# Patient Record
Sex: Female | Born: 1950
Health system: Southern US, Community
[De-identification: ages and names within clinical notes are randomized; demographics above are authoritative.]

## PROBLEM LIST (undated history)

## (undated) DIAGNOSIS — J9600 Acute respiratory failure, unspecified whether with hypoxia or hypercapnia: Secondary | ICD-10-CM

## (undated) DIAGNOSIS — E8881 Metabolic syndrome: Secondary | ICD-10-CM

## (undated) DIAGNOSIS — I10 Essential (primary) hypertension: Secondary | ICD-10-CM

## (undated) DIAGNOSIS — J189 Pneumonia, unspecified organism: Secondary | ICD-10-CM

## (undated) DIAGNOSIS — E785 Hyperlipidemia, unspecified: Secondary | ICD-10-CM

## (undated) HISTORY — DX: Essential (primary) hypertension: I10

## (undated) HISTORY — DX: Metabolic syndrome: E88.81

## (undated) HISTORY — DX: Metabolic syndrome: E88.810

## (undated) HISTORY — DX: Hyperlipidemia, unspecified: E78.5

---

## 1999-05-16 ENCOUNTER — Other Ambulatory Visit: Admission: RE | Admit: 1999-05-16 | Discharge: 1999-05-16 | Payer: Self-pay | Admitting: Family Medicine

## 2000-07-16 ENCOUNTER — Other Ambulatory Visit: Admission: RE | Admit: 2000-07-16 | Discharge: 2000-07-16 | Payer: Self-pay | Admitting: Family Medicine

## 2002-12-29 ENCOUNTER — Other Ambulatory Visit: Admission: RE | Admit: 2002-12-29 | Discharge: 2002-12-29 | Payer: Self-pay | Admitting: Family Medicine

## 2012-07-28 ENCOUNTER — Telehealth: Payer: Self-pay | Admitting: Nurse Practitioner

## 2012-08-25 ENCOUNTER — Other Ambulatory Visit: Payer: Self-pay

## 2012-08-25 MED ORDER — LISINOPRIL-HYDROCHLOROTHIAZIDE 20-12.5 MG PO TABS: 1.0000 | ORAL_TABLET | Freq: Every day | ORAL | Status: DC

## 2012-08-25 NOTE — Telephone Encounter (Signed)
Last seen 01/08/12

## 2012-09-03 ENCOUNTER — Ambulatory Visit: Payer: Self-pay | Admitting: Nurse Practitioner

## 2012-09-03 DIAGNOSIS — H6690 Otitis media, unspecified, unspecified ear: Secondary | ICD-10-CM

## 2012-09-03 DIAGNOSIS — J029 Acute pharyngitis, unspecified: Secondary | ICD-10-CM

## 2012-09-03 DIAGNOSIS — R05 Cough: Secondary | ICD-10-CM

## 2012-09-03 DIAGNOSIS — I10 Essential (primary) hypertension: Secondary | ICD-10-CM | POA: Insufficient documentation

## 2012-09-03 LAB — POCT RAPID STREP A (OFFICE): Rapid Strep A Screen: NEGATIVE

## 2012-09-03 MED ORDER — AZITHROMYCIN 250 MG PO TABS: ORAL_TABLET | ORAL | Status: DC

## 2012-09-03 MED ORDER — HYDROCODONE-HOMATROPINE 5-1.5 MG/5ML PO SYRP: 5.0000 mL | ORAL_SOLUTION | Freq: Three times a day (TID) | ORAL | Status: DC | PRN

## 2012-09-03 NOTE — Patient Instructions (Signed)

## 2012-09-03 NOTE — Telephone Encounter (Signed)
Med refilled 08/25/12

## 2012-09-03 NOTE — Progress Notes (Signed)
  Subjective:    [name], female    DOB: 03-Feb-1951, 62 y.o.   MRN: [id number]  HPI Patient speaks no english so Alexa from our office was in room to interpret for patient. She is C/O cough, congestion and headache. No OTC meds. Started 2 days ago and has gotten worse.    Review of Systems  Constitutional: Positive for fever and chills.  HENT: Positive for ear pain, congestion, sore throat, rhinorrhea, postnasal drip and sinus pressure.   Respiratory: Positive for cough (nonproductive).   Cardiovascular: Negative for chest pain, palpitations and leg swelling.  Skin: Negative.        Objective:   Physical Exam  Constitutional: She is oriented to person, place, and time. She appears well-developed and well-nourished.  HENT:  Right Ear: Hearing, external ear and ear canal normal. Tympanic membrane is erythematous.  Left Ear: Hearing, tympanic membrane, external ear and ear canal normal.  Nose: Mucosal edema and rhinorrhea present.  Mouth/Throat: Posterior oropharyngeal edema present.  Cardiovascular: Normal rate, regular rhythm and normal heart sounds.   Pulmonary/Chest: Effort normal and breath sounds normal.  Neurological: She is alert and oriented to person, place, and time.  Skin: Skin is warm.   BP 140/90  Pulse 88  Temp(Src) 100.8 F (38.2 C) (Oral)  Ht 5\' 2"  (1.575 m)  Wt 155 lb (70.308 kg)  BMI 28.34 kg/m2 Results for orders placed in visit on 09/03/12  POCT RAPID STREP A (OFFICE)      Result Value Range   Rapid Strep A Screen Negative  Negative          Assessment & Plan:   1. Sore throat   2. Hypertension   3. OM (otitis media), right   4. Cough    Meds ordered this encounter  Medications  . lisinopril-hydrochlorothiazide (PRINZIDE,ZESTORETIC) 20-12.5 MG per tablet    Sig: Take 1 tablet by mouth daily.  Marland Kitchen azithromycin (ZITHROMAX Z-PAK) 250 MG tablet    Sig: As directed    Dispense:  6 each    Refill:  0    Order Specific Question:   Supervising Provider    Answer:  Ernestina Penna [[id number]]  . HYDROcodone-homatropine (HYCODAN) 5-1.5 MG/5ML syrup    Sig: Take 5 mLs by mouth every 8 (eight) hours as needed for cough.    Dispense:  120 mL    Refill:  0    Order Specific Question:  Supervising Provider    Answer:  Ernestina Penna [[id number]]   1. Take meds as prescribed 2. Use a cool mist humidifier especially during the winter months and when heat has  been humid. 3. Use saline nose sprays frequently 4. Saline irrigations of the nose can be very helpful if done frequently.  * 4X daily for 1 week*  * Use of a nettie pot can be helpful with this. Follow directions with this* 5. Drink plenty of fluids 6. Keep thermostat turn down low 7.For any cough or congestion  Use plain Mucinex- regular strength or max strength is fine   * Children- consult with Pharmacist for dosing 8. For fever or aces or pains- take tylenol or ibuprofen appropriate for age and weight.  * for fevers greater than 101 orally you may alternate ibuprofen and tylenol every  3 hours.   Mary-Margaret Daphine Deutscher, FNP

## 2012-09-04 NOTE — Addendum Note (Signed)
Addended by: Bennie Pierini on: 09/04/2012 12:22 PM   Modules accepted: Level of Service

## 2012-09-23 ENCOUNTER — Ambulatory Visit: Payer: Self-pay | Admitting: Nurse Practitioner

## 2012-09-23 ENCOUNTER — Encounter: Payer: Self-pay | Admitting: Nurse Practitioner

## 2012-09-23 VITALS — BP 125/75 | HR 89 | Temp 98.8°F | Ht 61.0 in | Wt 153.0 lb

## 2012-09-23 DIAGNOSIS — M5432 Sciatica, left side: Secondary | ICD-10-CM

## 2012-09-23 DIAGNOSIS — R079 Chest pain, unspecified: Secondary | ICD-10-CM

## 2012-09-23 DIAGNOSIS — I10 Essential (primary) hypertension: Secondary | ICD-10-CM

## 2012-09-23 DIAGNOSIS — M5430 Sciatica, unspecified side: Secondary | ICD-10-CM

## 2012-09-23 LAB — COMPLETE METABOLIC PANEL WITH GFR
ALT: 21 U/L (ref 0–35)
AST: 23 U/L (ref 0–37)
Alkaline Phosphatase: 53 U/L (ref 39–117)
Creat: 0.7 mg/dL (ref 0.50–1.10)
Total Bilirubin: 0.3 mg/dL (ref 0.3–1.2)

## 2012-09-23 MED ORDER — LISINOPRIL-HYDROCHLOROTHIAZIDE 20-12.5 MG PO TABS: 1.0000 | ORAL_TABLET | Freq: Every day | ORAL | Status: DC

## 2012-09-23 MED ORDER — METHYLPREDNISOLONE ACETATE 80 MG/ML IJ SUSP
80.0000 mg | Freq: Once | INTRAMUSCULAR | Status: AC
  Administered 2012-09-23: 80 mg via INTRAMUSCULAR

## 2012-09-23 NOTE — Patient Instructions (Signed)
Ciática   (Sciatica)   La ciática es el dolor, debilidad, entumecimiento u hormigueo a lo largo del nervio ciático. El nervio comienza en la zona inferior de la espalda y desciende por la parte posterior de cada pierna. El nervio controla los músculos de la parte inferior de la pierna y de la zona posterior de la rodilla, y transmite la sensibilidad a la parte posterior del muslo, la pierna y la planta del pie. La ciática es un síntoma de otras afecciones médicas. Por ejemplo, un daño a los nervios o algunas enfermedades como un disco herniado o un espolón óseo en la columna vertebral, podrían dañarle o presionar en el nervio ciático. Esto causa dolor, debilidad y otras sensaciones normalmente asociadas con la ciática. Generalmente la ciática afecta sólo un lado del cuerpo.  CAUSAS   · Disco herniado o desplazado.  · Enfermedad degenerativa del disco.  · Un síndrome doloroso que compromete un músculo angosto de los glúteos (síndrome piriforme).  · Lesión o fractura pélvica.  · Embarazo.  · Tumor (casos raros).  SÍNTOMAS   Los síntomas pueden variar de leves a muy graves. Por lo general, los síntomas descienden desde la zona lumbar a las nalgas y la parte posterior de la pierna. Ellos son:   · Hormigueo leve o dolor sordo en la parte inferior de la espalda, la pierna o la cadera.  · Adormecimiento en la parte posterior de la pantorrilla o la planta del pie.  · Sensación de quemazón en la zona lumbar, la pierna o la cadera.  · Dolor agudo en la zona inferior de la espalda, la pierna o la cadera.  · Debilidad en las piernas.  · Dolor de espalda intenso que inhibe los movimientos.  Los síntomas pueden empeorar al toser, estornudar, reír o estar sentado o parado durante mucho tiempo. Además, el sobrepeso puede empeorar los síntomas.   DIAGNÓSTICO   Su médico le hará un examen físico para buscar los síntomas comunes de la ciática. Le pedirá que haga algunos movimientos o actividades que activarían el dolor del nervio  ciático. Para encontrar las causas de la ciática podrá indicarle otros estudios. Estos pueden ser:   · Análisis de sangre.  · Radiografías.  · Pruebas de diagnóstico por imágenes, como resonancia magnética o tomografía computada.  TRATAMIENTO   El tratamiento se dirige a las causas de la ciática. A veces, el tratamiento no es necesario, y el dolor y el malestar desaparecen por sí mismos. Si necesita tratamiento, su médico puede sugerir:   · Medicamentos de venta libre para aliviar el dolor.  · Medicamentos recetados, como antiinflamatorios, relajantes musculares o narcóticos.  · Aplicación de calor o hielo en la zona del dolor.  · Inyecciones de corticoides para disminuir el dolor, la irritación y la inflamación alrededor del nervio.  · Reducción de la actividad en los períodos de dolor.  · Ejercicios y estiramiento del abdomen para fortalecer y mejorar la flexibilidad de la columna vertebral. Su médico puede sugerirle perder peso si el peso extra empeora el dolor de espalda.  · Fisioterapia.  · La cirugía para eliminar lo que presiona o pincha el nervio, como un espolón óseo o parte de una hernia de disco.  INSTRUCCIONES PARA EL CUIDADO EN EL HOGAR   · Sólo tome medicamentos de venta libre o recetados para calmar el dolor o el malestar, según las indicaciones de su médico.  · Aplique hielo sobre el área dolorida durante 20 minutos 3-4 veces por día durante los   primeras 48-72 horas. Luego intente aplicar calor de la misma manera.  · Haga ejercicios, elongue o realice sus actividades habituales, si no le causan más dolor.  · Cumpla con todas las sesiones de fisioterapia, según le indique su médico.  · Cumpla con todas las visitas de control, según le indique su médico.  · No use tacones altos o zapatos que no tengan buen apoyo.  · Verifique que el colchón no sea muy blando. Un colchón firme aliviará el dolor y las molestias.  SOLICITE ATENCIÓN MÉDICA DE INMEDIATO SI:   · Pierde el control de la vejiga o del intestino  (incontinencia).  · Aumenta la debilidad en la zona inferior de la espalda, la pelvis, las nalgas o las piernas.  · Siente irritación o inflamación en la espalda.  · Tiene sensación de ardor al orinar.  · El dolor empeora cuando se acuesta o lo despierta por la noche.  · El dolor es peor del que experimentó en el pasado.  · Dura más de 4 semanas.  · Pierde peso sin motivo de manera súbita.  ASEGÚRESE DE QUE:   · Comprende estas instrucciones.  · Controlará su enfermedad.  · Solicitará ayuda de inmediato si no mejora o si empeora.  Document Released: 04/08/2005 Document Revised: 10/08/2011  Exit[employer]® Patient Information ©2014 [employer].

## 2012-09-23 NOTE — Progress Notes (Signed)
  Subjective:    [name], female    DOB: 1950-09-09, 62 y.o.   MRN: [id number]  Leg Pain  The incident occurred more than 1 week ago. There was no injury mechanism. The pain is present in the left leg. The quality of the pain is described as burning. The pain is at a severity of 8/10. The pain is moderate. Exacerbated by: sitting. She has tried nothing for the symptoms.  Hypertension This is a chronic problem. The current episode started more than 1 year ago. The problem has been resolved since onset. The problem is controlled. Associated symptoms include chest pain (every other day) and peripheral edema. Pertinent negatives include no blurred vision, headaches or shortness of breath. There are no associated agents to hypertension. Risk factors for coronary artery disease include obesity and post-menopausal state. Past treatments include ACE inhibitors and diuretics. The current treatment provides significant improvement. There are no compliance problems.       Review of Systems  Eyes: Negative for blurred vision.  Respiratory: Negative for shortness of breath.   Cardiovascular: Positive for chest pain (every other day).  Neurological: Negative for headaches.  All other systems reviewed and are negative.       Objective:   Physical Exam  Constitutional: She is oriented to person, place, and time. She appears well-developed and well-nourished.  HENT:  Nose: Nose normal.  Mouth/Throat: Oropharynx is clear and moist.  Eyes: EOM are normal.  Neck: Trachea normal, normal range of motion and full passive range of motion without pain. Neck supple. No JVD present. Carotid bruit is not present. No thyromegaly present.  Cardiovascular: Normal rate, regular rhythm, normal heart sounds and intact distal pulses.  Exam reveals no gallop and no friction rub.   No murmur heard. Pulmonary/Chest: Effort normal and breath sounds normal.  Abdominal: Soft. Bowel sounds are normal. She  exhibits no distension and no mass. There is no tenderness.  Musculoskeletal: Normal range of motion.  Lymphadenopathy:    She has no cervical adenopathy.  Neurological: She is alert and oriented to person, place, and time. She has normal reflexes.  Skin: Skin is warm and dry.  Psychiatric: She has a normal mood and affect. Her behavior is normal. Judgment and thought content normal.   BP 125/75  Pulse 89  Temp(Src) 98.8 F (37.1 C) (Oral)  Ht 5\' 1"  (1.549 m)  Wt 153 lb (69.4 kg)  BMI 28.92 kg/m2 EKG -NSR       Assessment & Plan:  1. Hypertension *Low NA+ diet** - COMPLETE METABOLIC PANEL WITH GFR - NMR Lipoprofile with Lipids - lisinopril-hydrochlorothiazide (PRINZIDE,ZESTORETIC) 20-12.5 MG per tablet; Take 1 tablet by mouth daily.  Dispense: 30 tablet; Refill: 5  2. Sciatic nerve pain, left REst  Moist heat if helps Motrin OTC - methylPREDNISolone acetate (DEPO-MEDROL) injection 80 mg; Inject 1 mL (80 mg total) into the muscle once.  3. Chest pain Keep diary of episodes- if continues will do cardiology referral - EKG 12-Lead  Mary-Margaret Daphine Deutscher, FNP

## 2012-09-24 ENCOUNTER — Other Ambulatory Visit: Payer: Self-pay | Admitting: Nurse Practitioner

## 2012-09-24 LAB — NMR LIPOPROFILE WITH LIPIDS
Cholesterol, Total: 180 mg/dL (ref <200)
HDL Particle Number: 29.3 umol/L — ABNORMAL LOW (ref >=30.5)
HDL-C: 37 mg/dL — ABNORMAL LOW (ref >=40)
LDL (calc): 116 mg/dL — ABNORMAL HIGH (ref <100)
LDL Size: 20 nm — ABNORMAL LOW (ref >20.5)
LP-IR Score: 55 — ABNORMAL HIGH (ref <=45)
VLDL Size: 45.1 nm (ref <=46.6)

## 2012-09-24 MED ORDER — SIMVASTATIN 40 MG PO TABS: 40.0000 mg | ORAL_TABLET | Freq: Every day | ORAL | Status: DC

## 2012-11-24 ENCOUNTER — Other Ambulatory Visit: Payer: Self-pay | Admitting: Nurse Practitioner

## 2012-12-02 ENCOUNTER — Encounter: Payer: Self-pay | Admitting: *Deleted

## 2013-03-26 ENCOUNTER — Ambulatory Visit (INDEPENDENT_AMBULATORY_CARE_PROVIDER_SITE_OTHER): Payer: Self-pay | Admitting: Nurse Practitioner

## 2013-03-26 VITALS — BP 145/79 | HR 88 | Temp 98.2°F | Ht 61.0 in | Wt 149.0 lb

## 2013-03-26 DIAGNOSIS — J209 Acute bronchitis, unspecified: Secondary | ICD-10-CM

## 2013-03-26 DIAGNOSIS — I10 Essential (primary) hypertension: Secondary | ICD-10-CM

## 2013-03-26 MED ORDER — AZITHROMYCIN 250 MG PO TABS: ORAL_TABLET | ORAL | Status: DC

## 2013-03-26 MED ORDER — LISINOPRIL-HYDROCHLOROTHIAZIDE 20-12.5 MG PO TABS: 1.0000 | ORAL_TABLET | Freq: Every day | ORAL | Status: DC

## 2013-03-26 MED ORDER — HYDROCODONE-HOMATROPINE 5-1.5 MG/5ML PO SYRP: 5.0000 mL | ORAL_SOLUTION | Freq: Three times a day (TID) | ORAL | Status: DC | PRN

## 2013-03-26 NOTE — Progress Notes (Signed)
   Subjective:    [name], female    DOB: [birth year], 62 y.o.   MRN: [id number]  HPI  Patient in to be seen for cold like symptoms- she does not speak english so very difficult to assess.    Review of Systems  Unable to perform ROS: Other  Constitutional: Negative for fever, chills and appetite change.  HENT: Positive for sore throat. Negative for congestion and trouble swallowing.   Respiratory: Positive for cough.   Cardiovascular: Negative.   Gastrointestinal: Negative.   All other systems reviewed and are negative.       Objective:   Physical Exam  Constitutional: She is oriented to person, place, and time. She appears well-developed and well-nourished. No distress.  HENT:  Right Ear: Hearing, tympanic membrane, external ear and ear canal normal.  Left Ear: Hearing, tympanic membrane, external ear and ear canal normal.  Nose: No mucosal edema or rhinorrhea. Right sinus exhibits no maxillary sinus tenderness and no frontal sinus tenderness. Left sinus exhibits no maxillary sinus tenderness and no frontal sinus tenderness.  Mouth/Throat: Uvula is midline, oropharynx is clear and moist and mucous membranes are normal.  Cardiovascular: Normal rate and normal heart sounds.   Pulmonary/Chest: Effort normal and breath sounds normal. She has no wheezes. She has no rales.  Deep wet cough  Abdominal: Soft.  Neurological: She is alert and oriented to person, place, and time.  Skin: Skin is warm. She is not diaphoretic.  Psychiatric: She has a normal mood and affect. Her behavior is normal. Judgment and thought content normal.    BP 145/79  Pulse 88  Temp(Src) 98.2 F (36.8 C) (Oral)  Ht 5\' 1"  (1.549 m)  Wt 149 lb (67.586 kg)  BMI 28.17 kg/m2       Assessment & Plan:   1. Acute bronchitis    Meds ordered this encounter  Medications  . azithromycin (ZITHROMAX Z-PAK) 250 MG tablet    Sig: As directed    Dispense:  6 each    Refill:  0    Order  Specific Question:  Supervising Provider    Answer:  Ernestina Penna [[id number]]  . HYDROcodone-homatropine (HYCODAN) 5-1.5 MG/5ML syrup    Sig: Take 5 mLs by mouth every 8 (eight) hours as needed for cough.    Dispense:  120 mL    Refill:  0    Order Specific Question:  Supervising Provider    Answer:  Ernestina Penna [[id number]]   Force fluids Rest RTO prn  Mary-Margaret Daphine Deutscher, FNP

## 2013-03-26 NOTE — Patient Instructions (Signed)
Bronquitis aguda  ( Acute Bronchitis)  La bronquitis es una inflamación de las vías respiratorias que se extienden desde la tráquea hasta los pulmones (bronquios). La inflamación produce la formación de mucosidad. Esto produce tos, que es el síntoma más frecuente de la bronquitis.   Cuando la bronquitis es aguda, generalmente comienza de manera súbita y desaparece luego de un par de semanas. El hábito de fumar, las alergias y el asma pueden empeorar la bronquitis. Los episodios repetidos de bronquitis pueden causar más problemas pulmonares.   CAUSAS  La causa más frecuente de bronquitis aguda es el mismo virus que produce el resfrío. El virus se propaga de persona a persona (contagioso).   SIGNOS Y SÍNTOMAS   · Tos.    · Fiebre.    · Tos con mucosidad.    · Dolores en el cuerpo.    · Congestión en el pecho.    · Escalofríos.    · Falta de aire.    · Dolor de garganta.    DIAGNÓSTICO   La bronquitis aguda en general se diagnostica con un examen físico. En algunos casos se indican otros estudios, como radiografías, para descartar otras enfermedades.   TRATAMIENTO   La bronquitis aguda generalmente desaparece en un par de semanas. Con frecuencia no es necesario realizar un tratamiento. Los medicamentos se indican para aliviar la fiebre o la tos. Generalmente no es necesario el uso de antibióticos, pero pueden indicarse en ciertas ocasiones. En algunos casos, se recomienda el uso de un inhalador para mejorar la falta de aire y controlar la tos. Un vaporizador de aire frío podrá ayudarlo a disolver las secreciones bronquiales y facilitar su eliminación.   INSTRUCCIONES PARA EL CUIDADO EN EL HOGAR  · Descanse lo suficiente.    · Beba líquidos en abundancia para mantener la orina de color claro o amarillo pálido (excepto que padezca una enfermedad que requiera la restricción de líquidos). Tome mucho líquido para disolver las secreciones y evitar la deshidratación.    · Tome sólo medicamentos de venta libre o recetados,  según las indicaciones del médico.    · Evite fumar o aspirar el humo de otros fumadores. La exposición al humo del cigarrillo o a irritantes químicos hará que la bronquitis empeore. Si fuma, considere el uso de goma de mascar o la aplicación de parches en la piel que contengan nicotina para aliviar los síntomas de abstinencia. Si deja de fumar, sus pulmones se curarán más rápido.    · Reduzca la probabilidad de otro brote de bronquitis aguda lavando sus manos con frecuencia, evitando a las personas que tengan síntomas y tratando de no tocarse las manos con la boca, la nariz o los ojos.    · Concurra a las consultas de control con su médico según las indicaciones.    SOLICITE ATENCIÓN MÉDICA SI:  Los síntomas no mejoran después de 1 semana de tratamiento.   SOLICITE ATENCIÓN MÉDICA DE INMEDIATO SI:  · Comienza a tener fiebre o escalofríos cada vez más intensos.    · Siente dolor en el pecho.    · Le falta el aire de manera preocupante.  · La flema tiene sangre.    · Se deshidrata.  · Se desmaya.  · Tiene vómitos que se repiten.  · Tiene un dolor de cabeza intenso.  ASEGÚRESE DE QUE:   · Comprende estas instrucciones.  · Controlará su afección.  · Recibirá ayuda de inmediato si no mejora o si empeora.  Document Released: 04/08/2005 Document Revised: 12/09/2012  [employer]® Patient Information ©2014 [employer].

## 2013-04-13 ENCOUNTER — Other Ambulatory Visit: Payer: Self-pay | Admitting: Nurse Practitioner

## 2013-04-20 ENCOUNTER — Other Ambulatory Visit: Payer: Self-pay | Admitting: Nurse Practitioner

## 2013-05-04 ENCOUNTER — Ambulatory Visit (INDEPENDENT_AMBULATORY_CARE_PROVIDER_SITE_OTHER): Payer: 59 | Admitting: Nurse Practitioner

## 2013-05-04 ENCOUNTER — Encounter: Payer: Self-pay | Admitting: Nurse Practitioner

## 2013-05-04 VITALS — BP 163/77 | HR 80 | Temp 98.4°F | Ht 61.0 in | Wt 148.0 lb

## 2013-05-04 DIAGNOSIS — Z13820 Encounter for screening for osteoporosis: Secondary | ICD-10-CM

## 2013-05-04 DIAGNOSIS — Z01419 Encounter for gynecological examination (general) (routine) without abnormal findings: Secondary | ICD-10-CM

## 2013-05-04 DIAGNOSIS — Z124 Encounter for screening for malignant neoplasm of cervix: Secondary | ICD-10-CM

## 2013-05-04 DIAGNOSIS — Z0000 Encounter for general adult medical examination without abnormal findings: Secondary | ICD-10-CM

## 2013-05-04 DIAGNOSIS — I10 Essential (primary) hypertension: Secondary | ICD-10-CM

## 2013-05-04 DIAGNOSIS — E785 Hyperlipidemia, unspecified: Secondary | ICD-10-CM | POA: Insufficient documentation

## 2013-05-04 LAB — POCT CBC
GRANULOCYTE PERCENT: 69.4 % (ref 37–80)
HEMATOCRIT: 40.4 % (ref 37.7–47.9)
Hemoglobin: 12.8 g/dL (ref 12.2–16.2)
Lymph, poc: 1.9 (ref 0.6–3.4)
MCH, POC: 28.2 pg (ref 27–31.2)
MCHC: 31.7 g/dL — AB (ref 31.8–35.4)
MCV: 89 fL (ref 80–97)
MPV: 9.7 fL (ref 0–99.8)
POC GRANULOCYTE: 5.8 (ref 2–6.9)
POC LYMPH %: 22.5 % (ref 10–50)
Platelet Count, POC: 134 10*3/uL — AB (ref 142–424)
RBC: 4.5 M/uL (ref 4.04–5.48)
RDW, POC: 13.3 %
WBC: 8.4 10*3/uL (ref 4.6–10.2)

## 2013-05-04 LAB — POCT URINALYSIS DIPSTICK
Bilirubin, UA: NEGATIVE
Glucose, UA: NEGATIVE
Ketones, UA: NEGATIVE
NITRITE UA: NEGATIVE
PH UA: 5
Protein, UA: NEGATIVE
SPEC GRAV UA: 1.01
UROBILINOGEN UA: NEGATIVE

## 2013-05-04 LAB — POCT UA - MICROSCOPIC ONLY
CASTS, UR, LPF, POC: NEGATIVE
Crystals, Ur, HPF, POC: NEGATIVE
MUCUS UA: NEGATIVE
Yeast, UA: NEGATIVE

## 2013-05-04 MED ORDER — LISINOPRIL-HYDROCHLOROTHIAZIDE 20-12.5 MG PO TABS: 1.0000 | ORAL_TABLET | Freq: Every day | ORAL | Status: DC

## 2013-05-04 NOTE — Progress Notes (Signed)
   Subjective:    [name], female    DOB: 04-10-1951, 63 y.o.   MRN: [id number]  HPI  Patoent here today for CPE and PAP- she has hypertension and takes lisinopril/hctz 20/12.5 daily- she also has hyperlipidemia but says she is not taking- she is very difficult to assess because she speaks no english and there is no interpreter to translate.    Review of Systems  Unable to perform ROS: Other       Objective:   Physical Exam  Constitutional: She is oriented to person, place, and time. She appears well-developed and well-nourished.  HENT:  Head: Normocephalic.  Right Ear: Hearing, tympanic membrane, external ear and ear canal normal.  Left Ear: Hearing, tympanic membrane, external ear and ear canal normal.  Nose: Nose normal.  Mouth/Throat: Uvula is midline and oropharynx is clear and moist.  Eyes: Conjunctivae and EOM are normal. Pupils are equal, round, and reactive to light.  Neck: Normal range of motion and full passive range of motion without pain. Neck supple. No JVD present. Carotid bruit is not present. No mass and no thyromegaly present.  Cardiovascular: Normal rate, normal heart sounds and intact distal pulses.   No murmur heard. Pulmonary/Chest: Effort normal and breath sounds normal. Right breast exhibits no inverted nipple, no mass, no nipple discharge, no skin change and no tenderness. Left breast exhibits no inverted nipple, no mass, no nipple discharge, no skin change and no tenderness.  Abdominal: Soft. Bowel sounds are normal. She exhibits no mass. There is no tenderness.  Genitourinary: Vagina normal and uterus normal. Guaiac negative stool. No breast swelling, tenderness, discharge or bleeding.  bimanual exam-No adnexal masses or tenderness. Cervix nonparous and pink  Musculoskeletal: Normal range of motion.  Lymphadenopathy:    She has no cervical adenopathy.  Neurological: She is alert and oriented to person, place, and time.  Skin: Skin is  warm and dry.  Psychiatric: She has a normal mood and affect. Her behavior is normal. Judgment and thought content normal.     BP 163/77  Pulse 80  Temp(Src) 98.4 F (36.9 C) (Oral)  Ht _0  (1.549 m)  Wt 148 lb (67.132 kg)  BMI 27.98 kg/m2      Assessment & Plan:   1. Annual physical exam   2. Hyperlipidemia LDL goal < 100   3. Hypertension   4. Encounter for routine gynecological examination    Orders Placed This Encounter  Procedures  . Urine culture  . CMP14+EGFR  . NMR, lipoprofile  . Thyroid Panel With TSH  . POCT UA - Microscopic Only  . POCT urinalysis dipstick  . POCT CBC   Meds ordered this encounter  Medications  . lisinopril-hydrochlorothiazide (PRINZIDE,ZESTORETIC) 20-12.5 MG per tablet    Sig: Take 1 tablet by mouth daily.    Dispense:  30 tablet    Refill:  5    Order Specific Question:  Supervising Provider    Answer:  Chipper Herb [[id number]]   Patient will make appointment for mammo Schedule bone density test Health maintenance reviewed Diet and exercise encouraged Continue all meds Follow up  In 6 months   [place], FNP

## 2013-05-04 NOTE — Patient Instructions (Signed)
Mantenimiento de Fish farm manager las mujeres (Health Maintenance, Females) Un estilo de vida saludable y los cuidados preventivos pueden favorecer la salud y [place].   Haga exmenes regulares de la salud en general, dentales y de los ojos.  Consuma una dieta saludable. Los [employer], frutas, granos enteros, productos lcteos descremados y protenas magras contienen los nutrientes que usted necesita sin necesidad de consumir muchas caloras. Disminuya el consumo de alimentos con alto contenido de grasas slidas, azcar y sal agregadas. Si es necesario, pdale informacin acerca de [place] a su mdico.  La actividad fsica regular es una de las cosas ms importantes que puede hacer por su salud. Los adultos deben hacer al menos 150 minutos de ejercicios de intensidad moderada (cualquier actividad que aumente la frecuencia cardaca y lo haga transpirar) cada semana. Adems, la [employer] de los adultos necesita ejercicios de fortalecimiento muscular 2  ms [employer].   Mantenga un peso saludable. El ndice de masa corporal [facility]) es una herramienta que identifica posibles problemas con [place]. Proporciona una estimacin de la grasa corporal basndose en el peso y la altura. El mdico podr determinar su [facility] y podr ayudarlo a Scientist, forensic o Theatre manager un peso saludable. Para los adultos de 20 aos o ms:  Un [facility] menor a 18,5 se considera bajo peso.  Un [facility] entre 18,5 y 24,9 es normal.  Un [facility] entre 25 y 29,9 es sobrepeso.  Un IMC entre 30 o ms es obesidad.  Mantenga un nivel normal de lpidos y colesterol en sangre practicando actividad fsica y minimizando la ingesta de grasas saturadas. Consuma una dieta balanceada e incluya variedad de frutas y vegetales. Los [employer] de lpidos y Research officer, trade union en sangre deben Medical laboratory scientific officer a los 60 aos y repetirse cada 5 aos. Si los niveles de colesterol son altos, tiene ms de 50 aos o tiene riesgo elevado de sufrir enfermedades cardacas,  Designer, industrial/product controlarse con ms frecuencia.Si tiene [employer] de lpidos y colesterol, debe recibir tratamiento con medicamentos, si la dieta y el ejercicio no son efectivos.  Si fuma, consulte con [employer] de las opciones para dejar de [place]. Si no lo hace, no comience.  Se recomienda realizar controles para el cncer de pulmn a los [employer] de 60 a 53 aos que tengan alto riesgo de Actor la enfermedad debido a sus antecedentes de tabaquismo. Se recomienda realizar una tomografa computarizada (TC) anual de dosis bajas en aquellas personas tienen un promedio de 30 paquetes al ao de historia de tabaquismo y en la actualidad fuman o han dejado de fumar dentro de los ltimos 15 aos. Un paquete al ao de fumador es fumar un promedio de 1 paquete de cigarrillos al da durante 1 ao (por ejemplo: 1 paquete al da durante [place] 15 aos). El control anual debe continuar hasta que el fumador haya dejado de fumar durante al menos 15 aos. Screening anual debe interrumpirse en aquellas personas que desarrollan un problema de salud que les impida seguir un tratamiento para el cncer de pulmn.  Si est embarazada no beba alcohol. Si est amamantando, beba alcohol con prudencia. Si elige beber alcohol, no se exceda de 1 medida por da. Se considera una medida a 12 onzas (355 ml) de cerveza, 5 onzas (148 ml) de vino, o 1,5 onzas (44 ml) de licor.  No comparta agujas con nadie. Pida ayuda si necesita asistencia o instrucciones con respecto a abandonar el consumo de  alcohol, cigarrillos o drogas.  La hipertensin arterial causa enfermedades cardacas y [place] el riesgo de ictus. Debe controlar su presin arterial al menos cada 1 o 2 aos. La presin arterial elevada que persiste debe tratarse con medicamentos si la prdida de peso y el ejercicio no son efectivos.  Si tiene entre 97 y 57 aos, consulte a su mdico si debe tomar aspirina para prevenir  enfermedades cardacas.  Los anlisis para la diabetes incluyen la toma de [place] de sangre para controlar el nivel de azcar en la sangre durante el [place]. Debe hacerlo cada 3 aos despus de los 65 aos si est dentro de su peso normal y sin factores de riesgo para la diabetes. Las pruebas deben comenzar a edades tempranas o llevarse a cabo con ms frecuencia si tiene sobrepeso y al menos 1 factor de riesgo para la diabetes.  Las evaluaciones para Public affairs consultant de mama son un mtodo preventivo fundamental para las mujeres. Debe practicar la "autoconciencia de las mamas". Esto significa que Chief Technology Officer apariencia normal de sus mamas y [place] y pudiendo incluir un autoexamen de Glass blower/designer. Si detecta algn cambio, no importa cun pequeo sea, debe informarlo a su mdico. Las mujeres entre 20 y 108 aos deben hacer un examen clnico de las mamas como parte del examen regular de [place], cada 1 a 3 aos. Despus de los 40 aos deben [employer]. Deben hacerse una mamografa radografa de mamas ) cada ao, comenzando a los 40 aos. Las mujeres con Editor, commissioning de cncer de mama deben hablar con el mdico para hacer un estudio gentico. Las que tienen ms riesgo deben Electrical engineer resonancia magntica y [employer] todos [place].  La evaluacin del riesgo de sufrir cncer relacionado con el cncer de mama gentico (BRCA) se recomienda en aquellas mujeres que tienen familiares con cncer relacionados con el BRCA El cncer relacionado con BRCA incluye el cncer de mama, de ovario, de trompas y peritoneal. El hecho de tener familiares con estos tipos de cncer puede asociarse con un mayor riesgo de a sufrir cambios perjudiciales (mutaciones) en los genes para el cncer de mama BRCA1 y BRCA2. Los resultados de la evaluacin determinar la necesidad de asesoramiento gentico BRCA1 y BRCA2 y [place]. Los resultados de la evaluacin determinarn la necesidad de asesoramiento gentico y  estudios para el BRCA1 y BRCA2.  Un Papanicolau se realiza para diagnosticar cncer de cuello de tero. Las mujeres deben hacerse un Papanicolau a Proofreader de los 21 aos. [employer] 21 y los 29 aos debe repetirse [place]. Luego de los 30 aos, debe realizarse un Papanicolau cada tres aos siempre que los 3 estudios anteriores sean normales. Si le han realizado una histerectoma por un problema que no era cncer u otra enfermedad que podra causar cncer, ya no necesitar un Papanicolau. Si tiene entre 45 y 36 aos y ha tenido Engineer, production normal en los ltimos 10 aos, ya no ser IT sales professional. Si ha recibido un tratamiento para Science writer cervical o para una enfermedad que podra causar cncer, Dietitian un Papanicolau y controles durante al menos 63 aos de concluir el [place]. Si no se ha Futures trader con regularidad, Chief Executive Officer a evaluarse los factores de riesgo (como el tener un nuevo compaero sexual) para Programmer, applications a Actuary. Algunas mujeres sufren problemas mdicos que aumentan la probabilidad de Optician, dispensing cervical. En estos casos, el mdico podr indicar que se  realice el Papanicolau con ms frecuencia.  La prueba del virus del papiloma humano (VPH) es un anlisis adicional que puede usarse para detectar cncer de cuello de tero. Esta prueba busca la presencia del virus que causa los cambios en el cuello. Las clulas que se recolectan durante el Papanicolau pueden usarse para el VPH. La prueba para el VPH puede usarse para evaluar a mujeres de ms de 30 aos y debe usarse en mujeres de cualquier edad cuyos resultados del Papanicolau no sean claros. Despus de los 30 aos, las mujeres deben hacerse el anlisis para el VPH con la misma frecuencia que el [place].  El cncer colorectal puede detectarse y con frecuencia puede prevenirse. La mayor parte de los estudios de rutina comienzan a los 50 aos y continan hasta los 75  aos. Sin embargo, el mdico podr aconsejarle que lo haga antes, si tiene factores de riesgo para el cncer de colon. Una vez por ao, el profesional le dar un kit de prueba para hallar sangre oculta en la materia fecal. La utilizacin de un tubo con una pequea cmara en su extremo para examinar directamente el colon (sigmoidoscopa o colonoscopa), puede detectar formas temprana de cncer colorectal. Hable con su mdico si tiene 50 aos, cuando comience con los estudios de rutina. El examen directo del colon debe repetirse cada 5 a 10 aos, hasta los 75 aos, excepto que se encuentren formas tempranas de plipos precancerosos o pequeos bultos.  Se recomienda realizar un anlisis de sangre para detectar hepatitis C a todas las personas nacidas entre 1945 y 1965, y a todo aquel que tenga un riesgo conocido de haber contrado esta enfermedad.  Practique el sexo seguro. Use condones y evite las prcticas sexuales riesgosas para disminuir el contagio de enfermedades de transmisin sexual. Las mujeres sexualmente activas de 25 aos o menos deben controlarse para descartar clamidia, que es una infeccin de transmisin sexual frecuente. Las mujeres mayores que tengan mltiples compaeros tambin deben hacerse el anlisis para detectar clamidia. Se recomienda realizar anlisis para detectar otras enfermedades de transmisin sexual si es sexualmente activa y tiene riesgos.  La osteoporosis es una enfermedad en la que los huesos pierden los minerales y la fuerza por el avance de la edad. El resultado pueden ser fracturas graves en los huesos. El riesgo de osteoporosis puede identificarse con una prueba de densidad sea. Las mujeres de ms de 65 aos y las que tengan riesgos de sufrir fracturas u osteoporosis deben pedir consejo a su mdico. Consulte a su mdico si debe tomar un suplemento de calcio o de vitamina D para reducir el riesgo de osteoporosis.  La menopausia se asocia a sntomas y riesgos fsicos. Se  dispone de una terapia de reemplazo hormonal para disminuir los sntomas y los riesgos. Consulte a su mdico para saber si la terapia de reemplazo hormonal es conveniente para usted.  Use una pantalla solar. Aplique pantalla de manera libre y repetida a lo largo del da. Pngase al resguardo del sol cuando la sombra sea ms pequea que usted. Protjase usando mangas y pantalones largos, un sombrero de ala ancha y gafas para el sol todo el ao, siempre que se encuentre en el exterior.  Informe a su mdico si aparecen nuevos lunares o los que tiene se modifican, especialmente en forma y color. Tambin notifique al mdico si un lunar es ms grande que el tamao de una goma de lpiz.  Mantngase al da con las vacunas. Document Released: 03/28/2011 Document Revised: 08/03/2012 ExitCare Patient   Information 2014 ExitCare[employer] LLC.  

## 2013-05-05 LAB — CMP14+EGFR
ALBUMIN: 4.2 g/dL (ref 3.6–4.8)
ALT: 19 IU/L (ref 0–32)
AST: 21 IU/L (ref 0–40)
Albumin/Globulin Ratio: 1.7 (ref 1.1–2.5)
Alkaline Phosphatase: 64 IU/L (ref 39–117)
BUN/Creatinine Ratio: 20 (ref 11–26)
BUN: 15 mg/dL (ref 8–27)
CALCIUM: 9.7 mg/dL (ref 8.6–10.2)
CO2: 24 mmol/L (ref 18–29)
Chloride: 100 mmol/L (ref 97–108)
Creatinine, Ser: 0.74 mg/dL (ref 0.57–1.00)
GFR, EST AFRICAN AMERICAN: 100 mL/min/{1.73_m2} (ref >59)
GFR, EST NON AFRICAN AMERICAN: 87 mL/min/{1.73_m2} (ref >59)
GLUCOSE: 94 mg/dL (ref 65–99)
Globulin, Total: 2.5 g/dL (ref 1.5–4.5)
POTASSIUM: 4 mmol/L (ref 3.5–5.2)
Sodium: 142 mmol/L (ref 134–144)
Total Bilirubin: 0.3 mg/dL (ref 0.0–1.2)
Total Protein: 6.7 g/dL (ref 6.0–8.5)

## 2013-05-05 LAB — PAP IG W/ RFLX HPV ASCU: PAP Smear Comment: 0

## 2013-05-05 LAB — THYROID PANEL WITH TSH
FREE THYROXINE INDEX: 1.7 (ref 1.2–4.9)
T3 Uptake Ratio: 23 % — ABNORMAL LOW (ref 24–39)
T4 TOTAL: 7.5 ug/dL (ref 4.5–12.0)
TSH: 3.83 u[IU]/mL (ref 0.450–4.500)

## 2013-05-05 LAB — NMR, LIPOPROFILE
CHOLESTEROL: 192 mg/dL (ref <200)
HDL CHOLESTEROL BY NMR: 41 mg/dL (ref >=40)
HDL Particle Number: 29.3 umol/L — ABNORMAL LOW (ref >=30.5)
LDL PARTICLE NUMBER: 1620 nmol/L — AB (ref <1000)
LDL Size: 20.1 nm — ABNORMAL LOW (ref >20.5)
LDLC SERPL CALC-MCNC: 118 mg/dL — ABNORMAL HIGH (ref <100)
LP-IR Score: 59 — ABNORMAL HIGH (ref <=45)
Small LDL Particle Number: 1031 nmol/L — ABNORMAL HIGH (ref <=527)
TRIGLYCERIDES BY NMR: 166 mg/dL — AB (ref <150)

## 2013-05-05 LAB — URINE CULTURE

## 2013-05-06 ENCOUNTER — Other Ambulatory Visit: Payer: Self-pay | Admitting: Nurse Practitioner

## 2013-05-06 MED ORDER — ATORVASTATIN CALCIUM 40 MG PO TABS: 40.0000 mg | ORAL_TABLET | Freq: Every day | ORAL | Status: DC

## 2013-05-13 ENCOUNTER — Ambulatory Visit (INDEPENDENT_AMBULATORY_CARE_PROVIDER_SITE_OTHER): Payer: 59 | Admitting: Nurse Practitioner

## 2013-05-13 ENCOUNTER — Encounter (INDEPENDENT_AMBULATORY_CARE_PROVIDER_SITE_OTHER): Payer: Self-pay

## 2013-05-13 ENCOUNTER — Encounter: Payer: Self-pay | Admitting: Nurse Practitioner

## 2013-05-13 VITALS — BP 172/85 | HR 82 | Temp 98.5°F | Ht 61.0 in | Wt 146.0 lb

## 2013-05-13 DIAGNOSIS — J208 Acute bronchitis due to other specified organisms: Secondary | ICD-10-CM

## 2013-05-13 DIAGNOSIS — J209 Acute bronchitis, unspecified: Secondary | ICD-10-CM

## 2013-05-13 MED ORDER — HYDROCODONE-HOMATROPINE 5-1.5 MG/5ML PO SYRP: 5.0000 mL | ORAL_SOLUTION | Freq: Three times a day (TID) | ORAL | Status: DC | PRN

## 2013-05-13 NOTE — Progress Notes (Signed)
   Subjective:    [name], female    DOB: 03-Dec-1950, 63 y.o.   MRN: [id number]  HPI Patient in c/o cough that started Tuesday- no better. Patient speaks no english and no one to interpret for us0- very difficult to assess.    Review of Systems  Constitutional: Negative for fever, chills and appetite change.  HENT: Positive for congestion.   Respiratory: Positive for cough (nonproductive).   Cardiovascular: Negative.   Gastrointestinal: Negative.   Neurological: Negative.   All other systems reviewed and are negative.       Objective:   Physical Exam  Constitutional: She is oriented to person, place, and time. She appears well-developed and well-nourished.  HENT:  Right Ear: Hearing, tympanic membrane, external ear and ear canal normal.  Left Ear: Hearing, tympanic membrane, external ear and ear canal normal.  Nose: Mucosal edema present.  Mouth/Throat: Mucous membranes are normal.  Cardiovascular: Normal rate and normal heart sounds.   Pulmonary/Chest: Effort normal and breath sounds normal.  Neurological: She is alert and oriented to person, place, and time.  Skin: Skin is warm.  Psychiatric: She has a normal mood and affect. Her behavior is normal. Judgment and thought content normal.   BP 172/85  Pulse 82  Temp(Src) 98.5 F (36.9 C) (Oral)  Ht 5\' 1"  (1.549 m)  Wt 146 lb (66.225 kg)  BMI 27.60 kg/m2        Assessment & Plan:   1. Viral bronchitis    Meds ordered this encounter  Medications  . HYDROcodone-homatropine (HYCODAN) 5-1.5 MG/5ML syrup    Sig: Take 5 mLs by mouth every 8 (eight) hours as needed for cough.    Dispense:  120 mL    Refill:  0    Order Specific Question:  Supervising Provider    Answer:  Chipper Herb [[id number]]   1. Take meds as prescribed 2. Use a cool mist humidifier especially during the winter months and when heat has been humid. 3. Use saline nose sprays frequently 4. Saline irrigations of the nose can be  very helpful if done frequently.  * 4X daily for 1 week*  * Use of a nettie pot can be helpful with this. Follow directions with this* 5. Drink plenty of fluids 6. Keep thermostat turn down low 7.For any cough or congestion  Use plain Mucinex- regular strength or max strength is fine   * Children- consult with Pharmacist for dosing 8. For fever or aces or pains- take tylenol or ibuprofen appropriate for age and weight.  * for fevers greater than 101 orally you may alternate ibuprofen and tylenol every  3 hours.   Mary-Margaret Hassell Done, FNP

## 2013-05-13 NOTE — Patient Instructions (Signed)
Bronquitis  (Bronchitis)  La bronquitis es una inflamación de las vías respiratorias que se extienden desde la tráquea hasta los pulmones (bronquios). A menudo, la inflamación produce la formación de mucosidad, lo que genera tos. Si la inflamación es grave, puede provocar falta de aire.  CAUSAS   Las causas de la bronquitis pueden ser:   · Infecciones virales.  · Bacterias.  · Humo del cigarrillo.  · Alérgenos, contaminantes y otros irritantes.  SIGNOS Y SÍNTOMAS   El síntoma más habitual de la bronquitis es la tos frecuente con mucosidad. Otros síntomas son:  · Fiebre.  · Dolores en el cuerpo.  · Congestión en el pecho.  · Escalofríos.  · Falta de aire.  · Dolor de garganta.  DIAGNÓSTICO   La bronquitis en general se diagnostica con la historia clínica y un examen físico. En algunos casos se indican otros estudios, como radiografías, para descartar otras enfermedades.   TRATAMIENTO   Tal vez deba evitar el contacto con la causa del problema (por ejemplo, el cigarrillo). En algunos casos es necesario administrar medicamentos. Estos pueden ser:  · Antibióticos. Tal vez se los receten si la causa de la bronquitis es una bacteria.  · Antitusivos. Tal vez se los receten para aliviar los síntomas de la tos.  · Medicamentos inhalados. Tal vez se los receten para liberar las vías respiratorias y facilitar la respiración.  · Medicamentos con corticoides. Tal vez se los receten si tiene bronquitis recurrente (crónica).  INSTRUCCIONES PARA EL CUIDADO EN EL HOGAR  · Descanse lo suficiente.  · Beba líquidos en abundancia para mantener la orina de color claro o amarillo pálido (excepto que padezca una enfermedad que requiera la restricción de líquidos). Tome mucho líquido para disolver las secreciones y evitar la deshidratación.  · Tome solo medicamentos de venta libre o recetados, según las indicaciones del médico.  · Tome los antibióticos exclusivamente según las indicaciones. Finalice la prescripción completa, aunque se  sienta mejor.  · Evite el humo de segunda mano, los químicos irritantes y los vapores fuertes. Estos agentes empeoran la bronquitis. Si es fumador, abandone el hábito. Considere el uso de goma de mascar o la aplicación de parches en la piel que contengan nicotina para aliviar los síntomas de abstinencia. Si deja de fumar, sus pulmones se curarán más rápido.  · Ponga un humidificador de vapor frío en la habitación por la noche para humedecer el aire. Puede ayudarlo a aflojar la mucosidad. Cambie el agua del humidificador a diario. También puede abrir el agua caliente de la ducha y sentarse en el baño con la puerta cerrada durante 5 a 10 minutos.  · Concurra a las consultas de control con su médico según las indicaciones.  · Lávese las manos con frecuencia para evitar contagiarse bronquitis nuevamente y para no extender la infección a otras personas.  SOLICITE ATENCIÓN MÉDICA SI:  Los síntomas no mejoran después de 1 semana de tratamiento.   SOLICITE ATENCIÓN MÉDICA DE INMEDIATO SI:  · La fiebre aumenta.  · Tiene escalofríos.  · Siente dolor en el pecho.  · Le empeora la falta el aire.  · La flema tiene sangre.  · Se desmaya.  · Tiene vahídos.  · Sufre un dolor intenso de cabeza.  · Vomita repetidas veces.  ASEGÚRESE DE QUE:   · Comprende estas instrucciones.  · Controlará su afección.  · Recibirá ayuda de inmediato si no mejora o si empeora.  Document Released: 04/08/2005 Document Revised: 01/27/2013  ExitCare® Patient Information ©2014 ExitCare, LLC.

## 2013-05-26 ENCOUNTER — Encounter: Payer: Self-pay | Admitting: Pharmacist

## 2013-05-26 ENCOUNTER — Ambulatory Visit (INDEPENDENT_AMBULATORY_CARE_PROVIDER_SITE_OTHER): Payer: 59 | Admitting: Pharmacist

## 2013-05-26 ENCOUNTER — Ambulatory Visit (INDEPENDENT_AMBULATORY_CARE_PROVIDER_SITE_OTHER): Payer: 59

## 2013-05-26 VITALS — Ht 60.5 in | Wt 145.5 lb

## 2013-05-26 DIAGNOSIS — Z13820 Encounter for screening for osteoporosis: Secondary | ICD-10-CM

## 2013-05-26 NOTE — Progress Notes (Signed)
[name], female   DOB: 23-Oct-1950, 63 y.o.   MRN: [id number] [facility] Current Height: Height: 5' 0.5" (153.7 cm)      Max Lifetime Height:  5\' 1"  Current Weight: Weight: 145 lb 8 oz (65.998 kg)       Ethnicity: hispanic    HPI: Does pt already have a diagnosis of:  Osteopenia?  No Osteoporosis?  No  Back Pain?  No       Kyphosis?  No Prior fracture?  Yes - ankle Med(s) for Osteoporosis/Osteopenia:  none Med(s) previously tried for Osteoporosis/Osteopenia:  none                                                             PMH: Age at menopause:  UTD Hysterectomy?  No Oophorectomy?  No HRT? No Steroid Use?  No Thyroid med?  No History of cancer?  No History of digestive disorders (ie Crohn's)?  No Current or previous eating disorders?  No Last Vitamin D Result:  50.3 (02/2010) Last GFR Result:  87 (05/04/2013)   FH/SH: Family history of osteoporosis?  No Parent with history of hip fracture?  No Family history of breast cancer?  No Exercise?  Yes   Smoking?  No Alcohol?  No    Calcium Assessment Calcium Intake  # of servings/day  Calcium mg  Milk (8 oz) 1  x  300  = 300mg   Yogurt (4 oz) 1 x  200 = 200mg   Cheese (1 oz) 0 x  200 =   Other Calcium sources   250mg   Ca supplement 0 = 0   Estimated calcium intake per day 750mg     DEXA Results Date of Test T-Score for AP Spine L1-L4 T-Score for Total Left Hip T-Score for Total Right Hip  05/26/2013 0.4 0.8 0.9  08/23/2009 0.3 0.8 0.9             Assessment: Normal BMD  Recommendations: 1.  Discussed DEXA results and fracture risk with patient and daughter 2.  recommend calcium 1200mg  daily through supplementation or diet.  3.  continue weight bearing exercise - 30 minutes at least 4 days  per week.   4.  Counseled and educated about fall risk and prevention.  Recheck DEXA:  3 years  Time spent counseling patient:  15 minutes  Cherre Robins, PharmD, CPP

## 2013-06-12 ENCOUNTER — Other Ambulatory Visit: Payer: Self-pay | Admitting: Nurse Practitioner

## 2013-08-16 ENCOUNTER — Ambulatory Visit (INDEPENDENT_AMBULATORY_CARE_PROVIDER_SITE_OTHER): Payer: 59 | Admitting: Family Medicine

## 2013-08-16 VITALS — BP 138/85 | HR 85 | Temp 98.8°F | Ht 60.5 in | Wt 148.4 lb

## 2013-08-16 DIAGNOSIS — H109 Unspecified conjunctivitis: Secondary | ICD-10-CM

## 2013-08-16 DIAGNOSIS — J209 Acute bronchitis, unspecified: Secondary | ICD-10-CM

## 2013-08-16 MED ORDER — ALBUTEROL SULFATE HFA 108 (90 BASE) MCG/ACT IN AERS: 2.0000 | INHALATION_SPRAY | Freq: Four times a day (QID) | RESPIRATORY_TRACT | Status: DC | PRN

## 2013-08-16 MED ORDER — HYDROCODONE-HOMATROPINE 5-1.5 MG/5ML PO SYRP: 5.0000 mL | ORAL_SOLUTION | Freq: Three times a day (TID) | ORAL | Status: DC | PRN

## 2013-08-16 MED ORDER — METHYLPREDNISOLONE ACETATE 80 MG/ML IJ SUSP
80.0000 mg | Freq: Once | INTRAMUSCULAR | Status: AC
  Administered 2013-08-16: 80 mg via INTRAMUSCULAR

## 2013-08-16 MED ORDER — AZITHROMYCIN 250 MG PO TABS: ORAL_TABLET | ORAL | Status: DC

## 2013-08-16 MED ORDER — POLYMYXIN B-TRIMETHOPRIM 10000-0.1 UNIT/ML-% OP SOLN: 2.0000 [drp] | OPHTHALMIC | Status: DC

## 2013-08-16 NOTE — Progress Notes (Signed)
   Subjective:    [name], female    DOB: 1950/11/07, 63 y.o.   MRN: [id number]  HPI This 63 y.o. female presents for evaluation of URI and cough.  She has been having bilateral Eye drainage and her eyes are matted shut in the am.   Review of Systems C/o eye drainage and uri sx's No chest pain, SOB, HA, dizziness, vision change, N/V, diarrhea, constipation, dysuria, urinary urgency or frequency, myalgias, arthralgias or rash.     Objective:   Physical Exam  Vital signs noted  Well developed well nourished female.  HEENT - Head atraumatic Normocephalic                Eyes - PERRLA, Conjuctiva - injected OU Sclera- Clear EOMI                Ears - EAC's Wnl TM's Wnl Gross Hearing WNL                Nose - Nares patent                 Throat - oropharanx wnl Respiratory - Lungs CTA bilateral Cardiac - RRR S1 and S2 without murmur GI - Abdomen soft Nontender and bowel sounds active x 4 Extremities - No edema. Neuro - Grossly intact.      Assessment & Plan:  Acute bronchitis - Plan: albuterol (PROVENTIL HFA;VENTOLIN HFA) 108 (90 BASE) MCG/ACT inhaler, azithromycin (ZITHROMAX Z-PAK) 250 MG tablet, HYDROcodone-homatropine (HYCODAN) 5-1.5 MG/5ML syrup, methylPREDNISolone acetate (DEPO-MEDROL) injection 80 mg  Conjunctivitis - Plan: trimethoprim-polymyxin b (POLYTRIM) ophthalmic solution  Push po fluids, rest, tylenol and motrin otc prn as directed for fever, arthralgias, and myalgias.  Follow up prn if sx's continue or persist.  Lysbeth Penner FNP

## 2013-11-01 ENCOUNTER — Encounter: Payer: Self-pay | Admitting: Nurse Practitioner

## 2013-11-01 ENCOUNTER — Ambulatory Visit (INDEPENDENT_AMBULATORY_CARE_PROVIDER_SITE_OTHER): Payer: 59 | Admitting: Nurse Practitioner

## 2013-11-01 VITALS — BP 140/80 | HR 86 | Temp 98.6°F | Ht 65.5 in | Wt 152.0 lb

## 2013-11-01 DIAGNOSIS — I10 Essential (primary) hypertension: Secondary | ICD-10-CM

## 2013-11-01 DIAGNOSIS — E785 Hyperlipidemia, unspecified: Secondary | ICD-10-CM

## 2013-11-01 MED ORDER — ATORVASTATIN CALCIUM 40 MG PO TABS: 40.0000 mg | ORAL_TABLET | Freq: Every day | ORAL | Status: DC

## 2013-11-01 MED ORDER — LISINOPRIL-HYDROCHLOROTHIAZIDE 20-12.5 MG PO TABS: 1.0000 | ORAL_TABLET | Freq: Every day | ORAL | Status: DC

## 2013-11-01 NOTE — Progress Notes (Signed)
Subjective:    [name], female    DOB: [birth year], 63 y.o.   MRN: [id number]  Hypertension This is a chronic problem. The current episode started more than 1 year ago. The problem has been resolved since onset. The problem is controlled. Associated symptoms include peripheral edema. Pertinent negatives include no blurred vision, chest pain (every other day), headaches or shortness of breath. There are no associated agents to hypertension. Risk factors for coronary artery disease include obesity and post-menopausal state. Past treatments include ACE inhibitors and diuretics. The current treatment provides significant improvement. There are no compliance problems.   Hyperlipidemia This is a chronic problem. The current episode started more than 1 year ago. The problem is uncontrolled. Recent lipid tests were reviewed and are low. She has no history of diabetes, hypothyroidism or obesity. Factors aggravating her hyperlipidemia include thiazides. Pertinent negatives include no chest pain (every other day) or shortness of breath. Current antihyperlipidemic treatment includes statins. The current treatment provides moderate improvement of lipids. Compliance problems include adherence to diet and adherence to exercise.  Risk factors for coronary artery disease include dyslipidemia, family history, hypertension and post-menopausal.      Review of Systems  Eyes: Negative for blurred vision.  Respiratory: Negative for shortness of breath.   Cardiovascular: Negative for chest pain (every other day).  Neurological: Negative for headaches.  All other systems reviewed and are negative.      Objective:   Physical Exam  Constitutional: She is oriented to person, place, and time. She appears well-developed and well-nourished.  HENT:  Nose: Nose normal.  Mouth/Throat: Oropharynx is clear and moist.  Eyes: EOM are normal.  Neck: Trachea normal, normal range of motion and full passive  range of motion without pain. Neck supple. No JVD present. Carotid bruit is not present. No thyromegaly present.  Cardiovascular: Normal rate, regular rhythm, normal heart sounds and intact distal pulses.  Exam reveals no gallop and no friction rub.   No murmur heard. Pulmonary/Chest: Effort normal and breath sounds normal.  Abdominal: Soft. Bowel sounds are normal. She exhibits no distension and no mass. There is no tenderness.  Musculoskeletal: Normal range of motion.  Lymphadenopathy:    She has no cervical adenopathy.  Neurological: She is alert and oriented to person, place, and time. She has normal reflexes.  Skin: Skin is warm and dry.  Psychiatric: She has a normal mood and affect. Her behavior is normal. Judgment and thought content normal.   BP 140/80  Pulse 86  Temp(Src) 98.6 F (37 C) (Oral)  Ht 5' 5.5" (1.664 m)  Wt 152 lb (68.947 kg)  BMI 24.90 kg/m2        Assessment & Plan:   1. Essential hypertension   2. Hyperlipidemia with target LDL less than 100    Orders Placed This Encounter  Procedures  . CMP14+EGFR  . NMR, lipoprofile   Meds ordered this encounter  Medications  . lisinopril-hydrochlorothiazide (PRINZIDE,ZESTORETIC) 20-12.5 MG per tablet    Sig: Take 1 tablet by mouth daily.    Dispense:  30 tablet    Refill:  5    Order Specific Question:  Supervising Provider    Answer:  Chipper Herb [[id number]]  . atorvastatin (LIPITOR) 40 MG tablet    Sig: Take 1 tablet (40 mg total) by mouth daily.    Dispense:  30 tablet    Refill:  3    Order Specific Question:  Supervising Provider    Answer:  Laurance Flatten,  Estella Husk [[id number]]   Patient refused hemocult cards Labs pending Health maintenance reviewed Diet and exercise encouraged Continue all meds Follow up  In 3 months   [place], FNP

## 2013-11-01 NOTE — Patient Instructions (Signed)
Mantenimiento de Fish farm manager las mujeres (Health Maintenance, Females) Un estilo de vida saludable y los cuidados preventivos pueden favorecer la salud y [place].   Haga exmenes regulares de la salud en general, dentales y de los ojos.  Consuma una dieta saludable. Los [employer] Corporation, frutas, granos enteros, productos lcteos descremados y protenas magras contienen los nutrientes que usted necesita sin necesidad de consumir muchas caloras. Disminuya el consumo de alimentos con alto contenido de grasas slidas, azcar y sal agregadas. Si es necesario, pdale informacin acerca de [place] a su mdico.  La actividad fsica regular es una de las cosas ms importantes que puede hacer por su salud. Los adultos deben hacer al menos 150 minutos de ejercicios de intensidad moderada (cualquier actividad que aumente la frecuencia cardaca y lo haga transpirar) cada semana. Adems, la State [employer] de los adultos necesita ejercicios de fortalecimiento muscular 2  ms Standard Pacific.   Mantenga un peso saludable. El ndice de masa corporal [facility]) es una herramienta que identifica posibles problemas con [place]. Proporciona una estimacin de la grasa corporal basndose en el peso y la altura. El mdico podr determinar su [facility] y podr ayudarlo a Scientist, forensic o Theatre manager un peso saludable. Para los adultos de 20 aos o ms:  Un [facility] menor a 18,5 se considera bajo peso.  Un [facility] entre 18,5 y 24,9 es normal.  Un [facility] entre 25 y 29,9 es sobrepeso.  Un IMC entre 30 o ms es obesidad.  Mantenga un nivel normal de lpidos y colesterol en sangre practicando actividad fsica y minimizando la ingesta de grasas saturadas. Consuma una dieta balanceada e incluya variedad de frutas y vegetales. Los [employer] de lpidos y Research officer, trade union en sangre deben Medical laboratory scientific officer a los 60 aos y repetirse cada 5 aos. Si los niveles de colesterol son altos, tiene ms de 50 aos o tiene riesgo elevado de sufrir enfermedades cardacas,  Designer, industrial/product controlarse con ms frecuencia.Si tiene Coca Cola de lpidos y colesterol, debe recibir tratamiento con medicamentos, si la dieta y el ejercicio no son efectivos.  Si fuma, consulte con Johnson & Johnson de las opciones para dejar de [place]. Si no lo hace, no comience.  Se recomienda realizar controles para el cncer de pulmn a los [employer] de 60 a 53 aos que tengan alto riesgo de Actor la enfermedad debido a sus antecedentes de tabaquismo. Se recomienda realizar una tomografa computarizada (TC) anual de dosis bajas en aquellas personas tienen un promedio de 30 paquetes al ao de historia de tabaquismo y en la actualidad fuman o han dejado de fumar dentro de los ltimos 15 aos. Un paquete al ao de fumador es fumar un promedio de 1 paquete de cigarrillos al da durante 1 ao (por ejemplo: 1 paquete al da durante [place] 15 aos). El control anual debe continuar hasta que el fumador haya dejado de fumar durante al menos 15 aos. Screening anual debe interrumpirse en aquellas personas que desarrollan un problema de salud que les impida seguir un tratamiento para el cncer de pulmn.  Si est embarazada no beba alcohol. Si est amamantando, beba alcohol con prudencia. Si elige beber alcohol, no se exceda de 1 medida por da. Se considera una medida a 12 onzas (355 ml) de cerveza, 5 onzas (148 ml) de vino, o 1,5 onzas (44 ml) de licor.  No comparta agujas con nadie. Pida ayuda si necesita asistencia o instrucciones con respecto a abandonar el consumo de  alcohol, cigarrillos o drogas.  La hipertensin arterial causa enfermedades cardacas y [place] el riesgo de ictus. Debe controlar su presin arterial al menos cada 1 o 2 aos. La presin arterial elevada que persiste debe tratarse con medicamentos si la prdida de peso y el ejercicio no son efectivos.  Si tiene entre 97 y 57 aos, consulte a su mdico si debe tomar aspirina para prevenir  enfermedades cardacas.  Los anlisis para la diabetes incluyen la toma de [place] de sangre para controlar el nivel de azcar en la sangre durante el [place]. Debe hacerlo cada 3 aos despus de los 65 aos si est dentro de su peso normal y sin factores de riesgo para la diabetes. Las pruebas deben comenzar a edades tempranas o llevarse a cabo con ms frecuencia si tiene sobrepeso y al menos 1 factor de riesgo para la diabetes.  Las evaluaciones para Public affairs consultant de mama son un mtodo preventivo fundamental para las mujeres. Debe practicar la "autoconciencia de las mamas". Esto significa que Chief Technology Officer apariencia normal de sus mamas y [place] y pudiendo incluir un autoexamen de Glass blower/designer. Si detecta algn cambio, no importa cun pequeo sea, debe informarlo a su mdico. Las mujeres entre 20 y 108 aos deben hacer un examen clnico de las mamas como parte del examen regular de [place], cada 1 a 3 aos. Despus de los 40 aos deben Coca-[employer]a. Deben hacerse una mamografa radografa de mamas ) cada ao, comenzando a los 40 aos. Las mujeres con Editor, commissioning de cncer de mama deben hablar con el mdico para hacer un estudio gentico. Las que tienen ms riesgo deben Electrical engineer resonancia magntica y [employer]ands todos [place].  La evaluacin del riesgo de sufrir cncer relacionado con el cncer de mama gentico (BRCA) se recomienda en aquellas mujeres que tienen familiares con cncer relacionados con el BRCA El cncer relacionado con BRCA incluye el cncer de mama, de ovario, de trompas y peritoneal. El hecho de tener familiares con estos tipos de cncer puede asociarse con un mayor riesgo de a sufrir cambios perjudiciales (mutaciones) en los genes para el cncer de mama BRCA1 y BRCA2. Los resultados de la evaluacin determinar la necesidad de asesoramiento gentico BRCA1 y BRCA2 y [place]. Los resultados de la evaluacin determinarn la necesidad de asesoramiento gentico y  estudios para el BRCA1 y BRCA2.  Un Papanicolau se realiza para diagnosticar cncer de cuello de tero. Las mujeres deben hacerse un Papanicolau a Proofreader de los 21 aos. [employer]Source 21 y los 29 aos debe repetirse [place]. Luego de los 30 aos, debe realizarse un Papanicolau cada tres aos siempre que los 3 estudios anteriores sean normales. Si le han realizado una histerectoma por un problema que no era cncer u otra enfermedad que podra causar cncer, ya no necesitar un Papanicolau. Si tiene entre 45 y 36 aos y ha tenido Engineer, production normal en los ltimos 10 aos, ya no ser IT sales professional. Si ha recibido un tratamiento para Science writer cervical o para una enfermedad que podra causar cncer, Dietitian un Papanicolau y controles durante al menos 63 aos de concluir el [place]. Si no se ha Futures trader con regularidad, Chief Executive Officer a evaluarse los factores de riesgo (como el tener un nuevo compaero sexual) para Programmer, applications a Actuary. Algunas mujeres sufren problemas mdicos que aumentan la probabilidad de Optician, dispensing cervical. En estos casos, el mdico podr indicar que se  realice el Papanicolau con ms frecuencia.  La prueba del virus del papiloma humano (VPH) es un anlisis adicional que puede usarse para detectar cncer de cuello de tero. Esta prueba busca la presencia del virus que causa los cambios en el cuello. Las clulas que se recolectan durante el Papanicolau pueden usarse para el VPH. La prueba para el VPH puede usarse para evaluar a mujeres de ms de 30 aos y debe usarse en mujeres de cualquier edad cuyos resultados del Papanicolau no sean claros. Despus de los 30 aos, las mujeres deben hacerse el anlisis para el VPH con la misma frecuencia que el [place]apanicolau.  El cncer colorectal puede detectarse y con frecuencia puede prevenirse. La mayor parte de los estudios de rutina comienzan a los 50 aos y continan hasta los 75  aos. Sin embargo, el mdico podr aconsejarle que lo haga antes, si tiene factores de riesgo para el cncer de colon. Una vez por ao, el profesional le dar un kit de prueba para hallar sangre oculta en la materia fecal. La utilizacin de un tubo con una pequea cmara en su extremo para examinar directamente el colon (sigmoidoscopa o colonoscopa), puede detectar formas temprana de cncer colorectal. Hable con su mdico si tiene 50 aos, cuando comience con los estudios de rutina. El examen directo del colon debe repetirse cada 5 a 10 aos, hasta los 75 aos, excepto que se encuentren formas tempranas de plipos precancerosos o pequeos bultos.  Se recomienda realizar un anlisis de sangre para detectar hepatitis C a todas las personas nacidas entre 1945 y 1965, y a todo aquel que tenga un riesgo conocido de haber contrado esta enfermedad.  Practique el sexo seguro. Use condones y evite las prcticas sexuales riesgosas para disminuir el contagio de enfermedades de transmisin sexual. Las mujeres sexualmente activas de 25 aos o menos deben controlarse para descartar clamidia, que es una infeccin de transmisin sexual frecuente. Las mujeres mayores que tengan mltiples compaeros tambin deben hacerse el anlisis para detectar clamidia. Se recomienda realizar anlisis para detectar otras enfermedades de transmisin sexual si es sexualmente activa y tiene riesgos.  La osteoporosis es una enfermedad en la que los huesos pierden los minerales y la fuerza por el avance de la edad. El resultado pueden ser fracturas graves en los huesos. El riesgo de osteoporosis puede identificarse con una prueba de densidad sea. Las mujeres de ms de 65 aos y las que tengan riesgos de sufrir fracturas u osteoporosis deben pedir consejo a su mdico. Consulte a su mdico si debe tomar un suplemento de calcio o de vitamina D para reducir el riesgo de osteoporosis.  La menopausia se asocia a sntomas y riesgos fsicos. Se  dispone de una terapia de reemplazo hormonal para disminuir los sntomas y los riesgos. Consulte a su mdico para saber si la terapia de reemplazo hormonal es conveniente para usted.  Use una pantalla solar. Aplique pantalla de manera libre y repetida a lo largo del da. Pngase al resguardo del sol cuando la sombra sea ms pequea que usted. Protjase usando mangas y pantalones largos, un sombrero de ala ancha y gafas para el sol todo el ao, siempre que se encuentre en el exterior.  Informe a su mdico si aparecen nuevos lunares o los que tiene se modifican, especialmente en forma y color. Tambin notifique al mdico si un lunar es ms grande que el tamao de una goma de lpiz.  Mantngase al da con las vacunas. Document Released: 03/28/2011 Document Revised: 08/03/2012 ExitCare Patient   Information 2015 [place], [place]. This information is not intended to replace advice given to you by your health care provider. Make sure you discuss any questions you have with your health care provider.

## 2013-12-16 ENCOUNTER — Telehealth: Payer: Self-pay | Admitting: Nurse Practitioner

## 2013-12-16 NOTE — Telephone Encounter (Signed)
Patient will need to be seen in October what medications does she need refilled

## 2013-12-23 ENCOUNTER — Encounter: Payer: Self-pay | Admitting: *Deleted

## 2014-02-02 ENCOUNTER — Ambulatory Visit: Payer: 59 | Admitting: Nurse Practitioner

## 2014-02-04 ENCOUNTER — Ambulatory Visit (INDEPENDENT_AMBULATORY_CARE_PROVIDER_SITE_OTHER): Payer: 59

## 2014-02-04 ENCOUNTER — Ambulatory Visit (INDEPENDENT_AMBULATORY_CARE_PROVIDER_SITE_OTHER): Payer: 59 | Admitting: Nurse Practitioner

## 2014-02-04 ENCOUNTER — Encounter: Payer: Self-pay | Admitting: Nurse Practitioner

## 2014-02-04 VITALS — BP 154/83 | HR 82 | Temp 97.3°F | Ht 65.5 in | Wt 150.6 lb

## 2014-02-04 DIAGNOSIS — R040 Epistaxis: Secondary | ICD-10-CM

## 2014-02-04 DIAGNOSIS — I10 Essential (primary) hypertension: Secondary | ICD-10-CM

## 2014-02-04 DIAGNOSIS — Z23 Encounter for immunization: Secondary | ICD-10-CM

## 2014-02-04 DIAGNOSIS — K219 Gastro-esophageal reflux disease without esophagitis: Secondary | ICD-10-CM

## 2014-02-04 DIAGNOSIS — E785 Hyperlipidemia, unspecified: Secondary | ICD-10-CM

## 2014-02-04 MED ORDER — OMEPRAZOLE 20 MG PO CPDR: 20.0000 mg | DELAYED_RELEASE_CAPSULE | Freq: Every day | ORAL | Status: DC

## 2014-02-04 NOTE — Addendum Note (Signed)
Addended by: Chevis Pretty on: 02/04/2014 04:00 PM   Modules accepted: Orders

## 2014-02-04 NOTE — Patient Instructions (Signed)

## 2014-02-04 NOTE — Progress Notes (Addendum)
Subjective:    [name], female    DOB: Mar 29, 1951, 63 y.o.   MRN: [id number]  Patient here today for follow up of chronic medical problems.   Hypertension This is a chronic problem. The current episode started more than 1 year ago. The problem has been resolved since onset. The problem is controlled. Associated symptoms include peripheral edema. Pertinent negatives include no blurred vision, chest pain (every other day), headaches or shortness of breath. There are no associated agents to hypertension. Risk factors for coronary artery disease include obesity and post-menopausal state. Past treatments include ACE inhibitors and diuretics. The current treatment provides significant improvement. There are no compliance problems.   Hyperlipidemia This is a chronic problem. The current episode started more than 1 year ago. The problem is uncontrolled. Recent lipid tests were reviewed and are low. She has no history of diabetes, hypothyroidism or obesity. Factors aggravating her hyperlipidemia include thiazides. Pertinent negatives include no chest pain (every other day) or shortness of breath. Current antihyperlipidemic treatment includes statins. The current treatment provides moderate improvement of lipids. Compliance problems include adherence to diet and adherence to exercise.  Risk factors for coronary artery disease include dyslipidemia, family history, hypertension and post-menopausal.   * c/o chest pain only when she eats * nose bleed 2-3 x a week- has been using humidifier and vaseline in nose for 6 months and is no better  Review of Systems  Eyes: Negative for blurred vision.  Respiratory: Negative for shortness of breath.   Cardiovascular: Negative for chest pain (every other day).  Neurological: Negative for headaches.  All other systems reviewed and are negative.      Objective:   Physical Exam  Constitutional: She is oriented to person, place, and time. She  appears well-developed and well-nourished.  HENT:  Nose: Nose normal.  Mouth/Throat: Oropharynx is clear and moist.  Eyes: EOM are normal.  Neck: Trachea normal, normal range of motion and full passive range of motion without pain. Neck supple. No JVD present. Carotid bruit is not present. No thyromegaly present.  Cardiovascular: Normal rate, regular rhythm, normal heart sounds and intact distal pulses.  Exam reveals no gallop and no friction rub.   No murmur heard. Pulmonary/Chest: Effort normal and breath sounds normal.  Abdominal: Soft. Bowel sounds are normal. She exhibits no distension and no mass. There is no tenderness.  Musculoskeletal: Normal range of motion.  Lymphadenopathy:    She has no cervical adenopathy.  Neurological: She is alert and oriented to person, place, and time. She has normal reflexes.  Skin: Skin is warm and dry.  Psychiatric: She has a normal mood and affect. Her behavior is normal. Judgment and thought content normal.   BP 154/83  Pulse 82  Temp(Src) 97.3 F (36.3 C) (Oral)  Ht 5' 5.5" (1.664 m)  Wt 150 lb 9.6 oz (68.312 kg)  BMI 24.67 kg/m2  EKG- NSR-Mary-Margaret Hassell Done, FNP Chest x ray- normal-Preliminary reading by Ronnald Collum, FNP  [facility]        Assessment & Plan:   1. Essential hypertension Low NA diet - DG Chest 2 View; Future - EKG 12-Lead - CMP14+EGFR  2. Hyperlipidemia with target LDL less than 100 Low fat diet - NMR, lipoprofile  3. GERD -omeprazole 32m daily Avoid spicy and fatty foods  4. Epistaxis - referral to ENT  Flu shot today Labs pending Health maintenance reviewed Diet and exercise encouraged Continue all meds Follow up  In 3 month   Mary-Margaret MHassell Done  FNP

## 2014-02-07 LAB — CMP14+EGFR
ALT: 17 IU/L (ref 0–32)
AST: 20 IU/L (ref 0–40)
Albumin/Globulin Ratio: 1.7 (ref 1.1–2.5)
Albumin: 4.5 g/dL (ref 3.6–4.8)
Alkaline Phosphatase: 78 IU/L (ref 39–117)
BUN/Creatinine Ratio: 22 (ref 11–26)
BUN: 17 mg/dL (ref 8–27)
CALCIUM: 9.3 mg/dL (ref 8.7–10.3)
CO2: 27 mmol/L (ref 18–29)
Chloride: 101 mmol/L (ref 97–108)
Creatinine, Ser: 0.77 mg/dL (ref 0.57–1.00)
GFR calc Af Amer: 95 mL/min/{1.73_m2} (ref >59)
GFR calc non Af Amer: 82 mL/min/{1.73_m2} (ref >59)
Globulin, Total: 2.6 g/dL (ref 1.5–4.5)
Glucose: 100 mg/dL — ABNORMAL HIGH (ref 65–99)
Potassium: 4.1 mmol/L (ref 3.5–5.2)
SODIUM: 142 mmol/L (ref 134–144)
Total Bilirubin: 0.3 mg/dL (ref 0.0–1.2)
Total Protein: 7.1 g/dL (ref 6.0–8.5)

## 2014-02-07 LAB — SPECIMEN STATUS REPORT

## 2014-02-07 LAB — NMR, LIPOPROFILE

## 2014-02-07 LAB — LIPID PANEL WITH LDL/HDL RATIO
CHOLESTEROL TOTAL: 137 mg/dL (ref 100–199)
HDL: 39 mg/dL — AB (ref >39)
LDL Calculated: 64 mg/dL (ref 0–99)
LDl/HDL Ratio: 1.6 ratio units (ref 0.0–3.2)
Triglycerides: 170 mg/dL — ABNORMAL HIGH (ref 0–149)
VLDL Cholesterol Cal: 34 mg/dL (ref 5–40)

## 2014-02-08 ENCOUNTER — Telehealth: Payer: Self-pay | Admitting: Family Medicine

## 2014-02-08 NOTE — Telephone Encounter (Signed)
Message copied by Waverly Ferrari on Tue Feb 08, 2014 11:34 AM ------      Message from: Chevis Pretty      Created: Tue Feb 08, 2014 10:45 AM       Kidney and liver function stable      Cholesterol looks good      Continue current meds- low fat diet and exercise and recheck in 3 months       ------

## 2014-02-09 ENCOUNTER — Telehealth: Payer: Self-pay | Admitting: *Deleted

## 2014-02-09 NOTE — Telephone Encounter (Signed)
Message copied by Shelbie Ammons on Wed Feb 09, 2014  2:37 PM ------      Message from: Chevis Pretty      Created: Tue Feb 08, 2014 10:45 AM       Kidney and liver function stable      Cholesterol looks good      Continue current meds- low fat diet and exercise and recheck in 3 months       ------

## 2014-02-09 NOTE — Telephone Encounter (Signed)
Lm,all labs good for patient.

## 2014-06-23 ENCOUNTER — Telehealth: Payer: Self-pay | Admitting: *Deleted

## 2014-06-23 DIAGNOSIS — I10 Essential (primary) hypertension: Secondary | ICD-10-CM

## 2014-06-23 MED ORDER — LISINOPRIL-HYDROCHLOROTHIAZIDE 20-12.5 MG PO TABS: 1.0000 | ORAL_TABLET | Freq: Every day | ORAL | Status: DC

## 2014-06-23 NOTE — Telephone Encounter (Signed)
done

## 2014-08-10 ENCOUNTER — Ambulatory Visit (INDEPENDENT_AMBULATORY_CARE_PROVIDER_SITE_OTHER): Payer: PRIVATE HEALTH INSURANCE | Admitting: Nurse Practitioner

## 2014-08-10 ENCOUNTER — Encounter: Payer: Self-pay | Admitting: Nurse Practitioner

## 2014-08-10 VITALS — BP 143/82 | HR 86 | Temp 97.8°F | Ht 65.5 in | Wt 152.0 lb

## 2014-08-10 DIAGNOSIS — J45909 Unspecified asthma, uncomplicated: Secondary | ICD-10-CM | POA: Insufficient documentation

## 2014-08-10 DIAGNOSIS — J4520 Mild intermittent asthma, uncomplicated: Secondary | ICD-10-CM

## 2014-08-10 DIAGNOSIS — K219 Gastro-esophageal reflux disease without esophagitis: Secondary | ICD-10-CM | POA: Diagnosis not present

## 2014-08-10 DIAGNOSIS — Z1211 Encounter for screening for malignant neoplasm of colon: Secondary | ICD-10-CM

## 2014-08-10 DIAGNOSIS — I10 Essential (primary) hypertension: Secondary | ICD-10-CM | POA: Diagnosis not present

## 2014-08-10 DIAGNOSIS — E785 Hyperlipidemia, unspecified: Secondary | ICD-10-CM | POA: Diagnosis not present

## 2014-08-10 MED ORDER — OMEPRAZOLE 20 MG PO CPDR: 20.0000 mg | DELAYED_RELEASE_CAPSULE | Freq: Every day | ORAL | Status: DC

## 2014-08-10 MED ORDER — LISINOPRIL-HYDROCHLOROTHIAZIDE 20-12.5 MG PO TABS: 1.0000 | ORAL_TABLET | Freq: Every day | ORAL | Status: DC

## 2014-08-10 MED ORDER — ATORVASTATIN CALCIUM 40 MG PO TABS: 40.0000 mg | ORAL_TABLET | Freq: Every day | ORAL | Status: DC

## 2014-08-10 NOTE — Patient Instructions (Signed)
Dieta para el control del colesterol y las grasas  (Fat and Cholesterol Control Diet) Su dieta tiene un [employer] [place] de grasa y colesterol en su sangre y rganos. El exceso de [place] y colesterol en la sangre puede afectar su:   Corazn.  Vasos sanguneos (arterias, venas).  Vescula biliar.  Hgado.  Pncreas. CONTROL DE LA GRASA Y EL COLESTEROL CON LA DIETA  Ciertos alimentos pueden subir el colesterol y 88 pueden [place]. Es importante que reemplace las grasas malas por otros tipos de [place].  No consuma:   Carnes grasas, como las salchichas y [place].  Margarina en barra y Building services engineer en tubo que tienen "aceites parcialmente hidrogenados" en ellos.  Productos horneados, como galletas dulces y saladas que tienen "aceites parcialmente hidrogenados" en ellos.  Los Walt Disney, como los aceites de coco y de [place].  Consuma los siguientes alimentos:  Cortes de peceto o lomo de carne roja.  Pollo (sin piel).  Pescado.  Ternera.  Carne de [employer] Corporation.  Mariscos.  Frutas, como manzanas.  Verduras, como el brcoli, papas y zanahorias.  Frijoles, arvejas y lentejas (legumbres).  Cereales, como [place], arroz, cuscs y trigo bulgur.  Pastas (sin salsas de crema). Busque alimentos sin grasas, descremados y bajos en colesterol. Encontrar alimentos con fibra soluble y Dentist (fitosteroles). Usted debe comer 2 gramos al da de estos alimentos.  IDENTIFIQUE LOS ALIMENTOS BAJOS EN GRASAS Y COLESTEROL   Encuentre los alimentos con fibra soluble y esteroles vegetales (fitosteroles). Debe consumir 2 gramos al da de estos alimentos. Ellos son:  Lambert Mody.  Vegetales.  Granos enteros.  Frijoles y [employer].  Frutos secos y semillas.  [place] etiquetas del paquete. Busque los alimentos bajos en grasas saturadas, libres en grasas trans, y de bajo [place].  Elija quesos que contengan slo 2 a 3 gramos de grasa saturada  por onza.  Use margarina que sea saludable para el corazn que sea [place] de grasas trans o aceites parcialmente hidrogenados.  Evite comprar productos horneados que contengan aceites parcialmente hidrogenados. En su lugar, compre productos horneados elaborados con cereales integrales (trigo integral o harina de avena integral). Evite los productos horneados en cuya etiqueta diga con "harina" o "harina enriquecida".  Compre sopas en lata que no sean cremosas, con bajo contenido de sal y sin grasas adicionadas. PREPARE SUS ALIMENTOS USTED MISMO   Cocine los alimentos hervidos, horneados, al vapor o asados. No fra los alimentos.  Utilice un spray antiadherente para cocinar.  Use limn o hierbas para condimentar comidas en lugar de usar mantequilla o margarina.  Use yogur descremados, salsas o aderezos para ensaladas con bajo contenido de [place]. BAJO EN GRASAS SATURADAS / SUSTITUTOS BAJOS EN GRASA  Carnes / grasas saturadas (g)  Evite: Bife, veteado (3 oz/85 g) / 11 g  Elija: Bife, magro(3 oz/85 g) / 4 g  Evite: [place]a (3 oz/85 g) / 7 g  Elija: [place], magra (3 oz/85 g) / 5 g  Evite: Jamn (3 oz/85 g) / 6 g  Elija: Jamn, corte magro (3 oz/85 g) / 2,4 g  Evite: Pollo con piel, carne oscura (3 oz/85 g) / 4 g  Elija: Pollo, sin piel, carne oscura (3 oz/85 g) / 2 g  Evite: Pollo con piel, carne blanca (3 oz/85 g) / 2,5 g  Elija: Pollo, sin piel, carne blanca (3 oz/85 g) / 1 g Lcteos / Grasa saturada (g)  Evite: Leche entera (1 taza) / 5 g  Elija: Steva Colder  descremada, 2% (1 taza) / 3 g  Elija: Leche descremada, 1% (1 taza) / 1,5 g  Elija: Leche descremada, 1 taza / 0,3 g  Evite: Queso duro (1 oz/28 g) / 6 g  Elija: Queso de [employer] (1 oz/28 g) / 2 a 3 g  Evite: Queso cottage, 4% de grasa (1 taza) / 6,5 g  Elija: Queso cottage bajo en grasa, 1% de grasa (1 taza) / 1,5 g  Evite: [place]ado (1 taza) / 9 g  Elija: Sorbete (1 taza) / 2,5 g  Elija: Yogur  congelado descremado (1 taza) / 0,3 g  Elija: Barra de frutas congeladas / trace  Evite: Crema batida (1 cucharada) / 3,5 g  Elija: Crema batida no lctea (1 cucharada) / 1 g Condimentos / Grasas Saturadas (g)  Evite: Mayonesa (1 cucharada) / 2 g  Elija: [place]ones[place] baja en grasa (1 cucharada) / 1 g  Evite: [place]equilla (1 cucharada) / 7 g  Elija: Margarina light extra (1 cucharada) / 1 g  Evite: Aceite de coco (1 cucharada) / 11,8 g  Elija: Aceite de oliva (1 cucharada) / 1,8 g  Elija: Aceite de maz (1 cucharada) / 1,7 g  Elija: Aceite de crtamo (1 cucharada) / 1,2 g  Elija: Aceite de girasol (1 cucharada) / 1,4 g  Elija: Aceite de soja (1 cucharada) / 2,4 g  Elija: Aceite de canola (1 cucharada) / 1 g Document Released: 10/08/2011 Document Revised: 08/03/2012 ExitCare Patient Information 2015 [place], [place]. This information is not intended to replace advice given to you by your health care provider. Make sure you discuss any questions you have with your health care provider.

## 2014-08-10 NOTE — Progress Notes (Signed)
Subjective:    [name], female    DOB: 01-Oct-1950, 64 y.o.   MRN: [id number]  Patient here today for follow up of chronic medical problems. English is not good and it is very difficult to talk with patient.   Hypertension This is a chronic problem. The current episode started more than 1 year ago. The problem is unchanged. The problem is controlled. Pertinent negatives include no chest pain, headaches or shortness of breath. Risk factors for coronary artery disease include dyslipidemia, post-menopausal state and sedentary lifestyle. Past treatments include ACE inhibitors and diuretics. The current treatment provides moderate improvement. Compliance problems include diet and exercise.   Hyperlipidemia This is a chronic problem. The problem is resistant. Recent lipid tests were reviewed and are variable. Factors aggravating her hyperlipidemia include thiazides. Pertinent negatives include no chest pain or shortness of breath. Current antihyperlipidemic treatment includes statins (Has been out of statin and has not taking in several minths- did not know that she was suppose to stay on it.). The current treatment provides moderate improvement of lipids. Compliance problems include adherence to diet and adherence to exercise.  Risk factors for coronary artery disease include dyslipidemia, hypertension and post-menopausal.  GERD Omeprazole OTC keeps symptoms under control Allergy induced asthma Patient has not need albuterol lately- doing well- no wheezing or SOB  Review of Systems  Constitutional: Negative.   HENT: Negative.   Respiratory: Negative for shortness of breath.   Cardiovascular: Negative for chest pain.  Genitourinary: Negative.   Neurological: Negative for headaches.  Psychiatric/Behavioral: Negative.   All other systems reviewed and are negative.      Objective:   Physical Exam  Constitutional: She is oriented to person, place, and time. She appears  well-developed and well-nourished.  HENT:  Nose: Nose normal.  Mouth/Throat: Oropharynx is clear and moist.  Eyes: EOM are normal.  Neck: Trachea normal, normal range of motion and full passive range of motion without pain. Neck supple. No JVD present. Carotid bruit is not present. No thyromegaly present.  Cardiovascular: Normal rate, regular rhythm, normal heart sounds and intact distal pulses.  Exam reveals no gallop and no friction rub.   No murmur heard. Pulmonary/Chest: Effort normal and breath sounds normal.  Abdominal: Soft. Bowel sounds are normal. She exhibits no distension and no mass. There is no tenderness.  Musculoskeletal: Normal range of motion.  Lymphadenopathy:    She has no cervical adenopathy.  Neurological: She is alert and oriented to person, place, and time. She has normal reflexes.  Skin: Skin is warm and dry.  Psychiatric: She has a normal mood and affect. Her behavior is normal. Judgment and thought content normal.   BP 143/82 mmHg  Pulse 86  Temp(Src) 97.8 F (36.6 C) (Oral)  Ht 5' 5.5" (1.664 m)  Wt 152 lb (68.947 kg)  BMI 24.90 kg/m2          Assessment & Plan:    1. Essential hypertension Do not add salt to diet - lisinopril-hydrochlorothiazide (PRINZIDE,ZESTORETIC) 20-12.5 MG per tablet; Take 1 tablet by mouth daily.  Dispense: 30 tablet; Refill: 1 - CMP14+EGFR  2. Hyperlipidemia with target LDL less than 100 Low fat diet - atorvastatin (LIPITOR) 40 MG tablet; Take 1 tablet (40 mg total) by mouth daily.  Dispense: 30 tablet; Refill: 3 - NMR, lipoprofile  3. Gastroesophageal reflux disease without esophagitis Avoid spicy foods Do not eat 2 hours prior to bedtime   4. Allergy-induced asthma, mild intermittent, uncomplicated  5. Encounter for  screening colonoscopy - Ambulatory referral to Gastroenterology   Will find out about zostavax Labs pending Health maintenance reviewed Diet and exercise encouraged Continue all meds Follow  up  In 3 months   [place], FNP

## 2014-08-11 LAB — CMP14+EGFR
ALT: 12 IU/L (ref 0–32)
AST: 17 IU/L (ref 0–40)
Albumin/Globulin Ratio: 1.8 (ref 1.1–2.5)
Albumin: 4.1 g/dL (ref 3.6–4.8)
Alkaline Phosphatase: 78 IU/L (ref 39–117)
BUN / CREAT RATIO: 20 (ref 11–26)
BUN: 14 mg/dL (ref 8–27)
Bilirubin Total: 0.3 mg/dL (ref 0.0–1.2)
CHLORIDE: 104 mmol/L (ref 97–108)
CO2: 25 mmol/L (ref 18–29)
Calcium: 9 mg/dL (ref 8.7–10.3)
Creatinine, Ser: 0.71 mg/dL (ref 0.57–1.00)
GFR calc Af Amer: 104 mL/min/{1.73_m2} (ref >59)
GFR calc non Af Amer: 90 mL/min/{1.73_m2} (ref >59)
GLUCOSE: 95 mg/dL (ref 65–99)
Globulin, Total: 2.3 g/dL (ref 1.5–4.5)
Potassium: 4.2 mmol/L (ref 3.5–5.2)
Sodium: 144 mmol/L (ref 134–144)
Total Protein: 6.4 g/dL (ref 6.0–8.5)

## 2014-08-11 LAB — NMR, LIPOPROFILE
CHOLESTEROL: 115 mg/dL (ref 100–199)
HDL Cholesterol by NMR: 40 mg/dL (ref >39)
HDL PARTICLE NUMBER: 30.8 umol/L (ref >=30.5)
LDL Particle Number: 703 nmol/L (ref <1000)
LDL Size: 20.2 nm (ref >20.5)
LDL-C: 53 mg/dL (ref 0–99)
LP-IR Score: 60 — ABNORMAL HIGH (ref <=45)
SMALL LDL PARTICLE NUMBER: 381 nmol/L (ref <=527)
Triglycerides by NMR: 108 mg/dL (ref 0–149)

## 2014-08-26 ENCOUNTER — Encounter: Payer: Self-pay | Admitting: Nurse Practitioner

## 2014-10-18 ENCOUNTER — Other Ambulatory Visit: Payer: Self-pay | Admitting: Nurse Practitioner

## 2014-11-09 ENCOUNTER — Encounter: Payer: Self-pay | Admitting: Nurse Practitioner

## 2014-11-09 ENCOUNTER — Ambulatory Visit (INDEPENDENT_AMBULATORY_CARE_PROVIDER_SITE_OTHER): Payer: PRIVATE HEALTH INSURANCE | Admitting: Nurse Practitioner

## 2014-11-09 VITALS — BP 138/84 | HR 79 | Temp 96.5°F | Ht 65.0 in | Wt 148.0 lb

## 2014-11-09 DIAGNOSIS — K219 Gastro-esophageal reflux disease without esophagitis: Secondary | ICD-10-CM

## 2014-11-09 DIAGNOSIS — J4520 Mild intermittent asthma, uncomplicated: Secondary | ICD-10-CM | POA: Diagnosis not present

## 2014-11-09 DIAGNOSIS — Z1212 Encounter for screening for malignant neoplasm of rectum: Secondary | ICD-10-CM | POA: Diagnosis not present

## 2014-11-09 DIAGNOSIS — E785 Hyperlipidemia, unspecified: Secondary | ICD-10-CM

## 2014-11-09 DIAGNOSIS — I10 Essential (primary) hypertension: Secondary | ICD-10-CM | POA: Diagnosis not present

## 2014-11-09 MED ORDER — OMEPRAZOLE 20 MG PO CPDR: 20.0000 mg | DELAYED_RELEASE_CAPSULE | Freq: Every day | ORAL | Status: DC

## 2014-11-09 MED ORDER — ATORVASTATIN CALCIUM 40 MG PO TABS: 40.0000 mg | ORAL_TABLET | Freq: Every day | ORAL | Status: DC

## 2014-11-09 MED ORDER — LISINOPRIL-HYDROCHLOROTHIAZIDE 20-12.5 MG PO TABS: 1.0000 | ORAL_TABLET | Freq: Every day | ORAL | Status: DC

## 2014-11-09 NOTE — Patient Instructions (Signed)
Hipertensión °(Hypertension) °La hipertensión, conocida comúnmente como presión arterial alta, se produce cuando la sangre bombea en las arterias con mucha fuerza. Las arterias son los vasos sanguíneos que transportan la sangre desde el corazón hacia todas las partes del cuerpo. Una lectura de la presión arterial consiste en un número más alto sobre un número más bajo, por ejemplo, 110/72. El número más alto (presión sistólica) corresponde a la presión interna de las arterias cuando el corazón bombea sangre. El número más bajo (presión diastólica) corresponde a la presión interna de las arterias cuando el corazón se relaja. En condiciones ideales, la presión arterial debe ser inferior a 120/80. °La hipertensión fuerza al corazón a trabajar más para bombear la sangre. Las arterias pueden estrecharse o ponerse rígidas. La hipertensión conlleva el riesgo de enfermedad cardíaca, ictus y otros problemas.  °FACTORES DE RIESGO °Algunos factores de riesgo de hipertensión son controlables, pero otros no lo son.  °Entre los factores de riesgo que usted no puede controlar, se incluyen:  °· La raza. El riesgo es mayor para las personas afroamericanas. °· La edad. Los riesgos aumentan con la edad. °· El sexo. Antes de los 45 años, los hombres corren más riesgo que las mujeres. Después de los 65 años, las mujeres corren más riesgo que los hombres. °Entre los factores de riesgo que usted puede controlar, se incluyen: °· No hacer la cantidad suficiente de actividad física o ejercicio. °· Tener sobrepeso. °· Consumir mucha grasa, azúcar, calorías o sal en la dieta. °· Beber alcohol en exceso. °SIGNOS Y SÍNTOMAS °Por lo general, la hipertensión no causa signos o síntomas. La hipertensión demasiado alta (crisis hipertensiva) puede causar dolor de cabeza, ansiedad, falta de aire y hemorragia nasal. °DIAGNÓSTICO  °Para detectar si usted tiene hipertensión, el médico le medirá la presión arterial mientras esté sentado, con el brazo  levantado a la altura del corazón. Debe medirla al menos dos veces en el mismo brazo. Determinadas condiciones pueden causar una diferencia de presión arterial entre el brazo izquierdo y el derecho. El hecho de tener una sola lectura de la presión arterial más alta que lo normal no significa que necesita un tratamiento. En el caso de tener una lectura de la presión arterial con un valor alto, pídale al médico que la verifique nuevamente. °TRATAMIENTO  °El tratamiento de la hipertensión arterial incluye hacer cambios en el estilo de vida y, posiblemente, tomar medicamentos. Un estilo de vida saludable puede ayudar a bajar la presión arterial alta. Quizá deba cambiar algunos hábitos. °Los cambios en el estilo de vida pueden incluir: °· Seguir la dieta DASH. Esta dieta tiene un alto contenido de frutas, verduras y cereales integrales. Incluye poca cantidad de sal, carnes rojas y azúcares agregados. °· Hacer al menos 2 ½ horas de actividad física enérgica todas las semanas. °· Perder peso, si es necesario. °· No fumar. °· Limitar el consumo de bebidas alcohólicas. °· Aprender formas de reducir el estrés. °Si los cambios en el estilo de vida no son suficientes para lograr controlar la presión arterial, el médico puede recetarle medicamentos. Quizá necesite tomar más de uno. Trabaje en conjunto con su médico para comprender los riesgos y los beneficios. °INSTRUCCIONES PARA EL CUIDADO EN EL HOGAR °· Haga que le midan de nuevo la presión arterial según las indicaciones del médico. °· Tome los medicamentos solamente como se lo haya indicado el médico. Siga cuidadosamente las indicaciones. Los medicamentos para la presión arterial deben tomarse según las indicaciones. Los medicamentos pierden eficacia al omitir las dosis. El hecho de omitir   las dosis también aumenta el riesgo de otros problemas. °· No fume. °· Contrólese la presión arterial en su casa según las indicaciones del médico. °SOLICITE ATENCIÓN MÉDICA SI:  °· Piensa  que tiene una reacción alérgica a los medicamentos. °· Tiene mareos o dolores de cabeza con recurrencia. °· Tiene hinchazón en los tobillos. °· Tiene problemas de visión. °SOLICITE ATENCIÓN MÉDICA DE INMEDIATO SI: °· Siente un dolor de cabeza intenso o confusión. °· Siente debilidad inusual, adormecimiento o que se desmayará. °· Siente dolor intenso en el pecho o en el abdomen. °· Vomita repetidas veces. °· Tiene dificultad para respirar. °ASEGÚRESE DE QUE:  °· Comprende estas instrucciones. °· Controlará su afección. °· Recibirá ayuda de inmediato si no mejora o si empeora. °Document Released: 04/08/2005 Document Revised: 08/23/2013 °[employer]® Patient Information ©2015 [employer]. This information is not intended to replace advice given to you by your health care provider. Make sure you discuss any questions you have with your health care provider. ° °

## 2014-11-09 NOTE — Progress Notes (Signed)
Subjective:    [name], female    DOB: [birth year], 64 y.o.   MRN: [id number]  Patient here today for follow up of chronic medical problems. English is not good and it is very difficult to talk with patient.   Hypertension This is a chronic problem. The current episode started more than 1 year ago. The problem is unchanged. The problem is controlled. Pertinent negatives include no chest pain, headaches or shortness of breath. Risk factors for coronary artery disease include dyslipidemia, post-menopausal state and sedentary lifestyle. Past treatments include ACE inhibitors and diuretics. The current treatment provides moderate improvement. Compliance problems include diet and exercise.   Hyperlipidemia This is a chronic problem. The problem is resistant. Recent lipid tests were reviewed and are variable. Factors aggravating her hyperlipidemia include thiazides. Pertinent negatives include no chest pain or shortness of breath. Current antihyperlipidemic treatment includes statins (Has been out of statin and has not taking in several minths- did not know that she was suppose to stay on it.). The current treatment provides moderate improvement of lipids. Compliance problems include adherence to diet and adherence to exercise.  Risk factors for coronary artery disease include dyslipidemia, hypertension and post-menopausal.  GERD Omeprazole OTC keeps symptoms under control Allergy induced asthma Patient has not need albuterol lately- doing well- no wheezing or SOB  Review of Systems  Constitutional: Negative.   HENT: Negative.   Respiratory: Negative for shortness of breath.   Cardiovascular: Negative for chest pain.  Genitourinary: Negative.   Neurological: Negative for headaches.  Psychiatric/Behavioral: Negative.   All other systems reviewed and are negative.      Objective:   Physical Exam  Constitutional: She is oriented to person, place, and time. She appears  well-developed and well-nourished.  HENT:  Nose: Nose normal.  Mouth/Throat: Oropharynx is clear and moist.  Eyes: EOM are normal.  Neck: Trachea normal, normal range of motion and full passive range of motion without pain. Neck supple. No JVD present. Carotid bruit is not present. No thyromegaly present.  Cardiovascular: Normal rate, regular rhythm, normal heart sounds and intact distal pulses.  Exam reveals no gallop and no friction rub.   No murmur heard. Pulmonary/Chest: Effort normal and breath sounds normal.  Abdominal: Soft. Bowel sounds are normal. She exhibits no distension and no mass. There is no tenderness.  Musculoskeletal: Normal range of motion.  Lymphadenopathy:    She has no cervical adenopathy.  Neurological: She is alert and oriented to person, place, and time. She has normal reflexes.  Skin: Skin is warm and dry.  Psychiatric: She has a normal mood and affect. Her behavior is normal. Judgment and thought content normal.   BP 138/84 mmHg  Pulse 79  Temp(Src) 96.5 F (35.8 C) (Oral)  Ht 5' 5"  (1.651 m)  Wt 148 lb (67.132 kg)  BMI 24.63 kg/m2      Assessment & Plan:   1. Essential hypertension Dash diet - lisinopril-hydrochlorothiazide (PRINZIDE,ZESTORETIC) 20-12.5 MG per tablet; Take 1 tablet by mouth daily.  Dispense: 30 tablet; Refill: 5 - CMP14+EGFR  2. Gastroesophageal reflux disease without esophagitis Avoid spicy foods Do not eat 2 hours prior to bedtime - omeprazole (PRILOSEC) 20 MG capsule; Take 1 capsule (20 mg total) by mouth daily.  Dispense: 30 capsule; Refill: 5  3. Hyperlipidemia with target LDL less than 100 Low fat diet - atorvastatin (LIPITOR) 40 MG tablet; Take 1 tablet (40 mg total) by mouth daily.  Dispense: 30 tablet; Refill: 3 - Lipid panel  4. Allergy-induced asthma, mild intermittent, uncomplicated Avoid allergens Start back on zyrtec if has flare up  5. Screening for malignant neoplasm of the rectum - Fecal occult blood,  imunochemical; Future    Labs pending Health maintenance reviewed Diet and exercise encouraged Continue all meds Follow up  In 3 months   [place], FNP

## 2014-11-10 LAB — LIPID PANEL
CHOL/HDL RATIO: 3.8 ratio (ref 0.0–4.4)
Cholesterol, Total: 115 mg/dL (ref 100–199)
HDL: 30 mg/dL — ABNORMAL LOW (ref >39)
LDL Calculated: 48 mg/dL (ref 0–99)
Triglycerides: 186 mg/dL — ABNORMAL HIGH (ref 0–149)
VLDL CHOLESTEROL CAL: 37 mg/dL (ref 5–40)

## 2014-11-10 LAB — CMP14+EGFR
ALBUMIN: 4.3 g/dL (ref 3.6–4.8)
ALT: 14 IU/L (ref 0–32)
AST: 20 IU/L (ref 0–40)
Albumin/Globulin Ratio: 1.9 (ref 1.1–2.5)
Alkaline Phosphatase: 86 IU/L (ref 39–117)
BUN/Creatinine Ratio: 19 (ref 11–26)
BUN: 14 mg/dL (ref 8–27)
Bilirubin Total: 0.3 mg/dL (ref 0.0–1.2)
CALCIUM: 9.5 mg/dL (ref 8.7–10.3)
CO2: 24 mmol/L (ref 18–29)
Chloride: 103 mmol/L (ref 97–108)
Creatinine, Ser: 0.74 mg/dL (ref 0.57–1.00)
GFR calc non Af Amer: 86 mL/min/{1.73_m2} (ref >59)
GFR, EST AFRICAN AMERICAN: 99 mL/min/{1.73_m2} (ref >59)
Globulin, Total: 2.3 g/dL (ref 1.5–4.5)
Glucose: 90 mg/dL (ref 65–99)
Potassium: 4.3 mmol/L (ref 3.5–5.2)
Sodium: 141 mmol/L (ref 134–144)
Total Protein: 6.6 g/dL (ref 6.0–8.5)

## 2014-11-14 ENCOUNTER — Other Ambulatory Visit: Payer: PRIVATE HEALTH INSURANCE

## 2014-11-14 DIAGNOSIS — Z1212 Encounter for screening for malignant neoplasm of rectum: Secondary | ICD-10-CM

## 2014-11-16 LAB — FECAL OCCULT BLOOD, IMMUNOCHEMICAL: Fecal Occult Bld: NEGATIVE

## 2015-02-09 ENCOUNTER — Ambulatory Visit (INDEPENDENT_AMBULATORY_CARE_PROVIDER_SITE_OTHER): Payer: PRIVATE HEALTH INSURANCE | Admitting: Nurse Practitioner

## 2015-02-09 ENCOUNTER — Encounter: Payer: Self-pay | Admitting: Nurse Practitioner

## 2015-02-09 VITALS — BP 135/82 | HR 89 | Temp 97.5°F | Ht 65.0 in | Wt 147.0 lb

## 2015-02-09 DIAGNOSIS — J4520 Mild intermittent asthma, uncomplicated: Secondary | ICD-10-CM | POA: Diagnosis not present

## 2015-02-09 DIAGNOSIS — E785 Hyperlipidemia, unspecified: Secondary | ICD-10-CM

## 2015-02-09 DIAGNOSIS — I10 Essential (primary) hypertension: Secondary | ICD-10-CM | POA: Diagnosis not present

## 2015-02-09 DIAGNOSIS — K219 Gastro-esophageal reflux disease without esophagitis: Secondary | ICD-10-CM | POA: Diagnosis not present

## 2015-02-09 DIAGNOSIS — Z23 Encounter for immunization: Secondary | ICD-10-CM | POA: Diagnosis not present

## 2015-02-09 MED ORDER — ATORVASTATIN CALCIUM 40 MG PO TABS: 40.0000 mg | ORAL_TABLET | Freq: Every day | ORAL | Status: DC

## 2015-02-09 NOTE — Progress Notes (Signed)
Subjective:    [name], female    DOB: January 10, 1951, 64 y.o.   MRN: [id number]  Patient here today for follow up of chronic medical problems. English is not good and it is very difficult to talk with patient.   Hypertension This is a chronic problem. The current episode started more than 1 year ago. The problem is unchanged. The problem is controlled. Pertinent negatives include no chest pain, headaches or shortness of breath. Risk factors for coronary artery disease include dyslipidemia, post-menopausal state and sedentary lifestyle. Past treatments include ACE inhibitors and diuretics. The current treatment provides moderate improvement. Compliance problems include diet and exercise.   Hyperlipidemia This is a chronic problem. The problem is resistant. Recent lipid tests were reviewed and are variable. Factors aggravating her hyperlipidemia include thiazides. Pertinent negatives include no chest pain or shortness of breath. Current antihyperlipidemic treatment includes statins (Has been out of statin and has not taking in several minths- did not know that she was suppose to stay on it.). The current treatment provides moderate improvement of lipids. Compliance problems include adherence to diet and adherence to exercise.  Risk factors for coronary artery disease include dyslipidemia, hypertension and post-menopausal.  GERD Omeprazole OTC keeps symptoms under control Allergy induced asthma Patient has not need albuterol lately- doing well- no wheezing or SOB  Review of Systems  Constitutional: Negative.   HENT: Negative.   Respiratory: Negative for shortness of breath.   Cardiovascular: Negative for chest pain.  Genitourinary: Negative.   Neurological: Negative for headaches.  Psychiatric/Behavioral: Negative.   All other systems reviewed and are negative.      Objective:   Physical Exam  Constitutional: She is oriented to person, place, and time. She appears  well-developed and well-nourished.  HENT:  Nose: Nose normal.  Mouth/Throat: Oropharynx is clear and moist.  Eyes: EOM are normal.  Neck: Trachea normal, normal range of motion and full passive range of motion without pain. Neck supple. No JVD present. Carotid bruit is not present. No thyromegaly present.  Cardiovascular: Normal rate, regular rhythm, normal heart sounds and intact distal pulses.  Exam reveals no gallop and no friction rub.   No murmur heard. Pulmonary/Chest: Effort normal and breath sounds normal.  Abdominal: Soft. Bowel sounds are normal. She exhibits no distension and no mass. There is no tenderness.  Musculoskeletal: Normal range of motion.  Lymphadenopathy:    She has no cervical adenopathy.  Neurological: She is alert and oriented to person, place, and time. She has normal reflexes.  Skin: Skin is warm and dry.  Psychiatric: She has a normal mood and affect. Her behavior is normal. Judgment and thought content normal.   BP 135/82 mmHg  Pulse 89  Temp(Src) 97.5 F (36.4 C) (Oral)  Ht 5' 5"  (1.651 m)  Wt 147 lb (66.679 kg)  BMI 24.46 kg/m2       Assessment & Plan:   1. Essential hypertension **do  Not add salt to diet* - CMP14+EGFR  2. Allergy-induced asthma, mild intermittent, uncomplicated  3. Gastroesophageal reflux disease without esophagitis Avoid spicy foods Do not eat 2 hours prior to bedtime  4. Hyperlipidemia with target LDL less than 100 Low fat diet - atorvastatin (LIPITOR) 40 MG tablet; Take 1 tablet (40 mg total) by mouth daily.  Dispense: 30 tablet; Refill: 3 - Lipid panel    Labs pending Health maintenance reviewed Diet and exercise encouraged Continue all meds Follow up  In 6 month   [place], FNP

## 2015-02-09 NOTE — Patient Instructions (Signed)
Tasha [name] (Health Maintenance, Female) Un estilo de vida saludable y los cuidados preventivos pueden favorecer considerablemente a la salud y Tasha [name]. Pregunte a su mdico cul es el cronograma de exmenes peridicos apropiado para usted. Esta es una buena oportunidad para consultarlo sobre cmo prevenir enfermedades y Tasha [name] sano. Adems de los controles, hay muchas otras cosas que puede hacer usted mismo. Los expertos han realizado numerosas investigaciones Tasha [name] cambios en el estilo de vida y las medidas de prevencin que, Tasha [name], lo ayudarn a mantenerse sano. Solicite a su mdico ms informacin. EL PESO Y LA DIETA  Consuma una dieta saludable.  Asegrese de Tasha [name] verduras, frutas, productos lcteos de bajo contenido de Tasha [name] y Advertising account planner.  No consuma muchos alimentos de alto contenido de grasas slidas, azcares agregados o sal.  Realice actividad fsica con regularidad. Esta es una de las prcticas ms importantes que puede hacer por su salud.  La mayora de los adultos deben hacer ejercicio durante al menos 173mnutos por semana. El ejercicio debe aumentar la frecuencia cardaca y pActorla transpiracin (ejercicio de iEnola.  La mayora de los adultos tambin deben hField seismologistejercicios de elongacin al mToysRusveces a la semana. Agregue esto al su plan de ejercicio de intensidad moderada. Mantenga un peso saludable.  El ndice de masa corporal ([facility] es una medida que puede utilizarse para identificar posibles problemas de pBelle Valley Proporciona una estimacin de la grasa corporal basndose en el peso y la altura. Su mdico puede ayudarle a dRadiation protection practitionerIAvenely a lScientist, forensico mTheatre managerun peso saludable.  Para las mujeres de 20aos o ms:  Un IMadera Ambulatory Endoscopy Centermenor de 18,5 se considera bajo peso.  Un IAbbeville Area Medical Centerentre 18,5 y 24,9 es normal.  Un IMethodist Hospitalentre 25 y 29,9 se considera sobrepeso.  Un IMC de 30 o ms se considera  obesidad. Observe los niveles de colesterol y lpidos en la sangre.  Debe comenzar a rEnglish as a second language teacherde lpidos y cResearch officer, trade unionen la sangre a los 20aos y luego repetirlos cada 513aos  Es posible que nAutomotive engineerlos niveles de colesterol con mayor frecuencia si:  Sus niveles de lpidos y colesterol son altos.  Es mayor de 50aos.  Presenta un alto riesgo de padecer enfermedades cardacas. DETECCIN DE CNCER  Cncer de pulmn  Se recomienda realizar exmenes de deteccin de cncer de pulmn a personas adultas entre 557y 833aos que estn en riesgo de dHorticulturist, commercialde pulmn por sus antecedentes de consumo de tabaco.  Se recomienda una tomografa computarizada de baja dosis de los pLiberty Mediaaos a las personas que:  Fuman actualmente.  Hayan dejado el hbito en algn momento en los ltimos 15aos.  Hayan fumado durante 30aos un paquete diario. Un paquete-ao equivale a fumar un promedio de un paquete de cigarrillos diario durante un ao.  Los exmenes de deteccin anuales deben continuar hasta que hayan pasado 15aos desde que dej de fumar.  Ya no debern realizarse si tiene un problema de salud que le impida recibir tratamiento para eScience writerde pulmn. Cncer de mama  Practique la autoconciencia de la mama. Esto significa reconocer la apariencia normal de sus mamas y cmo las siente.  Tambin significa realizar autoexmenes regulares de lJohnson & Johnson Informe a su mdico sobre cualquier cambio, sin importar cun pequeo sea.  Si tiene entre 20 y 31aos, un mdico debe realizarle un examen clnico de las mBrunswick Corporationparte del examen regular de sHohenwald c[place]  1 a 3aos.  Si tiene 40aos o ms, debe realizarse un examen clnico de las mamas todos los aos. Tambin considere realizarse una radiografa de las mamas (mamografa) todos los aos.  Si tiene antecedentes familiares de cncer de mama, hable con su mdico para someterse a un estudio  gentico.  Si tiene alto riesgo de padecer cncer de mama, hable con su mdico para someterse a una resonancia magntica y una mamografa todos los aos.  La evaluacin del gen del cncer de mama (BRCA) se recomienda a mujeres que tengan familiares con cnceres relacionados con el BRCA. Los cnceres relacionados con el BRCA incluyen los siguientes:  Mama.  Ovario.  Trompas.  Cnceres de peritoneo.  Los resultados de la evaluacin determinarn la necesidad de asesoramiento gentico y de anlisis de BRCA1 y BRCA2. Cncer de cuello del tero El mdico puede recomendarle que se haga pruebas peridicas de deteccin de cncer de los rganos de la pelvis (ovarios, tero y vagina). Estas pruebas incluyen un examen plvico, que abarca controlar si se produjeron cambios microscpicos en la superficie del cuello del tero (prueba de Papanicolaou). Pueden recomendarle que se haga estas pruebas cada 3aos, a partir de los 21aos.  A las mujeres que tienen entre 30 y 65aos, los mdicos pueden recomendarles que se sometan a exmenes plvicos y pruebas de Papanicolaou cada 3aos, o a la prueba de Papanicolaou y el examen plvico en combinacin con estudios de deteccin del virus del papiloma humano (VPH) cada 5aos. Algunos tipos de VPH aumentan el riesgo de padecer cncer de cuello del tero. La prueba para la deteccin del VPH tambin puede realizarse a mujeres de cualquier edad cuyos resultados de la prueba de Papanicolaou no sean claros.  Es posible que otros mdicos no recomienden exmenes de deteccin a mujeres no embarazadas que se consideran sujetos de bajo riesgo de padecer cncer de pelvis y que no tienen sntomas. Pregntele al mdico si un examen plvico de deteccin es adecuado para usted.  Si ha recibido un tratamiento para el cncer cervical o una enfermedad que podra causar cncer, necesitar realizarse una prueba de Papanicolaou y controles durante al menos 20 aos de concluido el  tratamiento. Si no se ha hecho el Papanicolaou con regularidad, debern volver a evaluarse los factores de riesgo (como tener un nuevo compaero sexual), para determinar si debe realizarse los estudios nuevamente. Algunas mujeres sufren problemas mdicos que aumentan la probabilidad de contraer cncer de cuello del tero. En estos casos, el mdico podr indicar que se realicen controles y pruebas de Papanicolaou con ms frecuencia. Cncer colorrectal  Este tipo de cncer puede detectarse y a menudo prevenirse.  Por lo general, los estudios de rutina se deben comenzar a hacer a partir de los 50 aos y hasta los 75 aos.  Sin embargo, el mdico podr aconsejarle que lo haga antes, si tiene factores de riesgo para el cncer de colon.  Tambin puede recomendarle que use un kit de prueba para hallar sangre oculta en la materia fecal.  Es posible que se use una pequea cmara en el extremo de un tubo para examinar directamente el colon (sigmoidoscopia o colonoscopia) a fin de detectar formas tempranas de cncer colorrectal.  Los exmenes de rutina generalmente comienzan a los 50aos.  El examen directo del colon se debe repetir cada 5 a 10aos hasta los 75aos. Sin embargo, es posible que se realicen exmenes con mayor frecuencia, si se detectan formas tempranas de plipos precancerosos o pequeos bultos. Cncer de piel  Revise   la piel de la cabeza a los pies con regularidad.  Informe a su mdico si aparecen nuevos lunares o los que tiene se modifican, especialmente en su forma y color.  Tambin notifique al mdico si tiene un lunar que es ms grande que el tamao de una goma de lpiz.  Siempre use pantalla solar. Aplique pantalla solar de Kerry Dory y repetida a lo largo del Training and development officer.  Protjase usando mangas y Tasha [name], un sombrero de ala ancha y gafas para el sol, siempre que se encuentre en el exterior. ENFERMEDADES CARDACAS, DIABETES E HIPERTENSIN ARTERIAL   La hipertensin  arterial causa enfermedades cardacas y [place] el riesgo de ictus. La hipertensin arterial es ms probable en los siguientes casos:  Las personas que tienen la presin arterial en el extremo del rango normal (100-139/85-89 mm Hg).  Las personas con sobrepeso u obesidad.  Las Retail banker.  Si usted tiene entre 18 y 39 aos, debe medirse la presin arterial cada 3 a 5 aos. Si usted tiene 40 aos o ms, debe medirse la presin arterial Tasha [name]. Debe medirse la presin arterial dos veces: una vez cuando est en un hospital o una clnica y la otra vez cuando est en otro sitio. Registre el promedio de [employer] [name]. Para controlar su presin arterial cuando no est en un hospital o Tasha [name], puede usar lo siguiente:  Tasha [name] mquina automtica para medir la presin arterial en una farmacia.  Un monitor para medir la presin arterial en el hogar.  Si tiene entre 33 y 105 aos, consulte a su mdico si debe tomar aspirina para prevenir el ictus.  Realcese exmenes de deteccin de la diabetes con regularidad. Esto incluye la toma de [place] de sangre para controlar el nivel de azcar en la sangre durante el [place].  Si tiene un peso normal y un bajo riesgo de padecer diabetes, realcese este anlisis cada tres aos despus de los 45aos.  Si tiene sobrepeso y un alto riesgo de padecer diabetes, considere someterse a este anlisis antes o con mayor frecuencia. PREVENCIN DE INFECCIONES  HepatitisB  Si tiene un riesgo ms alto de Museum/gallery curator hepatitis B, debe someterse a un examen de deteccin de este virus. Se considera que tiene un alto riesgo de Museum/gallery curator hepatitis B si:  Naci en un pas donde la hepatitis B es frecuente. Pregntele a su mdico qu pases son considerados de Public affairs consultant.  Sus padres nacieron en un pas de alto riesgo y usted no recibi una vacuna que lo proteja contra la hepatitis B (vacuna contra la hepatitis B).  [place]  [place] agujas  para inyectarse drogas.  Vive con alguien que tiene hepatitis B.  Ha tenido sexo con alguien que tiene hepatitis B.  Recibe tratamiento de hemodilisis.  Toma ciertos medicamentos para el cncer, trasplante de rganos y afecciones autoinmunitarias. Hepatitis C  Se recomienda un anlisis de Tasha [name] para:  Todos los que nacieron entre 1945 y [id number].  Todas las personas que tengan un riesgo de haber contrado hepatitis C. Enfermedades de transmisin sexual (ETS).  Debe realizarse pruebas de deteccin de enfermedades de transmisin sexual (ETS), incluidas gonorrea y clamidia si:  Es sexualmente activo y es menor de 24aos.  Es mayor de 24aos, y Investment banker, operational informa que corre riesgo de tener este tipo de infecciones.  La actividad sexual ha cambiado desde que le hicieron la ltima prueba de deteccin y tiene un riesgo mayor de Best boy clamidia o Radio broadcast assistant. Pregntele  al mdico si usted tiene riesgo.  Si no tiene el VIH, pero corre riesgo de infectarse por el virus, se recomienda tomar diariamente un medicamento recetado para evitar la infeccin. Esto se conoce como profilaxis previa a la exposicin. Se considera que est en riesgo si:  Es activo sexualmente y no usa preservativos habitualmente o no conoce el estado del VIH de sus parejas sexuales.  Se inyecta drogas.  Es activo sexualmente con una pareja que tiene VIH. Consulte a su mdico para saber si tiene un alto riesgo de infectarse por el VIH. Si opta por comenzar la profilaxis previa a la exposicin, primero debe realizarse anlisis de deteccin del VIH. Luego, le harn anlisis cada 3meses mientras est tomando los medicamentos para la profilaxis previa a la exposicin.  EMBARAZO   Si es premenopusica y puede quedar embarazada, solicite a su mdico asesoramiento previo a la concepcin.  Si puede quedar embarazada, tome 400 a 800microgramos (mcg) de cido flico todos los das.  Si desea evitar el embarazo, hable con su  mdico sobre el control de la natalidad (anticoncepcin). OSTEOPOROSIS Y MENOPAUSIA   La osteoporosis es una enfermedad en la que los huesos pierden los minerales y la fuerza por el avance de la edad. El resultado pueden ser fracturas graves en los huesos. El riesgo de osteoporosis puede identificarse con una prueba de densidad sea.  Si tiene 65aos o ms, o si est en riesgo de sufrir osteoporosis y fracturas, pregunte a su mdico si debe someterse a exmenes.  Consulte a su mdico si debe tomar un suplemento de calcio o de vitamina D para reducir el riesgo de osteoporosis.  La menopausia puede presentar ciertos sntomas fsicos y riesgos.  La terapia de reemplazo hormonal puede reducir algunos de estos sntomas y riesgos. Consulte a su mdico para saber si la terapia de reemplazo hormonal es conveniente para usted.  INSTRUCCIONES PARA EL CUIDADO EN EL HOGAR   Realcese los estudios de rutina de la salud, dentales y de la vista.  Mantngase al da con las vacunas.  No consuma ningn producto que contenga tabaco, lo que incluye cigarrillos, tabaco de mascar o cigarrillos electrnicos.  Si est embarazada, no beba alcohol.  Si est amamantando, reduzca el consumo de alcohol y la frecuencia con la que consume.  Si es mujer y no est embarazada limite el consumo de alcohol a no ms de 1 medida por da. Una medida equivale a 12onzas de cerveza, 5onzas de vino o 1onzas de bebidas alcohlicas de alta graduacin.  No consuma drogas.  No comparta agujas.  Solicite ayuda a su mdico si necesita apoyo o informacin para abandonar las drogas.  Informe a su mdico si a menudo se siente deprimido.  Notifique a su mdico si alguna vez ha sido vctima de abuso o si no se siente seguro en su hogar.   Esta informacin no tiene como fin reemplazar el consejo del mdico. Asegrese de hacerle al mdico cualquier pregunta que tenga.   Document Released: 03/28/2011 Document Revised:  04/29/2014 Elsevier Interactive Patient Education 2016 Elsevier Inc.  

## 2015-02-10 LAB — CMP14+EGFR
ALK PHOS: 79 IU/L (ref 39–117)
ALT: 16 IU/L (ref 0–32)
AST: 21 IU/L (ref 0–40)
Albumin/Globulin Ratio: 1.7 (ref 1.1–2.5)
Albumin: 4.3 g/dL (ref 3.6–4.8)
BUN/Creatinine Ratio: 23 (ref 11–26)
BUN: 15 mg/dL (ref 8–27)
Bilirubin Total: 0.4 mg/dL (ref 0.0–1.2)
CALCIUM: 9.4 mg/dL (ref 8.7–10.3)
CO2: 26 mmol/L (ref 18–29)
CREATININE: 0.66 mg/dL (ref 0.57–1.00)
Chloride: 101 mmol/L (ref 97–106)
GFR calc Af Amer: 108 mL/min/{1.73_m2} (ref >59)
GFR calc non Af Amer: 94 mL/min/{1.73_m2} (ref >59)
GLOBULIN, TOTAL: 2.5 g/dL (ref 1.5–4.5)
GLUCOSE: 97 mg/dL (ref 65–99)
Potassium: 4.1 mmol/L (ref 3.5–5.2)
SODIUM: 142 mmol/L (ref 136–144)
Total Protein: 6.8 g/dL (ref 6.0–8.5)

## 2015-02-10 LAB — LIPID PANEL
CHOL/HDL RATIO: 5.4 ratio — AB (ref 0.0–4.4)
CHOLESTEROL TOTAL: 163 mg/dL (ref 100–199)
HDL: 30 mg/dL — AB (ref >39)
LDL CALC: 88 mg/dL (ref 0–99)
TRIGLYCERIDES: 224 mg/dL — AB (ref 0–149)
VLDL Cholesterol Cal: 45 mg/dL — ABNORMAL HIGH (ref 5–40)

## 2015-02-13 ENCOUNTER — Encounter: Payer: Self-pay | Admitting: *Deleted

## 2015-08-10 ENCOUNTER — Encounter (INDEPENDENT_AMBULATORY_CARE_PROVIDER_SITE_OTHER): Payer: Self-pay

## 2015-08-10 ENCOUNTER — Encounter: Payer: Self-pay | Admitting: Nurse Practitioner

## 2015-08-10 ENCOUNTER — Ambulatory Visit (INDEPENDENT_AMBULATORY_CARE_PROVIDER_SITE_OTHER): Payer: Medicare Other

## 2015-08-10 ENCOUNTER — Ambulatory Visit (INDEPENDENT_AMBULATORY_CARE_PROVIDER_SITE_OTHER): Payer: Medicare Other | Admitting: Nurse Practitioner

## 2015-08-10 VITALS — BP 138/84 | HR 87 | Temp 97.3°F | Ht 65.0 in | Wt 154.6 lb

## 2015-08-10 DIAGNOSIS — K219 Gastro-esophageal reflux disease without esophagitis: Secondary | ICD-10-CM

## 2015-08-10 DIAGNOSIS — N951 Menopausal and female climacteric states: Secondary | ICD-10-CM

## 2015-08-10 DIAGNOSIS — J4520 Mild intermittent asthma, uncomplicated: Secondary | ICD-10-CM

## 2015-08-10 DIAGNOSIS — I10 Essential (primary) hypertension: Secondary | ICD-10-CM | POA: Diagnosis not present

## 2015-08-10 DIAGNOSIS — E785 Hyperlipidemia, unspecified: Secondary | ICD-10-CM | POA: Diagnosis not present

## 2015-08-10 DIAGNOSIS — Z23 Encounter for immunization: Secondary | ICD-10-CM

## 2015-08-10 MED ORDER — LISINOPRIL-HYDROCHLOROTHIAZIDE 20-12.5 MG PO TABS: 1.0000 | ORAL_TABLET | Freq: Every day | ORAL | Status: DC

## 2015-08-10 MED ORDER — OMEPRAZOLE 20 MG PO CPDR: 20.0000 mg | DELAYED_RELEASE_CAPSULE | Freq: Every day | ORAL | Status: DC

## 2015-08-10 MED ORDER — ATORVASTATIN CALCIUM 40 MG PO TABS: 40.0000 mg | ORAL_TABLET | Freq: Every day | ORAL | Status: DC

## 2015-08-10 NOTE — Patient Instructions (Signed)
Alergias (Allergies)Ketotifen eye solution Qu es este medicamento? Las gotas oculares de QUETOTIFEN se [employer] ojos para prevenir la picazn provocada por Set designer. Este medicamento puede ser utilizado para otros usos; si tiene alguna pregunta consulte con su proveedor de atencin mdica o con su farmacutico. Qu le debo informar a mi profesional de la salud antes de tomar este medicamento? Necesita saber si usted presenta alguno de los siguientes problemas o situaciones: -si [place] lentes de contacto -una reaccin alrgica o inusual al quetotifen, a otros medicamentos, alimentos, colorantes o conservantes -si est embarazada o buscando quedar embarazada -si est amamantando a un beb Cmo debo utilizar este medicamento? Este medicamento es para utilizarse [employer] ojos. No lo tome por va oral. Siga las instrucciones de la etiqueta del medicamento. Lvese las manos antes y despus de usarlo. Incline la cabeza levemente hacia atrs y lleve el prpado inferior hacia abajo con su dedo ndice para formar [employer]. Trate que la punta del gotero no toque el ojo, las yemas de los dedos o cualquier otra superficie. Aplique la cantidad de gotas recetada dentro de la bolsa. Cierre el ojo suavemente para que se esparza las gotas. Se le puede nublar la vista por algunos minutos. Utilice sus dosis a intervalos regulares. No utilice su medicamento con una frecuencia mayor que la indicada. Si est usando otros medicamentos oculares, debe usarlos por lo menos 10 minutos antes o despus de Coca-Cola. Las pomadas deben ser las ltimas aplicadas. Hable con su pediatra para informarse acerca del uso de este medicamento en nios. Puede requerir atencin especial. Aunque este medicamento ha sido recetado a nios tan menores como de 3 aos de edad para condiciones selectivas, las precauciones se aplican. Sobredosis: Pngase en contacto inmediatamente con un centro toxicolgico o una sala de  urgencia si usted cree que haya tomado demasiado medicamento. ATENCIN: [employer] Foods es solo para usted. No comparta este medicamento con nadie. Qu sucede si me olvido de una dosis? Si olvida una dosis, aplquela lo antes posible. Si es casi la hora de la prxima dosis, aplique slo esa dosis. No use dosis adicionales o dobles. Qu puede interactuar con este medicamento? No se esperan interacciones. No utilice otros productos para los ojos sin Teacher, adult education a su mdico o a su profesional de Technical sales engineer. Puede ser que esta lista no menciona todas las posibles interacciones. Informe a su profesional de [employer]	[place] de [employer] productos a base de hierbas, medicamentos de [place] o suplementos nutritivos que est tomando. Si usted fuma, consume bebidas alcohlicas o si utiliza drogas ilegales, indqueselo tambin a su profesional de KB Home	[place]. Algunas sustancias pueden interactuar con su medicamento. A qu debo estar atento al usar Coca-Cola? Si los sntomas no comienzan a mejorar a los 2  3 das, consulte a su mdico o a Barrister's clerk de Technical sales engineer. Infrmele sobre cualquier efecto secundario grave lo antes posible. Si sus ojos se inflaman, le duelen o tienen secrecin, deje de usar Coca-Cola y visite a su mdico o a su profesional de la salud tan pronto como sea posible. Puede volver a colocarse los lentes de contacto 10 minutos despus de aplicar el medicamento en el ojo. No use lentes de contacto si sus ojos estn enrojecidos. No debe usar Coca-Cola para tratar la irritacin relacionada con el uso de lentes de contacto. Qu efectos secundarios puedo tener al [employer] este medicamento? Efectos secundarios que debe informar a su mdico o a Barrister's clerk de la  salud tan pronto como sea posible: -erupcin cutnea, ronchas o picazn -hinchazn de los labios, la lengua o la cara Efectos secundarios que, por lo general, no requieren atencin mdica (debe informarlos a su mdico o a  su profesional de la salud si persisten o si son molestos): -ardor, molestias, escozor o lagrimeo inmediatamente despus de usarlo -hinchazn de los prpados o sequedad ocular -dolor de cabeza -sensibilidad de los ojos a la luz del sol Puede ser que esta lista no menciona todos los posibles efectos secundarios. Comunquese a su mdico por asesoramiento mdico [employer] Inc. Usted puede informar los efectos secundarios a la FDA por telfono al 1-800-FDA-1088. Dnde debo guardar mi medicina? Mantngala fuera del alcance de los nios. Gurdela a una temperatura de [place] 4 y 70 grados C (26 y 54 grados F). No la congele. Protjala de la luz. Deseche todo el medicamento que no haya utilizado, despus de la fecha de vencimiento. ATENCIN: Este folleto es un resumen. Puede ser que no cubra toda la posible informacin. Si usted tiene preguntas acerca de esta medicina, consulte con su mdico, su farmacutico o su profesional de Technical sales engineer.    2016, Elsevier/Gold Standard. (2014-05-31 00:00:00)  Obie Dredge es una reaccin anormal del sistema de defensa del cuerpo (sistema inmunitario) ante una sustancia. Las Lexicographer a Hotel manager. CULES SON LAS CAUSAS DE LAS ALERGIAS? La reaccin alrgica se produce cuando el sistema inmunitario, por equivocacin, reacciona ante una sustancia normalmente inocua, llamada alrgeno, como si fuera perjudicial. El sistema inmunitario libera anticuerpos para combatir la sustancia. Con el tiempo, los anticuerpos liberan una sustancia qumica llamada histamina en el torrente sanguneo. La liberacin de histamina tiene como fin proteger al cuerpo de la infeccin, pero tambin causa molestias. Cualquiera de estas acciones puede desencadenar una reaccin alrgica:  Comer un alrgeno.  Inhalar un alrgeno.  Tocar un alrgeno. CULES SON LAS CLASES DE ALERGIAS? Hay muchas clases de alergias. [place] ms frecuentes, se incluyen las  siguientes:  Water quality scientist. Por lo general, esta clase de alergia se produce por sustancias que solo estn presentes durante determinadas estaciones, por ejemplo, el moho y el polen.  Alergias a los alimentos.  Alergias a los medicamentos.  Alergias a los insectos.  Alergias a la caspa de [employer]. CULES SON LOS SNTOMAS DE LAS ALERGIAS? Entre los posibles sntomas de la [place], se incluyen los siguientes:  Hinchazn de los labios, la cara, la [place], la boca o la garganta.  Estornudos, tos o sibilancias.  Congestin nasal.  Hormigueo en la boca.  Erupcin cutnea.  Picazn.  Zonas de piel hinchadas, rojas y que producen picazn (ronchas).  Lagrimeo.  Vmitos.  Diarrea.  Mareos.  Sensacin de desvanecimiento.  Desmayos.  Problemas para respirar o tragar.  Opresin en el pecho.  Latidos cardacos rpidos. CMO SE DIAGNOSTICAN LAS ALERGIAS? Las Medtronic se diagnostican en funcin de los antecedentes mdicos y familiares, y mediante uno o ms de estos elementos:  Pruebas cutneas.  Anlisis de [place].  Un registro de alimentos. Un registro de Abbott Laboratories todos los alimentos y las bebidas que usted consume en un da, y todos los sntomas que experimenta.  Los resultados de una dieta de eliminacin. Una dieta de eliminacin implica eliminar alimentos de la dieta y luego incorporarlos nuevamente, uno a la vez, para averiguar si hay uno en particular que le cause Nurse, mental health. CMO SE TRATAN LAS ALERGIAS? No hay una cura para las alergias, pero las reacciones alrgicas pueden tratarse  con medicamentos. Generalmente, las reacciones graves deben tratarse en un hospital. [place]? La mejor manera de prevenir una reaccin alrgica es evitar la sustancia que le causa alergia. Las vacunas y los medicamentos para la alergia tambin pueden ayudar a prevenir las reacciones en [employer]. Las personas con  Chief of Staff graves pueden prevenir una reaccin potencialmente mortal llamada anafilaxis con la administracin inmediata de un medicamento despus de la exposicin al alrgeno.   Esta informacin no tiene Marine scientist el consejo del mdico. Asegrese de hacerle al mdico cualquier pregunta que tenga.   Document Released: 04/08/2005 Document Revised: 04/29/2014 Elsevier Interactive Patient Education [employer].

## 2015-08-10 NOTE — Progress Notes (Signed)
Subjective:    [name], female    DOB: 25-Sep-1950, 65 y.o.   MRN: [id number]  Patient here today for follow up of chronic medical problems. English is not good and it is very difficult to talk with patient. Having problems with allergies in her eyes, itching and burning.  Outpatient Encounter Prescriptions as of 08/10/2015  Medication Sig  . albuterol (PROVENTIL HFA;VENTOLIN HFA) 108 (90 BASE) MCG/ACT inhaler Inhale 2 puffs into the lungs every 6 (six) hours as needed for wheezing or shortness of breath.  Marland Kitchen atorvastatin (LIPITOR) 40 MG tablet Take 1 tablet (40 mg total) by mouth daily.  . cetirizine (ZYRTEC) 10 MG tablet Take 10 mg by mouth daily.  Marland Kitchen lisinopril-hydrochlorothiazide (PRINZIDE,ZESTORETIC) 20-12.5 MG tablet Take 1 tablet by mouth daily.  Marland Kitchen omeprazole (PRILOSEC) 20 MG capsule Take 1 capsule (20 mg total) by mouth daily.                 Hyperlipidemia This is a chronic problem. The problem is resistant. Recent lipid tests were reviewed and are variable. Factors aggravating her hyperlipidemia include thiazides. Pertinent negatives include no chest pain or shortness of breath. Current antihyperlipidemic treatment includes statins (Has been out of statin and has not taking in several minths- did not know that she was suppose to stay on it.). The current treatment provides moderate improvement of lipids. Compliance problems include adherence to diet and adherence to exercise.  Risk factors for coronary artery disease include dyslipidemia, hypertension and post-menopausal.  Hypertension This is a chronic problem. The current episode started more than 1 year ago. The problem is unchanged. The problem is controlled. Pertinent negatives include no chest pain, headaches or shortness of breath. Risk factors for coronary artery disease include dyslipidemia, post-menopausal state and sedentary lifestyle. Past treatments include ACE inhibitors and diuretics. The current  treatment provides moderate improvement. Compliance problems include diet and exercise.   GERD Omeprazole OTC keeps symptoms under control Allergy induced asthma Patient has not need albuterol lately- doing well- no wheezing or SOB Is taking OTC Zyrtec and has been using clear eyes eye drops for itching and burning in eyes.   Review of Systems  Constitutional: Negative.   HENT: Negative.   Eyes: Positive for redness and itching.       Having trouble with itching and burning in both eyes.   Respiratory: Negative for shortness of breath.   Cardiovascular: Negative for chest pain.  Endocrine: Negative.   Genitourinary: Negative.   Allergic/Immunologic: Positive for environmental allergies.  Neurological: Negative.  Negative for headaches.  Psychiatric/Behavioral: Negative.   All other systems reviewed and are negative.      Objective:   Physical Exam  Constitutional: She is oriented to person, place, and time. Vital signs are normal. She appears well-developed and well-nourished.  HENT:  Right Ear: Hearing, tympanic membrane, external ear and ear canal normal.  Left Ear: Hearing, tympanic membrane, external ear and ear canal normal.  Nose: Nose normal.  Mouth/Throat: Uvula is midline, oropharynx is clear and moist and mucous membranes are normal.  Eyes: EOM are normal. Pupils are equal, round, and reactive to light.  Redness in both eyes  Neck: Trachea normal, normal range of motion and full passive range of motion without pain. Neck supple. No JVD present. Carotid bruit is not present. No thyromegaly present.  Cardiovascular: Normal rate, regular rhythm, normal heart sounds, intact distal pulses and normal pulses.  Exam reveals no gallop and no friction rub.   No  murmur heard. Pulmonary/Chest: Effort normal and breath sounds normal.  Abdominal: Soft. Normal appearance and bowel sounds are normal. She exhibits no distension and no mass. There is no tenderness.  Musculoskeletal:  Normal range of motion.  Lymphadenopathy:    She has no cervical adenopathy.  Neurological: She is alert and oriented to person, place, and time. She has normal reflexes.  Skin: Skin is warm, dry and intact.  Psychiatric: She has a normal mood and affect. Her speech is normal and behavior is normal. Judgment and thought content normal. Cognition and memory are normal.  Little English. Sometimes hard to understand   BP 138/84 mmHg  Pulse 87  Temp(Src) 97.3 F (36.3 C) (Oral)  Ht 5' 5"  (1.651 m)  Wt 154 lb 9.6 oz (70.126 kg)  BMI 25.73 kg/m2 EKG- NSR-Tasha Hassell Done, FNP  Chest x ray- no cardiopulmonary disease noted-Preliminary reading by Tasha Collum, FNP  [facility]       Assessment & Plan:   1. Essential hypertension Do not add salt to diet - lisinopril-hydrochlorothiazide (PRINZIDE,ZESTORETIC) 20-12.5 MG tablet; Take 1 tablet by mouth daily.  Dispense: 30 tablet; Refill: 5 - DG Chest 2 View; Future - EKG 12-Lead - CMP14+EGFR  2. Allergy-induced asthma, mild intermittent, uncomplicated  3. Gastroesophageal reflux disease without esophagitis No spicy foods. Do not eat 2 hours before bedtime - omeprazole (PRILOSEC) 20 MG capsule; Take 1 capsule (20 mg total) by mouth daily.  Dispense: 30 capsule; Refill: 5  4. Hyperlipidemia with target LDL less than 100 Low fat diet - atorvastatin (LIPITOR) 40 MG tablet; Take 1 tablet (40 mg total) by mouth daily.  Dispense: 30 tablet; Refill: 3 - Lipid panel  5. Menopausal state - DG Bone Density; Future  EKG and Chest X-Ray today Prevnar vaccine [place] OTC gtts for eyes Continue all meds Labs pending Health Maintenance reviewed Diet and exercise encouraged RTO 6 months and PRN Tasha Loud FNP Student  Tasha [name], [place]

## 2015-08-11 LAB — LIPID PANEL
Chol/HDL Ratio: 6 ratio units — ABNORMAL HIGH (ref 0.0–4.4)
Cholesterol, Total: 199 mg/dL (ref 100–199)
HDL: 33 mg/dL — AB (ref >39)
LDL Calculated: 115 mg/dL — ABNORMAL HIGH (ref 0–99)
TRIGLYCERIDES: 256 mg/dL — AB (ref 0–149)
VLDL Cholesterol Cal: 51 mg/dL — ABNORMAL HIGH (ref 5–40)

## 2015-08-11 LAB — CMP14+EGFR
A/G RATIO: 1.6 (ref 1.2–2.2)
ALT: 22 IU/L (ref 0–32)
AST: 22 IU/L (ref 0–40)
Albumin: 4.2 g/dL (ref 3.6–4.8)
Alkaline Phosphatase: 72 IU/L (ref 39–117)
BUN/Creatinine Ratio: 23 (ref 12–28)
BUN: 16 mg/dL (ref 8–27)
CHLORIDE: 101 mmol/L (ref 96–106)
CO2: 26 mmol/L (ref 18–29)
Calcium: 9.3 mg/dL (ref 8.7–10.3)
Creatinine, Ser: 0.69 mg/dL (ref 0.57–1.00)
GFR calc Af Amer: 106 mL/min/{1.73_m2} (ref >59)
GFR calc non Af Amer: 92 mL/min/{1.73_m2} (ref >59)
Globulin, Total: 2.7 g/dL (ref 1.5–4.5)
Glucose: 93 mg/dL (ref 65–99)
POTASSIUM: 4.1 mmol/L (ref 3.5–5.2)
Sodium: 141 mmol/L (ref 134–144)
Total Protein: 6.9 g/dL (ref 6.0–8.5)

## 2015-10-02 ENCOUNTER — Ambulatory Visit (INDEPENDENT_AMBULATORY_CARE_PROVIDER_SITE_OTHER): Payer: Medicare Other | Admitting: Family Medicine

## 2015-10-02 ENCOUNTER — Encounter: Payer: Self-pay | Admitting: Family Medicine

## 2015-10-02 VITALS — BP 152/95 | HR 95 | Temp 98.9°F | Ht 65.0 in | Wt 153.8 lb

## 2015-10-02 DIAGNOSIS — H66001 Acute suppurative otitis media without spontaneous rupture of ear drum, right ear: Secondary | ICD-10-CM

## 2015-10-02 DIAGNOSIS — J029 Acute pharyngitis, unspecified: Secondary | ICD-10-CM

## 2015-10-02 DIAGNOSIS — J0110 Acute frontal sinusitis, unspecified: Secondary | ICD-10-CM

## 2015-10-02 MED ORDER — BENZONATATE 100 MG PO CAPS: 100.0000 mg | ORAL_CAPSULE | Freq: Two times a day (BID) | ORAL | Status: DC | PRN

## 2015-10-02 MED ORDER — DOXYCYCLINE HYCLATE 100 MG PO TABS: 100.0000 mg | ORAL_TABLET | Freq: Two times a day (BID) | ORAL | Status: DC

## 2015-10-02 NOTE — Progress Notes (Signed)
   HPI  Patient presents today here with acute illness.  Visit contact with the help of her grandson who functions as a Optometrist, I offered a Optometrist which she declines.  Patient's lines of the last 2 days she's had sore throat, headache, chills, congestion and cough. She also complains of right ear pain is radiating down into her neck.  She's tolerating food and fluids normally.  She denies any overt fevers but has had chills and malaise.  PMH: Smoking status noted ROS: Per HPI  Objective: BP 152/95 mmHg  Pulse 95  Temp(Src) 98.9 F (37.2 C) (Oral)  Ht 5\' 5"  (1.651 m)  Wt 153 lb 12.8 oz (69.763 kg)  BMI 25.59 kg/m2 Gen: NAD, alert, cooperative with exam HEENT: NCAT, right TM erythematous, left TM normal, bilateral frontal sinus tenderness to palpation, oropharynx clear CV: RRR, good S1/S2, no murmur Resp: CTABL, no wheezes, non-labored Ext: No edema, warm Neuro: Alert and oriented, No gross deficits  Assessment and plan:  # Acute otitis media, acute sinusitis Treat with doxy, penicillin allergic Tessalon Perles for cough Discussed that she likely has an underlying viral syndrome causing several other symptoms, may take 3-5 days to completely resolved. RTC with any concerns or worsening symptoms.    Orders Placed This Encounter  Procedures  . Rapid strep screen (not at [facility])    Meds ordered this encounter  Medications  . doxycycline (VIBRA-TABS) 100 MG tablet    Sig: Take 1 tablet (100 mg total) by mouth 2 (two) times daily. 1 po bid    Dispense:  20 tablet    Refill:  0  . benzonatate (TESSALON) 100 MG capsule    Sig: Take 1 capsule (100 mg total) by mouth 2 (two) times daily as needed for cough.    Dispense:  20 capsule    Refill:  [place], MD [facility] 10/02/2015, 5:15 PM

## 2015-10-02 NOTE — Patient Instructions (Signed)
Great to meet you!  You have an ear infection, a sinus infection, and probably a viral infection which led to the more serious bacterial infections.   Take all antibiotics, please call with any concerns  Otitis media - Adultos (Otitis Media, Adult) La otitis media es el enrojecimiento, el dolor y la inflamacin del odo [place]. La causa de la otitis media puede ser Obie [place]red[place] o, ms frecuentemente, una infeccin. Muchas veces ocurre como una complicacin de un resfro comn. SIGNOS Y SNTOMAS Los sntomas de la otitis media son:  Dolor de odos.  Cristy Hilts.  Zumbidos en el odo.  Dolor de [place].  Prdida de lquido por el odo. DIAGNSTICO Para diagnosticar la otitis media, el mdico examinar su odo con un otoscopio. Este instrumento le permite al mdico observar el interior del odo y examinar el tmpano. El mdico le preguntar acerca de sus sntomas. TRATAMIENTO  Generalmente la otitis media mejora sin tratamiento entre 3 y los 5 das. El mdico podr recetar algunos medicamentos para Glass blower/designer. Si la otitis media no mejora dentro de los 5 das o es recurrente, el mdico puede prescribir antibiticos si sospecha que la causa es una infeccin bacteriana. INSTRUCCIONES PARA EL CUIDADO EN EL HOGAR   Si le recetaron antibiticos, asegrese de terminarlos, incluso si comienza a sentirse mejor.  Tome los medicamentos solamente como se lo haya indicado el mdico.  Concurra a todas las visitas de control como se lo haya indicado el mdico. SOLICITE ATENCIN MDICA SI:  Tiene otitis media solo en un odo o sangra por la nariz, o ambas cosas.  Advierte un bulto en el cuello.  No mejora luego de 3 a 5 das.  Empeora en lugar de mejorar. SOLICITE ATENCIN MDICA DE INMEDIATO SI:   Aumenta el dolor y no puede controlarlo con Conservation officer, nature.  Observa hinchazn, irritacin o dolor alrededor del odo o rigidez en el cuello.  Nota que una parte de su rostro est  paralizada.  Nota que el hueso que se encuentra detrs de la oreja (mastoides) le duele al tocarlo. ASEGRESE DE QUE:   Comprende estas instrucciones.  Controlar su afeccin.  Recibir ayuda de inmediato si no mejora o si empeora.   Esta informacin no tiene Marine scientist el consejo del mdico. Asegrese de hacerle al mdico cualquier pregunta que tenga.   Document Released: 01/16/2005 Document Revised: 04/29/2014 Elsevier Interactive Patient Education [employer].

## 2015-10-04 LAB — RAPID STREP SCREEN (MED CTR MEBANE ONLY): Strep Gp A Ag, IA W/Reflex: NEGATIVE

## 2015-10-04 LAB — CULTURE, GROUP A STREP

## 2015-10-05 LAB — CULTURE, GROUP A STREP: STREP A CULTURE: NEGATIVE

## 2015-11-20 ENCOUNTER — Encounter: Payer: PRIVATE HEALTH INSURANCE | Admitting: *Deleted

## 2015-11-20 DIAGNOSIS — Z1231 Encounter for screening mammogram for malignant neoplasm of breast: Secondary | ICD-10-CM | POA: Diagnosis not present

## 2016-01-13 ENCOUNTER — Other Ambulatory Visit: Payer: Self-pay | Admitting: Nurse Practitioner

## 2016-01-13 DIAGNOSIS — E785 Hyperlipidemia, unspecified: Secondary | ICD-10-CM

## 2016-01-13 DIAGNOSIS — I10 Essential (primary) hypertension: Secondary | ICD-10-CM

## 2016-02-14 ENCOUNTER — Other Ambulatory Visit: Payer: Self-pay | Admitting: Nurse Practitioner

## 2016-02-14 DIAGNOSIS — K219 Gastro-esophageal reflux disease without esophagitis: Secondary | ICD-10-CM

## 2016-02-20 ENCOUNTER — Encounter: Payer: Self-pay | Admitting: Nurse Practitioner

## 2016-02-20 ENCOUNTER — Ambulatory Visit (INDEPENDENT_AMBULATORY_CARE_PROVIDER_SITE_OTHER): Payer: Medicare Other | Admitting: Nurse Practitioner

## 2016-02-20 VITALS — BP 148/88 | HR 88 | Temp 97.1°F | Ht 65.0 in | Wt 155.0 lb

## 2016-02-20 DIAGNOSIS — Z23 Encounter for immunization: Secondary | ICD-10-CM | POA: Diagnosis not present

## 2016-02-20 DIAGNOSIS — Z7289 Other problems related to lifestyle: Secondary | ICD-10-CM

## 2016-02-20 DIAGNOSIS — Z6825 Body mass index (BMI) 25.0-25.9, adult: Secondary | ICD-10-CM | POA: Diagnosis not present

## 2016-02-20 DIAGNOSIS — E785 Hyperlipidemia, unspecified: Secondary | ICD-10-CM

## 2016-02-20 DIAGNOSIS — Z13820 Encounter for screening for osteoporosis: Secondary | ICD-10-CM | POA: Diagnosis not present

## 2016-02-20 DIAGNOSIS — F1920 Other psychoactive substance dependence, uncomplicated: Secondary | ICD-10-CM

## 2016-02-20 DIAGNOSIS — K219 Gastro-esophageal reflux disease without esophagitis: Secondary | ICD-10-CM | POA: Diagnosis not present

## 2016-02-20 DIAGNOSIS — I10 Essential (primary) hypertension: Secondary | ICD-10-CM

## 2016-02-20 MED ORDER — ATORVASTATIN CALCIUM 40 MG PO TABS: 40.0000 mg | ORAL_TABLET | Freq: Every day | ORAL | 5 refills | Status: DC

## 2016-02-20 MED ORDER — LISINOPRIL-HYDROCHLOROTHIAZIDE 20-12.5 MG PO TABS: 1.0000 | ORAL_TABLET | Freq: Every day | ORAL | 5 refills | Status: DC

## 2016-02-20 MED ORDER — OMEPRAZOLE 20 MG PO CPDR: 20.0000 mg | DELAYED_RELEASE_CAPSULE | Freq: Every day | ORAL | 5 refills | Status: DC

## 2016-02-20 NOTE — Progress Notes (Signed)
Subjective:    [name], female    DOB: 16-Aug-1950, 65 y.o.   MRN: [id number]  Patient here today for follow up of chronic medical problems. English is not good and it is very difficult to talk with patient. Son in to interpret.  * PATIENT HAS NOT TAKEN BLOOD PRESSURE MEND THIS MORNING- Blood pressure at home run around 825 systolic.  Outpatient Encounter Prescriptions as of 08/10/2015  Medication Sig  . albuterol (PROVENTIL HFA;VENTOLIN HFA) 108 (90 BASE) MCG/ACT inhaler Inhale 2 puffs into the lungs every 6 (six) hours as needed for wheezing or shortness of breath.  Marland Kitchen atorvastatin (LIPITOR) 40 MG tablet Take 1 tablet (40 mg total) by mouth daily.  . cetirizine (ZYRTEC) 10 MG tablet Take 10 mg by mouth daily.  Marland Kitchen lisinopril-hydrochlorothiazide (PRINZIDE,ZESTORETIC) 20-12.5 MG tablet Take 1 tablet by mouth daily.  Marland Kitchen omeprazole (PRILOSEC) 20 MG capsule Take 1 capsule (20 mg total) by mouth daily.               Hyperlipidemia  This is a chronic problem. The problem is resistant. Recent lipid tests were reviewed and are variable. Factors aggravating her hyperlipidemia include thiazides. Pertinent negatives include no chest pain or shortness of breath. Current antihyperlipidemic treatment includes statins (Has been out of statin and has not taking in several minths- did not know that she was suppose to stay on it.). The current treatment provides moderate improvement of lipids. Compliance problems include adherence to diet and adherence to exercise.  Risk factors for coronary artery disease include dyslipidemia, hypertension and post-menopausal.  Hypertension  This is a chronic problem. The current episode started more than 1 year ago. The problem is unchanged. The problem is controlled. Pertinent negatives include no chest pain, headaches or shortness of breath. Risk factors for coronary artery disease include dyslipidemia, post-menopausal state and sedentary lifestyle. Past  treatments include ACE inhibitors and diuretics. The current treatment provides moderate improvement. Compliance problems include diet and exercise.   GERD Omeprazole OTC keeps symptoms under control Allergy induced asthma Patient has not need albuterol lately- doing well- no wheezing or SOB Is taking OTC Zyrtec and has been using clear eyes eye drops for itching and burning in eyes.   Review of Systems  Constitutional: Negative.   HENT: Negative.   Eyes: Positive for redness and itching.       Having trouble with itching and burning in both eyes.   Respiratory: Negative for shortness of breath.   Cardiovascular: Negative for chest pain.  Endocrine: Negative.   Genitourinary: Negative.   Allergic/Immunologic: Positive for environmental allergies.  Neurological: Negative.  Negative for headaches.  Psychiatric/Behavioral: Negative.   All other systems reviewed and are negative.      Objective:   Physical Exam  Constitutional: She is oriented to person, place, and time. Vital signs are normal. She appears well-developed and well-nourished.  HENT:  Right Ear: Hearing, tympanic membrane, external ear and ear canal normal.  Left Ear: Hearing, tympanic membrane, external ear and ear canal normal.  Nose: Nose normal.  Mouth/Throat: Uvula is midline, oropharynx is clear and moist and mucous membranes are normal.  Eyes: EOM are normal. Pupils are equal, round, and reactive to light.  Redness in both eyes  Neck: Trachea normal, normal range of motion and full passive range of motion without pain. Neck supple. No JVD present. Carotid bruit is not present. No thyromegaly present.  Cardiovascular: Normal rate, regular rhythm, normal heart sounds, intact distal pulses and normal  pulses.  Exam reveals no gallop and no friction rub.   No murmur heard. Pulmonary/Chest: Effort normal and breath sounds normal.  Abdominal: Soft. Normal appearance and bowel sounds are normal. She exhibits no  distension and no mass. There is no tenderness.  Musculoskeletal: Normal range of motion.  Lymphadenopathy:    She has no cervical adenopathy.  Neurological: She is alert and oriented to person, place, and time. She has normal reflexes.  Skin: Skin is warm, dry and intact.  Psychiatric: She has a normal mood and affect. Her speech is normal and behavior is normal. Judgment and thought content normal. Cognition and memory are normal.  Little English. Sometimes hard to understand   BP (!) 148/88 (BP Location: Left Arm, Cuff Size: Normal)   Pulse 88   Temp 97.1 F (36.2 C) (Oral)   Ht 5' 5" (1.651 m)   Wt 155 lb (70.3 kg)   BMI 25.79 kg/m         Assessment & Plan:  1. Essential hypertension *low sodium diet - lisinopril-hydrochlorothiazide (PRINZIDE,ZESTORETIC) 20-12.5 MG tablet; Take 1 tablet by mouth daily.  Dispense: 30 tablet; Refill: 5 - CMP14+EGFR  2. Gastroesophageal reflux disease without esophagitis Avoid spicy foods Do not eat 2 hours prior to bedtime - omeprazole (PRILOSEC) 20 MG capsule; Take 1 capsule (20 mg total) by mouth daily.  Dispense: 30 capsule; Refill: 5  3. Hyperlipidemia with target LDL less than 100 Low fat diet - atorvastatin (LIPITOR) 40 MG tablet; Take 1 tablet (40 mg total) by mouth daily.  Dispense: 30 tablet; Refill: 5 - Lipid panel  4. BMI 25.0-25.9,adult Discussed diet and exercise for person with BMI >25 Will recheck weight in 3-6 months    Labs pending Health maintenance reviewed Diet and exercise encouraged Continue all meds Follow up  In 6 months   [place], FNP

## 2016-02-20 NOTE — Patient Instructions (Signed)
Tasha [name] (Health Maintenance, Female) Un estilo de vida saludable y los cuidados preventivos pueden favorecer considerablemente a la salud y Musician. Pregunte a su mdico cul es el cronograma de exmenes peridicos apropiado para usted. Esta es una buena oportunidad para consultarlo sobre cmo prevenir enfermedades y Tasha [name] sano. Adems de los controles, hay muchas otras cosas que puede hacer usted mismo. Los expertos han realizado numerosas investigaciones Tasha [name] cambios en el estilo de vida y las medidas de prevencin que, Tasha [name], lo ayudarn a mantenerse sano. Solicite a su mdico ms informacin. EL PESO Y LA DIETA  Consuma una dieta saludable.  Asegrese de Tasha [name] verduras, frutas, productos lcteos de bajo contenido de Tasha [name] y Tasha [name].  No consuma muchos alimentos de alto contenido de grasas slidas, azcares agregados o sal.  Realice actividad fsica con regularidad. Esta es una de las prcticas ms importantes que puede hacer por su salud.  La mayora de los adultos deben hacer ejercicio durante al menos 173mnutos por semana. El ejercicio debe aumentar la frecuencia cardaca y pActorla transpiracin (ejercicio de iEnola.  La mayora de los adultos tambin deben hField seismologistejercicios de elongacin al mToysRusveces a la semana. Agregue esto al su plan de ejercicio de intensidad moderada. Mantenga un peso saludable.  El ndice de masa corporal ([name] es una medida que puede utilizarse para identificar posibles problemas de pBelle Valley Proporciona una estimacin de la grasa corporal basndose en el peso y la altura. Su mdico puede ayudarle a dRadiation protection practitionerIAvenely a lScientist, forensico mTheatre managerun peso saludable.  Para las mujeres de 20aos o ms:  Un IMadera Ambulatory Endoscopy Centermenor de 18,5 se considera bajo peso.  Un IAbbeville Area Tasha Centerentre 18,5 y 24,9 es normal.  Un IMethodist Hospitalentre 25 y 29,9 se considera sobrepeso.  Un IMC de 30 o ms se considera  obesidad. Observe los niveles de colesterol y lpidos en la sangre.  Debe comenzar a rEnglish as a second language teacherde lpidos y cResearch officer, trade unionen la sangre a los 20aos y luego repetirlos cada 513aos  Es posible que nAutomotive engineerlos niveles de colesterol con mayor frecuencia si:  Sus niveles de lpidos y colesterol son altos.  Es mayor de 50aos.  Presenta un alto riesgo de padecer enfermedades cardacas. DETECCIN DE CNCER  Cncer de pulmn  Se recomienda realizar exmenes de deteccin de cncer de pulmn a personas adultas entre 557y 833aos que estn en riesgo de dHorticulturist, commercialde pulmn por sus antecedentes de consumo de tabaco.  Se recomienda una tomografa computarizada de baja dosis de los pLiberty Mediaaos a las personas que:  Fuman actualmente.  Hayan dejado el hbito en algn momento en los ltimos 15aos.  Hayan fumado durante 30aos un paquete diario. Un paquete-ao equivale a fumar un promedio de un paquete de cigarrillos diario durante un ao.  Los exmenes de deteccin anuales deben continuar hasta que hayan pasado 15aos desde que dej de fumar.  Ya no debern realizarse si tiene un problema de salud que le impida recibir tratamiento para eScience writerde pulmn. Cncer de mama  Practique la autoconciencia de la mama. Esto significa reconocer la apariencia normal de sus mamas y cmo las siente.  Tambin significa realizar autoexmenes regulares de lJohnson & Tasha Informe a su mdico sobre cualquier cambio, sin importar cun pequeo sea.  Si tiene entre 20 y 31aos, un mdico debe realizarle un examen clnico de las mBrunswick Corporationparte del examen regular de sHohenwald [name]  1 a 3aos.  Si tiene 40aos o ms, debe realizarse un examen clnico de las mamas todos los aos. Tambin considere realizarse una radiografa de las mamas (mamografa) todos los aos.  Si tiene antecedentes familiares de cncer de mama, hable con su mdico para someterse a un estudio  gentico.  Si tiene alto riesgo de padecer cncer de mama, hable con su mdico para someterse a una resonancia magntica y una mamografa todos los aos.  La evaluacin del gen del cncer de mama (BRCA) se recomienda a mujeres que tengan familiares con cnceres relacionados con el BRCA. Los cnceres relacionados con el BRCA incluyen los siguientes:  Mama.  Ovario.  Trompas.  Cnceres de peritoneo.  Los resultados de la evaluacin determinarn la necesidad de asesoramiento gentico y de anlisis de BRCA1 y BRCA2. Cncer de cuello del tero El mdico puede recomendarle que se haga pruebas peridicas de deteccin de cncer de los rganos de la pelvis (ovarios, tero y vagina). Estas pruebas incluyen un examen plvico, que abarca controlar si se produjeron cambios microscpicos en la superficie del cuello del tero (prueba de Papanicolaou). Pueden recomendarle que se haga estas pruebas cada 3aos, a partir de los 21aos.  A las mujeres que tienen entre 30 y 65aos, los mdicos pueden recomendarles que se sometan a exmenes plvicos y pruebas de Papanicolaou cada 3aos, o a la prueba de Papanicolaou y el examen plvico en combinacin con estudios de deteccin del virus del papiloma humano (VPH) cada 5aos. Algunos tipos de VPH aumentan el riesgo de padecer cncer de cuello del tero. La prueba para la deteccin del VPH tambin puede realizarse a mujeres de cualquier edad cuyos resultados de la prueba de Papanicolaou no sean claros.  Es posible que otros mdicos no recomienden exmenes de deteccin a mujeres no embarazadas que se consideran sujetos de bajo riesgo de padecer cncer de pelvis y que no tienen sntomas. Pregntele al mdico si un examen plvico de deteccin es adecuado para usted.  Si ha recibido un tratamiento para el cncer cervical o una enfermedad que podra causar cncer, necesitar realizarse una prueba de Papanicolaou y controles durante al menos 20 aos de concluido el  tratamiento. Si no se ha hecho el Papanicolaou con regularidad, debern volver a evaluarse los factores de riesgo (como tener un nuevo compaero sexual), para determinar si debe realizarse los estudios nuevamente. Algunas mujeres sufren problemas mdicos que aumentan la probabilidad de contraer cncer de cuello del tero. En estos casos, el mdico podr indicar que se realicen controles y pruebas de Papanicolaou con ms frecuencia. Cncer colorrectal  Este tipo de cncer puede detectarse y a menudo prevenirse.  Por lo general, los estudios de rutina se deben comenzar a hacer a partir de los 50 aos y hasta los 75 aos.  Sin embargo, el mdico podr aconsejarle que lo haga antes, si tiene factores de riesgo para el cncer de colon.  Tambin puede recomendarle que use un kit de prueba para hallar sangre oculta en la materia fecal.  Es posible que se use una pequea cmara en el extremo de un tubo para examinar directamente el colon (sigmoidoscopia o colonoscopia) a fin de detectar formas tempranas de cncer colorrectal.  Los exmenes de rutina generalmente comienzan a los 50aos.  El examen directo del colon se debe repetir cada 5 a 10aos hasta los 75aos. Sin embargo, es posible que se realicen exmenes con mayor frecuencia, si se detectan formas tempranas de plipos precancerosos o pequeos bultos. Cncer de piel  Revise   la piel de la cabeza a los pies con regularidad.  Informe a su mdico si aparecen nuevos lunares o los que tiene se modifican, especialmente en su forma y color.  Tambin notifique al mdico si tiene un lunar que es ms grande que el tamao de una goma de lpiz.  Siempre use pantalla solar. Aplique pantalla solar de Kerry Dory y repetida a lo largo del Training and development officer.  Protjase usando mangas y [employer], un sombrero de ala ancha y gafas para el sol, siempre que se encuentre en el exterior. ENFERMEDADES CARDACAS, DIABETES E HIPERTENSIN ARTERIAL   La hipertensin  arterial causa enfermedades cardacas y [place] el riesgo de ictus. La hipertensin arterial es ms probable en los siguientes casos:  Las personas que tienen la presin arterial en el extremo del rango normal (100-139/85-89 mm Hg).  Las personas con sobrepeso u obesidad.  Las Retail banker.  Si usted tiene entre 18 y 39 aos, debe medirse la presin arterial cada 3 a 5 aos. Si usted tiene 40 aos o ms, debe medirse la presin arterial [employer]. Debe medirse la presin arterial dos veces: una vez cuando est en un hospital o una clnica y la otra vez cuando est en otro sitio. Registre el promedio de [place] [name]. Para controlar su presin arterial cuando no est en un hospital o [place], puede usar lo siguiente:  Ardelia Mems mquina automtica para medir la presin arterial en una farmacia.  Un monitor para medir la presin arterial en el hogar.  Si tiene entre 33 y 105 aos, consulte a su mdico si debe tomar aspirina para prevenir el ictus.  Realcese exmenes de deteccin de la diabetes con regularidad. Esto incluye la toma de [place] de sangre para controlar el nivel de azcar en la sangre durante el [place].  Si tiene un peso normal y un bajo riesgo de padecer diabetes, realcese este anlisis cada tres aos despus de los 45aos.  Si tiene sobrepeso y un alto riesgo de padecer diabetes, considere someterse a este anlisis antes o con mayor frecuencia. PREVENCIN DE INFECCIONES  HepatitisB  Si tiene un riesgo ms alto de Museum/gallery curator hepatitis B, debe someterse a un examen de deteccin de este virus. Se considera que tiene un alto riesgo de Museum/gallery curator hepatitis B si:  Naci en un pas donde la hepatitis B es frecuente. Pregntele a su mdico qu pases son considerados de Public affairs consultant.  Sus padres nacieron en un pas de alto riesgo y usted no recibi una vacuna que lo proteja contra la hepatitis B (vacuna contra la hepatitis B).  [place]  [place] agujas  para inyectarse drogas.  Vive con alguien que tiene hepatitis B.  Ha tenido sexo con alguien que tiene hepatitis B.  Recibe tratamiento de hemodilisis.  Toma ciertos medicamentos para el cncer, trasplante de rganos y afecciones autoinmunitarias. Hepatitis C  Se recomienda un anlisis de [place] para:  Todos los que nacieron entre 1945 y [id number].  Todas las personas que tengan un riesgo de haber contrado hepatitis C. Enfermedades de transmisin sexual (ETS).  Debe realizarse pruebas de deteccin de enfermedades de transmisin sexual (ETS), incluidas gonorrea y clamidia si:  Es sexualmente activo y es menor de 24aos.  Es mayor de 24aos, y Investment banker, operational informa que corre riesgo de tener este tipo de infecciones.  La actividad sexual ha cambiado desde que le hicieron la ltima prueba de deteccin y tiene un riesgo mayor de Best boy clamidia o Radio broadcast assistant. Pregntele  al mdico si usted tiene riesgo.  Si no tiene el VIH, pero corre riesgo de infectarse por el virus, se recomienda tomar diariamente un medicamento recetado para evitar la infeccin. Esto se conoce como profilaxis previa a la exposicin. Se considera que est en riesgo si:  Es activo sexualmente y no usa preservativos habitualmente o no conoce el estado del VIH de sus parejas sexuales.  Se inyecta drogas.  Es activo sexualmente con una pareja que tiene VIH. Consulte a su mdico para saber si tiene un alto riesgo de infectarse por el VIH. Si opta por comenzar la profilaxis previa a la exposicin, primero debe realizarse anlisis de deteccin del VIH. Luego, le harn anlisis cada 3meses mientras est tomando los medicamentos para la profilaxis previa a la exposicin.  EMBARAZO   Si es premenopusica y puede quedar embarazada, solicite a su mdico asesoramiento previo a la concepcin.  Si puede quedar embarazada, tome 400 a 800microgramos (mcg) de cido flico todos los das.  Si desea evitar el embarazo, hable con su  mdico sobre el control de la natalidad (anticoncepcin). OSTEOPOROSIS Y MENOPAUSIA   La osteoporosis es una enfermedad en la que los huesos pierden los minerales y la fuerza por el avance de la edad. El resultado pueden ser fracturas graves en los huesos. El riesgo de osteoporosis puede identificarse con una prueba de densidad sea.  Si tiene 65aos o ms, o si est en riesgo de sufrir osteoporosis y fracturas, pregunte a su mdico si debe someterse a exmenes.  Consulte a su mdico si debe tomar un suplemento de calcio o de vitamina D para reducir el riesgo de osteoporosis.  La menopausia puede presentar ciertos sntomas fsicos y riesgos.  La terapia de reemplazo hormonal puede reducir algunos de estos sntomas y riesgos. Consulte a su mdico para saber si la terapia de reemplazo hormonal es conveniente para usted.  INSTRUCCIONES PARA EL CUIDADO EN EL HOGAR   Realcese los estudios de rutina de la salud, dentales y de la vista.  Mantngase al da con las vacunas.  No consuma ningn producto que contenga tabaco, lo que incluye cigarrillos, tabaco de mascar o cigarrillos electrnicos.  Si est embarazada, no beba alcohol.  Si est amamantando, reduzca el consumo de alcohol y la frecuencia con la que consume.  Si es mujer y no est embarazada limite el consumo de alcohol a no ms de 1 medida por da. Una medida equivale a 12onzas de cerveza, 5onzas de vino o 1onzas de bebidas alcohlicas de alta graduacin.  No consuma drogas.  No comparta agujas.  Solicite ayuda a su mdico si necesita apoyo o informacin para abandonar las drogas.  Informe a su mdico si a menudo se siente deprimido.  Notifique a su mdico si alguna vez ha sido vctima de abuso o si no se siente seguro en su hogar.   Esta informacin no tiene como fin reemplazar el consejo del mdico. Asegrese de hacerle al mdico cualquier pregunta que tenga.   Document Released: 03/28/2011 Document Revised:  04/29/2014 Elsevier Interactive Patient Education 2016 Elsevier Inc.  

## 2016-02-21 LAB — LIPID PANEL
CHOLESTEROL TOTAL: 123 mg/dL (ref 100–199)
Chol/HDL Ratio: 3.8 ratio units (ref 0.0–4.4)
HDL: 32 mg/dL — ABNORMAL LOW (ref >39)
LDL CALC: 54 mg/dL (ref 0–99)
TRIGLYCERIDES: 184 mg/dL — AB (ref 0–149)
VLDL CHOLESTEROL CAL: 37 mg/dL (ref 5–40)

## 2016-02-21 LAB — CMP14+EGFR
ALK PHOS: 74 IU/L (ref 39–117)
ALT: 16 IU/L (ref 0–32)
AST: 18 IU/L (ref 0–40)
Albumin/Globulin Ratio: 1.4 (ref 1.2–2.2)
Albumin: 4 g/dL (ref 3.6–4.8)
BILIRUBIN TOTAL: 0.4 mg/dL (ref 0.0–1.2)
BUN/Creatinine Ratio: 18 (ref 12–28)
BUN: 13 mg/dL (ref 8–27)
CHLORIDE: 104 mmol/L (ref 96–106)
CO2: 28 mmol/L (ref 18–29)
CREATININE: 0.72 mg/dL (ref 0.57–1.00)
Calcium: 9.1 mg/dL (ref 8.7–10.3)
GFR calc Af Amer: 102 mL/min/{1.73_m2} (ref >59)
GFR calc non Af Amer: 88 mL/min/{1.73_m2} (ref >59)
GLUCOSE: 99 mg/dL (ref 65–99)
Globulin, Total: 2.8 g/dL (ref 1.5–4.5)
Potassium: 4.1 mmol/L (ref 3.5–5.2)
Sodium: 146 mmol/L — ABNORMAL HIGH (ref 134–144)
Total Protein: 6.8 g/dL (ref 6.0–8.5)

## 2016-02-21 LAB — HEPATITIS C ANTIBODY: Hep C Virus Ab: <0.1 s/co ratio (ref 0.0–0.9)

## 2016-02-27 ENCOUNTER — Ambulatory Visit (INDEPENDENT_AMBULATORY_CARE_PROVIDER_SITE_OTHER): Payer: Medicare Other | Admitting: Family Medicine

## 2016-02-27 ENCOUNTER — Encounter: Payer: Self-pay | Admitting: Family Medicine

## 2016-02-27 VITALS — BP 185/93 | HR 91 | Temp 97.6°F | Ht 65.0 in | Wt 152.0 lb

## 2016-02-27 DIAGNOSIS — I10 Essential (primary) hypertension: Secondary | ICD-10-CM | POA: Diagnosis not present

## 2016-02-27 DIAGNOSIS — F332 Major depressive disorder, recurrent severe without psychotic features: Secondary | ICD-10-CM

## 2016-02-27 DIAGNOSIS — J209 Acute bronchitis, unspecified: Secondary | ICD-10-CM | POA: Diagnosis not present

## 2016-02-27 MED ORDER — BUPROPION HCL ER (SR) 150 MG PO TB12: 150.0000 mg | ORAL_TABLET | Freq: Two times a day (BID) | ORAL | 1 refills | Status: DC

## 2016-02-27 MED ORDER — AZITHROMYCIN 250 MG PO TABS: ORAL_TABLET | ORAL | 0 refills | Status: DC

## 2016-02-27 NOTE — Progress Notes (Signed)
HPI  Patient presents today here with sore throat, patient found to be hypertensive and severely depressed.  Patient Spanish-speaking only, her daughter-in-law speaks some [place] which is present. Encounter performed with the help of video translator named Minna Merritts [phone], as well as Brayton Layman [phone]  Sore throat Cough, headache, sore throat and fatigue for about 3 days. Patient also endorses mild dyspnea. She has right-sided chest pain with cough. No exertional symptoms for the chest pain. Has some malaise, tolerating foods and fluids normally.  Hypertension Usually measures 120s and [id number] at home systolic Good medication compliance Today's the first day of elevated blood pressures.  Depression Ongoing for 2-3 years, patient states that symptoms come in bouts. It started after she lost her job a few years ago. She states life "doesn't seem to have a purpose" after losing her job. She is painful for her grandchildren and family whom she feels does love her. She states they are the reason she has not committed suicide. She has daily symptoms of feeling like she would be better off dead. She has considered committing suicide but does not have a plan. She contracts for safety today and agrees to reach out for help to her daughter-in-law who is present today or call 911 if she has a plan to hurt herself. She is willing to try medications She is also willing to try a counselor, they may use their pastor at church as a Social worker.  PMH: Smoking status noted ROS: Per HPI  Objective: BP (!) 185/93   Pulse 91   Temp 97.6 F (36.4 C) (Oral)   Ht 5\' 5"  (1.651 m)   Wt 152 lb (68.9 kg)   BMI 25.29 kg/m  Gen: NAD, alert, cooperative with exam HEENT: NCAT, oropharynx moist CV: RRR, good S1/S2, no murmur Chest wall: Tenderness to palpation of left costosternal border Resp: CTABL, no wheezes, non-labored Ext: No edema, warm Neuro: Alert and oriented, No gross deficits  Psych: Tearful  throughout exam Denies suicidal thoughts currently but states that she does have frequent thoughts. Contracts for safety. Depressed affect and mood  Assessment and plan:  # Cough, acute bronchitis Considering chest pain and slight shortness of breath I treated her with azithromycin for likely infectious etiology. I think this is most likely the source of her elevated blood pressure today. Supportive care, low threshold for return  Note that chest pain is also reproducible at the left costosternal border   # Hypertension Likely exacerbated due to acute illness, also mood could be affecting Continue current medications No signs of endorgan damage  # Depression Severe depression, consistent with major depressive disorder Contracts for safety, has frequent suicidal thoughts Starting Wellbutrin today, 1 pill daily 3 days titrate to one pill twice daily after 3 days. Patient will consider counseling All of her and her daughter-in-law's questions were discussed and answered using the video interpreter  Follow-up 3 weeks for depression and hypertension.  Meds ordered this encounter  Medications  . azithromycin (ZITHROMAX) 250 MG tablet    Sig: Take 2 tablets on day 1 and 1 tablet daily after that    Dispense:  6 tablet    Refill:  0  . buPROPion (WELLBUTRIN SR) 150 MG 12 hr tablet    Sig: Take 1 tablet (150 mg total) by mouth 2 (two) times daily.    Dispense:  60 tablet    Refill:  [place], MD [facility] 02/27/2016, 12:04 PM

## 2016-02-27 NOTE — Patient Instructions (Signed)
Great to see you!  I have sent an antibiotic, azithromycin, for your cough. Please take for 5 days.   I have also started you ona medicine for depression, start with 1 pill once a day for 3 days, then increase to 1 pill twice daily.   Come back in 3-4 weeks to discuss depression

## 2016-03-05 ENCOUNTER — Telehealth: Payer: Self-pay | Admitting: Family Medicine

## 2016-03-05 MED ORDER — ESCITALOPRAM OXALATE 10 MG PO TABS: 10.0000 mg | ORAL_TABLET | Freq: Every day | ORAL | 5 refills | Status: DC

## 2016-03-05 NOTE — Telephone Encounter (Signed)
OK, I will consider this intolerance of the medication.   Ok to stop wellbutrin. Start lexapro, 1 pill once daily. [facility]. Take 1 pill once daily.    Laroy Apple, MD [facility] 03/05/2016, 12:10 PM

## 2016-03-05 NOTE — Telephone Encounter (Signed)
Patient aware of medication change. 

## 2016-03-12 ENCOUNTER — Ambulatory Visit (INDEPENDENT_AMBULATORY_CARE_PROVIDER_SITE_OTHER): Payer: Medicare Other | Admitting: Family Medicine

## 2016-03-12 ENCOUNTER — Encounter: Payer: Self-pay | Admitting: Family Medicine

## 2016-03-12 DIAGNOSIS — F329 Major depressive disorder, single episode, unspecified: Secondary | ICD-10-CM | POA: Diagnosis not present

## 2016-03-12 DIAGNOSIS — F32A Depression, unspecified: Secondary | ICD-10-CM | POA: Insufficient documentation

## 2016-03-12 NOTE — Patient Instructions (Signed)
Great to see you!  Please come back in 1 month to follow up for depression.   It may take 6 weeks or more for the medicine to take full effect.

## 2016-03-12 NOTE — Progress Notes (Signed)
   HPI  Patient presents today in follow-up for depression.  Spanish speaking oinly, exam conducted with help of video interpreter Celina [phone]  Patient tried wellbutrin and a few weeks ago and had tiredness, dizziness, and headaches. She stopped the medication and the symptoms resolved.  She was too nervous about side effects to try Lexapro. Her symptoms are slightly improved since last visit, however she still has anhedonia, depressed feelings, psychomotor slowing, and difficulty concentrating. She would like to change medications and states that she will stop the medication if she has a side effect.  Otherwise she feels well today.  PMH: Smoking status noted ROS: Per HPI  Objective: BP (!) 168/92   Pulse 94   Temp 97.5 F (36.4 C) (Oral)   Ht 5\' 5"  (1.651 m)   Wt 149 lb 12.8 oz (67.9 kg)   BMI 24.93 kg/m  Gen: NAD, alert, cooperative with exam HEENT: NCAT CV: RRR, good S1/S2, no murmur Resp: CTABL, no wheezes, non-labored Ext: No edema, warm Neuro: Alert and oriented, No gross deficits  Psych: Appropriate mood and affect Patient's hair is done today and neatly in place  Depression screen [facility] 2/9 03/12/2016 02/27/2016 02/20/2016 10/02/2015 02/09/2015  Decreased Interest 2 3 0 0 1  Down, Depressed, Hopeless 2 2 0 0 1  PHQ - 2 Score 4 5 0 0 2  Altered sleeping 2 3 - - 0  Tired, decreased energy 2 3 - - 2  Change in appetite 1 0 - - 0  Feeling bad or failure about yourself  0 - - - 0  Trouble concentrating 2 1 - - 0  Moving slowly or fidgety/restless 1 1 - - 0  Suicidal thoughts 0 3 - - 0  PHQ-9 Score 12 16 - - 4  Difficult doing work/chores - Somewhat difficult - - -     Assessment and plan:  # Depression Moderately improved, however not due to medication. Did not tolerate well future and, discontinue Starting Lexapro today, follow-up in 3-4 weeks. All questions discussed and answered using video interpreter today    Laroy Apple, MD [facility] 03/12/2016, 9:41 AM

## 2016-03-15 ENCOUNTER — Other Ambulatory Visit: Payer: Self-pay | Admitting: *Deleted

## 2016-03-15 DIAGNOSIS — E785 Hyperlipidemia, unspecified: Secondary | ICD-10-CM

## 2016-03-15 DIAGNOSIS — K219 Gastro-esophageal reflux disease without esophagitis: Secondary | ICD-10-CM

## 2016-03-15 MED ORDER — OMEPRAZOLE 20 MG PO CPDR: 20.0000 mg | DELAYED_RELEASE_CAPSULE | Freq: Every day | ORAL | 5 refills | Status: DC

## 2016-03-15 MED ORDER — ATORVASTATIN CALCIUM 40 MG PO TABS: 40.0000 mg | ORAL_TABLET | Freq: Every day | ORAL | 5 refills | Status: DC

## 2016-04-12 ENCOUNTER — Encounter: Payer: Self-pay | Admitting: Family Medicine

## 2016-04-12 ENCOUNTER — Ambulatory Visit (INDEPENDENT_AMBULATORY_CARE_PROVIDER_SITE_OTHER): Payer: Medicare Other | Admitting: Family Medicine

## 2016-04-12 VITALS — BP 163/83 | HR 95 | Temp 97.3°F | Ht 65.0 in | Wt 148.2 lb

## 2016-04-12 DIAGNOSIS — F329 Major depressive disorder, single episode, unspecified: Secondary | ICD-10-CM | POA: Diagnosis not present

## 2016-04-12 DIAGNOSIS — K219 Gastro-esophageal reflux disease without esophagitis: Secondary | ICD-10-CM

## 2016-04-12 DIAGNOSIS — I10 Essential (primary) hypertension: Secondary | ICD-10-CM | POA: Diagnosis not present

## 2016-04-12 DIAGNOSIS — F32A Depression, unspecified: Secondary | ICD-10-CM

## 2016-04-12 MED ORDER — LISINOPRIL-HYDROCHLOROTHIAZIDE 20-25 MG PO TABS: 1.0000 | ORAL_TABLET | Freq: Every day | ORAL | 3 refills | Status: DC

## 2016-04-12 MED ORDER — PANTOPRAZOLE SODIUM 20 MG PO TBEC: 20.0000 mg | DELAYED_RELEASE_TABLET | Freq: Every day | ORAL | 2 refills | Status: DC

## 2016-04-12 NOTE — Patient Instructions (Signed)
Great to see you!  I Started protonix for heartburn  I have increased your blood pressure medication, there is a new prescription at your pharmacy

## 2016-04-12 NOTE — Progress Notes (Signed)
   HPI  Patient presents today here to follow-up for depression as well as sore throat.  Patient is a Spanish-speaking only patient, the interview was conducted with the help of video interpreter Peter Congo [phone]  deression Patient denies suicidal thoughts and her mood is very good. She states that she does not have any side effects except for perhaps the heartburn which is described below. She would like to continue current medications   Patient complains of 2-3 weeks of difficulty swallowing solid foods, some burning of the throat when lying down at night, as well as generalized sore throat. She's not sure if this is associated with the medication, she would be glad to try an acid reducer medication.  Hypertension Good medication compliance, states that blood pressure ranges [id number] systolic at home No chest pain  PMH: Smoking status noted ROS: Per HPI  Objective: BP (!) 163/83   Pulse 95   Temp 97.3 F (36.3 C) (Oral)   Ht 5\' 5"  (1.651 m)   Wt 148 lb 3.2 oz (67.2 kg)   BMI 24.66 kg/m  Gen: NAD, alert, cooperative with exam HEENT: NCAT CV: RRR, good S1/S2, no murmur Resp: CTABL, no wheezes, non-labored Ext: No edema, warm Neuro: Alert and oriented, No gross deficits  Depression screen [facility] 2/9 04/12/2016 03/12/2016 02/27/2016 02/20/2016 10/02/2015  Decreased Interest 0 2 3 0 0  Down, Depressed, Hopeless 0 2 2 0 0  PHQ - 2 Score 0 4 5 0 0  Altered sleeping - 2 3 - -  Tired, decreased energy - 2 3 - -  Change in appetite - 1 0 - -  Feeling bad or failure about yourself  - 0 - - -  Trouble concentrating - 2 1 - -  Moving slowly or fidgety/restless - 1 1 - -  Suicidal thoughts - 0 3 - -  PHQ-9 Score - 12 16 - -  Difficult doing work/chores - - Somewhat difficult - -     Assessment and plan:  # Depression Improved quite a bit with Lexapro, continue Lexapro Unlikely the Lexapro has caused the GERD symptoms, although this is a possibility. I believe benefits outweighed  the risk at this time.  # GERD Treat with Protonix 3 months, patient is agreeable Plan to de-escalate to H2 blocker  # Hypertension Uncontrolled, confirmed with manual blood pressure cuff Increase Prinzide to 20/25   Meds ordered this encounter  Medications  . lisinopril-hydrochlorothiazide (PRINZIDE,ZESTORETIC) 20-25 MG tablet    Sig: Take 1 tablet by mouth daily.    Dispense:  90 tablet    Refill:  3    PLease DC previous 20/12.5  . pantoprazole (PROTONIX) 20 MG tablet    Sig: Take 1 tablet (20 mg total) by mouth daily.    Dispense:  30 tablet    Refill:  [place], MD [facility] 04/12/2016, 3:41 PM

## 2016-07-11 ENCOUNTER — Ambulatory Visit (INDEPENDENT_AMBULATORY_CARE_PROVIDER_SITE_OTHER): Payer: Medicare Other | Admitting: Family Medicine

## 2016-07-11 ENCOUNTER — Encounter: Payer: Self-pay | Admitting: Family Medicine

## 2016-07-11 VITALS — BP 164/90 | HR 83 | Temp 97.0°F | Ht 65.0 in | Wt 151.4 lb

## 2016-07-11 DIAGNOSIS — F329 Major depressive disorder, single episode, unspecified: Secondary | ICD-10-CM

## 2016-07-11 DIAGNOSIS — I10 Essential (primary) hypertension: Secondary | ICD-10-CM | POA: Diagnosis not present

## 2016-07-11 DIAGNOSIS — F32A Depression, unspecified: Secondary | ICD-10-CM

## 2016-07-11 DIAGNOSIS — K219 Gastro-esophageal reflux disease without esophagitis: Secondary | ICD-10-CM

## 2016-07-11 DIAGNOSIS — E785 Hyperlipidemia, unspecified: Secondary | ICD-10-CM | POA: Diagnosis not present

## 2016-07-11 MED ORDER — ATORVASTATIN CALCIUM 40 MG PO TABS: 40.0000 mg | ORAL_TABLET | Freq: Every day | ORAL | 5 refills | Status: DC

## 2016-07-11 MED ORDER — PANTOPRAZOLE SODIUM 20 MG PO TBEC: 20.0000 mg | DELAYED_RELEASE_TABLET | Freq: Every day | ORAL | 5 refills | Status: DC

## 2016-07-11 MED ORDER — LISINOPRIL-HYDROCHLOROTHIAZIDE 20-25 MG PO TABS: 1.0000 | ORAL_TABLET | Freq: Every day | ORAL | 3 refills | Status: DC

## 2016-07-11 MED ORDER — ESCITALOPRAM OXALATE 10 MG PO TABS: 10.0000 mg | ORAL_TABLET | Freq: Every day | ORAL | 5 refills | Status: DC

## 2016-07-11 NOTE — Progress Notes (Signed)
   HPI  Patient presents today here for follow-up for hypertension, depression, GERD hyperlipidemia.   Visit conducted with video interpreter Cory Roughen [phone] Hypertension 130s and 140s a home, feels anxious here. Good medication compliance.  Depression Denies suicidal thoughts Anhedonia is improved  PMH: Smoking status noted ROS: Per HPI  Objective: BP (!) 164/90   Pulse 83   Temp 97 F (36.1 C) (Oral)   Ht '5\' 5"'$  (1.651 m)   Wt 151 lb 6.4 oz (68.7 kg)   BMI 25.19 kg/m  Gen: NAD, alert, cooperative with exam HEENT: NCAT, EOMI, PERRL CV: RRR, good S1/S2, no murmur Resp: CTABL, no wheezes, non-labored Abd: SNTND, BS present, no guarding or organomegaly Ext: No edema, warm Neuro: Alert and oriented, No gross deficits  Assessment and plan:  # Hypertension Elevated here, patient has significant anxiety about being in the office. Blood pressure like, follow-up in 3 months No changes  # GERD Stable, continue PPI  # Pression Previously very severe, improved very much on Lexapro Continue  # Hyperlipidemia Goal less than 100, repeat LDL Continue Lipitor     Orders Placed This Encounter  Procedures  . LDL Cholesterol, Direct  . CMP14+EGFR  . CBC with Differential/Platelet    Meds ordered this encounter  Medications  . pantoprazole (PROTONIX) 20 MG tablet    Sig: Take 1 tablet (20 mg total) by mouth daily.    Dispense:  30 tablet    Refill:  5  . lisinopril-hydrochlorothiazide (PRINZIDE,ZESTORETIC) 20-25 MG tablet    Sig: Take 1 tablet by mouth daily.    Dispense:  90 tablet    Refill:  3  . escitalopram (LEXAPRO) 10 MG tablet    Sig: Take 1 tablet (10 mg total) by mouth daily.    Dispense:  30 tablet    Refill:  5  . atorvastatin (LIPITOR) 40 MG tablet    Sig: Take 1 tablet (40 mg total) by mouth daily.    Dispense:  30 tablet    Refill:  [place], MD [facility] 07/11/2016, 3:31 PM

## 2016-07-11 NOTE — Patient Instructions (Signed)
Great to see you!  Please come back in 3 months unless you need [place] sooner.   Please write down your blood pressure 3-4 times a week and bring it back in 3 months.

## 2016-07-12 LAB — CMP14+EGFR
ALT: 18 IU/L (ref 0–32)
AST: 22 IU/L (ref 0–40)
Albumin/Globulin Ratio: 1.7 (ref 1.2–2.2)
Albumin: 4.5 g/dL (ref 3.6–4.8)
Alkaline Phosphatase: 76 IU/L (ref 39–117)
BUN/Creatinine Ratio: 28 (ref 12–28)
BUN: 21 mg/dL (ref 8–27)
Bilirubin Total: 0.3 mg/dL (ref 0.0–1.2)
CALCIUM: 9.3 mg/dL (ref 8.7–10.3)
CO2: 27 mmol/L (ref 18–29)
CREATININE: 0.76 mg/dL (ref 0.57–1.00)
Chloride: 99 mmol/L (ref 96–106)
GFR calc Af Amer: 95 mL/min/{1.73_m2} (ref >59)
GFR, EST NON AFRICAN AMERICAN: 82 mL/min/{1.73_m2} (ref >59)
GLOBULIN, TOTAL: 2.7 g/dL (ref 1.5–4.5)
Glucose: 112 mg/dL — ABNORMAL HIGH (ref 65–99)
Potassium: 3.8 mmol/L (ref 3.5–5.2)
Sodium: 142 mmol/L (ref 134–144)
TOTAL PROTEIN: 7.2 g/dL (ref 6.0–8.5)

## 2016-07-12 LAB — CBC WITH DIFFERENTIAL/PLATELET
BASOS: 0 %
Basophils Absolute: 0 10*3/uL (ref 0.0–0.2)
EOS (ABSOLUTE): 0.1 10*3/uL (ref 0.0–0.4)
EOS: 1 %
HEMOGLOBIN: 12 g/dL (ref 11.1–15.9)
Hematocrit: 37.2 % (ref 34.0–46.6)
IMMATURE GRANULOCYTES: 0 %
Immature Grans (Abs): 0 10*3/uL (ref 0.0–0.1)
Lymphocytes Absolute: 2 10*3/uL (ref 0.7–3.1)
Lymphs: 21 %
MCH: 28.8 pg (ref 26.6–33.0)
MCHC: 32.3 g/dL (ref 31.5–35.7)
MCV: 89 fL (ref 79–97)
MONOCYTES: 10 %
Monocytes Absolute: 0.9 10*3/uL (ref 0.1–0.9)
NEUTROS PCT: 68 %
Neutrophils Absolute: 6.6 10*3/uL (ref 1.4–7.0)
PLATELETS: 101 10*3/uL — AB (ref 150–379)
RBC: 4.17 x10E6/uL (ref 3.77–5.28)
RDW: 14.5 % (ref 12.3–15.4)
WBC: 9.7 10*3/uL (ref 3.4–10.8)

## 2016-07-12 LAB — LDL CHOLESTEROL, DIRECT: LDL Direct: 77 mg/dL (ref 0–99)

## 2016-10-15 ENCOUNTER — Encounter: Payer: Self-pay | Admitting: Family Medicine

## 2016-10-15 ENCOUNTER — Ambulatory Visit (INDEPENDENT_AMBULATORY_CARE_PROVIDER_SITE_OTHER): Payer: Medicare Other | Admitting: Family Medicine

## 2016-10-15 VITALS — BP 134/81 | HR 85 | Temp 98.1°F | Ht 65.0 in | Wt 153.2 lb

## 2016-10-15 DIAGNOSIS — I10 Essential (primary) hypertension: Secondary | ICD-10-CM | POA: Diagnosis not present

## 2016-10-15 DIAGNOSIS — R739 Hyperglycemia, unspecified: Secondary | ICD-10-CM

## 2016-10-15 DIAGNOSIS — F329 Major depressive disorder, single episode, unspecified: Secondary | ICD-10-CM

## 2016-10-15 DIAGNOSIS — E785 Hyperlipidemia, unspecified: Secondary | ICD-10-CM

## 2016-10-15 DIAGNOSIS — K219 Gastro-esophageal reflux disease without esophagitis: Secondary | ICD-10-CM

## 2016-10-15 DIAGNOSIS — F32A Depression, unspecified: Secondary | ICD-10-CM

## 2016-10-15 LAB — BAYER DCA HB A1C WAIVED: HB A1C: 5.3 % (ref <7.0)

## 2016-10-15 MED ORDER — PANTOPRAZOLE SODIUM 20 MG PO TBEC: 20.0000 mg | DELAYED_RELEASE_TABLET | Freq: Every day | ORAL | 3 refills | Status: DC

## 2016-10-15 MED ORDER — ESCITALOPRAM OXALATE 10 MG PO TABS: 10.0000 mg | ORAL_TABLET | Freq: Every day | ORAL | 3 refills | Status: DC

## 2016-10-15 MED ORDER — LISINOPRIL-HYDROCHLOROTHIAZIDE 20-25 MG PO TABS: 1.0000 | ORAL_TABLET | Freq: Every day | ORAL | 3 refills | Status: DC

## 2016-10-15 MED ORDER — ATORVASTATIN CALCIUM 40 MG PO TABS: 40.0000 mg | ORAL_TABLET | Freq: Every day | ORAL | 3 refills | Status: DC

## 2016-10-15 NOTE — Progress Notes (Signed)
   HPI  Patient presents today here for follow-up chronic medical conditions.  Patient is seen with the help of an in person interpreter today.  Hypertension Good medication compliance Blood pressure at home ranges 130s and 140s mostly. No chest pain, dyspnea, palpitations. Patient does have some left chest discomfort after she eats "lots of bread". This does not occur with exertion, is not associated with shortness of breath sweating or palpitations.  GERD Well-controlled with PPI.  Patient is fasting today.  Mood Doing well, no depression. Medication seems to be helping without side effect.  PMH: Smoking status noted ROS: Per HPI  Objective: BP 134/81   Pulse 85   Temp 98.1 F (36.7 C) (Oral)   Ht 5\' 5"  (1.651 m)   Wt 153 lb 3.2 oz (69.5 kg)   BMI 25.49 kg/m  Gen: NAD, alert, cooperative with exam HEENT: NCAT CV: RRR, good S1/S2, no murmur Resp: CTABL, no wheezes, non-labored Ext: No edema, warm Neuro: Alert and oriented, No gross deficits  Assessment and plan:  # Hypertension Well-controlled on Prinzide, continue Labs are up-to-date   # Elevated glucose Previous glucose is elevated, checking A1c.   # GERD Doing well PPI, continue  # Depression Improved very much on Lexapro, continue    Orders Placed This Encounter  Procedures  . Bayer DCA Hb A1c Waived    Meds ordered this encounter  Medications  . atorvastatin (LIPITOR) 40 MG tablet    Sig: Take 1 tablet (40 mg total) by mouth daily.    Dispense:  90 tablet    Refill:  3  . escitalopram (LEXAPRO) 10 MG tablet    Sig: Take 1 tablet (10 mg total) by mouth daily.    Dispense:  90 tablet    Refill:  3  . lisinopril-hydrochlorothiazide (PRINZIDE,ZESTORETIC) 20-25 MG tablet    Sig: Take 1 tablet by mouth daily.    Dispense:  90 tablet    Refill:  3  . pantoprazole (PROTONIX) 20 MG tablet    Sig: Take 1 tablet (20 mg total) by mouth daily.    Dispense:  90 tablet    Refill:  [place], MD [place] 10/15/2016, 10:39 AM

## 2016-11-05 ENCOUNTER — Other Ambulatory Visit: Payer: Medicare Other

## 2016-11-05 ENCOUNTER — Other Ambulatory Visit: Payer: Self-pay | Admitting: *Deleted

## 2016-11-05 DIAGNOSIS — I10 Essential (primary) hypertension: Secondary | ICD-10-CM

## 2016-11-06 LAB — FECAL OCCULT BLOOD, IMMUNOCHEMICAL: FECAL OCCULT BLD: NEGATIVE

## 2017-02-14 ENCOUNTER — Ambulatory Visit (INDEPENDENT_AMBULATORY_CARE_PROVIDER_SITE_OTHER): Payer: Medicare Other | Admitting: Family Medicine

## 2017-02-14 ENCOUNTER — Ambulatory Visit (INDEPENDENT_AMBULATORY_CARE_PROVIDER_SITE_OTHER): Payer: Medicare Other

## 2017-02-14 ENCOUNTER — Ambulatory Visit: Payer: Medicare Other | Admitting: Nurse Practitioner

## 2017-02-14 VITALS — BP 145/78 | HR 90 | Temp 97.4°F | Ht 65.0 in | Wt 151.6 lb

## 2017-02-14 DIAGNOSIS — F329 Major depressive disorder, single episode, unspecified: Secondary | ICD-10-CM | POA: Diagnosis not present

## 2017-02-14 DIAGNOSIS — M25561 Pain in right knee: Secondary | ICD-10-CM

## 2017-02-14 DIAGNOSIS — M25512 Pain in left shoulder: Secondary | ICD-10-CM

## 2017-02-14 DIAGNOSIS — M25562 Pain in left knee: Secondary | ICD-10-CM

## 2017-02-14 DIAGNOSIS — F32A Depression, unspecified: Secondary | ICD-10-CM

## 2017-02-14 MED ORDER — METHYLPREDNISOLONE ACETATE 80 MG/ML IJ SUSP
80.0000 mg | Freq: Once | INTRAMUSCULAR | Status: AC
  Administered 2017-02-14: 80 mg via INTRAMUSCULAR

## 2017-02-14 NOTE — Progress Notes (Addendum)
HPI  Interview conducted with the help of video interpreter [phone]  Patient presents today here for follow-up of chronic medical conditions as well as pain from a fall.  Patient states that about 10-12 days ago she had a fall down a few steps landing on her knees and being unable to get up for about 30 minutes until her husband came to help her. Since that time she has had left shoulder pain and bilateral leg pain. Left shoulder pain is described as left deltoid area shoulder pain radiating down to the entire hand. No neck pain. Bilateral lower extremity pain described as burning type pain from the toes up to the knees with bilateral knee pain. Patient has been walking and bearing weight without a problem. Any medication for pain.  Depression Good medication compliance, tolerating this well.  Denies any depression or depressed feelings currently.  PMH: Smoking status noted ROS: Per HPI  Objective: BP (!) 145/78   Pulse 90   Temp (!) 97.4 F (36.3 C) (Oral)   Ht 5\' 5"  (1.651 m)   Wt 151 lb 9.6 oz (68.8 kg)   BMI 25.23 kg/m  Gen: NAD, alert, cooperative with exam HEENT: NCAT CV: RRR, good S1/S2, no murmur Resp: CTABL, no wheezes, non-labored Ext: No edema, warm Neuro: Alert and oriented, No gross deficits  MSK Patient with full range of motion of neck with no pain, no tenderness to palpation of cervical spine, lumbar spine, or cervical paraspinal muscles.  She does have mild tenderness to palpation of paraspinal muscles in the lumbar area on the left. Full range of motion of bilateral shoulders, no bony tenderness to palpation of left shoulder landmarks.  Positive empty can test but negative Neer and Hawkins sign on the left  BL knee without erythema, effusion, bruising, or gross deformity No joint line tenderness.  ligamentously intact to Lachman's and with varus and valgus stress.  Negative McMurray's test   Depression screen [facility] 2/9 02/14/2017 10/15/2016 07/11/2016  04/12/2016 03/12/2016  Decreased Interest 0 0 0 0 2  Down, Depressed, Hopeless 0 0 0 0 2  PHQ - 2 Score 0 0 0 0 4  Altered sleeping - - - - 2  Tired, decreased energy - - - - 2  Change in appetite - - - - 1  Feeling bad or failure about yourself  - - - - 0  Trouble concentrating - - - - 2  Moving slowly or fidgety/restless - - - - 1  Suicidal thoughts - - - - 0  PHQ-9 Score - - - - 12  Difficult doing work/chores - - - - -     Assessment and plan:  #Depression Doing well Continue Lexapro, no changes  #Left shoulder pain Patient with left shoulder pain described as discrete deltoid area pain with radiculopathy distribution to the left hand. No signs of cervical spine problems on exam. IM Depo-Medrol given for pain and   #Bilateral knee pain After a fall Exam reassuring Plain films today at her request. Discussed Tylenol use Low Threshold for follow-up   Orders Placed This Encounter  Procedures  . DG Shoulder Left    Standing Status:   Future    Number of Occurrences:   1    Standing Expiration Date:   04/16/2018    Order Specific Question:   Reason for Exam (SYMPTOM  OR DIAGNOSIS REQUIRED)    Answer:   L shoulder pain after a fall    Order Specific Question:  Preferred imaging location?    Answer:   Internal    Order Specific Question:   Radiology Contrast Protocol - do NOT remove file path    Answer:   \\[email or web address]\Radiant\DXFluoroContrastProtocols.pdf  . DG Knee 1-2 Views Left    Standing Status:   Future    Number of Occurrences:   1    Standing Expiration Date:   04/16/2018    Order Specific Question:   Reason for Exam (SYMPTOM  OR DIAGNOSIS REQUIRED)    Answer:   BL knee pain after a fall    Order Specific Question:   Preferred imaging location?    Answer:   Internal    Order Specific Question:   Radiology Contrast Protocol - do NOT remove file path    Answer:   \\[email or web address]\Radiant\DXFluoroContrastProtocols.pdf  . DG Knee 1-2 Views Right      Standing Status:   Future    Number of Occurrences:   1    Standing Expiration Date:   04/16/2018    Order Specific Question:   Reason for Exam (SYMPTOM  OR DIAGNOSIS REQUIRED)    Answer:   BL knee pain after a fall    Order Specific Question:   Preferred imaging location?    Answer:   Internal    Order Specific Question:   Radiology Contrast Protocol - do NOT remove file path    Answer:   \\[email or web address]\[email or web address]\DXFluoroContrastProtocols.[email or web address]    Meds ordered this encounter  Medications  . methylPREDNISolone acetate (DEPO-MEDROL) injection 80 mg    Laroy Apple, MD Tristan Schroeder [facility] 02/14/2017, 11:54 AM

## 2017-02-14 NOTE — Patient Instructions (Signed)
Great to see yoU!  You were given a cortisone shot for pain today ( Depo Medrol). This should help you feel better quickly.   Come back in 4 months, we will plan to do blood work at that visit.

## 2017-03-11 ENCOUNTER — Ambulatory Visit (INDEPENDENT_AMBULATORY_CARE_PROVIDER_SITE_OTHER): Payer: Medicare Other | Admitting: *Deleted

## 2017-03-11 DIAGNOSIS — Z23 Encounter for immunization: Secondary | ICD-10-CM

## 2017-03-11 DIAGNOSIS — Z1231 Encounter for screening mammogram for malignant neoplasm of breast: Secondary | ICD-10-CM | POA: Diagnosis not present

## 2017-06-17 ENCOUNTER — Ambulatory Visit (INDEPENDENT_AMBULATORY_CARE_PROVIDER_SITE_OTHER): Payer: Medicare Other | Admitting: Family Medicine

## 2017-06-17 ENCOUNTER — Encounter: Payer: Self-pay | Admitting: Family Medicine

## 2017-06-17 VITALS — BP 137/77 | HR 86 | Temp 97.5°F | Ht 65.0 in | Wt 150.4 lb

## 2017-06-17 DIAGNOSIS — Z0000 Encounter for general adult medical examination without abnormal findings: Secondary | ICD-10-CM

## 2017-06-17 DIAGNOSIS — Z23 Encounter for immunization: Secondary | ICD-10-CM

## 2017-06-17 DIAGNOSIS — I10 Essential (primary) hypertension: Secondary | ICD-10-CM | POA: Diagnosis not present

## 2017-06-17 DIAGNOSIS — F329 Major depressive disorder, single episode, unspecified: Secondary | ICD-10-CM

## 2017-06-17 DIAGNOSIS — F32A Depression, unspecified: Secondary | ICD-10-CM

## 2017-06-17 DIAGNOSIS — E785 Hyperlipidemia, unspecified: Secondary | ICD-10-CM | POA: Diagnosis not present

## 2017-06-17 NOTE — Progress Notes (Signed)
   HPI  Patient presents today here for follow chronic medical problems  Spanish speaker, translator via video translator Mariann Laster [phone]  Hypertension Good medication compliance, does not need refill.  Hyperlipidemia Good medication compliance and tolerance.  Depression Doing very well with Lexapro, denies any feelings of depression currently.   PMH: Smoking status noted ROS: Per HPI  Objective: BP 137/77   Pulse 86   Temp (!) 97.5 F (36.4 C) (Oral)   Ht _0  (1.651 m)   Wt 150 lb 6.4 oz (68.2 kg)   BMI 25.03 kg/m  Gen: NAD, alert, cooperative with exam HEENT: NCAT CV: RRR, good S1/S2, no murmur Resp: CTABL, no wheezes, non-labored Ext: No edema, warm Neuro: Alert and oriented, No gross deficits  Assessment and plan:  #Depression Doing very well with Lexapro Continue with no changes  #Hypertension Well-controlled on Prinzide Labs today   #Hyperlipidemia Previously recently well controlled on Lipitor, repeat labs today, nonfasting   HCM-Pneumovax given today, counseling provided for all vaccines and vaccine components.  Orders Placed This Encounter  Procedures  . CMP14+EGFR  . CBC with Differential/Platelet  . Lipid panel  . TSH     Laroy Apple, MD [facility] 06/17/2017, 9:59 AM

## 2017-06-17 NOTE — Addendum Note (Signed)
Addended by: Nigel Berthold C on: 06/17/2017 10:15 AM   Modules accepted: Orders

## 2017-06-17 NOTE — Patient Instructions (Signed)
Great to see you!  Come back to see Dr. Dettinger in 6 months 

## 2017-06-18 ENCOUNTER — Encounter: Payer: Self-pay | Admitting: *Deleted

## 2017-06-18 ENCOUNTER — Ambulatory Visit (INDEPENDENT_AMBULATORY_CARE_PROVIDER_SITE_OTHER): Payer: Medicare Other | Admitting: *Deleted

## 2017-06-18 VITALS — BP 125/70 | HR 85 | Ht 65.0 in | Wt 150.0 lb

## 2017-06-18 DIAGNOSIS — Z0000 Encounter for general adult medical examination without abnormal findings: Secondary | ICD-10-CM | POA: Diagnosis not present

## 2017-06-18 LAB — LIPID PANEL
CHOLESTEROL TOTAL: 111 mg/dL (ref 100–199)
Chol/HDL Ratio: 3.1 ratio (ref 0.0–4.4)
HDL: 36 mg/dL — ABNORMAL LOW (ref >39)
LDL Calculated: 50 mg/dL (ref 0–99)
TRIGLYCERIDES: 125 mg/dL (ref 0–149)
VLDL Cholesterol Cal: 25 mg/dL (ref 5–40)

## 2017-06-18 LAB — CMP14+EGFR
ALBUMIN: 4.2 g/dL (ref 3.6–4.8)
ALK PHOS: 78 IU/L (ref 39–117)
ALT: 19 IU/L (ref 0–32)
AST: 20 IU/L (ref 0–40)
Albumin/Globulin Ratio: 1.6 (ref 1.2–2.2)
BILIRUBIN TOTAL: 0.3 mg/dL (ref 0.0–1.2)
BUN / CREAT RATIO: 22 (ref 12–28)
BUN: 17 mg/dL (ref 8–27)
CO2: 26 mmol/L (ref 20–29)
Calcium: 9.4 mg/dL (ref 8.7–10.3)
Chloride: 101 mmol/L (ref 96–106)
Creatinine, Ser: 0.78 mg/dL (ref 0.57–1.00)
GFR calc Af Amer: 92 mL/min/{1.73_m2} (ref >59)
GFR calc non Af Amer: 79 mL/min/{1.73_m2} (ref >59)
GLUCOSE: 79 mg/dL (ref 65–99)
Globulin, Total: 2.6 g/dL (ref 1.5–4.5)
Potassium: 4 mmol/L (ref 3.5–5.2)
SODIUM: 141 mmol/L (ref 134–144)
Total Protein: 6.8 g/dL (ref 6.0–8.5)

## 2017-06-18 LAB — CBC WITH DIFFERENTIAL/PLATELET
Basophils Absolute: 0 10*3/uL (ref 0.0–0.2)
Basos: 0 %
EOS (ABSOLUTE): 0.1 10*3/uL (ref 0.0–0.4)
Eos: 2 %
Hematocrit: 37.1 % (ref 34.0–46.6)
Hemoglobin: 12.1 g/dL (ref 11.1–15.9)
Immature Grans (Abs): 0 10*3/uL (ref 0.0–0.1)
Immature Granulocytes: 0 %
LYMPHS ABS: 1.5 10*3/uL (ref 0.7–3.1)
Lymphs: 22 %
MCH: 29.2 pg (ref 26.6–33.0)
MCHC: 32.6 g/dL (ref 31.5–35.7)
MCV: 90 fL (ref 79–97)
MONOCYTES: 8 %
MONOS ABS: 0.6 10*3/uL (ref 0.1–0.9)
Neutrophils Absolute: 4.6 10*3/uL (ref 1.4–7.0)
Neutrophils: 68 %
Platelets: 119 10*3/uL — ABNORMAL LOW (ref 150–379)
RBC: 4.14 x10E6/uL (ref 3.77–5.28)
RDW: 13.9 % (ref 12.3–15.4)
WBC: 6.8 10*3/uL (ref 3.4–10.8)

## 2017-06-18 LAB — TSH: TSH: 3.91 u[IU]/mL (ref 0.450–4.500)

## 2017-06-18 NOTE — Patient Instructions (Addendum)
Tasha [name] , Thank you for taking time to come for your Medicare Wellness Visit. I appreciate your ongoing commitment to your health goals. Please review the following plan we discussed and let me know if I can assist you in the future.   These are the goals we discussed: Goals    . Exercise 150 min/wk Moderate Activity       This is a list of the screening recommended for you and due dates:  Health Maintenance  Topic Date Due  . Colon Cancer Screening  06/20/2000  . Stool Blood Test  11/05/2017  . Mammogram  03/12/2019  . Tetanus Vaccine  04/23/2019  . Flu Shot  Completed  . DEXA scan (bone density measurement)  Completed  .  Hepatitis C: One time screening is recommended by Center for Disease Control  (CDC) for  adults born from [birth year] through 1965.   Completed  . Pneumonia vaccines  Completed    Dieta restringida en grasas y colesterol (Fat and Cholesterol Restricted Diet) El exceso de grasas y colesterol en la dieta puede causar problemas de [place]. Esta dieta lo ayudar a Theatre manager las grasas y Freight forwarder en los niveles normales para evitar enfermarse. QU TIPOS DE GRASAS DEBO ELEGIR?  Elija grasas monosaturadas y polinsaturadas. Estas se encuentran en alimentos como el aceite de oliva, aceite de canola, semillas de lino, nueces, almendras y semillas.  Consuma ms grasas omega-3. Las mejores opciones incluyen salmn, caballa, sardinas, atn, aceite de lino y semillas de lino molidas.  Limite el consumo de grasas saturadas, que se encuentran en productos de origen animal, como carnes, mantequilla y crema. Tambin pueden estar en productos vegetales, como aceite de palma, de palmiste y de coco.  Evite los alimentos con aceites parcialmente hidrogenados. Estos contienen grasas trans. Entre los ejemplos de alimentos con grasas trans se incluyen margarinas en barra, algunas margarinas untables, galletas dulces o saladas y otros productos horneados. [place]?  Lea las etiquetas de los alimentos. Busque las palabras "grasas trans" y "grasas saturadas".  Al preparar una comida: ? Llene la mitad del plato con verduras y ensaladas de hojas verdes. ? Llene un cuarto del plato con cereales integrales. Busque la palabra "integral" en Equities trader de la lista de ingredientes. ? Llene un cuarto del plato con alimentos con protenas magras.  Ingiera alimentos con ms fibra, como [place], [place], frijoles, guisantes y [place].  Coma ms comidas caseras. Coma menos en los restaurantes y los bares.  Limite o evite el alcohol.  Limite los alimentos con alto contenido de almidn y Location manager.  Limite el consumo de alimentos fritos.  Cocine los alimentos sin frerlos. Las opciones de coccin ms [place] son Teacher, music, Adult nurse, Interior and spatial designer y asar a Administrator, arts.  Baje de peso si es necesario. Aunque pierda Automatic Data, esto puede ser importante para la salud general. Tambin puede ayudar a prevenir enfermedades como diabetes y enfermedad cardaca. QU ALIMENTOS PUEDO COMER? Cereales Cereales integrales, como los panes de salvado o [place], las [place], los cereales y las pastas. Avena sin endulzar, trigo, [place], quinua o arroz integral. Tortillas de harina de maz o de salvado. Verduras Verduras frescas o congeladas (crudas, al vapor, asadas o grilladas). Ensaladas de hojas verdes. Lambert Mody Lambert Mody frescas, en conserva (en su jugo natural) o frutas congeladas. Carnes y otros productos con protenas Carne de res molida (al 85% o ms [place]), carne de res de animales alimentados con pastos o carne de res sin la grasa. Pollo  o pavo sin piel. Carne de pollo o de [place]. Cerdo sin la grasa. Todos los pescados y frutos de mar. Huevos. Porotos, guisantes o lentejas secos. Frutos secos o semillas sin sal. Frijoles secos o en lata sin sal. Lcteos Productos lcteos con bajo contenido de grasas, como [place] o al 1%, quesos reducidos en grasas o  al 2%, ricota con bajo contenido de grasas o [employer], o yogur natural con bajo contenido de [place]. Grasas y American Express untables que no contengan grasas trans. [place] y condimentos para ensaladas livianos o reducidos en grasas. [place]. Aceites de oliva, canola, ssamo o crtamo. Mantequilla natural de cacahuate o almendra (elija la que no tenga agregado de aceite o azcar). Los artculos mencionados arriba pueden no ser [employer] de las bebidas o los alimentos recomendados. Comunquese con el nutricionista para conocer ms opciones. QU ALIMENTOS NO SE RECOMIENDAN? Cereales Pan blanco. Pastas blancas. Arroz blanco. Pan de maz. Bagels, pasteles y croissants. Galletas saladas que contengan grasas trans. Verduras Papas blancas. MazHolland Commons con crema o fritas. Verduras en [place]. Lambert Mody Frutas secas. Fruta enlatada en almbar liviano o espeso. Jugo de frutas. Carnes y otros productos con protenas Cortes de carne con Lobbyist. Costillas, alas de pollo, tocineta, salchicha, mortadela, salame, chinchulines, tocino, perros calientes, salchichas alemanas y embutidos envasados. Hgado y otros rganos. Lcteos [place]he entera o al 2%, crema, mezcla de [place] y crema, y queso crema. Quesos enteros. Yogur entero o endulzado. Quesos con toda su grasa. Cremas no lcteas y coberturas batidas. Quesos procesados, quesos para untar o cuajadas. Dulces y postres Jarabe de maz, azcares, miel y Control and instrumentation engineer. Caramelos. Mermelada y [place]. Chrissie Noa. Cereales endulzados. Galletas, pasteles, bizcochuelos, donas, muffins y helado. Grasas y aceites [place], [place] en barra, [place] de [place], [place], [place] clarificada o grasa de tocino. Aceites de coco, de palmiste o de palma. Bebidas Alcohol. Bebidas endulzadas (como refrescos, limonadas y bebidas frutales o ponches). Los artculos mencionados arriba pueden no ser [employer] de las bebidas y los alimentos que se Chartered certified accountant. Comunquese con el nutricionista para obtener ms informacin. Esta informacin no tiene Marine scientist el consejo del mdico. Asegrese de hacerle al mdico cualquier pregunta que tenga. Document Released: 10/08/2011 Document Revised: 04/29/2014 Document Reviewed: 07/08/2013 Elsevier Interactive Patient Education  [employer].

## 2017-06-18 NOTE — Progress Notes (Addendum)
Subjective:   Tasha [name] is a 67 y.o. female who presents for an Initial Medicare Annual Wellness Visit.  Review of Systems    Health is better than last year  Cardiac Risk Factors include: family history of premature cardiovascular disease;obesity (BMI >30kg/m2);dyslipidemia;hypertension  Other systems negative    Objective:    BP 125/70   Pulse 85   Ht 5\' 5"  (1.651 m)   Wt 150 lb (68 kg)   BMI 24.96 kg/m   Current Medications (verified) Outpatient Encounter Medications as of 06/18/2017  Medication Sig  . atorvastatin (LIPITOR) 40 MG tablet Take 1 tablet (40 mg total) by mouth daily.  . cetirizine (ZYRTEC) 10 MG tablet Take 10 mg by mouth daily.  Marland Kitchen escitalopram (LEXAPRO) 10 MG tablet Take 1 tablet (10 mg total) by mouth daily.  Marland Kitchen lisinopril-hydrochlorothiazide (PRINZIDE,ZESTORETIC) 20-25 MG tablet Take 1 tablet by mouth daily.  . pantoprazole (PROTONIX) 20 MG tablet Take 1 tablet (20 mg total) by mouth daily.   No facility-administered encounter medications on file as of 06/18/2017.     Allergies (verified) Amoxicillin and Hydromet [hydrocodone-homatropine]   History: Past Medical History:  Diagnosis Date  . Hyperlipidemia   . Hypertension   . Metabolic syndrome    Past Surgical History:  Procedure Laterality Date  . CESAREAN SECTION     Family History  Problem Relation Age of Onset  . Cancer Father   . Healthy Brother   . Healthy Daughter   . Healthy Brother    Social History   Socioeconomic History  . Marital status: Married    Spouse name: Not on file  . Number of children: Not on file  . Years of education: Not on file  . Highest education level: Not on file  Social Needs  . Financial resource strain: Not hard at all  . Food insecurity - worry: Never true  . Food insecurity - inability: Never true  . Transportation needs - medical: No  . Transportation needs - non-medical: No  Occupational History  . Not on file  Tobacco Use  .  Smoking status: Never Smoker  Substance and Sexual Activity  . Alcohol use: No  . Drug use: No  . Sexual activity: Not on file  Other Topics Concern  . Not on file  Social History Narrative  . Not on file    Tobacco Counseling No tobacco use  Clinical Intake: Clinical Intake:     Pain : 0-10    Activities of Daily Living: Independent Ambulation: Independent Medication Administration: Independent Home Management: Independent     Do you feel unsafe in your current relationship?: No Do you feel physically threatened by others?: No Anyone hurting you at home, work, or school?: No Unable to ask?: No Information provided on Community resources: No  How often do you need to have someone help you when you read instructions, pamphlets, or other written materials from your doctor or pharmacy?: 1 - Never(reads Spanish)  Interpreter Needed?: Yes Interpreter Agency: [employer]pre[employer] Interpreter Name: Nevin Bloodgood Interpreter ID: [id number]  Information entered by :: Chong Sicilian, RN    Activities of Daily Living In your present state of health, do you have any difficulty performing the following activities: 06/18/2017  Hearing? N  Vision? N  Difficulty concentrating or making decisions? N  Walking or climbing stairs? N  Dressing or bathing? N  Doing errands, shopping? N  Preparing Food and eating ? N  Using the Toilet? N  In the  past six months, have you accidently leaked urine? N  Do you have problems with loss of bowel control? N  Managing your Medications? N  Managing your Finances? N  Housekeeping or managing your Housekeeping? N  Some recent data might be hidden     Immunizations and Health Maintenance Immunization History  Administered Date(s) Administered  . Influenza, High Dose Seasonal PF 03/11/2017  . Influenza,inj,Quad PF,6+ Mos 02/04/2014, 02/09/2015, 02/20/2016  . Pneumococcal Conjugate-13 08/10/2015  . Pneumococcal Polysaccharide-23  06/17/2017   Health Maintenance Due  Topic Date Due  . COLONOSCOPY  06/20/2000    Patient Care Team: Dettinger, Fransisca Kaufmann, MD as PCP - General (Family Medicine)  No hospitalizations, ER visits, or surgeries this past year.       Assessment:   This is a routine wellness examination for Tasha [name].  Hearing/Vision screen No deficits noted during visit.   Dietary issues and exercise activities discussed: Current Exercise Habits: Home exercise routine, Type of exercise: walking, Time (Minutes): 30, Frequency (Times/Week): 7, Weekly Exercise (Minutes/Week): 210, Intensity: Mild  Goals    . Exercise 150 min/wk Moderate Activity      Depression Screen PHQ 2/9 Scores 06/18/2017 06/17/2017 02/14/2017 10/15/2016 07/11/2016 04/12/2016 03/12/2016  PHQ - 2 Score 0 0 0 0 0 0 4  PHQ- 9 Score - - - - - - 12    Fall Risk Fall Risk  06/18/2017 06/17/2017 02/14/2017 10/15/2016 07/11/2016  Falls in the past year? No No No No No    Is the patient'Cognitive Function: MMSE - Mini Mental State Exam 06/18/2017  Orientation to time 4  Orientation to Place 4  Registration 3  Recall 3  Language- name 2 objects 1  Language- repeat 1  Copy design 1    Unable to complete due to language barrier and use of video interpreter.     Screening Tests Health Maintenance  Topic Date Due  . COLONOSCOPY  06/20/2000  . COLON CANCER SCREENING ANNUAL FOBT  11/05/2017  . MAMMOGRAM  03/12/2019  . TETANUS/TDAP  04/23/2019  . INFLUENZA VACCINE  Completed  . DEXA SCAN  Completed  . Hepatitis C Screening  Completed  . PNA vac Low Risk Adult  Completed      Plan:  Keep f/u with PCP Increase activity level. Aim for 150 minutes of moderate activity a week.  Low fat and low cholesterol diet provided. Watch portion sizes as well.    I have personally reviewed and noted the following in the patient's chart:   . Medical and social history . Use of alcohol, tobacco or illicit drugs  . Current medications and  supplements . Functional ability and status . Nutritional status . Physical activity . Advanced directives . List of other physicians . Hospitalizations, surgeries, and ER visits in previous 12 months . Vitals . Screenings to include cognitive, depression, and falls . Referrals and appointments  In addition, I have reviewed and discussed with patient certain preventive protocols, quality metrics, and best practice recommendations. A written personalized care plan for preventive services as well as general preventive health recommendations were provided to patient.     Chong Sicilian, RN   06/18/2017

## 2017-08-09 ENCOUNTER — Other Ambulatory Visit: Payer: Self-pay | Admitting: Family Medicine

## 2017-08-09 DIAGNOSIS — E785 Hyperlipidemia, unspecified: Secondary | ICD-10-CM

## 2017-10-29 IMAGING — DX DG KNEE 1-2V*L*
2 series · 2 of 2 positions shown · non-contrast
Comparison: None.

CLINICAL DATA: Bilateral knee pain after fall.

EXAM:
LEFT KNEE - 1-2 VIEW; RIGHT KNEE - 1-2 VIEW

[knee ap]
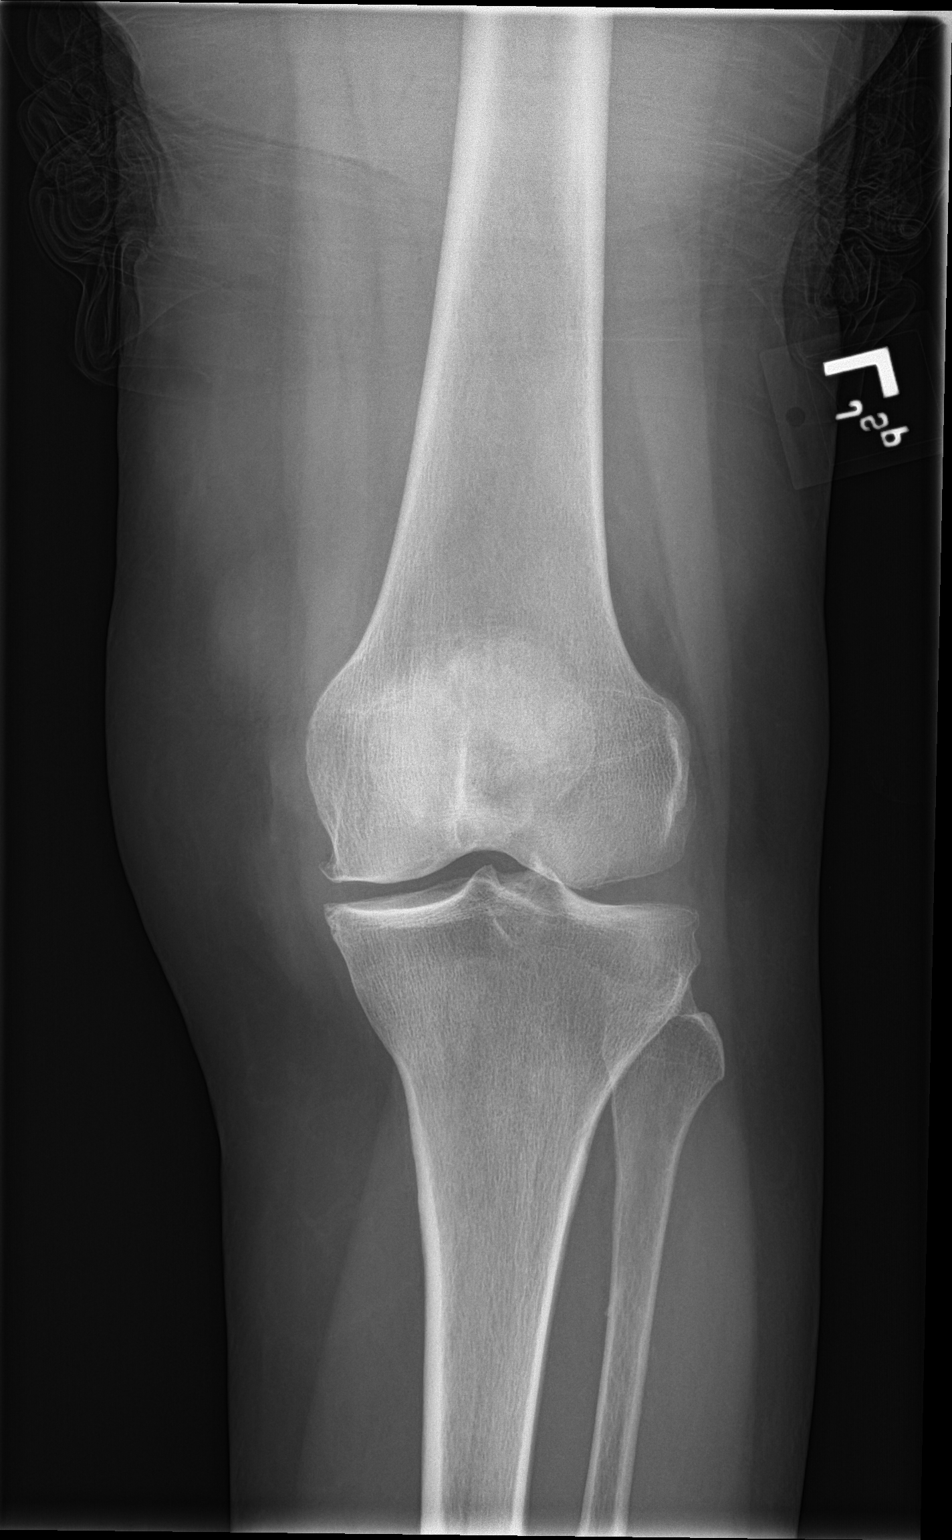

[knee lat]
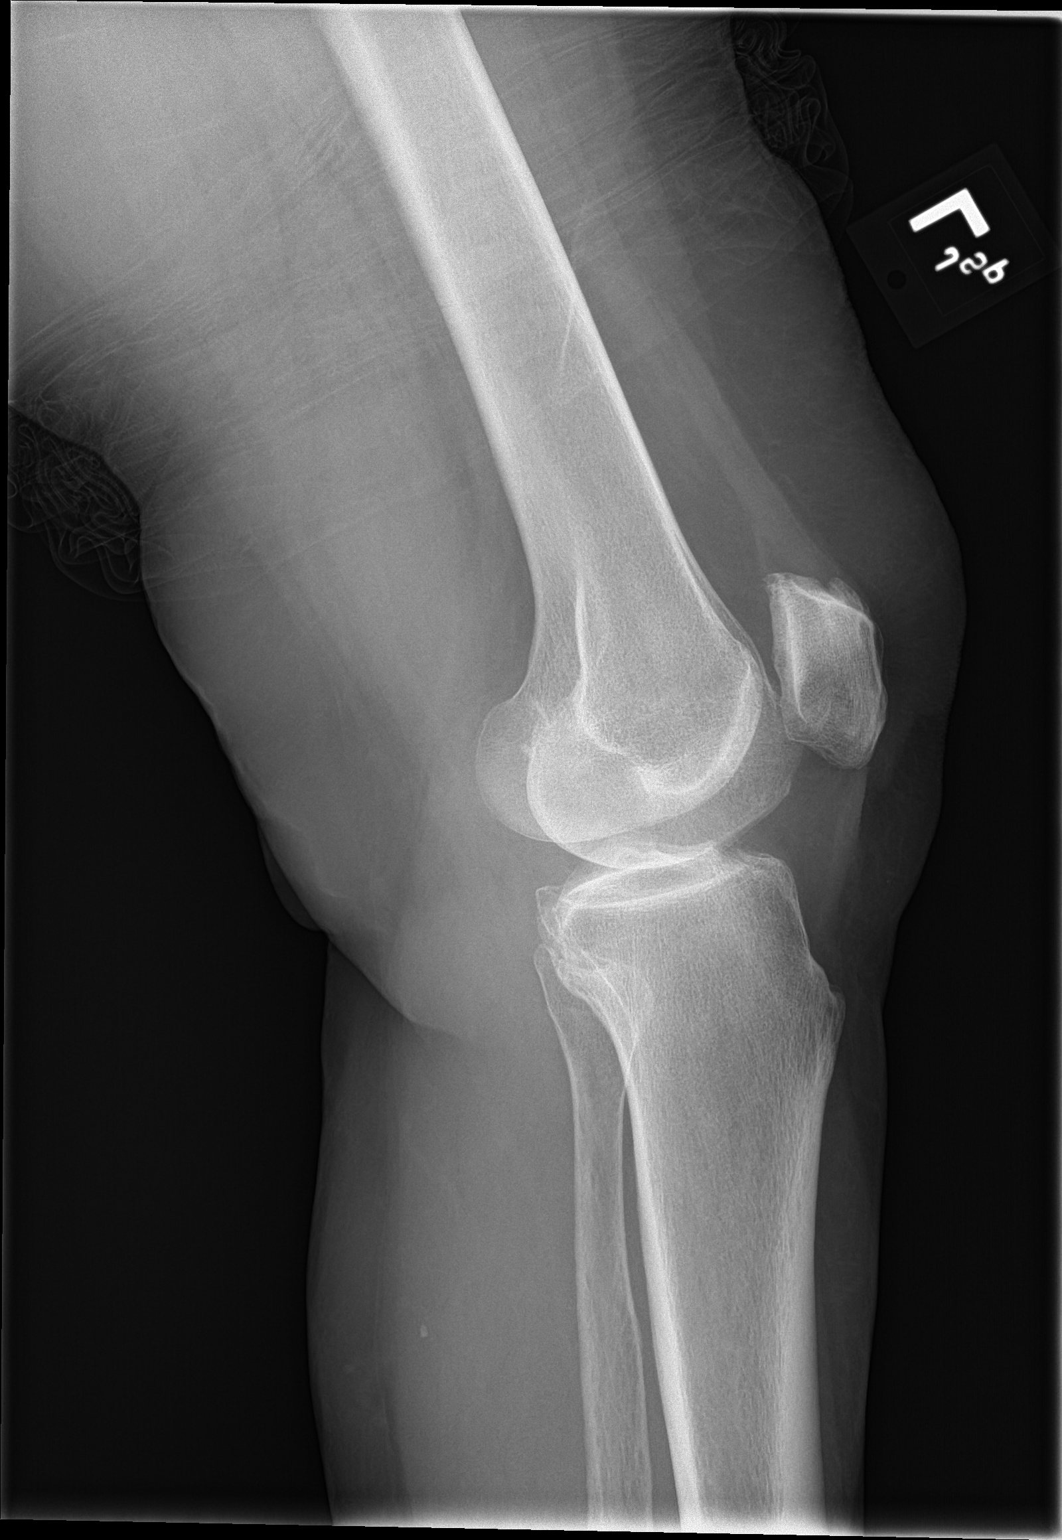

[2 of 2 positions shown; findings below may reference images not displayed]

FINDINGS: Left knee: No acute fracture or malalignment. Mild medial and
patellofemoral compartment degenerative changes with small
osteophytes. No joint effusion. Bone mineralization is normal. Soft
tissues are unremarkable.

Right knee: No acute fracture or malalignment. Tiny tricompartmental
osteophytes. Joint spaces are relatively preserved. No joint
effusion. Bone mineralization is normal. Soft tissues are
unremarkable.
IMPRESSION: No acute osseous abnormality.  Mild degenerative changes.

## 2017-11-07 ENCOUNTER — Other Ambulatory Visit: Payer: Self-pay | Admitting: Family Medicine

## 2017-11-14 ENCOUNTER — Encounter: Payer: Self-pay | Admitting: Pediatrics

## 2017-11-14 ENCOUNTER — Ambulatory Visit (INDEPENDENT_AMBULATORY_CARE_PROVIDER_SITE_OTHER): Payer: Medicare Other | Admitting: Pediatrics

## 2017-11-14 VITALS — BP 132/79 | HR 82 | Temp 98.2°F | Ht 65.0 in | Wt 153.4 lb

## 2017-11-14 DIAGNOSIS — R51 Headache: Secondary | ICD-10-CM

## 2017-11-14 DIAGNOSIS — H65112 Acute and subacute allergic otitis media (mucoid) (sanguinous) (serous), left ear: Secondary | ICD-10-CM

## 2017-11-14 DIAGNOSIS — R519 Headache, unspecified: Secondary | ICD-10-CM

## 2017-11-14 MED ORDER — AZITHROMYCIN 250 MG PO TABS: ORAL_TABLET | ORAL | 0 refills | Status: DC

## 2017-11-14 NOTE — Progress Notes (Signed)
  Subjective:   [name], [name]    DOB: 26-Sep-1950, 67 y.o.   MRN: [id number] CC: Headache  HPI: Tasha [name] is a 67 y.o. [name]   Here today with her son.  Past 5 days she has had a headache off and on.  She does get headaches, this is worse than her usual headaches.  She is also had some URI symptoms--Runny nose, congestion, cough, sore throat.  No fevers.  Appetite is been okay.  No nausea or vomiting.  She has been taking ibuprofen for the headache with minimal relief.  Because of the headache she has had to lie down and rest more than usual.  This does help relieve the symptoms.  Headache is all over her head.  Relevant past medical, surgical, family and social history reviewed. Allergies and medications reviewed and updated. Social History   Tobacco Use  Smoking Status Never Smoker   ROS: Per HPI   Objective:    BP 132/79   Pulse 82   Temp 98.2 F (36.8 C) (Oral)   Ht 5\' 5"  (1.651 m)   Wt 153 lb 6.4 oz (69.6 kg)   BMI 25.53 kg/m   Wt Readings from Last 3 Encounters:  11/14/17 153 lb 6.4 oz (69.6 kg)  06/18/17 150 lb (68 kg)  06/17/17 150 lb 6.4 oz (68.2 kg)    Gen: NAD, alert, cooperative with exam, NCAT EYES: EOMI, no conjunctival injection, or no icterus ENT:  TMs red, layering yellow effusion L>R, OP without erythema LYMPH: no cervical LAD CV: NRRR, normal S1/S2, no murmur, distal pulses 2+ b/l Resp: CTABL, no wheezes, normal WOB Abd: +BS, soft, NTND. no guarding or organomegaly Ext: No edema, warm Neuro: Alert and oriented, strength equal b/l UE and LE, CN III-XII intact, coordination grossly normal MSK: normal muscle bulk  Assessment & Plan:  Tasha [name] was seen today for headache.    Diagnoses and all orders for this visit:  Acute mucoid otitis media of left ear -     azithromycin (ZITHROMAX) 250 MG tablet; Take 2 the first day and then one each day after.  Nonintractable headache, unspecified chronicity pattern, unspecified headache  type Suspect headache related to upper respiratory infection that she has.  She has erythematous left TM, with layering effusion.  Will go ahead and treat for ear infection as above. Normal neuro exam.  Okay to take Tylenol in addition to 2 ibuprofen.  If not improving or for any worsening, let us know, may need imaging given new headache.  Follow up plan: As needed Assunta Found, MD [place]

## 2017-11-14 NOTE — Patient Instructions (Addendum)
Fever reducer and headache: tylenol and ibuprofen, can take together or alternating   Sinus pressure:  Nasal steroid such as flonase/fluticaone or nasocort daily Can also take daily antihistamine such as loratadine/claritin or cetirizine/zyrtec  Sinus rinses/irritation: Netipot or similar with distilled water 2-3 times a day to clear out sinuses or Normal saline nasal spray  Sore throat:  Throat lozenges chloroseptic spray  Stick with bland foods Drink lots of fluids  If not improving by next week and for any worsening call our office

## 2017-12-01 ENCOUNTER — Encounter: Payer: Self-pay | Admitting: Family

## 2017-12-01 ENCOUNTER — Ambulatory Visit (INDEPENDENT_AMBULATORY_CARE_PROVIDER_SITE_OTHER): Payer: Medicare Other | Admitting: Family

## 2017-12-01 ENCOUNTER — Ambulatory Visit (INDEPENDENT_AMBULATORY_CARE_PROVIDER_SITE_OTHER): Payer: Medicare Other

## 2017-12-01 VITALS — BP 133/87 | HR 88 | Temp 98.2°F | Ht 65.0 in | Wt 150.0 lb

## 2017-12-01 DIAGNOSIS — J189 Pneumonia, unspecified organism: Secondary | ICD-10-CM | POA: Diagnosis not present

## 2017-12-01 DIAGNOSIS — R059 Cough, unspecified: Secondary | ICD-10-CM

## 2017-12-01 DIAGNOSIS — J4520 Mild intermittent asthma, uncomplicated: Secondary | ICD-10-CM | POA: Diagnosis not present

## 2017-12-01 DIAGNOSIS — R05 Cough: Secondary | ICD-10-CM

## 2017-12-01 DIAGNOSIS — J4541 Moderate persistent asthma with (acute) exacerbation: Secondary | ICD-10-CM | POA: Diagnosis not present

## 2017-12-01 DIAGNOSIS — R0602 Shortness of breath: Secondary | ICD-10-CM | POA: Diagnosis not present

## 2017-12-01 MED ORDER — PREDNISONE 10 MG (21) PO TBPK: ORAL_TABLET | ORAL | 0 refills | Status: DC

## 2017-12-01 MED ORDER — ALBUTEROL SULFATE HFA 108 (90 BASE) MCG/ACT IN AERS: 2.0000 | INHALATION_SPRAY | Freq: Four times a day (QID) | RESPIRATORY_TRACT | 2 refills | Status: DC | PRN

## 2017-12-01 MED ORDER — DOXYCYCLINE HYCLATE 100 MG PO TABS: 100.0000 mg | ORAL_TABLET | Freq: Two times a day (BID) | ORAL | 0 refills | Status: DC

## 2017-12-01 NOTE — Addendum Note (Signed)
Addended by: Evelina Dun A on: 12/01/2017 09:55 AM   Modules accepted: Orders

## 2017-12-01 NOTE — Progress Notes (Addendum)
Subjective:    [name], female    DOB: [birth year], 67 y.o.   MRN: [id number]  Chief Complaint  Patient presents with  . Cough    wheezing    Cough  This is a new problem. The current episode started 1 to 4 weeks ago. The problem has been waxing and waning. The problem occurs every few minutes. The cough is non-productive. Associated symptoms include ear pain, headaches, a sore throat, shortness of breath and wheezing. Pertinent negatives include no chills, ear congestion, fever, nasal congestion or postnasal drip. She has tried rest for the symptoms. The treatment provided mild relief. There is no history of COPD.      Review of Systems  Constitutional: Negative for chills and fever.  HENT: Positive for ear pain and sore throat. Negative for postnasal drip.   Respiratory: Positive for cough, shortness of breath and wheezing.   Neurological: Positive for headaches.  All other systems reviewed and are negative.      Objective:   Physical Exam  Constitutional: She is oriented to person, place, and time. She appears well-developed and well-nourished. No distress.  HENT:  Head: Normocephalic and atraumatic.  Right Ear: External ear normal.  Left Ear: External ear normal.  Mouth/Throat: Oropharynx is clear and moist.  Eyes: Pupils are equal, round, and reactive to light.  Neck: Normal range of motion. Neck supple. No thyromegaly present.  Cardiovascular: Normal rate, regular rhythm, normal heart sounds and intact distal pulses.  No murmur heard. Pulmonary/Chest: Effort normal. No respiratory distress. She has decreased breath sounds. She has wheezes.  Abdominal: Soft. Bowel sounds are normal. She exhibits no distension. There is no tenderness.  Musculoskeletal: Normal range of motion. She exhibits no edema or tenderness.  Neurological: She is alert and oriented to person, place, and time. She has normal reflexes. No cranial nerve deficit.  Skin: Skin is warm  and dry.  Psychiatric: She has a normal mood and affect. Her behavior is normal. Judgment and thought content normal.  Vitals reviewed.    BP 133/87   Pulse 88   Temp 98.2 F (36.8 C) (Oral)   Ht 5\' 5"  (1.651 m)   Wt 150 lb (68 kg)   BMI 24.96 kg/m      Assessment & Plan:  1. Mild intermittent extrinsic asthma without complication - DG Chest 2 View; Future - doxycycline (VIBRA-TABS) 100 MG tablet; Take 1 tablet (100 mg total) by mouth 2 (two) times daily.  Dispense: 20 tablet; Refill: 0 - predniSONE (STERAPRED UNI-PAK 21 TAB) 10 MG (21) TBPK tablet; Use as directed  Dispense: 21 tablet; Refill: 0  2. Cough - DG Chest 2 View; Future - predniSONE (STERAPRED UNI-PAK 21 TAB) 10 MG (21) TBPK tablet; Use as directed  Dispense: 21 tablet; Refill: 0  3. Moderate persistent asthma with acute exacerbation - DG Chest 2 View; Future - doxycycline (VIBRA-TABS) 100 MG tablet; Take 1 tablet (100 mg total) by mouth 2 (two) times daily.  Dispense: 20 tablet; Refill: 0 - predniSONE (STERAPRED UNI-PAK 21 TAB) 10 MG (21) TBPK tablet; Use as directed  Dispense: 21 tablet; Refill: 0    4. Community acquired pneumonia, unspecified laterality Meds ordered this encounter  Medications  . doxycycline (VIBRA-TABS) 100 MG tablet    Sig: Take 1 tablet (100 mg total) by mouth 2 (two) times daily.    Dispense:  20 tablet    Refill:  0    Order Specific Question:   Supervising Provider  Answer:   Caryl Pina A [id number]  . predniSONE (STERAPRED UNI-PAK 21 TAB) 10 MG (21) TBPK tablet    Sig: Use as directed    Dispense:  21 tablet    Refill:  0    Order Specific Question:   Supervising Provider    Answer:   Caryl Pina A [id number]  . albuterol (PROVENTIL HFA;VENTOLIN HFA) 108 (90 Base) MCG/ACT inhaler    Sig: Inhale 2 puffs into the lungs every 6 (six) hours as needed for wheezing or shortness of breath.    Dispense:  1 Inhaler    Refill:  2    Order Specific Question:   Supervising  Provider    Answer:   Caryl Pina A [[id number]]     - Take meds as prescribed - Use a cool mist humidifier  -Use saline nose sprays frequently -Force fluids -For any cough or congestion  Use plain Mucinex- regular strength or max strength is fine -For fever or aces or pains- take tylenol or ibuprofen. -Throat lozenges if help -RTO if symptoms worsen or do not improve, Keep follow up with PCP   Evelina Dun, FNP

## 2017-12-01 NOTE — Addendum Note (Signed)
Addended by: Evelina Dun A on: 12/01/2017 10:01 AM   Modules accepted: Level of Service

## 2017-12-01 NOTE — Patient Instructions (Signed)
Bronquitis aguda en los adultos Acute Bronchitis, Adult La bronquitis aguda es la hinchazn repentina de las vas areas (bronquios) en los pulmones. Esta afeccin puede dificultar la respiracin. Adems, puede provocar los siguientes sntomas:  Tos.  Despedir Ardelia Mems mucosidad transparente, amarilla o verde al toser.  Sibilancias.  Congestin en el pecho.  Falta de aire.  Cristy Hilts.  Dolores [employer] cuerpo.  Escalofros.  Dolor de Investment banker, operational.  Siga estas indicaciones en su casa: Medicamentos  [employer] de venta libre y los recetados solamente como se lo haya indicado el mdico.  Si le recetaron un antibitico, tmelo como se lo haya indicado el mdico. No deje de tomar los antibiticos aunque comience a Sports administrator. Instrucciones generales  Haga reposo.  Beba suficiente lquido para mantener el pis (orina) claro o de color amarillo plido.  Evite fumar o aspirar el humo de otros fumadores. Si necesita ayuda para dejar de fumar, consulte al mdico. Dejar de fumar ayudar a que los pulmones se curen ms rpido.  Use un inhalador, un humidificador o un vaporizador de vapor fro tal como se lo haya indicado el mdico.  Concurra a todas las visitas de control como se lo haya indicado el mdico. Esto es importante. Cmo se evita? Para disminuir el riesgo de volver a sufrir esta afeccin:  Lvese las manos frecuentemente con agua y [place]. Use un desinfectante para manos si no dispone de [place] y [place].  Evite el contacto con personas que tienen sntomas de resfro.  Trate de no llevar las manos a la boca, la nariz o los ojos.  Recuerde aplicarse la vacuna contra la gripe todos los [place].  Comunquese con un mdico si:  Los sntomas no mejoran en un plazo de 2semanas. Solicite ayuda de inmediato si:  Tose y [employer].  Siente dolor en el pecho.  Sufre un episodio muy intenso de falta de aire.  Se deshidrata.  Se desmaya (pierde el conocimiento) o tiene  una sensacin constante de desvanecimiento.  No deja de vomitar.  Siente un dolor de cabeza muy intenso.  La fiebre o los escalofros empeoran. Esta informacin no tiene Marine scientist el consejo del mdico. Asegrese de hacerle al mdico cualquier pregunta que tenga. Document Released: 12/09/2012 Document Revised: 07/03/2016 Document Reviewed: 09/27/2015 Elsevier Interactive Patient Education  [employer].

## 2017-12-05 ENCOUNTER — Encounter: Payer: Self-pay | Admitting: Family Medicine

## 2017-12-05 ENCOUNTER — Ambulatory Visit (INDEPENDENT_AMBULATORY_CARE_PROVIDER_SITE_OTHER): Payer: Medicare Other | Admitting: Family Medicine

## 2017-12-05 VITALS — BP 134/83 | HR 86 | Temp 99.0°F | Ht 65.0 in | Wt 151.0 lb

## 2017-12-05 DIAGNOSIS — J189 Pneumonia, unspecified organism: Secondary | ICD-10-CM

## 2017-12-05 DIAGNOSIS — J181 Lobar pneumonia, unspecified organism: Secondary | ICD-10-CM

## 2017-12-05 MED ORDER — BENZONATATE 200 MG PO CAPS: 200.0000 mg | ORAL_CAPSULE | Freq: Two times a day (BID) | ORAL | 0 refills | Status: DC | PRN

## 2017-12-05 MED ORDER — LEVOFLOXACIN 500 MG PO TABS: 500.0000 mg | ORAL_TABLET | Freq: Every day | ORAL | 0 refills | Status: AC

## 2017-12-05 NOTE — Progress Notes (Signed)
Subjective: CC: Pneumonia PCP: Dettinger, Fransisca Kaufmann, MD JOI:NOMVEHM Tasha [name] is a 67 y.o. female presenting to clinic today for:  [employer] video interpreter [place], [place] used for Spanish translation of this visit   1.  Pneumonia Patient was seen on 12/01/2017 for a persistent cough.  She was status post treatment with a Z-Pak about 2 weeks prior for an ear issue.  She was treated with prednisone, albuterol and doxycycline.  Patient reports that she is gotten no better on medication.  She reports fatigue, dry cough, sensation of shortness of breath.  No measured fevers, hemoptysis.  She is completed prednisone but has a few days left of doxycycline.   ROS: Per HPI  Allergies  Allergen Reactions  . Amoxicillin   . Hydromet [Hydrocodone-Homatropine]    Past Medical History:  Diagnosis Date  . Hyperlipidemia   . Hypertension   . Metabolic syndrome     Current Outpatient Medications:  .  albuterol (PROVENTIL HFA;VENTOLIN HFA) 108 (90 Base) MCG/ACT inhaler, Inhale 2 puffs into the lungs every 6 (six) hours as needed for wheezing or shortness of breath., Disp: 1 Inhaler, Rfl: 2 .  atorvastatin (LIPITOR) 40 MG tablet, TAKE (1) TABLET BY MOUTH ONCE DAILY., Disp: 30 tablet, Rfl: 2 .  azithromycin (ZITHROMAX) 250 MG tablet, Take 2 the first day and then one each day after., Disp: 6 tablet, Rfl: 0 .  cetirizine (ZYRTEC) 10 MG tablet, Take 10 mg by mouth daily., Disp: , Rfl:  .  doxycycline (VIBRA-TABS) 100 MG tablet, Take 1 tablet (100 mg total) by mouth 2 (two) times daily., Disp: 20 tablet, Rfl: 0 .  escitalopram (LEXAPRO) 10 MG tablet, TAKE (1) TABLET BY MOUTH ONCE DAILY., Disp: 30 tablet, Rfl: 0 .  lisinopril-hydrochlorothiazide (PRINZIDE,ZESTORETIC) 20-25 MG tablet, TAKE (1) TABLET BY MOUTH ONCE DAILY., Disp: 30 tablet, Rfl: 0 .  pantoprazole (PROTONIX) 20 MG tablet, TAKE (1) TABLET BY MOUTH ONCE DAILY., Disp: 30 tablet, Rfl: 2 .  predniSONE (STERAPRED UNI-PAK 21 TAB) 10 MG (21)  TBPK tablet, Use as directed, Disp: 21 tablet, Rfl: 0 Social History   Socioeconomic History  . Marital status: Married    Spouse name: Not on file  . Number of children: Not on file  . Years of education: Not on file  . Highest education level: Not on file  Occupational History  . Not on file  Social Needs  . Financial resource strain: Not hard at all  . Food insecurity:    Worry: Never true    Inability: Never true  . Transportation needs:    Medical: No    Non-medical: No  Tobacco Use  . Smoking status: Never Smoker  . Smokeless tobacco: Never Used  Substance and Sexual Activity  . Alcohol use: No  . Drug use: No  . Sexual activity: Not on file  Lifestyle  . Physical activity:    Days per week: 7 days    Minutes per session: 30 min  . Stress: Only a little  Relationships  . Social connections:    Talks on phone: Never    Gets together: Never    Attends religious service: More than 4 times per year    Active member of club or organization: Yes    Attends meetings of clubs or organizations: More than 4 times per year    Relationship status: Married  . Intimate partner violence:    Fear of current or ex partner: No    Emotionally abused: No  Physically abused: No    Forced sexual activity: No  Other Topics Concern  . Not on file  Social History Narrative  . Not on file   Family History  Problem Relation Age of Onset  . Cancer Father   . Healthy Brother   . Healthy Daughter   . Healthy Brother     Objective: Office vital signs reviewed. BP 134/83   Pulse 86   Temp 99 F (37.2 C) (Oral)   Ht 5\' 5"  (1.651 m)   Wt 151 lb (68.5 kg)   SpO2 95%   BMI 25.13 kg/m   Physical Examination:  General: Awake, alert, nontoxic but tired appearing, No acute distress HEENT: Slightly dry mucous membranes. Cardio: regular rate and rhythm, S1S2 heard, no murmurs appreciated Pulm: Decreased breath sounds on the right compared to the left.  Otherwise, no wheezes,  rhonchi or rales; normal work of breathing on room air  Dg Chest 2 View  Result Date: 12/01/2017 CLINICAL DATA:  Cough and shortness of breath for 2 weeks EXAM: CHEST - 2 VIEW COMPARISON:  August 10, 2015 FINDINGS: The heart size and mediastinal contours are within normal limits. Mild increased pulmonary interstitium in is identified bilaterally. Patchy consolidation of bilateral mid lung and lung bases are noted. There is probable minimal right pleural effusion. The bony structures are stable. IMPRESSION: Mild increased pulmonary interstitium with patchy consolidation of bilateral mid and lung bases suggesting infectious or inflammatory etiology. Electronically Signed   By: Abelardo Diesel M.D.   On: 12/01/2017 09:46    Assessment/ Plan: 67 y.o. female   1. Pneumonia of both lower lobes due to infectious organism [facility]) I reviewed her most recent chest x-ray from 12/01/2017 which demonstrated bilateral basilar patchy consolidation suggestive of infection versus inflammation.  Symptoms are refractory to treatment with doxycycline.  Her vitals are stable with normal oxygenation on room air.  She has got a low-grade fever to 99.0 F here in office.  She does appear tired but nontoxic.  Given her lung exam we will proceed with treatment with Levaquin 500 mg daily for 7 days.  We discussed that this is to replace her doxycycline.  She is to continue the albuterol inhaler if needed for shortness of breath.  We discussed that if symptoms are not significantly improving over the next 2 days she is to seek immediate medical attention the emergency department for further evaluation.  Tessalon Perles sent for cough.  Both the patient and her daughter in law voiced good understanding of plan.  Instructions were reviewed in Spanish using a Spanish interpreter.  Meds ordered this encounter  Medications  . levofloxacin (LEVAQUIN) 500 MG tablet    Sig: Take 1 tablet (500 mg total) by mouth daily for 7 days.     Dispense:  7 tablet    Refill:  0  . benzonatate (TESSALON) 200 MG capsule    Sig: Take 1 capsule (200 mg total) by mouth 2 (two) times daily as needed for cough.    Dispense:  20 capsule    Refill:  [place], DO [place] [phone]

## 2017-12-05 NOTE — Patient Instructions (Signed)
Neumona extrahospitalaria en los adultos  (Community-Acquired Pneumonia, Adult)  La neumona es una infeccin en los pulmones. La neumona puede contagiarse mientras una persona est hospitalizada. Cuando una persona no est en el hospital, puede tener un tipo diferente de la enfermedad (neumona extrahospitalaria), la cual se transmite fcilmente de una persona a otra. Una persona puede contraer neumona extrahospitalaria si respira cerca de un individuo infectado que tose o estornuda. Algunos sntomas incluyen lo siguiente:   Tos seca.   Tos con expectoracin (productiva).   Fiebre.   Sudoracin.   Dolor en el pecho.  CUIDADOS EN EL HOGAR   Tome los medicamentos de venta libre y los recetados solamente como se lo haya indicado el mdico.  ? Tome los medicamentos para la tos solamente si no puede dormir bien.  ? Si le recetaron un antibitico, tmelo como se lo haya indicado el mdico. No deje de tomar los antibiticos aunque comience a sentirse mejor.   Duerma con la cabeza y el cuello elevados. Para lograrlo, colquese algunas almohadas debajo de la cabeza, o bien puede dormir en un silln reclinable.   No consuma productos que contengan tabaco. Estos incluyen cigarrillos, tabaco para mascar y cigarrillos electrnicos. Si necesita ayuda para dejar de fumar, consulte al mdico.   Beba suficiente agua para mantener la orina clara o de color amarillo plido.  Una vacuna puede ayudar a prevenir la neumona. Habitualmente, la vacuna se recomienda a:   Las personas mayores de 65aos.   Las personas mayores de 19aos:  ? Las personas que estn recibiendo tratamiento para el cncer.  ? Las personas que tienen una enfermedad pulmonar a largo plazo (crnica).  ? Las personas que tienen problemas del sistema de defensa del organismo (sistema inmunitario).  Tambin puede evitar la neumona si toma las siguientes medidas:   Se aplica la vacuna antigripal todos los aos.   Visita al dentista con la frecuencia  indicada.   Se lava las manos a menudo. Utilice un desinfectante para manos si no dispone de agua y jabn.  SOLICITE AYUDA SI:   Tiene fiebre.   No puede dormir bien porque el medicamento para la tos no resulta eficaz.  SOLICITE AYUDA DE INMEDIATO SI:   Le falta el aire y este sntoma empeora.   Aumenta el dolor en el pecho.   La enfermedad empeora. Esto es muy grave si:  ? Es un adulto mayor.  ? El sistema de defensa del organismo est debilitado.   Tose y escupe sangre.  Esta informacin no tiene como fin reemplazar el consejo del mdico. Asegrese de hacerle al mdico cualquier pregunta que tenga.  Document Released: 09/26/2009 Document Revised: 12/28/2014 Document Reviewed: 08/03/2014  Elsevier Interactive Patient Education  2018 Elsevier Inc.

## 2017-12-06 ENCOUNTER — Other Ambulatory Visit: Payer: Self-pay | Admitting: Family Medicine

## 2017-12-07 DIAGNOSIS — R918 Other nonspecific abnormal finding of lung field: Secondary | ICD-10-CM | POA: Diagnosis not present

## 2017-12-07 DIAGNOSIS — J15212 Pneumonia due to Methicillin resistant Staphylococcus aureus: Secondary | ICD-10-CM | POA: Diagnosis present

## 2017-12-07 DIAGNOSIS — R7989 Other specified abnormal findings of blood chemistry: Secondary | ICD-10-CM | POA: Diagnosis not present

## 2017-12-07 DIAGNOSIS — R0602 Shortness of breath: Secondary | ICD-10-CM | POA: Diagnosis not present

## 2017-12-07 DIAGNOSIS — R0902 Hypoxemia: Secondary | ICD-10-CM | POA: Diagnosis not present

## 2017-12-07 DIAGNOSIS — J168 Pneumonia due to other specified infectious organisms: Secondary | ICD-10-CM | POA: Diagnosis not present

## 2017-12-07 DIAGNOSIS — Z79899 Other long term (current) drug therapy: Secondary | ICD-10-CM | POA: Diagnosis not present

## 2017-12-07 DIAGNOSIS — I10 Essential (primary) hypertension: Secondary | ICD-10-CM | POA: Diagnosis not present

## 2017-12-07 DIAGNOSIS — J189 Pneumonia, unspecified organism: Secondary | ICD-10-CM | POA: Diagnosis not present

## 2017-12-07 DIAGNOSIS — E785 Hyperlipidemia, unspecified: Secondary | ICD-10-CM | POA: Diagnosis present

## 2017-12-07 DIAGNOSIS — J156 Pneumonia due to other aerobic Gram-negative bacteria: Secondary | ICD-10-CM | POA: Diagnosis not present

## 2017-12-07 DIAGNOSIS — K219 Gastro-esophageal reflux disease without esophagitis: Secondary | ICD-10-CM | POA: Diagnosis present

## 2017-12-07 DIAGNOSIS — F329 Major depressive disorder, single episode, unspecified: Secondary | ICD-10-CM | POA: Diagnosis not present

## 2017-12-07 DIAGNOSIS — F411 Generalized anxiety disorder: Secondary | ICD-10-CM | POA: Diagnosis present

## 2017-12-07 DIAGNOSIS — J9601 Acute respiratory failure with hypoxia: Secondary | ICD-10-CM | POA: Diagnosis present

## 2017-12-16 ENCOUNTER — Inpatient Hospital Stay (HOSPITAL_COMMUNITY)
Admission: AD | Admit: 2017-12-16 | Discharge: 2017-12-30 | DRG: 207 | Disposition: A | Discharge status: Rehabilitation Facility | Payer: Medicare Other | Source: Other Acute Inpatient Hospital | Attending: Internal Medicine | Admitting: Internal Medicine

## 2017-12-16 ENCOUNTER — Encounter (HOSPITAL_COMMUNITY): Payer: Self-pay

## 2017-12-16 ENCOUNTER — Other Ambulatory Visit: Payer: Self-pay

## 2017-12-16 DIAGNOSIS — N179 Acute kidney failure, unspecified: Secondary | ICD-10-CM | POA: Diagnosis not present

## 2017-12-16 DIAGNOSIS — R579 Shock, unspecified: Secondary | ICD-10-CM | POA: Diagnosis not present

## 2017-12-16 DIAGNOSIS — J8489 Other specified interstitial pulmonary diseases: Secondary | ICD-10-CM | POA: Diagnosis not present

## 2017-12-16 DIAGNOSIS — R7303 Prediabetes: Secondary | ICD-10-CM | POA: Diagnosis not present

## 2017-12-16 DIAGNOSIS — Z9911 Dependence on respirator [ventilator] status: Secondary | ICD-10-CM

## 2017-12-16 DIAGNOSIS — D696 Thrombocytopenia, unspecified: Secondary | ICD-10-CM | POA: Diagnosis not present

## 2017-12-16 DIAGNOSIS — Z9981 Dependence on supplemental oxygen: Secondary | ICD-10-CM | POA: Diagnosis not present

## 2017-12-16 DIAGNOSIS — R0602 Shortness of breath: Secondary | ICD-10-CM

## 2017-12-16 DIAGNOSIS — T518X1A Toxic effect of other alcohols, accidental (unintentional), initial encounter: Secondary | ICD-10-CM | POA: Diagnosis present

## 2017-12-16 DIAGNOSIS — I10 Essential (primary) hypertension: Secondary | ICD-10-CM | POA: Diagnosis present

## 2017-12-16 DIAGNOSIS — Z789 Other specified health status: Secondary | ICD-10-CM | POA: Diagnosis not present

## 2017-12-16 DIAGNOSIS — R739 Hyperglycemia, unspecified: Secondary | ICD-10-CM

## 2017-12-16 DIAGNOSIS — Z452 Encounter for adjustment and management of vascular access device: Secondary | ICD-10-CM | POA: Diagnosis not present

## 2017-12-16 DIAGNOSIS — J189 Pneumonia, unspecified organism: Secondary | ICD-10-CM | POA: Diagnosis present

## 2017-12-16 DIAGNOSIS — Z888 Allergy status to other drugs, medicaments and biological substances status: Secondary | ICD-10-CM

## 2017-12-16 DIAGNOSIS — J470 Bronchiectasis with acute lower respiratory infection: Secondary | ICD-10-CM | POA: Diagnosis not present

## 2017-12-16 DIAGNOSIS — E46 Unspecified protein-calorie malnutrition: Secondary | ICD-10-CM

## 2017-12-16 DIAGNOSIS — Z4682 Encounter for fitting and adjustment of non-vascular catheter: Secondary | ICD-10-CM | POA: Diagnosis not present

## 2017-12-16 DIAGNOSIS — J9620 Acute and chronic respiratory failure, unspecified whether with hypoxia or hypercapnia: Secondary | ICD-10-CM | POA: Diagnosis not present

## 2017-12-16 DIAGNOSIS — F419 Anxiety disorder, unspecified: Secondary | ICD-10-CM | POA: Diagnosis not present

## 2017-12-16 DIAGNOSIS — J9600 Acute respiratory failure, unspecified whether with hypoxia or hypercapnia: Secondary | ICD-10-CM | POA: Diagnosis present

## 2017-12-16 DIAGNOSIS — Z4659 Encounter for fitting and adjustment of other gastrointestinal appliance and device: Secondary | ICD-10-CM

## 2017-12-16 DIAGNOSIS — E162 Hypoglycemia, unspecified: Secondary | ICD-10-CM | POA: Diagnosis present

## 2017-12-16 DIAGNOSIS — D62 Acute posthemorrhagic anemia: Secondary | ICD-10-CM | POA: Diagnosis not present

## 2017-12-16 DIAGNOSIS — E876 Hypokalemia: Secondary | ICD-10-CM | POA: Diagnosis not present

## 2017-12-16 DIAGNOSIS — Z9289 Personal history of other medical treatment: Secondary | ICD-10-CM

## 2017-12-16 DIAGNOSIS — E870 Hyperosmolality and hypernatremia: Secondary | ICD-10-CM | POA: Diagnosis not present

## 2017-12-16 DIAGNOSIS — Z6825 Body mass index (BMI) 25.0-25.9, adult: Secondary | ICD-10-CM

## 2017-12-16 DIAGNOSIS — I110 Hypertensive heart disease with heart failure: Secondary | ICD-10-CM | POA: Diagnosis present

## 2017-12-16 DIAGNOSIS — I5030 Unspecified diastolic (congestive) heart failure: Secondary | ICD-10-CM | POA: Diagnosis not present

## 2017-12-16 DIAGNOSIS — Z7951 Long term (current) use of inhaled steroids: Secondary | ICD-10-CM | POA: Diagnosis not present

## 2017-12-16 DIAGNOSIS — J849 Interstitial pulmonary disease, unspecified: Secondary | ICD-10-CM | POA: Diagnosis not present

## 2017-12-16 DIAGNOSIS — M170 Bilateral primary osteoarthritis of knee: Secondary | ICD-10-CM | POA: Diagnosis not present

## 2017-12-16 DIAGNOSIS — J939 Pneumothorax, unspecified: Secondary | ICD-10-CM | POA: Diagnosis not present

## 2017-12-16 DIAGNOSIS — E872 Acidosis: Secondary | ICD-10-CM | POA: Diagnosis not present

## 2017-12-16 DIAGNOSIS — R768 Other specified abnormal immunological findings in serum: Secondary | ICD-10-CM | POA: Diagnosis not present

## 2017-12-16 DIAGNOSIS — K219 Gastro-esophageal reflux disease without esophagitis: Secondary | ICD-10-CM | POA: Diagnosis present

## 2017-12-16 DIAGNOSIS — Z881 Allergy status to other antibiotic agents status: Secondary | ICD-10-CM

## 2017-12-16 DIAGNOSIS — Z8679 Personal history of other diseases of the circulatory system: Secondary | ICD-10-CM | POA: Diagnosis not present

## 2017-12-16 DIAGNOSIS — I471 Supraventricular tachycardia: Secondary | ICD-10-CM | POA: Diagnosis not present

## 2017-12-16 DIAGNOSIS — Z1211 Encounter for screening for malignant neoplasm of colon: Secondary | ICD-10-CM

## 2017-12-16 DIAGNOSIS — R1310 Dysphagia, unspecified: Secondary | ICD-10-CM | POA: Diagnosis not present

## 2017-12-16 DIAGNOSIS — E871 Hypo-osmolality and hyponatremia: Secondary | ICD-10-CM | POA: Diagnosis not present

## 2017-12-16 DIAGNOSIS — R946 Abnormal results of thyroid function studies: Secondary | ICD-10-CM | POA: Diagnosis present

## 2017-12-16 DIAGNOSIS — R7989 Other specified abnormal findings of blood chemistry: Secondary | ICD-10-CM | POA: Diagnosis not present

## 2017-12-16 DIAGNOSIS — J80 Acute respiratory distress syndrome: Principal | ICD-10-CM | POA: Diagnosis present

## 2017-12-16 DIAGNOSIS — Z79899 Other long term (current) drug therapy: Secondary | ICD-10-CM

## 2017-12-16 DIAGNOSIS — J156 Pneumonia due to other aerobic Gram-negative bacteria: Secondary | ICD-10-CM | POA: Diagnosis not present

## 2017-12-16 DIAGNOSIS — J90 Pleural effusion, not elsewhere classified: Secondary | ICD-10-CM | POA: Diagnosis not present

## 2017-12-16 DIAGNOSIS — T380X5A Adverse effect of glucocorticoids and synthetic analogues, initial encounter: Secondary | ICD-10-CM | POA: Diagnosis not present

## 2017-12-16 DIAGNOSIS — I5033 Acute on chronic diastolic (congestive) heart failure: Secondary | ICD-10-CM | POA: Diagnosis not present

## 2017-12-16 DIAGNOSIS — J9621 Acute and chronic respiratory failure with hypoxia: Secondary | ICD-10-CM | POA: Diagnosis not present

## 2017-12-16 DIAGNOSIS — Z9689 Presence of other specified functional implants: Secondary | ICD-10-CM | POA: Diagnosis not present

## 2017-12-16 DIAGNOSIS — J9811 Atelectasis: Secondary | ICD-10-CM | POA: Diagnosis not present

## 2017-12-16 DIAGNOSIS — I4891 Unspecified atrial fibrillation: Secondary | ICD-10-CM | POA: Diagnosis not present

## 2017-12-16 DIAGNOSIS — R740 Nonspecific elevation of levels of transaminase and lactic acid dehydrogenase [LDH]: Secondary | ICD-10-CM | POA: Diagnosis not present

## 2017-12-16 DIAGNOSIS — Z23 Encounter for immunization: Secondary | ICD-10-CM | POA: Diagnosis not present

## 2017-12-16 DIAGNOSIS — E8881 Metabolic syndrome: Secondary | ICD-10-CM

## 2017-12-16 DIAGNOSIS — R0682 Tachypnea, not elsewhere classified: Secondary | ICD-10-CM | POA: Diagnosis not present

## 2017-12-16 DIAGNOSIS — R5381 Other malaise: Secondary | ICD-10-CM | POA: Diagnosis not present

## 2017-12-16 DIAGNOSIS — T7029XS Other effects of high altitude, sequela: Secondary | ICD-10-CM | POA: Diagnosis not present

## 2017-12-16 DIAGNOSIS — J982 Interstitial emphysema: Secondary | ICD-10-CM | POA: Diagnosis present

## 2017-12-16 DIAGNOSIS — J45909 Unspecified asthma, uncomplicated: Secondary | ICD-10-CM | POA: Diagnosis present

## 2017-12-16 DIAGNOSIS — J9690 Respiratory failure, unspecified, unspecified whether with hypoxia or hypercapnia: Secondary | ICD-10-CM | POA: Diagnosis not present

## 2017-12-16 DIAGNOSIS — E785 Hyperlipidemia, unspecified: Secondary | ICD-10-CM | POA: Diagnosis not present

## 2017-12-16 DIAGNOSIS — R040 Epistaxis: Secondary | ICD-10-CM | POA: Diagnosis not present

## 2017-12-16 DIAGNOSIS — E8809 Other disorders of plasma-protein metabolism, not elsewhere classified: Secondary | ICD-10-CM

## 2017-12-16 DIAGNOSIS — D638 Anemia in other chronic diseases classified elsewhere: Secondary | ICD-10-CM | POA: Diagnosis present

## 2017-12-16 DIAGNOSIS — R339 Retention of urine, unspecified: Secondary | ICD-10-CM | POA: Diagnosis not present

## 2017-12-16 DIAGNOSIS — I481 Persistent atrial fibrillation: Secondary | ICD-10-CM | POA: Diagnosis not present

## 2017-12-16 DIAGNOSIS — I959 Hypotension, unspecified: Secondary | ICD-10-CM | POA: Diagnosis not present

## 2017-12-16 DIAGNOSIS — R05 Cough: Secondary | ICD-10-CM | POA: Diagnosis not present

## 2017-12-16 DIAGNOSIS — J9602 Acute respiratory failure with hypercapnia: Secondary | ICD-10-CM | POA: Diagnosis not present

## 2017-12-16 DIAGNOSIS — R918 Other nonspecific abnormal finding of lung field: Secondary | ICD-10-CM | POA: Diagnosis not present

## 2017-12-16 DIAGNOSIS — T7029XA Other effects of high altitude, initial encounter: Secondary | ICD-10-CM

## 2017-12-16 DIAGNOSIS — I272 Pulmonary hypertension, unspecified: Secondary | ICD-10-CM | POA: Diagnosis present

## 2017-12-16 DIAGNOSIS — T797XXA Traumatic subcutaneous emphysema, initial encounter: Secondary | ICD-10-CM

## 2017-12-16 DIAGNOSIS — J9601 Acute respiratory failure with hypoxia: Secondary | ICD-10-CM | POA: Diagnosis not present

## 2017-12-16 HISTORY — DX: Acute respiratory failure, unspecified whether with hypoxia or hypercapnia: J96.00

## 2017-12-16 HISTORY — DX: Pneumonia, unspecified organism: J18.9

## 2017-12-16 LAB — RESPIRATORY PANEL BY PCR
ADENOVIRUS-RVPPCR: NOT DETECTED
BORDETELLA PERTUSSIS-RVPCR: NOT DETECTED
CORONAVIRUS HKU1-RVPPCR: NOT DETECTED
CORONAVIRUS NL63-RVPPCR: NOT DETECTED
Chlamydophila pneumoniae: NOT DETECTED
Coronavirus 229E: NOT DETECTED
Coronavirus OC43: NOT DETECTED
Influenza A: NOT DETECTED
Influenza B: NOT DETECTED
METAPNEUMOVIRUS-RVPPCR: NOT DETECTED
Mycoplasma pneumoniae: NOT DETECTED
PARAINFLUENZA VIRUS 2-RVPPCR: NOT DETECTED
PARAINFLUENZA VIRUS 3-RVPPCR: NOT DETECTED
Parainfluenza Virus 1: NOT DETECTED
Parainfluenza Virus 4: NOT DETECTED
RHINOVIRUS / ENTEROVIRUS - RVPPCR: NOT DETECTED
Respiratory Syncytial Virus: NOT DETECTED

## 2017-12-16 LAB — CBC
HEMATOCRIT: 32.5 % — AB (ref 36.0–46.0)
Hemoglobin: 10.4 g/dL — ABNORMAL LOW (ref 12.0–15.0)
MCH: 28.3 pg (ref 26.0–34.0)
MCHC: 32 g/dL (ref 30.0–36.0)
MCV: 88.6 fL (ref 78.0–100.0)
Platelets: 124 10*3/uL — ABNORMAL LOW (ref 150–400)
RBC: 3.67 MIL/uL — ABNORMAL LOW (ref 3.87–5.11)
RDW: 13.6 % (ref 11.5–15.5)
WBC: 10.2 10*3/uL (ref 4.0–10.5)

## 2017-12-16 LAB — BASIC METABOLIC PANEL
Anion gap: 8 (ref 5–15)
BUN: 7 mg/dL — AB (ref 8–23)
CHLORIDE: 105 mmol/L (ref 98–111)
CO2: 25 mmol/L (ref 22–32)
Calcium: 8.4 mg/dL — ABNORMAL LOW (ref 8.9–10.3)
Creatinine, Ser: 0.66 mg/dL (ref 0.44–1.00)
GFR calc Af Amer: >60 mL/min (ref >60)
GFR calc non Af Amer: >60 mL/min (ref >60)
Glucose, Bld: 98 mg/dL (ref 70–99)
Potassium: 4.1 mmol/L (ref 3.5–5.1)
Sodium: 138 mmol/L (ref 135–145)

## 2017-12-16 LAB — MRSA PCR SCREENING: MRSA BY PCR: NEGATIVE

## 2017-12-16 LAB — SEDIMENTATION RATE: Sed Rate: 76 mm/hr — ABNORMAL HIGH (ref 0–22)

## 2017-12-16 LAB — PROCALCITONIN: Procalcitonin: <0.1 ng/mL

## 2017-12-16 LAB — VANCOMYCIN, TROUGH: Vancomycin Tr: 15 ug/mL (ref 15–20)

## 2017-12-16 LAB — BRAIN NATRIURETIC PEPTIDE: B NATRIURETIC PEPTIDE 5: 77.7 pg/mL (ref 0.0–100.0)

## 2017-12-16 MED ORDER — ACETAMINOPHEN 325 MG PO TABS: 650.0000 mg | ORAL_TABLET | Freq: Four times a day (QID) | ORAL | Status: DC | PRN

## 2017-12-16 MED ORDER — VANCOMYCIN HCL IN DEXTROSE 1-5 GM/200ML-% IV SOLN
1000.0000 mg | Freq: Two times a day (BID) | INTRAVENOUS | Status: DC
  Administered 2017-12-16 – 2017-12-17 (×3): 1000 mg via INTRAVENOUS
  Filled 2017-12-16 (×4): qty 200

## 2017-12-16 MED ORDER — ONDANSETRON HCL 4 MG PO TABS: 4.0000 mg | ORAL_TABLET | Freq: Four times a day (QID) | ORAL | Status: DC | PRN

## 2017-12-16 MED ORDER — PANTOPRAZOLE SODIUM 40 MG PO TBEC
40.0000 mg | DELAYED_RELEASE_TABLET | Freq: Every day | ORAL | Status: DC
  Administered 2017-12-16 – 2017-12-18 (×3): 40 mg via ORAL
  Filled 2017-12-16 (×4): qty 1

## 2017-12-16 MED ORDER — ACETAMINOPHEN 650 MG RE SUPP: 650.0000 mg | Freq: Four times a day (QID) | RECTAL | Status: DC | PRN

## 2017-12-16 MED ORDER — ALBUTEROL SULFATE (2.5 MG/3ML) 0.083% IN NEBU: 2.5000 mg | INHALATION_SOLUTION | RESPIRATORY_TRACT | Status: DC | PRN

## 2017-12-16 MED ORDER — SODIUM CHLORIDE 0.9 % IV SOLN
1.0000 g | Freq: Three times a day (TID) | INTRAVENOUS | Status: DC
  Administered 2017-12-16 – 2017-12-18 (×5): 1 g via INTRAVENOUS
  Filled 2017-12-16 (×6): qty 1

## 2017-12-16 MED ORDER — TRAZODONE HCL 50 MG PO TABS: 25.0000 mg | ORAL_TABLET | Freq: Every evening | ORAL | Status: DC | PRN

## 2017-12-16 MED ORDER — ONDANSETRON HCL 4 MG/2ML IJ SOLN: 4.0000 mg | Freq: Four times a day (QID) | INTRAMUSCULAR | Status: DC | PRN

## 2017-12-16 MED ORDER — BISACODYL 5 MG PO TBEC
5.0000 mg | DELAYED_RELEASE_TABLET | Freq: Every day | ORAL | Status: DC | PRN
  Administered 2017-12-23: 5 mg via ORAL
  Filled 2017-12-16: qty 1

## 2017-12-16 MED ORDER — LISINOPRIL 20 MG PO TABS
20.0000 mg | ORAL_TABLET | Freq: Every day | ORAL | Status: DC
  Administered 2017-12-16 – 2017-12-17 (×2): 20 mg via ORAL
  Filled 2017-12-16 (×2): qty 1

## 2017-12-16 MED ORDER — GUAIFENESIN-DM 100-10 MG/5ML PO SYRP
5.0000 mL | ORAL_SOLUTION | ORAL | Status: DC | PRN
  Administered 2017-12-16 – 2017-12-19 (×7): 5 mL via ORAL
  Filled 2017-12-16 (×7): qty 5

## 2017-12-16 MED ORDER — ALBUTEROL SULFATE (2.5 MG/3ML) 0.083% IN NEBU
2.5000 mg | INHALATION_SOLUTION | Freq: Four times a day (QID) | RESPIRATORY_TRACT | Status: DC
  Administered 2017-12-16: 2.5 mg via RESPIRATORY_TRACT
  Filled 2017-12-16: qty 3

## 2017-12-16 MED ORDER — SODIUM CHLORIDE 0.9 % IV SOLN
INTRAVENOUS | Status: AC
  Administered 2017-12-16: 16:00:00 via INTRAVENOUS

## 2017-12-16 NOTE — Progress Notes (Signed)
ANTIBIOTIC CONSULT NOTE   Pharmacy Consult for Vanco Indication: pneumonia  Allergies  Allergen Reactions  . Homatropine     Pt doesn't remember  . Hydromet [Hydrocodone-Homatropine]     Pt doesn't remember  . Amoxicillin Rash    Patient Measurements: Height: 5\' 1"  (154.9 cm) Weight: 152 lb 1.9 oz (69 kg) IBW/kg (Calculated) : 47.8 Adjusted Body Weight:    Vital Signs: Temp: 98.5 F (36.9 C) (08/27 1915) Temp Source: Oral (08/27 1915) BP: 144/71 (08/27 1915) Pulse Rate: 78 (08/27 1915) Intake/Output from previous day: No intake/output data recorded. Intake/Output from this shift: No intake/output data recorded.  Labs: Recent Labs    12/16/17 1629  WBC 10.2  HGB 10.4*  PLT 124*  CREATININE 0.66   Estimated Creatinine Clearance: 60.6 mL/min (by C-G formula based on SCr of 0.66 mg/dL). Recent Labs    12/16/17 1924  [place] 15     Microbiology: Recent Results (from the past 720 hour(s))  MRSA PCR Screening     Status: None   Collection Time: 12/16/17  1:58 PM  Result Value Ref Range Status   MRSA by PCR NEGATIVE NEGATIVE Final    Comment:        The GeneXpert MRSA Assay (FDA approved for NASAL specimens only), is one component of a comprehensive MRSA colonization surveillance program. It is not intended to diagnose MRSA infection nor to guide or monitor treatment for MRSA infections. Performed at [facility], [place] [address] [place], [place] [address]     Medical History: Past Medical History:  Diagnosis Date  . Acute respiratory failure ([place])   . CAP (community acquired pneumonia)   . Hyperlipidemia   . Hypertension   . Metabolic syndrome     Assessment:  ID: PNA/ Afebrile. Patient went through several abx at OSH but transferred on Vanco/Rocephin/Azithro. No Vancomycin level noted in outside hospital labs.  Fluconazole po 8/25> Vanco 8/24>> Rocephin 8/20>> Azithro 8/20>>  [place] Cultures: 8/18 Sputum: heavy  yeast 8/18: BC MRSE 8/18 BC x 2: negative  8/27: Vanco level (9 hrs post-dose, done early): 15  Goal of Therapy:  Vancomycin trough level 15-20 mcg/ml  Plan:  Increase Vancomycin to 1g IV q 12 hrs Cefepime 1g IV q 8 hrs.  Dellamae Rosamilia S. Alford Highland, PharmD, BCPS Clinical Staff Pharmacist Pager [phone]  Eilene Ghazi Stillinger 12/16/2017,9:30 PM
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
## 2017-12-16 NOTE — Progress Notes (Addendum)
ANTIBIOTIC CONSULT NOTE - INITIAL  Pharmacy Consult for Vanco/Cefepime Indication: pneumonia  Allergies  Allergen Reactions  . Amoxicillin   . Homatropine   . Hydromet [Hydrocodone-Homatropine]     Patient Measurements: Height: 5\' 1"  (154.9 cm) Weight: 152 lb 1.9 oz (69 kg) IBW/kg (Calculated) : 47.8 Adjusted Body Weight:    Vital Signs: Temp: 98.3 F (36.8 C) (08/27 1430) Temp Source: Oral (08/27 1430) BP: 137/67 (08/27 1430) Pulse Rate: 78 (08/27 1430) Intake/Output from previous day: No intake/output data recorded. Intake/Output from this shift: Total I/O In: 240 [P.O.:240] Out: -   Labs: No results for input(s): WBC, HGB, PLT, LABCREA, CREATININE in the last 72 hours. CrCl cannot be calculated (Patient's most recent lab result is older than the maximum 21 days allowed.). No results for input(s): VANCOTROUGH, VANCOPEAK, VANCORANDOM, GENTTROUGH, GENTPEAK, GENTRANDOM, TOBRATROUGH, TOBRAPEAK, TOBRARND, AMIKACINPEAK, AMIKACINTROU, AMIKACIN in the last 72 hours.   Microbiology: No results found for this or any previous visit (from the past 720 hour(s)).  Medical History: Past Medical History:  Diagnosis Date  . Acute respiratory failure ([place])   . CAP (community acquired pneumonia)   . Hyperlipidemia   . Hypertension   . Metabolic syndrome     Assessment: CC/HPI: Transferred from [facility] in [place] with PNA for bronch. -Labs 826: WBC 10.7, H/H 10.4/32.1, Plts 129, Scr 0.7  PMH: HLD, GERD,  ID: PNA/ Afebrile. Patient went through several abx at OSH but transferred on Vanco/Rocephin/Azithro. No Vancomycin level noted in outside hospital labs.  Fluconazole po 8/25> Vanco 8/24>> Rocephin 8/20>> Azithro 8/20>>  [place] Cultures: 8/18 Sputum: heavy yeast 8/18: BC MRSE 8/18 BC x 2: negative  Goal of Therapy:  Vancomycin trough level 15-20 mcg/ml  Plan:  Vancomycin 750mg  IV q 12h (LD 8/27 around 1015). No levels noted. Level this PM prior to  redosing. Cefepime 1g IV q 8 hrs  Jamorris Ndiaye S. Alford [place], PharmD, BCPS Clinical Staff Pharmacist Pager [phone]  Eilene Ghazi Stillinger 12/16/2017,4:08 PM

## 2017-12-16 NOTE — Plan of Care (Signed)
Pt arrived to 2W 23

## 2017-12-17 ENCOUNTER — Ambulatory Visit (HOSPITAL_COMMUNITY): Payer: Medicare Other

## 2017-12-17 ENCOUNTER — Inpatient Hospital Stay (HOSPITAL_COMMUNITY): Payer: Medicare Other

## 2017-12-17 ENCOUNTER — Ambulatory Visit: Payer: Medicare Other | Admitting: Family Medicine

## 2017-12-17 DIAGNOSIS — E785 Hyperlipidemia, unspecified: Secondary | ICD-10-CM

## 2017-12-17 DIAGNOSIS — J9601 Acute respiratory failure with hypoxia: Secondary | ICD-10-CM

## 2017-12-17 LAB — COMPREHENSIVE METABOLIC PANEL
ALK PHOS: 43 U/L (ref 38–126)
ALT: 17 U/L (ref 0–44)
ANION GAP: 7 (ref 5–15)
AST: 30 U/L (ref 15–41)
Albumin: 2.1 g/dL — ABNORMAL LOW (ref 3.5–5.0)
BILIRUBIN TOTAL: 1.3 mg/dL — AB (ref 0.3–1.2)
BUN: 7 mg/dL — ABNORMAL LOW (ref 8–23)
CALCIUM: 8.1 mg/dL — AB (ref 8.9–10.3)
CO2: 24 mmol/L (ref 22–32)
Chloride: 107 mmol/L (ref 98–111)
Creatinine, Ser: 0.71 mg/dL (ref 0.44–1.00)
Glucose, Bld: 103 mg/dL — ABNORMAL HIGH (ref 70–99)
POTASSIUM: 4.6 mmol/L (ref 3.5–5.1)
Sodium: 138 mmol/L (ref 135–145)
TOTAL PROTEIN: 4.8 g/dL — AB (ref 6.5–8.1)

## 2017-12-17 LAB — EXPECTORATED SPUTUM ASSESSMENT W GRAM STAIN, RFLX TO RESP C

## 2017-12-17 LAB — C-REACTIVE PROTEIN: CRP: 6.9 mg/dL — ABNORMAL HIGH (ref <1.0)

## 2017-12-17 LAB — CBC
HEMATOCRIT: 29.4 % — AB (ref 36.0–46.0)
HEMOGLOBIN: 9.2 g/dL — AB (ref 12.0–15.0)
MCH: 28 pg (ref 26.0–34.0)
MCHC: 31.3 g/dL (ref 30.0–36.0)
MCV: 89.4 fL (ref 78.0–100.0)
Platelets: 113 10*3/uL — ABNORMAL LOW (ref 150–400)
RBC: 3.29 MIL/uL — AB (ref 3.87–5.11)
RDW: 13.7 % (ref 11.5–15.5)
WBC: 7.6 10*3/uL (ref 4.0–10.5)

## 2017-12-17 LAB — HIV ANTIBODY (ROUTINE TESTING W REFLEX): HIV Screen 4th Generation wRfx: NONREACTIVE

## 2017-12-17 LAB — SEDIMENTATION RATE: Sed Rate: 69 mm/hr — ABNORMAL HIGH (ref 0–22)

## 2017-12-17 LAB — STREP PNEUMONIAE URINARY ANTIGEN: Strep Pneumo Urinary Antigen: NEGATIVE

## 2017-12-17 MED ORDER — ALBUTEROL SULFATE (2.5 MG/3ML) 0.083% IN NEBU
2.5000 mg | INHALATION_SOLUTION | Freq: Three times a day (TID) | RESPIRATORY_TRACT | Status: DC
  Administered 2017-12-17 – 2017-12-18 (×4): 2.5 mg via RESPIRATORY_TRACT
  Filled 2017-12-17 (×4): qty 3

## 2017-12-17 NOTE — Progress Notes (Signed)
RT called to patient's room due to desat following a coughing spell.  Pt's sat 86% on 4 L.  Sat improved to 90% on 6 L. Due to nasal conngestion, RT placed pt on venturi mask 6L, 35% and sat improved to 93%.

## 2017-12-17 NOTE — Progress Notes (Signed)
PROGRESS NOTE    Quida Glasser  TOI:[id number] DOB: 07/21/1950 DOA: 12/16/2017 PCP: Dettinger, Fransisca Kaufmann, MD  Outpatient Specialists:   Brief Narrative: Kleo Dungee is a 67 y.o. female with a Past Medical History of HTN and HLD who was admitted to [facility] for acute respiratory failure thought to be due to CAP.  She has not been improving.  She was on NRB through 2 days ago and was transitioned to 10L  O2 yesterday.  Due to her slow improvement, transfer was sought for pulmonary consultation.    Patient remains afebrile.  No leukocytosis.  Procalcitonin is less than 0.10.  Apparently, patient does not have constitutional symptoms that would be suggestive of pneumonia.  High resolution CT chest is pending.  HIV is nonrevealing.  We will also check ANA, ESR, CRP and rheumatoid factor.    Assessment & Plan:   Principal Problem:   Acute respiratory failure (HCC) Active Problems:   Hypertension   Hyperlipidemia with target LDL less than 100   Allergy-induced asthma   BMI 25.0-25.9,adult   CAP (community acquired pneumonia)   Acute respiratory failure: -Continue oxygen supplementation -Monitor oxygen saturation level -Continue antibiotics I.e. Vanc, zosyn  -Nebulizers -procalcitonin -await recommendations from pulmonology 12/17/2017: Pulmonary input is appreciated.  I agree with high resolution CT scan of the chest.  Will check ANA, ESR, CRP and rheumatoid factor.  I have a low threshold to introduce steroids.  Patient does not have constitutional symptoms of pneumonia.  There is no fever or chills, no leukocytosis and the procalcitonin is less than 0.1.  Community-acquired pneumonia.  See above.   -Sputum culture as able -Pulmonary consult -Pro calcitonin 12/17/2017: I doubt pneumonia.  Hypertension: Home medications include lisinopril hydrochlorothiazide. 12/17/2017: Continue to optimize.  Hyperlipidemia: -Continue statin   DVT prophylaxis: scd  Code Status: full  Family  Communication: daughter at bedside  Disposition Plan: home  Consults called: pulmonology   Procedures:   None  Antimicrobials:   Vancomycin and cefepime   Subjective: Patient remains in respiratory failure.  Objective: Vitals:   12/17/17 0923 12/17/17 1300 12/17/17 1314 12/17/17 1418  BP: (!) 122/57  126/63 126/63  Pulse: 97 78 78 89  Resp: (!) 26 (!) 35 (!) 31 (!) 38  Temp:      TempSrc:      SpO2: 93% 99% 99% 98%  Weight:      Height:        Intake/Output Summary (Last 24 hours) at 12/17/2017 1837 Last data filed at 12/17/2017 0900 Gross per 24 hour  Intake 1064.6 ml  Output -  Net 1064.6 ml   Filed Weights   12/16/17 1300 12/16/17 1430 12/17/17 0500  Weight: 69 kg 69 kg 70.6 kg    Examination:  General exam: Appears calm and comfortable, but remains on significant amount of supplemental oxygen.Marland Kitchen Respiratory system: Good air entry anteriorly Cardiovascular system: S1 & S2.  Gastrointestinal system: Abdomen is nondistended, soft and nontender. No organomegaly or masses felt. Normal bowel sounds heard. Central nervous system: Alert and oriented. No focal neurological deficits. Extremities: No leg edema.  Data Reviewed: I have personally reviewed following labs and imaging studies  CBC: Recent Labs  Lab 12/16/17 1629 12/17/17 0324  WBC 10.2 7.6  HGB 10.4* 9.2*  HCT 32.5* 29.4*  MCV 88.6 89.4  PLT 124* 833*   Basic Metabolic Panel: Recent Labs  Lab 12/16/17 1629 12/17/17 0324  NA 138 138  K 4.1 4.6  CL 105 107  CO2 25 24  GLUCOSE 98 103*  BUN 7* 7*  CREATININE 0.66 0.71  CALCIUM 8.4* 8.1*   GFR: Estimated Creatinine Clearance: 61.3 mL/min (by C-G formula based on SCr of 0.71 mg/dL). Liver Function Tests: Recent Labs  Lab 12/17/17 0324  AST 30  ALT 17  ALKPHOS 43  BILITOT 1.3*  PROT 4.8*  ALBUMIN 2.1*   No results for input(s): LIPASE, AMYLASE in the last 168 hours. No results for input(s): AMMONIA in the last 168  hours. Coagulation Profile: No results for input(s): INR, PROTIME in the last 168 hours. Cardiac Enzymes: No results for input(s): CKTOTAL, CKMB, CKMBINDEX, TROPONINI in the last 168 hours. BNP (last 3 results) No results for input(s): PROBNP in the last 8760 hours. HbA1C: No results for input(s): HGBA1C in the last 72 hours. CBG: No results for input(s): GLUCAP in the last 168 hours. Lipid Profile: No results for input(s): CHOL, HDL, LDLCALC, TRIG, CHOLHDL, LDLDIRECT in the last 72 hours. Thyroid Function Tests: No results for input(s): TSH, T4TOTAL, FREET4, T3FREE, THYROIDAB in the last 72 hours. Anemia Panel: No results for input(s): VITAMINB12, FOLATE, FERRITIN, TIBC, IRON, RETICCTPCT in the last 72 hours. Urine analysis:    Component Value Date/Time   BILIRUBINUR neg 05/04/2013 1428   PROTEINUR neg 05/04/2013 1428   UROBILINOGEN negative 05/04/2013 1428   NITRITE neg 05/04/2013 1428   LEUKOCYTESUR large (3+) 05/04/2013 1428   Sepsis Labs: _0 (procalcitonin:4,lacticidven:4)  ) Recent Results (from the past 240 hour(s))  MRSA PCR Screening     Status: None   Collection Time: 12/16/17  1:58 PM  Result Value Ref Range Status   MRSA by PCR NEGATIVE NEGATIVE Final    Comment:        The GeneXpert MRSA Assay (FDA approved for NASAL specimens only), is one component of a comprehensive MRSA colonization surveillance program. It is not intended to diagnose MRSA infection nor to guide or monitor treatment for MRSA infections. Performed at [facility], [place] [address], [place], [place] [address]   Expectorated sputum assessment w rflx to resp cult     Status: None   Collection Time: 12/16/17  6:30 PM  Result Value Ref Range Status   Specimen Description EXPECTORATED SPUTUM  Final   Special Requests NONE  Final   Sputum evaluation   Final    Sputum specimen not acceptable for testing.  Please recollect.   RESULT CALLED TO, READ BACK BY AND VERIFIED WITH: Filbert Berthold RN [phone] 12/17/17 A BROWNING Performed at [facility], [place] [address], [place], [place] [address]    Report Status 12/17/2017 FINAL  Final  Respiratory Panel by PCR     Status: None   Collection Time: 12/16/17  8:04 PM  Result Value Ref Range Status   Adenovirus NOT DETECTED NOT DETECTED Final   Coronavirus 229E NOT DETECTED NOT DETECTED Final   Coronavirus HKU1 NOT DETECTED NOT DETECTED Final   Coronavirus NL63 NOT DETECTED NOT DETECTED Final   Coronavirus OC43 NOT DETECTED NOT DETECTED Final   Metapneumovirus NOT DETECTED NOT DETECTED Final   Rhinovirus / Enterovirus NOT DETECTED NOT DETECTED Final   Influenza A NOT DETECTED NOT DETECTED Final   Influenza B NOT DETECTED NOT DETECTED Final   Parainfluenza Virus 1 NOT DETECTED NOT DETECTED Final   Parainfluenza Virus 2 NOT DETECTED NOT DETECTED Final   Parainfluenza Virus 3 NOT DETECTED NOT DETECTED Final   Parainfluenza Virus 4 NOT DETECTED NOT DETECTED Final   Respiratory Syncytial Virus NOT  DETECTED NOT DETECTED Final   Bordetella pertussis NOT DETECTED NOT DETECTED Final   Chlamydophila pneumoniae NOT DETECTED NOT DETECTED Final   Mycoplasma pneumoniae NOT DETECTED NOT DETECTED Final    Comment: Performed at [facility], [place] [address], [place], [place] [address]         Radiology Studies: No results found.      Scheduled Meds: . albuterol  2.5 mg Nebulization TID  . lisinopril  20 mg Oral Daily  . pantoprazole  40 mg Oral Daily   Continuous Infusions: . sodium chloride 50 mL/hr at 12/17/17 0400  . ceFEPime (MAXIPIME) IV 1 g (12/17/17 1823)  . vancomycin 1,000 mg (12/17/17 1151)     LOS: 1 day    Time spent: 35 minutes    Dana Allan, MD  [facility] Pager #: [phone] 7PM-7AM contact night coverage as above

## 2017-12-17 NOTE — Progress Notes (Addendum)
Patient complained of difficulty breathing.  Respiratory at bedside.  PRN Robitussin DM given, patient appears nasally congested.  Respiratory will try a Venturi mask.   This note also relates to the following rows which could not be included: SpO2 - 87% up to 92%.

## 2017-12-18 ENCOUNTER — Inpatient Hospital Stay (HOSPITAL_COMMUNITY): Payer: Medicare Other

## 2017-12-18 DIAGNOSIS — R768 Other specified abnormal immunological findings in serum: Secondary | ICD-10-CM

## 2017-12-18 DIAGNOSIS — J8489 Other specified interstitial pulmonary diseases: Secondary | ICD-10-CM

## 2017-12-18 DIAGNOSIS — I5030 Unspecified diastolic (congestive) heart failure: Secondary | ICD-10-CM

## 2017-12-18 LAB — MPO/PR-3 (ANCA) ANTIBODIES

## 2017-12-18 LAB — ECHOCARDIOGRAM COMPLETE
Height: 61 in
WEIGHTICAEL: 2490.32 [oz_av]

## 2017-12-18 LAB — BLOOD GAS, ARTERIAL
Acid-Base Excess: 0.1 mmol/L (ref 0.0–2.0)
Bicarbonate: 23.9 mmol/L (ref 20.0–28.0)
Drawn by: 502221
O2 Content: 5 L/min
O2 Saturation: 92 %
Patient temperature: 98.6
pCO2 arterial: 36.8 mmHg (ref 32.0–48.0)
pH, Arterial: 7.429 (ref 7.350–7.450)
pO2, Arterial: 61.6 mmHg — ABNORMAL LOW (ref 83.0–108.0)

## 2017-12-18 LAB — CYCLIC CITRUL PEPTIDE ANTIBODY, IGG/IGA: CCP Antibodies IgG/IgA: 4 units (ref 0–19)

## 2017-12-18 LAB — ANTINUCLEAR ANTIBODIES, IFA
ANA Ab, IFA: POSITIVE — AB
ANTINUCLEAR ANTIBODIES, IFA: POSITIVE — AB

## 2017-12-18 LAB — RHEUMATOID FACTOR: Rhuematoid fact SerPl-aCnc: 10.6 IU/mL (ref 0.0–13.9)

## 2017-12-18 LAB — FANA STAINING PATTERNS
Nuclear Dot Pattern: 1:640 {titer}
Speckled Pattern: 1:320 {titer} — ABNORMAL HIGH

## 2017-12-18 LAB — ANGIOTENSIN CONVERTING ENZYME: Angiotensin-Converting Enzyme: <15 U/L (ref 14–82)

## 2017-12-18 LAB — LEGIONELLA PNEUMOPHILA SEROGP 1 UR AG: L. PNEUMOPHILA SEROGP 1 UR AG: NEGATIVE

## 2017-12-18 LAB — ANTI-DNA ANTIBODY, DOUBLE-STRANDED: ds DNA Ab: <1 IU/mL (ref 0–9)

## 2017-12-18 LAB — SJOGRENS SYNDROME-B EXTRACTABLE NUCLEAR ANTIBODY

## 2017-12-18 LAB — ANTI-SCLERODERMA ANTIBODY: Scleroderma (Scl-70) (ENA) Antibody, IgG: <0.2 AI (ref 0.0–0.9)

## 2017-12-18 LAB — SJOGRENS SYNDROME-A EXTRACTABLE NUCLEAR ANTIBODY: SSA (Ro) (ENA) Antibody, IgG: <0.2 AI (ref 0.0–0.9)

## 2017-12-18 MED ORDER — DIPHENHYDRAMINE HCL 50 MG/ML IJ SOLN
25.0000 mg | Freq: Once | INTRAMUSCULAR | Status: AC
  Administered 2017-12-18: 25 mg via INTRAVENOUS

## 2017-12-18 MED ORDER — DIPHENHYDRAMINE HCL 50 MG/ML IJ SOLN
INTRAMUSCULAR | Status: AC
  Administered 2017-12-18: 25 mg via INTRAVENOUS
  Filled 2017-12-18: qty 1

## 2017-12-18 MED ORDER — FUROSEMIDE 10 MG/ML IJ SOLN
60.0000 mg | Freq: Once | INTRAMUSCULAR | Status: AC
  Administered 2017-12-18: 60 mg via INTRAVENOUS
  Filled 2017-12-18: qty 6

## 2017-12-18 MED ORDER — HYDRALAZINE HCL 20 MG/ML IJ SOLN
10.0000 mg | Freq: Four times a day (QID) | INTRAMUSCULAR | Status: DC | PRN
  Administered 2017-12-18: 10 mg via INTRAVENOUS
  Filled 2017-12-18: qty 1

## 2017-12-18 MED ORDER — IPRATROPIUM-ALBUTEROL 0.5-2.5 (3) MG/3ML IN SOLN: 3.0000 mL | RESPIRATORY_TRACT | Status: DC | PRN

## 2017-12-18 MED ORDER — SODIUM CHLORIDE 0.9 % IV SOLN
250.0000 mg | Freq: Four times a day (QID) | INTRAVENOUS | Status: DC
  Administered 2017-12-18 – 2017-12-21 (×12): 250 mg via INTRAVENOUS
  Filled 2017-12-18 (×19): qty 2

## 2017-12-18 NOTE — Progress Notes (Addendum)
Tasha [name]  MRN: [id number]  DOB: February 04, 1951  DOA: 12/16/2017 Date of Consult: 892?11  LOS 2 days  PCP: Dettinger, Fransisca Kaufmann, MD   Reason for Consult / Chief Complaint:  Acute on chroinic resp failure  Consulting MD:  [facility]  HPI/Brief Narrative   67 year old Hispanic female with PMH of non-smoker, HTN, HLD, and GERD who was transferred from [facility] to [place], admitted by [facility] for failed outpatient treatment of pneumonia.   Patient is from home and lives with her husband.  She does not work.  Has 2 pet dogs.  No exposure to birds or known mold.  Denies recent travel, sick exposures, weight loss, hemoptysis, fever (except for at [facility] x 2), vomiting, no family hx of lung diseases or autoimmune issues, joint pain or swelling, weight loss, leg swelling.   Family states mid July they found out that her car was leaking antifreeze and burning in which she had been inhaling.  Patient then began to get sick which has slowly progressed over the last 1.5 months.  Prior to, she was independent in her ADLs without limitations or issues.    Patient seen at PCP on 8/12 for non-productive cough and wheezing, afebrile, treated with doxycycline, prednisone taper, and albuterol after CXR showed bibasilar patchy consolidation.  She returned on 8/16 after failing to improve with continued fatigue, dry cough, and shortness of breath; no fevers or hemoptysis.  Patient does not recall if she noticed an improvement with prednisone taper She was switched to Levaquin, however failed to improve and therefore admitted to [facility] on 8/18 for presumed CAP.  There, she had negative CTA chest for PE, found to have elevated BNP 1430 and diuresed with lasix.  TTE findings listed below, but normal except for findings c/w mild pulmonary hypertension, 35-40 mmHg.  She was treated with Levaquin, Rocephin, zithromax, vancomcyin, and then placed on Fluconazole after sputum cx showed yeast.  She has had high O2 requirements,  requiring NRB two days ago, transitioned to HFNC and now on  and remains easily dyspneic with dry cough.  PCCM consulted for further pulmonary recommendations  EVENTS Results for Tasha, [name] (MRN [id number]) as of 12/18/2017 07:56  Ref. Range 12/16/2017 19:24 12/17/2017 18:59  Angiotensin-Converting Enzyme Latest Ref Range: 14 - 82 U/L < 15   RA Latex Turbid. Latest Ref Range: 0.0 - 13.9 IU/mL  10.6  Results for Tasha, [name] (MRN [id number]) as of 12/18/2017 07:56  Ref. Range 12/16/2017 19:24 12/17/2017 18:59  Sed Rate Latest Ref Range: 0 - 22 mm/hr 76 (H) 69 (H)  Results for Tasha, [name] (MRN [id number]) as of 12/18/2017 07:56  Ref. Range 12/16/2017 16:29  Procalcitonin Latest Units: ng/mL <0.10    ID workup since admit - - RVP negative, HIv negaive  Subjective  12/18/2017 - HRCT done - visualized. Formal report pending. Shows bilateral LL consolidation in aspiration pattern. Remains afebrile. ID workup negative incuding fevre and wbc count, RVP, urine strep negative. Now at 608L o2 at rest and pulse ox 92%.  Daughter at bedside says - needs to be at 45 degrees, unale to lie flat and transfer from bed to bath results in couth and desturations to 70s  Objective    Vitals:   12/18/17 0300 12/18/17 0400 12/18/17 0721 12/18/17 0750  BP:  (!) 119/54 (!) 104/42   Pulse: 95  65   Resp: (!) 30  (!) 26   Temp: 98 F (36.7 C)  98.9 F (37.2 C)  TempSrc: Oral  Oral   SpO2: 97%  97% 97%  Weight:      Height:        Filed Weights   12/16/17 1300 12/16/17 1430 12/17/17 0500  Weight: 69 kg 69 kg 70.6 kg     FiO2 (%):  [35 %-40 %] 40 %    General Appearance:    Calm, no distress, in bed 45 degree  Head:    Normocephalic, without obvious abnormality, atraumatic  Eyes:    PERRL - yes, conjunctiva/corneas - clear      Ears:    Normal external ear canals, both ears  Nose:   NG tube - no but has [place] o2 at 6-8L  Throat:  ETT TUBE - no , OG tube - no  Neck:   Supple,  No  enlargement/tenderness/nodules     Lungs:     Mild tachypnea, no paradoxical motion, Bilateral LL crackles ++  Chest wall:    No deformity  Heart:    S1 and S2 normal, no murmur, CVP - no.  Pressors - no  Abdomen:     Soft, no masses, no organomegaly  Genitalia:    Not done  Rectal:   not done  Extremities:   Extremities- intact, euvolemic     Skin:   Intact in exposed areas .      Neurologic:   Sedation - none -> RASS - + . Moves all 4s - yes. CAM-ICU - neg . Orientation - x3+       Labs   PULMONARY No results for input(s): PHART, PCO2ART, PO2ART, HCO3, TCO2, O2SAT in the last 168 hours.  Invalid input(s): PCO2, PO2  CBC Recent Labs  Lab 12/16/17 1629 12/17/17 0324  HGB 10.4* 9.2*  HCT 32.5* 29.4*  WBC 10.2 7.6  PLT 124* 113*    COAGULATION No results for input(s): INR in the last 168 hours.  CARDIAC  No results for input(s): TROPONINI in the last 168 hours. No results for input(s): PROBNP in the last 168 hours.   CHEMISTRY Recent Labs  Lab 12/16/17 1629 12/17/17 0324  NA 138 138  K 4.1 4.6  CL 105 107  CO2 25 24  GLUCOSE 98 103*  BUN 7* 7*  CREATININE 0.66 0.71  CALCIUM 8.4* 8.1*   Estimated Creatinine Clearance: 61.3 mL/min (by C-G formula based on SCr of 0.71 mg/dL).   LIVER Recent Labs  Lab 12/17/17 0324  AST 30  ALT 17  ALKPHOS 43  BILITOT 1.3*  PROT 4.8*  ALBUMIN 2.1*     INFECTIOUS Recent Labs  Lab 12/16/17 1629  PROCALCITON <0.10     ENDOCRINE CBG (last 3)  No results for input(s): GLUCAP in the last 72 hours.       IMAGING x48h  - image(s) personally visualized  -   highlighted in bold No results found.  HRCT - bialteral LL consoloidation in my personal visualization opinion. Official report pending     Assessment & Plan:  Acute on chronic hypoxemic resp failure Subacute ILD - Bilateral Consolidation, Non-UIP patter  - High ESR  - Negative infectious workup  - negative prelim autoimmune workup  Likely  BOOP/COP - etiology from anti-freeze or ? Chronic aspiration  PLAN - too high risk for bronch BAL or Surgical lung biopsy  - dc antibiotics - start presumptive BOOP  IV steroids x 48-72h and then po steroid course for few to several months - dc lisinopril - makes cough worse - [facility] to  introduce altnerative to ACE/ARB = check Quantiferon gold and G6PD in case of immunesuppression needs in future - check speech swallow eval   Best Practice / Goals of Care / Disposition.   Family Communication: daughter main communication due to language barrier  Disposition / Summary of Today's Plan 12/16/17   Med surg tele with [facility] to continue    ATTESTATION & SIGNATURE      Dr. Brand Males, M.D., [facility].C.P Pulmonary and Critical Care Medicine Staff Physician [place] [facility] Pager: [phone], If no answer or between  15:00h - 7:00h: call [phone]  12/18/2017 8:08 AM  LOS 2 days

## 2017-12-18 NOTE — Significant Event (Addendum)
Rapid Response Event Note  Overview: Respiratory Therapist Zenia Resides called concerning worsening respiratory failure  Initial Focused Assessment: Upon arrival, Tasha [name] is drowsy, sitting upright at 45 degrees in the bed.  Her daughter is at bedside and is translating due to the language barrier.  Tasha [name] is very tachypneic (RR 45-50) with sats 86% on 11L HFNC.  HR 115 ST, BP 131/56, Temp 98.0.  BBS clear anteriorly with small amounts of air movement, bibasilar crackles.  Moderate distress and a dry cough.  ABG was ordered earlier and obtained.  BIPAP is being fitted on the patient now.   Interventions: -BIPAP -Stat PCXR -Duoneb -Lasix 60 mg IV now -Foley  Plan of Care (if not transferred): -Will continue to monitor closely -Assess response to Lasix -Wean FIO2 for Sats >92% -High risk for intubation/transfer to ICU  Discussed POC with C. Bodenheimer APP   Addendum: 0100- Pt nodded yes when her daughter asked her if her breathing was better.  HR 90s SR, RR 40s with sats in low 90s on Bipap 14/6 on 100% FiO2.  Madelynn Done

## 2017-12-18 NOTE — Progress Notes (Signed)
  Echocardiogram 2D Echocardiogram has been performed.  Nathon Stefanski L Androw 12/18/2017, 2:36 PM

## 2017-12-18 NOTE — Progress Notes (Signed)
PROGRESS NOTE    Tasha [name]  GUY:[id number] DOB: 1950-08-09 DOA: 12/16/2017 PCP: Dettinger, Fransisca Kaufmann, MD  Outpatient Specialists:   Brief Narrative: Tasha [name] a 67 y.o. female with a Past Medical History of HTN and HLD who was admitted to [facility] for acute respiratory failure thought to be due to CAP.  She has not been improving.  She was on NRB through 2 days ago and was transitioned to 10L [place] O2 yesterday.  Due to her slow improvement, transfer was sought for pulmonary consultation.    Patient remains afebrile.  No leukocytosis.  Procalcitonin [name] less than 0.10.  Apparently, patient does not have constitutional symptoms that would be suggestive of pneumonia.  High resolution CT chest [name] pending.  HIV [name] nonrevealing.  We will also check ANA, ESR, CRP and rheumatoid factor.  12/18/2017: Pulmonary input [name] appreciated.  CRP and ESR are elevated.  Dermatophytosis negative.  However, ANA [name] positive (1:320), and speckled.  We will check anti-double-stranded DNA.  For now, the pulmonary team [name] assuming that this could be tentatively bulb/call BOOP/COP.  Patient has been started on high-dose steroids.  Patient [name] currently on 4 L of supplemental oxygen.  High resolution CT chest [name] done, but formal interpretation [name] still pending.  No fever or chills.  Assessment & Plan:   Principal Problem:   Acute respiratory failure (HCC) Active Problems:   Hypertension   Hyperlipidemia with target LDL less than 100   Allergy-induced asthma   BMI 25.0-25.9,adult   CAP (community acquired pneumonia)   BOOP (bronchiolitis obliterans with organizing pneumonia) ([place])   Acute respiratory failure: -Continue oxygen supplementation -Monitor oxygen saturation level -Continue antibiotics I.e. Vanc, zosyn  -Nebulizers -procalcitonin -await recommendations from pulmonology 12/17/2017: Pulmonary input [name] appreciated.  I agree with high resolution CT scan of the chest.  Will check ANA, ESR, CRP and  rheumatoid factor.  I have a low threshold to introduce steroids.  Patient does not have constitutional symptoms of pneumonia.  There [name] no fever or chills, no leukocytosis and the procalcitonin [name] less than 0.1. 12/18/2017: Kindly see above.  Community-acquired pneumonia.  See above.   -Sputum culture as able -Pulmonary consult -Pro calcitonin 12/18/2017: I doubt pneumonia.  Pulmonary team thinks this [name] possible Boop/cop, and patient [name] not on high-dose IV Solu-Medrol.  Positive ANA (speckled pattern)/elevated CRP and ESR: -Check anti-double-stranded DNA.  Hypertension: Home medications include lisinopril hydrochlorothiazide. 12/18/2017: Lisinopril discontinued.  Continue to monitor.  Optimize blood pressure.    Hyperlipidemia: -Continue statin   DVT prophylaxis: scd  Code Status: full  Family Communication: daughter at bedside  Disposition Plan: home  Consults called: pulmonology   Procedures:   None  Antimicrobials:   Vancomycin and cefepime discontinued.   Subjective: Patient looks better today.   Patient [name] on 4 L of supplemental oxygen via nasal cannula.    Objective: Vitals:   12/18/17 0750 12/18/17 0800 12/18/17 0900 12/18/17 1208  BP:  (!) 146/61  127/65  Pulse:  83 83 75  Resp:  18 14 (!) 37  Temp:    98.8 F (37.1 C)  TempSrc:    Oral  SpO2: 97% 98% 98% 98%  Weight:      Height:        Intake/Output Summary (Last 24 hours) at 12/18/2017 1652 Last data filed at 12/18/2017 0900 Gross per 24 hour  Intake 1169.3 ml  Output -  Net 1169.3 ml   Filed Weights   12/16/17 1300 12/16/17 1430 12/17/17  0500  Weight: 69 kg 69 kg 70.6 kg    Examination:  General exam: Appears calm and comfortable. Respiratory system: Decreased air entry posteriorly with coarse crackles lower lung lobes posteriorly. Cardiovascular system: S1 & S2.  Gastrointestinal system: Abdomen [name] nondistended, soft and nontender. No organomegaly or masses felt. Normal bowel sounds  heard. Central nervous system: Alert and oriented. No focal neurological deficits. Extremities: No leg edema.  Data Reviewed: I have personally reviewed following labs and imaging studies  CBC: Recent Labs  Lab 12/16/17 1629 12/17/17 0324  WBC 10.2 7.6  HGB 10.4* 9.2*  HCT 32.5* 29.4*  MCV 88.6 89.4  PLT 124* 423*   Basic Metabolic Panel: Recent Labs  Lab 12/16/17 1629 12/17/17 0324  NA 138 138  K 4.1 4.6  CL 105 107  CO2 25 24  GLUCOSE 98 103*  BUN 7* 7*  CREATININE 0.66 0.71  CALCIUM 8.4* 8.1*   GFR: Estimated Creatinine Clearance: 61.3 mL/min (by C-G formula based on SCr of 0.71 mg/dL). Liver Function Tests: Recent Labs  Lab 12/17/17 0324  AST 30  ALT 17  ALKPHOS 43  BILITOT 1.3*  PROT 4.8*  ALBUMIN 2.1*   No results for input(s): LIPASE, AMYLASE in the last 168 hours. No results for input(s): AMMONIA in the last 168 hours. Coagulation Profile: No results for input(s): INR, PROTIME in the last 168 hours. Cardiac Enzymes: No results for input(s): CKTOTAL, CKMB, CKMBINDEX, TROPONINI in the last 168 hours. BNP (last 3 results) No results for input(s): PROBNP in the last 8760 hours. HbA1C: No results for input(s): HGBA1C in the last 72 hours. CBG: No results for input(s): GLUCAP in the last 168 hours. Lipid Profile: No results for input(s): CHOL, HDL, LDLCALC, TRIG, CHOLHDL, LDLDIRECT in the last 72 hours. Thyroid Function Tests: No results for input(s): TSH, T4TOTAL, FREET4, T3FREE, THYROIDAB in the last 72 hours. Anemia Panel: No results for input(s): VITAMINB12, FOLATE, FERRITIN, TIBC, IRON, RETICCTPCT in the last 72 hours. Urine analysis:    Component Value Date/Time   BILIRUBINUR neg 05/04/2013 1428   PROTEINUR neg 05/04/2013 1428   UROBILINOGEN negative 05/04/2013 1428   NITRITE neg 05/04/2013 1428   LEUKOCYTESUR large (3+) 05/04/2013 1428   Sepsis Labs: @LABRCNTIP (procalcitonin:4,lacticidven:4)  ) Recent Results (from the past 240  hour(s))  MRSA PCR Screening     Status: None   Collection Time: 12/16/17  1:58 PM  Result Value Ref Range Status   MRSA by PCR NEGATIVE NEGATIVE Final    Comment:        The GeneXpert MRSA Assay (FDA approved for NASAL specimens only), [name] one component of a comprehensive MRSA colonization surveillance program. It [name] not intended to diagnose MRSA infection nor to guide or monitor treatment for MRSA infections. Performed at [facility], [place] [address] [place], [place] [address]   Expectorated sputum assessment w rflx to resp cult     Status: None   Collection Time: 12/16/17  6:30 PM  Result Value Ref Range Status   Specimen Description EXPECTORATED SPUTUM  Final   Special Requests NONE  Final   Sputum evaluation   Final    Sputum specimen not acceptable for testing.  Please recollect.   RESULT CALLED TO, READ BACK BY AND VERIFIED WITH: Filbert Berthold RN [id number] 12/17/17 A BROWNING Performed at [facility], [place] [address], [place], [place] [address]    Report Status 12/17/2017 FINAL  Final  Respiratory Panel by PCR  Status: None   Collection Time: 12/16/17  8:04 PM  Result Value Ref Range Status   Adenovirus NOT DETECTED NOT DETECTED Final   Coronavirus 229E NOT DETECTED NOT DETECTED Final   Coronavirus HKU1 NOT DETECTED NOT DETECTED Final   Coronavirus NL63 NOT DETECTED NOT DETECTED Final   Coronavirus OC43 NOT DETECTED NOT DETECTED Final   Metapneumovirus NOT DETECTED NOT DETECTED Final   Rhinovirus / Enterovirus NOT DETECTED NOT DETECTED Final   Influenza A NOT DETECTED NOT DETECTED Final   Influenza B NOT DETECTED NOT DETECTED Final   Parainfluenza Virus 1 NOT DETECTED NOT DETECTED Final   Parainfluenza Virus 2 NOT DETECTED NOT DETECTED Final   Parainfluenza Virus 3 NOT DETECTED NOT DETECTED Final   Parainfluenza Virus 4 NOT DETECTED NOT DETECTED Final   Respiratory Syncytial Virus NOT DETECTED NOT DETECTED Final   Bordetella pertussis NOT DETECTED NOT DETECTED  Final   Chlamydophila pneumoniae NOT DETECTED NOT DETECTED Final   Mycoplasma pneumoniae NOT DETECTED NOT DETECTED Final    Comment: Performed at [facility], [place] [address] [place],  [address]         Radiology Studies: No results found.      Scheduled Meds: . pantoprazole  40 mg Oral Daily   Continuous Infusions: . methylPREDNISolone (SOLU-MEDROL) injection 250 mg (12/18/17 1524)     LOS: 2 days    Time spent: 35 minutes    Dana Allan, MD  [facility] Pager #: [phone] 7PM-7AM contact night coverage as above

## 2017-12-19 ENCOUNTER — Inpatient Hospital Stay (HOSPITAL_COMMUNITY): Payer: Medicare Other

## 2017-12-19 ENCOUNTER — Encounter: Payer: Self-pay | Admitting: Internal Medicine

## 2017-12-19 DIAGNOSIS — Z789 Other specified health status: Secondary | ICD-10-CM

## 2017-12-19 DIAGNOSIS — Z452 Encounter for adjustment and management of vascular access device: Secondary | ICD-10-CM

## 2017-12-19 DIAGNOSIS — R0602 Shortness of breath: Secondary | ICD-10-CM

## 2017-12-19 DIAGNOSIS — Z1211 Encounter for screening for malignant neoplasm of colon: Secondary | ICD-10-CM

## 2017-12-19 LAB — BLOOD GAS, ARTERIAL
ACID-BASE EXCESS: 2 mmol/L (ref 0.0–2.0)
Acid-Base Excess: 4.5 mmol/L — ABNORMAL HIGH (ref 0.0–2.0)
BICARBONATE: 27.8 mmol/L (ref 20.0–28.0)
Bicarbonate: 30.2 mmol/L — ABNORMAL HIGH (ref 20.0–28.0)
Delivery systems: POSITIVE
Drawn by: 519031
Drawn by: 51185
Expiratory PAP: 6
FIO2: 90
FIO2: 100
Inspiratory PAP: 14
LHR: 14 {breaths}/min
O2 SAT: 95.9 %
O2 SAT: 95.8 %
PATIENT TEMPERATURE: 98.6
PCO2 ART: 36.3 mmHg (ref 32.0–48.0)
PCO2 ART: 90.1 mmHg — AB (ref 32.0–48.0)
PEEP/CPAP: 8 cmH2O
PH ART: 7.151 — AB (ref 7.350–7.450)
PO2 ART: 73.8 mmHg — AB (ref 83.0–108.0)
Patient temperature: 98.6
VT: 340 mL
pH, Arterial: 7.497 — ABNORMAL HIGH (ref 7.350–7.450)
pO2, Arterial: 107 mmHg (ref 83.0–108.0)

## 2017-12-19 LAB — SJOGRENS SYNDROME-A EXTRACTABLE NUCLEAR ANTIBODY

## 2017-12-19 LAB — POCT I-STAT 3, ART BLOOD GAS (G3+)
ACID-BASE EXCESS: 1 mmol/L (ref 0.0–2.0)
Acid-Base Excess: 2 mmol/L (ref 0.0–2.0)
Bicarbonate: 29.5 mmol/L — ABNORMAL HIGH (ref 20.0–28.0)
Bicarbonate: 31.7 mmol/L — ABNORMAL HIGH (ref 20.0–28.0)
O2 SAT: 94 %
O2 Saturation: 93 %
PH ART: 7.313 — AB (ref 7.350–7.450)
PH ART: 7.197 — AB (ref 7.350–7.450)
PO2 ART: 76 mmHg — AB (ref 83.0–108.0)
Patient temperature: 97.7
TCO2: 31 mmol/L (ref 22–32)
TCO2: 34 mmol/L — ABNORMAL HIGH (ref 22–32)
pCO2 arterial: 58.2 mmHg — ABNORMAL HIGH (ref 32.0–48.0)
pCO2 arterial: 81.1 mmHg (ref 32.0–48.0)
pO2, Arterial: 86 mmHg (ref 83.0–108.0)

## 2017-12-19 LAB — MAGNESIUM: Magnesium: 1.7 mg/dL (ref 1.7–2.4)

## 2017-12-19 LAB — ANTI-SCLERODERMA ANTIBODY: Scleroderma (Scl-70) (ENA) Antibody, IgG: <0.2 AI (ref 0.0–0.9)

## 2017-12-19 LAB — SJOGRENS SYNDROME-B EXTRACTABLE NUCLEAR ANTIBODY

## 2017-12-19 LAB — TROPONIN I
Troponin I: <0.03 ng/mL (ref <0.03)
Troponin I: <0.03 ng/mL (ref <0.03)

## 2017-12-19 LAB — PHOSPHORUS: Phosphorus: 6.6 mg/dL — ABNORMAL HIGH (ref 2.5–4.6)

## 2017-12-19 LAB — GLUCOSE, CAPILLARY
Glucose-Capillary: 195 mg/dL — ABNORMAL HIGH (ref 70–99)
Glucose-Capillary: 210 mg/dL — ABNORMAL HIGH (ref 70–99)
Glucose-Capillary: 217 mg/dL — ABNORMAL HIGH (ref 70–99)

## 2017-12-19 LAB — LACTIC ACID, PLASMA: Lactic Acid, Venous: 2.8 mmol/L (ref 0.5–1.9)

## 2017-12-19 LAB — ANTI-DNA ANTIBODY, DOUBLE-STRANDED

## 2017-12-19 LAB — PROCALCITONIN

## 2017-12-19 LAB — ALDOLASE: Aldolase: 4.8 U/L (ref 3.3–10.3)

## 2017-12-19 MED ORDER — FENTANYL CITRATE (PF) 100 MCG/2ML IJ SOLN: 100.0000 ug | Freq: Once | INTRAMUSCULAR | Status: DC

## 2017-12-19 MED ORDER — ETOMIDATE 2 MG/ML IV SOLN
20.0000 mg | Freq: Once | INTRAVENOUS | Status: DC
  Administered 2017-12-19: 20 mg via INTRAVENOUS

## 2017-12-19 MED ORDER — ORAL CARE MOUTH RINSE: 15.0000 mL | OROMUCOSAL | Status: DC

## 2017-12-19 MED ORDER — BISACODYL 10 MG RE SUPP: 10.0000 mg | Freq: Every day | RECTAL | Status: DC | PRN

## 2017-12-19 MED ORDER — ENOXAPARIN SODIUM 40 MG/0.4ML ~~LOC~~ SOLN: 40.0000 mg | SUBCUTANEOUS | Status: DC

## 2017-12-19 MED ORDER — SODIUM CHLORIDE 0.9 % IV SOLN
3.0000 ug/kg/min | INTRAVENOUS | Status: DC
  Administered 2017-12-19: 3 ug/kg/min via INTRAVENOUS
  Filled 2017-12-19 (×2): qty 20

## 2017-12-19 MED ORDER — ENOXAPARIN SODIUM 30 MG/0.3ML ~~LOC~~ SOLN
30.0000 mg | SUBCUTANEOUS | Status: DC
  Administered 2017-12-19 – 2017-12-25 (×7): 30 mg via SUBCUTANEOUS
  Filled 2017-12-19 (×7): qty 0.3

## 2017-12-19 MED ORDER — FENTANYL CITRATE (PF) 100 MCG/2ML IJ SOLN
100.0000 ug | Freq: Once | INTRAMUSCULAR | Status: AC
  Administered 2017-12-19: 100 ug via INTRAVENOUS

## 2017-12-19 MED ORDER — INSULIN ASPART 100 UNIT/ML ~~LOC~~ SOLN
2.0000 [IU] | SUBCUTANEOUS | Status: DC
  Administered 2017-12-19: 6 [IU] via SUBCUTANEOUS
  Administered 2017-12-19: 4 [IU] via SUBCUTANEOUS

## 2017-12-19 MED ORDER — MIDAZOLAM HCL 2 MG/2ML IJ SOLN
2.0000 mg | Freq: Once | INTRAMUSCULAR | Status: AC
  Administered 2017-12-19: 2 mg via INTRAVENOUS

## 2017-12-19 MED ORDER — PNEUMOCOCCAL VAC POLYVALENT 25 MCG/0.5ML IJ INJ
0.5000 mL | INJECTION | INTRAMUSCULAR | Status: AC
  Administered 2017-12-21: 0.5 mL via INTRAMUSCULAR
  Filled 2017-12-19: qty 0.5

## 2017-12-19 MED ORDER — ORAL CARE MOUTH RINSE: 15.0000 mL | Freq: Two times a day (BID) | OROMUCOSAL | Status: DC

## 2017-12-19 MED ORDER — CHLORHEXIDINE GLUCONATE 0.12% ORAL RINSE (MEDLINE KIT): 15.0000 mL | Freq: Two times a day (BID) | OROMUCOSAL | Status: DC

## 2017-12-19 MED ORDER — VITAL HIGH PROTEIN PO LIQD: 1000.0000 mL | ORAL | Status: DC

## 2017-12-19 MED ORDER — PROPOFOL 1000 MG/100ML IV EMUL
25.0000 ug/kg/min | INTRAVENOUS | Status: DC
  Administered 2017-12-19: 25 ug/kg/min via INTRAVENOUS
  Administered 2017-12-19: 35 ug/kg/min via INTRAVENOUS
  Administered 2017-12-20 (×3): 40 ug/kg/min via INTRAVENOUS
  Administered 2017-12-20: 15 ug/kg/min via INTRAVENOUS
  Filled 2017-12-19 (×7): qty 100

## 2017-12-19 MED ORDER — DOCUSATE SODIUM 50 MG/5ML PO LIQD
100.0000 mg | Freq: Two times a day (BID) | ORAL | Status: DC | PRN
  Administered 2017-12-23: 100 mg
  Filled 2017-12-19: qty 10

## 2017-12-19 MED ORDER — ARTIFICIAL TEARS OPHTHALMIC OINT
1.0000 "application " | TOPICAL_OINTMENT | Freq: Three times a day (TID) | OPHTHALMIC | Status: DC
  Administered 2017-12-19 – 2017-12-23 (×10): 1 via OPHTHALMIC
  Filled 2017-12-19: qty 3.5

## 2017-12-19 MED ORDER — FENTANYL CITRATE (PF) 100 MCG/2ML IJ SOLN
100.0000 ug | Freq: Once | INTRAMUSCULAR | Status: AC | PRN
  Administered 2017-12-21: 100 ug via INTRAVENOUS

## 2017-12-19 MED ORDER — MIDAZOLAM HCL 2 MG/2ML IJ SOLN
1.0000 mg | INTRAMUSCULAR | Status: DC | PRN
  Administered 2017-12-21 – 2017-12-25 (×3): 1 mg via INTRAVENOUS
  Filled 2017-12-19 (×3): qty 2

## 2017-12-19 MED ORDER — PANTOPRAZOLE SODIUM 40 MG PO PACK
40.0000 mg | PACK | Freq: Every day | ORAL | Status: DC
  Administered 2017-12-19 – 2017-12-20 (×2): 40 mg
  Filled 2017-12-19 (×2): qty 20

## 2017-12-19 MED ORDER — MIDAZOLAM HCL 2 MG/2ML IJ SOLN
INTRAMUSCULAR | Status: AC
  Filled 2017-12-19: qty 2

## 2017-12-19 MED ORDER — CHLORHEXIDINE GLUCONATE 0.12 % MT SOLN
15.0000 mL | Freq: Two times a day (BID) | OROMUCOSAL | Status: DC
  Administered 2017-12-26 – 2017-12-30 (×9): 15 mL via OROMUCOSAL
  Filled 2017-12-19 (×9): qty 15

## 2017-12-19 MED ORDER — NOREPINEPHRINE 16 MG/250ML-% IV SOLN
0.0000 ug/min | INTRAVENOUS | Status: DC
  Administered 2017-12-19: 10 ug/min via INTRAVENOUS
  Administered 2017-12-20: 13 ug/min via INTRAVENOUS
  Administered 2017-12-21: 1.5 ug/min via INTRAVENOUS
  Filled 2017-12-19 (×2): qty 250

## 2017-12-19 MED ORDER — ORAL CARE MOUTH RINSE
15.0000 mL | OROMUCOSAL | Status: DC
  Administered 2017-12-19 – 2017-12-25 (×58): 15 mL via OROMUCOSAL

## 2017-12-19 MED ORDER — FENTANYL 2500MCG IN NS 250ML (10MCG/ML) PREMIX INFUSION
25.0000 ug/h | INTRAVENOUS | Status: DC
  Administered 2017-12-19: 50 ug/h via INTRAVENOUS
  Filled 2017-12-19: qty 250

## 2017-12-19 MED ORDER — FENTANYL BOLUS VIA INFUSION
50.0000 ug | INTRAVENOUS | Status: DC | PRN
  Administered 2017-12-21 – 2017-12-24 (×10): 50 ug via INTRAVENOUS
  Filled 2017-12-19: qty 50

## 2017-12-19 MED ORDER — PRO-STAT SUGAR FREE PO LIQD
30.0000 mL | Freq: Three times a day (TID) | ORAL | Status: DC
  Administered 2017-12-19 – 2017-12-23 (×13): 30 mL via ORAL
  Filled 2017-12-19 (×13): qty 30

## 2017-12-19 MED ORDER — VITAL HIGH PROTEIN PO LIQD
1000.0000 mL | ORAL | Status: DC
  Administered 2017-12-20 – 2017-12-22 (×4): 1000 mL
  Filled 2017-12-19 (×2): qty 1000

## 2017-12-19 MED ORDER — CISATRACURIUM BOLUS VIA INFUSION
0.0500 mg/kg | Freq: Once | INTRAVENOUS | Status: AC
  Administered 2017-12-19: 3.5 mg via INTRAVENOUS
  Filled 2017-12-19: qty 4

## 2017-12-19 MED ORDER — CHLORHEXIDINE GLUCONATE 0.12% ORAL RINSE (MEDLINE KIT)
15.0000 mL | Freq: Two times a day (BID) | OROMUCOSAL | Status: DC
  Administered 2017-12-19 – 2017-12-25 (×13): 15 mL via OROMUCOSAL

## 2017-12-19 MED ORDER — INSULIN ASPART 100 UNIT/ML ~~LOC~~ SOLN
2.0000 [IU] | SUBCUTANEOUS | Status: DC
  Administered 2017-12-19: 6 [IU] via SUBCUTANEOUS
  Administered 2017-12-20 (×4): 4 [IU] via SUBCUTANEOUS
  Administered 2017-12-20: 2 [IU] via SUBCUTANEOUS
  Administered 2017-12-21 (×5): 4 [IU] via SUBCUTANEOUS
  Administered 2017-12-21: 6 [IU] via SUBCUTANEOUS
  Administered 2017-12-22 (×2): 4 [IU] via SUBCUTANEOUS
  Administered 2017-12-22: 6 [IU] via SUBCUTANEOUS
  Administered 2017-12-22 (×3): 4 [IU] via SUBCUTANEOUS
  Administered 2017-12-23: 2 [IU] via SUBCUTANEOUS
  Administered 2017-12-23 (×4): 4 [IU] via SUBCUTANEOUS
  Administered 2017-12-23: 2 [IU] via SUBCUTANEOUS
  Administered 2017-12-24 – 2017-12-25 (×7): 4 [IU] via SUBCUTANEOUS
  Administered 2017-12-25: 2 [IU] via SUBCUTANEOUS
  Administered 2017-12-25 (×4): 4 [IU] via SUBCUTANEOUS
  Administered 2017-12-26: 6 [IU] via SUBCUTANEOUS
  Administered 2017-12-26 – 2017-12-27 (×7): 4 [IU] via SUBCUTANEOUS
  Administered 2017-12-27: 2 [IU] via SUBCUTANEOUS
  Administered 2017-12-27: 6 [IU] via SUBCUTANEOUS
  Administered 2017-12-28: 4 [IU] via SUBCUTANEOUS
  Administered 2017-12-28: 2 [IU] via SUBCUTANEOUS
  Administered 2017-12-28: 4 [IU] via SUBCUTANEOUS
  Administered 2017-12-28: 2 [IU] via SUBCUTANEOUS

## 2017-12-19 MED ORDER — FUROSEMIDE 10 MG/ML IJ SOLN
60.0000 mg | Freq: Once | INTRAMUSCULAR | Status: AC
  Administered 2017-12-19: 60 mg via INTRAVENOUS
  Filled 2017-12-19: qty 6

## 2017-12-19 MED ORDER — FENTANYL BOLUS VIA INFUSION
50.0000 ug | INTRAVENOUS | Status: DC | PRN
  Filled 2017-12-19: qty 50

## 2017-12-19 MED ORDER — FUROSEMIDE 10 MG/ML IJ SOLN: 20.0000 mg | Freq: Two times a day (BID) | INTRAMUSCULAR | Status: DC

## 2017-12-19 MED ORDER — MIDAZOLAM HCL 2 MG/2ML IJ SOLN
1.0000 mg | INTRAMUSCULAR | Status: DC | PRN
  Filled 2017-12-19: qty 2

## 2017-12-19 MED ORDER — AMLODIPINE BESYLATE 5 MG PO TABS: 5.0000 mg | ORAL_TABLET | Freq: Every day | ORAL | Status: DC

## 2017-12-19 MED ORDER — SODIUM CHLORIDE 0.9 % IV SOLN
1.0000 g | Freq: Two times a day (BID) | INTRAVENOUS | Status: DC
  Administered 2017-12-19 – 2017-12-20 (×2): 1 g via INTRAVENOUS
  Filled 2017-12-19 (×3): qty 1

## 2017-12-19 MED ORDER — FENTANYL CITRATE (PF) 100 MCG/2ML IJ SOLN: 50.0000 ug | Freq: Once | INTRAMUSCULAR | Status: DC

## 2017-12-19 MED ORDER — FENTANYL 2500MCG IN NS 250ML (10MCG/ML) PREMIX INFUSION
100.0000 ug/h | INTRAVENOUS | Status: DC
  Administered 2017-12-19: 100 ug/h via INTRAVENOUS
  Administered 2017-12-19 – 2017-12-20 (×3): 300 ug/h via INTRAVENOUS
  Administered 2017-12-21 (×2): 200 ug/h via INTRAVENOUS
  Administered 2017-12-22: 100 ug/h via INTRAVENOUS
  Administered 2017-12-22: 200 ug/h via INTRAVENOUS
  Administered 2017-12-23 – 2017-12-25 (×2): 100 ug/h via INTRAVENOUS
  Filled 2017-12-19 (×9): qty 250

## 2017-12-19 MED ORDER — ROCURONIUM BROMIDE 50 MG/5ML IV SOLN
60.0000 mg | Freq: Once | INTRAVENOUS | Status: AC
  Administered 2017-12-19: 60 mg via INTRAVENOUS
  Filled 2017-12-19: qty 6

## 2017-12-19 MED ORDER — FENTANYL CITRATE (PF) 100 MCG/2ML IJ SOLN
INTRAMUSCULAR | Status: AC
  Filled 2017-12-19: qty 2

## 2017-12-19 NOTE — Progress Notes (Signed)
ANTIBIOTIC CONSULT NOTE - INITIAL  Pharmacy Consult for Cefepime Indication: pneumonia   Patient Measurements: Height: 5\' 1"  (154.9 cm) Weight: 155 lb 10.3 oz (70.6 kg) IBW/kg (Calculated) : 47.8 Adjusted Body Weight:    Vital Signs: Temp: 98.6 F (37 C) (08/30 1100) Temp Source: Axillary (08/30 1100) BP: 135/66 (08/30 1100) Pulse Rate: 90 (08/30 1242) Intake/Output from previous day: 08/29 0701 - 08/30 0700 In: 469.6 [P.O.:240; IV Piggyback:229.6] Out: 1850 [Urine:1850] Intake/Output from this shift: No intake/output data recorded.  Labs: Recent Labs    12/16/17 1629 12/17/17 0324  WBC 10.2 7.6  HGB 10.4* 9.2*  PLT 124* 113*  CREATININE 0.66 0.71   Medical History: Past Medical History:  Diagnosis Date  . Acute respiratory failure ([place])   . CAP (community acquired pneumonia)   . Hyperlipidemia   . Hypertension   . Metabolic syndrome      ID: Resuming cefepime for possible aspiration pneumonia. SCr 0.7, LA 2.8.  8/27 cefepime > 8/27 vancomycin >  Fluconazole po 8/25> Vanco 8/24>> Rocephin 8/20>> Azithro 8/20>>  [place] Cultures: 8/18 Sputum: heavy yeast 8/18: BC MRSE 8/18 BC x 2: negative  Goal of Therapy:  Vancomycin trough level 15-20 mcg/ml  Plan:  Cefepime 1g IV q8 hrs Monitor renal fx, cultures   Tasha [name], Tasha [name] 12/19/2017,1:42 PM

## 2017-12-19 NOTE — Progress Notes (Signed)
Lactic acid reported. 2.8. I do not think this is related to sepsis but due to worsening respiratory failure.   Erick Colace ACNP-BC [place] Pager # [phone] OR # [phone] if no answer

## 2017-12-19 NOTE — Procedures (Signed)
Intubation Procedure Note Tasha [name] [id number] 11-21-1950  Procedure: Intubation Indications: Respiratory insufficiency  Procedure Details Consent: Risks of procedure as well as the alternatives and risks of each were explained to the (patient/caregiver).  Consent for procedure obtained. Time Out: Verified patient identification, verified procedure, site/side was marked, verified correct patient position, special equipment/implants available, medications/allergies/relevent history reviewed, required imaging and test results available.  Performed  Maximum sterile technique was used including antiseptics, cap, gloves, hand hygiene and mask.  MAC and 3  7.5 ETT using glide scope 3 blade. Excellent view   Evaluation Hemodynamic Status: BP stable throughout; O2 sats: stable throughout Patient's Current Condition: stable Complications: No apparent complications Patient did tolerate procedure well. Chest X-ray ordered to verify placement.  CXR: pending.   Tasha [name] 12/19/2017  Erick Colace ACNP-BC [place] Pager # [phone] OR # [phone] if no answer

## 2017-12-19 NOTE — Progress Notes (Signed)
SLP Cancellation Note  Patient Details Name: Tasha [name] MRN: [id number] DOB: 1950/08/22   Cancelled treatment:        Attempted swallow assessment. Pt on Bipap; RN to page when on nasal cannula.    Houston Siren 12/19/2017, 12:05 PM  Orbie Pyo Colvin Caroli.Ed [employer] [phone]

## 2017-12-19 NOTE — Progress Notes (Signed)
   12/19/17 1500  Clinical Encounter Type  Visited With Family  Visit Type Spiritual support;Initial  Referral From Nurse  Consult/Referral To Chaplain  Spiritual Encounters  Spiritual Needs Prayer;Emotional;Grief support  Stress Factors  Family Stress Factors Exhausted;Major life changes   Went to see patient and Nurse referred me to see family in waiting room. Chaplain prayed and affirmed there brokenness. Chaplain gave words of encouragement and hope.

## 2017-12-19 NOTE — Progress Notes (Signed)
RT NOTES: Patient transported to 3M09 on BIPAP

## 2017-12-19 NOTE — Progress Notes (Signed)
[place] TEAM 1 - Stepdown/ICU TEAM  Archie Atilano  WUJ:[id number] DOB: Feb 12, 1951 DOA: 12/16/2017 PCP: Dettinger, Fransisca Kaufmann, MD    Brief Narrative:  67 yo F w/ a hx of HTN, GERD, and HLD who was admitted to [facility] for acute respiratory failure thought to be due to CAP. She did not improve w/ tx for same. Due to her slow improvement w/ prolonged requirement for high O2 support, transfer was sought for Pulmonary consultation.  Significant Events: 8/27 admit on transfer from [facility] 8/29 TTE - EF 65-70 % with no WMA and grade 1 diastolic dysfunction  Subjective: The patient develop recurrent respiratory distress last night with respiratory rates in the 40-50 range and oxygen saturations as low as 86% despite 11 L high flow nasal cannula support.  She required transition to BiPAP support.  Staff report she responded well to a one-time dose of Lasix.  Assessment & Plan:  Acute severe persistent hypoxic respiratory failure Persists - PCCM following - O2 requirement remains high   Subacute ILD - Bilateral Consolidation High ESR - ANA + in speckled pattern - RVP negative / negative infection workup - ?BOOP/COP - PCCM has started empiric tx for BOOP  Hypertension  BP controlled now - avoid hydralazine as pt had poor reaction  Hyperlipidemia Holding lipitor for now w/ very limited oral intake   DVT prophylaxis: SCDs Code Status: FULL CODE Family Communication: spoke with family at bedside Disposition Plan: SDU  Consultants:  PCCM  Antimicrobials:  Cefepime 8/27 > 8/28 Vancomycin 8/27 > 8/28  Objective: Blood pressure (!) 172/79, pulse 68, temperature 98 F (36.7 C), temperature source Oral, resp. rate (!) 28, height _0  (1.549 m), weight 70.6 kg, SpO2 97 %.  Intake/Output Summary (Last 24 hours) at 12/19/2017 0926 Last data filed at 12/19/2017 0650 Gross per 24 hour  Intake 229.57 ml  Output 1850 ml  Net -1620.43 ml   Filed Weights   12/16/17 1300 12/16/17 1430 12/17/17  0500  Weight: 69 kg 69 kg 70.6 kg    Examination: General: tachypneic at 40 breaths per minute on BiPAP - alert and conversant Lungs: crackles bilateral bases - no wheezing Cardiovascular: Regular rate and rhythm without murmur gallop or rub normal S1 and S2 Abdomen: Nontender, nondistended, soft, bowel sounds positive, no rebound, no ascites, no appreciable mass Extremities: No significant cyanosis, clubbing, or edema bilateral lower extremities  CBC: Recent Labs  Lab 12/16/17 1629 12/17/17 0324  WBC 10.2 7.6  HGB 10.4* 9.2*  HCT 32.5* 29.4*  MCV 88.6 89.4  PLT 124* 956*   Basic Metabolic Panel: Recent Labs  Lab 12/16/17 1629 12/17/17 0324  NA 138 138  K 4.1 4.6  CL 105 107  CO2 25 24  GLUCOSE 98 103*  BUN 7* 7*  CREATININE 0.66 0.71  CALCIUM 8.4* 8.1*   GFR: Estimated Creatinine Clearance: 61.3 mL/min (by C-G formula based on SCr of 0.71 mg/dL).  Liver Function Tests: Recent Labs  Lab 12/17/17 0324  AST 30  ALT 17  ALKPHOS 43  BILITOT 1.3*  PROT 4.8*  ALBUMIN 2.1*     Recent Results (from the past 240 hour(s))  MRSA PCR Screening     Status: None   Collection Time: 12/16/17  1:58 PM  Result Value Ref Range Status   MRSA by PCR NEGATIVE NEGATIVE Final    Comment:        The GeneXpert MRSA Assay (FDA approved for NASAL specimens only), is one component of a comprehensive MRSA  colonization surveillance program. It is not intended to diagnose MRSA infection nor to guide or monitor treatment for MRSA infections. Performed at [facility], [place] [address], [place], [place] [address]   Expectorated sputum assessment w rflx to resp cult     Status: None   Collection Time: 12/16/17  6:30 PM  Result Value Ref Range Status   Specimen Description EXPECTORATED SPUTUM  Final   Special Requests NONE  Final   Sputum evaluation   Final    Sputum specimen not acceptable for testing.  Please recollect.   RESULT CALLED TO, READ BACK BY AND VERIFIED WITH:  Filbert Berthold RN [phone] 12/17/17 A BROWNING Performed at [facility], [place] [address] [place], [place] [address]    Report Status 12/17/2017 FINAL  Final  Respiratory Panel by PCR     Status: None   Collection Time: 12/16/17  8:04 PM  Result Value Ref Range Status   Adenovirus NOT DETECTED NOT DETECTED Final   Coronavirus 229E NOT DETECTED NOT DETECTED Final   Coronavirus HKU1 NOT DETECTED NOT DETECTED Final   Coronavirus NL63 NOT DETECTED NOT DETECTED Final   Coronavirus OC43 NOT DETECTED NOT DETECTED Final   Metapneumovirus NOT DETECTED NOT DETECTED Final   Rhinovirus / Enterovirus NOT DETECTED NOT DETECTED Final   Influenza A NOT DETECTED NOT DETECTED Final   Influenza B NOT DETECTED NOT DETECTED Final   Parainfluenza Virus 1 NOT DETECTED NOT DETECTED Final   Parainfluenza Virus 2 NOT DETECTED NOT DETECTED Final   Parainfluenza Virus 3 NOT DETECTED NOT DETECTED Final   Parainfluenza Virus 4 NOT DETECTED NOT DETECTED Final   Respiratory Syncytial Virus NOT DETECTED NOT DETECTED Final   Bordetella pertussis NOT DETECTED NOT DETECTED Final   Chlamydophila pneumoniae NOT DETECTED NOT DETECTED Final   Mycoplasma pneumoniae NOT DETECTED NOT DETECTED Final    Comment: Performed at [facility], [place] [address] [place], [place] [address]     Scheduled Meds: . chlorhexidine  15 mL Mouth Rinse BID  . furosemide  60 mg Intravenous Once  . mouth rinse  15 mL Mouth Rinse q12n4p  . pantoprazole  40 mg Oral Daily   Continuous Infusions: . methylPREDNISolone (SOLU-MEDROL) injection Stopped (12/19/17 0409)     LOS: 3 days   Cherene Altes, MD [facility] Office  [phone] Pager - Text Page per Amion  If 7PM-7AM, please contact night-coverage per Amion 12/19/2017, 9:26 AM

## 2017-12-19 NOTE — Consult Note (Signed)
[facility] CM Primary Care Navigator  12/19/2017  Tasha [name] 1951-03-29 [id number]   Met with patientand daughter [name]- translating due to the language barrier) atthe bedside to identify possible discharge needs. Daughterreports that patient "couldn't breath, coughing and increased tiredness- pneumonia to both lungs "thathad ledto this admission.She was previously diagnosed with pneumonia in the outpatient setting and was treated with Levaquin. (acute hypoxic respiratory failure in setting of bilateral pulmonary infiltrates, working diagnosis of subacute interstitial lung disease versus organizing pneumonia)  Patient's daughterendorsesDr.Joshua Dettinger/ Delynn Flavin with [facility] as theprimary care provider.   [facility] [place].   Daughter reportsthatpatientmanagesherown medicationsat homestraight out of the containers.  Patient was driving prior to admission, buther family members/ children will providetransportationto herdoctors'appointments after discharge.  Patienthas been physically independent prior to admission. Her husband Lynford Humphrey) and daughter will be her primary caregivers at home.   Anticipated plan for discharge is home with family members per daughter.   Patient's daughtervoicedunderstanding to call primary care provider's office for a post discharge follow-up appointment within 1- 2 weeksor sooner if needs arise.Patient letter (with PCP's contact number) was provided asareminder.  Explained to patient's daughterregardingTHN CM services available for health managementat home but she denies any pressing issues or needs at this time. Daughter verbalizedunderstanding to talk with primary care provider on next visit for futher needs to manage health issues at home. Daughter verbalized understandingto seekreferral  to [facility] care managementfrom primary care provider ifdeemed necessary andappropriatefor furtherservices in thefuture.  [facility] care management information provided for future needs thatpatientmay have.  In the meantime, patient's daughter had optedand verbally agreedforEMMI Pneumonia calls tofollow-up with patient's recovery at home.  Referralmade forEMMI Pneumonia Calls after discharge.   For additional questions please contact:  Karin Golden A. Kytzia Gienger, BSN, RN-BC [facility] PRIMARY CARE Navigator Cell: [phone]

## 2017-12-19 NOTE — Progress Notes (Addendum)
Spoke to Dr. Gladys Damme concerning pt's plateau pressures.  New order given.  Tidal volume dropped per order. Will continue to monitor.

## 2017-12-19 NOTE — Procedures (Signed)
Central Venous Catheter Insertion Procedure Note Tasha [name] [id number] 05/17/1950  Procedure: Insertion of Central Venous Catheter Indications: Assessment of intravascular volume, Drug and/or fluid administration and Frequent blood sampling  Procedure Details Consent: Risks of procedure as well as the alternatives and risks of each were explained to the (patient/caregiver).  Consent for procedure obtained. Time Out: Verified patient identification, verified procedure, site/side was marked, verified correct patient position, special equipment/implants available, medications/allergies/relevent history reviewed, required imaging and test results available.  Performed  Maximum sterile technique was used including antiseptics, cap, gloves, gown, hand hygiene, mask and sheet. Skin prep: Chlorhexidine; local anesthetic administered A antimicrobial bonded/coated triple lumen catheter was placed in the left subclavian vein using the Seldinger technique.  Evaluation Blood flow good Complications: No apparent complications Patient did tolerate procedure well. Chest X-ray ordered to verify placement.  CXR: pending.  Tasha [name] 12/19/2017, 2:11 PM  Tasha [name] ACNP-BC [place] Pager # [phone] OR # [phone] if no answer

## 2017-12-19 NOTE — Progress Notes (Addendum)
Train of 4 rechecked and pt w/ 0 twitches at 50. Paged CCM NP, awaiting response. Report given to night shift RN who will speak w/ covering MD and continue to monitor.
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
## 2017-12-19 NOTE — Procedures (Addendum)
Bronchoscopy Procedure Note Euline Kimbler [id number] 06-May-1950  Procedure: Bronchoscopy Indications: Diagnostic evaluation of the airways and Obtain specimens for culture and/or other diagnostic studies  Procedure Details Consent: Risks of procedure as well as the alternatives and risks of each were explained to the (patient/caregiver).  Consent for procedure obtained. Time Out: Verified patient identification, verified procedure, site/side was marked, verified correct patient position, special equipment/implants available, medications/allergies/relevent history reviewed, required imaging and test results available.  Performed  In preparation for procedure, patient was given 100% FiO2 and bronchoscope lubricated. Sedation: Benzodiazepines, Muscle relaxants and Etomidate  Airway entered and the following bronchi were examined: RML and RLL.  Airway evaluation to carina pristine; at that point the right middle and RLL were entered and airways were w/out discharge or inflammation. BAL was obtained from the RML (right medial) and RLL.   Procedures performed: Brushings performed Bronchoscope removed.    Evaluation Hemodynamic Status: BP stable throughout; O2 sats: stable throughout Patient's Current Condition: stable Specimens:  Sent  } Complications: No apparent complications Patient did tolerate procedure well.   [name] 12/19/2017    STaff note  I personally sueprvised the entire procedure from the bedside start to finish      SIGNATURE    Dr. Brand Males, M.D., F.C.C.P,  Pulmonary and Critical Care Medicine Staff Physician, [place] Director - Interstitial Lung Disease  Program  Pulmonary [place] at [place], [place], [address]  Pager: [phone], If no answer or between  15:00h - 7:00h: call [phone] Telephone: [phone]  6:07 PM 12/20/2017

## 2017-12-19 NOTE — Progress Notes (Signed)
2030-Pt has no twitches of train of four at 50 on 2.5 of nimbex. eLink notified, Nimbex stopped per MD Panchal.   2300-Pt become desynchronosis with vent, HR and RR elevated. Fentanyl maxed at 378mcg/hr. eLink notified and nimbex restarted at 2.5. Train of four still zero at 50. Will continue to assess.

## 2017-12-19 NOTE — Progress Notes (Addendum)
eLink Physician-Brief Progress Note Patient Name: Tasha [name] DOB: [birth year] MRN: [id number]   Date of Service  12/19/2017  HPI/Events of Note  RT called saying plateau pressure 34  eICU Interventions  Advised to drop tidal volume from 66ml/kg that patient is currently on to 69ml/kg and recheck plateau pressures     Intervention Category Intermediate Interventions: Other:  Sharia Reeve 12/19/2017, 8:31 PM

## 2017-12-19 NOTE — Progress Notes (Signed)
Cortrak Tube Team Note:  Consult received to place a Cortrak feeding tube.   A 10 F Cortrak tube was placed in the R nare and secured with a nasal bridle at 80 cm. Per the Cortrak monitor reading the tube tip is post pyloric.   X-ray is required, abdominal x-ray has been ordered by the Cortrak team. Please confirm tube placement before using the Cortrak tube.   If the tube becomes dislodged please keep the tube and contact the Cortrak team at [email or web address] (password [email or web address]) for replacement.  If after hours and replacement cannot be delayed, place a NG tube and confirm placement with an abdominal x-ray.    [place], [place], [place] Pager [phone] After Hours Pager

## 2017-12-19 NOTE — Progress Notes (Signed)
Initial Nutrition Assessment  DOCUMENTATION CODES:   Not applicable  INTERVENTION:   Tube Feeding:  Vital High Protein @ 30 ml/hr Pro-Stat 30 mL TID Provides 108 g of protein, 1020 kcals, 1109 mL of free water Meets 100% protein needs  TF regimen and propofol at current rate providing 1299 total kcal/day (98 % of kcal needs)   NUTRITION DIAGNOSIS:   Inadequate oral intake related to acute illness as evidenced by NPO status.   GOAL:   Provide needs based on ASPEN/SCCM guidelines   MONITOR:   TF tolerance, Vent status, Labs, Weight trends  REASON FOR ASSESSMENT:   Ventilator, Consult Enteral/tube feeding initiation and management  ASSESSMENT:    67 yo female admitted with acute severe persistent respiratory failure, failed BiPap requiring intubation, 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 PMH of HTN, HLD, GERD    8/27 Transfer form [facility] 8/29 TEE EF 65-70% 8/30 Intubated, Bronch  Patient is currently intubated on ventilator support, sedated, starting paralytic MV: 7.7 L/min Temp (24hrs), Avg:97.7 F (36.5 C), Min:96.4 F (35.8 C), Max:98.6 F (37 C)  Propofol: 10.6 ml/hr  Limited documentation of po intake prior to intubation. Recorded po intake 50-100% of meals.   Utilizing EDW 68.5 kg; current wt 70.6 kg. +1 L  Cortrak tube placement today  Labs: CRP 6.9 Meds: solumedrol   NUTRITION - FOCUSED PHYSICAL EXAM:    Most Recent Value  Orbital Region  No depletion  Upper Arm Region  No depletion  Thoracic and Lumbar Region  No depletion  Buccal Region  No depletion  Temple Region  Mild depletion  Clavicle Bone Region  No depletion  Clavicle and Acromion Bone Region  No depletion  Scapular Bone Region  No depletion  Dorsal Hand  No depletion  Patellar Region  No depletion  Anterior Thigh Region  No depletion  Posterior Calf Region  No depletion  Edema (RD Assessment)  None       Diet Order:   Diet Order            Diet NPO time specified  Diet  effective now              EDUCATION NEEDS:   Not appropriate for education at this time  Skin:  Skin Assessment: Reviewed RN Assessment  Last BM:  8/30  Height:   Ht Readings from Last 1 Encounters:  12/16/17 5\' 1"  (1.549 m)    Weight:   Wt Readings from Last 1 Encounters:  12/17/17 70.6 kg    Ideal Body Weight:     BMI:  Body mass index is 29.41 kg/m.  Estimated Nutritional Needs:   Kcal:  1321 kcals   Protein:  105-137 g  Fluid:  >/ = 1.7 L   Kerman Passey MS, RD, LDN, CNSC [phone] Pager  [phone] Weekend/On-Call Pager'

## 2017-12-19 NOTE — Procedures (Signed)
Arterial Catheter Insertion Procedure Note Tasha [name] [id number] 04-26-1950  Procedure: Insertion of Arterial Catheter  Indications: Blood pressure monitoring and Frequent blood sampling  Procedure Details Consent: Risks of procedure as well as the alternatives and risks of each were explained to the (patient/caregiver).  Consent for procedure obtained. Time Out: Verified patient identification, verified procedure, site/side was marked, verified correct patient position, special equipment/implants available, medications/allergies/relevent history reviewed, required imaging and test results available.  Performed  Maximum sterile technique was used including antiseptics, cap, gloves, gown, hand hygiene, mask and sheet. Skin prep: Chlorhexidine; local anesthetic administered 22 gauge catheter was inserted into left radial artery using the Seldinger technique. ULTRASOUND GUIDANCE USED: NO Evaluation Blood flow good; BP tracing good. Complications: No apparent complications.   Tasha [name] 12/19/2017

## 2017-12-19 NOTE — Progress Notes (Signed)
RT NOTES: Placed patient on bipap d/t respiratory distress. Patient on 15L HFNC with sats of 90%. Placed on bipap 14/6 90%. Sats now 96%. Will continue to monitor.

## 2017-12-20 ENCOUNTER — Inpatient Hospital Stay (HOSPITAL_COMMUNITY): Payer: Medicare Other

## 2017-12-20 DIAGNOSIS — J189 Pneumonia, unspecified organism: Secondary | ICD-10-CM

## 2017-12-20 DIAGNOSIS — I10 Essential (primary) hypertension: Secondary | ICD-10-CM

## 2017-12-20 DIAGNOSIS — J939 Pneumothorax, unspecified: Secondary | ICD-10-CM

## 2017-12-20 DIAGNOSIS — Z9689 Presence of other specified functional implants: Secondary | ICD-10-CM

## 2017-12-20 DIAGNOSIS — J9602 Acute respiratory failure with hypercapnia: Secondary | ICD-10-CM

## 2017-12-20 LAB — POCT I-STAT 3, ART BLOOD GAS (G3+)
ACID-BASE EXCESS: 1 mmol/L (ref 0.0–2.0)
ACID-BASE EXCESS: 1 mmol/L (ref 0.0–2.0)
Acid-Base Excess: 2 mmol/L (ref 0.0–2.0)
BICARBONATE: 30.8 mmol/L — AB (ref 20.0–28.0)
Bicarbonate: 26.9 mmol/L (ref 20.0–28.0)
Bicarbonate: 29.9 mmol/L — ABNORMAL HIGH (ref 20.0–28.0)
O2 SAT: 97 %
O2 SAT: 98 %
O2 Saturation: 96 %
PCO2 ART: 65.5 mmHg — AB (ref 32.0–48.0)
PH ART: 7.223 — AB (ref 7.350–7.450)
PH ART: 7.343 — AB (ref 7.350–7.450)
PO2 ART: 104 mmHg (ref 83.0–108.0)
Patient temperature: 37.2
Patient temperature: 99.7
TCO2: 28 mmol/L (ref 22–32)
TCO2: 32 mmol/L (ref 22–32)
TCO2: 33 mmol/L — ABNORMAL HIGH (ref 22–32)
pCO2 arterial: 74.2 mmHg (ref 32.0–48.0)
pCO2 arterial: 49.6 mmHg — ABNORMAL HIGH (ref 32.0–48.0)
pH, Arterial: 7.27 — ABNORMAL LOW (ref 7.350–7.450)
pO2, Arterial: 95 mmHg (ref 83.0–108.0)
pO2, Arterial: 104 mmHg (ref 83.0–108.0)

## 2017-12-20 LAB — COMPREHENSIVE METABOLIC PANEL
ALBUMIN: 2.9 g/dL — AB (ref 3.5–5.0)
ALT: 21 U/L (ref 0–44)
AST: 19 U/L (ref 15–41)
Alkaline Phosphatase: 57 U/L (ref 38–126)
Anion gap: 10 (ref 5–15)
BUN: 26 mg/dL — AB (ref 8–23)
CO2: 30 mmol/L (ref 22–32)
CREATININE: 1.78 mg/dL — AB (ref 0.44–1.00)
Calcium: 8.4 mg/dL — ABNORMAL LOW (ref 8.9–10.3)
Chloride: 105 mmol/L (ref 98–111)
GFR calc Af Amer: 33 mL/min — ABNORMAL LOW (ref >60)
GFR calc non Af Amer: 28 mL/min — ABNORMAL LOW (ref >60)
GLUCOSE: 176 mg/dL — AB (ref 70–99)
Potassium: 3.4 mmol/L — ABNORMAL LOW (ref 3.5–5.1)
Sodium: 145 mmol/L (ref 135–145)
Total Bilirubin: 0.5 mg/dL (ref 0.3–1.2)
Total Protein: 6.6 g/dL (ref 6.5–8.1)

## 2017-12-20 LAB — GLUCOSE, CAPILLARY
GLUCOSE-CAPILLARY: 172 mg/dL — AB (ref 70–99)
GLUCOSE-CAPILLARY: 171 mg/dL — AB (ref 70–99)
GLUCOSE-CAPILLARY: 182 mg/dL — AB (ref 70–99)
GLUCOSE-CAPILLARY: 139 mg/dL — AB (ref 70–99)
Glucose-Capillary: 156 mg/dL — ABNORMAL HIGH (ref 70–99)

## 2017-12-20 LAB — BASIC METABOLIC PANEL
ANION GAP: 14 (ref 5–15)
BUN: 26 mg/dL — AB (ref 8–23)
CO2: 26 mmol/L (ref 22–32)
CREATININE: 1.72 mg/dL — AB (ref 0.44–1.00)
Calcium: 8.3 mg/dL — ABNORMAL LOW (ref 8.9–10.3)
Chloride: 104 mmol/L (ref 98–111)
GFR calc Af Amer: 34 mL/min — ABNORMAL LOW (ref >60)
GFR, EST NON AFRICAN AMERICAN: 30 mL/min — AB (ref >60)
GLUCOSE: 173 mg/dL — AB (ref 70–99)
Potassium: 3.4 mmol/L — ABNORMAL LOW (ref 3.5–5.1)
Sodium: 144 mmol/L (ref 135–145)

## 2017-12-20 LAB — BODY FLUID CELL COUNT WITH DIFFERENTIAL
Eos, Fluid: 2 %
LYMPHS FL: 78 %
MONOCYTE-MACROPHAGE-SEROUS FLUID: 13 % — AB (ref 50–90)
Neutrophil Count, Fluid: 7 % (ref 0–25)
Total Nucleated Cell Count, Fluid: 93 cu mm (ref 0–1000)

## 2017-12-20 LAB — CBC WITH DIFFERENTIAL/PLATELET
ABS IMMATURE GRANULOCYTES: 0.3 10*3/uL — AB (ref 0.0–0.1)
Basophils Absolute: 0 10*3/uL (ref 0.0–0.1)
Basophils Relative: 0 %
Eosinophils Absolute: 0 10*3/uL (ref 0.0–0.7)
Eosinophils Relative: 0 %
HEMATOCRIT: 37.3 % (ref 36.0–46.0)
HEMOGLOBIN: 11.6 g/dL — AB (ref 12.0–15.0)
IMMATURE GRANULOCYTES: 1 %
LYMPHS ABS: 0.8 10*3/uL (ref 0.7–4.0)
LYMPHS PCT: 3 %
MCH: 28.5 pg (ref 26.0–34.0)
MCHC: 31.1 g/dL (ref 30.0–36.0)
MCV: 91.6 fL (ref 78.0–100.0)
Monocytes Absolute: 1.8 10*3/uL — ABNORMAL HIGH (ref 0.1–1.0)
Monocytes Relative: 7 %
NEUTROS ABS: 21.4 10*3/uL — AB (ref 1.7–7.7)
NEUTROS PCT: 89 %
Platelets: 250 10*3/uL (ref 150–400)
RBC: 4.07 MIL/uL (ref 3.87–5.11)
RDW: 14 % (ref 11.5–15.5)
WBC: 24.3 10*3/uL — AB (ref 4.0–10.5)

## 2017-12-20 LAB — PHOSPHORUS
Phosphorus: 7.7 mg/dL — ABNORMAL HIGH (ref 2.5–4.6)
Phosphorus: 3.8 mg/dL (ref 2.5–4.6)

## 2017-12-20 LAB — TROPONIN I
Troponin I: 0.03 ng/mL (ref <0.03)
Troponin I: <0.03 ng/mL (ref <0.03)

## 2017-12-20 LAB — TSH: TSH: 1.19 u[IU]/mL (ref 0.350–4.500)

## 2017-12-20 LAB — MAGNESIUM
Magnesium: 1.9 mg/dL (ref 1.7–2.4)
Magnesium: 3 mg/dL — ABNORMAL HIGH (ref 1.7–2.4)

## 2017-12-20 LAB — PROCALCITONIN: PROCALCITONIN: 0.21 ng/mL

## 2017-12-20 LAB — LACTIC ACID, PLASMA: LACTIC ACID, VENOUS: 1.3 mmol/L (ref 0.5–1.9)

## 2017-12-20 MED ORDER — SODIUM CHLORIDE 0.9 % IV BOLUS
500.0000 mL | Freq: Once | INTRAVENOUS | Status: AC
  Administered 2017-12-20: 500 mL via INTRAVENOUS

## 2017-12-20 MED ORDER — POTASSIUM CHLORIDE 10 MEQ/50ML IV SOLN
10.0000 meq | INTRAVENOUS | Status: AC
  Administered 2017-12-20 (×2): 10 meq via INTRAVENOUS
  Filled 2017-12-20 (×2): qty 50

## 2017-12-20 MED ORDER — MAGNESIUM SULFATE 2 GM/50ML IV SOLN
2.0000 g | Freq: Once | INTRAVENOUS | Status: AC
  Administered 2017-12-20: 2 g via INTRAVENOUS
  Filled 2017-12-20: qty 50

## 2017-12-20 MED ORDER — SODIUM CHLORIDE 0.9 % IV SOLN
1.0000 g | INTRAVENOUS | Status: DC
  Administered 2017-12-21: 1 g via INTRAVENOUS
  Filled 2017-12-20: qty 1

## 2017-12-20 MED ORDER — SODIUM CHLORIDE 0.9 % IV BOLUS
250.0000 mL | Freq: Once | INTRAVENOUS | Status: AC
  Administered 2017-12-20: 250 mL via INTRAVENOUS

## 2017-12-20 NOTE — Progress Notes (Signed)
ABG re-drawn. Abnormal results called to elink MD.  No changes to be made at this time per MD.

## 2017-12-20 NOTE — Progress Notes (Signed)
PCCM:  Notified of spontaneous subq air on the right chest and neck.  STAT CXR confirming right sided pneumothorax  Consent will be obtained and right chest tube to be placed  Garner Nash, DO [place] [facility] 12/20/2017 9:01 AM  Personal pager: [phone] If unanswered, please page CCM On-call: [phone]

## 2017-12-20 NOTE — Progress Notes (Signed)
CRITICAL VALUE ALERT  Critical Value:  Troponin 0.03  Date & Time Notied:  12/20/17 @ 3702  Provider Notified: eLink RN  Orders Received/Actions taken: No orders given.

## 2017-12-20 NOTE — Procedures (Addendum)
Chest Tube Insertion Procedure Note  Indications:  Clinically significant Pneumothorax  Pre-operative Diagnosis: Pneumothorax  Post-operative Diagnosis: Pneumothorax  Procedure Details  Informed consent was obtained for the procedure, including sedation.  Risks of lung perforation, hemorrhage, arrhythmia, and adverse drug reaction were discussed.   Prior to tube insertion bedside US was used to visualize the pneumothorax and confirm rib space insertion. A visible lung point sign was seen. The skin and rib space was marked in appropriate positioning along the anterior axillary line of the right chest.   After sterile skin prep, using standard technique, a 14 French tube was placed in the right anterior axillary line approximately 10th rib space.  Findings: Air return with finder needle. And approximately 10 ml of serosanguinous fluid obtained  Estimated Blood Loss:  Minimal, <1cc          Specimens:  None              Complications:  None; patient tolerated the procedure well.         Disposition: ICU - intubated and critically ill.         Condition: stable  Attending Attestation: I was present and scrubbed for the entire procedure.  STAT CXR pending   Garner Nash, DO [place] [facility] 12/20/2017 10:16 AM  Personal pager: [phone] If unanswered, please page CCM On-call: [phone]

## 2017-12-20 NOTE — Progress Notes (Signed)
Morning ABG drawn.  Abnormal results called to E-link MD.

## 2017-12-20 NOTE — Progress Notes (Signed)
Pharmacy Antibiotic Note  Tasha [name] is a 67 y.o. female admitted on 12/16/2017 with hypoxia.  Pharmacy has been consulted for cefepime dosing for pneumonia. Pt is afebrile but WBC is elevated at 24.3. SCr has also increased to 1.78.   Plan: Change cefepime to 1gm IV Q24H F/u renal fxn, C&S, clinical status and LOT  Height: _0  (154.9 cm) Weight: 146 lb 6.2 oz (66.4 kg) IBW/kg (Calculated) : 47.8  Temp (24hrs), Avg:96.9 F (36.1 C), Min:93.9 F (34.4 C), Max:98.8 F (37.1 C)  Recent Labs  Lab 12/16/17 1629 12/16/17 1924 12/17/17 0324 12/19/17 1301 12/20/17 0000 12/20/17 0107 12/20/17 0256  WBC 10.2  --  7.6  --   --   --  24.3*  CREATININE 0.66  --  0.71  --   --  1.72* 1.78*  LATICACIDVEN  --   --   --  2.8* 1.3  --   --   VANCOTROUGH  --  15  --   --   --   --   --     Estimated Creatinine Clearance: 26.7 mL/min (A) (by C-G formula based on SCr of 1.78 mg/dL (H)).    Allergies  Allergen Reactions  . Homatropine     Pt doesn't remember  . Hydromet [Hydrocodone-Homatropine]     Pt doesn't remember  . Amoxicillin Rash    Antimicrobials this admission: Cefepime 8/27>> Fluconazole po 8/25>stopped Vanco 8/24>>8/29 Rocephin 8/20>>stopped Azithro 8/20>>stopped  Microbiology results: 8/27 resp panel>>neg 8/27 MRSA PCR>>neg Urinary strep>>NEG Urinary legionella>>NEG 8/30 BAL - NGTD  [place] Cultures: 8/18 Sputum: heavy yeast 8/18: BC MRSE 1/2 8/18 BC x 2: negative Thank you for allowing pharmacy to be a part of this patient's care.  Jaiveon Suppes, Rande Lawman 12/20/2017 10:36 AM
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
## 2017-12-21 ENCOUNTER — Inpatient Hospital Stay (HOSPITAL_COMMUNITY): Payer: Medicare Other

## 2017-12-21 ENCOUNTER — Other Ambulatory Visit: Payer: Self-pay

## 2017-12-21 DIAGNOSIS — I959 Hypotension, unspecified: Secondary | ICD-10-CM

## 2017-12-21 DIAGNOSIS — J80 Acute respiratory distress syndrome: Secondary | ICD-10-CM

## 2017-12-21 DIAGNOSIS — Z9689 Presence of other specified functional implants: Secondary | ICD-10-CM

## 2017-12-21 DIAGNOSIS — T7029XA Other effects of high altitude, initial encounter: Secondary | ICD-10-CM

## 2017-12-21 DIAGNOSIS — Z9911 Dependence on respirator [ventilator] status: Secondary | ICD-10-CM

## 2017-12-21 DIAGNOSIS — I4891 Unspecified atrial fibrillation: Secondary | ICD-10-CM

## 2017-12-21 LAB — CBC WITH DIFFERENTIAL/PLATELET
Abs Immature Granulocytes: 0.1 10*3/uL (ref 0.0–0.1)
BASOS PCT: 0 %
Basophils Absolute: 0 10*3/uL (ref 0.0–0.1)
EOS ABS: 0 10*3/uL (ref 0.0–0.7)
Eosinophils Relative: 0 %
HEMATOCRIT: 30.6 % — AB (ref 36.0–46.0)
Hemoglobin: 9.6 g/dL — ABNORMAL LOW (ref 12.0–15.0)
IMMATURE GRANULOCYTES: 1 %
LYMPHS ABS: 0.5 10*3/uL — AB (ref 0.7–4.0)
Lymphocytes Relative: 5 %
MCH: 28.1 pg (ref 26.0–34.0)
MCHC: 31.4 g/dL (ref 30.0–36.0)
MCV: 89.5 fL (ref 78.0–100.0)
Monocytes Absolute: 0.7 10*3/uL (ref 0.1–1.0)
Monocytes Relative: 8 %
NEUTROS PCT: 86 %
Neutro Abs: 8.2 10*3/uL — ABNORMAL HIGH (ref 1.7–7.7)
Platelets: 154 10*3/uL (ref 150–400)
RBC: 3.42 MIL/uL — AB (ref 3.87–5.11)
RDW: 14 % (ref 11.5–15.5)
WBC: 9.5 10*3/uL (ref 4.0–10.5)

## 2017-12-21 LAB — BASIC METABOLIC PANEL
ANION GAP: 9 (ref 5–15)
ANION GAP: 6 (ref 5–15)
BUN: 55 mg/dL — AB (ref 8–23)
BUN: 61 mg/dL — ABNORMAL HIGH (ref 8–23)
CHLORIDE: 108 mmol/L (ref 98–111)
CHLORIDE: 113 mmol/L — AB (ref 98–111)
CO2: 28 mmol/L (ref 22–32)
CO2: 27 mmol/L (ref 22–32)
CREATININE: 1.42 mg/dL — AB (ref 0.44–1.00)
Calcium: 8.5 mg/dL — ABNORMAL LOW (ref 8.9–10.3)
Calcium: 8.4 mg/dL — ABNORMAL LOW (ref 8.9–10.3)
Creatinine, Ser: 1.47 mg/dL — ABNORMAL HIGH (ref 0.44–1.00)
GFR calc Af Amer: 41 mL/min — ABNORMAL LOW (ref >60)
GFR calc Af Amer: 43 mL/min — ABNORMAL LOW (ref >60)
GFR, EST NON AFRICAN AMERICAN: 36 mL/min — AB (ref >60)
GFR, EST NON AFRICAN AMERICAN: 37 mL/min — AB (ref >60)
GLUCOSE: 167 mg/dL — AB (ref 70–99)
Glucose, Bld: 192 mg/dL — ABNORMAL HIGH (ref 70–99)
POTASSIUM: 4.1 mmol/L (ref 3.5–5.1)
Potassium: 3.2 mmol/L — ABNORMAL LOW (ref 3.5–5.1)
Sodium: 145 mmol/L (ref 135–145)
Sodium: 146 mmol/L — ABNORMAL HIGH (ref 135–145)

## 2017-12-21 LAB — PHOSPHORUS
PHOSPHORUS: 2.5 mg/dL (ref 2.5–4.6)
Phosphorus: 3.5 mg/dL (ref 2.5–4.6)
Phosphorus: 3.8 mg/dL (ref 2.5–4.6)

## 2017-12-21 LAB — MAGNESIUM
MAGNESIUM: 2.9 mg/dL — AB (ref 1.7–2.4)
MAGNESIUM: 2.4 mg/dL (ref 1.7–2.4)
Magnesium: 2.9 mg/dL — ABNORMAL HIGH (ref 1.7–2.4)

## 2017-12-21 LAB — GLUCOSE, CAPILLARY
GLUCOSE-CAPILLARY: 162 mg/dL — AB (ref 70–99)
GLUCOSE-CAPILLARY: 165 mg/dL — AB (ref 70–99)
GLUCOSE-CAPILLARY: 214 mg/dL — AB (ref 70–99)
Glucose-Capillary: 186 mg/dL — ABNORMAL HIGH (ref 70–99)
Glucose-Capillary: 187 mg/dL — ABNORMAL HIGH (ref 70–99)
Glucose-Capillary: 167 mg/dL — ABNORMAL HIGH (ref 70–99)

## 2017-12-21 LAB — GLUCOSE 6 PHOSPHATE DEHYDROGENASE
G6PDH: 11.5 U/g{Hb} (ref 4.6–13.5)
HEMOGLOBIN: 11.6 g/dL (ref 11.1–15.9)

## 2017-12-21 LAB — TRIGLYCERIDES: Triglycerides: 165 mg/dL — ABNORMAL HIGH (ref <150)

## 2017-12-21 LAB — CK: CK TOTAL: 33 U/L — AB (ref 38–234)

## 2017-12-21 LAB — PROCALCITONIN: PROCALCITONIN: 0.12 ng/mL

## 2017-12-21 MED ORDER — POTASSIUM CHLORIDE 20 MEQ/15ML (10%) PO SOLN
40.0000 meq | Freq: Once | ORAL | Status: AC
  Administered 2017-12-21: 40 meq via ORAL
  Filled 2017-12-21: qty 30

## 2017-12-21 MED ORDER — WHITE PETROLATUM EX OINT
TOPICAL_OINTMENT | CUTANEOUS | Status: AC
  Administered 2017-12-21: 23:00:00
  Filled 2017-12-21: qty 28.35

## 2017-12-21 MED ORDER — METOPROLOL TARTRATE 25 MG/10 ML ORAL SUSPENSION
12.5000 mg | Freq: Three times a day (TID) | ORAL | Status: DC
Start: 2017-12-21 — End: 2017-12-22
  Administered 2017-12-21 – 2017-12-22 (×3): 12.5 mg via ORAL
  Filled 2017-12-21 (×3): qty 10

## 2017-12-21 MED ORDER — METOPROLOL TARTRATE 25 MG/10 ML ORAL SUSPENSION
12.5000 mg | Freq: Once | ORAL | Status: AC
  Administered 2017-12-21: 12.5 mg
  Filled 2017-12-21: qty 10

## 2017-12-21 MED ORDER — AMIODARONE HCL IN DEXTROSE 360-4.14 MG/200ML-% IV SOLN
30.0000 mg/h | INTRAVENOUS | Status: DC
  Administered 2017-12-21 – 2017-12-23 (×4): 30 mg/h via INTRAVENOUS
  Filled 2017-12-21 (×4): qty 200

## 2017-12-21 MED ORDER — SODIUM CHLORIDE 0.9% FLUSH
10.0000 mL | Freq: Two times a day (BID) | INTRAVENOUS | Status: DC
  Administered 2017-12-21 – 2017-12-28 (×8): 10 mL

## 2017-12-21 MED ORDER — ADULT MULTIVITAMIN LIQUID CH
15.0000 mL | Freq: Every day | ORAL | Status: DC
  Administered 2017-12-21 – 2017-12-25 (×5): 15 mL via ORAL
  Filled 2017-12-21 (×5): qty 15

## 2017-12-21 MED ORDER — METOPROLOL TARTRATE 5 MG/5ML IV SOLN
5.0000 mg | Freq: Once | INTRAVENOUS | Status: AC
  Administered 2017-12-21: 5 mg via INTRAVENOUS

## 2017-12-21 MED ORDER — SODIUM CHLORIDE 0.9 % IV BOLUS
500.0000 mL | Freq: Once | INTRAVENOUS | Status: AC
  Administered 2017-12-21: 500 mL via INTRAVENOUS

## 2017-12-21 MED ORDER — ADENOSINE 6 MG/2ML IV SOLN
INTRAVENOUS | Status: AC
  Filled 2017-12-21: qty 2

## 2017-12-21 MED ORDER — METOPROLOL TARTRATE 25 MG/10 ML ORAL SUSPENSION: 12.5000 mg | Freq: Three times a day (TID) | ORAL | Status: DC

## 2017-12-21 MED ORDER — CHLORHEXIDINE GLUCONATE CLOTH 2 % EX PADS
6.0000 | MEDICATED_PAD | Freq: Every day | CUTANEOUS | Status: DC
  Administered 2017-12-21 – 2017-12-29 (×8): 6 via TOPICAL

## 2017-12-21 MED ORDER — METOPROLOL TARTRATE 5 MG/5ML IV SOLN
INTRAVENOUS | Status: AC
  Administered 2017-12-21: 5 mg
  Filled 2017-12-21: qty 10

## 2017-12-21 MED ORDER — FAMOTIDINE 40 MG/5ML PO SUSR
10.0000 mg | Freq: Every day | ORAL | Status: DC
  Administered 2017-12-21 – 2017-12-22 (×2): 10.4 mg via ORAL
  Filled 2017-12-21 (×2): qty 2.5

## 2017-12-21 MED ORDER — AMIODARONE HCL IN DEXTROSE 360-4.14 MG/200ML-% IV SOLN
60.0000 mg/h | INTRAVENOUS | Status: AC
  Administered 2017-12-21 (×2): 60 mg/h via INTRAVENOUS
  Filled 2017-12-21 (×2): qty 200

## 2017-12-21 MED ORDER — FUROSEMIDE 10 MG/ML IJ SOLN
40.0000 mg | Freq: Four times a day (QID) | INTRAMUSCULAR | Status: AC
  Administered 2017-12-21 (×2): 40 mg via INTRAVENOUS
  Filled 2017-12-21 (×2): qty 4

## 2017-12-21 MED ORDER — PHENYLEPHRINE HCL-NACL 10-0.9 MG/250ML-% IV SOLN
0.0000 ug/min | INTRAVENOUS | Status: DC
  Administered 2017-12-21: 50 ug/min via INTRAVENOUS
  Filled 2017-12-21: qty 250

## 2017-12-21 MED ORDER — DEXMEDETOMIDINE HCL IN NACL 400 MCG/100ML IV SOLN
0.4000 ug/kg/h | INTRAVENOUS | Status: DC
  Administered 2017-12-21: 0.4 ug/kg/h via INTRAVENOUS
  Administered 2017-12-22: 0.2 ug/kg/h via INTRAVENOUS
  Administered 2017-12-22: 0.4 ug/kg/h via INTRAVENOUS
  Administered 2017-12-24: 0.2 ug/kg/h via INTRAVENOUS
  Administered 2017-12-24: 0.4 ug/kg/h via INTRAVENOUS
  Filled 2017-12-21 (×5): qty 100

## 2017-12-21 MED ORDER — METHYLPREDNISOLONE SODIUM SUCC 125 MG IJ SOLR
60.0000 mg | Freq: Four times a day (QID) | INTRAMUSCULAR | Status: DC
  Administered 2017-12-21: 60 mg via INTRAVENOUS
  Administered 2017-12-21: 125 mg via INTRAVENOUS
  Administered 2017-12-21 – 2017-12-22 (×3): 60 mg via INTRAVENOUS
  Filled 2017-12-21 (×5): qty 2

## 2017-12-21 MED ORDER — SODIUM CHLORIDE 0.9% FLUSH: 10.0000 mL | INTRAVENOUS | Status: DC | PRN

## 2017-12-21 MED ORDER — POTASSIUM CHLORIDE 20 MEQ/15ML (10%) PO SOLN
60.0000 meq | Freq: Once | ORAL | Status: AC
  Administered 2017-12-21: 60 meq via ORAL
  Filled 2017-12-21: qty 45

## 2017-12-21 MED ORDER — ESMOLOL HCL-SODIUM CHLORIDE 2000 MG/100ML IV SOLN
25.0000 ug/kg/min | INTRAVENOUS | Status: DC
  Administered 2017-12-21: 25 ug/kg/min via INTRAVENOUS
  Filled 2017-12-21: qty 100

## 2017-12-21 MED ORDER — AMIODARONE LOAD VIA INFUSION
150.0000 mg | Freq: Once | INTRAVENOUS | Status: AC
  Administered 2017-12-21: 150 mg via INTRAVENOUS
  Filled 2017-12-21: qty 83.34

## 2017-12-21 MED ORDER — VITAMIN B-1 100 MG PO TABS
100.0000 mg | ORAL_TABLET | Freq: Every day | ORAL | Status: DC
  Administered 2017-12-21 – 2017-12-25 (×5): 100 mg via ORAL
  Filled 2017-12-21 (×5): qty 1

## 2017-12-21 NOTE — Progress Notes (Addendum)
PCCM:  Patient seen and examined again at bedside. Worsening HR and hypotension. HR refractory to BB(-) infusion.   Afib RVR with hypotension  Heart rate unresponsive to IV BB(-), esmolol  Will add amiodarone  On low dose 36mcg Neo to support pressure at this time  Adding precedex Avoiding propofol for worsening hypotension Wean fent ggt if possible Discussed with nursing  Up titrate to 25mg  metoprolol oral Q8H  May need to consider DC cardioversion.   Family updated in the conference room. Care plan discussed with nursing as well as pharmacy.   This patient is critically ill with multiple organ system failure; which, requires frequent high complexity decision making, assessment, support, evaluation, and titration of therapies. This was completed through the application of advanced monitoring technologies and extensive interpretation of multiple databases. During this encounter critical care time was devoted to patient care services described in this note for 25 minutes.   Garner Nash, DO [facility] 12/21/2017 11:33 AM

## 2017-12-21 NOTE — Progress Notes (Signed)
Pt appeared to be in SVT, attempted vagal maneuver by suctioning with no result. CCM notified. 10mg  of IV metoprolol given. EKG at bedside indicated afib with RVR. CCM recommended esmolol drip and neo to maintain pressure. Will continue to monitor closely. Clint Bolder, RN 12/21/17 9:50AM

## 2017-12-21 NOTE — Plan of Care (Signed)
  Problem: Clinical Measurements: Goal: Will remain free from infection Outcome: Progressing Goal: Diagnostic test results will improve Outcome: Progressing Goal: Respiratory complications will improve Outcome: Progressing   Problem: Nutrition: Goal: Adequate nutrition will be maintained Outcome: Progressing Note:  Tube feeds   Problem: Elimination: Goal: Will not experience complications related to bowel motility Outcome: Progressing Goal: Will not experience complications related to urinary retention Outcome: Progressing Note:  Foley in place

## 2017-12-21 NOTE — Progress Notes (Signed)
Aspynn Clover  [id number]:[id number] DOB: September 22, 1950 DOA: 12/16/2017 PCP: Dettinger, Fransisca Kaufmann, MD    LOS: 5 days   Reason for Consult / Chief Complaint:  Acute Hypoxic respiratory failure   Consulting MD and date:  Yates   HPI/Summary of hospital stay:  67 year old female patient admitted to [facility] 8/27 w/acute hypoxic respiratory failure in setting of bilateral pulmonary infiltrates, working diagnosis of subacute ILD versus organizing pneumonia felt possibly secondary to inhalation injury from antifreeze leak  in her car (initially seen and treated w/ Doxy 8/12; seen again & switched to levaquin 8/16 & admitted 8/18 to [facility] treated w/ broad spec abx as well as antifungals). [facility] on 8/27: Pulm consulted and  Autoimmune panel,  hypersensitivity pneumonitis panel, strep and Legionella antigen sent. 8/28: CT chest showing extensive consolidative patchy airspace disease with groundglass attenuation and mild bronchiectasis throughout both bases.  No honeycombing.  Had elevated BNP, versus diuresis.  TTE normal with exception of mild pulmonary hypertension.  Treated with broad-spectrum antibiotics.  Transition to high flow oxygen. 8/29: ID work-up negative, no fever or white cell count.  Respiratory virus, urine strep and Legionella negative. Sed rate elevated desaturating but of high flow oxygen down to the 70s.  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  Antibiotics stopped.  High-dose steroids initiated (250 mg q 6).  8/30: Rapid response called overnight.  Received  extra Lasix.  Transitioned to noninvasive positive pressure ventilation.  Moving her to the intensive care given increased WOB, intubated and bronched  8/31: PTX on vent, Right chest drain placed  9/1: steroid taper started, dropped to 60mg  Q6h solumedrol. Called to the bedside mid-morning rounding. Patient in rapid SVT... Given metoprolol to slow down. ECG +Afib  RVR  Subjective:  Calm this morning on vent. Opens eyes to voice. Family updated at bedside   Called back mid morning for rapid SVT.  Patient given 2 doses of IV metoprolol.  Heart rate responded into the 130s down from 160s 170s.  ECG was obtained which revealed new onset Afib RVR.  Pressure was slightly soft and given 500 cc bolus of fluid.  Objective   Blood pressure (!) 110/55, pulse (!) 55, temperature (!) 96.6 F (35.9 C), temperature source Bladder, resp. rate (!) 32, height 5\' 1"  (1.549 m), weight 66.2 kg, SpO2 91 %. CVP:  [6 mmHg-11 mmHg] 11 mmHg  Vent Mode: PRVC FiO2 (%):  [40 %] 40 % Set Rate:  [32 bmp] 32 bmp Vt Set:  [310 mL] 310 mL PEEP:  [5 cmH20-8 cmH20] 5 cmH20 Plateau Pressure:  [23 cmH20-31 cmH20] 25 cmH20   Intake/Output Summary (Last 24 hours) at 12/21/2017 0851 Last data filed at 12/21/2017 0800 Gross per 24 hour  Intake 1784.26 ml  Output 835 ml  Net 949.26 ml   Filed Weights   12/17/17 0500 12/20/17 0431 12/21/17 0500  Weight: 70.6 kg 66.4 kg 66.2 kg   Examination: General appearance: 67 y.o., female, intubated, sedated on mechanical ventilation, appears comfortable Eyes: anicteric sclerae, moist conjunctivae; trying to track.  Will open eyes to voice HENT: NCAT; oropharynx, MMM, ET tube in place Neck: Trachea midline; no JVD Chest: Palpable subcutaneous emphysema along the anterior chest and neck, right chest tube in place Lungs: Diminished breath sounds bilaterally, ventilated breath sounds bilaterally, CV: RRR, S1, S2, no MRGs, distant heart tones Abdomen: Soft, non-tender; non-distended, BS present  Extremities: No significant edema, does have a little bit of dependent  edema in the posterior flanks and posterior upper extremities. Skin: Normal temperature, turgor and texture; no rash, no petechia Psych: Unable to fully assess, no evidence of clear active delirium Neuro: Alert and oriented to person and place, no focal deficit   Consults: date of  consult/date signed off & final recs:  Pulmonary consulted on 8/27  Procedures: 8/31 - Right pigtail catheter, chest for PTx   Significant Diagnostic Tests: 8/29 TEE: EF 65-70% with no wall motion abnormality grade 1 diastolic dysfunction 4/09: High-resolution CT chest showing bilateral lower lobe consolidation with groundglass attenuation  Auto-immune labs on 8/27 and 8/28:  ACE level less than 15  rheumatoid factor normal at 10.6.   Sed rate elevated at 69.  Procalcitonin less than 0.10. ANA: Positive Anti-DNA antibody negative Sojourner's a and B antibodies both negative CCP antibodies negative Anti-scleroderma antibody negative MPO/PR-3: neg Respiratory viral panel: Negative FANA staining: Elevated 1: 320 CRP elevated at 6.9 Hypersensitivity pneumonitis panel still pending>>> Histone Ab >>> Pending   Micro Data: Urine strep 8/28- Urine Legionella 8/28-  Antimicrobials:  Received full course of therapy at outside hospital with initially Rocephin and azithromycin, later changed to Zosyn and a azithromycin.   Cefepime 8/27 to 8/28 Vancomycin 8/27 to 8/28.  Resolved Hospital Problem list    Assessment & Plan:   Acute hypoxic respiratory failure in the setting of diffuse consolidative pulmonary infiltrates predominantly in the bases.  Working diagnosis of BOOP/COP Possibly due to inhalation injury from antifreeze or coolant per family, seems unlikely. Idiopathic ARDS Respiratory acidosis Right-sided pneumothorax, subcutaneous emphysema likely secondary to barotrauma and poor lung compliance in the setting of ARDS. - ANA+ 1:320 Speckled  - Resp viral panel negative  - Systemic steroid pulse initiated 8/29, taper started 9/1 - ACE inhibitor discontinued 8/29 - Antihistone antibodies pending  New onset atrial fibrillation with RVR associated hypotension and hemodynamic compromise.  Hypertension Hyperlipidemia Mild anemia Hypokalemia AKI Hyperglycemia secondary to  stress induced/steroid-induced Hypomagnesemia Hypotension, shock likely secondary to medication effect, sedation and paralysis Leukocytosis  Plan:   Patient remains intubated on mechanical ventilation, low tidal volume ventilation in the setting of ARDS and barotrauma, sedation slowly being lifted, paralysis stopped yesterday Was weaned off of norepinephrine this morning.  Patient developed atrial ablation with RVR was given 2 doses of IV metoprolol and started on esmolol drip.  Due to pharmacy shortage with esmolol will also uptitrate oral doses of metoprolol for rate control.  If this fails will consider amiodarone however would like to avoid this.  Due to the interim hypotension associated with rapid rate as well as sedation needs patient was given 500 cc bolus of fluid.  May need to support pressure with IV phenylephrine.  Would prefer to avoid beta agonist in this situation such as norepinephrine.  Would like to avoid cardioversion but may be necessary if hemodynamic instability continues.  Titrate esmolol to heart rate less than 120.  Reduced steroid dosing today to 60 mg every 6 hours Continue SSI for glucose coverage secondary to above  Continue PAD guidelines with PRN Versed fentanyl, propofol  Discontinued cefepime no growth on cultures  Right-sided chest tube placement with chest tube to -20 cm of water  I have ordered antihistone antibodies due to speckled ANA pattern.  Disposition / Summary of Today's Plan 12/21/17   Remains intubated, sedated, paralyzed on mechanical ventilation.  Best Practice / Goals of Care / Disposition.   DVT prophylaxis: scd, Lovenox GI prophylaxis: PPI Diet: NPO Mobility:BR Code Status: full  code Family Communication: Discussed with son and husband at bedside.  Son was able to translate for her husband as he speaks minimal [place].  Labs   CBC: Recent Labs  Lab 12/16/17 1629 12/17/17 0324 12/19/17 0202 12/20/17 0256 12/21/17 0254  WBC  10.2 7.6  --  24.3* 9.5  NEUTROABS  --   --   --  21.4* 8.2*  HGB 10.4* 9.2* 11.6 11.6* 9.6*  HCT 32.5* 29.4*  --  37.3 30.6*  MCV 88.6 89.4  --  91.6 89.5  PLT 124* 113*  --  250 818   Basic Metabolic Panel: Recent Labs  Lab 12/16/17 1629 12/17/17 0324 12/19/17 1800 12/20/17 0107 12/20/17 0256 12/20/17 1700 12/21/17 0254  NA 138 138  --  144 145  --   --   K 4.1 4.6  --  3.4* 3.4*  --   --   CL 105 107  --  104 105  --   --   CO2 25 24  --  26 30  --   --   GLUCOSE 98 103*  --  173* 176*  --   --   BUN 7* 7*  --  26* 26*  --   --   CREATININE 0.66 0.71  --  1.72* 1.78*  --   --   CALCIUM 8.4* 8.1*  --  8.3* 8.4*  --   --   MG  --   --  1.7  --  1.9 3.0* 2.9*  PHOS  --   --  6.6*  --  7.7* 3.8 3.5   GFR: Estimated Creatinine Clearance: 26.7 mL/min (A) (by C-G formula based on SCr of 1.78 mg/dL (H)). Recent Labs  Lab 12/16/17 1629 12/17/17 0324 12/19/17 1301 12/19/17 1526 12/20/17 0000 12/20/17 0256 12/21/17 0254  PROCALCITON <0.10  --   --  <0.10  --  0.21 0.12  WBC 10.2 7.6  --   --   --  24.3* 9.5  LATICACIDVEN  --   --  2.8*  --  1.3  --   --    Liver Function Tests: Recent Labs  Lab 12/17/17 0324 12/20/17 0256  AST 30 19  ALT 17 21  ALKPHOS 43 57  BILITOT 1.3* 0.5  PROT 4.8* 6.6  ALBUMIN 2.1* 2.9*   No results for input(s): LIPASE, AMYLASE in the last 168 hours. No results for input(s): AMMONIA in the last 168 hours. ABG    Component Value Date/Time   PHART 7.343 (L) 12/20/2017 1417   PCO2ART 49.6 (H) 12/20/2017 1417   PO2ART 104.0 12/20/2017 1417   HCO3 26.9 12/20/2017 1417   TCO2 28 12/20/2017 1417   O2SAT 98.0 12/20/2017 1417    Coagulation Profile: No results for input(s): INR, PROTIME in the last 168 hours. Cardiac Enzymes: Recent Labs  Lab 12/19/17 1526 12/19/17 1800 12/20/17 0107 12/20/17 1319  TROPONINI <0.03 <0.03 0.03* <0.03   HbA1C: No results found for: HGBA1C CBG: Recent Labs  Lab 12/20/17 1617 12/20/17 2007  12/21/17 0001 12/21/17 0407 12/21/17 0810  GLUCAP 139* 156* 162* 186* 165*    ANA+ 1:320, Speckled  This patient is critically ill with multiple organ system failure; which, requires frequent high complexity decision making, assessment, support, evaluation, and titration of therapies. This was completed through the application of advanced monitoring technologies and extensive interpretation of multiple databases. During this encounter critical care time was devoted to patient care services described in this note for 50 minutes.   Leory Plowman  Marvel Plan, DO [facility] 12/21/2017 8:51 AM  Personal pager: [phone] If unanswered, please page CCM On-call: [phone]

## 2017-12-22 LAB — BASIC METABOLIC PANEL
ANION GAP: 8 (ref 5–15)
Anion gap: 9 (ref 5–15)
BUN: 72 mg/dL — ABNORMAL HIGH (ref 8–23)
BUN: 71 mg/dL — ABNORMAL HIGH (ref 8–23)
CHLORIDE: 110 mmol/L (ref 98–111)
CO2: 27 mmol/L (ref 22–32)
CO2: 29 mmol/L (ref 22–32)
Calcium: 8.6 mg/dL — ABNORMAL LOW (ref 8.9–10.3)
Calcium: 8.7 mg/dL — ABNORMAL LOW (ref 8.9–10.3)
Chloride: 113 mmol/L — ABNORMAL HIGH (ref 98–111)
Creatinine, Ser: 1.4 mg/dL — ABNORMAL HIGH (ref 0.44–1.00)
Creatinine, Ser: 1.24 mg/dL — ABNORMAL HIGH (ref 0.44–1.00)
GFR calc Af Amer: 44 mL/min — ABNORMAL LOW (ref >60)
GFR calc non Af Amer: 38 mL/min — ABNORMAL LOW (ref >60)
GFR, EST AFRICAN AMERICAN: 51 mL/min — AB (ref >60)
GFR, EST NON AFRICAN AMERICAN: 44 mL/min — AB (ref >60)
GLUCOSE: 183 mg/dL — AB (ref 70–99)
Glucose, Bld: 175 mg/dL — ABNORMAL HIGH (ref 70–99)
POTASSIUM: 4.1 mmol/L (ref 3.5–5.1)
POTASSIUM: 3 mmol/L — AB (ref 3.5–5.1)
SODIUM: 148 mmol/L — AB (ref 135–145)
Sodium: 148 mmol/L — ABNORMAL HIGH (ref 135–145)

## 2017-12-22 LAB — CBC WITH DIFFERENTIAL/PLATELET
Abs Immature Granulocytes: 0.1 10*3/uL (ref 0.0–0.1)
Basophils Absolute: 0 10*3/uL (ref 0.0–0.1)
Basophils Relative: 0 %
EOS ABS: 0 10*3/uL (ref 0.0–0.7)
Eosinophils Relative: 0 %
HEMATOCRIT: 34.9 % — AB (ref 36.0–46.0)
HEMOGLOBIN: 11 g/dL — AB (ref 12.0–15.0)
IMMATURE GRANULOCYTES: 1 %
LYMPHS PCT: 6 %
Lymphs Abs: 0.4 10*3/uL — ABNORMAL LOW (ref 0.7–4.0)
MCH: 27.9 pg (ref 26.0–34.0)
MCHC: 31.5 g/dL (ref 30.0–36.0)
MCV: 88.6 fL (ref 78.0–100.0)
MONOS PCT: 5 %
Monocytes Absolute: 0.4 10*3/uL (ref 0.1–1.0)
NEUTROS PCT: 88 %
Neutro Abs: 6.5 10*3/uL (ref 1.7–7.7)
Platelets: 110 10*3/uL — ABNORMAL LOW (ref 150–400)
RBC: 3.94 MIL/uL (ref 3.87–5.11)
RDW: 14.7 % (ref 11.5–15.5)
WBC: 7.5 10*3/uL (ref 4.0–10.5)

## 2017-12-22 LAB — MAGNESIUM
MAGNESIUM: 2.3 mg/dL (ref 1.7–2.4)
Magnesium: 2.6 mg/dL — ABNORMAL HIGH (ref 1.7–2.4)

## 2017-12-22 LAB — GLUCOSE, CAPILLARY
GLUCOSE-CAPILLARY: 154 mg/dL — AB (ref 70–99)
Glucose-Capillary: 153 mg/dL — ABNORMAL HIGH (ref 70–99)
Glucose-Capillary: 154 mg/dL — ABNORMAL HIGH (ref 70–99)
Glucose-Capillary: 164 mg/dL — ABNORMAL HIGH (ref 70–99)
Glucose-Capillary: 177 mg/dL — ABNORMAL HIGH (ref 70–99)
Glucose-Capillary: 191 mg/dL — ABNORMAL HIGH (ref 70–99)
Glucose-Capillary: 203 mg/dL — ABNORMAL HIGH (ref 70–99)

## 2017-12-22 LAB — CULTURE, BAL-QUANTITATIVE W GRAM STAIN: Gram Stain: NONE SEEN

## 2017-12-22 LAB — PHOSPHORUS
PHOSPHORUS: 2.3 mg/dL — AB (ref 2.5–4.6)
Phosphorus: 2.2 mg/dL — ABNORMAL LOW (ref 2.5–4.6)

## 2017-12-22 LAB — CULTURE, BAL-QUANTITATIVE: CULTURE: NO GROWTH

## 2017-12-22 MED ORDER — METHYLPREDNISOLONE SODIUM SUCC 40 MG IJ SOLR
40.0000 mg | Freq: Three times a day (TID) | INTRAMUSCULAR | Status: DC
  Administered 2017-12-22 – 2017-12-26 (×12): 40 mg via INTRAVENOUS
  Filled 2017-12-22 (×12): qty 1

## 2017-12-22 MED ORDER — POTASSIUM CHLORIDE 10 MEQ/100ML IV SOLN
10.0000 meq | INTRAVENOUS | Status: AC
  Administered 2017-12-22 (×4): 10 meq via INTRAVENOUS
  Filled 2017-12-22 (×4): qty 100

## 2017-12-22 MED ORDER — METOPROLOL TARTRATE 25 MG/10 ML ORAL SUSPENSION
25.0000 mg | Freq: Three times a day (TID) | ORAL | Status: DC
  Administered 2017-12-22 – 2017-12-24 (×7): 25 mg via ORAL
  Filled 2017-12-22 (×7): qty 10

## 2017-12-22 MED ORDER — HYDRALAZINE HCL 20 MG/ML IJ SOLN
10.0000 mg | INTRAMUSCULAR | Status: DC | PRN
  Administered 2017-12-22 – 2017-12-25 (×5): 10 mg via INTRAVENOUS
  Filled 2017-12-22 (×6): qty 1

## 2017-12-22 MED ORDER — FREE WATER
200.0000 mL | Freq: Three times a day (TID) | Status: DC
  Administered 2017-12-22 – 2017-12-25 (×9): 200 mL

## 2017-12-22 MED ORDER — POTASSIUM CHLORIDE 20 MEQ/15ML (10%) PO SOLN
60.0000 meq | Freq: Every day | ORAL | Status: DC
  Administered 2017-12-22: 60 meq via ORAL
  Filled 2017-12-22: qty 45

## 2017-12-22 MED ORDER — FUROSEMIDE 10 MG/ML IJ SOLN
40.0000 mg | Freq: Three times a day (TID) | INTRAMUSCULAR | Status: AC
  Administered 2017-12-22 – 2017-12-23 (×3): 40 mg via INTRAVENOUS
  Filled 2017-12-22 (×3): qty 4

## 2017-12-22 NOTE — Progress Notes (Signed)
eLink Physician-Brief Progress Note Patient Name: Nixie Laube DOB: 09/29/1950 MRN: [id number]   Date of Service  12/22/2017  HPI/Events of Note  Hypertension - BP = 165/60.   eICU Interventions  Will order: 1. Hydralazie 10 mg IV Q 4 hours PRN SBP > 170 or DBP > 100.     Intervention Category Major Interventions: Hypertension - evaluation and management  Micheal Murad Eugene 12/22/2017, 6:18 AM

## 2017-12-22 NOTE — Progress Notes (Signed)
Tasha [name]  AYT:[id number] DOB: 1951-03-30 DOA: 12/16/2017 PCP: Dettinger, Fransisca Kaufmann, MD    LOS: 6 days   Reason for Consult / Chief Complaint:  Acute Hypoxic respiratory failure   Consulting MD and date:  Yates   HPI/Summary of hospital stay:  67 year old female patient admitted to [facility] 8/27 w/acute hypoxic respiratory failure in setting of bilateral pulmonary infiltrates, working diagnosis of subacute ILD versus organizing pneumonia felt possibly secondary to inhalation injury from antifreeze leak  in her car (initially seen and treated w/ Doxy 8/12; seen again & switched to levaquin 8/16 & admitted 8/18 to [facility] treated w/ broad spec abx as well as antifungals). [facility] on 8/27: Pulm consulted and  Autoimmune panel,  hypersensitivity pneumonitis panel, strep and Legionella antigen sent. 8/28: CT chest showing extensive consolidative patchy airspace disease with groundglass attenuation and mild bronchiectasis throughout both bases.  No honeycombing.  Had elevated BNP, versus diuresis.  TTE normal with exception of mild pulmonary hypertension.  Treated with broad-spectrum antibiotics.  Transition to high flow oxygen. 8/29: ID work-up negative, no fever or white cell count.  Respiratory virus, urine strep and Legionella negative. Sed rate elevated desaturating but of high flow oxygen down to the 70s.  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  Antibiotics stopped.  High-dose steroids initiated (250 mg q 6).  8/30: Rapid response called overnight.  Received  extra Lasix.  Transitioned to noninvasive positive pressure ventilation.  Moving her to the intensive care given increased WOB, intubated and bronched  8/31: PTX on vent, Right chest drain placed  9/1: steroid taper started, dropped to 60mg  Q6h solumedrol. Called to the bedside mid-morning rounding. Patient in rapid SVT... Given metoprolol to slow down. ECG +Afib  RVR  Subjective:  Doing much better this morning.  Family at bedside.  Awake, on ventilator, light sedation able to follow commands.  Oxygenation much improved.  No longer requiring vasopressors.  Amiodarone drip, heart rate rate controlled.  Objective   Blood pressure (!) 158/68, pulse (!) 113, temperature 98.1 F (36.7 C), resp. rate (!) 30, height 5\' 1"  (1.549 m), weight 67.5 kg, SpO2 100 %. CVP:  [4 mmHg-8 mmHg] 7 mmHg  Vent Mode: PRVC FiO2 (%):  [40 %] 40 % Set Rate:  [28 bmp] 28 bmp Vt Set:  [340 mL] 340 mL PEEP:  [5 cmH20] 5 cmH20 Pressure Support:  [15 cmH20] 15 cmH20 Plateau Pressure:  [17 cmH20-28 cmH20] 28 cmH20   Intake/Output Summary (Last 24 hours) at 12/22/2017 1205 Last data filed at 12/22/2017 1200 Gross per 24 hour  Intake 2367.62 ml  Output 2690.2 ml  Net -322.58 ml   Filed Weights   12/20/17 0431 12/21/17 0500 12/22/17 0500  Weight: 66.4 kg 66.2 kg 67.5 kg   Examination: General appearance: 67 y.o., female, intubated on mechanical ventilation, comfortable able to follow commands, give thumbs up this morning. Eyes: anicteric sclerae, moist conjunctivae; pupils equal, tracking appropriately HENT: NCAT; oropharynx, MMM, ET tube in place Neck: Trachea midline; no JVD Lungs: Diminished bilaterally, palpable subcu emphysema improved, no crackles, no wheeze CV: RRR, S1, S2, no MRGs  Abdomen: Soft, non-tender; non-distended, BS present  Extremities: No peripheral edema or radial and DP pulses present bilaterally  Skin: Normal temperature, turgor and texture; no rash Psych: Unable to fully assess, no evidence of axillary Neuro: Alert to voice, moving all 4 extremities spontaneously    Consults: date of consult/date signed off & final recs:  Pulmonary consulted on 8/27  Procedures: 8/31 - Right pigtail catheter, chest for PTx   Significant Diagnostic Tests: 8/29 TEE: EF 65-70% with no wall motion abnormality grade 1 diastolic dysfunction 9/51: High-resolution  CT chest showing bilateral lower lobe consolidation with groundglass attenuation  Auto-immune labs on 8/27 and 8/28:  ACE level less than 15  rheumatoid factor normal at 10.6.   Sed rate elevated at 69.  Procalcitonin less than 0.10. ANA: Positive Anti-DNA antibody negative Sojourner's a and B antibodies both negative CCP antibodies negative Anti-scleroderma antibody negative MPO/PR-3: neg Respiratory viral panel: Negative FANA staining: Elevated 1: 320 CRP elevated at 6.9 Hypersensitivity pneumonitis panel still pending>>> Histone Ab >>> Pending   Micro Data: Urine strep 8/28- Urine Legionella 8/28-  Antimicrobials:  Received full course of therapy at outside hospital with initially Rocephin and azithromycin, later changed to Zosyn and a azithromycin.   Cefepime 8/27 to 8/28 Vancomycin 8/27 to 8/28.  Microbial stopped at this time  Resolved Hospital Problem list    Assessment & Plan:   Acute hypoxic respiratory failure in the setting of diffuse consolidative pulmonary infiltrates predominantly in the bases.  Working diagnosis of BOOP/COP Possibly due to inhalation injury from antifreeze or coolant per family, seems unlikely. Idiopathic ARDS Respiratory acidosis Right-sided pneumothorax, subcutaneous emphysema likely secondary to barotrauma and poor lung compliance in the setting of ARDS. - ANA+ 1:320 Speckled  - Resp viral panel negative  - Systemic steroid pulse initiated 8/29, taper started 9/1 - ACE inhibitor discontinued 8/29 - Antihistone antibodies pending  New onset atrial fibrillation with RVR associated hypotension and hemodynamic compromise,, resolved with rate control on amiodarone.  Positive cumulative fluid balance, +4 L  Hypertension Hyperlipidemia Mild anemia Hypokalemia resolved AKI, resolved Hyperglycemia secondary to stress induced/steroid-induced Hypomagnesemia, resolved Hypotension, shock likely secondary to medication effect, sedation and  paralysis, resolved Leukocytosis, secondary to stress and steroids  Plan:   Patient remains intubated on mechanical ventilation, low tidal volume ventilation the setting of ARDS and barotrauma with subcutaneous air and pneumothorax.  Sedation has slowly been lifted and currently comfortable on Precedex and fentanyl.  We have been able to wean off of the norepinephrine and her blood pressures have been stable actually been hypertensive overnight.  We will continue PRN blood pressure management.  Patient is still completing amiodarone loading infusion for control of atrial relation with RVR.  Rate controlled overnight no issues.  If it reoccurs or persistent will need to consider anticoagulation.  However I suspect was stimulated due to underlying respiratory disease.  Will wean IV steroids again today to 40 mg every 8 hours Continue SSI for glucose coverage and CBGs every 4 secondary to above  Discontinue all paralysis orders Discontinue vasopressor orders  Continue PAD guidelines sedation with fentanyl and Precedex Continue right-sided chest tube to -27 m of water  Histone antibodies pending  We will give additional doses of diuresis, 40 mg Lasix every 8 hours x3 doses Uptitrate oral metoprolol to 25 mg every 8 As needed hydralazine for systolic blood pressure greater than 884 diastolic blood pressure greater than 100.  Disposition / Summary of Today's Plan 12/22/17   Attempt SBT again this afternoon Diuresis  Best Practice / Goals of Care / Disposition.   DVT prophylaxis: scd, Lovenox GI prophylaxis: PPI Diet: NPO Mobility:BR Code Status: full code Family Communication: Discussed with son and husband at bedside.  Son was able to translate for her husband as he speaks minimal [place].  Labs   CBC: Recent Labs  Lab 12/16/17 1629 12/17/17 0324 12/19/17 0202 12/20/17 0256 12/21/17 0254 12/22/17 0500  WBC 10.2 7.6  --  24.3* 9.5 7.5  NEUTROABS  --   --   --  21.4* 8.2*  6.5  HGB 10.4* 9.2* 11.6 11.6* 9.6* 11.0*  HCT 32.5* 29.4*  --  37.3 30.6* 34.9*  MCV 88.6 89.4  --  91.6 89.5 88.6  PLT 124* 113*  --  250 154 500*   Basic Metabolic Panel: Recent Labs  Lab 12/20/17 0107 12/20/17 0256 12/20/17 1700 12/21/17 0254 12/21/17 0917 12/21/17 1746 12/22/17 0500  NA 144 145  --   --  145 146* 148*  K 3.4* 3.4*  --   --  3.2* 4.1 4.1  CL 104 105  --   --  108 113* 113*  CO2 26 30  --   --  28 27 27   GLUCOSE 173* 176*  --   --  167* 192* 183*  BUN 26* 26*  --   --  55* 61* 72*  CREATININE 1.72* 1.78*  --   --  1.47* 1.42* 1.40*  CALCIUM 8.3* 8.4*  --   --  8.5* 8.4* 8.6*  MG  --  1.9 3.0* 2.9* 2.9* 2.4 2.6*  PHOS  --  7.7* 3.8 3.5 3.8 2.5 2.3*   GFR: Estimated Creatinine Clearance: 34.3 mL/min (A) (by C-G formula based on SCr of 1.4 mg/dL (H)). Recent Labs  Lab 12/16/17 1629 12/17/17 0324 12/19/17 1301 12/19/17 1526 12/20/17 0000 12/20/17 0256 12/21/17 0254 12/22/17 0500  PROCALCITON <0.10  --   --  <0.10  --  0.21 0.12  --   WBC 10.2 7.6  --   --   --  24.3* 9.5 7.5  LATICACIDVEN  --   --  2.8*  --  1.3  --   --   --    Liver Function Tests: Recent Labs  Lab 12/17/17 0324 12/20/17 0256  AST 30 19  ALT 17 21  ALKPHOS 43 57  BILITOT 1.3* 0.5  PROT 4.8* 6.6  ALBUMIN 2.1* 2.9*   No results for input(s): LIPASE, AMYLASE in the last 168 hours. No results for input(s): AMMONIA in the last 168 hours. ABG    Component Value Date/Time   PHART 7.343 (L) 12/20/2017 1417   PCO2ART 49.6 (H) 12/20/2017 1417   PO2ART 104.0 12/20/2017 1417   HCO3 26.9 12/20/2017 1417   TCO2 28 12/20/2017 1417   O2SAT 98.0 12/20/2017 1417    Coagulation Profile: No results for input(s): INR, PROTIME in the last 168 hours. Cardiac Enzymes: Recent Labs  Lab 12/19/17 1526 12/19/17 1800 12/20/17 0107 12/20/17 1319 12/21/17 0917  CKTOTAL  --   --   --   --  33*  TROPONINI <0.03 <0.03 0.03* <0.03  --    HbA1C: No results found for: HGBA1C CBG: Recent  Labs  Lab 12/21/17 1949 12/22/17 0007 12/22/17 0405 12/22/17 0743 12/22/17 1130  GLUCAP 167* 191* 154* 177* 203*    ANA+ 1:320, Speckled  This patient is critically ill with multiple organ system failure; which, requires frequent high complexity decision making, assessment, support, evaluation, and titration of therapies. This was completed through the application of advanced monitoring technologies and extensive interpretation of multiple databases. During this encounter critical care time was devoted to patient care services described in this note for 40 minutes.   Garner Nash, DO [facility] 12/22/2017 12:05 PM  Personal pager: [phone] If unanswered, please page CCM  On-call: [phone]

## 2017-12-23 ENCOUNTER — Inpatient Hospital Stay (HOSPITAL_COMMUNITY): Payer: Medicare Other

## 2017-12-23 LAB — CBC
HCT: 33.6 % — ABNORMAL LOW (ref 36.0–46.0)
Hemoglobin: 10.5 g/dL — ABNORMAL LOW (ref 12.0–15.0)
MCH: 28 pg (ref 26.0–34.0)
MCHC: 31.3 g/dL (ref 30.0–36.0)
MCV: 89.6 fL (ref 78.0–100.0)
PLATELETS: 176 10*3/uL (ref 150–400)
RBC: 3.75 MIL/uL — ABNORMAL LOW (ref 3.87–5.11)
RDW: 15.2 % (ref 11.5–15.5)
WBC: 11 10*3/uL — AB (ref 4.0–10.5)

## 2017-12-23 LAB — MAGNESIUM: Magnesium: 2.3 mg/dL (ref 1.7–2.4)

## 2017-12-23 LAB — ACID FAST CULTURE WITH REFLEXED SENSITIVITIES

## 2017-12-23 LAB — CBC WITH DIFFERENTIAL/PLATELET
Abs Immature Granulocytes: 0.4 10*3/uL — ABNORMAL HIGH (ref 0.0–0.1)
BASOS ABS: 0 10*3/uL (ref 0.0–0.1)
BASOS PCT: 0 %
EOS ABS: 0 10*3/uL (ref 0.0–0.7)
EOS PCT: 0 %
HCT: 32.6 % — ABNORMAL LOW (ref 36.0–46.0)
Hemoglobin: 10.1 g/dL — ABNORMAL LOW (ref 12.0–15.0)
Immature Granulocytes: 4 %
Lymphocytes Relative: 5 %
Lymphs Abs: 0.5 10*3/uL — ABNORMAL LOW (ref 0.7–4.0)
MCH: 27.7 pg (ref 26.0–34.0)
MCHC: 31 g/dL (ref 30.0–36.0)
MCV: 89.3 fL (ref 78.0–100.0)
Monocytes Absolute: 0.6 10*3/uL (ref 0.1–1.0)
Monocytes Relative: 6 %
Neutro Abs: 8.4 10*3/uL — ABNORMAL HIGH (ref 1.7–7.7)
Neutrophils Relative %: 85 %
PLATELETS: 144 10*3/uL — AB (ref 150–400)
RBC: 3.65 MIL/uL — AB (ref 3.87–5.11)
RDW: 15.2 % (ref 11.5–15.5)
WBC: 9.9 10*3/uL (ref 4.0–10.5)

## 2017-12-23 LAB — POCT I-STAT 3, ART BLOOD GAS (G3+)
ACID-BASE EXCESS: 9 mmol/L — AB (ref 0.0–2.0)
Bicarbonate: 33.3 mmol/L — ABNORMAL HIGH (ref 20.0–28.0)
O2 SAT: 99 %
PO2 ART: 122 mmHg — AB (ref 83.0–108.0)
TCO2: 35 mmol/L — ABNORMAL HIGH (ref 22–32)
pCO2 arterial: 42.9 mmHg (ref 32.0–48.0)
pH, Arterial: 7.497 — ABNORMAL HIGH (ref 7.350–7.450)

## 2017-12-23 LAB — GLUCOSE, CAPILLARY
GLUCOSE-CAPILLARY: 156 mg/dL — AB (ref 70–99)
GLUCOSE-CAPILLARY: 150 mg/dL — AB (ref 70–99)
GLUCOSE-CAPILLARY: 183 mg/dL — AB (ref 70–99)
Glucose-Capillary: 142 mg/dL — ABNORMAL HIGH (ref 70–99)
Glucose-Capillary: 170 mg/dL — ABNORMAL HIGH (ref 70–99)

## 2017-12-23 LAB — BASIC METABOLIC PANEL
ANION GAP: 9 (ref 5–15)
BUN: 73 mg/dL — ABNORMAL HIGH (ref 8–23)
CALCIUM: 9.1 mg/dL (ref 8.9–10.3)
CO2: 32 mmol/L (ref 22–32)
Chloride: 110 mmol/L (ref 98–111)
Creatinine, Ser: 1.2 mg/dL — ABNORMAL HIGH (ref 0.44–1.00)
GFR calc Af Amer: 53 mL/min — ABNORMAL LOW (ref >60)
GFR, EST NON AFRICAN AMERICAN: 46 mL/min — AB (ref >60)
GLUCOSE: 149 mg/dL — AB (ref 70–99)
Potassium: 3.9 mmol/L (ref 3.5–5.1)
Sodium: 151 mmol/L — ABNORMAL HIGH (ref 135–145)

## 2017-12-23 LAB — HYPERSENSITIVITY PNEUMONITIS
A. Pullulans Abs: NEGATIVE
A.Fumigatus #1 Abs: NEGATIVE
MICROPOLYSPORA FAENI IGG: NEGATIVE
PIGEON SERUM ABS: NEGATIVE
THERMOACTINOMYCES VULGARIS IGG: NEGATIVE
Thermoact. Saccharii: NEGATIVE

## 2017-12-23 LAB — ACID FAST SMEAR (AFB)

## 2017-12-23 LAB — PATHOLOGIST SMEAR REVIEW

## 2017-12-23 LAB — ACID FAST SMEAR (AFB, MYCOBACTERIA)

## 2017-12-23 LAB — HISTONE ANTIBODIES, IGG, BLOOD: DNA-HISTONE: 0.9 U (ref 0.0–0.9)

## 2017-12-23 LAB — PHOSPHORUS: Phosphorus: 2.8 mg/dL (ref 2.5–4.6)

## 2017-12-23 LAB — ACID FAST CULTURE WITH REFLEXED SENSITIVITIES (MYCOBACTERIA)

## 2017-12-23 MED ORDER — ACETAMINOPHEN 160 MG/5ML PO SOLN
650.0000 mg | Freq: Four times a day (QID) | ORAL | Status: DC | PRN
  Administered 2017-12-23: 650 mg via ORAL
  Filled 2017-12-23: qty 20.3

## 2017-12-23 MED ORDER — FUROSEMIDE 10 MG/ML IJ SOLN
40.0000 mg | Freq: Three times a day (TID) | INTRAMUSCULAR | Status: AC
  Administered 2017-12-23 (×2): 40 mg via INTRAVENOUS
  Filled 2017-12-23 (×2): qty 4

## 2017-12-23 MED ORDER — POTASSIUM CHLORIDE 20 MEQ/15ML (10%) PO SOLN
40.0000 meq | Freq: Three times a day (TID) | ORAL | Status: AC
  Administered 2017-12-23 (×2): 40 meq
  Filled 2017-12-23 (×3): qty 30

## 2017-12-23 MED ORDER — FAMOTIDINE 40 MG/5ML PO SUSR
20.0000 mg | Freq: Every day | ORAL | Status: DC
  Administered 2017-12-23 – 2017-12-24 (×2): 20 mg via ORAL
  Filled 2017-12-23 (×2): qty 2.5

## 2017-12-23 MED ORDER — VITAL HIGH PROTEIN PO LIQD
1000.0000 mL | ORAL | Status: DC
  Administered 2017-12-23 – 2017-12-27 (×6): 1000 mL

## 2017-12-23 NOTE — Progress Notes (Signed)
Nutrition Follow-up  DOCUMENTATION CODES:   Not applicable  INTERVENTION:   Tube Feeding:  Increase Vital High Protein to 60 ml/hr Discontinued Pro-Stat 30 mL TID Provides 1440 kcals, 127 g of protein and 1210 mL of free water  Total free water with current flushes: 1810 mL   NUTRITION DIAGNOSIS:   Inadequate oral intake related to acute illness as evidenced by NPO status.  Being addressed via TF   GOAL:   Provide needs based on ASPEN/SCCM guidelines  Met  MONITOR:   TF tolerance, Vent status, Labs, Weight trends  REASON FOR ASSESSMENT:   Ventilator, Consult Enteral/tube feeding initiation and management  ASSESSMENT:    67 yo female admitted with acute severe persistent respiratory failure, failed BiPap requiring intubation, 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 PMH of HTN, HLD, GERD   Patient remains on ventilator support, off vasopressors, off paralytics MV: 8.3 L/min Temp (24hrs), Avg:99 F (37.2 C), Min:98.1 F (36.7 C), Max:100.4 F (38 C)  Propofol: OFF  Tolerating Vital High Protein @ 30 ml/hr, Pro-Stat 30 mL TID via Cortrak  Utilizing EDW 66 kg at present; current wt 67.4 kg Hypernatremia persists  Labs: sodium 151, Creatinine 1.20, BUN 73 Meds: lasix, solumedrol, MVI, thiamine  Diet Order:   Diet Order            Diet NPO time specified  Diet effective now              EDUCATION NEEDS:   Not appropriate for education at this time  Skin:  Skin Assessment: Reviewed RN Assessment  Last BM:  8/30  Height:   Ht Readings from Last 1 Encounters:  12/16/17 5' 1"  (1.549 m)    Weight:   Wt Readings from Last 1 Encounters:  12/23/17 67.4 kg    BMI:  Body mass index is 28.08 kg/m.  Estimated Nutritional Needs:   Kcal:  1485 kcals   Protein:  100-132 g  Fluid:  >/ = 1.7 L   Kerman Passey MS, RD, LDN, CNSC [phone] Pager  [phone] Weekend/On-Call Pager

## 2017-12-23 NOTE — Progress Notes (Signed)
Placed patient back on full support for the night due to increased work of breathing and coughing.

## 2017-12-23 NOTE — Progress Notes (Addendum)
Tasha [name]  ZOX:[id number] DOB: [birth year] DOA: 12/16/2017 PCP: Dettinger, Fransisca Kaufmann, MD    LOS: 7 days   Reason for Consult / Chief Complaint:  Acute Hypoxic respiratory failure   Consulting MD and date:  Yates   HPI/Summary of hospital stay:  67 year old female patient admitted to [facility] 8/27 w/acute hypoxic respiratory failure in setting of bilateral pulmonary infiltrates, working diagnosis of subacute ILD versus organizing pneumonia felt possibly secondary to inhalation injury from antifreeze leak  in her car (initially seen and treated w/ Doxy 8/12; seen again & switched to levaquin 8/16 & admitted 8/18 to [facility] treated w/ broad spec abx as well as antifungals). [facility] on 8/27: Pulm consulted and  Autoimmune panel,  hypersensitivity pneumonitis panel, strep and Legionella antigen sent. 8/28: CT chest showing extensive consolidative patchy airspace disease with groundglass attenuation and mild bronchiectasis throughout both bases.  No honeycombing.  Had elevated BNP, versus diuresis.  TTE normal with exception of mild pulmonary hypertension.  Treated with broad-spectrum antibiotics.  Transition to high flow oxygen. 8/29: ID work-up negative, no fever or white cell count.  Respiratory virus, urine strep and Legionella negative. Sed rate elevated desaturating but of high flow oxygen down to the 70s.  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  Antibiotics stopped.  High-dose steroids initiated (250 mg q 6).  8/30: Rapid response called overnight.  Received  extra Lasix.  Transitioned to noninvasive positive pressure ventilation.  Moving her to the intensive care given increased WOB, intubated and bronched  8/31: PTX on vent, Right chest drain placed  9/1: steroid taper started, dropped to 60mg  Q6h solumedrol. Called to the bedside mid-morning rounding. Patient in rapid SVT... Given metoprolol to slow down. ECG +Afib  RVR 12/23/2017 currently sinus rhythm on amiodarone drip.  Tolerating pressure support.  Subjective:  Awake and alert following commands.  Family at bedside updated.  Objective   Blood pressure (!) 143/72, pulse 79, temperature 99.3 F (37.4 C), resp. rate (!) 22, height 5\' 1"  (1.549 m), weight 67.4 kg, SpO2 94 %. CVP:  [4 mmHg-9 mmHg] 8 mmHg  Vent Mode: PRVC FiO2 (%):  [30 %-40 %] 30 % Set Rate:  [28 bmp] 28 bmp Vt Set:  [340 mL] 340 mL PEEP:  [5 cmH20] 5 cmH20 Plateau Pressure:  [11 cmH20-28 cmH20] 24 cmH20   Intake/Output Summary (Last 24 hours) at 12/23/2017 0914 Last data filed at 12/23/2017 0600 Gross per 24 hour  Intake 2051.78 ml  Output 3185 ml  Net -1133.22 ml   Filed Weights   12/21/17 0500 12/22/17 0500 12/23/17 0302  Weight: 66.2 kg 67.5 kg 67.4 kg   Examination: General: Elderly female currently on ability to wear support awake and follows commands HEENT: Tracheal tube in place orogastric tube in place Neuro: Awake and follows commands CV: s1s2 rrr, no m/r/g currently on amiodarone drip sinus rhythm 80 PULM: even/non-labored, lungs bilaterally coarse rhonchi WJ:XBJY, non-tender, bsx4 active  Extremities: warm/dry, 1+ edema  Skin: no rashes or lesions     Consults: date of consult/date signed off & final recs:  Pulmonary consulted on 8/27  Procedures: 8/31 - Right pigtail catheter, chest for PTx   Significant Diagnostic Tests: 8/29 TEE: EF 65-70% with no wall motion abnormality grade 1 diastolic dysfunction 7/82: High-resolution CT chest showing bilateral lower lobe consolidation with groundglass attenuation  Auto-immune labs on 8/27 and 8/28:  ACE level less than 15  rheumatoid factor normal at 10.6.  Sed rate elevated at 69.  Procalcitonin less than 0.10. ANA: Positive Anti-DNA antibody negative Sojourner's a and B antibodies both negative CCP antibodies negative Anti-scleroderma antibody negative MPO/PR-3: neg Respiratory viral panel:  Negative FANA staining: Elevated 1: 320 CRP elevated at 6.9 Hypersensitivity pneumonitis panel still pending>>> Histone Ab >>> Pending   Micro Data: Urine strep 8/28-negative Urine Legionella 8/28-negative  Antimicrobials:  Received full course of therapy at outside hospital with initially Rocephin and azithromycin, later changed to Zosyn and a azithromycin.   Cefepime 8/27 to 8/28 Vancomycin 8/27 to 8/28.  Microbial stopped at this time  Resolved Hospital Problem list    Assessment & Plan:   Acute hypoxic respiratory failure in the setting of diffuse consolidative pulmonary infiltrates predominantly in the bases.  Working diagnosis of BOOP/COP Possibly due to inhalation injury from antifreeze or coolant per family, seems unlikely. Idiopathic ARDS Respiratory acidosis Right-sided pneumothorax, subcutaneous emphysema likely secondary to barotrauma and poor lung compliance in the setting of ARDS. - ANA+ 1:320 Speckled  - Resp viral panel negative  - Systemic steroid pulse initiated 8/29, taper started 9/1 - ACE inhibitor discontinued 8/29 - Antihistone antibodies pending  New onset atrial fibrillation with RVR associated hypotension and hemodynamic compromise,, resolved with rate control on amiodarone.  Positive cumulative fluid balance, +4 L  Hypertension Hyperlipidemia Mild anemia Hypokalemia treated AKI, resolved Hyperglycemia secondary to stress induced/steroid-induced Hypomagnesemia, resolved Hypotension, shock likely secondary to medication effect, sedation and paralysis, resolved Leukocytosis, secondary to stress and steroids  Plan:   Remains intubated on mechanical ventilatory support in the setting of ARDS but currently on pressure support of 15 awake and weaning. Continue vent bundle wean per protocol  Note chest tube in place on right we will recheck chest x-ray  She is on amiodarone drip but with a normal sinus rhythm currently  Continue to wean IV  steroids currently on 40 mg every 8 hours  Sliding scale insulin for glucose coverage  She is off all paralytics  Off all vasopressors  Continue sedation as needed  Hold diuresis with sodium 148, 12/23/2017  free water    Disposition / Summary of Today's Plan 12/23/17   Wean per protocol Was actively diuresed -1.1 L   Best Practice / Goals of Care / Disposition.   DVT prophylaxis: scd, Lovenox GI prophylaxis: PPI Diet: NPO Mobility:BR Code Status: full code Family Communication: 12/23/2017 family updated at bedside.  Labs   CBC: Recent Labs  Lab 12/17/17 0324 12/19/17 0202 12/20/17 0256 12/21/17 0254 12/22/17 0500 12/23/17 0417  WBC 7.6  --  24.3* 9.5 7.5 9.9  NEUTROABS  --   --  21.4* 8.2* 6.5 8.4*  HGB 9.2* 11.6 11.6* 9.6* 11.0* 10.1*  HCT 29.4*  --  37.3 30.6* 34.9* 32.6*  MCV 89.4  --  91.6 89.5 88.6 89.3  PLT 113*  --  250 154 110* 614*   Basic Metabolic Panel: Recent Labs  Lab 12/20/17 0256  12/21/17 0254 12/21/17 0917 12/21/17 1746 12/22/17 0500 12/22/17 1639  NA 145  --   --  145 146* 148* 148*  K 3.4*  --   --  3.2* 4.1 4.1 3.0*  CL 105  --   --  108 113* 113* 110  CO2 30  --   --  28 27 27 29   GLUCOSE 176*  --   --  167* 192* 183* 175*  BUN 26*  --   --  55* 61* 72* 71*  CREATININE 1.78*  --   --  1.47* 1.42* 1.40* 1.24*  CALCIUM 8.4*  --   --  8.5* 8.4* 8.6* 8.7*  MG 1.9   < > 2.9* 2.9* 2.4 2.6* 2.3  PHOS 7.7*   < > 3.5 3.8 2.5 2.3* 2.2*   < > = values in this interval not displayed.   GFR: Estimated Creatinine Clearance: 38.6 mL/min (A) (by C-G formula based on SCr of 1.24 mg/dL (H)). Recent Labs  Lab 12/16/17 1629  12/19/17 1301 12/19/17 1526 12/20/17 0000 12/20/17 0256 12/21/17 0254 12/22/17 0500 12/23/17 0417  PROCALCITON <0.10  --   --  <0.10  --  0.21 0.12  --   --   WBC 10.2   < >  --   --   --  24.3* 9.5 7.5 9.9  LATICACIDVEN  --   --  2.8*  --  1.3  --   --   --   --    < > = values in this interval not displayed.    Liver Function Tests: Recent Labs  Lab 12/17/17 0324 12/20/17 0256  AST 30 19  ALT 17 21  ALKPHOS 43 57  BILITOT 1.3* 0.5  PROT 4.8* 6.6  ALBUMIN 2.1* 2.9*   No results for input(s): LIPASE, AMYLASE in the last 168 hours. No results for input(s): AMMONIA in the last 168 hours. ABG    Component Value Date/Time   PHART 7.343 (L) 12/20/2017 1417   PCO2ART 49.6 (H) 12/20/2017 1417   PO2ART 104.0 12/20/2017 1417   HCO3 26.9 12/20/2017 1417   TCO2 28 12/20/2017 1417   O2SAT 98.0 12/20/2017 1417    Coagulation Profile: No results for input(s): INR, PROTIME in the last 168 hours. Cardiac Enzymes: Recent Labs  Lab 12/19/17 1526 12/19/17 1800 12/20/17 0107 12/20/17 1319 12/21/17 0917  CKTOTAL  --   --   --   --  33*  TROPONINI <0.03 <0.03 0.03* <0.03  --    HbA1C: No results found for: HGBA1C CBG: Recent Labs  Lab 12/22/17 1523 12/22/17 1953 12/22/17 2333 12/23/17 0357 12/23/17 0810  GLUCAP 153* 164* 154* 156* 150*    ANA+ 1:320, Speckled  cct 30 min    Richardson Landry Minor ACNP Maryanna Shape PCCM Pager [phone] till 1 pm If no answer page [phone] 12/23/2017, 9:15 AM  Attending Note:  68 year old female with concern for ILD who presents to PCCM in ARDS.  Patient diuresed and FiO2 is improving.  On exam, lungs with coarse BS diffusely.  I reviewed CXR myself, ETT is ok, CT is pulled out some but still in the chest.  No airleak on CT.  Will proceed with additional diureses today.  CXR and ABG in AM.  Begin PS trials but no extubation today.  F/U on labs for today and replace K.  PCCM will continue to follow.  The patient is critically ill with multiple organ systems failure and requires high complexity decision making for assessment and support, frequent evaluation and titration of therapies, application of advanced monitoring technologies and extensive interpretation of multiple databases.   Critical Care Time devoted to patient care services described in this note  is  36  Minutes. This time reflects time of care of this signee Dr Jennet Maduro. This critical care time does not reflect procedure time, or teaching time or supervisory time of PA/NP/Med student/Med Resident etc but could involve care discussion time.  Rush Farmer, M.D. [facility]monary/Critical Care Medicine. Pager: [phone]. After hours pager: [phone].

## 2017-12-24 ENCOUNTER — Inpatient Hospital Stay (HOSPITAL_COMMUNITY): Payer: Medicare Other

## 2017-12-24 LAB — BASIC METABOLIC PANEL
ANION GAP: 10 (ref 5–15)
BUN: 74 mg/dL — ABNORMAL HIGH (ref 8–23)
CHLORIDE: 112 mmol/L — AB (ref 98–111)
CO2: 30 mmol/L (ref 22–32)
CREATININE: 1.15 mg/dL — AB (ref 0.44–1.00)
Calcium: 8.9 mg/dL (ref 8.9–10.3)
GFR calc non Af Amer: 48 mL/min — ABNORMAL LOW (ref >60)
GFR, EST AFRICAN AMERICAN: 56 mL/min — AB (ref >60)
Glucose, Bld: 222 mg/dL — ABNORMAL HIGH (ref 70–99)
POTASSIUM: 4.3 mmol/L (ref 3.5–5.1)
SODIUM: 152 mmol/L — AB (ref 135–145)

## 2017-12-24 LAB — CBC WITH DIFFERENTIAL/PLATELET
ABS IMMATURE GRANULOCYTES: 0.3 10*3/uL — AB (ref 0.0–0.1)
BASOS ABS: 0 10*3/uL (ref 0.0–0.1)
Basophils Relative: 0 %
Eosinophils Absolute: 0 10*3/uL (ref 0.0–0.7)
Eosinophils Relative: 0 %
HCT: 33.6 % — ABNORMAL LOW (ref 36.0–46.0)
Hemoglobin: 10.1 g/dL — ABNORMAL LOW (ref 12.0–15.0)
IMMATURE GRANULOCYTES: 4 %
LYMPHS ABS: 0.4 10*3/uL — AB (ref 0.7–4.0)
LYMPHS PCT: 5 %
MCH: 27.4 pg (ref 26.0–34.0)
MCHC: 30.1 g/dL (ref 30.0–36.0)
MCV: 91.3 fL (ref 78.0–100.0)
Monocytes Absolute: 0.5 10*3/uL (ref 0.1–1.0)
Monocytes Relative: 7 %
NEUTROS ABS: 6.7 10*3/uL (ref 1.7–7.7)
NEUTROS PCT: 84 %
Platelets: 157 10*3/uL (ref 150–400)
RBC: 3.68 MIL/uL — AB (ref 3.87–5.11)
RDW: 15.9 % — ABNORMAL HIGH (ref 11.5–15.5)
WBC: 7.9 10*3/uL (ref 4.0–10.5)

## 2017-12-24 LAB — BLOOD GAS, ARTERIAL
ACID-BASE EXCESS: 9.8 mmol/L — AB (ref 0.0–2.0)
Bicarbonate: 34 mmol/L — ABNORMAL HIGH (ref 20.0–28.0)
Drawn by: 252031
FIO2: 30
MECHVT: 340 mL
O2 Saturation: 97.1 %
PEEP/CPAP: 5 cmH2O
Patient temperature: 98.6
RATE: 28 resp/min
pCO2 arterial: 47.9 mmHg (ref 32.0–48.0)
pH, Arterial: 7.465 — ABNORMAL HIGH (ref 7.350–7.450)
pO2, Arterial: 83.5 mmHg (ref 83.0–108.0)

## 2017-12-24 LAB — GLUCOSE, CAPILLARY
GLUCOSE-CAPILLARY: 192 mg/dL — AB (ref 70–99)
GLUCOSE-CAPILLARY: 164 mg/dL — AB (ref 70–99)
Glucose-Capillary: 165 mg/dL — ABNORMAL HIGH (ref 70–99)
Glucose-Capillary: 170 mg/dL — ABNORMAL HIGH (ref 70–99)
Glucose-Capillary: 182 mg/dL — ABNORMAL HIGH (ref 70–99)
Glucose-Capillary: 184 mg/dL — ABNORMAL HIGH (ref 70–99)
Glucose-Capillary: 185 mg/dL — ABNORMAL HIGH (ref 70–99)

## 2017-12-24 LAB — MAGNESIUM: MAGNESIUM: 2.4 mg/dL (ref 1.7–2.4)

## 2017-12-24 LAB — PNEUMOCYSTIS JIROVECI SMEAR BY DFA: Pneumocystis jiroveci Ag: NEGATIVE

## 2017-12-24 LAB — PHOSPHORUS: PHOSPHORUS: 2.8 mg/dL (ref 2.5–4.6)

## 2017-12-24 LAB — MTB NAA WITHOUT AFB CULTURE: M Tuberculosis, Naa: NEGATIVE

## 2017-12-24 MED ORDER — POTASSIUM CHLORIDE 20 MEQ/15ML (10%) PO SOLN
40.0000 meq | Freq: Three times a day (TID) | ORAL | Status: AC
  Administered 2017-12-24 (×2): 40 meq
  Filled 2017-12-24 (×2): qty 30

## 2017-12-24 MED ORDER — FUROSEMIDE 10 MG/ML IJ SOLN
40.0000 mg | Freq: Three times a day (TID) | INTRAMUSCULAR | Status: AC
  Administered 2017-12-24 (×2): 40 mg via INTRAVENOUS
  Filled 2017-12-24 (×2): qty 4

## 2017-12-24 NOTE — Progress Notes (Signed)
Tasha [name]  RSW:[id number] DOB: [birth year] DOA: 12/16/2017 PCP: Dettinger, Fransisca Kaufmann, MD    LOS: 8 days   Reason for Consult / Chief Complaint:  Acute Hypoxic respiratory failure   Consulting MD and date:  Yates   HPI/Summary of hospital stay:  67 year old female patient admitted to [facility] 8/27 w/acute hypoxic respiratory failure in setting of bilateral pulmonary infiltrates, working diagnosis of subacute ILD versus organizing pneumonia felt possibly secondary to inhalation injury from antifreeze leak  in her car (initially seen and treated w/ Doxy 8/12; seen again & switched to levaquin 8/16 & admitted 8/18 to [facility] treated w/ broad spec abx as well as antifungals). [facility] on 8/27: Pulm consulted and  Autoimmune panel,  hypersensitivity pneumonitis panel, strep and Legionella antigen sent. 8/28: CT chest showing extensive consolidative patchy airspace disease with groundglass attenuation and mild bronchiectasis throughout both bases.  No honeycombing.  Had elevated BNP, versus diuresis.  TTE normal with exception of mild pulmonary hypertension.  Treated with broad-spectrum antibiotics.  Transition to high flow oxygen. 8/29: ID work-up negative, no fever or white cell count.  Respiratory virus, urine strep and Legionella negative. Sed rate elevated desaturating but of high flow oxygen down to the 70s.  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  Antibiotics stopped.  High-dose steroids initiated (250 mg q 6).  8/30: Rapid response called overnight.  Received  extra Lasix.  Transitioned to noninvasive positive pressure ventilation.  Moving her to the intensive care given increased WOB, intubated and bronched  8/31: PTX on vent, Right chest drain placed  9/1: steroid taper started, dropped to 60mg  Q6h solumedrol. Called to the bedside mid-morning rounding. Patient in rapid SVT... Given metoprolol to slow down. ECG +Afib  RVR 12/23/2017 currently sinus rhythm on amiodarone drip.  Tolerating pressure support.  Subjective:  Weaning this AM  Objective   Blood pressure (!) 148/72, pulse 88, temperature 98.6 F (37 C), resp. rate 19, height 5\' 1"  (1.549 m), weight 67.4 kg, SpO2 95 %. CVP:  [1 mmHg-4 mmHg] 4 mmHg  Vent Mode: PSV;CPAP FiO2 (%):  [30 %-40 %] 30 % Set Rate:  [28 bmp] 28 bmp Vt Set:  [340 mL] 340 mL PEEP:  [5 cmH20] 5 cmH20 Pressure Support:  [12 cmH20-15 cmH20] 12 cmH20 Plateau Pressure:  [20 cmH20-22 cmH20] 20 cmH20   Intake/Output Summary (Last 24 hours) at 12/24/2017 1051 Last data filed at 12/24/2017 1000 Gross per 24 hour  Intake 1854.02 ml  Output 3155 ml  Net -1300.98 ml   Filed Weights   12/22/17 0500 12/23/17 0302 12/24/17 0500  Weight: 67.5 kg 67.4 kg 67.4 kg   Examination: General: Elderly female, resting in exam bed, NAD HEENT: [place]/AT, PERRL, EOM-I and MMM Neuro: Alert and interactive CV: RRR, Nl S1/S2 and -M/R/G PULM: Coarse BS diffusely GI: Soft, NT, ND and +BS Extremities: 1+ edema Skin: no rashes or lesions  Consults: date of consult/date signed off & final recs:  Pulmonary consulted on 8/27  Procedures: 8/31 - Right pigtail catheter, chest for PTx   Significant Diagnostic Tests: 8/29 TEE: EF 65-70% with no wall motion abnormality grade 1 diastolic dysfunction 0/93: High-resolution CT chest showing bilateral lower lobe consolidation with groundglass attenuation  Auto-immune labs on 8/27 and 8/28:  ACE level less than 15  rheumatoid factor normal at 10.6.   Sed rate elevated at 69.  Procalcitonin less than 0.10. ANA: Positive Anti-DNA antibody negative Sojourner's a and B  antibodies both negative CCP antibodies negative Anti-scleroderma antibody negative MPO/PR-3: neg Respiratory viral panel: Negative FANA staining: Elevated 1: 320 CRP elevated at 6.9 Hypersensitivity pneumonitis panel still pending>>> Histone Ab >>> Pending   Micro Data: Urine strep  8/28-negative Urine Legionella 8/28-negative  Antimicrobials:  Received full course of therapy at outside hospital with initially Rocephin and azithromycin, later changed to Zosyn and a azithromycin.   Cefepime 8/27 to 8/28 Vancomycin 8/27 to 8/28.  Microbial stopped at this time  Resolved Hospital Problem list    Assessment & Plan:   Acute hypoxic respiratory failure in the setting of diffuse consolidative pulmonary infiltrates predominantly in the bases.  Working diagnosis of BOOP/COP Possibly due to inhalation injury from antifreeze or coolant per family, seems unlikely. Idiopathic ARDS Respiratory acidosis Right-sided pneumothorax, subcutaneous emphysema likely secondary to barotrauma and poor lung compliance in the setting of ARDS. - ANA+ 1:320 Speckled  - Resp viral panel negative  - Systemic steroid pulse initiated 8/29, taper started 9/1 - ACE inhibitor discontinued 8/29 - Antihistone antibodies pending  New onset atrial fibrillation with RVR associated hypotension and hemodynamic compromise,, resolved with rate control on amiodarone.  Positive cumulative fluid balance, +4 L  Hypertension Hyperlipidemia Mild anemia Hypokalemia treated AKI, resolved Hyperglycemia secondary to stress induced/steroid-induced Hypomagnesemia, resolved Hypotension, shock likely secondary to medication effect, sedation and paralysis, resolved Leukocytosis, secondary to stress and steroids  Plan:   Begin PS trials today and decrease to 8/5  Titrate O2 for sat of 88-92%  Active diureses again today with Lasix 40 mg IV q8 x2 doses  Replace K  Continue vent bundle wean per protocol  CXR for PTX  D/C amiodarone  Maintain solumedrol on 40 mg IV q8 hours  ISS and CBGs  Maintain off paralytics  D/C pressors, monitor BP with diureses   Continue sedation as needed   Disposition / Summary of Today's Plan 12/24/17   Wean per protocol Was actively diuresed -1.1 L  Best  Practice / Goals of Care / Disposition.   DVT prophylaxis: scd, Lovenox GI prophylaxis: PPI Diet: NPO Mobility:BR Code Status: full code Family Communication: 12/23/2017 family updated at bedside.  Labs   CBC: Recent Labs  Lab 12/20/17 0256 12/21/17 0254 12/22/17 0500 12/23/17 0417 12/23/17 1150 12/24/17 0455  WBC 24.3* 9.5 7.5 9.9 11.0* 7.9  NEUTROABS 21.4* 8.2* 6.5 8.4*  --  6.7  HGB 11.6* 9.6* 11.0* 10.1* 10.5* 10.1*  HCT 37.3 30.6* 34.9* 32.6* 33.6* 33.6*  MCV 91.6 89.5 88.6 89.3 89.6 91.3  PLT 250 154 110* 144* 176 831   Basic Metabolic Panel: Recent Labs  Lab 12/21/17 1746 12/22/17 0500 12/22/17 1639 12/23/17 1150 12/24/17 0455  NA 146* 148* 148* 151* 152*  K 4.1 4.1 3.0* 3.9 4.3  CL 113* 113* 110 110 112*  CO2 27 27 29  32 30  GLUCOSE 192* 183* 175* 149* 222*  BUN 61* 72* 71* 73* 74*  CREATININE 1.42* 1.40* 1.24* 1.20* 1.15*  CALCIUM 8.4* 8.6* 8.7* 9.1 8.9  MG 2.4 2.6* 2.3 2.3 2.4  PHOS 2.5 2.3* 2.2* 2.8 2.8   GFR: Estimated Creatinine Clearance: 41.7 mL/min (A) (by C-G formula based on SCr of 1.15 mg/dL (H)). Recent Labs  Lab 12/19/17 1301 12/19/17 1526 12/20/17 0000  12/20/17 0256 12/21/17 0254 12/22/17 0500 12/23/17 0417 12/23/17 1150 12/24/17 0455  PROCALCITON  --  <0.10  --   --  0.21 0.12  --   --   --   --   WBC  --   --   --    < >  24.3* 9.5 7.5 9.9 11.0* 7.9  LATICACIDVEN 2.8*  --  1.3  --   --   --   --   --   --   --    < > = values in this interval not displayed.   Liver Function Tests: Recent Labs  Lab 12/20/17 0256  AST 19  ALT 21  ALKPHOS 57  BILITOT 0.5  PROT 6.6  ALBUMIN 2.9*   No results for input(s): LIPASE, AMYLASE in the last 168 hours. No results for input(s): AMMONIA in the last 168 hours. ABG    Component Value Date/Time   PHART 7.465 (H) 12/24/2017 0340   PCO2ART 47.9 12/24/2017 0340   PO2ART 83.5 12/24/2017 0340   HCO3 34.0 (H) 12/24/2017 0340   TCO2 35 (H) 12/23/2017 1013   O2SAT 97.1 12/24/2017 0340      Coagulation Profile: No results for input(s): INR, PROTIME in the last 168 hours. Cardiac Enzymes: Recent Labs  Lab 12/19/17 1526 12/19/17 1800 12/20/17 0107 12/20/17 1319 12/21/17 0917  CKTOTAL  --   --   --   --  33*  TROPONINI <0.03 <0.03 0.03* <0.03  --    HbA1C: No results found for: HGBA1C CBG: Recent Labs  Lab 12/23/17 1624 12/23/17 2022 12/23/17 2352 12/24/17 0423 12/24/17 0757  GLUCAP 170* 183* 170* 185* 192*    ANA+ 1:320, Speckled  Family updated bedside.  The patient is critically ill with multiple organ systems failure and requires high complexity decision making for assessment and support, frequent evaluation and titration of therapies, application of advanced monitoring technologies and extensive interpretation of multiple databases.   Critical Care Time devoted to patient care services described in this note is  36  Minutes. This time reflects time of care of this signee Dr Jennet Maduro. This critical care time does not reflect procedure time, or teaching time or supervisory time of PA/NP/Med student/Med Resident etc but could involve care discussion time.  Rush Farmer, M.D. [facility] Pulmonary/Critical Care Medicine. Pager: [phone]. After hours pager: [phone].

## 2017-12-25 ENCOUNTER — Inpatient Hospital Stay (HOSPITAL_COMMUNITY): Payer: Medicare Other

## 2017-12-25 LAB — BASIC METABOLIC PANEL
Anion gap: 11 (ref 5–15)
BUN: 73 mg/dL — AB (ref 8–23)
CALCIUM: 9 mg/dL (ref 8.9–10.3)
CHLORIDE: 111 mmol/L (ref 98–111)
CO2: 32 mmol/L (ref 22–32)
Creatinine, Ser: 1.05 mg/dL — ABNORMAL HIGH (ref 0.44–1.00)
GFR calc Af Amer: >60 mL/min (ref >60)
GFR calc non Af Amer: 54 mL/min — ABNORMAL LOW (ref >60)
Glucose, Bld: 188 mg/dL — ABNORMAL HIGH (ref 70–99)
Potassium: 4 mmol/L (ref 3.5–5.1)
SODIUM: 154 mmol/L — AB (ref 135–145)

## 2017-12-25 LAB — POCT I-STAT 3, ART BLOOD GAS (G3+)
Acid-Base Excess: 11 mmol/L — ABNORMAL HIGH (ref 0.0–2.0)
BICARBONATE: 35.6 mmol/L — AB (ref 20.0–28.0)
O2 Saturation: 96 %
PCO2 ART: 46.9 mmHg (ref 32.0–48.0)
Patient temperature: 99.3
TCO2: 37 mmol/L — AB (ref 22–32)
pH, Arterial: 7.491 — ABNORMAL HIGH (ref 7.350–7.450)
pO2, Arterial: 81 mmHg — ABNORMAL LOW (ref 83.0–108.0)

## 2017-12-25 LAB — CBC
HEMATOCRIT: 35.7 % — AB (ref 36.0–46.0)
HEMOGLOBIN: 10.8 g/dL — AB (ref 12.0–15.0)
MCH: 27.9 pg (ref 26.0–34.0)
MCHC: 30.3 g/dL (ref 30.0–36.0)
MCV: 92.2 fL (ref 78.0–100.0)
Platelets: 186 10*3/uL (ref 150–400)
RBC: 3.87 MIL/uL (ref 3.87–5.11)
RDW: 15.9 % — AB (ref 11.5–15.5)
WBC: 9.1 10*3/uL (ref 4.0–10.5)

## 2017-12-25 LAB — PHOSPHORUS: Phosphorus: 3.8 mg/dL (ref 2.5–4.6)

## 2017-12-25 LAB — GLUCOSE, CAPILLARY
GLUCOSE-CAPILLARY: 187 mg/dL — AB (ref 70–99)
GLUCOSE-CAPILLARY: 167 mg/dL — AB (ref 70–99)
GLUCOSE-CAPILLARY: 166 mg/dL — AB (ref 70–99)
GLUCOSE-CAPILLARY: 163 mg/dL — AB (ref 70–99)
Glucose-Capillary: 122 mg/dL — ABNORMAL HIGH (ref 70–99)
Glucose-Capillary: 180 mg/dL — ABNORMAL HIGH (ref 70–99)

## 2017-12-25 LAB — MAGNESIUM: Magnesium: 2.3 mg/dL (ref 1.7–2.4)

## 2017-12-25 MED ORDER — FREE WATER
200.0000 mL | Freq: Four times a day (QID) | Status: DC
  Administered 2017-12-25 – 2017-12-26 (×4): 200 mL

## 2017-12-25 MED ORDER — LORAZEPAM 2 MG/ML IJ SOLN
0.5000 mg | Freq: Once | INTRAMUSCULAR | Status: AC
  Administered 2017-12-25: 0.5 mg via INTRAVENOUS
  Filled 2017-12-25: qty 1

## 2017-12-25 MED ORDER — SODIUM CHLORIDE 0.9 % IV SOLN: INTRAVENOUS | Status: DC | PRN

## 2017-12-25 MED ORDER — VITAMIN B-1 100 MG PO TABS
100.0000 mg | ORAL_TABLET | Freq: Every day | ORAL | Status: DC
  Administered 2017-12-26 – 2017-12-29 (×4): 100 mg
  Filled 2017-12-25 (×4): qty 1

## 2017-12-25 MED ORDER — POTASSIUM CHLORIDE 20 MEQ/15ML (10%) PO SOLN
40.0000 meq | Freq: Three times a day (TID) | ORAL | Status: AC
  Administered 2017-12-25 (×2): 40 meq
  Filled 2017-12-25 (×2): qty 30

## 2017-12-25 MED ORDER — METOPROLOL TARTRATE 25 MG/10 ML ORAL SUSPENSION
25.0000 mg | Freq: Three times a day (TID) | ORAL | Status: DC
  Administered 2017-12-25 – 2017-12-28 (×7): 25 mg
  Filled 2017-12-25 (×8): qty 10

## 2017-12-25 MED ORDER — FAMOTIDINE 40 MG/5ML PO SUSR
20.0000 mg | Freq: Every day | ORAL | Status: DC
  Administered 2017-12-25 – 2017-12-27 (×3): 20 mg
  Filled 2017-12-25 (×4): qty 2.5

## 2017-12-25 MED ORDER — ADULT MULTIVITAMIN LIQUID CH
15.0000 mL | Freq: Every day | ORAL | Status: DC
  Administered 2017-12-26 – 2017-12-29 (×4): 15 mL
  Filled 2017-12-25 (×4): qty 15

## 2017-12-25 MED ORDER — FUROSEMIDE 10 MG/ML IJ SOLN
40.0000 mg | Freq: Three times a day (TID) | INTRAMUSCULAR | Status: AC
  Administered 2017-12-25 (×2): 40 mg via INTRAVENOUS
  Filled 2017-12-25 (×2): qty 4

## 2017-12-25 MED ORDER — ORAL CARE MOUTH RINSE
15.0000 mL | Freq: Two times a day (BID) | OROMUCOSAL | Status: DC
  Administered 2017-12-26 – 2017-12-30 (×7): 15 mL via OROMUCOSAL

## 2017-12-25 NOTE — Care Management Important Message (Signed)
Important Message  Patient Details  Name: Tasha [name] MRN: [id number] Date of Birth: [birth year]   Medicare Important Message Given:  Yes    [name] 12/25/2017, 2:22 PM

## 2017-12-25 NOTE — Procedures (Signed)
Extubation Procedure Note  Patient Details:   Name: Tasha [name] DOB: Jul 16, 1950 MRN: [id number]   Airway Documentation:    Vent end date: 12/25/17 Vent end time: 0931   Evaluation  O2 sats: stable throughout Complications: No apparent complications Patient did tolerate procedure well. Bilateral Breath Sounds: Diminished   Yes   Pt extubated per order, to 3 lpm nasal cannula with no apparent complications. Positive cuff leak was noted prior to extubation. Pt is alert and able to speak. Vitals are stable, RT to continue to monitor.  Bohdi Leeds F Daymeon Fischman 12/25/2017, 9:37 AM

## 2017-12-25 NOTE — Progress Notes (Signed)
190 ml of fentanyl IV wasted in sink with Associate Professor

## 2017-12-25 NOTE — Progress Notes (Addendum)
Tasha [name]  ONG:[id number] DOB: 08/18/1950 DOA: 12/16/2017 PCP: Dettinger, Fransisca Kaufmann, MD    LOS: 9 days   Reason for Consult / Chief Complaint:  Acute Hypoxic respiratory failure   Consulting MD and date:  Yates   HPI/Summary of hospital stay:  67 year old female patient admitted to [facility] 8/27 w/acute hypoxic respiratory failure in setting of bilateral pulmonary infiltrates, working diagnosis of subacute ILD versus organizing pneumonia felt possibly secondary to inhalation injury from antifreeze leak  in her car (initially seen and treated w/ Doxy 8/12; seen again & switched to levaquin 8/16 & admitted 8/18 to [facility] treated w/ broad spec abx as well as antifungals). [facility] on 8/27: Pulm consulted and  Autoimmune panel,  hypersensitivity pneumonitis panel, strep and Legionella antigen sent. 8/28: CT chest showing extensive consolidative patchy airspace disease with groundglass attenuation and mild bronchiectasis throughout both bases.  No honeycombing.  Had elevated BNP, versus diuresis.  TTE normal with exception of mild pulmonary hypertension.  Treated with broad-spectrum antibiotics.  Transition to high flow oxygen. 8/29: ID work-up negative, no fever or white cell count.  Respiratory virus, urine strep and Legionella negative. Sed rate elevated desaturating but of high flow oxygen down to the 70s.  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  Antibiotics stopped.  High-dose steroids initiated (250 mg q 6).  8/30: Rapid response called overnight.  Received  extra Lasix.  Transitioned to noninvasive positive pressure ventilation.  Moving her to the intensive care given increased WOB, intubated and bronched  8/31: PTX on vent, Right chest drain placed  9/1: steroid taper started, dropped to 60mg  Q6h solumedrol. Called to the bedside mid-morning rounding. Patient in rapid SVT... Given metoprolol to slow down. ECG +Afib  RVR 12/23/2017 currently sinus rhythm on amiodarone drip.  Tolerating pressure support.  Subjective:  Stable this AM, no events  Objective   Blood pressure (!) 143/69, pulse (!) 112, temperature 98.6 F (37 C), resp. rate (!) 25, height 5\' 1"  (1.549 m), weight 65.7 kg, SpO2 94 %. CVP:  [2 mmHg-8 mmHg] 2 mmHg  Vent Mode: PSV;CPAP FiO2 (%):  [30 %-32 %] 32 % Set Rate:  [28 bmp] 28 bmp Vt Set:  [340 mL] 340 mL PEEP:  [5 cmH20] 5 cmH20 Pressure Support:  [5 cmH20-8 cmH20] 5 cmH20 Plateau Pressure:  [18 cmH20-23 cmH20] 23 cmH20   Intake/Output Summary (Last 24 hours) at 12/25/2017 1135 Last data filed at 12/25/2017 1000 Gross per 24 hour  Intake 1889.89 ml  Output 3300 ml  Net -1410.11 ml   Filed Weights   12/23/17 0302 12/24/17 0500 12/25/17 0455  Weight: 67.4 kg 67.4 kg 65.7 kg   Examination: General: Chronically ill appearing female, NAD HEENT: [place]/AT, PERRL, no EOM Neuro: Sedated, unresponsive  CV: RRR, Nl S1/S2 and -M/R/G PULM: Coarse BS diffusely GI: Soft, NT, ND and +BS Extremities: 1+ edema and -tenderness  Skin: no rashes or lesions  Consults: date of consult/date signed off & final recs:  Pulmonary consulted on 8/27  Procedures: 8/31 - Right pigtail catheter, chest for PTx   Significant Diagnostic Tests: 8/29 TEE: EF 65-70% with no wall motion abnormality grade 1 diastolic dysfunction 4/40: High-resolution CT chest showing bilateral lower lobe consolidation with groundglass attenuation  Auto-immune labs on 8/27 and 8/28:  ACE level less than 15  rheumatoid factor normal at 10.6.   Sed rate elevated at 69.  Procalcitonin less than 0.10. ANA: Positive Anti-DNA antibody negative  Sojourner's a and B antibodies both negative CCP antibodies negative Anti-scleroderma antibody negative MPO/PR-3: neg Respiratory viral panel: Negative FANA staining: Elevated 1: 320 CRP elevated at 6.9 Hypersensitivity pneumonitis panel still pending>>> Histone Ab >>> Pending    Micro Data: Urine strep 8/28-negative Urine Legionella 8/28-negative  Antimicrobials:  Received full course of therapy at outside hospital with initially Rocephin and azithromycin, later changed to Zosyn and a azithromycin.   Cefepime 8/27 to 8/28 Vancomycin 8/27 to 8/28.  Microbial stopped at this time  Resolved Hospital Problem list    Assessment & Plan:   Acute hypoxic respiratory failure in the setting of diffuse consolidative pulmonary infiltrates predominantly in the bases.  Working diagnosis of BOOP/COP Possibly due to inhalation injury from antifreeze or coolant per family, seems unlikely. Idiopathic ARDS Respiratory acidosis Right-sided pneumothorax, subcutaneous emphysema likely secondary to barotrauma and poor lung compliance in the setting of ARDS. - ANA+ 1:320 Speckled  - Resp viral panel negative  - Systemic steroid pulse initiated 8/29, taper started 9/1 - ACE inhibitor discontinued 8/29 - Antihistone antibodies pending  New onset atrial fibrillation with RVR associated hypotension and hemodynamic compromise,, resolved with rate control on amiodarone.  Positive cumulative fluid balance, +4 L  Hypertension Hyperlipidemia Mild anemia Hypokalemia treated AKI, resolved Hyperglycemia secondary to stress induced/steroid-induced Hypomagnesemia, resolved Hypotension, shock likely secondary to medication effect, sedation and paralysis, resolved Leukocytosis, secondary to stress and steroids  Plan:   Extubate today  Titrate O2 for sat of 88-92%  Active diureses again today with Lasix 40 mg IV q8 x2 doses  Replace K  SLP  OOB to chair  IS  Flutter valve  PT  CT to water seal  D/C amiodarone  Maintain solumedrol on 40 mg IV q8 hours for now, if stable in AM will decrease to 40 q12 then switch to PO with a  taper  ISS and CBGs  Maintain off paralytics  D/C pressors and sedation  Disposition / Summary of Today's Plan 12/25/17   Wean per  protocol Was actively diuresed -1.1 L  Best Practice / Goals of Care / Disposition.   DVT prophylaxis: scd, Lovenox GI prophylaxis: PPI Diet: NPO Mobility:BR Code Status: full code Family Communication: 12/23/2017 family updated at bedside.  Labs   CBC: Recent Labs  Lab 12/20/17 0256 12/21/17 0254 12/22/17 0500 12/23/17 0417 12/23/17 1150 12/24/17 0455 12/25/17 0500  WBC 24.3* 9.5 7.5 9.9 11.0* 7.9 9.1  NEUTROABS 21.4* 8.2* 6.5 8.4*  --  6.7  --   HGB 11.6* 9.6* 11.0* 10.1* 10.5* 10.1* 10.8*  HCT 37.3 30.6* 34.9* 32.6* 33.6* 33.6* 35.7*  MCV 91.6 89.5 88.6 89.3 89.6 91.3 92.2  PLT 250 154 110* 144* 176 157 564   Basic Metabolic Panel: Recent Labs  Lab 12/22/17 0500 12/22/17 1639 12/23/17 1150 12/24/17 0455 12/25/17 0500  NA 148* 148* 151* 152* 154*  K 4.1 3.0* 3.9 4.3 4.0  CL 113* 110 110 112* 111  CO2 27 29 32 30 32  GLUCOSE 183* 175* 149* 222* 188*  BUN 72* 71* 73* 74* 73*  CREATININE 1.40* 1.24* 1.20* 1.15* 1.05*  CALCIUM 8.6* 8.7* 9.1 8.9 9.0  MG 2.6* 2.3 2.3 2.4 2.3  PHOS 2.3* 2.2* 2.8 2.8 3.8   GFR: Estimated Creatinine Clearance: 45.1 mL/min (A) (by C-G formula based on SCr of 1.05 mg/dL (H)). Recent Labs  Lab 12/19/17 1301 12/19/17 1526 12/20/17 0000  12/20/17 0256 12/21/17 0254  12/23/17 0417 12/23/17 1150 12/24/17 0455 12/25/17 0500  PROCALCITON  --  <  0.10  --   --  0.21 0.12  --   --   --   --   --   WBC  --   --   --    < > 24.3* 9.5   < > 9.9 11.0* 7.9 9.1  LATICACIDVEN 2.8*  --  1.3  --   --   --   --   --   --   --   --    < > = values in this interval not displayed.   Liver Function Tests: Recent Labs  Lab 12/20/17 0256  AST 19  ALT 21  ALKPHOS 57  BILITOT 0.5  PROT 6.6  ALBUMIN 2.9*   No results for input(s): LIPASE, AMYLASE in the last 168 hours. No results for input(s): AMMONIA in the last 168 hours. ABG    Component Value Date/Time   PHART 7.491 (H) 12/25/2017 0300   PCO2ART 46.9 12/25/2017 0300   PO2ART 81.0 (L)  12/25/2017 0300   HCO3 35.6 (H) 12/25/2017 0300   TCO2 37 (H) 12/25/2017 0300   O2SAT 96.0 12/25/2017 0300    Coagulation Profile: No results for input(s): INR, PROTIME in the last 168 hours. Cardiac Enzymes: Recent Labs  Lab 12/19/17 1526 12/19/17 1800 12/20/17 0107 12/20/17 1319 12/21/17 0917  CKTOTAL  --   --   --   --  33*  TROPONINI <0.03 <0.03 0.03* <0.03  --    HbA1C: No results found for: HGBA1C CBG: Recent Labs  Lab 12/24/17 1544 12/24/17 1926 12/24/17 2346 12/25/17 0324 12/25/17 0733  GLUCAP 184* 165* 182* 187* 167*    ANA+ 1:320, Speckled  Family updated bedside.  The patient is critically ill with multiple organ systems failure and requires high complexity decision making for assessment and support, frequent evaluation and titration of therapies, application of advanced monitoring technologies and extensive interpretation of multiple databases.   Critical Care Time devoted to patient care services described in this note is  39  Minutes. This time reflects time of care of this signee Dr Jennet Maduro. This critical care time does not reflect procedure time, or teaching time or supervisory time of PA/NP/Med student/Med Resident etc but could involve care discussion time.  Rush Farmer, M.D. [facility] Pulmonary/Critical Care Medicine. Pager: [phone]. After hours pager: [phone].

## 2017-12-26 ENCOUNTER — Inpatient Hospital Stay (HOSPITAL_COMMUNITY): Payer: Medicare Other

## 2017-12-26 DIAGNOSIS — R1310 Dysphagia, unspecified: Secondary | ICD-10-CM

## 2017-12-26 LAB — BASIC METABOLIC PANEL
Anion gap: 10 (ref 5–15)
BUN: 72 mg/dL — ABNORMAL HIGH (ref 8–23)
CALCIUM: 9.2 mg/dL (ref 8.9–10.3)
CO2: 34 mmol/L — AB (ref 22–32)
Chloride: 112 mmol/L — ABNORMAL HIGH (ref 98–111)
Creatinine, Ser: 1.09 mg/dL — ABNORMAL HIGH (ref 0.44–1.00)
GFR calc non Af Amer: 51 mL/min — ABNORMAL LOW (ref >60)
GFR, EST AFRICAN AMERICAN: 59 mL/min — AB (ref >60)
GLUCOSE: 196 mg/dL — AB (ref 70–99)
POTASSIUM: 4.4 mmol/L (ref 3.5–5.1)
Sodium: 156 mmol/L — ABNORMAL HIGH (ref 135–145)

## 2017-12-26 LAB — CBC
HEMATOCRIT: 39.6 % (ref 36.0–46.0)
HEMOGLOBIN: 11.7 g/dL — AB (ref 12.0–15.0)
MCH: 27.5 pg (ref 26.0–34.0)
MCHC: 29.5 g/dL — ABNORMAL LOW (ref 30.0–36.0)
MCV: 93.2 fL (ref 78.0–100.0)
Platelets: 227 10*3/uL (ref 150–400)
RBC: 4.25 MIL/uL (ref 3.87–5.11)
RDW: 16.5 % — ABNORMAL HIGH (ref 11.5–15.5)
WBC: 12.2 10*3/uL — AB (ref 4.0–10.5)

## 2017-12-26 LAB — GLUCOSE, CAPILLARY
GLUCOSE-CAPILLARY: 239 mg/dL — AB (ref 70–99)
GLUCOSE-CAPILLARY: 201 mg/dL — AB (ref 70–99)
Glucose-Capillary: 49 mg/dL — ABNORMAL LOW (ref 70–99)
Glucose-Capillary: 299 mg/dL — ABNORMAL HIGH (ref 70–99)
Glucose-Capillary: 181 mg/dL — ABNORMAL HIGH (ref 70–99)
Glucose-Capillary: 103 mg/dL — ABNORMAL HIGH (ref 70–99)
Glucose-Capillary: 176 mg/dL — ABNORMAL HIGH (ref 70–99)

## 2017-12-26 LAB — PHOSPHORUS: Phosphorus: 4.6 mg/dL (ref 2.5–4.6)

## 2017-12-26 LAB — MAGNESIUM: MAGNESIUM: 2.4 mg/dL (ref 1.7–2.4)

## 2017-12-26 MED ORDER — METHYLPREDNISOLONE SODIUM SUCC 40 MG IJ SOLR
40.0000 mg | Freq: Two times a day (BID) | INTRAMUSCULAR | Status: DC
  Administered 2017-12-26 – 2017-12-27 (×2): 40 mg via INTRAVENOUS
  Filled 2017-12-26 (×2): qty 1

## 2017-12-26 MED ORDER — FREE WATER
300.0000 mL | Status: DC
  Administered 2017-12-26 – 2017-12-28 (×12): 300 mL

## 2017-12-26 MED ORDER — POTASSIUM CHLORIDE 20 MEQ/15ML (10%) PO SOLN
40.0000 meq | Freq: Three times a day (TID) | ORAL | Status: AC
  Administered 2017-12-26 (×2): 40 meq
  Filled 2017-12-26 (×2): qty 30

## 2017-12-26 MED ORDER — DEXTROSE 50 % IV SOLN
INTRAVENOUS | Status: AC
  Administered 2017-12-26: 25 mL
  Filled 2017-12-26: qty 50

## 2017-12-26 MED ORDER — ENOXAPARIN SODIUM 40 MG/0.4ML ~~LOC~~ SOLN
40.0000 mg | SUBCUTANEOUS | Status: DC
  Administered 2017-12-26 – 2017-12-29 (×4): 40 mg via SUBCUTANEOUS
  Filled 2017-12-26 (×5): qty 0.4

## 2017-12-26 MED ORDER — OXYCODONE HCL 5 MG PO TABS
5.0000 mg | ORAL_TABLET | ORAL | Status: DC | PRN
  Administered 2017-12-26 (×2): 5 mg via ORAL
  Filled 2017-12-26 (×2): qty 1

## 2017-12-26 MED ORDER — FUROSEMIDE 10 MG/ML IJ SOLN
40.0000 mg | Freq: Four times a day (QID) | INTRAMUSCULAR | Status: AC
  Administered 2017-12-26 – 2017-12-27 (×3): 40 mg via INTRAVENOUS
  Filled 2017-12-26 (×2): qty 4

## 2017-12-26 NOTE — Progress Notes (Signed)
eLink Physician-Brief Progress Note Patient Name: Tasha [name] DOB: Sep 01, 1950 MRN: [id number]   Date of Service  12/26/2017  HPI/Events of Note  Inadequate pain control with Tylenol  eICU Interventions  Oxycodone 5 - 10 mg via NG tube Q 6 Hrs prn pain not relieved by Tylenol.        Kerry Kass Ogan 12/26/2017, 2:46 AM

## 2017-12-26 NOTE — Care Management Note (Signed)
Case Management Note  Patient Details  Name: Tasha [name] MRN: [id number] Date of Birth: 11/22/1950  Subjective/Objective:  Pt admitted with Resp Failure                  Action/Plan:  PTA from home independent with husband.  Pt has PCP.   Expected Discharge Date:  12/19/17               Expected Discharge Plan:     In-House Referral:  Clinical Social Work  Discharge planning Services  CM Consult  Post Acute Care Choice:    Choice offered to:     DME Arranged:    DME Agency:     HH Arranged:    HH Agency:     Status of Service:     If discussed at [employer], dates discussed:    Additional Comments: 12/26/2017 Pt still has NG tube and Chest tube.  CM will continue to follow for discharge needs Maryclare Labrador, RN 12/26/2017, 4:29 PM

## 2017-12-26 NOTE — Evaluation (Signed)
Clinical/Bedside Swallow Evaluation Patient Details  Name: Tasha [name] MRN: [id number] Date of Birth: Aug 24, 1950  Today's Date: 12/26/2017 Time: SLP Start Time (ACUTE ONLY): 1509 SLP Stop Time (ACUTE ONLY): 1525 SLP Time Calculation (min) (ACUTE ONLY): 16 min  Past Medical History:  Past Medical History:  Diagnosis Date  . Acute respiratory failure ([place])   . CAP (community acquired pneumonia)   . Hyperlipidemia   . Hypertension   . Metabolic syndrome    Past Surgical History:  Past Surgical History:  Procedure Laterality Date  . CESAREAN SECTION     HPI:  67 year old female patient admitted to [facility] 8/27 w/acute hypoxic respiratory failure in setting of bilateral pulmonary infiltrates, working diagnosis of subacute ILD versus organizing pneumonia felt possibly secondary to inhalation injury from antifreeze leak in her car. Rapid response called on 8/30 and pt was intubated, extubated 12/25/17. CXR 12/26/17 with persistent coarse lung markings at both bases consistent with atelectasis or pneumonia.    Assessment / Plan / Recommendation Clinical Impression   Patient presents with increased risk for aspiration given prolonged intubation, pneumonia, and possible baseline dysphagia, as son reports coughing during meals prior to admission. Her voice is aphonic. There are no overt signs of aspiration with ice chips, and vitals remained stable. As SLP progressed to cup sips of thin water, there was no coughing, however respiratory rate increased to low 30s, concerning for decreased airway protection and silent aspiration. Recommend she remain NPO with cortrak, will follow up next date for MBS for objective assessment of swallow function given risk factors for aspiration.    SLP Visit Diagnosis: Dysphagia, unspecified (R13.10)    Aspiration Risk  Moderate aspiration risk;Severe aspiration risk    Diet Recommendation Ice chips PRN after oral care;NPO;Alternative means - temporary   Liquid  Administration via: Spoon Medication Administration: Via alternative means    Other  Recommendations Oral Care Recommendations: Oral care QID;Oral care prior to ice chip/H20 Other Recommendations: Remove water pitcher;Have oral suction available   Follow up Recommendations Other (comment)(tbd)      Frequency and Duration min 2x/week  2 weeks       Prognosis Prognosis for Safe Diet Advancement: Guarded      Swallow Study   General Date of Onset: 12/16/17 HPI: 67 year old female patient admitted to [facility] 8/27 w/acute hypoxic respiratory failure in setting of bilateral pulmonary infiltrates, working diagnosis of subacute ILD versus organizing pneumonia felt possibly secondary to inhalation injury from antifreeze leak in her car. Rapid response called on 8/30 and pt was intubated, extubated 12/25/17. CXR 12/26/17 with persistent coarse lung markings at both bases consistent with atelectasis or pneumonia.  Type of Study: Bedside Swallow Evaluation Previous Swallow Assessment: none in chart Diet Prior to this Study: NPO;NG Tube Temperature Spikes Noted: No Respiratory Status: Nasal cannula History of Recent Intubation: Yes Length of Intubations (days): 7 days Date extubated: 12/25/17 Behavior/Cognition: Alert;Cooperative Oral Cavity Assessment: Dry;Dried secretions Oral Care Completed by SLP: Yes Oral Cavity - Dentition: Missing dentition;Other (Comment)(wears bottom denture, not in place) Vision: Functional for self-feeding Self-Feeding Abilities: Needs assist Patient Positioning: Upright in bed Baseline Vocal Quality: Aphonic Volitional Cough: Weak Volitional Swallow: Able to elicit    Oral/Motor/Sensory Function Overall Oral Motor/Sensory Function: Within functional limits   Ice Chips Ice chips: Within functional limits Presentation: Spoon   Thin Liquid Thin Liquid: Impaired Presentation: Cup;Spoon Pharyngeal  Phase Impairments: Change in Vital Signs    Nectar Thick Nectar  Thick Liquid: Not tested   Honey Thick Honey  Thick Liquid: Not tested   Puree Puree: Not tested   Solid     Solid: Not tested     Deneise Lever, [place], [place] Pathologist Acute Rehabilitation Services Pager: [phone] Office: [phone]  Aliene Altes 12/26/2017,3:48 PM

## 2017-12-26 NOTE — Evaluation (Signed)
Physical Therapy Evaluation Patient Details Name: Tasha [name] MRN: [id number] DOB: 09-19-1950 Today's Date: 12/26/2017   History of Present Illness  Pt is a 67 y.o. female admitted 12/16/17 with acute hypoxic respiratory failure in setting of bilateral pulmonary infiltrates, working diagnosis of subacute ILD versus organizing pneumonia felt possibly secondary to inhalation injury from antifreeze leak in car (initially seen 12/01/17, then admitted to [facility] 12/07/17 treated with broad spec abx). Rapid response called on 8/30 and pt intubated; extubated 9/5. 8/31 pneumothorax on vent with chest tube placement. CXR 9/6 with persistent coarse lung markings consistent with atelectasis or pneumonia. PMH includes HTN, metabolic syndrome.    Clinical Impression  Pt presents with decreased activity tolerance and an overall decrease in functional mobility secondary to above. PTA, pt indep and lives with husband; retired from work. Today, pt able to take steps to chair with RW and maxA to prevent posterior LOB. Limited by c/o dizziness with mobility; BP increased, SpO2 down to 82% on RA, returning to >90% on 3L O2 [place]. Hopeful pt will progress well with mobility and be able to return home with HHPT services. Will follow acutely to address established goals.     Follow Up Recommendations Home health PT;Supervision for mobility/OOB(pending progression)    Equipment Recommendations  Rolling walker with 5" wheels    Recommendations for Other Services       Precautions / Restrictions Precautions Precautions: Fall;Other (comment) Precaution Comments: NGT, chest tube Restrictions Weight Bearing Restrictions: No      Mobility  Bed Mobility Overal bed mobility: Needs Assistance Bed Mobility: Supine to Sit     Supine to sit: Min assist;HOB elevated     General bed mobility comments: MinA for UE support to scoot hips to EOB, cues to use bed rail  Transfers Overall transfer level: Needs  assistance Equipment used: Rolling walker (2 wheeled) Transfers: Sit to/from Stand Sit to Stand: Min assist;+2 safety/equipment         General transfer comment: MinA to assist trunk elevation and steady RW; cues for correct hand placement. Pt with c/o dizziness upon standing   Ambulation/Gait Ambulation/Gait assistance: Mod assist;+2 safety/equipment Gait Distance (Feet): 3 Feet Assistive device: Rolling walker (2 wheeled)   Gait velocity: Decreased   General Gait Details: Took steps from bed to recliner with RW, requiring consistent modA to prevent posterior LOB; pt dizzy throughout therefore further distance limited. BP increased. SpO2 down to 82% on RA, returning >90% on 3L O2 [place]  Stairs            Wheelchair Mobility    Modified Rankin (Stroke Patients Only)       Balance Overall balance assessment: Needs assistance   Sitting balance-Leahy Scale: Fair       Standing balance-Leahy Scale: Poor Standing balance comment: Reliant on UE support and external assist to maintain static standing balance                             Pertinent Vitals/Pain Pain Assessment: No/denies pain Faces Pain Scale: No hurt    Home Living Family/patient expects to be discharged to:: Private residence Living Arrangements: Spouse/significant other Available Help at Discharge: Family;Available 24 hours/day Type of Home: House Home Access: Stairs to enter Entrance Stairs-Rails: Right Entrance Stairs-Number of Steps: 3 Home Layout: One level Home Equipment: None Additional Comments: Lives with husband available for 24/7 support. Son works as Diplomatic Services operational officer for [employer] Level of  Independence: Independent         Comments: Retired from work. Indep with all mobility, stays active inside the house     Hand Dominance        Extremity/Trunk Assessment   Upper Extremity Assessment Upper Extremity Assessment: Generalized weakness    Lower Extremity  Assessment Lower Extremity Assessment: Generalized weakness       Communication   Communication: Prefers language other than English  Cognition Arousal/Alertness: Awake/alert Behavior During Therapy: WFL for tasks assessed/performed Overall Cognitive Status: Difficult to assess                                 General Comments: Following simple commands appropriately throughout session. Nodding yes/no appropriately but barely speaking words      General Comments General comments (skin integrity, edema, etc.): Husband, son, and granddaughter present during session    Exercises     Assessment/Plan    PT Assessment Patient needs continued PT services  PT Problem List Decreased strength;Decreased activity tolerance;Decreased balance;Decreased mobility;Decreased knowledge of use of DME;Cardiopulmonary status limiting activity       PT Treatment Interventions DME instruction;Gait training;Stair training;Functional mobility training;Therapeutic activities;Therapeutic exercise;Balance training;Patient/family education    PT Goals (Current goals can be found in the Care Plan section)  Acute Rehab PT Goals Patient Stated Goal: Regain strength and return home PT Goal Formulation: With patient/family Time For Goal Achievement: 01/09/18 Potential to Achieve Goals: Good    Frequency Min 3X/week   Barriers to discharge        Co-evaluation               AM-PAC PT "6 Clicks" Daily Activity  Outcome Measure Difficulty turning over in bed (including adjusting bedclothes, sheets and blankets)?: A Little Difficulty moving from lying on back to sitting on the side of the bed? : Unable Difficulty sitting down on and standing up from a chair with arms (e.g., wheelchair, bedside commode, etc,.)?: Unable Help needed moving to and from a bed to chair (including a wheelchair)?: A Lot Help needed walking in hospital room?: A Lot Help needed climbing 3-5 steps with a  railing? : A Lot 6 Click Score: 11    End of Session Equipment Utilized During Treatment: Gait belt;Oxygen Activity Tolerance: Patient tolerated treatment well;Patient limited by fatigue Patient left: in chair;with call bell/phone within reach;with family/visitor present;with nursing/sitter in room Nurse Communication: Mobility status PT Visit Diagnosis: Other abnormalities of gait and mobility (R26.89);Muscle weakness (generalized) (M62.81)    Time: 7017-7939 PT Time Calculation (min) (ACUTE ONLY): 26 min   Charges:   PT Evaluation $PT Eval Moderate Complexity: 1 Mod PT Treatments $Therapeutic Activity: 8-22 mins       Mabeline Caras, PT, DPT Acute Rehabilitation Services  Pager [phone] Office [place] 12/26/2017, 4:36 PM

## 2017-12-26 NOTE — Progress Notes (Addendum)
Tasha [name]  BJY:[id number] DOB: 1951-04-05 DOA: 12/16/2017 PCP: Dettinger, Fransisca Kaufmann, MD    LOS: 10 days   Reason for Consult / Chief Complaint:  Acute Hypoxic respiratory failure   Consulting MD and date:  [place]   HPI/Summary of hospital stay:  67 year old female patient admitted to [facility] 8/27 w/acute hypoxic respiratory failure in setting of bilateral pulmonary infiltrates, working diagnosis of subacute ILD versus organizing pneumonia felt possibly secondary to inhalation injury from antifreeze leak  in her car (initially seen and treated w/ Doxy 8/12; seen again & switched to levaquin 8/16 & admitted 8/18 to [facility] treated w/ broad spec abx as well as antifungals). [facility] on 8/27: Pulm consulted and  Autoimmune panel,  hypersensitivity pneumonitis panel, strep and Legionella antigen sent. 8/28: CT chest showing extensive consolidative patchy airspace disease with groundglass attenuation and mild bronchiectasis throughout both bases.  No honeycombing.  Had elevated BNP, versus diuresis.  TTE normal with exception of mild pulmonary hypertension.  Treated with broad-spectrum antibiotics.  Transition to high flow oxygen. 8/29: ID work-up negative, no fever or white cell count.  Respiratory virus, urine strep and Legionella negative. Sed rate elevated desaturating but of high flow oxygen down to the 70s.  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  Antibiotics stopped.  High-dose steroids initiated (250 mg q 6).  8/30: Rapid response called overnight.  Received  extra Lasix.  Transitioned to noninvasive positive pressure ventilation.  Moving her to the intensive care given increased WOB, intubated and bronched  8/31: PTX on vent, Right chest drain placed  9/1: steroid taper started, dropped to 60mg  Q6h solumedrol. Called to the bedside mid-morning rounding. Patient in rapid SVT... Given metoprolol to slow down. ECG +Afib  RVR 12/23/2017 currently sinus rhythm on amiodarone drip.  Tolerating pressure support. 12/26/2017:Hypernatremia NA 156>> Fluid deficit 3.3 L, Free water increased, Steroid tapered to 40 Q 12, Chest tube to Water seal as CXR shows resolution of pneumo,   Subjective:  Stable this AM, on 3 L [place] with saturations of 98%. Complaining of CT site  pain and inability to sleep.  Objective   Blood pressure (!) 118/57, pulse 81, temperature 98.6 F (37 C), temperature source Bladder, resp. rate 15, height 5\' 1"  (1.549 m), weight 65.4 kg, SpO2 99 %. CVP:  [0 mmHg-7 mmHg] 1 mmHg  FiO2 (%):  [32 %] 32 %   Intake/Output Summary (Last 24 hours) at 12/26/2017 1039 Last data filed at 12/26/2017 0800 Gross per 24 hour  Intake 1950 ml  Output 1845 ml  Net 105 ml   Filed Weights   12/24/17 0500 12/25/17 0455 12/26/17 0400  Weight: 67.4 kg 65.7 kg 65.4 kg   Examination: General: Chronically ill appearing female, NAD on 3 L [place] with sats of 98% HEENT: [place]/AT, PERRL, no EOM Neuro: Awake and alert, following commands, MAE x 4,appropriate with family (Language barrier) CV: RRR, Nl S1/S2 and -M/R/G. Few PAC's per tele PULM: Coarse BS diffusely, decreased per bases, Right CT to 20 cm suction, no leak GI: Soft, NT, ND and +BS, Tube feeds infusing Extremities: 1+ edema and -tenderness, warm and dry  Skin: no rashes or lesions, intact, warm and dry  Consults: date of consult/date signed off & final recs:  Pulmonary consulted on 8/27  Procedures: 8/31 - Right pigtail catheter, chest for PTx   Significant Diagnostic Tests: 8/29 TEE: EF 65-70% with no wall motion abnormality grade 1 diastolic dysfunction 0/86:  High-resolution CT chest showing bilateral lower lobe consolidation with groundglass attenuation  Auto-immune labs on 8/27 and 8/28:  ACE level less than 15  rheumatoid factor normal at 10.6.   Sed rate elevated at 69.  Procalcitonin less than 0.10. ANA: Positive Anti-DNA antibody negative [place] a  and B antibodies both negative CCP antibodies negative Anti-scleroderma antibody negative MPO/PR-3: neg Respiratory viral panel: Negative FANA staining: Elevated 1: 320 CRP elevated at 6.9 Hypersensitivity pneumonitis panel still pending>>> Histone Ab >>> Pending   Micro Data: Urine strep 8/28-negative Urine Legionella 8/28-negative  Antimicrobials:  Received full course of therapy at outside hospital with initially Rocephin and azithromycin, later changed to Zosyn and a azithromycin.   Cefepime 8/27 to 8/28 Vancomycin 8/27 to 8/28.  Microbial stopped at this time  Resolved Hospital Problem list    Assessment & Plan:   Acute hypoxic respiratory failure in the setting of diffuse consolidative pulmonary infiltrates predominantly in the bases.  Working diagnosis of BOOP/COP Possibly due to inhalation injury from antifreeze or coolant per family, seems unlikely. Idiopathic ARDS Respiratory acidosis Right-sided pneumothorax 8/31, subcutaneous emphysema likely secondary to barotrauma and poor lung compliance in the setting of ARDS. - CXR daily while CT in place - ANA+ 1:320 [place]  - Resp viral panel negative  - Systemic steroid pulse initiated 8/29, taper started 9/1, will decrease to 40 Q 12 9/6 - ACE inhibitor discontinued 8/29 - Antihistone antibodies pending  Leukocytosis Slight bump overnight 9/6 T Max >> 99.5 Following off ABX  For now Plan: Follow Fever Curve and WBC CBC daily CXR prn Culture as clinically indicates  New onset atrial fibrillation with RVR associated hypotension and hemodynamic compromise,, resolved with rate control on amiodarone. Few PAC's noted 9/6  Cumulative fluid balance, - 600cc's Lasix 9/5 40 mg x 2 doses  Hypertension Hyperlipidemia Mild anemia Hypernatremia Hypokalemia treated AKI, resolved Hyperglycemia secondary to stress induced/steroid-induced Hypomagnesemia, resolved Hypotension, shock likely secondary to medication  effect, sedation and paralysis, resolved Leukocytosis, secondary to stress and steroids  Plan:   Extubated 9/5>> Now 3 L [place] with sats of 98%  Titrate O2 for sat of 88-92%  CT remains at 20 cm suction Minimal output CXR now and in am 9/7 Consider placing to water seal if CXR improved   Diuresed 9/5 Creatinine 1.09 Net negative 600 cc's Consider additional dose lasix x 1 9/6>> CXR shows Cardiomegaly without pulmonary vascular congestion.  Hypernatremic >> 156 Fluid deficit 3.3 L>> will replace 1/2 or 1700 cc's with free water 9/6 Please reassess and decrease on 9/7 if improving  Replete electrolytes prn  SLP>> ordered 9/6  OOB to chair  IS Q 1 While awake  Flutter valve  PT  CT to water seal 9/6>> Consider discontinuing 9.7 if CXR shows continued resolution of pneumo  D/C amiodarone  Decreased  solumedrol on 40 mg IV q 12  hours for 9/6, if stable in AM will  switch to PO with a  taper  ISS and CBGs>> suspect hypoglycemic event this am was inaccurate reading Now resolved  Maintain off paralytics  D/C pressors and sedation  Disposition / Summary of Today's Plan 12/26/17   Continue weaning steroids>> 40 BID Will increase free water for Na 156 and free water deficit 3.3 L ( Will replace half) Was actively diuresed 9/5 Chest Tube to water seal 9/6  Best Practice / Goals of Care / Disposition.   DVT prophylaxis: scd, Lovenox GI prophylaxis: PPI Diet: Tube Feeds at Goal Mobility:BR Code Status:  full code Family Communication: 12/26/2017 family updated at bedside.  Labs   CBC: Recent Labs  Lab 12/20/17 0256 12/21/17 0254 12/22/17 0500 12/23/17 0417 12/23/17 1150 12/24/17 0455 12/25/17 0500 12/26/17 0407  WBC 24.3* 9.5 7.5 9.9 11.0* 7.9 9.1 12.2*  NEUTROABS 21.4* 8.2* 6.5 8.4*  --  6.7  --   --   HGB 11.6* 9.6* 11.0* 10.1* 10.5* 10.1* 10.8* 11.7*  HCT 37.3 30.6* 34.9* 32.6* 33.6* 33.6* 35.7* 39.6  MCV 91.6 89.5 88.6 89.3 89.6 91.3 92.2 93.2  PLT 250  154 110* 144* 176 157 186 815   Basic Metabolic Panel: Recent Labs  Lab 12/22/17 1639 12/23/17 1150 12/24/17 0455 12/25/17 0500 12/26/17 0407  NA 148* 151* 152* 154* 156*  K 3.0* 3.9 4.3 4.0 4.4  CL 110 110 112* 111 112*  CO2 29 32 30 32 34*  GLUCOSE 175* 149* 222* 188* 196*  BUN 71* 73* 74* 73* 72*  CREATININE 1.24* 1.20* 1.15* 1.05* 1.09*  CALCIUM 8.7* 9.1 8.9 9.0 9.2  MG 2.3 2.3 2.4 2.3 2.4  PHOS 2.2* 2.8 2.8 3.8 4.6   GFR: Estimated Creatinine Clearance: 43.3 mL/min (A) (by C-G formula based on SCr of 1.09 mg/dL (H)). Recent Labs  Lab 12/19/17 1301 12/19/17 1526 12/20/17 0000  12/20/17 0256 12/21/17 0254  12/23/17 1150 12/24/17 0455 12/25/17 0500 12/26/17 0407  PROCALCITON  --  <0.10  --   --  0.21 0.12  --   --   --   --   --   WBC  --   --   --    < > 24.3* 9.5   < > 11.0* 7.9 9.1 12.2*  LATICACIDVEN 2.8*  --  1.3  --   --   --   --   --   --   --   --    < > = values in this interval not displayed.   Liver Function Tests: Recent Labs  Lab 12/20/17 0256  AST 19  ALT 21  ALKPHOS 57  BILITOT 0.5  PROT 6.6  ALBUMIN 2.9*   No results for input(s): LIPASE, AMYLASE in the last 168 hours. No results for input(s): AMMONIA in the last 168 hours. ABG    Component Value Date/Time   PHART 7.491 (H) 12/25/2017 0300   PCO2ART 46.9 12/25/2017 0300   PO2ART 81.0 (L) 12/25/2017 0300   HCO3 35.6 (H) 12/25/2017 0300   TCO2 37 (H) 12/25/2017 0300   O2SAT 96.0 12/25/2017 0300    Coagulation Profile: No results for input(s): INR, PROTIME in the last 168 hours. Cardiac Enzymes: Recent Labs  Lab 12/19/17 1526 12/19/17 1800 12/20/17 0107 12/20/17 1319 12/21/17 0917  CKTOTAL  --   --   --   --  33*  TROPONINI <0.03 <0.03 0.03* <0.03  --    HbA1C: No results found for: HGBA1C CBG: Recent Labs  Lab 12/25/17 2355 12/26/17 0418 12/26/17 0442 12/26/17 0615 12/26/17 0741  GLUCAP 180* 49* 299* 239* 201*    ANA+ 1:320, [place]ckled  Family updated  bedside.9/6  Magdalen Spatz, AGACNP-BC [place]. Pager: [phone]. After hours pager: [phone]. 12/26/2017 11:28 AM  Attending Note:  67 year old female with acute hypoxemic respiratory failure who presents to the hospital with the diagnosis of BOOP and associated intubation.  Patient was extubated but remains hypoxemic.  Overnight, remains hypoxemic and asking for food.  On exam, coarse BS diffusely.  I reviewed CXR myself, CT is in a good  position without PTX.  Discussed with PCCM-NP.  Hypoxemic respiratory failure:  - Monitor for airway protection  - O2 for sat of 88-92%  - Diureses as below  Dysphagia:  - SLP today  BOOP:  - Decrease solumedrol to 40 mg IV q12  - Change to Prednisone 40 mg PO BID in AM if remains stable  Hypoxemia:  - Titrate O2 for sat of 88-92%  - May need an ambulatory desaturation study when able to ambulate for home O2  Pulmonary edema:  - Lasix 40 mg IV q6 x3 doses  PCCM will continue to follow.  Patient seen and examined, agree with above note.  I dictated the care and orders written for this patient under my direction.  Rush Farmer, [place]

## 2017-12-27 ENCOUNTER — Inpatient Hospital Stay (HOSPITAL_COMMUNITY): Payer: Medicare Other

## 2017-12-27 LAB — CBC
HEMATOCRIT: 38 % (ref 36.0–46.0)
Hemoglobin: 11.5 g/dL — ABNORMAL LOW (ref 12.0–15.0)
MCH: 28.1 pg (ref 26.0–34.0)
MCHC: 30.3 g/dL (ref 30.0–36.0)
MCV: 92.9 fL (ref 78.0–100.0)
PLATELETS: 214 10*3/uL (ref 150–400)
RBC: 4.09 MIL/uL (ref 3.87–5.11)
RDW: 16 % — AB (ref 11.5–15.5)
WBC: 12.5 10*3/uL — AB (ref 4.0–10.5)

## 2017-12-27 LAB — MAGNESIUM: MAGNESIUM: 2.3 mg/dL (ref 1.7–2.4)

## 2017-12-27 LAB — BASIC METABOLIC PANEL
Anion gap: 9 (ref 5–15)
BUN: 61 mg/dL — AB (ref 8–23)
CALCIUM: 9 mg/dL (ref 8.9–10.3)
CO2: 34 mmol/L — ABNORMAL HIGH (ref 22–32)
Chloride: 110 mmol/L (ref 98–111)
Creatinine, Ser: 0.96 mg/dL (ref 0.44–1.00)
GFR calc Af Amer: >60 mL/min (ref >60)
GFR, EST NON AFRICAN AMERICAN: 60 mL/min — AB (ref >60)
Glucose, Bld: 175 mg/dL — ABNORMAL HIGH (ref 70–99)
POTASSIUM: 4.5 mmol/L (ref 3.5–5.1)
Sodium: 153 mmol/L — ABNORMAL HIGH (ref 135–145)

## 2017-12-27 LAB — GLUCOSE, CAPILLARY
GLUCOSE-CAPILLARY: 141 mg/dL — AB (ref 70–99)
GLUCOSE-CAPILLARY: 169 mg/dL — AB (ref 70–99)
GLUCOSE-CAPILLARY: 176 mg/dL — AB (ref 70–99)
GLUCOSE-CAPILLARY: 201 mg/dL — AB (ref 70–99)
Glucose-Capillary: 175 mg/dL — ABNORMAL HIGH (ref 70–99)
Glucose-Capillary: 180 mg/dL — ABNORMAL HIGH (ref 70–99)

## 2017-12-27 LAB — PHOSPHORUS: PHOSPHORUS: 4.6 mg/dL (ref 2.5–4.6)

## 2017-12-27 MED ORDER — RESOURCE THICKENUP CLEAR PO POWD
ORAL | Status: DC | PRN
  Filled 2017-12-27: qty 125

## 2017-12-27 MED ORDER — PREDNISONE 20 MG PO TABS
50.0000 mg | ORAL_TABLET | Freq: Two times a day (BID) | ORAL | Status: DC
  Administered 2017-12-27 – 2017-12-28 (×3): 50 mg via ORAL
  Filled 2017-12-27 (×2): qty 2
  Filled 2017-12-27: qty 1

## 2017-12-27 NOTE — Progress Notes (Signed)
Tasha [name]  QMV:[id number] DOB: 1951/01/01 DOA: 12/16/2017 PCP: Dettinger, Fransisca Kaufmann, MD    LOS: 11 days   Reason for Consult / Chief Complaint:  Acute Hypoxic respiratory failure   Consulting MD and date:  Yates   HPI/Summary of hospital stay:  67 year old female patient admitted to [facility] 8/27 w/acute hypoxic respiratory failure in setting of bilateral pulmonary infiltrates, working diagnosis of subacute ILD versus organizing pneumonia felt possibly secondary to inhalation injury from antifreeze leak  in her car (initially seen and treated w/ Doxy 8/12; seen again & switched to levaquin 8/16 & admitted 8/18 to [facility] treated w/ broad spec abx as well as antifungals). [facility] on 8/27: Pulm consulted and  Autoimmune panel,  hypersensitivity pneumonitis panel, strep and Legionella antigen sent. 8/28: CT chest showing extensive consolidative patchy airspace disease with groundglass attenuation and mild bronchiectasis throughout both bases.  No honeycombing.  Had elevated BNP, versus diuresis.  TTE normal with exception of mild pulmonary hypertension.  Treated with broad-spectrum antibiotics.  Transition to high flow oxygen. 8/29: ID work-up negative, no fever or white cell count.  Respiratory virus, urine strep and Legionella negative. Sed rate elevated desaturating but of high flow oxygen down to the 70s.  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  Antibiotics stopped.  High-dose steroids initiated (250 mg q 6).  8/30: Rapid response called overnight.  Received  extra Lasix.  Transitioned to noninvasive positive pressure ventilation.  Moving her to the intensive care given increased WOB, intubated and bronched  8/31: PTX on vent, Right chest drain placed  9/1: steroid taper started, dropped to 60mg  Q6h solumedrol. Called to the bedside mid-morning rounding. Patient in rapid SVT... Given metoprolol to slow down. ECG +Afib  RVR 12/23/2017 currently sinus rhythm on amiodarone drip.  Tolerating pressure support. 12/26/2017:Hypernatremia NA 156>> Fluid deficit 3.3 L, Free water increased, Steroid tapered to 40 Q 12, Chest tube to Water seal as CXR shows resolution of pneumo,   Subjective:  3L at rest and 5L with ambulation Ambulating this AM No events overnight  Objective   Blood pressure 129/75, pulse 80, temperature 98.1 F (36.7 C), temperature source Axillary, resp. rate 14, height 5\' 1"  (1.549 m), weight 66 kg, SpO2 100 %. CVP:  [0 mmHg-6 mmHg] 6 mmHg      Intake/Output Summary (Last 24 hours) at 12/27/2017 0859 Last data filed at 12/27/2017 0800 Gross per 24 hour  Intake 2760 ml  Output 1575 ml  Net 1185 ml   Filed Weights   12/25/17 0455 12/26/17 0400 12/27/17 0500  Weight: 65.7 kg 65.4 kg 66 kg   Examination: General: Chronically ill appearing female, NAD on 3 L [place] with sats of 98% HEENT: [place]/AT, PERRL, no EOM Neuro: Awake and alert, following commands, MAE x 4,appropriate with family (Language barrier) CV: RRR, Nl S1/S2 and -M/R/G. Few PAC's per tele PULM: Coarse BS diffusely, decreased per bases, Right CT to 20 cm suction, no leak GI: Soft, NT, ND and +BS, Tube feeds infusing Extremities: 1+ edema and -tenderness, warm and dry  Skin: no rashes or lesions, intact, warm and dry  Consults: date of consult/date signed off & final recs:  Pulmonary consulted on 8/27  Procedures: 8/31 - Right pigtail catheter, chest for PTx   Significant Diagnostic Tests: 8/29 TEE: EF 65-70% with no wall motion abnormality grade 1 diastolic dysfunction 2/84: High-resolution CT chest showing bilateral lower lobe consolidation with groundglass attenuation  Auto-immune labs  on 8/27 and 8/28:  ACE level less than 15  rheumatoid factor normal at 10.6.   Sed rate elevated at 69.  Procalcitonin less than 0.10. ANA: Positive Anti-DNA antibody negative [place] a and B antibodies both negative CCP antibodies  negative Anti-scleroderma antibody negative MPO/PR-3: neg Respiratory viral panel: Negative FANA staining: Elevated 1: 320 CRP elevated at 6.9 Hypersensitivity pneumonitis panel still pending>>> Histone Ab >>> Pending   Micro Data: Urine strep 8/28-negative Urine Legionella 8/28-negative  Antimicrobials:  Received full course of therapy at outside hospital with initially Rocephin and azithromycin, later changed to Zosyn and a azithromycin.   Cefepime 8/27 to 8/28 Vancomycin 8/27 to 8/28.  Microbial stopped at this time  Resolved Hospital Problem list    Assessment & Plan:   Acute hypoxic respiratory failure in the setting of diffuse consolidative pulmonary infiltrates predominantly in the bases.  Working diagnosis of BOOP/COP Possibly due to inhalation injury from antifreeze or coolant per family, seems unlikely. Idiopathic ARDS Respiratory acidosis Right-sided pneumothorax 8/31, subcutaneous emphysema likely secondary to barotrauma and poor lung compliance in the setting of ARDS. - D/C chest tube, no PTX - ANA+ 1:320 [place]  - Resp viral panel negative  - Systemic steroid pulse initiated 8/29, taper started 9/1, will decrease to 40 Q 12 9/6 - ACE inhibitor discontinued 8/29 - Antihistone antibodies pending - Change solumedrol to 50 mg PO BID and maintain for now while hypoxemic  Leukocytosis Slight bump overnight 9/6 T Max >> 99.5 Following off ABX  For now Plan: - Follow Fever Curve and WBC - CBC daily - CXR prn - Culture as clinically indicates  New onset atrial fibrillation with RVR associated hypotension and hemodynamic compromise,, resolved with rate control on amiodarone. Few PAC's noted 9/6  Hypertension Hyperlipidemia Mild anemia Hypernatremia Hypokalemia treated AKI, resolved Hyperglycemia secondary to stress induced/steroid-induced Hypomagnesemia, resolved Hypotension, shock likely secondary to medication effect, sedation and paralysis,  resolved Leukocytosis, secondary to stress and steroids  Plan:   Extubated 9/5>> Now 3 L [place] with sats of 98% and 5L with activity  Titrate O2 for sat of 88-92%  Remove CT, I reviewed CXR myself, no PTX noted and no leak  Lasix 40 mg IV q8 x2 doses  KVO IVF  Free water 300 mg Q4 hours  BMET in AM  Replete electrolytes prn  SLP>> ordered 9/7 at 10:30  OOB to chair  IS Q 1 While awake  Flutter valve  PT  D/C amiodarone  Maintain off paralytics  D/C pressors and sedation  Discussed with TRH-MD and PCCM-NP.  Disposition / Summary of Today's Plan 12/27/17   Continue weaning steroids>> 40 BID Will increase free water for Na 156 and free water deficit 3.3 L ( Will replace half) Was actively diuresed 9/5 Chest Tube to water seal 9/6  Best Practice / Goals of Care / Disposition.   DVT prophylaxis: scd, Lovenox GI prophylaxis: PPI Diet: Tube Feeds at Goal Mobility:BR Code Status: full code Family Communication: 12/26/2017 family updated at bedside.  Labs   CBC: Recent Labs  Lab 12/21/17 0254 12/22/17 0500 12/23/17 0417 12/23/17 1150 12/24/17 0455 12/25/17 0500 12/26/17 0407 12/27/17 0558  WBC 9.5 7.5 9.9 11.0* 7.9 9.1 12.2* 12.5*  NEUTROABS 8.2* 6.5 8.4*  --  6.7  --   --   --   HGB 9.6* 11.0* 10.1* 10.5* 10.1* 10.8* 11.7* 11.5*  HCT 30.6* 34.9* 32.6* 33.6* 33.6* 35.7* 39.6 38.0  MCV 89.5 88.6 89.3 89.6 91.3 92.2 93.2 92.9  PLT 154 110* 144* 176 157 186 227 400   Basic Metabolic Panel: Recent Labs  Lab 12/23/17 1150 12/24/17 0455 12/25/17 0500 12/26/17 0407 12/27/17 0558  NA 151* 152* 154* 156* 153*  K 3.9 4.3 4.0 4.4 4.5  CL 110 112* 111 112* 110  CO2 32 30 32 34* 34*  GLUCOSE 149* 222* 188* 196* 175*  BUN 73* 74* 73* 72* 61*  CREATININE 1.20* 1.15* 1.05* 1.09* 0.96  CALCIUM 9.1 8.9 9.0 9.2 9.0  MG 2.3 2.4 2.3 2.4 2.3  PHOS 2.8 2.8 3.8 4.6 4.6   GFR: Estimated Creatinine Clearance: 49.5 mL/min (by C-G formula based on SCr of 0.96  mg/dL). Recent Labs  Lab 12/21/17 0254  12/24/17 0455 12/25/17 0500 12/26/17 0407 12/27/17 0558  PROCALCITON 0.12  --   --   --   --   --   WBC 9.5   < > 7.9 9.1 12.2* 12.5*   < > = values in this interval not displayed.   Liver Function Tests: No results for input(s): AST, ALT, ALKPHOS, BILITOT, PROT, ALBUMIN in the last 168 hours. No results for input(s): LIPASE, AMYLASE in the last 168 hours. No results for input(s): AMMONIA in the last 168 hours. ABG    Component Value Date/Time   PHART 7.491 (H) 12/25/2017 0300   PCO2ART 46.9 12/25/2017 0300   PO2ART 81.0 (L) 12/25/2017 0300   HCO3 35.6 (H) 12/25/2017 0300   TCO2 37 (H) 12/25/2017 0300   O2SAT 96.0 12/25/2017 0300    Coagulation Profile: No results for input(s): INR, PROTIME in the last 168 hours. Cardiac Enzymes: Recent Labs  Lab 12/20/17 1319 12/21/17 0917  CKTOTAL  --  33*  TROPONINI <0.03  --    HbA1C: No results found for: HGBA1C CBG: Recent Labs  Lab 12/26/17 1158 12/26/17 1554 12/26/17 2013 12/27/17 0026 12/27/17 0430  GLUCAP 181* 103* 176* 176* 175*    ANA+ 1:320, Speckled  Rush Farmer, M.D. [facility] Pulmonary/Critical Care Medicine. Pager: [phone]. After hours pager: [phone]
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
## 2017-12-27 NOTE — Progress Notes (Addendum)
Rehab Admissions Coordinator Note:  Patient was screened by Cleatrice Burke for appropriateness for an Inpatient Acute Rehab Consult per PT recommendation.   At this time, we are recommending Inpatient Rehab consult. Please place an order if pt would like to be considered for admit.  Cleatrice Burke 12/27/2017, 2:29 PM  I can be reached at [phone].

## 2017-12-27 NOTE — Progress Notes (Signed)
Modified Barium Swallow Progress Note  Patient Details  Name: Tasha [name] MRN: [id number] Date of Birth: [birth year]  Today's Date: 12/27/2017  Modified Barium Swallow completed.  Full report located under Chart Review in the Imaging Section.  Brief recommendations include the following:  Clinical Impression  Patient presents with what appears to be an acute, reversible mild-moderate oropharyngeal dysphagia. Oral stage is prolonged with solids with mild-moderate lingual residue which appears to be due to xerostomia vs weakness. Pharyngeal stage noted for decreased epiglottic deflection, given reduced hyolarygneal excursion and presence of cortrak. There is trace penetration and silent aspiration of thin liquid during the swallow as a result, and penetration of nectar. Chin tuck eliminates penetration of nectar but not thin liquids due to premature spillage to the pyriforms before the swallow. Mild vallecular residue with dry solid which reduces with volitional dry swallow. Barium tablet lodged in the proximal esophagus and did not clear with 3 oz nectar wash. Ultimately passed into the stomach aided by a bolus of puree. Pt's son denies knowlegde of any esophageal deficits or stricture; MD may consider further esophageal w/u if appropriate. Would initiate dys 3 (mechanical soft) diet and nectar thick liquids, with chin tuck, meds crushed in puree. Anticipate rapid improvements with additional time post-extubation, increased oral intake and removal of cortrak. Education provided to pt and her sons re: recommendations, precautions and prognosis. Will continue to follow.   Swallow Evaluation Recommendations   Recommended Consults: Consider esophageal assessment   SLP Diet Recommendations: Dysphagia 3 (Mech soft) solids;Nectar thick liquid   Liquid Administration via: Cup;Straw   Medication Administration: Crushed with puree   Supervision: Patient able to self feed;Intermittent supervision to cue  for compensatory strategies   Compensations: Slow rate;Small sips/bites;Follow solids with liquid;Chin tuck   Postural Changes: Seated upright at 90 degrees;Remain semi-upright after after feeds/meals (Comment)   Oral Care Recommendations: Oral care BID   Other Recommendations: Prohibited food (jello, ice cream, thin soups);Remove water pitcher;Order thickener from [place], [place], [place] Pathologist Acute Rehabilitation Services Pager: [phone] Office: [phone]  Aliene Altes 12/27/2017,11:57 AM

## 2017-12-27 NOTE — Progress Notes (Signed)
Patient transferred from [place]. S/P ARDS. Extubated 12/25/17. On Tube feeding. Spanish speaking. Family by the bedside. Oriented to room and surroundings. Used the video  interpretation services to introduce the patient to room and surroundings. Telemetry applied and cCMD called. CHG done.

## 2017-12-28 LAB — CBC
HCT: 35.6 % — ABNORMAL LOW (ref 36.0–46.0)
Hemoglobin: 10.9 g/dL — ABNORMAL LOW (ref 12.0–15.0)
MCH: 27.9 pg (ref 26.0–34.0)
MCHC: 30.6 g/dL (ref 30.0–36.0)
MCV: 91.3 fL (ref 78.0–100.0)
Platelets: 169 10*3/uL (ref 150–400)
RBC: 3.9 MIL/uL (ref 3.87–5.11)
RDW: 15.6 % — AB (ref 11.5–15.5)
WBC: 10.9 10*3/uL — ABNORMAL HIGH (ref 4.0–10.5)

## 2017-12-28 LAB — BASIC METABOLIC PANEL
Anion gap: 7 (ref 5–15)
BUN: 59 mg/dL — AB (ref 8–23)
CALCIUM: 8.4 mg/dL — AB (ref 8.9–10.3)
CO2: 31 mmol/L (ref 22–32)
CREATININE: 0.86 mg/dL (ref 0.44–1.00)
Chloride: 105 mmol/L (ref 98–111)
GFR calc non Af Amer: >60 mL/min (ref >60)
Glucose, Bld: 212 mg/dL — ABNORMAL HIGH (ref 70–99)
Potassium: 4.3 mmol/L (ref 3.5–5.1)
SODIUM: 143 mmol/L (ref 135–145)

## 2017-12-28 LAB — PHOSPHORUS: Phosphorus: 3.7 mg/dL (ref 2.5–4.6)

## 2017-12-28 LAB — GLUCOSE, CAPILLARY
GLUCOSE-CAPILLARY: 187 mg/dL — AB (ref 70–99)
Glucose-Capillary: 117 mg/dL — ABNORMAL HIGH (ref 70–99)
Glucose-Capillary: 126 mg/dL — ABNORMAL HIGH (ref 70–99)
Glucose-Capillary: 139 mg/dL — ABNORMAL HIGH (ref 70–99)
Glucose-Capillary: 174 mg/dL — ABNORMAL HIGH (ref 70–99)
Glucose-Capillary: 199 mg/dL — ABNORMAL HIGH (ref 70–99)
Glucose-Capillary: 76 mg/dL (ref 70–99)

## 2017-12-28 LAB — TSH: TSH: 12.078 u[IU]/mL — AB (ref 0.350–4.500)

## 2017-12-28 LAB — MAGNESIUM: MAGNESIUM: 2.1 mg/dL (ref 1.7–2.4)

## 2017-12-28 MED ORDER — PREDNISONE 20 MG PO TABS
50.0000 mg | ORAL_TABLET | Freq: Every day | ORAL | Status: DC
  Administered 2017-12-29 – 2017-12-30 (×2): 50 mg via ORAL
  Filled 2017-12-28 (×2): qty 2

## 2017-12-28 MED ORDER — PANTOPRAZOLE SODIUM 40 MG PO TBEC
40.0000 mg | DELAYED_RELEASE_TABLET | Freq: Every day | ORAL | Status: DC
  Administered 2017-12-28 – 2017-12-30 (×3): 40 mg via ORAL
  Filled 2017-12-28 (×3): qty 1

## 2017-12-28 MED ORDER — ASPIRIN 81 MG PO CHEW
81.0000 mg | CHEWABLE_TABLET | Freq: Every day | ORAL | Status: DC
  Administered 2017-12-28 – 2017-12-30 (×3): 81 mg via ORAL
  Filled 2017-12-28 (×3): qty 1

## 2017-12-28 MED ORDER — CARVEDILOL 3.125 MG PO TABS
3.1250 mg | ORAL_TABLET | Freq: Two times a day (BID) | ORAL | Status: DC
  Administered 2017-12-28 – 2017-12-29 (×2): 3.125 mg via ORAL
  Filled 2017-12-28 (×4): qty 1

## 2017-12-28 MED ORDER — DOCUSATE SODIUM 100 MG PO CAPS
200.0000 mg | ORAL_CAPSULE | Freq: Two times a day (BID) | ORAL | Status: DC
  Administered 2017-12-28 – 2017-12-30 (×5): 200 mg via ORAL
  Filled 2017-12-28 (×5): qty 2

## 2017-12-28 MED ORDER — PRO-STAT SUGAR FREE PO LIQD
30.0000 mL | Freq: Three times a day (TID) | ORAL | Status: DC
  Administered 2017-12-28 – 2017-12-30 (×5): 30 mL via ORAL
  Filled 2017-12-28 (×5): qty 30

## 2017-12-28 NOTE — Progress Notes (Addendum)
_0 @        PROGRESS NOTE                                                                                                                                                                                                             Patient Demographics:    Tasha [name], is a 67 y.o. female, DOB - 1950/11/24, WUJ:[id number]  Admit date - 12/16/2017   Admitting Physician Karmen Bongo, MD  Outpatient Primary MD for the patient is Dettinger, Fransisca Kaufmann, MD  LOS - 12  CC- SOB  Brief Narrative - 67 year old Hispanic female with history of CAP, dyslipidemia, depression, hypertension, GERD who was initially admitted to [facility] then transferred to [facility] on 12/16/2017, she presented with shortness of breath after a possible accidental antifreeze inhalation, her clinical status quickly worsened and she was transferred to critical care and intubated, further work-up suggested that she might have had ARDS/Boop/ILD, she also developed pneumothorax due to barotrauma and required a chest tube placement which has been removed few days ago and pneumothorax has now resolved, she was also treated with steroids and supportive care which also included some Lasix, during her hospital stay she had a brief run of atrial fibrillation which lasted a few days and eventually resolved after 2 days of amiodarone drip, was eventually extubated and transferred to hospitalist service under my care on 12/28/2017 on 3 L nasal cannula oxygen and NG tube with tube feeding.   Procedures & Major events  :    8/27- intubated  8/29 TEE: EF 65-70% with no wall motion abnormality grade 1 diastolic dysfunction 9/56: High-resolution CT chest showing bilateral lower lobe consolidation with groundglass attenuation 9/5 - extubated  Auto-immune labs on 8/27 and 8/28:  ACE level less than 15  rheumatoid factor normal at 10.6.   Procalcitonin less than 0.10. Anti-DNA antibody negative Sojourner's a and B  antibodies both negative CCP antibodies negative Anti-scleroderma antibody negative MPO/PR-3: neg Respiratory viral panel: Negative CRP elevated at 6.9 Hypersensitivity pneumonitis panel  negative  Sed rate elevated at 69.  FANA staining: Elevated 1: 320 ANA: Positive  Her ID work-up and cultures were negative.    Subjective:    [name] today has, No headache, No chest pain, No abdominal pain - No Nausea, No new weakness tingling or numbness, No Cough - SOB.     Assessment  & Plan :     1.  Acute hypoxic respiratory failure requiring endotracheal intubation for a week -with running diagnosis of  ARDS/Boop possibly caused by antifreeze inhalation injury.  She was intubated for a week and extubated on 12/25/2017, she is currently down to 2 L nasal cannula oxygen and in no distress, she is currently on 50 twice daily of oral prednisone which I have tapered down to 50 daily, patient should get at least 4 to 6-week gradual taper, encouraged to sit up in chair, use flutter valve and I-S for pulmonary toiletry, in no distress clinically much improved, chest x-ray stable.  Will initiate activity and PT.   2.  Brief episode of A. fib with RVR while she was intubated in the ICU for 48 hours.  Mali vas 2 score will be at least 3, this resolved after 48 hours of amiodarone, echocardiogram shows a preserved EF of 60% with no wall motion abnormality, pulmonary artery pressures were slightly elevated at 54 but right atrium is not dilated, I discussed her case with cardiologist Dr. Oval Linsey on 12/28/2017, at this point we will place her on low-dose beta-blocker and aspirin, upon discharge arrange for 30-day Holter monitor to look for paroxysmal A. fib, most likely this was brief atrial fibrillation due to acute pulmonary stress, TSH if anything was borderline high.  3.  GERD.  On PPI.  4.  Dyslipidemia.  Resume home dose statin.  5.  Hypertension.  Will place her on low-dose beta-blocker along  with PRN hydralazine.  6.  Mild steroid-induced hyperglycemia.  Sliding scale.  Does not have diagnosed diabetes mellitus.  7.  Barotrauma induced right-sided pneumothorax.  Required chest tube, chest tube has been removed few days ago, this problem has resolved.  8.  Mild acute on chronic diastolic CHF.  EF 60%.  Resolved after single dose Lasix in the ICU, monitor clinically.  Placed on Coreg.  9. Elevated TSH.  Recent exposure to amiodarone, prudent to repeat TSH in 3 to 4 weeks by PCP along with free T4 and T3.  This could be sick euthyroid syndrome as well.    Family Communication  : son bedside  Code Status :  Full  Disposition Plan  :  [place]  Consults  :  PCCM   DVT Prophylaxis  :  Lovenox   Lab Results  Component Value Date   PLT 169 12/28/2017    Diet :  Diet Order            DIET DYS 3 Room service appropriate? Yes; Fluid consistency: Nectar Thick  Diet effective now               Inpatient Medications Scheduled Meds: . aspirin  81 mg Oral Daily  . chlorhexidine  15 mL Mouth Rinse BID  . Chlorhexidine Gluconate Cloth  6 each Topical Daily  . docusate sodium  200 mg Oral BID  . enoxaparin (LOVENOX) injection  40 mg Subcutaneous Q24H  . feeding supplement (PRO-STAT SUGAR FREE 64)  30 mL Oral TID WC  . insulin aspart  2-6 Units Subcutaneous Q4H  . mouth rinse  15 mL Mouth Rinse q12n4p  . metoprolol tartrate  25 mg Per Tube Q8H  . multivitamin  15 mL Per Tube Daily  . pantoprazole  40 mg Oral Daily  . [START ON 12/29/2017] predniSONE  50 mg Oral Q breakfast  . sodium chloride flush  10-40 mL Intracatheter Q12H  . thiamine  100 mg Per Tube Daily   Continuous Infusions: . sodium chloride     PRN Meds:.sodium chloride, acetaminophen (TYLENOL) oral liquid 160 mg/5 mL, hydrALAZINE, ipratropium-albuterol, [DISCONTINUED]  ondansetron **OR** ondansetron (ZOFRAN) IV, oxyCODONE, RESOURCE THICKENUP CLEAR  Antibiotics  :   Anti-infectives (From admission,  onward)   Start     Dose/Rate Route Frequency Ordered Stop   12/21/17 0300  ceFEPIme (MAXIPIME) 1 g in sodium chloride 0.9 % 100 mL IVPB  Status:  Discontinued     1 g 200 mL/hr over 30 Minutes Intravenous Every 24 hours 12/20/17 1034 12/21/17 0901   12/19/17 1500  ceFEPIme (MAXIPIME) 1 g in sodium chloride 0.9 % 100 mL IVPB  Status:  Discontinued     1 g 200 mL/hr over 30 Minutes Intravenous Every 12 hours 12/19/17 1344 12/20/17 1034   12/16/17 2200  vancomycin (VANCOCIN) IVPB 1000 mg/200 mL premix  Status:  Discontinued     1,000 mg 200 mL/hr over 60 Minutes Intravenous Every 12 hours 12/16/17 2130 12/18/17 0829   12/16/17 1700  ceFEPIme (MAXIPIME) 1 g in sodium chloride 0.9 % 100 mL IVPB  Status:  Discontinued     1 g 200 mL/hr over 30 Minutes Intravenous Every 8 hours 12/16/17 1619 12/18/17 0829          Objective:   Vitals:   12/28/17 0002 12/28/17 0300 12/28/17 0911 12/28/17 1215  BP: 125/72 123/67 127/61 114/68  Pulse: 65 65 89 88  Resp: (!) 21 20 (!) 26 (!) 22  Temp: 98.4 F (36.9 C) 98 F (36.7 C) 98.2 F (36.8 C) 97.7 F (36.5 C)  TempSrc: Oral Oral Oral Oral  SpO2: 100% 99% 91% 99%  Weight:  65.1 kg    Height:        Wt Readings from Last 3 Encounters:  12/28/17 65.1 kg  12/05/17 68.5 kg  12/01/17 68 kg     Intake/Output Summary (Last 24 hours) at 12/28/2017 1236 Last data filed at 12/28/2017 0907 Gross per 24 hour  Intake 2210 ml  Output 500 ml  Net 1710 ml     Physical Exam  Awake Alert, Oriented X 3, No new F.N deficits, Normal affect [place].AT,PERRAL, NG in place Supple Neck,No JVD, No cervical lymphadenopathy appriciated.  Symmetrical Chest wall movement, Good air movement bilaterally, CTAB RRR,No Gallops,Rubs or new Murmurs, No Parasternal Heave +ve B.Sounds, Abd Soft, No tenderness, No organomegaly appriciated, No rebound - guarding or rigidity. No Cyanosis, Clubbing or edema, No new Rash or bruise       Data Review:    CBC Recent Labs   Lab 12/22/17 0500 12/23/17 0417  12/24/17 0455 12/25/17 0500 12/26/17 0407 12/27/17 0558 12/28/17 0441  WBC 7.5 9.9   < > 7.9 9.1 12.2* 12.5* 10.9*  HGB 11.0* 10.1*   < > 10.1* 10.8* 11.7* 11.5* 10.9*  HCT 34.9* 32.6*   < > 33.6* 35.7* 39.6 38.0 35.6*  PLT 110* 144*   < > 157 186 227 214 169  MCV 88.6 89.3   < > 91.3 92.2 93.2 92.9 91.3  MCH 27.9 27.7   < > 27.4 27.9 27.5 28.1 27.9  MCHC 31.5 31.0   < > 30.1 30.3 29.5* 30.3 30.6  RDW 14.7 15.2   < > 15.9* 15.9* 16.5* 16.0* 15.6*  LYMPHSABS 0.4* 0.5*  --  0.4*  --   --   --   --   MONOABS 0.4 0.6  --  0.5  --   --   --   --   EOSABS 0.0 0.0  --  0.0  --   --   --   --   BASOSABS 0.0  0.0  --  0.0  --   --   --   --    < > = values in this interval not displayed.    Chemistries  Recent Labs  Lab 12/24/17 0455 12/25/17 0500 12/26/17 0407 12/27/17 0558 12/28/17 0441  NA 152* 154* 156* 153* 143  K 4.3 4.0 4.4 4.5 4.3  CL 112* 111 112* 110 105  CO2 30 32 34* 34* 31  GLUCOSE 222* 188* 196* 175* 212*  BUN 74* 73* 72* 61* 59*  CREATININE 1.15* 1.05* 1.09* 0.96 0.86  CALCIUM 8.9 9.0 9.2 9.0 8.4*  MG 2.4 2.3 2.4 2.3 2.1   ------------------------------------------------------------------------------------------------------------------ No results for input(s): CHOL, HDL, LDLCALC, TRIG, CHOLHDL, LDLDIRECT in the last 72 hours.  No results found for: HGBA1C ------------------------------------------------------------------------------------------------------------------ No results for input(s): TSH, T4TOTAL, T3FREE, THYROIDAB in the last 72 hours.  Invalid input(s): FREET3 ------------------------------------------------------------------------------------------------------------------ No results for input(s): VITAMINB12, FOLATE, FERRITIN, TIBC, IRON, RETICCTPCT in the last 72 hours.  Coagulation profile No results for input(s): INR, PROTIME in the last 168 hours.  No results for input(s): DDIMER in the last 72  hours.  Cardiac Enzymes No results for input(s): CKMB, TROPONINI, MYOGLOBIN in the last 168 hours.  Invalid input(s): CK ------------------------------------------------------------------------------------------------------------------    Component Value Date/Time   BNP 77.7 12/16/2017 1924   CBG (last 3)  Recent Labs    12/28/17 0443 12/28/17 0905 12/28/17 1221  GLUCAP 199* 187* 126*    Micro Results Recent Results (from the past 240 hour(s))  Culture, bal-quantitative     Status: None   Collection Time: 12/19/17  2:15 PM  Result Value Ref Range Status   Specimen Description BRONCHIAL ALVEOLAR LAVAGE  Final   Special Requests NONE  Final   Gram Stain NO WBC SEEN NO ORGANISMS SEEN   Final   Culture   Final    NO GROWTH 2 DAYS Performed at [facility], [address] [place], [place] [address]    Report Status 12/22/2017 FINAL  Final  Pneumocystis smear by DFA     Status: None   Collection Time: 12/19/17  2:15 PM  Result Value Ref Range Status   Specimen Source-PJSRC BRONCHIAL ALVEOLAR LAVAGE  Final   Pneumocystis jiroveci Ag NEGATIVE  Final    Comment: Performed at [facility] Performed at [facility], [address], [place], [place] [address]   Acid Fast Smear (AFB)     Status: None   Collection Time: 12/19/17  2:15 PM  Result Value Ref Range Status   AFB Specimen Processing MLEAK  Final    Comment: (NOTE) Test Not Performed. Specimen leaked in transit and is no longer suitable for testing.     Waldon Reining notified 12/23/2017    Acid Fast Smear NOT PERFORMED  Final    Comment: (NOTE) Test not performed Performed At: [facility] [place], [place] [id number] Rush Farmer MD [id number]    Source (AFB) BRONCHIAL ALVEOLAR LAVAGE  Final  Acid Fast Culture with reflexed sensitivities     Status: None   Collection Time: 12/19/17  2:15 PM  Result Value Ref Range Status   Acid Fast Culture MLEAK  Final    Comment:  (NOTE) Test Not Performed. Specimen leaked in transit and is no longer suitable for testing.     Waldon Reining notified 12/23/2017 Performed At: [facility] [place], [place] [id number] Rush Farmer MD IH:[id number]    Source of Sample BRONCHIAL  ALVEOLAR LAVAGE  Final  Culture, fungus without smear     Status: None (Preliminary result)   Collection Time: 12/19/17  2:15 PM  Result Value Ref Range Status   Specimen Description BRONCHIAL ALVEOLAR LAVAGE  Final   Special Requests NONE  Final   Culture   Final    NO FUNGUS ISOLATED AFTER 7 DAYS Performed at [facility], [place] [address], [place], [place] [address]    Report Status PENDING  Incomplete  Acid Fast Smear (AFB)     Status: None (Preliminary result)   Collection Time: 12/19/17  7:00 PM  Result Value Ref Range Status   AFB Specimen Processing Not Indicated  Final   Acid Fast Smear Negative  Final    Comment: (NOTE) Performed At: [facility] [address] [place], [place] [id number] Rush Farmer MD XF:[id number]    Source (AFB) PENDING  Incomplete    Radiology Reports Dg Chest 2 View  Result Date: 12/01/2017 CLINICAL DATA:  Cough and shortness of breath for 2 weeks EXAM: CHEST - 2 VIEW COMPARISON:  August 10, 2015 FINDINGS: The heart size and mediastinal contours are within normal limits. Mild increased pulmonary interstitium in is identified bilaterally. Patchy consolidation of bilateral mid lung and lung bases are noted. There is probable minimal right pleural effusion. The bony structures are stable. IMPRESSION: Mild increased pulmonary interstitium with patchy consolidation of bilateral mid and lung bases suggesting infectious or inflammatory etiology. Electronically Signed   By: Abelardo Diesel M.D.   On: 12/01/2017 09:46   Ct Chest High Resolution  Result Date: 12/19/2017 CLINICAL DATA:  Inpatient.  Acute on chronic respiratory failure. EXAM: CT CHEST WITHOUT CONTRAST TECHNIQUE:  Multidetector CT imaging of the chest was performed following the standard protocol without intravenous contrast. High resolution imaging of the lungs, as well as inspiratory and expiratory imaging, was performed. COMPARISON:  None. FINDINGS: Cardiovascular: Top-normal heart size. Trace pericardial effusion/thickening. Left anterior descending and right coronary atherosclerosis. Atherosclerotic nonaneurysmal thoracic aorta. Normal caliber pulmonary arteries. Mediastinum/Nodes: Subcentimeter high left thyroid lobe calcification. Unremarkable esophagus. No pathologically enlarged axillary, mediastinal or hilar lymph nodes, noting limited sensitivity for the detection of hilar adenopathy on this noncontrast study. Coarsely calcified nonenlarged subcarinal and left hilar nodes from prior granulomatous disease. Lungs/Pleura: No pneumothorax. Small dependent bilateral pleural effusions extending into the major fissures, left greater than right. No significant air trapping on the expiration sequence. There is extensive patchy consolidation with air bronchograms throughout both lungs predominantly in right middle lobe and bilateral lower lobes. There is a suggestion of underlying patchy peripheral reticulation and ground-glass attenuation throughout both lungs. There is mild bronchiectasis throughout both lungs, without frank honeycombing. There is a basilar predominance to these findings. No lung masses or significant pulmonary nodules in the limited aerated portions of the lungs. Upper abdomen: Small hiatal hernia.  Colonic diverticulosis. Musculoskeletal: No aggressive appearing focal osseous lesions. Moderate thoracic spondylosis. IMPRESSION: 1. Extensive patchy consolidation with peripheral reticulation, ground-glass attenuation and mild bronchiectasis throughout both lungs, basilar predominant. No frank honeycombing. Findings suggest a multilobar pneumonia potentially superimposed on a chronic basilar predominant  fibrotic interstitial lung disease. Findings would be considered indeterminate for usual interstitial pneumonia (UIP). Follow-up high-resolution chest CT suggested. 2. Small dependent bilateral pleural effusions, left greater than right. 3. Trace pericardial effusion/thickening. 4. Two vessel coronary atherosclerosis. 5. Small hiatal hernia. Aortic Atherosclerosis (ICD10-I70.0). Electronically Signed   By: Ilona Sorrel M.D.   On: 12/19/2017 08:35   Dg Chest Port 1  View  Result Date: 12/27/2017 CLINICAL DATA:  Respiratory failure. EXAM: PORTABLE CHEST 1 VIEW COMPARISON:  Chest x-ray from yesterday. FINDINGS: Unchanged left subclavian central venous catheter with tip in the distal SVC. Unchanged feeding tube entering the stomach with the tip below the field of view. Unchanged right sided pigtail chest tube. Stable cardiomediastinal silhouette unchanged small bilateral pleural effusions and bibasilar atelectasis. No pneumothorax. No acute osseous abnormality. Decreasing subcutaneous emphysema. IMPRESSION: Unchanged bibasilar atelectasis and small bilateral pleural effusions. Electronically Signed   By: Titus Dubin M.D.   On: 12/27/2017 07:36   Dg Chest Port 1 View  Result Date: 12/26/2017 CLINICAL DATA:  Respiratory failure, chest tube on the right for treatment of a pneumothorax. EXAM: PORTABLE CHEST 1 VIEW COMPARISON:  Portable chest x-ray of December 25, 2017 FINDINGS: The degree of subcutaneous emphysema has decreased. No residual pneumothorax is observed. The interstitial markings are both lungs are coarse greatest at the bases. The retrosternal soft tissues remain increased on the left. The cardiac silhouette is enlarged. The pulmonary vascularity is not clearly engorged. There is calcification in the wall of the aortic arch. The left subclavian venous catheter tip projects over the midportion of the SVC. The feeding tube tip projects below the inferior margin of the image. IMPRESSION: No residual  right-sided pneumothorax. The amount of subcutaneous emphysema bilaterally has decreased. Persistent coarse lung markings at both bases consistent with atelectasis or pneumonia. Cardiomegaly without pulmonary vascular congestion. The support tubes are in reasonable position. Thoracic aortic atherosclerosis. Electronically Signed   By: David  Martinique M.D.   On: 12/26/2017 11:29   Dg Chest Port 1 View  Result Date: 12/25/2017 CLINICAL DATA:  Endotracheal tube. EXAM: PORTABLE CHEST 1 VIEW COMPARISON:  Chest x-rays dated 12/24/2017 and 12/23/2017. FINDINGS: Heart size and mediastinal contours are stable. Endotracheal tube is well positioned with tip just above the level of the carina. LEFT-sided central line appears well positioned with tip at the level of the mid/lower SVC. Enteric tube passes below the diaphragm. RIGHT-sided chest tube is stable in position. Probable mild bibasilar atelectasis, stable. No new lung findings. No pneumothorax seen. Stable subcutaneous emphysema overlying the chest walls. IMPRESSION: Stable chest x-ray. Probable mild bibasilar atelectasis. Support apparatus, including endotracheal tube, are stable in position. Electronically Signed   By: Franki Cabot M.D.   On: 12/25/2017 10:16   Dg Chest Port 1 View  Result Date: 12/24/2017 CLINICAL DATA:  Hypoxia EXAM: PORTABLE CHEST 1 VIEW COMPARISON:  December 23, 2017 FINDINGS: Endotracheal tube tip is 3.0 cm above the carina. Central catheter tip is in the superior vena cava. Feeding tube tip is below the diaphragm. There is a chest tube on the right, unchanged in position. No pneumothorax. Extensive subcutaneous air bilaterally remains. There is bibasilar atelectasis. No frank edema or consolidation. Heart size and pulmonary vascularity are normal. There is aortic atherosclerosis. No adenopathy. No bone lesions. IMPRESSION: Tube and catheter positions as described. Extensive subcutaneous air but no pneumothorax evident. Bibasilar atelectasis.  No consolidation. Stable cardiac silhouette. There is aortic atherosclerosis. Aortic Atherosclerosis (ICD10-I70.0). Electronically Signed   By: Lowella Grip III M.D.   On: 12/24/2017 08:04   Dg Chest Port 1 View  Result Date: 12/23/2017 CLINICAL DATA:  Intubation. EXAM: PORTABLE CHEST 1 VIEW COMPARISON:  12/21/2017. FINDINGS: Right chest tube noted over the right lower chest on today's exam. Endotracheal tube, left subclavian line, feeding tube in stable position. Improving bibasilar infiltrates. No prominent pleural effusion. No pneumothorax noted on today's exam. Interim  resolution of right-sided pneumothorax. Improving chest wall subcutaneous emphysema. Heart size stable. IMPRESSION: 1. Right chest tube noted over right lower chest. Interim resolution of right-sided pneumothorax. Improving diffuse chest wall subcutaneous emphysema. 2.  Remaining lines and tubes stable position. 3.  Improving bibasilar pulmonary infiltrates. Electronically Signed   By: Marcello Moores  Register   On: 12/23/2017 09:31   Dg Chest Port 1 View  Result Date: 12/21/2017 CLINICAL DATA:  Follow-up RIGHT pneumothorax and extensive subcutaneous emphysema. EXAM: PORTABLE CHEST 1 VIEW COMPARISON:  12/20/2017, 12/19/2017, 12/18/2017 and CT chest 12/17/2017. FINDINGS: Endotracheal tube tip in satisfactory position projecting approximately 3 cm above the carina. LEFT subclavian central venous catheter tip projects over the mid SVC, unchanged. Feeding tube courses below the diaphragm through the stomach, though its tip is not included on the image. RIGHT-sided pigtail chest tube with a small residual basilar and medial pneumothorax. Airspace consolidation in the lower lobes, demonstrating gradual improvement since the examinations 2 days ago. Interstitial opacities with mid and lower zone predominance, unchanged. No new pulmonary parenchymal abnormalities. Extensive subcutaneous emphysema throughout both sides of the chest wall and in both  sides of the neck, worse than on yesterday's examination. IMPRESSION: 1.  Support apparatus satisfactory. 2. Residual small basilar and medial RIGHT pneumothorax with pigtail chest tube in place. 3. Improving BILATERAL lower lobe pneumonia. 4. No new new pulmonary parenchymal abnormalities. 5. Worsening extensive subcutaneous emphysema. Electronically Signed   By: Evangeline Dakin M.D.   On: 12/21/2017 11:23   Dg Chest Port 1 View  Result Date: 12/20/2017 CLINICAL DATA:  Pneumothorax. EXAM: PORTABLE CHEST 1 VIEW COMPARISON:  Chest x-ray from same day at 8:32 a.m. FINDINGS: Interval placement of a right sided chest tube. No significant residual pneumothorax. Unchanged endotracheal tube, feeding tube, and left subclavian central venous catheter. Patchy consolidations at the lung bases are similar to prior study. Unchanged subcutaneous emphysema in the bilateral chest wall and lower neck. No acute osseous abnormality. IMPRESSION: 1. Interval placement of a right sided chest tube without significant residual pneumothorax. 2. Stable bilateral airspace disease. Electronically Signed   By: Titus Dubin M.D.   On: 12/20/2017 10:59   Dg Chest Port 1 View  Result Date: 12/20/2017 CLINICAL DATA:  Sudden onset of subcutaneous air. Intubated patient. EXAM: PORTABLE CHEST 1 VIEW COMPARISON:  12/19/2017 and older exams FINDINGS: There is a small right-sided pneumothorax, new since the previous day's study. There is associated subcutaneous emphysema, most evident over the right chest and at the neck base. Patchy areas of lung consolidation are similar to the previous day's study, most apparent at the bases. Endotracheal tube and left subclavian central venous line are stable in well positioned. New enteric tube passes below the diaphragm into the stomach and below the included field of view. IMPRESSION: 1. New small right-sided pneumothorax associated with significant subcutaneous air. Critical Value/emergent results  were called by telephone at the time of interpretation on 12/20/2017 at 8:52 am to the patient's nurse, DILLON, who verbally acknowledged these results. 2. No change in the bilateral airspace lung opacities. Stable support apparatus. Electronically Signed   By: Lajean Manes M.D.   On: 12/20/2017 08:53   Dg Chest Port 1 View  Result Date: 12/19/2017 CLINICAL DATA:  Initial evaluation for intubation, endotracheal tube placement. EXAM: PORTABLE CHEST 1 VIEW COMPARISON:  Prior radiograph from earlier the same day. FINDINGS: Endotracheal tube in place with tip position 17 mm above the carina. Left subclavian approach central venous catheter with tip overlying the cavoatrial  junction. Cardiac and mediastinal silhouettes are stable, and remain within normal limits. Aortic atherosclerosis. Lungs hypoinflated. Persistent patchy bibasilar airspace opacities, left slightly greater than right, suspicious for possible infiltrates. Underlying mild diffuse pulmonary interstitial congestion, worsened from previous. Probable small bilateral pleural effusions. No pneumothorax.

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
## 2017-12-28 NOTE — Progress Notes (Signed)
Called nurse Clarene Critchley, pt report given, pt gcs calm cooperative rr unlabored, vss, skin w/d, previous right ct site pink at old entry site. Transferred pt on 4liters [place] and cardiac monitor to 4E6. Pt family at bedside

## 2017-12-28 NOTE — Progress Notes (Signed)
D/c'd Cor Track from right nare.  Patient tolerated well.  EKG completed.

## 2017-12-28 NOTE — Plan of Care (Signed)
Care plans reviewed and patient is progressing.  

## 2017-12-29 DIAGNOSIS — Z9981 Dependence on supplemental oxygen: Secondary | ICD-10-CM

## 2017-12-29 DIAGNOSIS — E8809 Other disorders of plasma-protein metabolism, not elsewhere classified: Secondary | ICD-10-CM

## 2017-12-29 DIAGNOSIS — T7029XS Other effects of high altitude, sequela: Secondary | ICD-10-CM

## 2017-12-29 DIAGNOSIS — R0682 Tachypnea, not elsewhere classified: Secondary | ICD-10-CM

## 2017-12-29 DIAGNOSIS — J9600 Acute respiratory failure, unspecified whether with hypoxia or hypercapnia: Secondary | ICD-10-CM

## 2017-12-29 DIAGNOSIS — E46 Unspecified protein-calorie malnutrition: Secondary | ICD-10-CM

## 2017-12-29 DIAGNOSIS — E8881 Metabolic syndrome: Secondary | ICD-10-CM

## 2017-12-29 DIAGNOSIS — D62 Acute posthemorrhagic anemia: Secondary | ICD-10-CM

## 2017-12-29 DIAGNOSIS — R739 Hyperglycemia, unspecified: Secondary | ICD-10-CM

## 2017-12-29 DIAGNOSIS — T380X5A Adverse effect of glucocorticoids and synthetic analogues, initial encounter: Secondary | ICD-10-CM

## 2017-12-29 LAB — COMPREHENSIVE METABOLIC PANEL
ALT: 37 U/L (ref 0–44)
AST: 24 U/L (ref 15–41)
Albumin: 2.1 g/dL — ABNORMAL LOW (ref 3.5–5.0)
Alkaline Phosphatase: 29 U/L — ABNORMAL LOW (ref 38–126)
Anion gap: 6 (ref 5–15)
BUN: 47 mg/dL — ABNORMAL HIGH (ref 8–23)
CHLORIDE: 106 mmol/L (ref 98–111)
CO2: 32 mmol/L (ref 22–32)
Calcium: 8.4 mg/dL — ABNORMAL LOW (ref 8.9–10.3)
Creatinine, Ser: 0.88 mg/dL (ref 0.44–1.00)
Glucose, Bld: 106 mg/dL — ABNORMAL HIGH (ref 70–99)
POTASSIUM: 3.8 mmol/L (ref 3.5–5.1)
Sodium: 144 mmol/L (ref 135–145)
Total Bilirubin: 0.7 mg/dL (ref 0.3–1.2)
Total Protein: 4.2 g/dL — ABNORMAL LOW (ref 6.5–8.1)

## 2017-12-29 LAB — GLUCOSE, CAPILLARY
GLUCOSE-CAPILLARY: 86 mg/dL (ref 70–99)
Glucose-Capillary: 160 mg/dL — ABNORMAL HIGH (ref 70–99)
Glucose-Capillary: 252 mg/dL — ABNORMAL HIGH (ref 70–99)

## 2017-12-29 LAB — CBC
HCT: 30.9 % — ABNORMAL LOW (ref 36.0–46.0)
Hemoglobin: 9.6 g/dL — ABNORMAL LOW (ref 12.0–15.0)
MCH: 27.7 pg (ref 26.0–34.0)
MCHC: 31.1 g/dL (ref 30.0–36.0)
MCV: 89.3 fL (ref 78.0–100.0)
PLATELETS: 137 10*3/uL — AB (ref 150–400)
RBC: 3.46 MIL/uL — ABNORMAL LOW (ref 3.87–5.11)
RDW: 15.2 % (ref 11.5–15.5)
WBC: 9.3 10*3/uL (ref 4.0–10.5)

## 2017-12-29 LAB — MAGNESIUM: MAGNESIUM: 2.1 mg/dL (ref 1.7–2.4)

## 2017-12-29 MED ORDER — VITAMIN B-1 100 MG PO TABS
100.0000 mg | ORAL_TABLET | Freq: Every day | ORAL | Status: DC
  Administered 2017-12-30: 100 mg via ORAL
  Filled 2017-12-29: qty 1

## 2017-12-29 NOTE — Progress Notes (Signed)
Nutrition Follow-up  DOCUMENTATION CODES:   Not applicable  INTERVENTION:    Magic cup TID with meals, each supplement provides 290 kcal and 9 grams of protein  Prostat liquid protein po 30 ml TID with meals, each supplement provides 100 kcal, 15 grams protein  NEW NUTRITION DIAGNOSIS:   Increased nutrient needs related to acute illness as evidenced by estimated needs, ongoing  GOAL:   Patient will meet greater than or equal to 90% of their needs, progressing  MONITOR:   PO intake, Supplement acceptance, Labs, Weight trends, Skin, I & O's  ASSESSMENT:    67 yo female admitted with acute severe persistent respiratory failure, failed BiPap requiring intubation, 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 PMH of HTN, HLD, GERD   9/5 extubated 9/7 s/p MBSS 9/8 Cortrak & TF discontinued  RD spoke with pt and pt's daughter-in-law at bedside. Pt currently on a Dys 3-nectar thick liquid diet. Daughter-in-law states pt eating well.   Her PO intake 50% per flowsheet records. Prostat liquid protein ordered per MD 9/8. Labs & medications reviewed. CBG's [id number].  Diet Order:   Diet Order            DIET DYS 3 Room service appropriate? Yes; Fluid consistency: Nectar Thick  Diet effective now             EDUCATION NEEDS:   No education needs have been identified at this time  Skin:  Skin Assessment: Reviewed RN Assessment  Last BM:  9/8  Height:   Ht Readings from Last 1 Encounters:  12/27/17 5\' 1"  (1.549 m)   Weight:   Wt Readings from Last 1 Encounters:  12/29/17 70.7 kg   BMI:  Body mass index is 29.45 kg/m.  Estimated Nutritional Needs:   Kcal:  1700-1900  Protein:  90-105 gm  Fluid:  1.7-1.9 L  Arthur Holms, RD, LDN Pager #: [phone] After-Hours Pager #: [phone]

## 2017-12-29 NOTE — Consult Note (Signed)
Physical Medicine and Rehabilitation Consult   Reason for Consult: Debility.  Referring Physician: Dr. Thereasa Solo.    HPI: Tasha [name] is a 67 y.o. non-english speaking female with history of HTN, metabolic syndrome who was admitted from OSH with SOB after possible accidental antifreeze inhalation mid July. History taken from chart review. Work up indicated BOOP/COP and high dose steroids initiated. She developed increase WOB requiring intubation 8/30 and right chest tube due to PTX likely due to barotrauma and poor lung compliance.  Hospital course significant for hypotensive shock, A fib with RVR, fluid overload, electrolyte abnormality and SOB with hypoxia.  Therapy evaluations done revealing deficits in mobility and self care tasks. CIR recommended due to debility.   Review of Systems  Unable to perform ROS: Language     Past Medical History:  Diagnosis Date  . Acute respiratory failure ([place])   . CAP (community acquired pneumonia)   . Hyperlipidemia   . Hypertension   . Metabolic syndrome     Past Surgical History:  Procedure Laterality Date  . CESAREAN SECTION      Family History  Problem Relation Age of Onset  . Cancer Father   . Healthy Brother   . Healthy Daughter   . Healthy Brother     Social History:  Married. 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 She reports that she has never smoked. She has never used smokeless tobacco. She reports that she does not drink alcohol or use drugs.    Allergies  Allergen Reactions  . Homatropine     Pt doesn't remember  . Hydromet [Hydrocodone-Homatropine]     Pt doesn't remember  . Amoxicillin Rash    Medications Prior to Admission  Medication Sig Dispense Refill  . albuterol (PROVENTIL HFA;VENTOLIN HFA) 108 (90 Base) MCG/ACT inhaler Inhale 2 puffs into the lungs every 6 (six) hours as needed for wheezing or shortness of breath. 1 Inhaler 2  . atorvastatin  (LIPITOR) 40 MG tablet TAKE (1) TABLET BY MOUTH ONCE DAILY. (Patient taking differently: Take 40 mg by mouth daily. ) 30 tablet 2  . benzonatate (TESSALON) 200 MG capsule Take 1 capsule (200 mg total) by mouth 2 (two) times daily as needed for cough. 20 capsule 0  . cetirizine (ZYRTEC) 10 MG tablet Take 10 mg by mouth daily.    Marland Kitchen escitalopram (LEXAPRO) 10 MG tablet TAKE 1 TABLET DAILY. 30 tablet 2  . guaiFENesin (MUCINEX) 600 MG 12 hr tablet Take 600 mg by mouth 2 (two) times daily.    Marland Kitchen lisinopril-hydrochlorothiazide (PRINZIDE,ZESTORETIC) 20-25 MG tablet TAKE (1) TABLET BY MOUTH ONCE DAILY. (Patient taking differently: Take 1 tablet by mouth daily. ) 30 tablet 0  . pantoprazole (PROTONIX) 20 MG tablet TAKE (1) TABLET BY MOUTH ONCE DAILY. (Patient taking differently: Take 20 mg by mouth daily. ) 30 tablet 2  . fluconazole (DIFLUCAN) 100 MG tablet Take 100 mg by mouth daily. x7 days      Home: Home Living Family/patient expects to be discharged to:: Private residence Living Arrangements: Spouse/significant other Available Help at Discharge: Family, Available 24 hours/day Type of Home: House Home Access: Stairs to enter [employer] of Steps: 3 Entrance Stairs-Rails: Right Home Layout: One level Bathroom Shower/Tub: Chiropodist: Standard Home Equipment: None Additional Comments: Lives with husband available for 24/7 support. Son works as Diplomatic Services operational officer for [employer] History: Prior Function Level of Independence: Independent Comments: Retired from work.  Indep with all mobility, stays active inside the house Functional Status:  Mobility: Bed Mobility Overal bed mobility: Needs Assistance Bed Mobility: Supine to Sit Supine to sit: Min assist, HOB elevated General bed mobility comments: MinA  to scoot hips to EOB Transfers Overall transfer level: Needs assistance Equipment used: 1 person hand held assist Transfers: Sit to/from Stand, Stand Pivot  Transfers Sit to Stand: Min assist, +2 safety/equipment Stand pivot transfers: Min assist, +2 safety/equipment General transfer comment: min assist for safety and balance Ambulation/Gait Ambulation/Gait assistance: Min assist, +2 safety/equipment Gait Distance (Feet): 15 Feet Assistive device: Rolling walker (2 wheeled) Gait Pattern/deviations: Step-to pattern General Gait Details: Very slowed amb with RW and minA+2. Frequent standing rest breaks with SpO2 down to 81% on 3LO2 [place], up to 88% on 5LO2 [place]. RN with chair follow as pt quickly fatigued. C/o dizziness, BP stable Gait velocity: Decreased Gait velocity interpretation: <1.31 ft/sec, indicative of household ambulator    ADL: ADL Overall ADL's : Needs assistance/impaired Grooming: Set up, Sitting Upper Body Bathing: Sitting, Minimal assistance Lower Body Bathing: Minimal assistance, Sit to/from stand Upper Body Dressing : Minimal assistance, Sitting Lower Body Dressing: Minimal assistance, Sit to/from stand Lower Body Dressing Details (indicate cue type and reason): able to adjust socks, but would need assist to thread; min assist in standing  Toilet Transfer: Minimal assistance, Stand-pivot, +2 for safety/equipment(simulated to recliner ) Toileting- Clothing Manipulation and Hygiene: Moderate assistance, Sit to/from stand Functional mobility during ADLs: Minimal assistance  Cognition: Cognition Overall Cognitive Status: Difficult to assess Orientation Level: Oriented X4 Cognition Arousal/Alertness: Awake/alert Behavior During Therapy: Flat affect Overall Cognitive Status: Difficult to assess General Comments: follow commands, functionally appropriate during session Difficult to assess due to: Non-English speaking   Blood pressure (!) 100/56, pulse 65, temperature 98 F (36.7 C), temperature source Oral, resp. rate 19, height 5\' 1"  (1.549 m), weight 70.7 kg, SpO2 99 %. Physical Exam  Nursing note and vitals  reviewed. Constitutional: She appears well-developed and well-nourished. Nasal cannula in place.  HENT:  Head: Normocephalic and atraumatic.  Eyes: EOM are normal. Right eye exhibits no discharge. Left eye exhibits no discharge.  Neck: Normal range of motion. Neck supple.  Cardiovascular: Normal rate and regular rhythm.  Respiratory:  Pursed lip breathing noted.  + [place].  GI: Soft. Bowel sounds are normal.  Musculoskeletal:  No edema or tenderness in extremities  Neurological: She is alert.  Motor: Limited by language, >/ 3/5 throughout dysphonia  Skin: Skin is warm and dry.  Psychiatric:  Unable to assess due to language    Results for orders placed or performed during the hospital encounter of 12/16/17 (from the past 24 hour(s))  TSH     Status: Abnormal   Collection Time: 12/28/17 11:48 AM  Result Value Ref Range   TSH 12.078 (H) 0.350 - 4.500 uIU/mL  Glucose, capillary     Status: Abnormal   Collection Time: 12/28/17 12:21 PM  Result Value Ref Range   Glucose-Capillary 126 (H) 70 - 99 mg/dL  Glucose, capillary     Status: Abnormal   Collection Time: 12/28/17  4:44 PM  Result Value Ref Range   Glucose-Capillary 139 (H) 70 - 99 mg/dL  Glucose, capillary     Status: Abnormal   Collection Time: 12/28/17  8:11 PM  Result Value Ref Range   Glucose-Capillary 174 (H) 70 - 99 mg/dL  Glucose, capillary     Status: None   Collection Time: 12/28/17 10:37 PM  Result Value Ref  Range   Glucose-Capillary 76 70 - 99 mg/dL  CBC     Status: Abnormal   Collection Time: 12/29/17  4:10 AM  Result Value Ref Range   WBC 9.3 4.0 - 10.5 K/uL   RBC 3.46 (L) 3.87 - 5.11 MIL/uL   Hemoglobin 9.6 (L) 12.0 - 15.0 g/dL   HCT 30.9 (L) 36.0 - 46.0 %   MCV 89.3 78.0 - 100.0 fL   MCH 27.7 26.0 - 34.0 pg   MCHC 31.1 30.0 - 36.0 g/dL   RDW 15.2 11.5 - 15.5 %   Platelets 137 (L) 150 - 400 K/uL  Comprehensive metabolic panel     Status: Abnormal   Collection Time: 12/29/17  4:10 AM  Result Value Ref  Range   Sodium 144 135 - 145 mmol/L   Potassium 3.8 3.5 - 5.1 mmol/L   Chloride 106 98 - 111 mmol/L   CO2 32 22 - 32 mmol/L   Glucose, Bld 106 (H) 70 - 99 mg/dL   BUN 47 (H) 8 - 23 mg/dL   Creatinine, Ser 0.88 0.44 - 1.00 mg/dL   Calcium 8.4 (L) 8.9 - 10.3 mg/dL   Total Protein 4.2 (L) 6.5 - 8.1 g/dL   Albumin 2.1 (L) 3.5 - 5.0 g/dL   AST 24 15 - 41 U/L   ALT 37 0 - 44 U/L   Alkaline Phosphatase 29 (L) 38 - 126 U/L   Total Bilirubin 0.7 0.3 - 1.2 mg/dL   GFR calc non Af Amer >60 >60 mL/min   GFR calc Af Amer >60 >60 mL/min   Anion gap 6 5 - 15  Magnesium     Status: None   Collection Time: 12/29/17  4:10 AM  Result Value Ref Range   Magnesium 2.1 1.7 - 2.4 mg/dL  Glucose, capillary     Status: None   Collection Time: 12/29/17  5:58 AM  Result Value Ref Range   Glucose-Capillary 86 70 - 99 mg/dL   No results found.  Assessment/Plan: Diagnosis: debility Labs independently reviewed.  Records reviewed and summated above.  1. Does the need for close, 24 hr/day medical supervision in concert with the patient's rehab needs make it unreasonable for this patient to be served in a less intensive setting? Yes  2. Co-Morbidities requiring supervision/potential complications: hypotensive shock, A fib (continue meds, monitor heart rate with increased physical activity), fluid overload, HTN (monitor and provide prns in accordance with increased physical exertion and pain), metabolic syndrome, tachypnea (monitor RR and O2 Sats with increased physical exertion), steroid-induced hyperglycemia (Monitor in accordance with exercise and adjust meds as necessary), hypoalbuminemia (maximize nutrition for overall health and wound healing), ABLA (transfuse if necessary to ensure appropriate perfusion for increased activity tolerance), with supplemental oxygen as tolerated 3. Due to bladder management, safety, disease management and patient education, does the patient require 24 hr/day rehab nursing?  Yes 4. Does the patient require coordinated care of a physician, rehab nurse, PT (1-2 hrs/day, 5 days/week) and OT (1-2 hrs/day, 5 days/week) to address physical and functional deficits in the context of the above medical diagnosis(es)? Yes Addressing deficits in the following areas: balance, endurance, locomotion, strength, transferring, bathing, dressing, toileting and psychosocial support 5. Can the patient actively participate in an intensive therapy program of at least 3 hrs of therapy per day at least 5 days per week? Potentially 6. The potential for patient to make measurable gains while on inpatient rehab is excellent 7. Anticipated functional outcomes upon discharge from inpatient  rehab are supervision  with PT, supervision with OT, n/a with SLP. 8. Estimated rehab length of stay to reach the above functional goals is: 13-16 days. 9. Anticipated D/C setting: Home 10. Anticipated post D/C treatments: HH therapy and Home excercise program 11. Overall Rehab/Functional Prognosis: excellent  RECOMMENDATIONS: This patient's condition is appropriate for continued rehabilitative care in the following setting: CIR when able to tolerate 3 hours of therapy a day. Patient has agreed to participate in recommended program. Potentially Note that insurance prior authorization may be required for reimbursement for recommended care.  Comment: Rehab Admissions Coordinator to follow up.   I have personally performed a face to face diagnostic evaluation, including, but not limited to relevant history and physical exam findings, of this patient and developed relevant assessment and plan.  Additionally, I have reviewed and concur with the physician assistant's documentation above.   Delice Lesch, MD, ABPMR Bary Leriche, PA-C 12/29/2017

## 2017-12-29 NOTE — Progress Notes (Signed)
[place] TEAM 1 - Stepdown/ICU TEAM  Tasha [name]  DGU:[id number] DOB: Mar 12, 1951 DOA: 12/16/2017 PCP: Dettinger, Fransisca Kaufmann, MD    Brief Narrative:  67 yo F w/ a hx of HTN, GERD, and HLD who was admitted to [facility] for acute respiratory failure thought to be due to CAP v/s possible accidental antifreeze inhalation. She did not improve w/ tx for same. Due to her slow improvement w/ prolonged requirement for high O2 support, transfer was sought for Pulmonary consultation.  Significant Events: 8/27 admit on transfer from [facility] 8/28 High-resolution CT chest > bilateral lower lobe consolidation with groundglass attenuation 8/29 TTE - EF 65-70 % with no WMA and grade 1 diastolic dysfunction 9/5 - extubated  Subjective: Resting comfortably in bed.  Denies cp, n/v, or abdom pain.  Agrees to CIR if accepted.    Assessment & Plan:  Acute severe persistent hypoxic respiratory failure - ARDS/BOOP possibly caused by antifreeze inhalation  was intubated for a week and extubated on 12/25/2017 - much improved at this time - slow weaning of steroids ongoing   Subacute ILD - Bilateral Consolidation High ESR - ANA + in speckled pattern - RVP negative / negative infection workup - ?BOOP/COP - PCCM has started empiric tx for BOOP as noted above, w/ planned very slow steroid taper   Brief episode of A. fib with RVR while she was intubated in the ICU - resolved after 48 hours of amiodarone, TTE noted a preserved EF of 60% with no wall motion abnormality, pulmonary artery pressures were slightly elevated at 54 but right atrium is not dilated - Dr. Oval Linsey w/ Cards suggested low-dose beta-blocker and aspirin, and a 30-day Holter monitor at time of d/c to look for paroxysmal A. Fib - likely this was brief atrial fibrillation due to acute pulmonary stress - TSH is now low   Barotrauma induced right-sided pneumothorax Required chest tube - no recurrence - chest tube has been out   Hypertension  BP controlled now  - avoid hydralazine as pt had poor reaction  Hyperlipidemia Holding lipitor for now w/ very limited oral intake   Elevated TSH Recent exposure to amiodarone, prudent to repeat TSH in 3 to 4 weeks by PCP along with free T4 and T3 - this could be sick euthyroid syndrome  GERD  DVT prophylaxis: SCDs Code Status: FULL CODE Family Communication: spoke with family at bedside Disposition Plan: ready for transfer to CIR when bed available   Consultants:  PCCM  Antimicrobials:  Cefepime 8/27 > 8/28 Vancomycin 8/27 > 8/28  Objective: Blood pressure (!) 100/56, pulse 65, temperature 98 F (36.7 C), temperature source Oral, resp. rate 19, height _0  (1.549 m), weight 70.7 kg, SpO2 99 %.  Intake/Output Summary (Last 24 hours) at 12/29/2017 1153 Last data filed at 12/29/2017 1138 Gross per 24 hour  Intake 360 ml  Output 1400 ml  Net -1040 ml   Filed Weights   12/27/17 2149 12/28/17 0300 12/29/17 0445  Weight: 65.1 kg 65.1 kg 70.7 kg    Examination: General: no acute resp distress  Lungs: CTA B - no wheezing  Cardiovascular: Regular rate and rhythm - no M or rub  Abdomen: Nontender, nondistended, soft, bowel sounds positive, no rebound Extremities: No signif edema bilateral lower extremities  CBC: Recent Labs  Lab 12/23/17 0417  12/24/17 0455  12/27/17 0558 12/28/17 0441 12/29/17 0410  WBC 9.9   < > 7.9   < > 12.5* 10.9* 9.3  NEUTROABS 8.4*  --  6.7  --   --   --   --  HGB 10.1*   < > 10.1*   < > 11.5* 10.9* 9.6*  HCT 32.6*   < > 33.6*   < > 38.0 35.6* 30.9*  MCV 89.3   < > 91.3   < > 92.9 91.3 89.3  PLT 144*   < > 157   < > 214 169 137*   < > = values in this interval not displayed.   Basic Metabolic Panel: Recent Labs  Lab 12/26/17 0407 12/27/17 0558 12/28/17 0441 12/29/17 0410  NA 156* 153* 143 144  K 4.4 4.5 4.3 3.8  CL 112* 110 105 106  CO2 34* 34* 31 32  GLUCOSE 196* 175* 212* 106*  BUN 72* 61* 59* 47*  CREATININE 1.09* 0.96 0.86 0.88  CALCIUM 9.2 9.0  8.4* 8.4*  MG 2.4 2.3 2.1 2.1  PHOS 4.6 4.6 3.7  --    GFR: Estimated Creatinine Clearance: 55.8 mL/min (by C-G formula based on SCr of 0.88 mg/dL).  Liver Function Tests: Recent Labs  Lab 12/29/17 0410  AST 24  ALT 37  ALKPHOS 29*  BILITOT 0.7  PROT 4.2*  ALBUMIN 2.1*     Recent Results (from the past 240 hour(s))  Culture, bal-quantitative     Status: None   Collection Time: 12/19/17  2:15 PM  Result Value Ref Range Status   Specimen Description BRONCHIAL ALVEOLAR LAVAGE  Final   Special Requests NONE  Final   Gram Stain NO WBC SEEN NO ORGANISMS SEEN   Final   Culture   Final    NO GROWTH 2 DAYS Performed at [facility], [address], [place], [place] [address]    Report Status 12/22/2017 FINAL  Final  Pneumocystis smear by DFA     Status: None   Collection Time: 12/19/17  2:15 PM  Result Value Ref Range Status   Specimen Source-PJSRC BRONCHIAL ALVEOLAR LAVAGE  Final   Pneumocystis jiroveci Ag NEGATIVE  Final    Comment: Performed at [facility] Performed at [facility], [address], [place], [place] [address]   Acid Fast Smear (AFB)     Status: None   Collection Time: 12/19/17  2:15 PM  Result Value Ref Range Status   AFB Specimen Processing MLEAK  Final    Comment: (NOTE) Test Not Performed. Specimen leaked in transit and is no longer suitable for testing.     Waldon Reining notified 12/23/2017    Acid Fast Smear NOT PERFORMED  Final    Comment: (NOTE) Test not performed Performed At: [facility] [place], [place] [id number] Rush Farmer MD ZP:[id number]    Source (AFB) BRONCHIAL ALVEOLAR LAVAGE  Final  Acid Fast Culture with reflexed sensitivities     Status: None   Collection Time: 12/19/17  2:15 PM  Result Value Ref Range Status   Acid Fast Culture MLEAK  Final    Comment: (NOTE) Test Not Performed. Specimen leaked in transit and is no longer suitable for testing.     Waldon Reining notified  12/23/2017 Performed At: [facility] [place], [place] [id number] Rush Farmer MD [id number]    Source of Sample BRONCHIAL ALVEOLAR LAVAGE  Final  Culture, fungus without smear     Status: None (Preliminary result)   Collection Time: 12/19/17  2:15 PM  Result Value Ref Range Status   Specimen Description BRONCHIAL ALVEOLAR LAVAGE  Final   Special Requests NONE  Final   Culture   Final  NO FUNGUS ISOLATED AFTER 7 DAYS Performed at [facility], [place] [address], [place], [place] [address]    Report Status PENDING  Incomplete  Acid Fast Smear (AFB)     Status: None (Preliminary result)   Collection Time: 12/19/17  7:00 PM  Result Value Ref Range Status   AFB Specimen Processing Not Indicated  Final   Acid Fast Smear Negative  Final    Comment: (NOTE) Performed At: [facility] [place], [place] [id number] Rush Farmer MD SU:[id number]    Source (AFB) PENDING  Incomplete     Scheduled Meds: . aspirin  81 mg Oral Daily  . carvedilol  3.125 mg Oral BID WC  . chlorhexidine  15 mL Mouth Rinse BID  . Chlorhexidine Gluconate Cloth  6 each Topical Daily  . docusate sodium  200 mg Oral BID  . enoxaparin (LOVENOX) injection  40 mg Subcutaneous Q24H  . feeding supplement (PRO-STAT SUGAR FREE 64)  30 mL Oral TID WC  . mouth rinse  15 mL Mouth Rinse q12n4p  . multivitamin  15 mL Per Tube Daily  . pantoprazole  40 mg Oral Daily  . predniSONE  50 mg Oral Q breakfast  . sodium chloride flush  10-40 mL Intracatheter Q12H  . thiamine  100 mg Per Tube Daily     LOS: 13 days   Cherene Altes, MD [facility] Office  [phone] Pager - Text Page per Shea Evans  If 7PM-7AM, please contact night-coverage per Amion 12/29/2017, 11:53 AM
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
## 2017-12-29 NOTE — Progress Notes (Signed)
Inpatient Rehabilitation-Admissions Coordinator   Followed up with pt and her daughter Verdis Frederickson regarding CIR. Per pt request, AC spoke with Verdis Frederickson (over the phone) who is in agreement for CIR at this time. [facility] used [employer] interpreter Beverlee Nims # [phone] to introduce self to pt and review information presented. Pt and family in agreement for pursuing CIR tomorrow, pending medical clearance. Will follow up tomorrow.   Please call if questions.   Jhonnie Garner, OTR/L  Rehab Admissions Coordinator  [phone] 12/29/2017 5:17 PM

## 2017-12-29 NOTE — Progress Notes (Signed)
   12/28/17 2118  What Happened  Was fall witnessed? Yes  Who witnessed fall? Marland Kitchen B-Staples RN  Patients activity before fall other (comment) (BSC assist)  Point of contact buttocks  Was patient injured? No  Follow Up  MD notified [place]  Time MD notified 2125  Family notified Yes-comment  Time family notified 2118  Additional tests Yes-comment  Simple treatment Other (comment) (Fan, cold washcloth, reduce room temp)  Adult Fall Risk Assessment  Risk Factor Category (scoring not indicated) High fall risk per protocol (document High fall risk);Fall has occurred during this admission (document High fall risk)  Patient's Fall Risk High Fall Risk (>13 points)  Adult Fall Risk Interventions  Required Bundle Interventions *See Row Information* High fall risk - low, moderate, and high requirements implemented  Additional Interventions Use of appropriate toileting equipment (bedpan, BSC, etc.)  Screening for Fall Injury Risk (To be completed on HIGH fall risk patients) - Assessing Need for Low Bed  Risk For Fall Injury- Low Bed Criteria None identified - Continue screening  Screening for Fall Injury Risk (To be completed on HIGH fall risk patients who do not meet crieteria for Low Bed) - Assessing Need for Floor Mats Only  Risk For Fall Injury- Criteria for Floor Mats None identified - No additional interventions needed  Vitals  Temp 97.9 F (36.6 C)  Temp Source Oral  BP 139/82  BP Location Right Arm  BP Method Automatic  Patient Position (if appropriate) Sitting  Pulse Rate 97  Pulse Rate Source Monitor  ECG Heart Rate (!) 102  Resp (!) 35  Oxygen Therapy  SpO2 92 %  O2 Device Nasal Cannula  O2 Flow Rate (L/min) 2 L/min  Pain Assessment  Pain Scale 0-10  Pain Score 0  Neurological  Neuro (WDL) WDL  Level of Consciousness Alert  Orientation Level Oriented X4  Cognition Appropriate at baseline  Speech Clear  Pupil Assessment  No

## 2017-12-29 NOTE — Progress Notes (Signed)
Inpatient Rehabilitation  Met with patient and daughter-in-law at bedside to discuss team's recommendation for IP Rehab.  Shared booklets, insurance, and answered initial questions with [employer] interpreter Lizette [phone].  They requested that I follow up with patient's daughter Verdis Frederickson at [phone]; however, unable to leave a message.  They report she will be here later and my co-worker will attempt to meet up with her.  Discussed with case manager that 9/10 is hopeful target for IP Rehab pending patient and family agreement.  Call if questions.    Carmelia Roller., CCC/SLP Admission Coordinator  [place]  Cell [phone]

## 2017-12-30 ENCOUNTER — Inpatient Hospital Stay (HOSPITAL_COMMUNITY)
Admission: RE | Admit: 2017-12-30 | Discharge: 2018-01-09 | DRG: 945 | Disposition: A | Discharge status: Home Health | Payer: Medicare Other | Source: Intra-hospital | Attending: Physical Medicine & Rehabilitation | Admitting: Physical Medicine & Rehabilitation

## 2017-12-30 ENCOUNTER — Encounter (HOSPITAL_COMMUNITY): Payer: Self-pay | Admitting: *Deleted

## 2017-12-30 DIAGNOSIS — R490 Dysphonia: Secondary | ICD-10-CM | POA: Diagnosis present

## 2017-12-30 DIAGNOSIS — R7303 Prediabetes: Secondary | ICD-10-CM | POA: Diagnosis present

## 2017-12-30 DIAGNOSIS — E785 Hyperlipidemia, unspecified: Secondary | ICD-10-CM | POA: Diagnosis present

## 2017-12-30 DIAGNOSIS — E46 Unspecified protein-calorie malnutrition: Secondary | ICD-10-CM | POA: Diagnosis not present

## 2017-12-30 DIAGNOSIS — R1310 Dysphagia, unspecified: Secondary | ICD-10-CM

## 2017-12-30 DIAGNOSIS — R339 Retention of urine, unspecified: Secondary | ICD-10-CM

## 2017-12-30 DIAGNOSIS — E8881 Metabolic syndrome: Secondary | ICD-10-CM | POA: Diagnosis present

## 2017-12-30 DIAGNOSIS — D62 Acute posthemorrhagic anemia: Secondary | ICD-10-CM | POA: Diagnosis present

## 2017-12-30 DIAGNOSIS — R740 Nonspecific elevation of levels of transaminase and lactic acid dehydrogenase [LDH]: Secondary | ICD-10-CM | POA: Diagnosis present

## 2017-12-30 DIAGNOSIS — E8809 Other disorders of plasma-protein metabolism, not elsewhere classified: Secondary | ICD-10-CM | POA: Diagnosis present

## 2017-12-30 DIAGNOSIS — R7989 Other specified abnormal findings of blood chemistry: Secondary | ICD-10-CM | POA: Diagnosis not present

## 2017-12-30 DIAGNOSIS — I959 Hypotension, unspecified: Secondary | ICD-10-CM | POA: Diagnosis not present

## 2017-12-30 DIAGNOSIS — I10 Essential (primary) hypertension: Secondary | ICD-10-CM | POA: Diagnosis not present

## 2017-12-30 DIAGNOSIS — K219 Gastro-esophageal reflux disease without esophagitis: Secondary | ICD-10-CM | POA: Diagnosis present

## 2017-12-30 DIAGNOSIS — T380X5A Adverse effect of glucocorticoids and synthetic analogues, initial encounter: Secondary | ICD-10-CM | POA: Diagnosis not present

## 2017-12-30 DIAGNOSIS — M170 Bilateral primary osteoarthritis of knee: Secondary | ICD-10-CM | POA: Diagnosis present

## 2017-12-30 DIAGNOSIS — E0781 Sick-euthyroid syndrome: Secondary | ICD-10-CM | POA: Diagnosis present

## 2017-12-30 DIAGNOSIS — R739 Hyperglycemia, unspecified: Secondary | ICD-10-CM | POA: Diagnosis present

## 2017-12-30 DIAGNOSIS — Z9981 Dependence on supplemental oxygen: Secondary | ICD-10-CM

## 2017-12-30 DIAGNOSIS — Z881 Allergy status to other antibiotic agents status: Secondary | ICD-10-CM

## 2017-12-30 DIAGNOSIS — R059 Cough, unspecified: Secondary | ICD-10-CM

## 2017-12-30 DIAGNOSIS — I480 Paroxysmal atrial fibrillation: Secondary | ICD-10-CM

## 2017-12-30 DIAGNOSIS — Z885 Allergy status to narcotic agent status: Secondary | ICD-10-CM | POA: Diagnosis not present

## 2017-12-30 DIAGNOSIS — I4819 Other persistent atrial fibrillation: Secondary | ICD-10-CM | POA: Insufficient documentation

## 2017-12-30 DIAGNOSIS — D696 Thrombocytopenia, unspecified: Secondary | ICD-10-CM

## 2017-12-30 DIAGNOSIS — R7401 Elevation of levels of liver transaminase levels: Secondary | ICD-10-CM

## 2017-12-30 DIAGNOSIS — J849 Interstitial pulmonary disease, unspecified: Secondary | ICD-10-CM

## 2017-12-30 DIAGNOSIS — E876 Hypokalemia: Secondary | ICD-10-CM | POA: Diagnosis present

## 2017-12-30 DIAGNOSIS — I481 Persistent atrial fibrillation: Secondary | ICD-10-CM

## 2017-12-30 DIAGNOSIS — Z888 Allergy status to other drugs, medicaments and biological substances status: Secondary | ICD-10-CM | POA: Diagnosis not present

## 2017-12-30 DIAGNOSIS — R5381 Other malaise: Principal | ICD-10-CM | POA: Diagnosis present

## 2017-12-30 DIAGNOSIS — Z8679 Personal history of other diseases of the circulatory system: Secondary | ICD-10-CM | POA: Diagnosis not present

## 2017-12-30 DIAGNOSIS — Z79899 Other long term (current) drug therapy: Secondary | ICD-10-CM

## 2017-12-30 DIAGNOSIS — R040 Epistaxis: Secondary | ICD-10-CM | POA: Diagnosis not present

## 2017-12-30 DIAGNOSIS — R05 Cough: Secondary | ICD-10-CM

## 2017-12-30 LAB — COMPREHENSIVE METABOLIC PANEL
ALT: 57 U/L — ABNORMAL HIGH (ref 0–44)
ANION GAP: 5 (ref 5–15)
AST: 31 U/L (ref 15–41)
Albumin: 2.2 g/dL — ABNORMAL LOW (ref 3.5–5.0)
Alkaline Phosphatase: 34 U/L — ABNORMAL LOW (ref 38–126)
BUN: 33 mg/dL — ABNORMAL HIGH (ref 8–23)
CALCIUM: 8.1 mg/dL — AB (ref 8.9–10.3)
CHLORIDE: 106 mmol/L (ref 98–111)
CO2: 31 mmol/L (ref 22–32)
Creatinine, Ser: 0.91 mg/dL (ref 0.44–1.00)
GFR calc non Af Amer: >60 mL/min (ref >60)
Glucose, Bld: 108 mg/dL — ABNORMAL HIGH (ref 70–99)
Potassium: 3.6 mmol/L (ref 3.5–5.1)
Sodium: 142 mmol/L (ref 135–145)
Total Bilirubin: 0.6 mg/dL (ref 0.3–1.2)
Total Protein: 4.2 g/dL — ABNORMAL LOW (ref 6.5–8.1)

## 2017-12-30 LAB — CBC
HCT: 29.5 % — ABNORMAL LOW (ref 36.0–46.0)
HEMOGLOBIN: 9.2 g/dL — AB (ref 12.0–15.0)
MCH: 28 pg (ref 26.0–34.0)
MCHC: 31.2 g/dL (ref 30.0–36.0)
MCV: 89.9 fL (ref 78.0–100.0)
PLATELETS: 102 10*3/uL — AB (ref 150–400)
RBC: 3.28 MIL/uL — AB (ref 3.87–5.11)
RDW: 14.6 % (ref 11.5–15.5)
WBC: 8.5 10*3/uL (ref 4.0–10.5)

## 2017-12-30 LAB — GLUCOSE, CAPILLARY
GLUCOSE-CAPILLARY: 219 mg/dL — AB (ref 70–99)
Glucose-Capillary: 158 mg/dL — ABNORMAL HIGH (ref 70–99)

## 2017-12-30 MED ORDER — ASPIRIN 81 MG PO CHEW
81.0000 mg | CHEWABLE_TABLET | Freq: Every day | ORAL | Status: DC
  Administered 2017-12-31 – 2018-01-09 (×10): 81 mg via ORAL
  Filled 2017-12-30 (×10): qty 1

## 2017-12-30 MED ORDER — RESOURCE THICKENUP CLEAR PO POWD
ORAL | Status: DC | PRN
  Filled 2017-12-30: qty 125

## 2017-12-30 MED ORDER — LIDOCAINE HCL URETHRAL/MUCOSAL 2 % EX GEL
CUTANEOUS | Status: DC | PRN
  Filled 2017-12-30: qty 5

## 2017-12-30 MED ORDER — POLYETHYLENE GLYCOL 3350 17 G PO PACK: 17.0000 g | PACK | Freq: Every day | ORAL | Status: DC | PRN

## 2017-12-30 MED ORDER — GUAIFENESIN-DM 100-10 MG/5ML PO SYRP
5.0000 mL | ORAL_SOLUTION | Freq: Four times a day (QID) | ORAL | Status: DC | PRN
  Administered 2018-01-04: 5 mL via ORAL
  Filled 2017-12-30: qty 5

## 2017-12-30 MED ORDER — INSULIN ASPART 100 UNIT/ML ~~LOC~~ SOLN
0.0000 [IU] | Freq: Three times a day (TID) | SUBCUTANEOUS | Status: DC
  Administered 2017-12-30: 2 [IU] via SUBCUTANEOUS
  Administered 2017-12-31: 3 [IU] via SUBCUTANEOUS
  Administered 2018-01-01 – 2018-01-02 (×2): 2 [IU] via SUBCUTANEOUS
  Administered 2018-01-03: 1 [IU] via SUBCUTANEOUS
  Administered 2018-01-04: 3 [IU] via SUBCUTANEOUS
  Administered 2018-01-05: 2 [IU] via SUBCUTANEOUS
  Administered 2018-01-06: 1 [IU] via SUBCUTANEOUS
  Administered 2018-01-07: 2 [IU] via SUBCUTANEOUS
  Administered 2018-01-08: 1 [IU] via SUBCUTANEOUS
  Administered 2018-01-09: 3 [IU] via SUBCUTANEOUS

## 2017-12-30 MED ORDER — OXYCODONE HCL 5 MG PO TABS: 5.0000 mg | ORAL_TABLET | Freq: Four times a day (QID) | ORAL | Status: DC | PRN

## 2017-12-30 MED ORDER — ALUM & MAG HYDROXIDE-SIMETH 200-200-20 MG/5ML PO SUSP: 30.0000 mL | ORAL | Status: DC | PRN

## 2017-12-30 MED ORDER — PANTOPRAZOLE SODIUM 40 MG PO TBEC
40.0000 mg | DELAYED_RELEASE_TABLET | Freq: Every day | ORAL | Status: DC
  Administered 2017-12-31 – 2018-01-09 (×10): 40 mg via ORAL
  Filled 2017-12-30 (×10): qty 1

## 2017-12-30 MED ORDER — TRAZODONE HCL 50 MG PO TABS: 25.0000 mg | ORAL_TABLET | Freq: Every evening | ORAL | Status: DC | PRN

## 2017-12-30 MED ORDER — PROCHLORPERAZINE MALEATE 5 MG PO TABS: 5.0000 mg | ORAL_TABLET | Freq: Four times a day (QID) | ORAL | Status: DC | PRN

## 2017-12-30 MED ORDER — PROCHLORPERAZINE EDISYLATE 10 MG/2ML IJ SOLN: 5.0000 mg | Freq: Four times a day (QID) | INTRAMUSCULAR | Status: DC | PRN

## 2017-12-30 MED ORDER — DIPHENHYDRAMINE HCL 12.5 MG/5ML PO ELIX: 12.5000 mg | ORAL_SOLUTION | Freq: Four times a day (QID) | ORAL | Status: DC | PRN

## 2017-12-30 MED ORDER — PRO-STAT SUGAR FREE PO LIQD
30.0000 mL | Freq: Three times a day (TID) | ORAL | Status: DC
  Administered 2017-12-30 – 2018-01-09 (×26): 30 mL via ORAL
  Filled 2017-12-30 (×29): qty 30

## 2017-12-30 MED ORDER — ENOXAPARIN SODIUM 30 MG/0.3ML ~~LOC~~ SOLN
30.0000 mg | SUBCUTANEOUS | Status: DC
  Administered 2017-12-30 – 2018-01-08 (×10): 30 mg via SUBCUTANEOUS
  Filled 2017-12-30 (×10): qty 0.3

## 2017-12-30 MED ORDER — ONDANSETRON HCL 4 MG/2ML IJ SOLN: 4.0000 mg | Freq: Four times a day (QID) | INTRAMUSCULAR | Status: DC | PRN

## 2017-12-30 MED ORDER — INSULIN ASPART 100 UNIT/ML ~~LOC~~ SOLN
0.0000 [IU] | Freq: Every day | SUBCUTANEOUS | Status: DC
  Administered 2017-12-30 – 2018-01-04 (×2): 2 [IU] via SUBCUTANEOUS

## 2017-12-30 MED ORDER — ACETAMINOPHEN 325 MG PO TABS: 325.0000 mg | ORAL_TABLET | ORAL | Status: DC | PRN

## 2017-12-30 MED ORDER — BISACODYL 10 MG RE SUPP: 10.0000 mg | Freq: Every day | RECTAL | Status: DC | PRN

## 2017-12-30 MED ORDER — FLEET ENEMA 7-19 GM/118ML RE ENEM: 1.0000 | ENEMA | Freq: Once | RECTAL | Status: DC | PRN

## 2017-12-30 MED ORDER — DOCUSATE SODIUM 100 MG PO CAPS
200.0000 mg | ORAL_CAPSULE | Freq: Two times a day (BID) | ORAL | Status: DC
  Administered 2017-12-30 – 2018-01-09 (×15): 200 mg via ORAL
  Filled 2017-12-30 (×20): qty 2

## 2017-12-30 MED ORDER — PROCHLORPERAZINE 25 MG RE SUPP: 12.5000 mg | Freq: Four times a day (QID) | RECTAL | Status: DC | PRN

## 2017-12-30 MED ORDER — IPRATROPIUM-ALBUTEROL 0.5-2.5 (3) MG/3ML IN SOLN: 3.0000 mL | RESPIRATORY_TRACT | Status: DC | PRN

## 2017-12-30 MED ORDER — PREDNISONE 20 MG PO TABS
50.0000 mg | ORAL_TABLET | Freq: Every day | ORAL | Status: DC
  Administered 2017-12-31 – 2018-01-02 (×3): 50 mg via ORAL
  Filled 2017-12-30 (×3): qty 2

## 2017-12-30 MED ORDER — VITAMIN B-1 100 MG PO TABS
100.0000 mg | ORAL_TABLET | Freq: Every day | ORAL | Status: DC
  Administered 2017-12-31 – 2018-01-09 (×10): 100 mg via ORAL
  Filled 2017-12-30 (×10): qty 1

## 2017-12-30 MED ORDER — CARVEDILOL 3.125 MG PO TABS
3.1250 mg | ORAL_TABLET | Freq: Two times a day (BID) | ORAL | Status: DC
  Administered 2017-12-30 – 2018-01-09 (×20): 3.125 mg via ORAL
  Filled 2017-12-30 (×20): qty 1
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
## 2017-12-30 NOTE — Progress Notes (Signed)
Inpatient Rehabilitation-Admissions Coordinator   [facility] received medical clearance for admit to CIR today. AC has notified pt, family, floor RN, and CM regarding plans. Please call if questions.   Jhonnie Garner, OTR/L  Rehab Admissions Coordinator  [phone] 12/30/2017 11:12 AM

## 2017-12-30 NOTE — Progress Notes (Signed)
Patient arrived from [facility] via wheelchair, no complaints of pain at this time.

## 2017-12-30 NOTE — Progress Notes (Signed)
PMR Admission Coordinator Pre-Admission Assessment  Patient: Tasha [name] is an 67 y.o., female MRN: [id number] DOB: 04-06-1951 Height: '5\' 1"'$  (154.9 cm) Weight: 66.9 kg                                                                                                                                                  Insurance Information HMO:     PPO:      PCP:      IPA:      80/20: Yes     OTHER:  PRIMARY: Medicare Part A and B      Policy#: [id number]      Subscriber: Patient CM Name:       Phone#:      Fax#:  Pre-Cert#:       Employer:  Benefits:  Phone #: NA     Name: Verified eligibility online via [place]. Date: Part A effective: 05/24/15; Part B effective: 05/24/15     Deduct: $1,364      Out of Pocket Max: NA      Life Max: NA CIR: Covered Per Medicare guidelines once yearly deductible is met      SNF: days 1-20, 100%; days 21-100, 80%  Outpatient: 80%     Co-Pay: 20% Home Health: 100%      Co-Pay:  DME: 80%     Co-Pay: 20% Providers: Pt's choice  SECONDARY:       Policy#:       Subscriber:  CM Name:       Phone#:      Fax#:  Pre-Cert#:       Employer:  Benefits:  Phone #:      Name:  Eff. Date:      Deduct:       Out of Pocket Max:      Life Max:  CIR:       SNF:  Outpatient:      Co-Pay:  Home Health:       Co-Pay:  DME:      Co-Pay:   Medicaid Application Date:       Case Manager:  Disability Application Date:       Case Worker:   Emergency Contact Information         Contact Information    Name Relation Home Work Mobile   Strum Daughter [phone]       Current Medical History  Patient Admitting Diagnosis: debility History of Present Illness: Tasha Murguiais a 67 y.o.non-english speakingfemalewith history of HTN, metabolic syndrome who was admitted from OSH with SOB after possible accidental antifreeze inhalation mid July.History taken from chart review.Work up indicated BOOP/COP and high dose steroids initiated. She developed  increase WOB requiring intubation 8/30 and right chest tube due to PTX likely due to barotrauma and poor lung compliance. Hospital course significant for hypotensive shock,  A fib with RVR, fluid overload, electrolyte abnormality and SOB with hypoxia. Therapy evaluations done revealing deficits in mobility and self care tasks. CIR recommended due to debility. Pt is to be admitted to CIR on 12/30/17.   Past Medical History      Past Medical History:  Diagnosis Date  . Acute respiratory failure ([place])   . CAP (community acquired pneumonia)   . Hyperlipidemia   . Hypertension   . Metabolic syndrome     Family History  family history includes Cancer in her father; Healthy in her brother, brother, and daughter.  Prior Rehab/Hospitalizations:  Has the patient had major surgery during 100 days prior to admission? No  Current Medications   Current Facility-Administered Medications:  .  0.9 %  sodium chloride infusion, , Intravenous, PRN, Rush Farmer, MD .  acetaminophen (TYLENOL) solution 650 mg, 650 mg, Oral, Q6H PRN, Rush Farmer, MD, 650 mg at 12/23/17 1823 .  aspirin chewable tablet 81 mg, 81 mg, Oral, Daily, Thurnell Lose, MD, 81 mg at 12/30/17 1020 .  carvedilol (COREG) tablet 3.125 mg, 3.125 mg, Oral, BID WC, Lala Lund K, MD, 3.125 mg at 12/29/17 1710 .  chlorhexidine (PERIDEX) 0.12 % solution 15 mL, 15 mL, Mouth Rinse, BID, Salvadore Dom E, NP, 15 mL at 12/30/17 1026 .  Chlorhexidine Gluconate Cloth 2 % PADS 6 each, 6 each, Topical, Daily, Icard, Bradley L, DO, 6 each at 12/29/17 0846 .  docusate sodium (COLACE) capsule 200 mg, 200 mg, Oral, BID, Thurnell Lose, MD, 200 mg at 12/30/17 1020 .  enoxaparin (LOVENOX) injection 40 mg, 40 mg, Subcutaneous, Q24H, Ronna Polio, RPH, 40 mg at 12/29/17 2127 .  feeding supplement (PRO-STAT SUGAR FREE 64) liquid 30 mL, 30 mL, Oral, TID WC, Thurnell Lose, MD, 30 mL at 12/30/17 1021 .  ipratropium-albuterol  (DUONEB) 0.5-2.5 (3) MG/3ML nebulizer solution 3 mL, 3 mL, Nebulization, Q4H PRN, Erick Colace, NP .  MEDLINE mouth rinse, 15 mL, Mouth Rinse, q12n4p, Rush Farmer, MD, 15 mL at 12/28/17 1650 .  [DISCONTINUED] ondansetron (ZOFRAN) tablet 4 mg, 4 mg, Oral, Q6H PRN **OR** ondansetron (ZOFRAN) injection 4 mg, 4 mg, Intravenous, Q6H PRN, Salvadore Dom E, NP .  oxyCODONE (Oxy IR/ROXICODONE) immediate release tablet 5-10 mg, 5-10 mg, Oral, Q4H PRN, Frederik Pear, MD, 5 mg at 12/26/17 1056 .  pantoprazole (PROTONIX) EC tablet 40 mg, 40 mg, Oral, Daily, Thurnell Lose, MD, 40 mg at 12/30/17 1020 .  predniSONE (DELTASONE) tablet 50 mg, 50 mg, Oral, Q breakfast, Thurnell Lose, MD, 50 mg at 12/30/17 1020 .  RESOURCE THICKENUP CLEAR, , Oral, PRN, Magdalen Spatz, NP .  sodium chloride flush (NS) 0.9 % injection 10-40 mL, 10-40 mL, Intracatheter, Q12H, Icard, Bradley L, DO, 10 mL at 12/28/17 1000 .  thiamine (VITAMIN B-1) tablet 100 mg, 100 mg, Oral, Daily, Cherene Altes, MD, 100 mg at 12/30/17 1019  Patients Current Diet:     Diet Order                  DIET DYS 3 Room service appropriate? Yes; Fluid consistency: Nectar Thick  Diet effective now               Precautions / Restrictions Precautions Precautions: Fall Precaution Comments: NGT Restrictions Weight Bearing Restrictions: No   Has the patient had 2 or more falls or a fall with injury in the past year?No  Prior Activity Level Community (  5-7x/wk): would garden, perform housework PTA  Development worker, international aid / [place] Devices/Equipment: None Home Equipment: None  Prior Device Use: Indicate devices/aids used by the patient prior to current illness, exacerbation or injury? None of the above  Prior Functional Level Prior Function Level of Independence: Independent Comments: Retired from work. Indep with all mobility, stays active inside the house  Self Care: Did the patient  need help bathing, dressing, using the toilet or eating?  Independent  Indoor Mobility: Did the patient need assistance with walking from room to room (with or without device)? Independent  Stairs: Did the patient need assistance with internal or external stairs (with or without device)? Independent  Functional Cognition: Did the patient need help planning regular tasks such as shopping or remembering to take medications? Independent  Current Functional Level Cognition  Overall Cognitive Status: Within Functional Limits for tasks assessed Difficult to assess due to: Non-English speaking Orientation Level: Oriented X4 General Comments: follow commands, functionally appropriate during session    Extremity Assessment (includes Sensation/Coordination)  Upper Extremity Assessment: Generalized weakness  Lower Extremity Assessment: Defer to PT evaluation    ADLs  Overall ADL's : Needs assistance/impaired Grooming: Set up, Sitting Upper Body Bathing: Sitting, Minimal assistance Lower Body Bathing: Minimal assistance, Sit to/from stand Upper Body Dressing : Minimal assistance, Sitting Lower Body Dressing: Minimal assistance, Sit to/from stand Lower Body Dressing Details (indicate cue type and reason): able to adjust socks, but would need assist to thread; min assist in standing  Toilet Transfer: Minimal assistance, Stand-pivot, +2 for safety/equipment(simulated to recliner ) Toileting- Clothing Manipulation and Hygiene: Moderate assistance, Sit to/from stand Functional mobility during ADLs: Minimal assistance    Mobility  Overal bed mobility: Needs Assistance Bed Mobility: Supine to Sit Supine to sit: Min assist, HOB elevated General bed mobility comments: assist to elevate trunk into sitting and bring hips to EOB.    Transfers  Overall transfer level: Needs assistance Equipment used: Rolling walker (2 wheeled) Transfers: Sit to/from Stand Sit to Stand: Min assist Stand  pivot transfers: Min assist, +2 safety/equipment General transfer comment: Assist to bring hips up and for balance. Verbal cues for hand placement    Ambulation / Gait / Stairs / Wheelchair Mobility  Ambulation/Gait Ambulation/Gait assistance: Min Web designer (Feet): 90 Feet Assistive device: Rolling walker (2 wheeled) Gait Pattern/deviations: Step-through pattern, Decreased step length - right, Decreased step length - left, Shuffle General Gait Details: Assist for balance and support. Began amb on 3L with SpO2 dropping to 87% at 60' so O2 increased to 4L with return of SpO2 to 92%. Pt took 1 standing rest break. Gait velocity: Decreased Gait velocity interpretation: <1.31 ft/sec, indicative of household ambulator    Posture / Balance Balance Overall balance assessment: Needs assistance Sitting-balance support: No upper extremity supported, Feet supported Sitting balance-Leahy Scale: Fair Standing balance support: Single extremity supported, During functional activity Standing balance-Leahy Scale: Poor Standing balance comment: walker and min guard for static standing    Special needs/care consideration BiPAP/CPAP: No-pt's daughter reporting that she would like this to be evaluated (feels pt may have sleep apnea) CPM: no Continuous Drip IV: no Dialysis: no        Days: no Life Vest: no Oxygen: yes, new to Oxygen, on 2L presently Special Bed: no Trach Size: no Wound Vac (area): no      Location: no Skin: moisture associated skin damage to perineum and groin  Bowel mgmt:last BM per pt report, 12/29/17 (last charted 12/28/17). Bladder mgmt: continent Diabetic mgmt: no     Previous Home Environment Living Arrangements: Spouse/significant other Available Help at Discharge: Family, Available 24 hours/day Type of Home: House Home Layout: One level Home Access: Stairs to enter Entrance Stairs-Rails: Right Entrance Stairs-Number of Steps:  3 Bathroom Shower/Tub: Chiropodist: Standard Home Care Services: No Additional Comments: Lives with husband available for 24/7 support. Son works as Diplomatic Services operational officer for [employer] for Discharge Living Setting: Patient's home, Lives with (comment), Mobile Home(lives with spouse) Type of Home at Discharge: Mobile home Discharge Home Layout: One level Discharge Home Access: Stairs to enter Entrance Stairs-Rails: Can reach both Entrance Stairs-Number of Steps: 3 Discharge Bathroom Shower/Tub: Tub/shower unit Discharge Bathroom Toilet: Standard Discharge Bathroom Accessibility: Yes How Accessible: Accessible via walker Does the patient have any problems obtaining your medications?: No  Social/Family/Support Systems Patient Roles: Spouse(has two children) Contact Information: Emergency contact is daughter Retail buyer as Monica" Anticipated Caregiver: spouse, son and daughter Anticipated Caregiver's Contact Information: daughter Verdis Frederickson): cell [phone]; work [phone] Ability/Limitations of Caregiver: all can provide 24/7 A  Caregiver Availability: 24/7 Discharge Plan Discussed with Primary Caregiver: (discussed with daugther and son and pt) Is Caregiver In Agreement with Plan?: Yes Does Caregiver/Family have Issues with Lodging/Transportation while Pt is in Rehab?: No   Goals/Additional Needs Patient/Family Goal for Rehab: PT/OT: Supervision; SLP: NA Expected length of stay: 13-16 days Cultural Considerations: NA Dietary Needs: DYS 3, nectar thick Equipment Needs: TBD Special Service Needs: pt will need translator (Spanish); pt is new to O2, may need training on O2 if DC home to house with O2 needs Pt/Family Agrees to Admission and willing to participate: Yes Program Orientation Provided & Reviewed with Pt/Caregiver Including Roles  & Responsibilities: Yes(reviewed with pt, daughter, and son)  Barriers to Discharge: Home  environment access/layout, New oxygen  Barriers to Discharge Comments: steps to enter home; new to O2   Decrease burden of Care through IP rehab admission: NA   Possible need for SNF placement upon discharge: not anticipated; pt has good prognosis for further progress and has good social support at home.    Patient Condition: This patient's condition remains as documented in the consult dated 12/29/17, in which the Rehabilitation Physician determined and documented that the patient's condition is appropriate for intensive rehabilitative care in an inpatient rehabilitation facility. Will admit to inpatient rehab today.  Preadmission Screen Completed By:  Jhonnie Garner, 12/30/2017 11:03 AM ______________________________________________________________________   Discussed status with Dr. Naaman Plummer on 12/30/17 at 11:04AM and received telephone approval for admission today.  Admission Coordinator:  Jhonnie Garner, time 11:04AM/Date 12/30/17.            Cosigned by: Meredith Staggers, MD at 12/30/2017 12:50 PM  Revision History

## 2017-12-30 NOTE — Discharge Summary (Signed)
DISCHARGE SUMMARY  Tasha [name]  MR#: [id number]  DOB:April 16, 1951  Date of Admission: 12/16/2017 Date of Discharge: 12/30/2017  Attending Physician:Coye Dawood Silvestre Gunner, MD  Patient's XBM:[id number], Tasha Radon, MD  Consults:  PCCM  Disposition: D/C to CIR   Follow-up Appts: To be determined upon d/c from CIR   Tests Needing Follow-up: -30-day Holter monitor at time of d/c from CIR has been suggested by Cards -repeat TSH w/ FT4 and FT3 suggested in 3 to 4 weeks as outpt   Discharge Diagnoses: Acute severe persistent hypoxic respiratory failure ARDS/BOOP possibly caused by antifreeze inhalation  Subacute ILD - Bilateral Consolidation  Transient A. fib with RVR Barotrauma induced right-sided pneumothorax Hypertension Hyperlipidemia Elevated TSH GERD  Initial presentation: 67 yo F w/ a hx of HTN, GERD, and HLD who was admitted to [facility] for acute respiratory failure thought to be due to CAP v/s possible accidental antifreeze inhalation. She did not improve w/ tx for same. Due to her slow improvement w/ prolonged requirement for high O2 support, transfer was sought for Pulmonary consultation.  Hospital Course: 8/27 admit on transfer from [facility] 8/28 High-resolution CT chest > bilateral lower lobe consolidation with groundglass attenuation 8/29 TTE - EF 65-70 % with no WMA and grade 1 diastolic dysfunction 9/05 extubated 9/10 transfer to CIR   Acute severe persistent hypoxic respiratory failure - ARDS/BOOP possibly caused by antifreeze inhalation  was intubated for a week and extubated on 12/25/2017 - much improved at this time - slow weaning of steroids ongoing   Subacute ILD - Bilateral Consolidation High ESR - ANA + in speckled pattern - RVP negative / negative infection workup - ?BOOP/COP - PCCM started empiric tx for BOOP as noted above, w/ planned very slow steroid taper   Brief episode of A. fib with RVR while she was intubated in the ICU - resolved after 48 hours of  amiodarone, TTE noted a preserved EF of 60% with no wall motion abnormality, pulmonary artery pressures were slightly elevated at 54 but right atrium is not dilated - Dr. Duke Salvia w/ Cards suggested low-dose beta-blocker and aspirin, and a 30-day Holter monitor at time of d/c to look for paroxysmal A. Fib - likely this was brief atrial fibrillation due to acute pulmonary stress - TSH is not low   Barotrauma induced right-sided pneumothorax Required chest tube - no recurrence - chest tube has been out   Hypertension BP controlled now - avoid hydralazine as pt had poor reaction  Hyperlipidemia Holding lipitor for now w/ very limited oral intake - resume when intake more consistent   Elevated TSH Recent exposure to amiodarone - prudent to repeat TSH in 3 to 4 weeks by PCP along with free T4 and T3 - this could be sick euthyroid syndrome  GERD   Medications at time of d/c to CIR:  Current Facility-Administered Medications:  .  0.9 %  sodium chloride infusion, , Intravenous, PRN, Alyson Reedy, MD .  acetaminophen (TYLENOL) solution 650 mg, 650 mg, Oral, Q6H PRN, Alyson Reedy, MD, 650 mg at 12/23/17 1823 .  aspirin chewable tablet 81 mg, 81 mg, Oral, Daily, Leroy Sea, MD, 81 mg at 12/30/17 1020 .  carvedilol (COREG) tablet 3.125 mg, 3.125 mg, Oral, BID WC, Susa Raring K, MD, 3.125 mg at 12/29/17 1710 .  chlorhexidine (PERIDEX) 0.12 % solution 15 mL, 15 mL, Mouth Rinse, BID, Zenia Resides E, NP, 15 mL at 12/30/17 1026 .  Chlorhexidine Gluconate Cloth 2 % PADS 6 each, 6  each, Topical, Daily, Icard, Bradley L, DO, 6 each at 12/29/17 0846 .  docusate sodium (COLACE) capsule 200 mg, 200 mg, Oral, BID, Leroy Sea, MD, 200 mg at 12/30/17 1020 .  enoxaparin (LOVENOX) injection 40 mg, 40 mg, Subcutaneous, Q24H, Lawerance Bach, RPH, 40 mg at 12/29/17 2127 .  feeding supplement (PRO-STAT SUGAR FREE 64) liquid 30 mL, 30 mL, Oral, TID WC, Leroy Sea, MD, 30 mL at  12/30/17 1021 .  ipratropium-albuterol (DUONEB) 0.5-2.5 (3) MG/3ML nebulizer solution 3 mL, 3 mL, Nebulization, Q4H PRN, Simonne Martinet, NP .  MEDLINE mouth rinse, 15 mL, Mouth Rinse, q12n4p, Alyson Reedy, MD, 15 mL at 12/28/17 1650 .  [DISCONTINUED] ondansetron (ZOFRAN) tablet 4 mg, 4 mg, Oral, Q6H PRN **OR** ondansetron (ZOFRAN) injection 4 mg, 4 mg, Intravenous, Q6H PRN, Zenia Resides E, NP .  oxyCODONE (Oxy IR/ROXICODONE) immediate release tablet 5-10 mg, 5-10 mg, Oral, Q4H PRN, Migdalia Dk, MD, 5 mg at 12/26/17 1056 .  pantoprazole (PROTONIX) EC tablet 40 mg, 40 mg, Oral, Daily, Leroy Sea, MD, 40 mg at 12/30/17 1020 .  predniSONE (DELTASONE) tablet 50 mg, 50 mg, Oral, Q breakfast, Leroy Sea, MD, 50 mg at 12/30/17 1020 .  RESOURCE THICKENUP CLEAR, , Oral, PRN, Bevelyn Ngo, NP .  sodium chloride flush (NS) 0.9 % injection 10-40 mL, 10-40 mL, Intracatheter, Q12H, Icard, Bradley L, DO, 10 mL at 12/28/17 1000 .  thiamine (VITAMIN B-1) tablet 100 mg, 100 mg, Oral, Daily, Jetty Duhamel T, MD, 100 mg at 12/30/17 1019  Day of Discharge BP (!) 107/56 (BP Location: Right Arm)   Pulse 69   Temp 98.1 F (36.7 C) (Oral)   Resp 16   Ht 5\' 1"  (1.549 m)   Wt 66.9 kg   SpO2 97%   BMI 27.87 kg/m   Physical Exam: General: No acute respiratory distress Lungs: Clear to auscultation bilaterally without wheezes or crackles Cardiovascular: Regular rate and rhythm without murmur gallop or rub normal S1 and S2 Abdomen: Nontender, nondistended, soft, bowel sounds positive, no rebound, no ascites, no appreciable mass Extremities: No significant cyanosis, clubbing, or edema bilateral lower extremities  Basic Metabolic Panel: Recent Labs  Lab 12/24/17 0455 12/25/17 0500 12/26/17 0407 12/27/17 0558 12/28/17 0441 12/29/17 0410 12/30/17 0509  NA 152* 154* 156* 153* 143 144 142  K 4.3 4.0 4.4 4.5 4.3 3.8 3.6  CL 112* 111 112* 110 105 106 106  CO2 30 32 34* 34* 31 32 31    GLUCOSE 222* 188* 196* 175* 212* 106* 108*  BUN 74* 73* 72* 61* 59* 47* 33*  CREATININE 1.15* 1.05* 1.09* 0.96 0.86 0.88 0.91  CALCIUM 8.9 9.0 9.2 9.0 8.4* 8.4* 8.1*  MG 2.4 2.3 2.4 2.3 2.1 2.1  --   PHOS 2.8 3.8 4.6 4.6 3.7  --   --     Liver Function Tests: Recent Labs  Lab 12/29/17 0410 12/30/17 0509  AST 24 31  ALT 37 57*  ALKPHOS 29* 34*  BILITOT 0.7 0.6  PROT 4.2* 4.2*  ALBUMIN 2.1* 2.2*    CBC: Recent Labs  Lab 12/24/17 0455  12/26/17 0407 12/27/17 0558 12/28/17 0441 12/29/17 0410 12/30/17 0509  WBC 7.9   < > 12.2* 12.5* 10.9* 9.3 8.5  NEUTROABS 6.7  --   --   --   --   --   --   HGB 10.1*   < > 11.7* 11.5* 10.9* 9.6* 9.2*  HCT 33.6*   < >  39.6 38.0 35.6* 30.9* 29.5*  MCV 91.3   < > 93.2 92.9 91.3 89.3 89.9  PLT 157   < > 227 214 169 137* 102*   < > = values in this interval not displayed.   CBG: Recent Labs  Lab 12/28/17 2011 12/28/17 2237 12/29/17 0558 12/29/17 1211 12/29/17 1654  GLUCAP 174* 76 86 160* 252*    Time spent in discharge (includes decision making & examination of pt): 35 minutes  12/30/2017, 11:14 AM   Lonia Blood, MD [facility] Office  [phone]  On-Call/Text Page:      [email or web address]
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
## 2017-12-30 NOTE — Progress Notes (Signed)
Physical Medicine and Rehabilitation Consult   Reason for Consult: Debility.  Referring Physician: Dr. Thereasa Solo.    HPI: Tasha [name] is a 67 y.o. non-english speaking female with history of HTN, metabolic syndrome who was admitted from OSH with SOB after possible accidental antifreeze inhalation mid July. History taken from chart review. Work up indicated BOOP/COP and high dose steroids initiated. She developed increase WOB requiring intubation 8/30 and right chest tube due to PTX likely due to barotrauma and poor lung compliance.  Hospital course significant for hypotensive shock, A fib with RVR, fluid overload, electrolyte abnormality and SOB with hypoxia.  Therapy evaluations done revealing deficits in mobility and self care tasks. CIR recommended due to debility.   Review of Systems  Unable to perform ROS: Language         Past Medical History:  Diagnosis Date  . Acute respiratory failure ([place])   . CAP (community acquired pneumonia)   . Hyperlipidemia   . Hypertension   . Metabolic syndrome          Past Surgical History:  Procedure Laterality Date  . CESAREAN SECTION           Family History  Problem Relation Age of Onset  . Cancer Father   . Healthy Brother   . Healthy Daughter   . Healthy Brother     Social History:  Married. 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 She reports that she has never smoked. She has never used smokeless tobacco. She reports that she does not drink alcohol or use drugs.         Allergies  Allergen Reactions  . Homatropine     Pt doesn't remember  . Hydromet [Hydrocodone-Homatropine]     Pt doesn't remember  . Amoxicillin Rash          Medications Prior to Admission  Medication Sig Dispense Refill  . albuterol (PROVENTIL HFA;VENTOLIN HFA) 108 (90 Base) MCG/ACT inhaler Inhale 2 puffs into the lungs every 6 (six) hours as needed for wheezing or  shortness of breath. 1 Inhaler 2  . atorvastatin (LIPITOR) 40 MG tablet TAKE (1) TABLET BY MOUTH ONCE DAILY. (Patient taking differently: Take 40 mg by mouth daily. ) 30 tablet 2  . benzonatate (TESSALON) 200 MG capsule Take 1 capsule (200 mg total) by mouth 2 (two) times daily as needed for cough. 20 capsule 0  . cetirizine (ZYRTEC) 10 MG tablet Take 10 mg by mouth daily.    Marland Kitchen escitalopram (LEXAPRO) 10 MG tablet TAKE 1 TABLET DAILY. 30 tablet 2  . guaiFENesin (MUCINEX) 600 MG 12 hr tablet Take 600 mg by mouth 2 (two) times daily.    Marland Kitchen lisinopril-hydrochlorothiazide (PRINZIDE,ZESTORETIC) 20-25 MG tablet TAKE (1) TABLET BY MOUTH ONCE DAILY. (Patient taking differently: Take 1 tablet by mouth daily. ) 30 tablet 0  . pantoprazole (PROTONIX) 20 MG tablet TAKE (1) TABLET BY MOUTH ONCE DAILY. (Patient taking differently: Take 20 mg by mouth daily. ) 30 tablet 2  . fluconazole (DIFLUCAN) 100 MG tablet Take 100 mg by mouth daily. x7 days      Home: Home Living Family/patient expects to be discharged to:: Private residence Living Arrangements: Spouse/significant other Available Help at Discharge: Family, Available 24 hours/day Type of Home: House Home Access: Stairs to enter [employer] of Steps: 3 Entrance Stairs-Rails: Right Home Layout: One level Bathroom Shower/Tub: Chiropodist: [place]: None Additional Comments: Lives with husband available for  24/7 support. Son works as Diplomatic Services operational officer for [employer] History: Prior Function Level of Independence: Independent Comments: Retired from work. Indep with all mobility, stays active inside the house Functional Status:  Mobility: Bed Mobility Overal bed mobility: Needs Assistance Bed Mobility: Supine to Sit Supine to sit: Min assist, HOB elevated General bed mobility comments: MinA  to scoot hips to EOB Transfers Overall transfer level: Needs assistance Equipment used: 1 person hand  held assist Transfers: Sit to/from Stand, Stand Pivot Transfers Sit to Stand: Min assist, +2 safety/equipment Stand pivot transfers: Min assist, +2 safety/equipment General transfer comment: min assist for safety and balance Ambulation/Gait Ambulation/Gait assistance: Min assist, +2 safety/equipment Gait Distance (Feet): 15 Feet Assistive device: Rolling walker (2 wheeled) Gait Pattern/deviations: Step-to pattern General Gait Details: Very slowed amb with RW and minA+2. Frequent standing rest breaks with SpO2 down to 81% on 3LO2 [place], up to 88% on 5LO2 [place]. RN with chair follow as pt quickly fatigued. C/o dizziness, BP stable Gait velocity: Decreased Gait velocity interpretation: <1.31 ft/sec, indicative of household ambulator  ADL: ADL Overall ADL's : Needs assistance/impaired Grooming: Set up, Sitting Upper Body Bathing: Sitting, Minimal assistance Lower Body Bathing: Minimal assistance, Sit to/from stand Upper Body Dressing : Minimal assistance, Sitting Lower Body Dressing: Minimal assistance, Sit to/from stand Lower Body Dressing Details (indicate cue type and reason): able to adjust socks, but would need assist to thread; min assist in standing  Toilet Transfer: Minimal assistance, Stand-pivot, +2 for safety/equipment(simulated to recliner ) Toileting- Clothing Manipulation and Hygiene: Moderate assistance, Sit to/from stand Functional mobility during ADLs: Minimal assistance  Cognition: Cognition Overall Cognitive Status: Difficult to assess Orientation Level: Oriented X4 Cognition Arousal/Alertness: Awake/alert Behavior During Therapy: Flat affect Overall Cognitive Status: Difficult to assess General Comments: follow commands, functionally appropriate during session Difficult to assess due to: Non-English speaking   Blood pressure (!) 100/56, pulse 65, temperature 98 F (36.7 C), temperature source Oral, resp. rate 19, height 5\' 1"  (1.549 m), weight 70.7 kg, SpO2 99  %. Physical Exam  Nursing note and vitals reviewed. Constitutional: She appears well-developed and well-nourished. Nasal cannula in place.  HENT:  Head: Normocephalic and atraumatic.  Eyes: EOM are normal. Right eye exhibits no discharge. Left eye exhibits no discharge.  Neck: Normal range of motion. Neck supple.  Cardiovascular: Normal rate and regular rhythm.  Respiratory:  Pursed lip breathing noted.  + [place].  GI: Soft. Bowel sounds are normal.  Musculoskeletal:  No edema or tenderness in extremities  Neurological: She is alert.  Motor: Limited by language, >/ 3/5 throughout dysphonia  Skin: Skin is warm and dry.  Psychiatric:  Unable to assess due to language    LabResultsLast24Hours  Results for orders placed or performed during the hospital encounter of 12/16/17 (from the past 24 hour(s))  TSH     Status: Abnormal   Collection Time: 12/28/17 11:48 AM  Result Value Ref Range   TSH 12.078 (H) 0.350 - 4.500 uIU/mL  Glucose, capillary     Status: Abnormal   Collection Time: 12/28/17 12:21 PM  Result Value Ref Range   Glucose-Capillary 126 (H) 70 - 99 mg/dL  Glucose, capillary     Status: Abnormal   Collection Time: 12/28/17  4:44 PM  Result Value Ref Range   Glucose-Capillary 139 (H) 70 - 99 mg/dL  Glucose, capillary     Status: Abnormal   Collection Time: 12/28/17  8:11 PM  Result Value Ref Range   Glucose-Capillary 174 (H) 70 - 99  mg/dL  Glucose, capillary     Status: None   Collection Time: 12/28/17 10:37 PM  Result Value Ref Range   Glucose-Capillary 76 70 - 99 mg/dL  CBC     Status: Abnormal   Collection Time: 12/29/17  4:10 AM  Result Value Ref Range   WBC 9.3 4.0 - 10.5 K/uL   RBC 3.46 (L) 3.87 - 5.11 MIL/uL   Hemoglobin 9.6 (L) 12.0 - 15.0 g/dL   HCT 30.9 (L) 36.0 - 46.0 %   MCV 89.3 78.0 - 100.0 fL   MCH 27.7 26.0 - 34.0 pg   MCHC 31.1 30.0 - 36.0 g/dL   RDW 15.2 11.5 - 15.5 %   Platelets 137 (L) 150 - 400 K/uL  Comprehensive  metabolic panel     Status: Abnormal   Collection Time: 12/29/17  4:10 AM  Result Value Ref Range   Sodium 144 135 - 145 mmol/L   Potassium 3.8 3.5 - 5.1 mmol/L   Chloride 106 98 - 111 mmol/L   CO2 32 22 - 32 mmol/L   Glucose, Bld 106 (H) 70 - 99 mg/dL   BUN 47 (H) 8 - 23 mg/dL   Creatinine, Ser 0.88 0.44 - 1.00 mg/dL   Calcium 8.4 (L) 8.9 - 10.3 mg/dL   Total Protein 4.2 (L) 6.5 - 8.1 g/dL   Albumin 2.1 (L) 3.5 - 5.0 g/dL   AST 24 15 - 41 U/L   ALT 37 0 - 44 U/L   Alkaline Phosphatase 29 (L) 38 - 126 U/L   Total Bilirubin 0.7 0.3 - 1.2 mg/dL   GFR calc non Af Amer >60 >60 mL/min   GFR calc Af Amer >60 >60 mL/min   Anion gap 6 5 - 15  Magnesium     Status: None   Collection Time: 12/29/17  4:10 AM  Result Value Ref Range   Magnesium 2.1 1.7 - 2.4 mg/dL  Glucose, capillary     Status: None   Collection Time: 12/29/17  5:58 AM  Result Value Ref Range   Glucose-Capillary 86 70 - 99 mg/dL     ImagingResults(Last48hours)  No results found.    Assessment/Plan: Diagnosis: debility Labs independently reviewed.  Records reviewed and summated above.  1. Does the need for close, 24 hr/day medical supervision in concert with the patient's rehab needs make it unreasonable for this patient to be served in a less intensive setting? Yes  2. Co-Morbidities requiring supervision/potential complications: hypotensive shock, A fib (continue meds, monitor heart rate with increased physical activity), fluid overload, HTN (monitor and provide prns in accordance with increased physical exertion and pain), metabolic syndrome, tachypnea (monitor RR and O2 Sats with increased physical exertion), steroid-induced hyperglycemia (Monitor in accordance with exercise and adjust meds as necessary), hypoalbuminemia (maximize nutrition for overall health and wound healing), ABLA (transfuse if necessary to ensure appropriate perfusion for increased activity tolerance), with supplemental  oxygen as tolerated 3. Due to bladder management, safety, disease management and patient education, does the patient require 24 hr/day rehab nursing? Yes 4. Does the patient require coordinated care of a physician, rehab nurse, PT (1-2 hrs/day, 5 days/week) and OT (1-2 hrs/day, 5 days/week) to address physical and functional deficits in the context of the above medical diagnosis(es)? Yes Addressing deficits in the following areas: balance, endurance, locomotion, strength, transferring, bathing, dressing, toileting and psychosocial support 5. Can the patient actively participate in an intensive therapy program of at least 3 hrs of therapy per day at least 5  days per week? Potentially 6. The potential for patient to make measurable gains while on inpatient rehab is excellent 7. Anticipated functional outcomes upon discharge from inpatient rehab are supervision  with PT, supervision with OT, n/a with SLP. 8. Estimated rehab length of stay to reach the above functional goals is: 13-16 days. 9. Anticipated D/C setting: Home 10. Anticipated post D/C treatments: HH therapy and Home excercise program 11. Overall Rehab/Functional Prognosis: excellent  RECOMMENDATIONS: This patient's condition is appropriate for continued rehabilitative care in the following setting: CIR when able to tolerate 3 hours of therapy a day. Patient has agreed to participate in recommended program. Potentially Note that insurance prior authorization may be required for reimbursement for recommended care.  Comment: Rehab Admissions Coordinator to follow up.   I have personally performed a face to face diagnostic evaluation, including, but not limited to relevant history and physical exam findings, of this patient and developed relevant assessment and plan.  Additionally, I have reviewed and concur with the physician assistant's documentation above.   Delice Lesch, MD, ABPMR Bary Leriche, PA-C 12/29/2017        Revision  History                        Routing History

## 2017-12-30 NOTE — Progress Notes (Signed)
Report called to RN on Tasha [name].

## 2017-12-30 NOTE — IPOC Note (Addendum)
Overall Plan of Care [facility]) Patient Details Name: Tasha [name] MRN: [id number] DOB: 11-22-1950  Admitting Diagnosis: debility  Hospital Problems: Active Problems:   Debility   Persistent atrial fibrillation ([place])   Prerenal azotemia   Dysphagia   Thrombocytopenia ([facility])   Transaminitis   Hypokalemia   Prediabetes   Urinary retention   History of atrial fibrillation   Interstitial lung disease ([place])   Hypotension     Functional Problem List: Nursing Bowel, Endurance, Medication Management, Nutrition, Pain, Perception, Safety, Skin Integrity  PT Balance, Edema, Endurance, Nutrition, Pain, Safety, Skin Integrity  OT Balance, Endurance  SLP Cognition  TR         Basic ADL's: OT Grooming, Bathing, Dressing, Toileting     Advanced  ADL's: OT Simple Meal Preparation     Transfers: PT Bed Mobility, Bed to Chair, Car, Furniture, Floor  OT Toilet, Metallurgist: PT Ambulation, Wheelchair Mobility     Additional Impairments: OT None  SLP Swallowing, Social Cognition   Memory  TR      Anticipated Outcomes Item Anticipated Outcome  Self Feeding independent  Swallowing  mod I    Basic self-care  supervision   Toileting  supervision   Bathroom Transfers supervision  Bowel/Bladder  patient will be continent of bowel and bladder  Transfers  Mod I with LRAD   Locomotion  SUpervision assist ambulatory with LRAD   Communication     Cognition  mod I   Pain  pain less than or equal to 4/10 with min assist  Safety/Judgment  patient will be free from falls/injury ands displaying sound safety decisions   Therapy Plan: PT Intensity: Minimum of 1-2 x/day ,45 to 90 minutes PT Frequency: 5 out of 7 days PT Duration Estimated Length of Stay: 10-14 days  OT Intensity: Minimum of 1-2 x/day, 45 to 90 minutes OT Frequency: 5 out of 7 days OT Duration/Estimated Length of Stay: 10-14 days  SLP Intensity: Minumum of 1-2 x/day, 30 to 90 minutes SLP  Frequency: 3 to 5 out of 7 days SLP Duration/Estimated Length of Stay: 9/24    Team Interventions: Nursing Interventions Patient/Family Education, Bladder Management, Bowel Management, Disease Management/Prevention, Pain Management, Medication Management, Skin Care/Wound Management, Cognitive Remediation/Compensation, Dysphagia/Aspiration Precaution Training, Discharge Planning  PT interventions Ambulation/gait training, Balance/vestibular training, Cognitive remediation/compensation, Disease management/prevention, Discharge planning, Community reintegration, DME/adaptive equipment instruction, Functional electrical stimulation, Functional mobility training, Patient/family education, Pain management, Neuromuscular re-education, Psychosocial support, Therapeutic Activities, Skin care/wound management, Therapeutic Exercise, Stair training, UE/LE Coordination activities, Visual/perceptual remediation/compensation, UE/LE Strength taining/ROM, Wheelchair propulsion/positioning  OT Interventions Training and development officer, Academic librarian, Discharge planning, Engineer, drilling, Patient/family education, Self Care/advanced ADL retraining, Therapeutic Exercise, UE/LE Strength taining/ROM, Therapeutic Activities, Disease mangement/prevention  SLP Interventions Cognitive remediation/compensation, English as a second language teacher, Dysphagia/aspiration precaution training, Patient/family education, Environmental controls, Internal/external aids  TR Interventions    SW/CM Interventions Discharge Planning, Patient/Family Education   Barriers to Discharge MD  Medical stability  Nursing      PT Inaccessible home environment, Home environment Child psychotherapist, New oxygen    OT      SLP      SW       Team Discharge Planning: Destination: PT-Home ,OT- Home , SLP-Home Projected Follow-up: PT-Home health PT, OT-  Other (comment)(TBD ), SLP-Other (comment)(TBD pending progress made while  inpatient) Projected Equipment Needs: PT-Wheelchair (measurements), Wheelchair cushion (measurements), Rolling walker with 5" wheels, OT- 3 in 1 bedside comode, To be determined, SLP-None recommended by SLP Equipment Details: PT- ,  OT-will continue to assess for shower equipment needs  Patient/family involved in discharge planning: PT- Patient, Family member/caregiver,  OT-Patient, SLP-Patient, Family member/caregiver  MD ELOS: 8-12 days. Medical Rehab Prognosis:  Excellent Assessment: 67 year old non-English-speaking female with history of HTN, metabolic syndrome; who was admitted from OSH with high oxygenation needswithshortness of breath after possible accidental antifreeze inhalation injury mid July. Work-up indicated BOOP/CT for PE and high-dose steroids initiated. She developed increasing WOB requiring intubation on 830 and right chest tube due to pneumothorax likely from barotrauma and poor lung compliance. She tolerated extubation without difficulty and slow 4-6 weeks slowsteroid taper recommended due to subacuteILD.Hospital course significant forhypotensive shock,A. fib with RVR treated with amiodarone drip and beta-blocker, electrolyte abnormalities, hypoglycemia, hyponatremia, hypoxia with activity. 30 day holter monitor at discharge recommended per hospitalist. Patient with resulting functional deficits with mobility, endurance, self-care.  Will set goals for supervision with PT/OT.   See Team Conference Notes for weekly updates to the plan of care

## 2017-12-30 NOTE — Care Management Note (Signed)
Case Management Note Marvetta Gibbons RN, BSN Unit 4E- RN Care Coordinator  [phone]  Patient Details  Name: Tasha [name] MRN: [id number] Date of Birth: 1950-06-08  Subjective/Objective:   Pt admitted with acute resp. Failure, tx from [facility] with possible CAP vs accidental antifreeze inhalation. Improved with vent support, extubated on 9/5                 Action/Plan: PTA pt lived at home with family, per PT/OT evvals, CIR consulted for possible admission.   Expected Discharge Date:  12/30/17               Expected Discharge Plan:  [name]  In-House Referral:  Clinical Social Work  Discharge planning Services  CM Consult  Post Acute Care Choice:  NA Choice offered to:     DME Arranged:    DME Agency:     HH Arranged:    [place] Agency:     Status of Service:  Completed, signed off  If discussed at [employer], dates discussed:    Discharge Disposition: IP rehab   Additional Comments:  12/30/17- 1200- Marvetta Gibbons RN, CM- pt has been approved and has bed available for IP rehab today. Plan to transition to [facility] IP rehab later today.   Dawayne Patricia, RN 12/30/2017, 2:56 PM

## 2017-12-31 ENCOUNTER — Inpatient Hospital Stay (HOSPITAL_COMMUNITY): Payer: Medicare Other | Admitting: Occupational Therapy

## 2017-12-31 ENCOUNTER — Inpatient Hospital Stay (HOSPITAL_COMMUNITY): Payer: Medicare Other | Admitting: Physical Therapy

## 2017-12-31 DIAGNOSIS — R1310 Dysphagia, unspecified: Secondary | ICD-10-CM

## 2017-12-31 DIAGNOSIS — Z8679 Personal history of other diseases of the circulatory system: Secondary | ICD-10-CM

## 2017-12-31 DIAGNOSIS — R7401 Elevation of levels of liver transaminase levels: Secondary | ICD-10-CM

## 2017-12-31 DIAGNOSIS — Z9981 Dependence on supplemental oxygen: Secondary | ICD-10-CM

## 2017-12-31 DIAGNOSIS — E876 Hypokalemia: Secondary | ICD-10-CM

## 2017-12-31 DIAGNOSIS — E46 Unspecified protein-calorie malnutrition: Secondary | ICD-10-CM

## 2017-12-31 DIAGNOSIS — R7303 Prediabetes: Secondary | ICD-10-CM

## 2017-12-31 DIAGNOSIS — T380X5A Adverse effect of glucocorticoids and synthetic analogues, initial encounter: Secondary | ICD-10-CM

## 2017-12-31 DIAGNOSIS — R739 Hyperglycemia, unspecified: Secondary | ICD-10-CM

## 2017-12-31 DIAGNOSIS — I10 Essential (primary) hypertension: Secondary | ICD-10-CM

## 2017-12-31 DIAGNOSIS — R339 Retention of urine, unspecified: Secondary | ICD-10-CM

## 2017-12-31 DIAGNOSIS — D696 Thrombocytopenia, unspecified: Secondary | ICD-10-CM

## 2017-12-31 DIAGNOSIS — R740 Nonspecific elevation of levels of transaminase and lactic acid dehydrogenase [LDH]: Secondary | ICD-10-CM

## 2017-12-31 LAB — GLUCOSE, CAPILLARY
GLUCOSE-CAPILLARY: 108 mg/dL — AB (ref 70–99)
Glucose-Capillary: 81 mg/dL (ref 70–99)
Glucose-Capillary: 226 mg/dL — ABNORMAL HIGH (ref 70–99)
Glucose-Capillary: 144 mg/dL — ABNORMAL HIGH (ref 70–99)

## 2017-12-31 LAB — COMPREHENSIVE METABOLIC PANEL
ALBUMIN: 2.1 g/dL — AB (ref 3.5–5.0)
ALK PHOS: 31 U/L — AB (ref 38–126)
ALT: 68 U/L — ABNORMAL HIGH (ref 0–44)
AST: 36 U/L (ref 15–41)
Anion gap: 8 (ref 5–15)
BUN: 26 mg/dL — AB (ref 8–23)
CALCIUM: 8.2 mg/dL — AB (ref 8.9–10.3)
CHLORIDE: 105 mmol/L (ref 98–111)
CO2: 28 mmol/L (ref 22–32)
CREATININE: 0.75 mg/dL (ref 0.44–1.00)
GFR calc Af Amer: >60 mL/min (ref >60)
GFR calc non Af Amer: >60 mL/min (ref >60)
GLUCOSE: 88 mg/dL (ref 70–99)
Potassium: 3.4 mmol/L — ABNORMAL LOW (ref 3.5–5.1)
SODIUM: 141 mmol/L (ref 135–145)
Total Bilirubin: 0.6 mg/dL (ref 0.3–1.2)
Total Protein: 4.2 g/dL — ABNORMAL LOW (ref 6.5–8.1)

## 2017-12-31 LAB — CBC WITH DIFFERENTIAL/PLATELET
ABS IMMATURE GRANULOCYTES: 0.3 10*3/uL — AB (ref 0.0–0.1)
BASOS PCT: 0 %
Basophils Absolute: 0 10*3/uL (ref 0.0–0.1)
Eosinophils Absolute: 0.1 10*3/uL (ref 0.0–0.7)
Eosinophils Relative: 1 %
HCT: 29.9 % — ABNORMAL LOW (ref 36.0–46.0)
HEMOGLOBIN: 9.4 g/dL — AB (ref 12.0–15.0)
IMMATURE GRANULOCYTES: 4 %
LYMPHS PCT: 15 %
Lymphs Abs: 1.2 10*3/uL (ref 0.7–4.0)
MCH: 27.9 pg (ref 26.0–34.0)
MCHC: 31.4 g/dL (ref 30.0–36.0)
MCV: 88.7 fL (ref 78.0–100.0)
Monocytes Absolute: 1 10*3/uL (ref 0.1–1.0)
Monocytes Relative: 11 %
NEUTROS ABS: 5.7 10*3/uL (ref 1.7–7.7)
NEUTROS PCT: 69 %
PLATELETS: 97 10*3/uL — AB (ref 150–400)
RBC: 3.37 MIL/uL — ABNORMAL LOW (ref 3.87–5.11)
RDW: 14.6 % (ref 11.5–15.5)
WBC: 8.4 10*3/uL (ref 4.0–10.5)

## 2017-12-31 LAB — HEMOGLOBIN A1C
HEMOGLOBIN A1C: 6.3 % — AB (ref 4.8–5.6)
Mean Plasma Glucose: 134.11 mg/dL

## 2017-12-31 NOTE — Progress Notes (Signed)
Per nursing, patient was given "Data Collection Information Summary for Patients in Inpatient Rehabilitation Facilities with attached [place] Records" upon admission.    Patient information reviewed and entered into eRehab System by Ileana Ladd, covering PPS coordinator. Information including medical coding and functional independence measure will be reviewed and updated through discharge.

## 2017-12-31 NOTE — Progress Notes (Signed)
[place] PHYSICAL MEDICINE & REHABILITATION     PROGRESS NOTE  Subjective/Complaints:  Patient seen lying in bed this morning. Daughter at bedside, translates. Patient states she slept well overnight. She states she is ready to begin therapies today.  ROS: denies CP, SOB, nausea, vomiting, diarrhea.  Objective: Vital Signs: Blood pressure 117/67, pulse 61, temperature 98 F (36.7 C), temperature source Oral, resp. rate 16, weight 67 kg, SpO2 99 %. No results found. Recent Labs    12/30/17 0509 12/31/17 0440  WBC 8.5 8.4  HGB 9.2* 9.4*  HCT 29.5* 29.9*  PLT 102* 97*   Recent Labs    12/30/17 0509 12/31/17 0440  NA 142 141  K 3.6 3.4*  CL 106 105  GLUCOSE 108* 88  BUN 33* 26*  CREATININE 0.91 0.75  CALCIUM 8.1* 8.2*   CBG (last 3)  Recent Labs    12/30/17 1645 12/30/17 2020 12/31/17 0644  GLUCAP 158* 219* 81    Wt Readings from Last 3 Encounters:  12/31/17 67 kg  12/30/17 66.9 kg  12/05/17 68.5 kg    Physical Exam:  BP 117/67 (BP Location: Right Arm)   Pulse 61   Temp 98 F (36.7 C) (Oral)   Resp 16   Wt 67 kg   SpO2 99%   BMI 27.91 kg/m  Constitutional: She appearswell-developedand well-nourished.  HENT:normocephalic. Atraumatic Eyes:EOMI. No discharge. Cardiovascular:Normal rateand regular rhythm. No JVD. Respiratory:Effort normaland breath sounds normal. + [place]. FO:YDXAJ sounds are normal. She exhibitsno distension.  Musculoskeletal: No edema or tenderness in extremities Neurological: Dysphonia  English is limited but cognitively appears intact.  Motor: Grossly 4+/5 proximal to distal  Skin: Skin iswarm.  Psychiatric: She has anormal mood and affect. Herbehavior is normal.  Assessment/Plan: 1. Functional deficits secondary to debility which require 3+ hours per day of interdisciplinary therapy in a comprehensive inpatient rehab setting. Physiatrist is providing close team supervision and 24 hour management of active medical  problems listed below. Physiatrist and rehab team continue to assess barriers to discharge/monitor patient progress toward functional and medical goals.  Function:  Bathing Bathing position      Bathing parts      Bathing assist        Upper Body Dressing/Undressing Upper body dressing                    Upper body assist        Lower Body Dressing/Undressing Lower body dressing                                  Lower body assist        Toileting Toileting          Toileting assist     Transfers Chair/bed transfer             Locomotion Ambulation           Wheelchair          Cognition Comprehension    Expression    Social Interaction    Problem Solving    Memory       Medical Problem List and Plan: 1.Functional and mobility deficitssecondary to debility after multiple medical issues  Begin CIR  Notes reviewed - inhalation injury, labs reviewed 2. DVT Prophylaxis/Anticoagulation: Pharmaceutical:Lovenox  HIT ordered 3.DJD bilateral knees/Pain Management:tylenol prn with local measures. 4. Mood:LCSW to follow for evaluation and support. 5. Neuropsych: This patientiscapable of  making decisions on herown behalf. 6. Skin/Wound Care:routine pressure relief measures 7. Fluids/Electrolytes/Nutrition:Monitor I/O.   D3 nectars, advanced that his tolerated 8. MBW:GYKZLDJ BP bid. On coreg bid. Prinzide on hold due to hypotension.  Monitor with increased mobility 9. GERD:Continue Protonix. 10. Episode of A fib with RVR: Due to stress and resolved with amiodarone. Sick euthyroid--will need TSH repeated in 3-4 weeks.Will need 30 day monitor at discharge 11. Hyperlipidemia: Lipitor on hold due to poor intake. 12. Subacute ILD: Slow steroid taper 13. Urinary retention?:   Monitor PVR X 48 Hours, pending.  14. Steroid induced hyperglycemia on prediabetes:   Hgb A1C 6.3  Will change to Carb modified diet when  patient on regular consistencies, continue CBGs  Monitor BS ac/hs and use SSI for elevated BS.  16. Pre-renal azotemia: Encourage nectar fluid intake.  Improving, creatinine 0.75 on 9/11 17. Hypokalemia  Potassium 3.4 on 9/11  Supplemented x1 day 18. Hypoalbuminemia  Supplement initiated on 9/11 19. Transaminitis  ALT elevated on 9/11  Continue to monitor 20. Acute blood loss anemia on ? Anemia of chronic disease  Hemoglobin 9.4 on 9/11  Continue to monitor 21. Thrombocytopenia  Platelets 9.4 on 9/11  Continue to monitor 22. Supplemental oxygen dependent  Wean as tolerated  LOS (Days) 1 A FACE TO FACE EVALUATION WAS PERFORMED  Ankit Lorie Phenix 12/31/2017 8:21 AM
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
## 2018-01-01 ENCOUNTER — Inpatient Hospital Stay (HOSPITAL_COMMUNITY): Payer: Medicare Other | Admitting: Occupational Therapy

## 2018-01-01 ENCOUNTER — Inpatient Hospital Stay (HOSPITAL_COMMUNITY): Payer: Medicare Other | Admitting: Physical Therapy

## 2018-01-01 ENCOUNTER — Inpatient Hospital Stay (HOSPITAL_COMMUNITY): Payer: Medicare Other | Admitting: Speech Pathology

## 2018-01-01 LAB — GLUCOSE, CAPILLARY
GLUCOSE-CAPILLARY: 171 mg/dL — AB (ref 70–99)
Glucose-Capillary: 81 mg/dL (ref 70–99)
Glucose-Capillary: 117 mg/dL — ABNORMAL HIGH (ref 70–99)
Glucose-Capillary: 173 mg/dL — ABNORMAL HIGH (ref 70–99)

## 2018-01-01 LAB — HEPARIN INDUCED PLATELET AB (HIT ANTIBODY): Heparin Induced Plt Ab: 0.234 OD (ref 0.000–0.400)

## 2018-01-01 NOTE — Progress Notes (Signed)
Physical Therapy Session Note  Patient Details  Name: Tasha [name] MRN: [id number] Date of Birth: 31-Jul-1950  Today's Date: 01/01/2018 PT Individual Time: 1010-1120 PT Individual Time Calculation (min): 70 min   Short Term Goals: Week 1:  PT Short Term Goal 1 (Week 1): Pt will perform bed mobility with supervision Assist  PT Short Term Goal 2 (Week 1): Pt will ambulate 56f with CGA and LRAD  PT Short Term Goal 3 (Week 1): WC mobility x 1067fwith CGA  PT Short Term Goal 4 (Week 1): Pt will perform stand pivot transfers with supervision assist and LRAD   Skilled Therapeutic Interventions/Progress Updates:   Pt received sitting in recliner and agreeable to PT. Stand pivot transfer with CGA throughout treatment.   Nustep cardiopulmonary endurance training 5 min + 3 min. Min cues for full ROM bil and improved pursed lip breathing to maintain O2 saturation.   Gait training with RW for 30 ft and 50 ft with supervision assist. Min cues for safety in turns and improved step length to reduce overall energy expenditure. SpO2 >97$% non 2 L./min O2. PT decreased to 1 L/min   PT instructed pt in Berg balance Scale. Patient demonstrates increased fall risk as noted by score of   37/56 on Berg Balance Scale.  (<36= high risk for falls, close to 100%; 37-45 significant >80%; 46-51 moderate >50%; 52-55 lower >25%) SpO2 monitored throughout berg on 1 L >95%. PT decreased O2 to 0L/min  WC mobility with supervision assist x 15068fith min cues for improved equal use of BUE. SpO2 >94% on no supplemental O2.   Patient returned to room and left sitting in WC, without O2, with call bell in reach, and all needs met.          Therapy Documentation Precautions:  Precautions Precautions: Other (comment) Precaution Comments: low O2 sats on  2L supplemental O2 with mobility. Restrictions Weight Bearing Restrictions: No Pain: Pain Assessment Pain Scale: 0-10 Pain Score: 0-No  pain Balance: Standardized Balance Assessment Standardized Balance Assessment: Berg Balance Test Berg Balance Test Sit to Stand: Able to stand  independently using hands Standing Unsupported: Able to stand safely 2 minutes Sitting with Back Unsupported but Feet Supported on Floor or Stool: Able to sit safely and securely 2 minutes Stand to Sit: Uses backs of legs against chair to control descent Transfers: Able to transfer with verbal cueing and /or supervision Standing Unsupported with Eyes Closed: Able to stand 10 seconds with supervision Standing Ubsupported with Feet Together: Able to place feet together independently and stand for 1 minute with supervision From Standing, Reach Forward with Outstretched Arm: Can reach forward >5 cm safely (2") From Standing Position, Pick up Object from Floor: Able to pick up shoe, needs supervision From Standing Position, Turn to Look Behind Over each Shoulder: Looks behind from both sides and weight shifts well Turn 360 Degrees: Needs close supervision or verbal cueing Standing Unsupported, Alternately Place Feet on Step/Stool: Able to stand independently and complete 8 steps >20 seconds Standing Unsupported, One Foot in Front: Able to take small step independently and hold 30 seconds Standing on One Leg: Tries to lift leg/unable to hold 3 seconds but remains standing independently Total Score: 37   See Function Navigator for Current Functional Status.   Therapy/Group: Individual Therapy  AusLorie Phenix12/2019, 11:20 AM

## 2018-01-01 NOTE — Progress Notes (Signed)
Occupational Therapy Session Note  Patient Details  Name: Tasha [name] MRN: [id number] Date of Birth: May 06, 1950  Today's Date: 01/01/2018 OT Individual Time: 1331-1430 OT Individual Time Calculation (min): 59 min    Short Term Goals: Week 1:  OT Short Term Goal 1 (Week 1): Pt will perform 3/3 steps of toileting with CGA.  OT Short Term Goal 2 (Week 1): Pt will perform sit<>stand during ADL tasks with CGA.  OT Short Term Goal 3 (Week 1): Pt will tolerate standing >5 min with VSS prior to needing seated rest break.  OT Short Term Goal 4 (Week 1): Pt will perform 3/3 steps of LB dressing with CGA.  OT Short Term Goal 5 (Week 1): Pt will demonstrate toilet transfer using LRAD and CGA.   Skilled Therapeutic Interventions/Progress Updates:    Pt presents sitting EOB, no c/o pain and agreeable to therapy session. Pt requesting to shower this session, transferred EOB>w/c>TTB in shower in pt room using RW with minA. Pt on RA at start of session with O2 sats in mid 90's, though utilized supplemental O2 during shower task (2L) to ensure pt maintaining adequate oxygenation during task. Pt completing UB/LB bathing at sit<>stand level with minA for standing balance. After seated rest break Pt transitioned to w/c stand pivot with minA, completing UB dressing with setupA, LB dressing with minA for standing balance while pt advances pants over hips, taking rest breaks throughout tasks. Pt ambulated from bathroom to EOB using RW with minA and min cues for maintaining proximity to RW. Seated EOB assisted pt with combing hair. Pt on RA at this time, O2 down to 87% after initial ambulation to EOB, increasing to 95% and above with seated rest. Pt left sitting EOB end of session with daughter completing/assisting with brushing hair, bed alarm set and call bell within reach.   Therapy Documentation Precautions:  Precautions Precautions: Other (comment) Precaution Comments: low O2 sats on  2L supplemental O2 with  mobility. Restrictions Weight Bearing Restrictions: No    ADL: ADL ADL Comments: please see functional navigator for ADL status     See Function Navigator for Current Functional Status.   Therapy/Group: Individual Therapy  Raymondo Band 01/01/2018, 7:57 AM
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
## 2018-01-02 ENCOUNTER — Inpatient Hospital Stay (HOSPITAL_COMMUNITY): Payer: Medicare Other | Admitting: Occupational Therapy

## 2018-01-02 ENCOUNTER — Inpatient Hospital Stay (HOSPITAL_COMMUNITY): Payer: Medicare Other | Admitting: Speech Pathology

## 2018-01-02 ENCOUNTER — Inpatient Hospital Stay (HOSPITAL_COMMUNITY): Payer: Medicare Other | Admitting: Physical Therapy

## 2018-01-02 DIAGNOSIS — I959 Hypotension, unspecified: Secondary | ICD-10-CM

## 2018-01-02 DIAGNOSIS — J849 Interstitial pulmonary disease, unspecified: Secondary | ICD-10-CM

## 2018-01-02 LAB — GLUCOSE, CAPILLARY
GLUCOSE-CAPILLARY: 83 mg/dL (ref 70–99)
GLUCOSE-CAPILLARY: 117 mg/dL — AB (ref 70–99)
Glucose-Capillary: 196 mg/dL — ABNORMAL HIGH (ref 70–99)
Glucose-Capillary: 124 mg/dL — ABNORMAL HIGH (ref 70–99)

## 2018-01-02 MED ORDER — PREDNISONE 20 MG PO TABS: 20.0000 mg | ORAL_TABLET | Freq: Every day | ORAL | Status: DC

## 2018-01-02 MED ORDER — PREDNISONE 20 MG PO TABS
40.0000 mg | ORAL_TABLET | Freq: Every day | ORAL | Status: AC
  Administered 2018-01-03 – 2018-01-07 (×5): 40 mg via ORAL
  Filled 2018-01-02 (×5): qty 2

## 2018-01-02 MED ORDER — PREDNISONE 10 MG PO TABS: 10.0000 mg | ORAL_TABLET | Freq: Every day | ORAL | Status: DC

## 2018-01-02 MED ORDER — PREDNISONE 20 MG PO TABS
30.0000 mg | ORAL_TABLET | Freq: Every day | ORAL | Status: DC
  Administered 2018-01-08 – 2018-01-09 (×2): 30 mg via ORAL
  Filled 2018-01-02 (×2): qty 1

## 2018-01-02 MED ORDER — PREDNISONE 10 MG PO TABS: 5.0000 mg | ORAL_TABLET | Freq: Every day | ORAL | Status: DC

## 2018-01-02 NOTE — Progress Notes (Signed)
Occupational Therapy Session Note  Patient Details  Name: Tasha [name] MRN: [id number] Date of Birth: [birth year]  Today's Date: 01/02/2018 OT Individual Time: 1300-1400 OT Individual Time Calculation (min): 60 min    Short Term Goals: Week 1:  OT Short Term Goal 1 (Week 1): Pt will perform 3/3 steps of toileting with CGA.  OT Short Term Goal 2 (Week 1): Pt will perform sit<>stand during ADL tasks with CGA.  OT Short Term Goal 3 (Week 1): Pt will tolerate standing >5 min with VSS prior to needing seated rest break.  OT Short Term Goal 4 (Week 1): Pt will perform 3/3 steps of LB dressing with CGA.  OT Short Term Goal 5 (Week 1): Pt will demonstrate toilet transfer using LRAD and CGA.   Skilled Therapeutic Interventions/Progress Updates:    Pt presents sitting up in bed, no c/o pain agreeable to OT tx session. Pt performs stand pivot transfers during session using RW with minA. Pt self-propels w/c approx 50% distance to therapy gym with therapist assisting for remaining distance due to fatigue. Seated edge of mat table pt engaged in bil UE exercise using 2lb and 1lb dowel rod, completing across multiple planes 10x each set; taking rest breaks throughout. Pt transported to day room via w/c where she engaged in standing activity of connect 4. Pt stood x2 trials, first trial standing for 7 min, second trial for 5 min while engaging in activity using single UE support and minguard for standing balance. Pt self-propels w/c 25% distance back to room with therapist assisting with remaining distance. Ambulates from doorway of room to recliner using RW with minA. Pt left seated in recliner with chair alarm set, daughter-in-law present, call bell and needs within reach.   Therapy Documentation Precautions:  Precautions Precautions: Other (comment) Precaution Comments: low O2 sats on  2L supplemental O2 with mobility. Restrictions Weight Bearing Restrictions: No   Pain: Pain Assessment Pain Scale:  0-10 Pain Score: 0-No pain ADL: ADL ADL Comments: please see functional navigator for ADL status       See Function Navigator for Current Functional Status.   Therapy/Group: Individual Therapy  Raymondo Band 01/02/2018, 3:47 PM

## 2018-01-02 NOTE — Progress Notes (Signed)
Physical Therapy Session Note  Patient Details  Name: Tasha [name] MRN: [id number] Date of Birth: 1951-03-26  Today's Date: 01/02/2018 PT Individual Time: 1002-1110 PT Individual Time Calculation (min): 68 min   Short Term Goals: Week 1:  PT Short Term Goal 1 (Week 1): Pt will perform bed mobility with supervision Assist  PT Short Term Goal 2 (Week 1): Pt will ambulate 75f with CGA and LRAD  PT Short Term Goal 3 (Week 1): WC mobility x 1074fwith CGA  PT Short Term Goal 4 (Week 1): Pt will perform stand pivot transfers with supervision assist and LRAD   Skilled Therapeutic Interventions/Progress Updates:   Pt received sitting in recliner and agreeable to PT. [facility] assist from PT and UE support RW  Gait training with RW 7022f 2; supervision assist overall from PT for safety. SpO2 measured at 87% at end of each walk and increased to >88% after 45 seconds of seated rest break.    PT instructed pt in modified [place] balance program level A with overall min assist and UE support on RW. HS curls x 10, HIp abduction x 10, mini squats x 6, calf raises x 8. Sit<>stand x 5. LAQ x 12 Pt noted to have sudden BLE knee collapse with first attempt of minisquat. Total assist to prevent fall to floor and then pt lowered to tall kneeling in RW. Mod assist to return to standing through half kneel and UE support on RW and then returned to WC.[facility] no pain in ankles or knees following incident and able to complete therapy session following prolonged rest break.  WC mobility x 140f4fth supervision assist from PT and min cues for doorway management to exit rehab gym.   Pt returned to room and performed stand pivot transfer to bed with min assist from PT and UE support on RW. Sit>supine completed with supervision assist from PT, and left supine in bed with call bell in reach and all needs met.        Therapy Documentation Precautions:  Precautions Precautions:  Other (comment) Precaution Comments: low O2 sats on  2L supplemental O2 with mobility. Restrictions Weight Bearing Restrictions: No Vital Signs: Therapy Vitals Temp: 98 F (36.7 C) Temp Source: Oral Pulse Rate: 80 Resp: 16 BP: 123/63 Patient Position (if appropriate): Sitting Oxygen Therapy SpO2: 98 % O2 Device: Room Air Pain: denies  See Function Navigator for Current Functional Status.   Therapy/Group: Individual Therapy  AustLorie Phenix3/2019, 6:01 PM

## 2018-01-02 NOTE — Progress Notes (Signed)
[facility] PHYSICAL MEDICINE & REHABILITATION     PROGRESS NOTE  Subjective/Complaints:  Patient seen sitting up at the EOB this AM.  She states she slept well overnight.  Daughter-in-law translates.  She notes she no longer requires supplemental O2.  ROS: Denies CP, SOB, nausea, vomiting, diarrhea.  Objective: Vital Signs: Blood pressure (!) 110/56, pulse 65, temperature 98 F (36.7 C), temperature source Oral, resp. rate 17, weight 67.6 kg, SpO2 93 %. No results found. Recent Labs    12/31/17 0440  WBC 8.4  HGB 9.4*  HCT 29.9*  PLT 97*   Recent Labs    12/31/17 0440  NA 141  K 3.4*  CL 105  GLUCOSE 88  BUN 26*  CREATININE 0.75  CALCIUM 8.2*   CBG (last 3)  Recent Labs    01/01/18 1654 01/01/18 2104 01/02/18 0712  GLUCAP 171* 173* 83    Wt Readings from Last 3 Encounters:  01/01/18 67.6 kg  12/30/17 66.9 kg  12/05/17 68.5 kg    Physical Exam:  BP (!) 110/56 (BP Location: Right Arm)   Pulse 65   Temp 98 F (36.7 C) (Oral)   Resp 17   Wt 67.6 kg   SpO2 93%   BMI 28.16 kg/m  Constitutional: She appearswell-developedand well-nourished.  HENT:normocephalic. Atraumatic Eyes:EOMI. No discharge. Cardiovascular:RRR. No JVD. Respiratory:Effort normal and breath sounds normal.  KV:QQVZD sounds are normal. She exhibitsno distension.  Musculoskeletal: No edema or tenderness in extremities Neurological: Dysphonia, improving  Motor: Grossly 4+/5 proximal to distal, stable Skin: Skin iswarm.  Psychiatric: She has anormal mood and affect. Herbehavior is normal.  Assessment/Plan: 1. Functional deficits secondary to debility which require 3+ hours per day of interdisciplinary therapy in a comprehensive inpatient rehab setting. Physiatrist is providing close team supervision and 24 hour management of active medical problems listed below. Physiatrist and rehab team continue to assess barriers to discharge/monitor patient progress toward functional and  medical goals.  Function:  Bathing Bathing position   Position: Shower  Bathing parts Body parts bathed by patient: Right arm, Left arm, Chest, Abdomen, Front perineal area, Buttocks, Right upper leg, Left upper leg, Right lower leg, Left lower leg, Back Body parts bathed by helper: Back  Bathing assist Assist Level: Touching or steadying assistance(Pt > 75%)      Upper Body Dressing/Undressing Upper body dressing   What is the patient wearing?: Pull over shirt/dress     Pull over shirt/dress - Perfomed by patient: Thread/unthread right sleeve, Thread/unthread left sleeve, Put head through opening, Pull shirt over trunk Pull over shirt/dress - Perfomed by helper: Pull shirt over trunk        Upper body assist Assist Level: Supervision or verbal cues      Lower Body Dressing/Undressing Lower body dressing   What is the patient wearing?: Non-skid slipper socks, Pants     Pants- Performed by patient: Thread/unthread right pants leg, Thread/unthread left pants leg, Pull pants up/down     Non-skid slipper socks- Performed by helper: Don/doff right sock, Don/doff left sock Socks - Performed by patient: Don/doff right sock, Don/doff left sock   Shoes - Performed by patient: Don/doff right shoe Shoes - Performed by helper: Don/doff left shoe, Fasten right, Fasten left          Lower body assist Assist for lower body dressing: Touching or steadying assistance (Pt > 75%)      Toileting Toileting   Toileting steps completed by patient: Adjust clothing prior to toileting, Performs perineal  hygiene, Adjust clothing after toileting   Toileting Assistive Devices: Grab bar or rail  Toileting assist Assist level: Touching or steadying assistance (Pt.75%)   Transfers Chair/bed transfer   Chair/bed transfer method: Stand pivot Chair/bed transfer assist level: Touching or steadying assistance (Pt > 75%) Chair/bed transfer assistive device: Armrests, Environmental health practitioner     Max distance: 69ft Assist level: Supervision or verbal cues   Wheelchair   Type: Manual Max wheelchair distance: 128ft Assist Level: Supervision or verbal cues  Cognition Comprehension Comprehension assist level: Follows basic conversation/direction with no assist  Expression Expression assist level: Expresses basic needs/ideas: With no assist  Social Interaction Social Interaction assist level: Interacts appropriately 90% of the time - Needs monitoring or encouragement for participation or interaction.  Problem Solving Problem solving assist level: Solves complex 90% of the time/cues < 10% of the time  Memory Memory assist level: Recognizes or recalls 90% of the time/requires cueing < 10% of the time     Medical Problem List and Plan: 1.Functional and mobility deficitssecondary to debility after multiple medical issues  Cont CIR 2. DVT Prophylaxis/Anticoagulation: Pharmaceutical:Lovenox  HIT negative 3.DJD bilateral knees/Pain Management:tylenol prn with local measures. 4. Mood:LCSW to follow for evaluation and support. 5. Neuropsych: This patientiscapable of making decisions on herown behalf. 6. Skin/Wound Care:routine pressure relief measures 7. Fluids/Electrolytes/Nutrition:Monitor I/O.   D3 nectars, advanced that his tolerated 8. RCB:ULAGTXM BP bid. On coreg bid. Prinzide on hold due to hypotension.  Remains relatively hypotensive on 9/13 9. GERD:Continue Protonix. 10. Episode of A fib with RVR: Due to stress and resolved with amiodarone. Sick euthyroid--will need TSH repeated in 3-4 weeks.Will need 30 day monitor at discharge 11. Hyperlipidemia: Lipitor on hold due to poor intake. 12. Subacute ILD: Slow steroid taper, will follow up on duration 13. Urinary retention: Improving  PVRs relatively unremarkable.  14. Steroid induced hyperglycemia on prediabetes:   Hgb A1C 6.3  Will change to Carb modified diet when patient on  regular consistencies, continue CBGs  Monitor BS ac/hs and use SSI for elevated BS.   CBGs labile on 9/13 16. Pre-renal azotemia: Encourage nectar fluid intake.  Improving, creatinine 0.75 on 9/11  Labs ordered for Monday 17. Hypokalemia  Potassium 3.4 on 9/11  Supplemented x1 day  Labs ordered for Monday 18. Hypoalbuminemia  Supplement initiated on 9/11 19. Transaminitis  ALT elevated on 9/11  Labs ordered for Monday  Continue to monitor 20. Acute blood loss anemia on ? Anemia of chronic disease  Hemoglobin 97 on 9/11  Labs ordered for Monday  Continue to monitor 21. Thrombocytopenia  Platelets 9.4 on 9/11  Labs ordered for Monday  Continue to monitor 22. Supplemental oxygen dependent  Weaned at present  LOS (Days) 3 A FACE TO FACE EVALUATION WAS PERFORMED  Ankit Lorie Phenix 01/02/2018 8:21 AM

## 2018-01-02 NOTE — Progress Notes (Signed)
Speech Language Pathology Daily Session Note  Patient Details  Name: Tasha [name] MRN: [id number] Date of Birth: Jul 14, 1950  Today's Date: 01/02/2018 SLP Individual Time: 0805-0900 SLP Individual Time Calculation (min): 55 min  Short Term Goals: Week 1: SLP Short Term Goal 1 (Week 1): Pt will consume therapeutic trials of thin liquids with mod I use of swallowing precautions and minimal overt s/s of aspiration over 3 consecutive sessions prior to repeat MBS.  SLP Short Term Goal 2 (Week 1): Pt will use memory compensatory strategies to facilitate recall of daily information with mod I.   Skilled Therapeutic Interventions:  Pt was seen for skilled ST targeting dysphagia goals.  Pt consumed therapeutic trials of thin liquids following oral care per the water protocol with no overt s/s of aspiration and mod I use of swallowing precautions.  Continue to recommend trials of water in between meals to work towards repeat MBS early next week.  Pt's voice noted to be more hoarse today in comparison to yesterday's therapy session, suspect it to be more evident today given that pt was more verbally expressive today whereas she was more soft spoken yesterday.   Pt reports talking to be difficult at times as she becomes short of breath easily.   SLP administered Respiratory Muscle Strength Testing (RMST) to assess pt's appropriateness for an exercise program using respiratory trainers for speech and swallowing.  Pt's peak Maximum Inspiratory and Maximum Expiratory Pressures (MIP and MEP respectively) were below reference levels of pt's age matched peers.  Therefore Inspiratory Muscle Strength Training and Expiratory Muscle Strength Training (IMST and EMST respectively) devices were introduced.  Pt was able to return demonstration of devices with supervision instructional cues for 50 repetitions of EMST and 25 repetitions of IMST with a self perceived effort level of 7-8 out of 10.  Provided education on indicators  of when to stop using trainer (ie if pt becomes short or breath, lightheaded or experiences pain localized in head, shoulders, chest, abdomen, or back).  All questions were answered to pt's satisfaction at this time.  Pt was left in recliner with family member at bedside.  Continue per current plan of care.     Function:  Eating Eating   Modified Consistency Diet: Yes Eating Assist Level: Swallowing techniques: self managed           Cognition Comprehension Comprehension assist level: Follows basic conversation/direction with no assist  Expression   Expression assist level: Expresses basic needs/ideas: With no assist  Social Interaction Social Interaction assist level: Interacts appropriately 90% of the time - Needs monitoring or encouragement for participation or interaction.  Problem Solving Problem solving assist level: Solves basic problems with no assist  Memory Memory assist level: Recognizes or recalls 90% of the time/requires cueing < 10% of the time    Pain Pain Assessment Pain Scale: 0-10 Pain Score: 0-No pain  Therapy/Group: Individual Therapy  Tasha [name], Tasha [name] 01/02/2018, 12:17 PM

## 2018-01-02 NOTE — Progress Notes (Signed)
[facility] PHYSICAL MEDICINE & REHABILITATION     PROGRESS NOTE  Subjective/Complaints:  Patient seen lying in bed this morning. She states she slept well overnight. She states she had a good first in therapies yesterday. Daughter-in-law at bedside.  ROS: denies CP, SOB, nausea, vomiting, diarrhea.  Objective: Vital Signs: Blood pressure (!) 110/56, pulse 65, temperature 98 F (36.7 C), temperature source Oral, resp. rate 17, weight 67.6 kg, SpO2 93 %. No results found. Recent Labs    12/31/17 0440  WBC 8.4  HGB 9.4*  HCT 29.9*  PLT 97*   Recent Labs    12/31/17 0440  NA 141  K 3.4*  CL 105  GLUCOSE 88  BUN 26*  CREATININE 0.75  CALCIUM 8.2*   CBG (last 3)  Recent Labs    01/01/18 1654 01/01/18 2104 01/02/18 0712  GLUCAP 171* 173* 83    Wt Readings from Last 3 Encounters:  01/01/18 67.6 kg  12/30/17 66.9 kg  12/05/17 68.5 kg    Physical Exam:  BP (!) 110/56 (BP Location: Right Arm)   Pulse 65   Temp 98 F (36.7 C) (Oral)   Resp 17   Wt 67.6 kg   SpO2 93%   BMI 28.16 kg/m  Constitutional: She appearswell-developedand well-nourished.  HENT:normocephalic. Atraumatic Eyes:EOMI. No discharge. Cardiovascular:RRR. No JVD. Respiratory:Effort normal and breath sounds normal. +[place]. NO:BSJGG sounds are normal. She exhibitsno distension.  Musculoskeletal: No edema or tenderness in extremities Neurological: Dysphonia  English is limited but cognitively appears intact.  Motor: Grossly 4+/5 proximal to distal stable  Skin: Skin iswarm.  Psychiatric: She has anormal mood and affect. Herbehavior is normal.  Assessment/Plan: 1. Functional deficits secondary to debility which require 3+ hours per day of interdisciplinary therapy in a comprehensive inpatient rehab setting. Physiatrist is providing close team supervision and 24 hour management of active medical problems listed below. Physiatrist and rehab team continue to assess barriers to  discharge/monitor patient progress toward functional and medical goals.  Function:  Bathing Bathing position   Position: Shower  Bathing parts Body parts bathed by patient: Right arm, Left arm, Chest, Abdomen, Front perineal area, Buttocks, Right upper leg, Left upper leg, Right lower leg, Left lower leg, Back Body parts bathed by helper: Back  Bathing assist Assist Level: Touching or steadying assistance(Pt > 75%)      Upper Body Dressing/Undressing Upper body dressing   What is the patient wearing?: Pull over shirt/dress     Pull over shirt/dress - Perfomed by patient: Thread/unthread right sleeve, Thread/unthread left sleeve, Put head through opening, Pull shirt over trunk Pull over shirt/dress - Perfomed by helper: Pull shirt over trunk        Upper body assist Assist Level: Supervision or verbal cues      Lower Body Dressing/Undressing Lower body dressing   What is the patient wearing?: Non-skid slipper socks, Pants     Pants- Performed by patient: Thread/unthread right pants leg, Thread/unthread left pants leg, Pull pants up/down     Non-skid slipper socks- Performed by helper: Don/doff right sock, Don/doff left sock Socks - Performed by patient: Don/doff right sock, Don/doff left sock   Shoes - Performed by patient: Don/doff right shoe Shoes - Performed by helper: Don/doff left shoe, Fasten right, Fasten left          Lower body assist Assist for lower body dressing: Touching or steadying assistance (Pt > 75%)      Toileting Toileting   Toileting steps completed by patient: Adjust  clothing prior to toileting, Performs perineal hygiene, Adjust clothing after toileting   Toileting Assistive Devices: Grab bar or rail  Toileting assist Assist level: Touching or steadying assistance (Pt.75%)   Transfers Chair/bed transfer   Chair/bed transfer method: Stand pivot Chair/bed transfer assist level: Touching or steadying assistance (Pt > 75%) Chair/bed transfer  assistive device: Armrests, Medical sales representative     Max distance: 4ft Assist level: Supervision or verbal cues   Wheelchair   Type: Manual Max wheelchair distance: 180ft Assist Level: Supervision or verbal cues  Cognition Comprehension Comprehension assist level: Follows basic conversation/direction with no assist  Expression Expression assist level: Expresses basic needs/ideas: With no assist  Social Interaction Social Interaction assist level: Interacts appropriately 90% of the time - Needs monitoring or encouragement for participation or interaction.  Problem Solving Problem solving assist level: Solves complex 90% of the time/cues < 10% of the time  Memory Memory assist level: Recognizes or recalls 90% of the time/requires cueing < 10% of the time     Medical Problem List and Plan: 1.Functional and mobility deficitssecondary to debility after multiple medical issues  Continue CIR 2. DVT Prophylaxis/Anticoagulation: Pharmaceutical:Lovenox  HIT negative 3.DJD bilateral knees/Pain Management:tylenol prn with local measures. 4. Mood:LCSW to follow for evaluation and support. 5. Neuropsych: This patientiscapable of making decisions on herown behalf. 6. Skin/Wound Care:routine pressure relief measures 7. Fluids/Electrolytes/Nutrition:Monitor I/O.   D3 nectars, advanced that his tolerated 8. SVX:BLTJQZE BP bid. On coreg bid. Prinzide on hold due to hypotension.  Hypotensive on 9/12 9. GERD:Continue Protonix.  10. Episode of A fib with RVR: Due to stress and resolved with amiodarone. Sick euthyroid--will need TSH repeated in 3-4 weeks.Will need 30 day monitor at discharge 11. Hyperlipidemia: Lipitor on hold due to poor intake. 12. Subacute ILD: Slow steroid taper 13. Urinary retention?:   PVRs unremarkable.  14. Steroid induced hyperglycemia on prediabetes:   Hgb A1C 6.3  Will change to Carb modified diet when patient on regular  consistencies, continue CBGs  Monitor BS ac/hs and use SSI for elevated BS.   Labile on 9/12 16. Pre-renal azotemia: Encourage nectar fluid intake.  Improving, creatinine 0.75 on 9/11 17. Hypokalemia  Potassium 3.4 on 9/11  Supplemented x1 day 18. Hypoalbuminemia  Supplement initiated on 9/11 19. Transaminitis  ALT elevated on 9/11  Continue to monitor 20. Acute blood loss anemia on ? Anemia of chronic disease  Hemoglobin 9.4 on 9/11  Continue to monitor 21. Thrombocytopenia  Platelets 9.4 on 9/11  Continue to monitor 22. Supplemental oxygen dependent  Wean as tolerated  LOS (Days) 3 A FACE TO FACE EVALUATION WAS PERFORMED  Justan Gaede Lorie Phenix 01/02/2018 8:32 AM

## 2018-01-03 ENCOUNTER — Inpatient Hospital Stay (HOSPITAL_COMMUNITY): Payer: Medicare Other | Admitting: Speech Pathology

## 2018-01-03 ENCOUNTER — Inpatient Hospital Stay (HOSPITAL_COMMUNITY): Payer: Medicare Other | Admitting: Physical Therapy

## 2018-01-03 ENCOUNTER — Inpatient Hospital Stay (HOSPITAL_COMMUNITY): Payer: Medicare Other | Admitting: Occupational Therapy

## 2018-01-03 LAB — GLUCOSE, CAPILLARY
GLUCOSE-CAPILLARY: 145 mg/dL — AB (ref 70–99)
Glucose-Capillary: 85 mg/dL (ref 70–99)
Glucose-Capillary: 88 mg/dL (ref 70–99)
Glucose-Capillary: 149 mg/dL — ABNORMAL HIGH (ref 70–99)

## 2018-01-03 NOTE — Progress Notes (Signed)
Physical Therapy Session Note  Patient Details  Name: Tasha [name] MRN: [id number] Date of Birth: July 26, 1950  Today's Date: 01/03/2018 PT Individual Time: 1420-1530 PT Individual Time Calculation (min): 70 min   Short Term Goals: Week 1:  PT Short Term Goal 1 (Week 1): Pt will perform bed mobility with supervision Assist  PT Short Term Goal 2 (Week 1): Pt will ambulate 80ft with CGA and LRAD  PT Short Term Goal 3 (Week 1): WC mobility x 195ft with CGA  PT Short Term Goal 4 (Week 1): Pt will perform stand pivot transfers with supervision assist and LRAD   Skilled Therapeutic Interventions/Progress Updates: Pt presented in bed stating some fatigue but denies pain and agreeable to therapy. Pt performed bed mobilty supine to sit with HOB elevated and supervisioon. SpO2 at rest 96% on RA. Ambulated approx 42ft with RW and verbal cues for diaphragmatic breathing. SpO2 after ambulation 90%. PTA transported remaining distance to rehab gym and participated in sitting and standing therex for BLE strengthening and endurance. Performed seated LAQ, hip flexion 1.5# 2 x 10 bilaterally. Standing hip abd/add, HS curls 1.5# wt 2 x10, alternating toe taps x 10 to 2in step. Pt also participated in standing tolerance activities including standing on airex with small challenges, marching, head turns, and using peg board on level tile. Pt maintained SpO2 88-92% on RA with recovery within 30sec when at 88%. After extended rest pt ambulated back to room with RW and x 1 standing rest break and 1 seated rest break. Pt desat to 88% after first seated rest however remained at 91% after ambulation. Pt requesting to use toilet and ambulating to toilet and performed toilet transfer with supervision. Pt returned to recliner at end of session and left with family present.      Therapy Documentation Precautions:  Precautions Precautions: Other (comment) Precaution Comments: low O2 sats on  2L supplemental O2 with  mobility. Restrictions Weight Bearing Restrictions: No General:   Vital Signs: Therapy Vitals Temp: 98.9 F (37.2 C) Temp Source: Oral Pulse Rate: 75 Resp: 19 BP: (!) 103/54 Patient Position (if appropriate): Lying Oxygen Therapy SpO2: 96 % O2 Device: Room Air Pain: Pain Assessment Pain Scale: 0-10 Pain Score: 0-No pain  See Function Navigator for Current Functional Status.   Therapy/Group: Individual Therapy  Maleko Greulich  Tiya Schrupp, PTA  01/03/2018, 4:06 PM

## 2018-01-03 NOTE — Progress Notes (Signed)
Occupational Therapy Session Note  Patient Details  Name: Tasha [name] MRN: [id number] Date of Birth: 1951-01-30  Today's Date: 01/03/2018 OT Individual Time: 7371-0626 OT Individual Time Calculation (min): 76 min   Short Term Goals: Week 1:  OT Short Term Goal 1 (Week 1): Pt will perform 3/3 steps of toileting with CGA.  OT Short Term Goal 2 (Week 1): Pt will perform sit<>stand during ADL tasks with CGA.  OT Short Term Goal 3 (Week 1): Pt will tolerate standing >5 min with VSS prior to needing seated rest break.  OT Short Term Goal 4 (Week 1): Pt will perform 3/3 steps of LB dressing with CGA.  OT Short Term Goal 5 (Week 1): Pt will demonstrate toilet transfer using LRAD and CGA.   Skilled Therapeutic Interventions/Progress Updates:    Pt greeted EOB with dtr and interpretor present. Wanting to shower. Tx focus on energy conservation/work simplification, balance, functional transfers, and activity tolerance during bathing, dressing, and grooming tasks. All functional transfers completed at ambulatory level using RW with steady assist. She required steady assist for BADLs at shower level with cues for PLB. At initial greeting, pts 02 sats on RA 96%. However post shower, 02 sats dropped to 84% with visible SOB. Applied 1L 02 via nasal canula and sats increased to 91-92% with deep breathing. Then we proceeded to dress sit<stand from TTB with cues to take rest breaks. Oral care/grooming tasks completed w/c level with pt/dtr education regarding energy conservation strategies (I.e. Balancing sitting vs standing during routine tasks). Mod A for blow-drying hair. At end of session pt remained up in w/c with dtr and RN present. 02 sats 96% on RA.   Covered both chest tub sites prior to shower (Lt upper chest + Rt flank)  Therapy Documentation Precautions:  Precautions Precautions: Other (comment) Precaution Comments: low O2 sats on  2L supplemental O2 with mobility. Restrictions Weight Bearing  Restrictions: No Pain: Under Rt breast. RN made aware   ADL: ADL ADL Comments: please see functional navigator for ADL status      See Function Navigator for Current Functional Status.   Therapy/Group: Individual Therapy  Miklo Aken A Krisha Beegle 01/03/2018, 12:22 PM

## 2018-01-03 NOTE — Progress Notes (Addendum)
Speech Language Pathology Daily Session Note  Patient Details  Name: Tasha [name] MRN: [id number] Date of Birth: 1950/07/13  Today's Date: 01/03/2018 SLP Individual Time: 1115-1200 SLP Individual Time Calculation (min): 45 min  Short Term Goals: Week 1: SLP Short Term Goal 1 (Week 1): Pt will consume therapeutic trials of thin liquids with mod I use of swallowing precautions and minimal overt s/s of aspiration over 3 consecutive sessions prior to repeat MBS.  SLP Short Term Goal 2 (Week 1): Pt will use memory compensatory strategies to facilitate recall of daily information with mod I.  SLP Short Term Goal 3 (Week 1): Pt will complete x50 repetitions each of IMST and EMST with mod I use of device and self reported effort leve of <7/10.    Skilled Therapeutic Interventions:  Pt was seen for skilled ST targeting goals for cognition and dysphagia.  Pt was able to complete 25 repetitions each of IMST and EMST with mod I for use of device and a self perceived effort level of 7/10.  SLP administered the Mo[employer] (Spanish) standardized cognitive assessment as part of ongoing diagnostic treatment of cognition.  A formal score was unable to be obtained due to modifications made to allow for pt's baseline abilities and dialectal variations; however, deficits were most notable for memory, specifically working memory and delayed recall.  Therefore, current cognitive goals remain appropriate and will focus efforts on education and carryover of memory compensatory strategies.  Discussed with pt and daughter who were in agreement with plan of care.  Pt was left in recliner with call bell within reach and family at bedside.  Continue per current plan of care.    Function:  Eating Eating   Modified Consistency Diet: Yes Eating Assist Level: Swallowing techniques: self managed   Eating Set Up Assist For: Opening containers       Cognition Comprehension Comprehension assist level: Follows basic  conversation/direction with no assist  Expression   Expression assist level: Expresses basic needs/ideas: With no assist  Social Interaction Social Interaction assist level: Interacts appropriately 90% of the time - Needs monitoring or encouragement for participation or interaction.  Problem Solving Problem solving assist level: Solves basic 90% of the time/requires cueing < 10% of the time  Memory Memory assist level: Recognizes or recalls 90% of the time/requires cueing < 10% of the time    Pain Pain Assessment Pain Scale: 0-10 Pain Score: 0-No pain  Therapy/Group: Individual Therapy  Issiac Jamar, Selinda Orion 01/03/2018, 4:01 PM

## 2018-01-03 NOTE — Progress Notes (Signed)
[facility] PHYSICAL MEDICINE & REHABILITATION     PROGRESS NOTE  Subjective/Complaints:  Patient seen lying in bed this morning. She states she slept well overnight. Daughter-in-law at bedside translates. She has questions about steroids.  ROS: Denies CP, SOB, nausea, vomiting, diarrhea.  Objective: Vital Signs: Blood pressure 129/66, pulse 73, temperature 98.1 F (36.7 C), temperature source Oral, resp. rate 18, weight 67.6 kg, SpO2 90 %. No results found. No results for input(s): WBC, HGB, HCT, PLT in the last 72 hours. No results for input(s): NA, K, CL, GLUCOSE, BUN, CREATININE, CALCIUM in the last 72 hours.  Invalid input(s): CO CBG (last 3)  Recent Labs    01/02/18 2104 01/03/18 0645 01/03/18 1124  GLUCAP 124* 85 88    Wt Readings from Last 3 Encounters:  01/01/18 67.6 kg  12/30/17 66.9 kg  12/05/17 68.5 kg    Physical Exam:  BP 129/66 (BP Location: Right Arm)   Pulse 73   Temp 98.1 F (36.7 C) (Oral)   Resp 18   Wt 67.6 kg   SpO2 90%   BMI 28.16 kg/m  Constitutional: She appearswell-developedand well-nourished.  HENT:normocephalic. Atraumatic Eyes:EOMI. No discharge. Cardiovascular:RRR. No JVD. Respiratory:Effort normal and breath sounds normal.  EU:MPNTI sounds are normal. She exhibitsno distension.  Musculoskeletal: No edema or tenderness in extremities Neurological: Dysphonia, improving  Motor: Grossly 4+/5 proximal to distal, unchanged Skin: Skin iswarm.  Psychiatric: She has anormal mood and affect. Herbehavior is normal.  Assessment/Plan: 1. Functional deficits secondary to debility which require 3+ hours per day of interdisciplinary therapy in a comprehensive inpatient rehab setting. Physiatrist is providing close team supervision and 24 hour management of active medical problems listed below. Physiatrist and rehab team continue to assess barriers to discharge/monitor patient progress toward functional and medical  goals.  Function:  Bathing Bathing position   Position: Shower  Bathing parts Body parts bathed by patient: Right arm, Left arm, Chest, Abdomen, Front perineal area, Buttocks, Right upper leg, Left upper leg, Right lower leg, Left lower leg, Back Body parts bathed by helper: Back  Bathing assist Assist Level: Touching or steadying assistance(Pt > 75%)      Upper Body Dressing/Undressing Upper body dressing   What is the patient wearing?: Pull over shirt/dress     Pull over shirt/dress - Perfomed by patient: Thread/unthread right sleeve, Thread/unthread left sleeve, Put head through opening, Pull shirt over trunk Pull over shirt/dress - Perfomed by helper: Pull shirt over trunk        Upper body assist Assist Level: Supervision or verbal cues      Lower Body Dressing/Undressing Lower body dressing   What is the patient wearing?: Non-skid slipper socks, Pants     Pants- Performed by patient: Thread/unthread right pants leg, Thread/unthread left pants leg, Pull pants up/down     Non-skid slipper socks- Performed by helper: Don/doff right sock, Don/doff left sock Socks - Performed by patient: Don/doff right sock, Don/doff left sock   Shoes - Performed by patient: Don/doff right shoe Shoes - Performed by helper: Don/doff left shoe, Fasten right, Fasten left          Lower body assist Assist for lower body dressing: Touching or steadying assistance (Pt > 75%)      Toileting Toileting   Toileting steps completed by patient: Adjust clothing prior to toileting, Performs perineal hygiene, Adjust clothing after toileting   Toileting Assistive Devices: Grab bar or rail  Toileting assist Assist level: Supervision or verbal cues   Transfers  Chair/bed transfer   Chair/bed transfer method: Stand pivot Chair/bed transfer assist level: Touching or steadying assistance (Pt > 75%) Chair/bed transfer assistive device: Armrests, Medical sales representative     Max  distance: 79ft Assist level: Supervision or verbal cues   Wheelchair   Type: Manual Max wheelchair distance: 177ft Assist Level: Supervision or verbal cues  Cognition Comprehension Comprehension assist level: Follows basic conversation/direction with no assist  Expression Expression assist level: Expresses basic needs/ideas: With no assist  Social Interaction Social Interaction assist level: Interacts appropriately 90% of the time - Needs monitoring or encouragement for participation or interaction.  Problem Solving Problem solving assist level: Solves basic problems with no assist  Memory Memory assist level: Recognizes or recalls 90% of the time/requires cueing < 10% of the time     Medical Problem List and Plan: 1.Functional and mobility deficitssecondary to debility after multiple medical issues  Cont CIR 2. DVT Prophylaxis/Anticoagulation: Pharmaceutical:Lovenox  HIT negative 3.DJD bilateral knees/Pain Management:tylenol prn with local measures. 4. Mood:LCSW to follow for evaluation and support. 5. Neuropsych: This patientiscapable of making decisions on herown behalf. 6. Skin/Wound Care:routine pressure relief measures 7. Fluids/Electrolytes/Nutrition:Monitor I/O.   D3 nectars, advanced that his tolerated 8. ZOX:WRUEAVW BP bid. On coreg bid. Prinzide on hold due to hypotension.  Controlled on 9/14 9. GERD:Continue Protonix. 10. Episode of A fib with RVR: Due to stress and resolved with amiodarone. Sick euthyroid--will need TSH repeated in 3-4 weeks.Will need 30 day monitor at discharge 11. Hyperlipidemia: Lipitor on hold due to poor intake. 12. Subacute ILD: Slow steroid taper initiated on 9/14 13. Urinary retention: Improving  PVRs relatively unremarkable.  14. Steroid induced hyperglycemia on prediabetes:   Hgb A1C 6.3  Will change to Carb modified diet when patient on regular consistencies, continue CBGs  Monitor BS ac/hs and use SSI for elevated  BS.   Appears to be improving on 9/14 16. Pre-renal azotemia: Encourage nectar fluid intake.  Improving, creatinine 0.75 on 9/11  Labs ordered for Monday 17. Hypokalemia  Potassium 3.4 on 9/11  Supplemented x1 day  Labs ordered for Monday 18. Hypoalbuminemia  Supplement initiated on 9/11 19. Transaminitis  ALT elevated on 9/11  Labs ordered for Monday  Continue to monitor 20. Acute blood loss anemia on ? Anemia of chronic disease  Hemoglobin 97 on 9/11  Labs ordered for Monday  Continue to monitor 21. Thrombocytopenia  Platelets 9.4 on 9/11  Labs ordered for Monday  Continue to monitor 22. Supplemental oxygen dependent  Weaned at present  LOS (Days) 4 A FACE TO FACE EVALUATION WAS PERFORMED  Tasha [name] Lorie Phenix 01/03/2018 12:44 PM

## 2018-01-03 NOTE — Progress Notes (Signed)
Social Work [name], female   DOB: 1950-08-31, 67 y.o.   MRN: [id number]   CSW met with pt and her dtr-in-law 01-01-18 with Spanish Interpreter present to update them on team conference discussion and targeted d/c date of 01-13-18.  Pt was pleased with this, as was family.  She is motivated to work hard and to get better.  CSW will continue to follow and assist as needed.

## 2018-01-03 NOTE — Patient Care Conference (Signed)
Inpatient RehabilitationTeam Conference and Plan of Care Update Date: 12/31/2017   Time: 2:50 PM   Patient Name: Tasha [name]      Medical Record Number: [id number]  Date of Birth: [birth year] Sex: Female         Room/Bed: 4M03C/4M03C-01 Payor Info: Payor: MEDICARE / Plan: MEDICARE PART A AND B / Product Type: *No Product type* /    Admitting Diagnosis: Debility  Admit Date/Time:  12/30/2017  3:34 PM Admission Comments: No comment available   Primary Diagnosis:  <principal problem not specified> Principal Problem: <principal problem not specified>  Patient Active Problem List   Diagnosis Date Noted  . Interstitial lung disease (HCC)   . Hypotension   . Dysphagia   . Thrombocytopenia (HCC)   . Transaminitis   . Hypokalemia   . Prediabetes   . Urinary retention   . History of atrial fibrillation   . Debility 12/30/2017  . Persistent atrial fibrillation (HCC)   . Prerenal azotemia   . Supplemental oxygen dependent   . Metabolic syndrome   . Tachypnea   . Steroid-induced hyperglycemia   . Hypoalbuminemia due to protein-calorie malnutrition (HCC)   . Acute blood loss anemia   . ARDS (adult respiratory distress syndrome) (HCC)   . Barotrauma   . Chest tube in place   . On mechanically assisted ventilation (HCC)   . Atrial fibrillation with rapid ventricular response (HCC)   . Pneumothorax   . Difficult intravenous access   . Encounter for central line placement   . BOOP (bronchiolitis obliterans with organizing pneumonia) (HCC)   . Acute respiratory failure (HCC)   . CAP (community acquired pneumonia)   . Depression 03/12/2016  . BMI 25.0-25.9,adult 02/20/2016  . Allergy-induced asthma 08/10/2014  . Gastroesophageal reflux disease without esophagitis 08/10/2014  . Hyperlipidemia with target LDL less than 100 05/04/2013  . Hypertension 09/03/2012    Expected Discharge Date: Expected Discharge Date: 01/13/18  Team Members Present: Physician leading conference: Dr.  Maryla Morrow Social Worker Present: Staci Acosta, LCSW Nurse Present: Chana Bode, RN PT Present: Harless Litten, PTA;Grier Rocher, PT OT Present: Roney Mans, OT SLP Present: Feliberto Gottron, SLP PPS Coordinator present : Edson Snowball, PT     Current Status/Progress Goal Weekly Team Focus  Medical   Functional and mobility deficits secondary to debility after multiple medical issues  Improve mobility, safety, BP, urinary retention, IDL, prediabetes, electrolytes, supplmental oxygen  See above   Bowel/Bladder   Continent of bladder and bowel on stool softener and prn laxative . Last BM 9/7.2019   Maintain normal bowel/bladder pattern  Assess,QS and prn    Swallow/Nutrition/ Hydration   Eval Pending         ADL's   eval pending         Mobility   CGA bed mobility. Min assist stand pivot transfers with. min assist ambulation with   Mod I trasnfers with LRAD. Supervision assist ambulation at house hold level   endurance, transfers, improved independence with gait training.    Communication   Eval Pending          Safety/Cognition/ Behavioral Observations  Eval Pending          Pain   No complaint -pain    ,< 3  Assess, medicate, evaluate, educate patient and famly, QS ad prn   Skin   Skin intact, prior wounds healing well, s/p chest tubes, o drainage dressing intact  Remaiin free of skin breakdown and infection  Assess QS and  prn     Rehab Goals Patient on target to meet rehab goals: Yes Rehab Goals Revised: none - pt's evaluation day on day of conference *See Care Plan and progress notes for long and short-term goals.     Barriers to Discharge  Current Status/Progress Possible Resolutions Date Resolved   Physician    Medical stability;New oxygen     See above  Therapies, slow steroid taper, follow labs, follow PVRs      Nursing                  PT  Inaccessible home environment;Home environment access/layout;New oxygen                 OT                  SLP                 SW                Discharge Planning/Teaching Needs:  Pt plans to go home with her husband and other family members to assist her.  Formal family education closer to d/c.   Team Discussion:  Pt with dysplagia and lung disease.  Having urinary retention.  Pt with steroid induced diabetes, AKI, low potassium, low platelets, and supplemental O2.  ST to eval as pt is on a D3, nectar thick liquids diet.  Pt is overall min A with PT, walking 64' with RW with some desats into mid 80s, but comes back to mid/upper 90s with breaks.  Pt can do two stairs with min/mod A, min A for car txs, contact guard for be mobility.  Pt with mod I tx goals and S ambulation goals.  Revisions to Treatment Plan:  none    Continued Need for Acute Rehabilitation Level of Care: The patient requires daily medical management by a physician with specialized training in physical medicine and rehabilitation for the following conditions: Daily direction of a multidisciplinary physical rehabilitation program to ensure safe treatment while eliciting the highest outcome that is of practical value to the patient.: Yes Daily medical management of patient stability for increased activity during participation in an intensive rehabilitation regime.: Yes Daily analysis of laboratory values and/or radiology reports with any subsequent need for medication adjustment of medical intervention for : Pulmonary problems;Diabetes problems;Blood pressure problems   I attest that I was present, lead the team conference, and concur with the assessment and plan of the team.   Miyonna Ormiston, [place] 01/03/2018, 1:27 AM

## 2018-01-04 ENCOUNTER — Inpatient Hospital Stay (HOSPITAL_COMMUNITY): Payer: Medicare Other | Admitting: Occupational Therapy

## 2018-01-04 DIAGNOSIS — D62 Acute posthemorrhagic anemia: Secondary | ICD-10-CM

## 2018-01-04 LAB — GLUCOSE, CAPILLARY
GLUCOSE-CAPILLARY: 112 mg/dL — AB (ref 70–99)
Glucose-Capillary: 86 mg/dL (ref 70–99)
Glucose-Capillary: 203 mg/dL — ABNORMAL HIGH (ref 70–99)
Glucose-Capillary: 136 mg/dL — ABNORMAL HIGH (ref 70–99)

## 2018-01-04 NOTE — Progress Notes (Signed)
Patient c/o nose bleeds but RN did not see any ; complains of tiredness vital signs taken and recorded but not significant patient had just finished her therapy. Patient went to join the singing therapy and per son she did well with no complaints. Advised patient to call RN when she will have another nosebleed episodes.Continued to monitor.

## 2018-01-04 NOTE — Progress Notes (Signed)
Occupational Therapy Session Note  Patient Details  Name: Tasha [name] MRN: [id number] Date of Birth: 1950-05-27  Today's Date: 01/04/2018 OT Individual Time: 0850-1002 OT Individual Time Calculation (min): 72 min   Short Term Goals: Week 1:  OT Short Term Goal 1 (Week 1): Pt will perform 3/3 steps of toileting with CGA.  OT Short Term Goal 2 (Week 1): Pt will perform sit<>stand during ADL tasks with CGA.  OT Short Term Goal 3 (Week 1): Pt will tolerate standing >5 min with VSS prior to needing seated rest break.  OT Short Term Goal 4 (Week 1): Pt will perform 3/3 steps of LB dressing with CGA.  OT Short Term Goal 5 (Week 1): Pt will demonstrate toilet transfer using LRAD and CGA.   Skilled Therapeutic Interventions/Progress Updates:    Pt greeted in recliner with no c/o pain. Son present. Tx focus on standing endurance, energy conservation/work simplification, and functional transfers during self care and IADL participation. She opted to skip B/D this AM, instead wanting only to use the restroom and complete oral care, which she did at ambulatory level with supervision assist using RW. Post ambulation 02 sats on RA 88%, increasing to 91% after 30 seconds with cues for deep belly breathing. She then ambulated 150 ft with RW and supervision towards dayroom! Once again, continued education regarding using primary vs accessory breathing muscles. She also required cues for staying inside of walker. Pt then engaged in meaningful crochet activity standing at elevated table. Longest standing window without rest 8 minutes. Pt mindful of her breathing while standing and took several seated rest breaks. 02 sats dipped to 85-88% post stands, however pt able to increase to 91-93% via deep belly breathing with hands on stomach to gauge belly rise. Discussed with son importance of implementing energy conservation techniques at home during ADLs/IADLs for gradual endurance building. Pt crocheted herself [name] bracelet  during tx(!) and appeared to experience flow while making it. When she was taken back to room, she completed [name] second toilet transfer at ambulatory level. Supervision for transfer and hygiene completion post BM. After handwashing, she transferred to recliner and was left with all needs within reach.    For water protocol, she took 7 sips of water during session with cues for small sips and chin tuck. No coughing observed.   Interpretor present during session   Therapy Documentation Precautions:  Precautions Precautions: Other (comment) Precaution Comments: low O2 sats on  2L supplemental O2 with mobility. Restrictions Weight Bearing Restrictions: No Vital Signs: Therapy Vitals Temp: 97.8 F (36.6 C) Temp Source: Oral Pulse Rate: 79 BP: 122/68 Patient Position (if appropriate): Lying Oxygen Therapy SpO2: 93 % O2 Device: Room Air Pain: No c/o pain during tx   ADL: ADL ADL Comments: please see functional navigator for ADL status     See Function Navigator for Current Functional Status.   Therapy/Group: Individual Therapy  Tasha [name] [name] 01/04/2018, 12:45 PM

## 2018-01-04 NOTE — Progress Notes (Signed)
[place] PHYSICAL MEDICINE & REHABILITATION     PROGRESS NOTE  Subjective/Complaints:  Patient seen lying in bed this AM.  Son at bedside.  Patient states she is done well overnight.  ROS: Denies CP, SOB, nausea, vomiting, diarrhea.  Objective: Vital Signs: Blood pressure 128/64, pulse 74, temperature 98.6 F (37 C), temperature source Oral, resp. rate 17, weight 68 kg, SpO2 98 %. No results found. No results for input(s): WBC, HGB, HCT, PLT in the last 72 hours. No results for input(s): NA, K, CL, GLUCOSE, BUN, CREATININE, CALCIUM in the last 72 hours.  Invalid input(s): CO CBG (last 3)  Recent Labs    01/03/18 2125 01/04/18 0626 01/04/18 1224  GLUCAP 145* 86 112*    Wt Readings from Last 3 Encounters:  01/04/18 68 kg  12/30/17 66.9 kg  12/05/17 68.5 kg    Physical Exam:  BP 128/64 (BP Location: Right Arm)   Pulse 74   Temp 98.6 F (37 C) (Oral)   Resp 17   Wt 68 kg   SpO2 98%   BMI 28.33 kg/m  Constitutional: She appearswell-developedand well-nourished.  HENT:normocephalic. Atraumatic Eyes:EOMI. No discharge. Cardiovascular:RRR.  No JVD. Respiratory:Effort normal and breath sounds normal.  TK:PTWSF sounds are normal. She exhibitsno distension.  Musculoskeletal: No edema or tenderness in extremities Neurological: Dysphonia, improving  Motor: Grossly 4+/5 proximal to distal Skin: Skin iswarm.  Psychiatric: She has anormal mood and affect. Herbehavior is normal.  Assessment/Plan: 1. Functional deficits secondary to debility which require 3+ hours per day of interdisciplinary therapy in a comprehensive inpatient rehab setting. Physiatrist is providing close team supervision and 24 hour management of active medical problems listed below. Physiatrist and rehab team continue to assess barriers to discharge/monitor patient progress toward functional and medical goals.  Function:  Bathing Bathing position   Position: Shower  Bathing parts Body  parts bathed by patient: Right arm, Left arm, Chest, Abdomen, Front perineal area, Buttocks, Right upper leg, Left upper leg, Right lower leg, Left lower leg, Back Body parts bathed by helper: Back  Bathing assist Assist Level: Touching or steadying assistance(Pt > 75%)      Upper Body Dressing/Undressing Upper body dressing   What is the patient wearing?: Pull over shirt/dress     Pull over shirt/dress - Perfomed by patient: Thread/unthread right sleeve, Thread/unthread left sleeve, Put head through opening, Pull shirt over trunk Pull over shirt/dress - Perfomed by helper: Pull shirt over trunk        Upper body assist Assist Level: Supervision or verbal cues      Lower Body Dressing/Undressing Lower body dressing   What is the patient wearing?: Non-skid slipper socks, Pants     Pants- Performed by patient: Thread/unthread right pants leg, Thread/unthread left pants leg, Pull pants up/down     Non-skid slipper socks- Performed by helper: Don/doff right sock, Don/doff left sock Socks - Performed by patient: Don/doff right sock, Don/doff left sock   Shoes - Performed by patient: Don/doff right shoe Shoes - Performed by helper: Don/doff left shoe, Fasten right, Fasten left          Lower body assist Assist for lower body dressing: Touching or steadying assistance (Pt > 75%)      Toileting Toileting   Toileting steps completed by patient: Adjust clothing prior to toileting, Performs perineal hygiene, Adjust clothing after toileting   Toileting Assistive Devices: Grab bar or rail  Toileting assist Assist level: Supervision or verbal cues   Transfers Chair/bed transfer  Chair/bed transfer method: Stand pivot Chair/bed transfer assist level: Touching or steadying assistance (Pt > 75%) Chair/bed transfer assistive device: Armrests, Medical sales representative     Max distance: 68ft Assist level: Supervision or verbal cues   Wheelchair   Type: Manual Max  wheelchair distance: 177ft Assist Level: Supervision or verbal cues  Cognition Comprehension Comprehension assist level: Follows basic conversation/direction with no assist(with interpreter or family members)  Expression Expression assist level: Expresses basic 90% of the time/requires cueing < 10% of the time.  Social Interaction Social Interaction assist level: Interacts appropriately 90% of the time - Needs monitoring or encouragement for participation or interaction.  Problem Solving Problem solving assist level: Solves basic problems with no assist  Memory Memory assist level: Recognizes or recalls 90% of the time/requires cueing < 10% of the time     Medical Problem List and Plan: 1.Functional and mobility deficitssecondary to debility after multiple medical issues  Cont CIR 2. DVT Prophylaxis/Anticoagulation: Pharmaceutical:Lovenox  HIT negative 3.DJD bilateral knees/Pain Management:tylenol prn with local measures. 4. Mood:LCSW to follow for evaluation and support. 5. Neuropsych: This patientiscapable of making decisions on herown behalf. 6. Skin/Wound Care:routine pressure relief measures 7. Fluids/Electrolytes/Nutrition:Monitor I/O.   D3 nectars, advanced that his tolerated 8. MGQ:QPYPPJK BP bid. On coreg bid. Prinzide on hold due to hypotension.  Controlled on 9/14 9. GERD:Continue Protonix. 10. Episode of A fib with RVR: Due to stress and resolved with amiodarone. Sick euthyroid--will need TSH repeated in 3-4 weeks.Will need 30 day monitor at discharge 11. Hyperlipidemia: Lipitor on hold due to poor intake. 12. Subacute ILD: Slow steroid taper initiated on 9/14 13. Urinary retention: Improving  PVRs relatively unremarkable.  14. Steroid induced hyperglycemia on prediabetes:   Hgb A1C 6.3  Will change to Carb modified diet when patient on regular consistencies, continue CBGs  Monitor BS ac/hs and use SSI for elevated BS.   Slightly labile on 9/15 16.  Pre-renal azotemia: Encourage nectar fluid intake.  Improving, creatinine 0.75 on 9/11  Labs ordered for tomorrow 17. Hypokalemia  Potassium 3.4 on 9/11  Supplemented x1 day  Labs ordered for tomorrow 18. Hypoalbuminemia  Supplement initiated on 9/11 19. Transaminitis  ALT elevated on 9/11  Labs ordered for tomorrow  Continue to monitor 20. Acute blood loss anemia on ? Anemia of chronic disease  Hemoglobin 9.7 on 9/11  Labs ordered for tomorrow  Continue to monitor 21. Thrombocytopenia  Platelets 9.4 on 9/11  Labs ordered for tomorrow  Continue to monitor 22. Supplemental oxygen dependent  Weaned at present  LOS (Days) 5 A FACE TO FACE EVALUATION WAS PERFORMED  Neftali Thurow Lorie Phenix 01/04/2018 2:27 PM

## 2018-01-05 ENCOUNTER — Inpatient Hospital Stay (HOSPITAL_COMMUNITY): Payer: Medicare Other | Admitting: Physical Therapy

## 2018-01-05 ENCOUNTER — Inpatient Hospital Stay (HOSPITAL_COMMUNITY): Payer: Medicare Other | Admitting: Speech Pathology

## 2018-01-05 ENCOUNTER — Inpatient Hospital Stay (HOSPITAL_COMMUNITY): Payer: Medicare Other | Admitting: Occupational Therapy

## 2018-01-05 LAB — BASIC METABOLIC PANEL
Anion gap: 6 (ref 5–15)
Anion gap: 6 (ref 5–15)
BUN: 23 mg/dL (ref 8–23)
BUN: 26 mg/dL — AB (ref 8–23)
CALCIUM: 8 mg/dL — AB (ref 8.9–10.3)
CALCIUM: 8.2 mg/dL — AB (ref 8.9–10.3)
CHLORIDE: 112 mmol/L — AB (ref 98–111)
CO2: 26 mmol/L (ref 22–32)
CO2: 25 mmol/L (ref 22–32)
CREATININE: 0.94 mg/dL (ref 0.44–1.00)
CREATININE: 0.92 mg/dL (ref 0.44–1.00)
Chloride: 112 mmol/L — ABNORMAL HIGH (ref 98–111)
GFR calc non Af Amer: >60 mL/min (ref >60)
Glucose, Bld: 131 mg/dL — ABNORMAL HIGH (ref 70–99)
Glucose, Bld: 143 mg/dL — ABNORMAL HIGH (ref 70–99)
Potassium: 2.7 mmol/L — CL (ref 3.5–5.1)
Potassium: 4 mmol/L (ref 3.5–5.1)
SODIUM: 144 mmol/L (ref 135–145)
SODIUM: 143 mmol/L (ref 135–145)

## 2018-01-05 LAB — GLUCOSE, CAPILLARY
GLUCOSE-CAPILLARY: 81 mg/dL (ref 70–99)
GLUCOSE-CAPILLARY: 151 mg/dL — AB (ref 70–99)
Glucose-Capillary: 75 mg/dL (ref 70–99)
Glucose-Capillary: 118 mg/dL — ABNORMAL HIGH (ref 70–99)

## 2018-01-05 LAB — COMPREHENSIVE METABOLIC PANEL
ALBUMIN: 2.1 g/dL — AB (ref 3.5–5.0)
ALK PHOS: 33 U/L — AB (ref 38–126)
ALT: 74 U/L — ABNORMAL HIGH (ref 0–44)
ANION GAP: 6 (ref 5–15)
AST: 32 U/L (ref 15–41)
BUN: 23 mg/dL (ref 8–23)
CO2: 25 mmol/L (ref 22–32)
Calcium: 7.9 mg/dL — ABNORMAL LOW (ref 8.9–10.3)
Chloride: 113 mmol/L — ABNORMAL HIGH (ref 98–111)
Creatinine, Ser: 0.85 mg/dL (ref 0.44–1.00)
GFR calc non Af Amer: >60 mL/min (ref >60)
Glucose, Bld: 83 mg/dL (ref 70–99)
Potassium: 2.7 mmol/L — CL (ref 3.5–5.1)
Sodium: 144 mmol/L (ref 135–145)
Total Bilirubin: 0.5 mg/dL (ref 0.3–1.2)
Total Protein: 4.1 g/dL — ABNORMAL LOW (ref 6.5–8.1)

## 2018-01-05 LAB — CBC
HEMATOCRIT: 25.8 % — AB (ref 36.0–46.0)
Hemoglobin: 8.4 g/dL — ABNORMAL LOW (ref 12.0–15.0)
MCH: 28.7 pg (ref 26.0–34.0)
MCHC: 32.6 g/dL (ref 30.0–36.0)
MCV: 88.1 fL (ref 78.0–100.0)
Platelets: 86 10*3/uL — ABNORMAL LOW (ref 150–400)
RBC: 2.93 MIL/uL — ABNORMAL LOW (ref 3.87–5.11)
RDW: 16 % — ABNORMAL HIGH (ref 11.5–15.5)
WBC: 8 10*3/uL (ref 4.0–10.5)

## 2018-01-05 MED ORDER — POTASSIUM CHLORIDE CRYS ER 20 MEQ PO TBCR
20.0000 meq | EXTENDED_RELEASE_TABLET | Freq: Two times a day (BID) | ORAL | Status: DC
  Administered 2018-01-05 – 2018-01-09 (×9): 20 meq via ORAL
  Filled 2018-01-05 (×10): qty 1

## 2018-01-05 MED ORDER — POTASSIUM CHLORIDE CRYS ER 20 MEQ PO TBCR
40.0000 meq | EXTENDED_RELEASE_TABLET | Freq: Once | ORAL | Status: AC
  Administered 2018-01-05: 40 meq via ORAL

## 2018-01-05 MED ORDER — POTASSIUM CHLORIDE 10 MEQ/100ML IV SOLN
10.0000 meq | INTRAVENOUS | Status: AC
  Administered 2018-01-05 (×3): 10 meq via INTRAVENOUS
  Filled 2018-01-05 (×7): qty 100

## 2018-01-05 NOTE — Progress Notes (Addendum)
[place] PHYSICAL MEDICINE & REHABILITATION     PROGRESS NOTE  Subjective/Complaints:  Patient seen sitting up at the edge of her bed this morning. She states she slept well overnight. Daughter bedside translates. She notes nonproductive cough.  ROS: + cough. Denies CP, SOB, nausea, vomiting, diarrhea.  Objective: Vital Signs: Blood pressure (!) 121/56, pulse 69, temperature 98.5 F (36.9 C), temperature source Oral, resp. rate 18, weight 68.2 kg, SpO2 94 %. No results found. No results for input(s): WBC, HGB, HCT, PLT in the last 72 hours. No results for input(s): NA, K, CL, GLUCOSE, BUN, CREATININE, CALCIUM in the last 72 hours.  Invalid input(s): CO CBG (last 3)  Recent Labs    01/04/18 1642 01/04/18 2114 01/05/18 0635  GLUCAP 203* 136* 75    Wt Readings from Last 3 Encounters:  01/05/18 68.2 kg  12/30/17 66.9 kg  12/05/17 68.5 kg    Physical Exam:  BP (!) 121/56 (BP Location: Right Arm)   Pulse 69   Temp 98.5 F (36.9 C) (Oral)   Resp 18   Wt 68.2 kg   SpO2 94%   BMI 28.41 kg/m  Constitutional: She appearswell-developedand well-nourished.  HENT:normocephalic. Atraumatic Eyes:EOMI. No discharge. Cardiovascular: RRR. No JVD. Respiratory:Effort normal and breath sounds normal.  OE:UMPNT sounds are normal. She exhibitsno distension.  Musculoskeletal: No edema or tenderness in extremities Neurological: Dysphonia, improving  Motor: Grossly 4+/5 proximal to distal, stable Skin: Skin iswarm.  Psychiatric: She has anormal mood and affect. Herbehavior is normal.  Assessment/Plan: 1. Functional deficits secondary to debility which require 3+ hours per day of interdisciplinary therapy in a comprehensive inpatient rehab setting. Physiatrist is providing close team supervision and 24 hour management of active medical problems listed below. Physiatrist and rehab team continue to assess barriers to discharge/monitor patient progress toward functional and  medical goals.  Function:  Bathing Bathing position   Position: Shower  Bathing parts Body parts bathed by patient: Right arm, Left arm, Chest, Abdomen, Front perineal area, Buttocks, Right upper leg, Left upper leg, Right lower leg, Left lower leg, Back Body parts bathed by helper: Back  Bathing assist Assist Level: Touching or steadying assistance(Pt > 75%)      Upper Body Dressing/Undressing Upper body dressing   What is the patient wearing?: Pull over shirt/dress     Pull over shirt/dress - Perfomed by patient: Thread/unthread right sleeve, Thread/unthread left sleeve, Put head through opening, Pull shirt over trunk Pull over shirt/dress - Perfomed by helper: Pull shirt over trunk        Upper body assist Assist Level: Supervision or verbal cues      Lower Body Dressing/Undressing Lower body dressing   What is the patient wearing?: Non-skid slipper socks, Pants     Pants- Performed by patient: Thread/unthread right pants leg, Thread/unthread left pants leg, Pull pants up/down     Non-skid slipper socks- Performed by helper: Don/doff right sock, Don/doff left sock Socks - Performed by patient: Don/doff right sock, Don/doff left sock   Shoes - Performed by patient: Don/doff right shoe Shoes - Performed by helper: Don/doff left shoe, Fasten right, Fasten left          Lower body assist Assist for lower body dressing: Touching or steadying assistance (Pt > 75%)      Toileting Toileting   Toileting steps completed by patient: Adjust clothing prior to toileting, Performs perineal hygiene, Adjust clothing after toileting   Toileting Assistive Devices: Grab bar or Photographer  level: Supervision or verbal cues   Transfers Chair/bed transfer   Chair/bed transfer method: Stand pivot Chair/bed transfer assist level: Touching or steadying assistance (Pt > 75%) Chair/bed transfer assistive device: Armrests, Medical sales representative     Max  distance: 37ft Assist level: Supervision or verbal cues   Wheelchair   Type: Manual Max wheelchair distance: 151ft Assist Level: Supervision or verbal cues  Cognition Comprehension Comprehension assist level: Follows basic conversation/direction with no assist(with interpreter or family members)  Expression Expression assist level: Expresses basic 90% of the time/requires cueing < 10% of the time.  Social Interaction Social Interaction assist level: Interacts appropriately 90% of the time - Needs monitoring or encouragement for participation or interaction.  Problem Solving Problem solving assist level: Solves basic problems with no assist  Memory Memory assist level: Recognizes or recalls 90% of the time/requires cueing < 10% of the time     Medical Problem List and Plan: 1.Functional and mobility deficitssecondary to debility after multiple medical issues  Cont CIR 2. DVT Prophylaxis/Anticoagulation: Pharmaceutical:Lovenox  HIT negative 3.DJD bilateral knees/Pain Management:tylenol prn with local measures. 4. Mood:LCSW to follow for evaluation and support. 5. Neuropsych: This patientiscapable of making decisions on herown behalf. 6. Skin/Wound Care:routine pressure relief measures 7. Fluids/Electrolytes/Nutrition:Monitor I/O.   D3 nectars, advanced that his tolerated 8. ESP:QZRAQTM BP bid. On coreg bid. Prinzide on hold due to hypotension.  Controlled on 9/16 9. GERD:Continue Protonix. 10. Episode of A fib with RVR: Due to stress and resolved with amiodarone. Sick euthyroid--will need TSH repeated in 3-4 weeks.Will need 30 day monitor at discharge 11. Hyperlipidemia: Lipitor on hold due to poor intake. 12. Subacute ILD: Slow steroid taper initiated on 9/14 13. Urinary retention: Improving  PVRs relatively unremarkable.  14. Steroid induced hyperglycemia on prediabetes:   Hgb A1C 6.3  Will change to Carb modified diet when patient on regular consistencies,  continue CBGs  Monitor BS ac/hs and use SSI for elevated BS.   Labile on 9/16 16. Pre-renal azotemia: Encourage nectar fluid intake.  Improving, creatinine 0.75 on 9/11  Labs pending 17. Hypokalemia  Potassium critictal value 2.7 on 9/16, repeat ordered  Supplemented x1 day  Labs pending  18. Hypoalbuminemia  Supplement initiated on 9/11 19. Transaminitis  ALT elevated on 9/11  Labs pending  Continue to monitor 20. Acute blood loss anemia on ? Anemia of chronic disease  Hemoglobin 9.7 on 9/11  Labs pending  Continue to monitor 21. Thrombocytopenia  Platelets 9.4 on 9/11  Labs pending  Continue to monitor 22. Supplemental oxygen dependent  Weaned at present  LOS (Days) 6 A FACE TO FACE EVALUATION WAS PERFORMED  Ankit Lorie Phenix 01/05/2018 8:12 AM

## 2018-01-05 NOTE — Progress Notes (Signed)
PIV inserted per consult. RN Maudry Mayhew verbalized that patient might need midline since patient will received K runs.  Sandie Ano RN IV VAST team

## 2018-01-05 NOTE — Progress Notes (Signed)
CRITICAL VALUE ALERT  Critical Value:  Potasium 2.7 Date & Time Notied: 01/05/18. 8:55 AM  Provider Notified: Reesa Chew [facility]  Orders Received/Actions taken: Recheck Potasium levels. Start an IV

## 2018-01-05 NOTE — Progress Notes (Signed)
Appears to be resting w/o acute distress or discomfort, respiration even and unlabored,family member at bedside, Sleep chart in progress and monitored, Call bell within easy reach. Continue medical  regime

## 2018-01-05 NOTE — Progress Notes (Signed)
CRITICAL VALUE ALERT  Critical Value:  Potassium 2.7  Date & Time Notied:  01/05/2018 1000  Provider Notified: P. Love, PA-c  Orders Received/Actions taken: No new orders at this time.

## 2018-01-05 NOTE — Progress Notes (Signed)
Occupational Therapy Note  Patient Details  Name: Tasha [name] MRN: [id number] Date of Birth: 19-Feb-1951  Pt missed 75 mins OT session secondary to low potassium and nursing/PA recommendations to hold therapy at this time.    Grayson White OTR/L 01/05/2018, 10:21 AM

## 2018-01-05 NOTE — Progress Notes (Signed)
On call notified Jeannene Patella Love, PA) related to IV potassium runs and current lab results, order receive to stop runs after current bag infuse.., monitor closely

## 2018-01-05 NOTE — Progress Notes (Signed)
Physical Therapy Session Note  Patient Details  Name: Levy Wellman MRN: [id number] Date of Birth: May 16, 1950  Today's Date: 01/05/2018     Short Term Goals: Week 1:  PT Short Term Goal 1 (Week 1): Pt will perform bed mobility with supervision Assist  PT Short Term Goal 2 (Week 1): Pt will ambulate 82ft with CGA and LRAD  PT Short Term Goal 3 (Week 1): WC mobility x 14ft with CGA  PT Short Term Goal 4 (Week 1): Pt will perform stand pivot transfers with supervision assist and LRAD   Skilled Therapeutic Interventions/Progress Updates: Pt missed 60 minutes skilled PT due to medical hold. Pt's K level currently critically low at 2.7. Will continue efforts when medically stable.      Therapy Documentation Precautions:  Precautions Precautions: Other (comment) Precaution Comments: low O2 sats on  2L supplemental O2 with mobility. Restrictions Weight Bearing Restrictions: No General: PT Amount of Missed Time (min): 60 Minutes PT Missed Treatment Reason: MD hold (Comment) Vital Signs: Therapy Vitals Temp: 98.6 F (37 C) Temp Source: Oral Pulse Rate: 75 Resp: 16 BP: 125/68 Patient Position (if appropriate): Sitting Oxygen Therapy SpO2: 96 % O2 Device: Room Air  See Function Navigator for Current Functional Status.   Therapy/Group: Individual Therapy  Zoeya Gramajo  Tareka Jhaveri, PTA  01/05/2018, 3:37 PM

## 2018-01-05 NOTE — Progress Notes (Signed)
Social Work Assessment and Plan  Patient Details  Name: Tasha [name] MRN: [id number] Date of Birth: 1950/10/03  Today's Date: 12/31/2017  Problem List:  Patient Active Problem List   Diagnosis Date Noted  . Interstitial lung disease (HCC)   . Hypotension   . Dysphagia   . Thrombocytopenia (HCC)   . Transaminitis   . Hypokalemia   . Prediabetes   . Urinary retention   . History of atrial fibrillation   . Debility 12/30/2017  . Persistent atrial fibrillation (HCC)   . Prerenal azotemia   . Supplemental oxygen dependent   . Metabolic syndrome   . Tachypnea   . Steroid-induced hyperglycemia   . Hypoalbuminemia due to protein-calorie malnutrition (HCC)   . Acute blood loss anemia   . ARDS (adult respiratory distress syndrome) (HCC)   . Barotrauma   . Chest tube in place   . On mechanically assisted ventilation (HCC)   . Atrial fibrillation with rapid ventricular response (HCC)   . Pneumothorax   . Difficult intravenous access   . Encounter for central line placement   . BOOP (bronchiolitis obliterans with organizing pneumonia) (HCC)   . Acute respiratory failure (HCC)   . CAP (community acquired pneumonia)   . Depression 03/12/2016  . BMI 25.0-25.9,adult 02/20/2016  . Allergy-induced asthma 08/10/2014  . Gastroesophageal reflux disease without esophagitis 08/10/2014  . Hyperlipidemia with target LDL less than 100 05/04/2013  . Hypertension 09/03/2012   Past Medical History:  Past Medical History:  Diagnosis Date  . Acute respiratory failure (HCC)   . CAP (community acquired pneumonia)   . Hyperlipidemia   . Hypertension   . Metabolic syndrome    Past Surgical History:  Past Surgical History:  Procedure Laterality Date  . CESAREAN SECTION     Social History:  reports that she has never smoked. She has never used smokeless tobacco. She reports that she does not drink alcohol or use drugs.  Family / Support Systems Marital Status: Married Patient Roles:  Spouse, Parent, Other (Comment)(grandparent) Spouse/Significant Other: husband - spanish speaking only Children: Tasha [name] - dtr - [phone] (m); [phone] (w) (bilingual) Other Supports: son, dtr-in-law, dtr Anticipated Caregiver: spouse, son (dtr-in-law) and daughter Ability/Limitations of Caregiver: all can provide 24/7 A together Caregiver Availability: 24/7 Family Dynamics: close, supportive   Social History Preferred language: Spanish Religion:  Employment Status: Retired Marine scientist Issues: none reported Guardian/Conservator: N/A - MD has determined that pt is capable of making her own decisions.   Abuse/Neglect Abuse/Neglect Assessment Can Be Completed: Yes Physical Abuse: Denies Verbal Abuse: Denies Sexual Abuse: Denies Exploitation of patient/patient's resources: Denies Self-Neglect: Denies  Emotional Status Pt's affect, behavior and adjustment status: Pt reports through spanish interpreter that she is feeling positive about her recovering and is ready to work hard to get better. Recent Psychosocial Issues: none reported Psychiatric History: none reported Substance Abuse History: none reported  Patient / Family Perceptions, Expectations & Goals Pt/Family understanding of illness & functional limitations: Pt/dtr Tasha [name]) report a good understanding of pt's condition and limitations and feel the medical team has provided good information. Premorbid pt/family roles/activities: Pt did all of the cooking and cleaning at home and enjoys spending time with her family. Anticipated changes in roles/activities/participation: Pt would like to resume activities as she is able. Pt/family expectations/goals: Pt would like to not need the oxygen and to be able to take care of herself when she goes home.  Community [employer] Agencies: None Premorbid Home  Care/DME Agencies: None Transportation available at discharge: family Resource  referrals recommended: Neuropsychology  Discharge Planning Living Arrangements: Spouse/significant other Support Systems: Spouse/significant other, Children, Other relatives Type of Residence: Private residence Insurance Resources: [employer] Financial Resources: Restaurant manager, fast food Screen Referred: No Money Management: Patient, Spouse Does the patient have any problems obtaining your medications?: No Home Management: Pt takes care of inside chores and husband tends to outside of home. Patient/Family Preliminary Plans: Pt plans to return to her home and her family to provide 24/7 care as needed there. Social Work Anticipated Follow Up Needs: HH/OP Expected length of stay: 10 to 14 days  Clinical Impression CSW met with pt and her dtr, Tasha [name], with assistance of interpreter to introduce self and role of CSW, as well as to complete assessment.  Pt and dtr are pleased that pt could come to CIR and pt is ready to get better.  Pt has good family support and husband and other family members will be with her, as needed.  Pt/dtr have not current concerns/needs/questions, but CSW will continue to follow and assist as needed.  Tasha [name], [place] 01/05/2018, 4:35 PM

## 2018-01-05 NOTE — Progress Notes (Signed)
Speech Language Pathology Daily Session Note  Patient Details  Name: Rhythm Gubbels MRN: [id number] Date of Birth: 1950/07/06  Today's Date: 01/05/2018 SLP Individual Time: 0800-0900 SLP Individual Time Calculation (min): 60 min  Short Term Goals: Week 1: SLP Short Term Goal 1 (Week 1): Pt will consume therapeutic trials of thin liquids with mod I use of swallowing precautions and minimal overt s/s of aspiration over 3 consecutive sessions prior to repeat MBS.  SLP Short Term Goal 2 (Week 1): Pt will use memory compensatory strategies to facilitate recall of daily information with mod I.  SLP Short Term Goal 3 (Week 1): Pt will complete x50 repetitions each of IMST and EMST with mod I use of device and self reported effort leve of <7/10.    Skilled Therapeutic Interventions: Skilled treatment session focused on dysphagia goals. SLP facilitated session by providing information regarding water protocol, providing skilled observation of pt consuming thin water via cup sips (8 oz with no overt s/s of aspiration) and supervision for completion of 50 reps of IMST and EMST with effort of 6 out of 10. Education provided on plan to complete MBS with Elmyra Ricks (SLP) on next date and nature/location of study. All questions answered to pt and daughter's satisfaction. Pt left sitting edge of bed with all needs within reach. Continue per current plan of care.      Function:  Eating Eating   Modified Consistency Diet: No Eating Assist Level: Swallowing techniques: self managed           Cognition Comprehension Comprehension assist level: Follows basic conversation/direction with no assist  Expression   Expression assist level: Expresses basic 90% of the time/requires cueing < 10% of the time.  Social Interaction Social Interaction assist level: Interacts appropriately 90% of the time - Needs monitoring or encouragement for participation or interaction.  Problem Solving Problem solving assist level:  Solves basic problems with no assist  Memory Memory assist level: Recognizes or recalls 90% of the time/requires cueing < 10% of the time    Pain Pain Assessment Pain Scale: 0-10 Pain Score: 0-No pain  Therapy/Group: Individual Therapy  Lesslie Mckeehan 01/05/2018, 9:20 AM

## 2018-01-05 NOTE — Progress Notes (Signed)
[facility] Individual Statement of Services  Patient Name:  Tasha [name]  Date:  01/05/2018  Welcome to the [facility].  Our goal is to provide you with an individualized program based on your diagnosis and situation, designed to meet your specific needs.  With this comprehensive rehabilitation program, you will be expected to participate in at least 3 hours of rehabilitation therapies Monday-Friday, with modified therapy programming on the weekends.  Your rehabilitation program will include the following services:  Physical Therapy (PT), Occupational Therapy (OT), Speech Therapy (ST), 24 hour per day rehabilitation nursing, Neuropsychology, Case Management (Social Worker), Rehabilitation Medicine, Nutrition Services and Pharmacy Services  Weekly team conferences will be held on Wednesdays to discuss your progress.  Your Social Worker will talk with you frequently to get your input and to update you on team discussions.  Team conferences with you and your family in attendance may also be held.  Expected length of stay:  10 to 14 days  Overall anticipated outcome:   Supervision  Depending on your progress and recovery, your program may change. Your Social Worker will coordinate services and will keep you informed of any changes. Your Social Worker's name and contact numbers are listed  below.  The following services may also be recommended but are not provided by the [facility] will be made to provide these services after discharge if needed.  Arrangements include referral to agencies that provide these services.  Your insurance has been verified to be:  Medicare Your primary doctor is:  Dr. Vonna Kotyk [place]  Pertinent information will be shared with your doctor and your insurance company.  Social Worker:  Alfonse Alpers, LCSW  [phone] or ([phone]  Information discussed with and copy given to patient by: Tasha [name], 01/05/2018, 11:19 AM

## 2018-01-06 ENCOUNTER — Inpatient Hospital Stay (HOSPITAL_COMMUNITY): Payer: Medicare Other | Admitting: Occupational Therapy

## 2018-01-06 ENCOUNTER — Inpatient Hospital Stay (HOSPITAL_COMMUNITY): Payer: Medicare Other

## 2018-01-06 ENCOUNTER — Inpatient Hospital Stay (HOSPITAL_COMMUNITY): Payer: Medicare Other | Admitting: Physical Therapy

## 2018-01-06 ENCOUNTER — Inpatient Hospital Stay (HOSPITAL_COMMUNITY): Payer: Medicare Other | Admitting: Speech Pathology

## 2018-01-06 DIAGNOSIS — R040 Epistaxis: Secondary | ICD-10-CM

## 2018-01-06 LAB — BASIC METABOLIC PANEL
Anion gap: 10 (ref 5–15)
BUN: 22 mg/dL (ref 8–23)
CHLORIDE: 112 mmol/L — AB (ref 98–111)
CO2: 22 mmol/L (ref 22–32)
CREATININE: 0.84 mg/dL (ref 0.44–1.00)
Calcium: 8.5 mg/dL — ABNORMAL LOW (ref 8.9–10.3)
GFR calc Af Amer: >60 mL/min (ref >60)
GFR calc non Af Amer: >60 mL/min (ref >60)
GLUCOSE: 80 mg/dL (ref 70–99)
Potassium: 3.9 mmol/L (ref 3.5–5.1)
Sodium: 144 mmol/L (ref 135–145)

## 2018-01-06 LAB — GLUCOSE, CAPILLARY
GLUCOSE-CAPILLARY: 107 mg/dL — AB (ref 70–99)
GLUCOSE-CAPILLARY: 119 mg/dL — AB (ref 70–99)
Glucose-Capillary: 82 mg/dL (ref 70–99)
Glucose-Capillary: 140 mg/dL — ABNORMAL HIGH (ref 70–99)

## 2018-01-06 MED ORDER — SALINE SPRAY 0.65 % NA SOLN: 1.0000 | NASAL | Status: DC | PRN

## 2018-01-06 MED ORDER — SALINE SPRAY 0.65 % NA SOLN
1.0000 | Freq: Three times a day (TID) | NASAL | Status: DC
  Administered 2018-01-06 – 2018-01-09 (×10): 1 via NASAL

## 2018-01-06 MED ORDER — SALINE SPRAY 0.65 % NA SOLN
1.0000 | NASAL | Status: DC | PRN
  Filled 2018-01-06: qty 44

## 2018-01-06 NOTE — Progress Notes (Signed)
Physical Therapy Weekly Progress Note  Patient Details  Name: Tasha [name] MRN: [id number] Date of Birth: 1950-12-25  Beginning of progress report period: December 31, 2017 End of progress report period: December 27, 2017  Today's Date: 01/06/2018 PT Individual Time: 8295-6213 PT Individual Time Calculation (min): 70 min   Patient has met 4 of 4 short term goals.  Pt has made good progress during current week of therapy. She has been weaned to RA and is able to ambulate distances of 264f with RW however with a notably decreased gait speed. She continues to be limited by endurance and BLE weakness.   Patient continues to demonstrate the following deficits muscle weakness and decreased cardiorespiratoy endurance and therefore will continue to benefit from skilled PT intervention to increase functional independence with mobility.  Patient progressing toward long term goals..  Continue plan of care.  PT Short Term Goals Week 1:  PT Short Term Goal 1 (Week 1): Pt will perform bed mobility with supervision Assist  PT Short Term Goal 1 - Progress (Week 1): Met PT Short Term Goal 2 (Week 1): Pt will ambulate 737fwith CGA and LRAD  PT Short Term Goal 2 - Progress (Week 1): Met PT Short Term Goal 3 (Week 1): WC mobility x 10042fith CGA  PT Short Term Goal 3 - Progress (Week 1): Met PT Short Term Goal 4 (Week 1): Pt will perform stand pivot transfers with supervision assist and LRAD  PT Short Term Goal 4 - Progress (Week 1): Met Week 2:  PT Short Term Goal 1 (Week 2): STG=LTG due to ELOS  Skilled Therapeutic Interventions/Progress Updates: Pt presented in recliner with interpreter present agreeable to therapy. Pt performed sit to stand with RW and supervision. Pt ambulated approx 150f53fth RW with decreased gait speed for energy conservation. SpO2 checked after seated rest at 92%. Pt then transported to front entrance of hospital and participated in gait training in outside setting. Pt  ambulated around outside patio CGA fading to close supervision x 3 laps with seated rest between bouts. SpO2 >92% after each bout of ambulation. Pt also performed STS from patio bench x 5 with emphasis on use of glutes by shifting buttock back vs bending knees forward. Pt transported back to rehab gym and performed step ups in parallel bars with x 4 in step for BLE strengthening and endurance, 2 x 5 bilaterally forward and lateral step ups. SpO2 maintained >90% throughout activity. Pt ambulated back to room with RW and requesting to use toilet, all performed at close S. Pt left sitting EOB with dgt-in-law present, call bell within reach, and current needs met.      Therapy Documentation Precautions:  Precautions Precautions: Fall Precaution Comments: monitor 02 Restrictions Weight Bearing Restrictions: No General:   Vital Signs: Therapy Vitals Temp: 98.1 F (36.7 C) Pulse Rate: 80 Resp: 16 BP: 137/81 Patient Position (if appropriate): Sitting Oxygen Therapy SpO2: 98 % O2 Device: Room Air Pain: Pain Assessment Pain Scale: 0-10 Pain Score: 0-No pain     See Function Navigator for Current Functional Status.  Therapy/Group: Individual Therapy  Rosita DeChalus 01/06/2018, 4:06 PM
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
## 2018-01-06 NOTE — Progress Notes (Signed)
Informed by assigned NT of O2 saturation 89-90%, patient assessed w/o noted respiratory distress, discomfort or pain , patient readjusted in bed with HOB elevated,then reassessed O2 sat shows 96% on RA.Marland Kitchen Provided clarification to family member for emotional support, closely monitor

## 2018-01-06 NOTE — Progress Notes (Signed)
Modified Barium Swallow Progress Note  Patient Details  Name: Tasha [name] MRN: [id number] Date of Birth: April 09, 1951  Today's Date: 01/06/2018  Modified Barium Swallow completed.  Full report located under Chart Review in the Imaging Section.  Brief recommendations include the following:  Clinical Impression      Pt presents with mildly decreased bolus cohesion with large sips of thin liquids and mixed consistencies.  She also has mild pharyngeal residue at the pyriforms and vallecula with larger boluses.  Otherwise, her swallow response is timely with good airway protection during the swallow.  No aspiration or penetration was evident during today's study.  As a result, recommend advancing pt's diet to regular textures and thin liquids, meds may be administered whole with sips of liquids.      Swallow Evaluation Recommendations       SLP Diet Recommendations: Regular solids;Thin liquid   Liquid Administration via: Cup;Straw   Medication Administration: Whole meds with liquid   Supervision: Patient able to self feed;Intermittent supervision to cue for compensatory strategies   Compensations: Slow rate;Small sips/bites   Postural Changes: Remain semi-upright after after feeds/meals (Comment)            Loring Liskey, Elmyra Ricks L 01/06/2018,1:54 PM

## 2018-01-06 NOTE — Progress Notes (Signed)
Speech Language Pathology Daily Session Note  Patient Details  Name: Tasha [name] MRN: [id number] Date of Birth: 12/23/1950  Today's Date: 01/06/2018 SLP Individual Time: 0488-8916 SLP Individual Time Calculation (min): 37 min  Short Term Goals: Week 1: SLP Short Term Goal 1 (Week 1): Pt will consume therapeutic trials of thin liquids with mod I use of swallowing precautions and minimal overt s/s of aspiration over 3 consecutive sessions prior to repeat MBS.  SLP Short Term Goal 2 (Week 1): Pt will use memory compensatory strategies to facilitate recall of daily information with mod I.  SLP Short Term Goal 3 (Week 1): Pt will complete x50 repetitions each of IMST and EMST with mod I use of device and self reported effort leve of <7/10.    Skilled Therapeutic Interventions:  Pt was seen for skilled ST targeting dysphagia goals immediately following MBS.  Pt was transported back to room by therapist and all questions regarding results of study and recommendations were answered to pt's and family's satisfaction at this time.  Pt consumed sips of thin liquids with mod I use of swallowing precautions and no overt s/s of aspiration.  Pt completed 50 repetitions each of IMST and EMST with a self perceived effort level of 8 and 7 out of 10 respectively.  Pt was left in recliner with call bell within reach and family at bedside.  Continue per current plan of care.    Function:  Eating Eating   Modified Consistency Diet: No Eating Assist Level: Swallowing techniques: self managed           Cognition Comprehension Comprehension assist level: Follows basic conversation/direction with no assist  Expression   Expression assist level: Expresses basic needs/ideas: With extra time/assistive device  Social Interaction Social Interaction assist level: Interacts appropriately with others with medication or extra time (anti-anxiety, antidepressant).  Problem Solving Problem solving assist level: Solves  basic problems with no assist  Memory Memory assist level: Recognizes or recalls 90% of the time/requires cueing < 10% of the time    Pain Pain Assessment Pain Scale: 0-10 Pain Score: 0-No pain  Therapy/Group: Individual Therapy  Tasha [name], Tasha [name] 01/06/2018, 1:55 PM

## 2018-01-06 NOTE — Progress Notes (Signed)
Occupational Therapy Session Note  Patient Details  Name: Tasha [name] MRN: [id number] Date of Birth: 09/09/1950  Today's Date: 01/06/2018 OT Individual Time: 1003-1100 OT Individual Time Calculation (min): 57 min    Short Term Goals: Week 1:  OT Short Term Goal 1 (Week 1): Pt will perform 3/3 steps of toileting with CGA.  OT Short Term Goal 2 (Week 1): Pt will perform sit<>stand during ADL tasks with CGA.  OT Short Term Goal 3 (Week 1): Pt will tolerate standing >5 min with VSS prior to needing seated rest break.  OT Short Term Goal 4 (Week 1): Pt will perform 3/3 steps of LB dressing with CGA.  OT Short Term Goal 5 (Week 1): Pt will demonstrate toilet transfer using LRAD and CGA.   Skilled Therapeutic Interventions/Progress Updates:    Pt declined bathing and dressing this session secondary to female therapist.  Arranged scheduling in the future to eliminate this issue.  Had pt ambulate with use of the RW for support to the ADL apartment with supervision and slower rate of speed.  Once in the apartment she completed transfers to the couch and the tub bench with supervision and min instructional cueing for technique and hand placement.  Discussed need for a hand held shower as well as removal of shower doors when using the bench.  Attempted stepping over the edge of the tub but she was unable to complete.  Next had pt ambulate up to the therapy gym where she sat on a mat and worked on [employer].  She completed  2 sets of 5 repetitions shoulder flexion AAROM with 1.5 lb wrist weight on each hand.  She was able to complete 3 reps on her own before therapist had to provide min assist.  Had her also complete 1 set of 20 reps for bilateral elbow flexion.  Completed 3 sets of 10 reps for slow ball toss overhead with the weights still on her wrists as well.  Two to 3 mins rest between sets.  Oxygen sats greater than 91% after all exercises and mobility.  Finished session with return to the room  via walker with supervision.  Pt left in chair with call button in reach and interpreter present as well.    Therapy Documentation Precautions:  Precautions Precautions: Fall Precaution Comments: monitor 02 Restrictions Weight Bearing Restrictions: No   Vital Signs: Therapy Vitals Pulse Rate: 84 Patient Position (if appropriate): Sitting Oxygen Therapy SpO2: 94 % O2 Device: Room Air Pain: Pain Assessment Pain Score: 0-No pain ADL: See Function Navigator for Current Functional Status.   Therapy/Group: Individual Therapy  Zurie Platas OTR/L 01/06/2018, 12:16 PM

## 2018-01-06 NOTE — Plan of Care (Signed)
  Problem: Consults Goal: RH GENERAL PATIENT EDUCATION Description See Patient Education module for education specifics. Outcome: Progressing Goal: Skin Care Protocol Initiated - if Braden Score 18 or less Description If consults are not indicated, leave blank or document N/A Outcome: Progressing Goal: Nutrition Consult-if indicated Outcome: Progressing   Problem: RH BOWEL ELIMINATION Goal: RH STG MANAGE BOWEL WITH ASSISTANCE Description STG Manage Bowel with min. Assistance.  Outcome: Progressing Goal: RH STG MANAGE BOWEL W/MEDICATION W/ASSISTANCE Description STG Manage Bowel with Medication with min. Assistance.  Outcome: Progressing   Problem: RH BLADDER ELIMINATION Goal: RH STG MANAGE BLADDER WITH ASSISTANCE Description STG Manage Bladder With min. Assistance  Outcome: Progressing Goal: RH STG MANAGE BLADDER WITH MEDICATION WITH ASSISTANCE Description STG Manage Bladder With Medication With min.Assistance.  Outcome: Progressing   Problem: RH SKIN INTEGRITY Goal: RH STG SKIN FREE OF INFECTION/BREAKDOWN Description With min. assist  Outcome: Progressing Goal: RH STG MAINTAIN SKIN INTEGRITY WITH ASSISTANCE Description STG Maintain Skin Integrity With min.Assistance.  Outcome: Progressing   Problem: RH SAFETY Goal: RH STG ADHERE TO SAFETY PRECAUTIONS W/ASSISTANCE/DEVICE Description STG Adhere to Safety Precautions With mod. Assistance/Device.  Outcome: Progressing Goal: RH STG DECREASED RISK OF FALL WITH ASSISTANCE Description STG Decreased Risk of Fall With mod. Assistance.  Outcome: Progressing   Problem: RH PAIN MANAGEMENT Goal: RH STG PAIN MANAGED AT OR BELOW PT'S PAIN GOAL Description Less than 3, on 1 to 10 scale  Outcome: Progressing   Problem: RH KNOWLEDGE DEFICIT GENERAL Goal: RH STG INCREASE KNOWLEDGE OF SELF CARE AFTER HOSPITALIZATION Description Able to verbalized when to call MD. And the precautions when she gets home.  Outcome: Progressing

## 2018-01-06 NOTE — Progress Notes (Signed)
01/05/2018 Late entry Patient received a total of 3 IV KCL runs, on call aware  Algis Liming, PA

## 2018-01-06 NOTE — Progress Notes (Signed)
Upon rounding patient with Epistaxis episode sitting at bedside, Dr Posey Pronto informed , monitor

## 2018-01-06 NOTE — Progress Notes (Signed)
[facility]     PROGRESS NOTE  Subjective/Complaints:  Patient seen sitting up at the edge of her bed this morning.  She states she slept well overnight.  Daughter in law at bedside translates.  She notes that she had a nosebleed this morning as well as yesterday and cracked lips due to the dry air.  ROS: Denies CP, SOB, nausea, vomiting, diarrhea.  Objective: Vital Signs: Blood pressure 138/67, pulse 70, temperature 97.9 F (36.6 C), temperature source Oral, resp. rate 18, weight 68.2 kg, SpO2 94 %. No results found. Recent Labs    01/05/18 0658  WBC 8.0  HGB 8.4*  HCT 25.8*  PLT 86*   Recent Labs    01/05/18 0858 01/05/18 1841  NA 144 143  K 2.7* 4.0  CL 112* 112*  GLUCOSE 131* 143*  BUN 23 26*  CREATININE 0.94 0.92  CALCIUM 8.0* 8.2*   CBG (last 3)  Recent Labs    01/05/18 1657 01/05/18 2107 01/06/18 0629  GLUCAP 151* 118* 82    Wt Readings from Last 3 Encounters:  01/06/18 68.2 kg  12/30/17 66.9 kg  12/05/17 68.5 kg    Physical Exam:  BP 138/67 (BP Location: Right Arm)   Pulse 70   Temp 97.9 F (36.6 C) (Oral)   Resp 18   Wt 68.2 kg   SpO2 94%   BMI 28.41 kg/m  Constitutional: She appearswell-developedand well-nourished.  HENT:normocephalic. Atraumatic Eyes:EOMI. No discharge. Cardiovascular: RRR.  No JVD. Respiratory:Effort normal and breath sounds normal.  VX:BLTJQ sounds are normal. She exhibitsno distension.  Musculoskeletal: No edema or tenderness in extremities Neurological: Dysphonia, improving  Motor: Grossly 4+/5 proximal to distal, unchanged Skin: Skin iswarm.  Psychiatric: She has anormal mood and affect. Herbehavior is normal.  Assessment/Plan: 1. Functional deficits secondary to debility which require 3+ hours per day of interdisciplinary therapy in a comprehensive inpatient rehab setting. Physiatrist is providing close team supervision and 24 hour management of active medical  problems listed below. Physiatrist and rehab team continue to assess barriers to discharge/monitor patient progress toward functional and medical goals.  Function:  Bathing Bathing position   Position: Shower  Bathing parts Body parts bathed by patient: Right arm, Left arm, Chest, Abdomen, Front perineal area, Buttocks, Right upper leg, Left upper leg, Right lower leg, Left lower leg, Back Body parts bathed by helper: Back  Bathing assist Assist Level: Touching or steadying assistance(Pt > 75%)      Upper Body Dressing/Undressing Upper body dressing   What is the patient wearing?: Pull over shirt/dress     Pull over shirt/dress - Perfomed by patient: Thread/unthread right sleeve, Thread/unthread left sleeve, Put head through opening, Pull shirt over trunk Pull over shirt/dress - Perfomed by helper: Pull shirt over trunk        Upper body assist Assist Level: Supervision or verbal cues      Lower Body Dressing/Undressing Lower body dressing   What is the patient wearing?: Non-skid slipper socks, Pants     Pants- Performed by patient: Thread/unthread right pants leg, Thread/unthread left pants leg, Pull pants up/down     Non-skid slipper socks- Performed by helper: Don/doff right sock, Don/doff left sock Socks - Performed by patient: Don/doff right sock, Don/doff left sock   Shoes - Performed by patient: Don/doff right shoe Shoes - Performed by helper: Don/doff left shoe, Fasten right, Fasten left          Lower body assist Assist for lower body  dressing: Touching or steadying assistance (Pt > 75%)      Toileting Toileting   Toileting steps completed by patient: Adjust clothing prior to toileting, Performs perineal hygiene, Adjust clothing after toileting   Toileting Assistive Devices: Grab bar or rail  Toileting assist Assist level: Supervision or verbal cues   Transfers Chair/bed transfer   Chair/bed transfer method: Stand pivot Chair/bed transfer assist level:  Touching or steadying assistance (Pt > 75%) Chair/bed transfer assistive device: Armrests, Medical sales representative     Max distance: 49ft Assist level: Supervision or verbal cues   Wheelchair   Type: Manual Max wheelchair distance: 122ft Assist Level: Supervision or verbal cues  Cognition Comprehension Comprehension assist level: Follows basic conversation/direction with no assist  Expression Expression assist level: Expresses basic 90% of the time/requires cueing < 10% of the time.  Social Interaction Social Interaction assist level: Interacts appropriately 90% of the time - Needs monitoring or encouragement for participation or interaction.  Problem Solving Problem solving assist level: Solves basic problems with no assist  Memory Memory assist level: Recognizes or recalls 90% of the time/requires cueing < 10% of the time     Medical Problem List and Plan: 1.Functional and mobility deficitssecondary to debility after multiple medical issues  Cont CIR 2. DVT Prophylaxis/Anticoagulation: Pharmaceutical:Lovenox  HIT negative 3.DJD bilateral knees/Pain Management:tylenol prn with local measures. 4. Mood:LCSW to follow for evaluation and support. 5. Neuropsych: This patientiscapable of making decisions on herown behalf. 6. Skin/Wound Care:routine pressure relief measures 7. Fluids/Electrolytes/Nutrition:Monitor I/O.   D3 nectars, advanced that his tolerated 8. BTD:HRCBULA BP bid. On coreg bid. Prinzide on hold due to hypotension.  Controlled on 9/17 9. GERD:Continue Protonix. 10. Episode of A fib with RVR: Due to stress and resolved with amiodarone. Sick euthyroid--will need TSH repeated in 3-4 weeks.Will need 30 day monitor at discharge 11. Hyperlipidemia: Lipitor on hold due to poor intake. 12. Subacute ILD: Slow steroid taper initiated on 9/14 13. Urinary retention: Improving  PVRs relatively unremarkable.  14. Steroid induced hyperglycemia on  prediabetes:   Hgb A1C 6.3  Will consider change to Carb modified diet when patient on regular consistencies if remains elevated after steroid taper completed, continue CBGs  Monitor BS ac/hs and use SSI for elevated BS.   Slightly labile on 9/17 16. Pre-renal azotemia: Encourage nectar fluid intake.  Improving, creatinine 0.92 on 9/16 17. Hypokalemia  Potassium 4.0 on 9/16  Continue supplementation 18. Hypoalbuminemia  Supplement initiated on 9/11 19. Transaminitis  ALT elevated on 9/16  Will discuss with pharmacy  Continue to monitor 20. Acute blood loss anemia on ? Anemia of chronic disease  Hemoglobin 8.4 on 9/16  Continue to monitor 21. Thrombocytopenia  HIT negative  Platelets 86 on 9/16  Will discuss with pharmacy  Continue to monitor 22. Supplemental oxygen dependent  Weaned at present 23.  Epistaxis  Saline spray ordered on 9/17  LOS (Days) 7 A FACE TO FACE EVALUATION WAS PERFORMED  Ankit Lorie Phenix 01/06/2018 8:03 AM

## 2018-01-06 NOTE — Progress Notes (Addendum)
Occupational Therapy Session Note  Patient Details  Name: Tasha [name] MRN: [id number] Date of Birth: Mar 23, 1951  Today's Date: 01/06/2018 OT Individual Time: 1115-1200 OT Individual Time Calculation (min): 45 min  Makeup Session   Short Term Goals: Week 1:  OT Short Term Goal 1 (Week 1): Pt will perform 3/3 steps of toileting with CGA.  OT Short Term Goal 2 (Week 1): Pt will perform sit<>stand during ADL tasks with CGA.  OT Short Term Goal 3 (Week 1): Pt will tolerate standing >5 min with VSS prior to needing seated rest break.  OT Short Term Goal 4 (Week 1): Pt will perform 3/3 steps of LB dressing with CGA.  OT Short Term Goal 5 (Week 1): Pt will demonstrate toilet transfer using LRAD and CGA.   Skilled Therapeutic Interventions/Progress Updates:    Makeup Session Co-tx with Recreational Therapist. Pt resting in recliner upon arrival with interpreter present.  OT intervention with focus on functional amb with RW, kitchen safety, RW safety, activity tolerance, and energy conservation strategies to increase independence with BADLs.  Pt amb with RW to ADL apartment/kitchen and practiced getting items from refrigerator and emptying contents into trash. Pt also practiced transporting/transferring container from one counter to another.  Discussed energy conservation strategies and RW/kitchen safety.  Pt verbalized understanding.  Pt returned to room and remained in recliner with all needs within reach.   Therapy Documentation Precautions:  Precautions Precautions: Fall Precaution Comments: monitor 02 Restrictions Weight Bearing Restrictions: No Pain: Pain Assessment Pain Scale: 0-10 Pain Score: 0-No pain  See Function Navigator for Current Functional Status.   Therapy/Group: Individual Therapy  Leroy Libman 01/06/2018, 12:54 PM

## 2018-01-07 ENCOUNTER — Inpatient Hospital Stay (HOSPITAL_COMMUNITY): Payer: Medicare Other | Admitting: Occupational Therapy

## 2018-01-07 ENCOUNTER — Inpatient Hospital Stay (HOSPITAL_COMMUNITY): Payer: Medicare Other | Admitting: Physical Therapy

## 2018-01-07 ENCOUNTER — Inpatient Hospital Stay (HOSPITAL_COMMUNITY): Payer: Medicare Other | Admitting: Speech Pathology

## 2018-01-07 DIAGNOSIS — R05 Cough: Secondary | ICD-10-CM

## 2018-01-07 DIAGNOSIS — R059 Cough, unspecified: Secondary | ICD-10-CM

## 2018-01-07 LAB — GLUCOSE, CAPILLARY
GLUCOSE-CAPILLARY: 101 mg/dL — AB (ref 70–99)
Glucose-Capillary: 88 mg/dL (ref 70–99)
Glucose-Capillary: 163 mg/dL — ABNORMAL HIGH (ref 70–99)
Glucose-Capillary: 154 mg/dL — ABNORMAL HIGH (ref 70–99)

## 2018-01-07 MED ORDER — IPRATROPIUM-ALBUTEROL 0.5-2.5 (3) MG/3ML IN SOLN
3.0000 mL | Freq: Four times a day (QID) | RESPIRATORY_TRACT | Status: DC
  Administered 2018-01-07: 3 mL via RESPIRATORY_TRACT
  Filled 2018-01-07: qty 3

## 2018-01-07 MED ORDER — IPRATROPIUM-ALBUTEROL 0.5-2.5 (3) MG/3ML IN SOLN: 3.0000 mL | Freq: Four times a day (QID) | RESPIRATORY_TRACT | Status: DC

## 2018-01-07 MED ORDER — IPRATROPIUM-ALBUTEROL 0.5-2.5 (3) MG/3ML IN SOLN: 3.0000 mL | Freq: Four times a day (QID) | RESPIRATORY_TRACT | Status: DC | PRN

## 2018-01-07 MED ORDER — BENZONATATE 100 MG PO CAPS: 100.0000 mg | ORAL_CAPSULE | Freq: Three times a day (TID) | ORAL | Status: DC

## 2018-01-07 MED ORDER — IPRATROPIUM-ALBUTEROL 0.5-2.5 (3) MG/3ML IN SOLN
3.0000 mL | Freq: Four times a day (QID) | RESPIRATORY_TRACT | Status: DC
  Administered 2018-01-08: 3 mL via RESPIRATORY_TRACT
  Filled 2018-01-07 (×2): qty 3

## 2018-01-07 NOTE — Progress Notes (Signed)
Speech Language Pathology Daily Session Note  Patient Details  Name: Tasha [name] MRN: [id number] Date of Birth: 1950-05-18  Today's Date: 01/07/2018 SLP Individual Time: 4034-7425 SLP Individual Time Calculation (min): 56 min  Short Term Goals: Week 1: SLP Short Term Goal 1 (Week 1): Pt will consume therapeutic trials of thin liquids with mod I use of swallowing precautions and minimal overt s/s of aspiration over 3 consecutive sessions prior to repeat MBS.  SLP Short Term Goal 2 (Week 1): Pt will use memory compensatory strategies to facilitate recall of daily information with mod I.  SLP Short Term Goal 3 (Week 1): Pt will complete x50 repetitions each of IMST and EMST with mod I use of device and self reported effort leve of <7/10.    Skilled Therapeutic Interventions:  Pt was seen for skilled ST targeting dysphagia and cognitive goals.  Pt completed 50 repetitions with IMST and EMST devices with mod I and a self perceived effort level of 6 and 7 respectively.  SLP facilitated the session with medication management tasks to address recall of new information.  Pt needed min cues to recall function and frequency of medications when named given that most of her medications are unfamiliar to her.   SLP provided pt with a written list of medications to facilitate carryover of new information.  Pt consumed thin liquids via cup sips with no overt s/s of aspiration and mod I use of swallowing precautions.  Pt was left in recliner with call bell within reach and family at bedside.  Continue per current plan of care.    Function:  Eating Eating   Modified Consistency Diet: No Eating Assist Level: Swallowing techniques: self managed           Cognition Comprehension Comprehension assist level: Follows basic conversation/direction with extra time/assistive device  Expression   Expression assist level: Expresses basic needs/ideas: With extra time/assistive device  Social Interaction Social  Interaction assist level: Interacts appropriately with others with medication or extra time (anti-anxiety, antidepressant).  Problem Solving Problem solving assist level: Solves basic problems with no assist  Memory Memory assist level: Recognizes or recalls 75 - 89% of the time/requires cueing 10 - 24% of the time    Pain Pain Assessment Pain Scale: 0-10 Pain Score: 0-No pain  Therapy/Group: Individual Therapy  Kieara Schwark, Selinda Orion 01/07/2018, 9:06 AM

## 2018-01-07 NOTE — Progress Notes (Signed)
RT came to give patient a breathing treatment and patient was eating. Patient said she does not feel that she needs one right now and refused so she can eat. She said she only feels the need for one after therapies due to becoming short of breath. Family member was in the room interpreting between patient and RT.

## 2018-01-07 NOTE — Progress Notes (Signed)
Physical Therapy Session Note  Patient Details  Name: Tasha [name] MRN: [id number] Date of Birth: Jun 25, 1950  Today's Date: 01/07/2018 PT Individual Time: 1005-1120 PT Individual Time Calculation (min): 75 min   Short Term Goals: Week 2:     Skilled Therapeutic Interventions/Progress Updates: Pt presented in recliner agreeable to therapy. Pt requesting if possible to take shower as OT session with female today. Pt ambulated to shower and was able to doff clothing sitting on bench with supervision. Pt performed shower sitting on TTB including washing hair with overall supervision and PTA handing towel to pt. PTA providing intermittent verbal cues to pace self and take breaks if feeling fatigued however pt indicated not feeling tired during shower. SpO2 checked while pt drying off at 89% quickly resolving to 90%. Pt was able to don clothing with set up and ambulated back to recliner with RW to comb/brush hair with SpO2 95% upon sitting. Pt's dgt assisted with combing hair and pt used blow dryer to complete dressing. Pt ambulated to rehab gym supervision with RW, SpO2 90% after ambulation. Pt performed stair training ascending 4 step x 2 with 2 rails and step to pattern. Pt indicating LLE continues to feel weaker than R therefore PTA advised on ascending leading with RLE and descending leading with LLE. SpO2 90% with HR low 100s after stair training. Performed step ups leading with LLE for strengthening x 10. Pt ambulated back to room at end of session in same manner as prior and requesting to return to bed. Performed sit to supine with CGA and bed flat. Pt positioned to comfort and left with bed alarm on, needs met and family present.      Therapy Documentation Precautions:  Precautions Precautions: Fall Precaution Comments: monitor 02 Restrictions Weight Bearing Restrictions: No General:   Vital Signs: Oxygen Therapy SpO2: 97 % O2 Device: Room Air Pain: Pain Assessment Pain Scale:  0-10 Pain Score: 0-No pain   See Function Navigator for Current Functional Status.   Therapy/Group: Individual Therapy  Joshaua Epple  Cainan Trull, PTA  01/07/2018, 12:37 PM

## 2018-01-07 NOTE — Progress Notes (Signed)
Occupational Therapy Session Note  Patient Details  Name: Tasha [name] MRN: [id number] Date of Birth: July 19, 1950  Today's Date: 01/07/2018 OT Individual Time: 1300-1401 OT Individual Time Calculation (min): 61 min    Short Term Goals: Week 1:  OT Short Term Goal 1 (Week 1): Pt will perform 3/3 steps of toileting with CGA.  OT Short Term Goal 2 (Week 1): Pt will perform sit<>stand during ADL tasks with CGA.  OT Short Term Goal 3 (Week 1): Pt will tolerate standing >5 min with VSS prior to needing seated rest break.  OT Short Term Goal 4 (Week 1): Pt will perform 3/3 steps of LB dressing with CGA.  OT Short Term Goal 5 (Week 1): Pt will demonstrate toilet transfer using LRAD and CGA.   Skilled Therapeutic Interventions/Progress Updates:    Pt completed functional mobility with the RW to the dayroom with supervision.  Oxygen sats 91% or greater on room air after walk.  Had pt complete Nustep for 1 interval of 5 mins using arms and legs together, then 4 sets of 2 mins with isolated UE and LE use.  Steps per minute were 36-38 for each set.  Rest breaks of 2-3 mins needed between each set secondary to fatigue.  HR increased only into the upper 80s with each set.  Next had pt ambulate down to the tub/shower room with supervision to educate pt and daughter on safe performance of tub/shower transfers using the tub bench.  Discussed need for bench to transfer into and out of the shower.  Also discussed removal of throw rugs and need for a hand held shower.  Finished session with return to the room and pt completing transfer to the toilet with supervision and daughter safely assist.  Checked daughter to assist with all toilet transfers from now on when she's present.  Call button and phone in reach.  Interpreter present throughout session as well.     Therapy Documentation Precautions:  Precautions Precautions: Fall Precaution Comments: monitor 02 Restrictions Weight Bearing Restrictions:  No  Pain: Pain Assessment Pain Score: 0-No pain ADL: See Function Navigator for Current Functional Status.   Therapy/Group: Individual Therapy  [name] OTR/L 01/07/2018, 2:52 PM
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
## 2018-01-08 ENCOUNTER — Inpatient Hospital Stay (HOSPITAL_COMMUNITY): Payer: Medicare Other | Admitting: Physical Therapy

## 2018-01-08 ENCOUNTER — Inpatient Hospital Stay (HOSPITAL_COMMUNITY): Payer: Medicare Other | Admitting: Occupational Therapy

## 2018-01-08 ENCOUNTER — Inpatient Hospital Stay (HOSPITAL_COMMUNITY): Payer: Medicare Other | Admitting: Speech Pathology

## 2018-01-08 LAB — GLUCOSE, CAPILLARY
GLUCOSE-CAPILLARY: 139 mg/dL — AB (ref 70–99)
GLUCOSE-CAPILLARY: 142 mg/dL — AB (ref 70–99)
Glucose-Capillary: 85 mg/dL (ref 70–99)
Glucose-Capillary: 117 mg/dL — ABNORMAL HIGH (ref 70–99)

## 2018-01-08 MED ORDER — IPRATROPIUM-ALBUTEROL 0.5-2.5 (3) MG/3ML IN SOLN: 3.0000 mL | Freq: Four times a day (QID) | RESPIRATORY_TRACT | Status: DC | PRN

## 2018-01-08 MED ORDER — LISINOPRIL 5 MG PO TABS
5.0000 mg | ORAL_TABLET | Freq: Every day | ORAL | Status: DC
  Administered 2018-01-08 – 2018-01-09 (×2): 5 mg via ORAL
  Filled 2018-01-08 (×2): qty 1

## 2018-01-08 NOTE — Progress Notes (Signed)
Patients daughter at bedside states Mom didn't need treatment at this time.  Patient sitting up in bed no distress noted, smiling and watching TV.  Advised daughter if patient feels she needs a neb treatment anytime during the night to have RN call RT and she can have one.
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
## 2018-01-08 NOTE — Progress Notes (Signed)
Physical Therapy Session Note  Patient Details  Name: Tasha [name] MRN: [id number] Date of Birth: Feb 04, 1951  Today's Date: 01/08/2018 PT Individual Time: 0830-0900 PT Individual Time Calculation (min): 30 min   Short Term Goals: Week 1:  PT Short Term Goal 1 (Week 1): Pt will perform bed mobility with supervision Assist  PT Short Term Goal 1 - Progress (Week 1): Met PT Short Term Goal 2 (Week 1): Pt will ambulate 44f with CGA and LRAD  PT Short Term Goal 2 - Progress (Week 1): Met PT Short Term Goal 3 (Week 1): WC mobility x 1052fwith CGA  PT Short Term Goal 3 - Progress (Week 1): Met PT Short Term Goal 4 (Week 1): Pt will perform stand pivot transfers with supervision assist and LRAD  PT Short Term Goal 4 - Progress (Week 1): Met  Skilled Therapeutic Interventions/Progress Updates:    Pt received seated in bed, agreeable to PT. No complaints of pain. Bed mobility Mod I. Sit to stand with Supervision to RW. Ambulation 3 x 150 ft with RW and Supervision throughout therapy session. SpO2 99% and HR 91 following ambulation. Ascend/descend 4 stairs with 2 handrails and Supervision, step-to gait pattern. Pt left seated in recliner in room with needs in reach and family present at end of therapy session. Medical interpreter present during therapy session.  Therapy Documentation Precautions:  Precautions Precautions: Fall Precaution Comments: monitor 02 Restrictions Weight Bearing Restrictions: No  See Function Navigator for Current Functional Status.   Therapy/Group: Individual Therapy  TaExcell SeltzerPT, DPT  01/08/2018, 12:32 PM

## 2018-01-08 NOTE — Progress Notes (Signed)
Occupational Therapy Discharge Summary  Patient Details  Name: Tasha [name] MRN: [id number] Date of Birth: [birth year]  Today's Date: 01/08/2018 OT Individual Time: 1000-1115 OT Individual Time Calculation (min): 75 min   1:1 Grad day. Self care retraining at shower level in the ADL apart with the real tub; demonstrating real tub bench transfer, safe functional mobility with RW and  Improved cardiopulmonary endurance. Pt able to maintain O2 sats above 90% throughout session. Discussed with pt and family member safety recommendations for home and DME. Ambulated to and from the gym with supervision with RW. In gym discussed fall recovery and if a fall was to happen. Pt unable to get down to the floor to practice due to knee discomfort and unable to tolerate position. Performed LB stretches in supine on mat for hip flexors, back adductors. Pt's home RW adjusted for proper fit and ambulated back to bed.    Patient has met 8 of 8 long term goals due to improved activity tolerance, improved balance, postural control and ability to compensate for deficits.  Patient to discharge at overall Supervision level.  Patient's care partner is independent to provide the necessary physical and cognitive assistance at discharge.    Reasons goals not met: n/a  Recommendation:  Patient will benefit from ongoing skilled OT services in home health setting to continue to advance functional skills in the area of BADL.  Equipment: 3:1, tub bench and RW  Reasons for discharge: treatment goals met and discharge from hospital  Patient/family agrees with progress made and goals achieved: Yes  OT Discharge Precautions/Restrictions  Precautions Precautions: Fall General   Vital Signs Therapy Vitals Temp: 99.1 F (37.3 C) Temp Source: Oral Pulse Rate: 80 Resp: 17 BP: (!) 126/100 Patient Position (if appropriate): Sitting Oxygen Therapy SpO2: 99 % O2 Device: Room Air Pain  no c/o pain in session   ADL ADL ADL Comments: please see functional navigator for ADL status  Vision Baseline Vision/History: Wears glasses Wears Glasses: At all times Patient Visual Report: No change from baseline Vision Assessment?: No apparent visual deficits Perception  Perception: Within Functional Limits Praxis Praxis: Intact Cognition Overall Cognitive Status: Within Functional Limits for tasks assessed Arousal/Alertness: Awake/alert Orientation Level: Oriented X4 Attention: Selective Selective Attention: Appears intact Awareness: Appears intact Sensation Sensation Light Touch: Appears Intact Proprioception: Appears Intact Coordination Gross Motor Movements are Fluid and Coordinated: Yes Fine Motor Movements are Fluid and Coordinated: Yes Motor  Motor Motor: Within Functional Limits Motor - Discharge Observations: improved overall strength and endurance Mobility  Bed Mobility Bed Mobility: Rolling Right;Supine to Sit;Sit to Supine Rolling Right: Independent with assistive device Rolling Left: Independent with assistive device Supine to Sit: Independent with assistive device Sitting - Scoot to Edge of Bed: Independent with assistive device Transfers Sit to Stand: Independent with assistive device Stand to Sit: Independent with assistive device  Trunk/Postural Assessment  Cervical Assessment Cervical Assessment: Within Functional Limits Thoracic Assessment Thoracic Assessment: Exceptions to WFL(rounded shoulder) Lumbar Assessment Lumbar Assessment: Within Functional Limits Postural Control Postural Control: Within Functional Limits(improved right reactions)  Balance Static Sitting Balance Static Sitting - Level of Assistance: 5: Stand by assistance Dynamic Sitting Balance Dynamic Sitting - Level of Assistance: 5: Stand by assistance Static Standing Balance Static Standing - Level of Assistance: 5: Stand by assistance Dynamic Standing Balance Dynamic Standing - Balance Support:  During functional activity Dynamic Standing - Level of Assistance: 5: Stand by assistance Extremity/Trunk Assessment RUE Assessment RUE Assessment: Within Functional Limits LUE Assessment LUE Assessment: Within  Functional Limits   See Function Navigator for Current Functional Status.  Willeen Cass [facility] 01/08/2018, 1:52 PM

## 2018-01-08 NOTE — Progress Notes (Signed)
Physical Therapy Discharge Summary  Patient Details  Name: Tasha [name] MRN: [id number] Date of Birth: 1951-02-02  Today's Date: 01/08/2018 PT Individual Time: 1310-1405 PT Individual Time Calculation (min): 55 min    Patient has met 10 of 10 long term goals due to improved activity tolerance, improved balance, increased strength, increased range of motion, decreased pain, functional use of  right lower extremity and left lower extremity and improved attention.  Patient to discharge at an ambulatory level Supervision.   Patient's care partner is independent to provide the necessary physical assistance at discharge.  Reasons goals not met: All PT goals met.   Recommendation:  Patient will benefit from ongoing skilled PT services in home health setting to continue to advance safe functional mobility, address ongoing impairments in balance, endurance, strength, safety with ambulation, and minimize fall risk.  Equipment: RW  Reasons for discharge: treatment goals met and discharge from hospital  Patient/family agrees with progress made and goals achieved: Yes   PT treatment:  Pt received sitting in WC and agreeable to PT. PT instructed pt in Grad day assessment to measure progress toward goals. See below for details. PT instructed pt in [place] test. Patient demonstrates increased fall risk as noted by score of  43 /56 on Berg Balance Scale.(increased from 36 on 12/31/17)  (<36= high risk for falls, close to 100%; 37-45 significant >80%; 46-51 moderate >50%; 52-55 lower >25%). Patient returned to room and left sitting in [facility] with call bell in reach and all needs met.      PT Discharge Precautions/Restrictions Precautions Precautions: Fall Vital Signs Therapy Vitals Temp: 99.1 F (37.3 C) Temp Source: Oral Pulse Rate: 80 Resp: 17 BP: (!) 126/100 Patient Position (if appropriate): Sitting Oxygen Therapy SpO2: 99 % O2 Device: Room Air Pain   0/10  Vision/Perception   Perception Perception: Within Functional Limits Praxis Praxis: Intact  Cognition Overall Cognitive Status: Within Functional Limits for tasks assessed Arousal/Alertness: Awake/alert Orientation Level: Oriented X4 Attention: Selective Selective Attention: Appears intact Awareness: Appears intact Sensation Sensation Light Touch: Appears Intact Proprioception: Appears Intact Coordination Gross Motor Movements are Fluid and Coordinated: Yes Fine Motor Movements are Fluid and Coordinated: Yes Motor  Motor Motor: Within Functional Limits Motor - Discharge Observations: improved overall strength and endurance  Mobility Bed Mobility Bed Mobility: Rolling Right;Supine to Sit;Sit to Supine Rolling Right: Independent with assistive device Rolling Left: Independent with assistive device Supine to Sit: Independent with assistive device Sitting - Scoot to Edge of Bed: Independent with assistive device Transfers Transfers: Sit to Stand;Stand to Sit;Stand Pivot Transfers Sit to Stand: Independent with assistive device Stand to Sit: Independent with assistive device Stand Pivot Transfers: Independent with assistive device Stand Pivot Transfer Details (indicate cue type and reason): Car transfer to [employer] with Supervision assist and RW Transfer (Assistive device): Rolling walker Locomotion  Gait Ambulation: Yes Gait Assistance: Set up assist Gait Distance (Feet): 150 Feet Assistive device: Rolling walker Gait Assistance Details: Gait training over unlevel mulched surface x 55f with supervision assist and LRAD  Gait Gait Pattern: Impaired Gait Pattern: Narrow base of support Stairs / Additional Locomotion Stairs: Yes Stairs Assistance: Supervision/Verbal cueing Stair Management Technique: Two rails Number of Stairs: 12 Height of Stairs: 6 Wheelchair Mobility Wheelchair Mobility: No  Trunk/Postural Assessment  Cervical Assessment Cervical Assessment: Within Functional  Limits Thoracic Assessment Thoracic Assessment: Exceptions to WFL(rounded shoulder) Lumbar Assessment Lumbar Assessment: Within Functional Limits Postural Control Postural Control: Within Functional Limits(improved right reactions)  Balance Standardized Balance  Assessment Standardized Balance Assessment: Berg Balance Test Berg Balance Test Sit to Stand: Able to stand  independently using hands Standing Unsupported: Able to stand safely 2 minutes Sitting with Back Unsupported but Feet Supported on Floor or Stool: Able to sit safely and securely 2 minutes Stand to Sit: Controls descent by using hands Transfers: Able to transfer safely, definite need of hands Standing Unsupported with Eyes Closed: Able to stand 10 seconds safely Standing Ubsupported with Feet Together: Able to place feet together independently and stand for 1 minute with supervision From Standing, Reach Forward with Outstretched Arm: Can reach forward >12 cm safely (5") From Standing Position, Pick up Object from Floor: Able to pick up shoe, needs supervision From Standing Position, Turn to Look Behind Over each Shoulder: Looks behind from both sides and weight shifts well Turn 360 Degrees: Able to turn 360 degrees safely but slowly Standing Unsupported, Alternately Place Feet on Step/Stool: Able to complete 4 steps without aid or supervision Standing Unsupported, One Foot in Front: Able to plae foot ahead of the other independently and hold 30 seconds Standing on One Leg: Able to lift leg independently and hold equal to or more than 3 seconds Total Score: 43 Static Sitting Balance Static Sitting - Level of Assistance: 5: Stand by assistance Dynamic Sitting Balance Dynamic Sitting - Level of Assistance: 5: Stand by assistance Static Standing Balance Static Standing - Level of Assistance: 5: Stand by assistance Dynamic Standing Balance Dynamic Standing - Balance Support: During functional activity Dynamic Standing -  Level of Assistance: 5: Stand by assistance Extremity Assessment  RUE Assessment RUE Assessment: Within Functional Limits LUE Assessment LUE Assessment: Within Functional Limits RLE Assessment RLE Assessment: Within Functional Limits General Strength Comments: Grossly 5/5 except hip flexion 4+/5 LLE Assessment LLE Assessment: Exceptions to [facility] General Strength Comments: Grossly 5/5 except hip flexion 4/5   See Function Navigator for Current Functional Status.  Lorie Phenix 01/08/2018, 1:58 PM

## 2018-01-08 NOTE — Progress Notes (Signed)
Patient appears to e resting upon rounding w/o apparent discomfort or complaint, respiration even and steady, coloration adequate,Family member at bedside throughout shift. Sleep chart monitoring continue and indicates that most of shift patient appears to be resting comfortable. No apparent respiratory distress, Refer to assessment data for additional information. Call bell with in reach, bed alarm on.
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
## 2018-01-08 NOTE — Plan of Care (Signed)
Nutrition Education Note  RD consulted for nutrition education regarding diabetes. RD utilized iPad interpreting service for duration of education.  Pt states that she typically eats 3 meals daily. Pt reports liking lettuce, spinach, and consuming small portions of foods. Pt states that she drinks water, soda (1-2 glasses daily), and flavored water.  Breakfast: coffee and bread OR atole (milk, cornstarch, sweetener) Lunch: "different things," beans with pork, chicken, fish, or stead Dinner: tortillas with beans, sour cream, and cheese OR bowl of cereal  Lab Results  Component Value Date   HGBA1C 6.3 (H) 12/31/2017    RD provided "Carbohydrate Counting for People with Diabetes" handout in both English and Spanish from the Academy of [employer]etics. Discussed different food groups and their effects on blood sugar, emphasizing carbohydrate-containing foods. Provided list of carbohydrates and recommended serving sizes of common foods. Encouraged pt to measure portion sizes for the first few days after d/c so that she can learn what 1 serving of carbohydrate foods looks like.  Discussed importance of controlled and consistent carbohydrate intake throughout the day. Provided examples of ways to balance meals/snacks and encouraged intake of high-fiber, whole grain complex carbohydrates. Teach back method used.  Expect good compliance.  Body mass index is 28.41 kg/m. Pt meets criteria for overweight based on current BMI.  Current diet order is Carb Modified, patient is consuming approximately 75% of meals at this time. Labs and medications reviewed. No further nutrition interventions warranted at this time. RD contact information provided. If additional nutrition issues arise, please re-consult RD.   Gaynell Face, MS, RD, LDN Inpatient Clinical Dietitian Pager: [phone] Weekend/After Hours: [phone]

## 2018-01-08 NOTE — Progress Notes (Signed)
Speech Language Pathology Discharge Summary  Patient Details  Name: Tasha [name] MRN: [id number] Date of Birth: 01/10/1951  Today's Date: 01/08/2018 SLP Individual Time: 5456-2563 SLP Individual Time Calculation (min): 26 min   Skilled Therapeutic Interventions:  Pt was seen for skilled ST targeting goals for cognition and dysphagia.  Pt's son and daughter in law were present for first part of today's therapy session.  Pt and pt's family are in agreement that pt is back to baseline for cognition but did request education re: memory compensatory strategies.  SLP discussed keeping a consistent routine, writing things down, keeping things in the same place, and organizing the environment to maximize carryover of daily information.  Pt consumed regular textures and thin liquids with mod I use of swallowing precautions and no overt s/s of aspiration with solids or liquids.  Pt completed 50 repetitions each of IMST and EMST with self perceived effort levels of 2 and 4 out of 10 respectively.  As a result, pt no longer needs to complete RMST training.  Given that pt appears to be back to baseline for cognition and swallowing, no further ST needs are indicated at this time.  Pt is ready for discharge tomorrow.      Patient has met 4 of 4 long term goals.  Patient to discharge at overall Modified Independent level.  Reasons goals not met:     Clinical Impression/Discharge Summary:   Pt has made excellent gains while inpatient and is discharging having met 4 out of 4 short term goals.  Pt is currently mod I for tasks and is consuming regular textures, thin liquids diet with mod I use of swallowing precautions.  Pt/family education is complete at this time.  Pt is discharging home with 24/7 supervision from family.  No further ST needs indicated as pt is at baseline for cognition, communication, and swallowing function.     Care Partner:  Caregiver Able to Provide Assistance: Yes  Type of Caregiver  Assistance: Physical;Cognitive  Recommendation:  None      Equipment: none recommended by SLP    Reasons for discharge: Discharged from hospital   Patient/Family Agrees with Progress Made and Goals Achieved: Yes   Function:  Eating Eating   Modified Consistency Diet: No Eating Assist Level: Swallowing techniques: self managed           Cognition Comprehension Comprehension assist level: Follows basic conversation/direction with no assist  Expression   Expression assist level: Expresses basic needs/ideas: With no assist  Social Interaction Social Interaction assist level: Interacts appropriately with others with medication or extra time (anti-anxiety, antidepressant).  Problem Solving Problem solving assist level: Solves basic problems with no assist  Memory Memory assist level: Recognizes or recalls 90% of the time/requires cueing < 10% of the time   Windell Moulding L 01/08/2018, 10:59 AM

## 2018-01-09 LAB — CBC WITH DIFFERENTIAL/PLATELET
Abs Immature Granulocytes: 0.1 10*3/uL (ref 0.0–0.1)
BASOS ABS: 0 10*3/uL (ref 0.0–0.1)
Basophils Relative: 0 %
EOS ABS: 0.1 10*3/uL (ref 0.0–0.7)
EOS PCT: 1 %
HCT: 27.9 % — ABNORMAL LOW (ref 36.0–46.0)
HEMOGLOBIN: 8.9 g/dL — AB (ref 12.0–15.0)
Immature Granulocytes: 2 %
LYMPHS ABS: 1.6 10*3/uL (ref 0.7–4.0)
LYMPHS PCT: 21 %
MCH: 29 pg (ref 26.0–34.0)
MCHC: 31.9 g/dL (ref 30.0–36.0)
MCV: 90.9 fL (ref 78.0–100.0)
Monocytes Absolute: 0.6 10*3/uL (ref 0.1–1.0)
Monocytes Relative: 7 %
Neutro Abs: 5.1 10*3/uL (ref 1.7–7.7)
Neutrophils Relative %: 69 %
Platelets: 77 10*3/uL — ABNORMAL LOW (ref 150–400)
RBC: 3.07 MIL/uL — ABNORMAL LOW (ref 3.87–5.11)
RDW: 17.6 % — AB (ref 11.5–15.5)
WBC: 7.4 10*3/uL (ref 4.0–10.5)

## 2018-01-09 LAB — COMPREHENSIVE METABOLIC PANEL
ALK PHOS: 36 U/L — AB (ref 38–126)
ALT: 51 U/L — AB (ref 0–44)
AST: 25 U/L (ref 15–41)
Albumin: 2.5 g/dL — ABNORMAL LOW (ref 3.5–5.0)
Anion gap: 9 (ref 5–15)
BUN: 29 mg/dL — AB (ref 8–23)
CO2: 23 mmol/L (ref 22–32)
CREATININE: 0.92 mg/dL (ref 0.44–1.00)
Calcium: 8.5 mg/dL — ABNORMAL LOW (ref 8.9–10.3)
Chloride: 108 mmol/L (ref 98–111)
GFR calc Af Amer: >60 mL/min (ref >60)
Glucose, Bld: 134 mg/dL — ABNORMAL HIGH (ref 70–99)
POTASSIUM: 3.9 mmol/L (ref 3.5–5.1)
Sodium: 140 mmol/L (ref 135–145)
TOTAL PROTEIN: 4.6 g/dL — AB (ref 6.5–8.1)
Total Bilirubin: 0.8 mg/dL (ref 0.3–1.2)

## 2018-01-09 LAB — GLUCOSE, CAPILLARY: Glucose-Capillary: 165 mg/dL — ABNORMAL HIGH (ref 70–99)

## 2018-01-09 LAB — CULTURE, FUNGUS WITHOUT SMEAR

## 2018-01-09 MED ORDER — ALBUTEROL SULFATE HFA 108 (90 BASE) MCG/ACT IN AERS: 2.0000 | INHALATION_SPRAY | Freq: Four times a day (QID) | RESPIRATORY_TRACT | 2 refills | Status: DC | PRN

## 2018-01-09 MED ORDER — PREDNISONE 10 MG PO TABS: ORAL_TABLET | ORAL | 0 refills | Status: DC

## 2018-01-09 MED ORDER — PANTOPRAZOLE SODIUM 40 MG PO TBEC: 40.0000 mg | DELAYED_RELEASE_TABLET | Freq: Every day | ORAL | 0 refills | Status: DC

## 2018-01-09 MED ORDER — DOCUSATE SODIUM 100 MG PO CAPS: 200.0000 mg | ORAL_CAPSULE | Freq: Two times a day (BID) | ORAL | 0 refills | Status: DC

## 2018-01-09 MED ORDER — LISINOPRIL 5 MG PO TABS: 5.0000 mg | ORAL_TABLET | Freq: Every day | ORAL | 0 refills | Status: DC

## 2018-01-09 MED ORDER — THIAMINE HCL 100 MG PO TABS: 100.0000 mg | ORAL_TABLET | Freq: Every day | ORAL | 0 refills | Status: DC

## 2018-01-09 MED ORDER — ACETAMINOPHEN 325 MG PO TABS: 325.0000 mg | ORAL_TABLET | ORAL | Status: DC | PRN

## 2018-01-09 MED ORDER — CARVEDILOL 3.125 MG PO TABS: 3.1250 mg | ORAL_TABLET | Freq: Two times a day (BID) | ORAL | 0 refills | Status: DC

## 2018-01-09 MED ORDER — POTASSIUM CHLORIDE CRYS ER 20 MEQ PO TBCR: 20.0000 meq | EXTENDED_RELEASE_TABLET | Freq: Two times a day (BID) | ORAL | 0 refills | Status: DC

## 2018-01-09 MED ORDER — IPRATROPIUM-ALBUTEROL 20-100 MCG/ACT IN AERS: 1.0000 | INHALATION_SPRAY | Freq: Four times a day (QID) | RESPIRATORY_TRACT | 0 refills | Status: DC | PRN

## 2018-01-09 MED ORDER — SALINE SPRAY 0.65 % NA SOLN: 1.0000 | Freq: Three times a day (TID) | NASAL | 0 refills | Status: DC

## 2018-01-09 MED ORDER — CETIRIZINE HCL 10 MG PO TABS: 10.0000 mg | ORAL_TABLET | Freq: Every day | ORAL | Status: DC | PRN

## 2018-01-09 MED ORDER — MENTHOL 3 MG MT LOZG: 1.0000 | LOZENGE | OROMUCOSAL | Status: DC | PRN

## 2018-01-09 NOTE — Progress Notes (Signed)
Social Work Discharge Note  The overall goal for the admission was met for:   Discharge location: Yes - home with family  Length of Stay: Yes - 10 days  Discharge activity level: Yes - supervision  Home/community participation: Yes  Services provided included: MD, RD, PT, OT, SLP, RN, Pharmacy and SW  Financial Services: Medicare  Follow-up services arranged: Home Health: PT/OT from [facility], DME: rolling walker; 3-in-1; tub transfer bench from [facility] and Patient/Family has no preference for HH/DME agencies  Comments (or additional information): Family education completed and everyone is prepared for pt to come home.  Patient/Family verbalized understanding of follow-up arrangements: Yes  Individual responsible for coordination of the follow-up plan: pt and her family  Confirmed correct DME delivered: Trey Sailors 01/09/2018    Solly Derasmo, Silvestre Mesi

## 2018-01-09 NOTE — Progress Notes (Signed)
Patient discharged to home accompanied by her family.

## 2018-01-09 NOTE — Progress Notes (Signed)
Social Work [name], female   DOB: Jun 20, 1950, 67 y.o.   MRN: [id number]   CSW met with pt and her dtr-in-law with interpreter 01-08-18 to update her on team conference discussion and targeted d/c date of 01-09-18.  Pt/family already aware of d/c date from therapists and are prepared to take pt home.  CSW arranged f/u HH therapies and ordered DME.  Pt is pleased to be going home and does not have any concerns/questions/needs.  CSW will continue to follow and assist as needed.

## 2018-01-09 NOTE — Progress Notes (Signed)
Social Work [name], female   DOB: [birth year], 67 y.o.   MRN: [id number] Spoke wit Pam-PA who wanted top add a HHRN to home health order. Have re-faxed order.

## 2018-01-09 NOTE — Discharge Instructions (Signed)
Inpatient Rehab Discharge Instructions  Gerald Honea Discharge date and time: 01/09/18   Activities/Precautions/ Functional Status: Activity: no lifting, driving, or strenuous exercise for till cleared by MD Diet: Limit sweets/starches.  Wound Care: keep wound clean and dry   Functional status:  ___ No restrictions     ___ Walk up steps independently _X__ 24/7 supervision/assistance   ___ Walk up steps with assistance ___ Intermittent supervision/assistance  ___ Bathe/dress independently ___ Walk with walker     _X__ Bathe/dress with assistance ___ Walk Independently    ___ Shower independently _X__ Walk with assistance    ___ Shower with assistance _X__ No alcohol     ___ Return to work/school ________   COMMUNITY REFERRALS UPON DISCHARGE:   Home Health:   PT     OT     RN  Agency:  [facility] Phone:  [phone] Medical Equipment/Items Ordered: rolling walker; 3-in-1; tub transfer bench  Agency/Supplier:  [facility]        Phone:  [phone]  GENERAL COMMUNITY RESOURCES FOR PATIENT/FAMILY: Support Groups:  [facility] Stroke Support Group                              Meets the second Thursday of each month form 6-7pm (except June, July, and August)                              In the dayroom of [facility], [facility]                              For more information, please call [phone]  Special Instructions: 1. Continue using spirometer/flutter valve four times a day.  2. Pace yourself. Need to get up and walk around the house every hour.     My questions have been answered and I understand these instructions. I will adhere to these goals and the provided educational materials after my discharge from the hospital.  Patient/Caregiver Signature _______________________________ Date __________  Clinician Signature _______________________________________ Date __________  Please bring this  form and your medication list with you to all your follow-up doctor's appointments.

## 2018-01-09 NOTE — Progress Notes (Signed)
[place] PHYSICAL MEDICINE & REHABILITATION     PROGRESS NOTE  Subjective/Complaints:  Patient seen sitting up in bed this morning.  Daughter at bedside who translates.  Patient slept well overnight.  She notes improvement in cough.  ROS: Denies CP, SOB, nausea, vomiting, diarrhea.  Objective: Vital Signs: Blood pressure 137/66, pulse 61, temperature 97.9 F (36.6 C), temperature source Oral, resp. rate 18, weight 68 kg, SpO2 96 %. No results found. Recent Labs    01/09/18 0545  WBC 7.4  HGB 8.9*  HCT 27.9*  PLT 77*   Recent Labs    01/09/18 0545  NA 140  K 3.9  CL 108  GLUCOSE 134*  BUN 29*  CREATININE 0.92  CALCIUM 8.5*   CBG (last 3)  Recent Labs    01/08/18 1628 01/08/18 2139 01/09/18 0635  GLUCAP 139* 142* 165*    Wt Readings from Last 3 Encounters:  01/09/18 68 kg  12/30/17 66.9 kg  12/05/17 68.5 kg    Physical Exam:  BP 137/66 (BP Location: Right Arm)   Pulse 61   Temp 97.9 F (36.6 C) (Oral)   Resp 18   Wt 68 kg   SpO2 96%   BMI 28.34 kg/m  Constitutional: She appearswell-developedand well-nourished.  HENT:normocephalic. Atraumatic Eyes:EOMI. No discharge. Cardiovascular: RRR.  No JVD. Respiratory:Effort normal and breath sounds normal.  RW:ERXVQ sounds are normal. She exhibitsno distension.  Musculoskeletal: No edema or tenderness in extremities Neurological: Dysphonia, improving Motor: Grossly 4+/5 proximal to distal, unchanged Skin: Skin iswarm.  Psychiatric: She has anormal mood and affect. Herbehavior is normal.  Assessment/Plan: 1. Functional deficits secondary to debility which require 3+ hours per day of interdisciplinary therapy in a comprehensive inpatient rehab setting. Physiatrist is providing close team supervision and 24 hour management of active medical problems listed below. Physiatrist and rehab team continue to assess barriers to discharge/monitor patient progress toward functional and medical  goals.  Function:  Bathing Bathing position   Position: Shower  Bathing parts Body parts bathed by patient: Right arm, Left arm, Chest, Abdomen, Front perineal area, Buttocks, Right upper leg, Left upper leg, Right lower leg, Left lower leg, Back Body parts bathed by helper: Back  Bathing assist Assist Level: Supervision or verbal cues      Upper Body Dressing/Undressing Upper body dressing   What is the patient wearing?: Pull over shirt/dress     Pull over shirt/dress - Perfomed by patient: Thread/unthread right sleeve, Thread/unthread left sleeve, Put head through opening, Pull shirt over trunk Pull over shirt/dress - Perfomed by helper: Pull shirt over trunk        Upper body assist Assist Level: Supervision or verbal cues      Lower Body Dressing/Undressing Lower body dressing   What is the patient wearing?: Non-skid slipper socks, Pants     Pants- Performed by patient: Thread/unthread right pants leg, Thread/unthread left pants leg, Pull pants up/down     Non-skid slipper socks- Performed by helper: Don/doff right sock, Don/doff left sock Socks - Performed by patient: Don/doff right sock, Don/doff left sock   Shoes - Performed by patient: Don/doff right shoe Shoes - Performed by helper: Don/doff left shoe, Fasten right, Fasten left          Lower body assist Assist for lower body dressing: Supervision or verbal cues      Toileting Toileting   Toileting steps completed by patient: Adjust clothing prior to toileting, Performs perineal hygiene, Adjust clothing after toileting   Toileting Assistive  Devices: Grab bar or rail  Toileting assist Assist level: Set up/obtain supplies   Transfers Chair/bed transfer   Chair/bed transfer method: Stand pivot Chair/bed transfer assist level: No Help, no cues, assistive device, takes more than a reasonable amount of time Chair/bed transfer assistive device: Walker, Air cabin crew     Max  distance: 142ft Assist level: Supervision or verbal cues   Wheelchair Wheelchair activity did not occur: N/A Type: Manual Max wheelchair distance: 114ft Assist Level: Supervision or verbal cues  Cognition Comprehension Comprehension assist level: Follows basic conversation/direction with no assist  Expression Expression assist level: Expresses basic needs/ideas: With no assist  Social Interaction Social Interaction assist level: Interacts appropriately with others with medication or extra time (anti-anxiety, antidepressant).  Problem Solving Problem solving assist level: Solves basic problems with no assist  Memory Memory assist level: Recognizes or recalls 90% of the time/requires cueing < 10% of the time     Medical Problem List and Plan: 1.Functional and mobility deficitssecondary to debility after multiple medical issues  Cont CIR 2. DVT Prophylaxis/Anticoagulation: Pharmaceutical:Lovenox  HIT negative 3.DJD bilateral knees/Pain Management:tylenol prn with local measures. 4. Mood:LCSW to follow for evaluation and support. 5. Neuropsych: This patientiscapable of making decisions on herown behalf. 6. Skin/Wound Care:routine pressure relief measures 7. Fluids/Electrolytes/Nutrition:Monitor I/O.   D3 nectars, advanced to regular thins on 9/17 8. JQZ:ESPQZRA BP bid. On coreg bid. Prinzide on hold due to hypotension.  Lisinopril 5 daily started on 9/19  May require ambulatory adjustments as outpatient. 9. GERD:Continue Protonix. 10. Episode of A fib with RVR: Due to stress and resolved with amiodarone. Sick euthyroid--will need TSH repeated in 3-4 weeks.Will need 30 day monitor at discharge 11. Hyperlipidemia: Lipitor on hold due to poor intake. 12. Subacute ILD: Slow steroid taper initiated on 9/14 13. Urinary retention: Improving  PVRs relatively unremarkable.  14. Steroid induced hyperglycemia on prediabetes:   Hgb A1C 6.3  Will consider change to Carb  modified diet if remains elevated after steroid taper completed, continue CBGs  Monitor BS ac/hs and use SSI for elevated BS.   Labile on 9/20 16. Pre-renal azotemia: Encourage nectar fluid intake.  Creatinine 0.92 on 9/20 17. Hypokalemia  Potassium 3.9 on 9/20  Continue supplementation 18. Hypoalbuminemia  Supplement initiated on 9/11 19. Transaminitis  ALT elevated on 9/16, improving on 9/20  Continue to monitor 20. Acute blood loss anemia on ? Anemia of chronic disease  Hemoglobin 8.9 on 9/20  Continue to monitor 21. Thrombocytopenia  HIT negative  Platelets 77 on 9/20  Discussed with pharmacy, may consider d/c ASA if continues to trend down -we will review and consider DC before discharge  Continue to monitor 22. Supplemental oxygen dependent  Weaned at present 23.  Epistaxis  Saline spray ordered on 9/17  Resolved 24. Cough  Improving  Duoneb PRN  Will consider tessalon if no improvement  >30 minutes spent in total regarding discharge, educating family, reviewing/adjusting medications, discussing with pharmacy, etc.  LOS (Days) 10 A FACE TO FACE EVALUATION WAS PERFORMED  Ankit Lorie Phenix 01/09/2018 8:02 AM

## 2018-01-09 NOTE — Patient Care Conference (Signed)
Inpatient RehabilitationTeam Conference and Plan of Care Update Date: 01/07/2018   Time: 2:30 PM    Patient Name: Tasha [name]      Medical Record Number: [id number]  Date of Birth: Mar 07, 1951 Sex: Female         Room/Bed: 4M03C/4M03C-01 Payor Info: Payor: MEDICARE / Plan: MEDICARE PART A AND B / Product Type: *No Product type* /    Admitting Diagnosis: Debility  Admit Date/Time:  12/30/2017  3:34 PM Admission Comments: No comment available   Primary Diagnosis:  Debility Principal Problem: Debility  Patient Active Problem List   Diagnosis Date Noted  . Cough   . Epistaxis   . Interstitial lung disease (HCC)   . Hypotension   . Dysphagia   . Thrombocytopenia (HCC)   . Transaminitis   . Hypokalemia   . Prediabetes   . Urinary retention   . History of atrial fibrillation   . Debility 12/30/2017  . Persistent atrial fibrillation (HCC)   . Prerenal azotemia   . Supplemental oxygen dependent   . Metabolic syndrome   . Tachypnea   . Steroid-induced hyperglycemia   . Hypoalbuminemia due to protein-calorie malnutrition (HCC)   . Acute blood loss anemia   . ARDS (adult respiratory distress syndrome) (HCC)   . Barotrauma   . Chest tube in place   . On mechanically assisted ventilation (HCC)   . Atrial fibrillation with rapid ventricular response (HCC)   . Pneumothorax   . Difficult intravenous access   . Encounter for central line placement   . BOOP (bronchiolitis obliterans with organizing pneumonia) (HCC)   . Acute respiratory failure (HCC)   . CAP (community acquired pneumonia)   . Depression 03/12/2016  . BMI 25.0-25.9,adult 02/20/2016  . Allergy-induced asthma 08/10/2014  . Gastroesophageal reflux disease without esophagitis 08/10/2014  . Hyperlipidemia with target LDL less than 100 05/04/2013  . Hypertension 09/03/2012    Expected Discharge Date: Expected Discharge Date: 01/09/18  Team Members Present: Physician leading conference: Dr. Maryla Morrow Social Worker  Present: Amada Jupiter, LCSW Nurse Present: Tennis Must, RN PT Present: Grier Rocher, PT;Rosita Dechalus, PTA OT Present: Perrin Maltese, OT SLP Present: Jackalyn Lombard, SLP PPS Coordinator present : Tora Duck, RN, CRRN     Current Status/Progress Goal Weekly Team Focus  Medical   Functional and mobility deficits secondary to debility after multiple medical issues  Improve mobility, safety, IDL, cough, platelets, epistaxis  See above   Bowel/Bladder   COntinent of bladder and bowel, remain on stool softener LBM 01/06/2018, refusing HS stool softener ( Colace x2 po)   Maintain normal bowel/bladder pattern  QS assessment and prn with prn meds    Swallow/Nutrition/ Hydration   upgraded to regular textures, thin liquids per MBS 01/06/2018   mod I with least restrictive diet   toleration of diet advancement   ADL's   Pt currently supervision for UB selfcare, min guard for LB selfcare sit to stand, min guard for transfers into the shower and the toilet  supervision  selfcare retraining, transfer training, balance retraining, therapeutic exercises,    Mobility    Mod I bed mobility, supervision transfers, ambulation 150' with rolling walker  Mod I transfers with LRAD. Supervision assist with ambulation at household level.   Endurance, mobility, safety education, and gait training.  Communication             Safety/Cognition/ Behavioral Observations  mild memory deficits for new information   mod I   education and carryover of memory  compensatory strategies   Pain   denies pain   < 3  Assess qs and prn , evaluate medication effect , continue to educate family and patient, notify medication as needed    Skin   Skin intact, ecchymotic areas to bilateral arms and abdomen improving,   Remaiin free of skin breakdown and infection  Assess qs and prn     Rehab Goals Patient on target to meet rehab goals: Yes Rehab Goals Revised: none *See Care Plan and progress notes for long and short-term goals.      Barriers to Discharge  Current Status/Progress Possible Resolutions Date Resolved   Physician    Medical stability;New oxygen     See above  Therapies, slow steroid taper, follow labs, Duoneb scheduled      Nursing                  PT                    OT                  SLP                SW                Discharge Planning/Teaching Needs:  Pt plans to go home with her husband and other family members to assist her.  Family education this week.   Team Discussion:  Pt's prednisone has been decreased.  She c/o cough and may nee to restart.  Her O2 sats are okay.  Her platelets are down and MD will monitor labs.  Pt is supervision for UB/LB self care with goals of supervision.  Pt is doing great with OT and her endurance is increasing.  Pt will need HH and tub bench, 3-in-1, and rolling walker.  She has S goals for stairs, transfers.  PT goals are S to mod I.  Pt's diet was upgraded to regular and family education has been completed.  Revisions to Treatment Plan:  none    Continued Need for Acute Rehabilitation Level of Care: The patient requires daily medical management by a physician with specialized training in physical medicine and rehabilitation for the following conditions: Daily direction of a multidisciplinary physical rehabilitation program to ensure safe treatment while eliciting the highest outcome that is of practical value to the patient.: Yes Daily medical management of patient stability for increased activity during participation in an intensive rehabilitation regime.: Yes Daily analysis of laboratory values and/or radiology reports with any subsequent need for medication adjustment of medical intervention for : Pulmonary problems;Diabetes problems;Blood pressure problems;Other   I attest that I was present, lead the team conference, and concur with the assessment and plan of the team.   Samaia Iwata, [place] 01/09/2018, 6:55 PM

## 2018-01-12 ENCOUNTER — Telehealth: Payer: Self-pay | Admitting: *Deleted

## 2018-01-12 ENCOUNTER — Other Ambulatory Visit: Payer: Self-pay

## 2018-01-12 DIAGNOSIS — F329 Major depressive disorder, single episode, unspecified: Secondary | ICD-10-CM | POA: Diagnosis not present

## 2018-01-12 DIAGNOSIS — R1310 Dysphagia, unspecified: Secondary | ICD-10-CM | POA: Diagnosis not present

## 2018-01-12 DIAGNOSIS — K219 Gastro-esophageal reflux disease without esophagitis: Secondary | ICD-10-CM | POA: Diagnosis not present

## 2018-01-12 DIAGNOSIS — I10 Essential (primary) hypertension: Secondary | ICD-10-CM | POA: Diagnosis not present

## 2018-01-12 DIAGNOSIS — J849 Interstitial pulmonary disease, unspecified: Secondary | ICD-10-CM | POA: Diagnosis not present

## 2018-01-12 DIAGNOSIS — E876 Hypokalemia: Secondary | ICD-10-CM | POA: Diagnosis not present

## 2018-01-12 DIAGNOSIS — D638 Anemia in other chronic diseases classified elsewhere: Secondary | ICD-10-CM | POA: Diagnosis not present

## 2018-01-12 DIAGNOSIS — M170 Bilateral primary osteoarthritis of knee: Secondary | ICD-10-CM | POA: Diagnosis not present

## 2018-01-12 DIAGNOSIS — J84116 Cryptogenic organizing pneumonia: Secondary | ICD-10-CM | POA: Diagnosis not present

## 2018-01-12 DIAGNOSIS — T380X5D Adverse effect of glucocorticoids and synthetic analogues, subsequent encounter: Secondary | ICD-10-CM | POA: Diagnosis not present

## 2018-01-12 DIAGNOSIS — I481 Persistent atrial fibrillation: Secondary | ICD-10-CM | POA: Diagnosis not present

## 2018-01-12 DIAGNOSIS — R339 Retention of urine, unspecified: Secondary | ICD-10-CM | POA: Diagnosis not present

## 2018-01-12 DIAGNOSIS — E785 Hyperlipidemia, unspecified: Secondary | ICD-10-CM | POA: Diagnosis not present

## 2018-01-12 DIAGNOSIS — J45998 Other asthma: Secondary | ICD-10-CM | POA: Diagnosis not present

## 2018-01-12 DIAGNOSIS — R7303 Prediabetes: Secondary | ICD-10-CM | POA: Diagnosis not present

## 2018-01-12 DIAGNOSIS — D696 Thrombocytopenia, unspecified: Secondary | ICD-10-CM | POA: Diagnosis not present

## 2018-01-12 NOTE — Patient Outreach (Signed)
[facility]) Care Management  01/12/2018  Tasha [name] 10/07/1950 [id number]     EMMI-PNA RED ON EMMI ALERT Day # 2 Date: 01/11/18 Red Alert Reason: " Been confused? Yes"     Outreach attempt # 1 to patient. Spoke with family listed on DPR and able to speak english. Family voices patient is doing well since return home and currently resting. Patient is spanish speaking and unable to understand automated calls She is not appropriate for continued automated EMMI calls. Reviewed and addressed red alert. Family denies patient having confusion or any other worsening issues. Caregiver was able to confirm hat meds have been picked up and no questions or concerns regarding them. Patient scheduled for PCP follow up appt on 01/16/18.       Plan: RN CM will close case at this time. RN CM will notify [facility] administrative assistant to deactivate EMMI calls.    Enzo Montgomery, RN,BSN,CCM [place] Management Telephonic Care Management Coordinator Direct Phone: [phone] Toll Free: [phone] Fax: [phone]

## 2018-01-12 NOTE — Telephone Encounter (Signed)
Christy called from [facility] to request SN [id number].  Approval given.

## 2018-01-13 ENCOUNTER — Telehealth: Payer: Self-pay

## 2018-01-13 DIAGNOSIS — I480 Paroxysmal atrial fibrillation: Secondary | ICD-10-CM

## 2018-01-13 NOTE — Telephone Encounter (Addendum)
Transitional Care call  Patient name: Tasha [name]) DOB: (11/17/1950) 1. Are you/is patient experiencing any problems since coming home? (NO) a. Are there any questions regarding any aspect of care? (NO) 2. Are there any questions regarding medications administration/dosing? (NO) a. Are meds being taken as prescribed? (NO) b. "Patient should review meds with caller to confirm"  3. Have there been any falls? (NO) 4. Has Home Health been to the house and/or have they contacted you? (YES) a. If not, have you tried to contact them? (NA) b. Can we help you contact them? (NA) 5. Are bowels and bladder emptying properly? (YES) a. Are there any unexpected incontinence issues? (NA) b. If applicable, is patient following bowel/bladder programs? (NA) 6. Any fevers, problems with breathing, unexpected pain? (NO) 7. Are there any skin problems or new areas of breakdown? (NO) 8. Has the patient/family member arranged specialty MD follow up (ie cardiology/neurology/renal/surgical/etc.)?  (YES) a. Can we help arrange? (NA) 9. Does the patient need any other services or support that we can help arrange? (NO) 10. Are caregivers following through as expected in assisting the patient? (YES) Has the patient quit smoking, drinking alcohol, or using drugs as recommended? (NA)  Appointment date/time (01/21/2018 / 9:45am), arrive time (9:15am) and who it is with here (Dr. Posey Pronto)

## 2018-01-15 DIAGNOSIS — J849 Interstitial pulmonary disease, unspecified: Secondary | ICD-10-CM | POA: Diagnosis not present

## 2018-01-15 DIAGNOSIS — I481 Persistent atrial fibrillation: Secondary | ICD-10-CM | POA: Diagnosis not present

## 2018-01-15 DIAGNOSIS — J45998 Other asthma: Secondary | ICD-10-CM | POA: Diagnosis not present

## 2018-01-15 DIAGNOSIS — J84116 Cryptogenic organizing pneumonia: Secondary | ICD-10-CM | POA: Diagnosis not present

## 2018-01-15 DIAGNOSIS — I10 Essential (primary) hypertension: Secondary | ICD-10-CM | POA: Diagnosis not present

## 2018-01-15 DIAGNOSIS — D696 Thrombocytopenia, unspecified: Secondary | ICD-10-CM | POA: Diagnosis not present

## 2018-01-16 ENCOUNTER — Encounter: Payer: Self-pay | Admitting: Family Medicine

## 2018-01-16 ENCOUNTER — Ambulatory Visit (INDEPENDENT_AMBULATORY_CARE_PROVIDER_SITE_OTHER): Payer: Medicare Other | Admitting: Family Medicine

## 2018-01-16 VITALS — BP 156/85 | HR 79 | Temp 98.1°F | Ht 61.0 in | Wt 144.0 lb

## 2018-01-16 DIAGNOSIS — J189 Pneumonia, unspecified organism: Secondary | ICD-10-CM | POA: Diagnosis not present

## 2018-01-16 NOTE — Progress Notes (Signed)
BP (!) 156/85   Pulse 79   Temp 98.1 F (36.7 C) (Oral)   Ht 5\' 1"  (1.549 m)   Wt 144 lb (65.3 kg)   BMI 27.21 kg/m    Subjective:    [name], female    DOB: [birth year], 67 y.o.   MRN: [id number]  HPI: Tasha [name] is a 67 y.o. female presenting on 01/16/2018 for Hospitalization Follow-up (8/27- [facility] - States BS has been running up and down since being in the hospital) and Shortness of Breath (Has improved but still gets sob when walking long distance)   HPI Hospital follow-up for pneumonia and shortness of breath Patient is coming in today for hospital follow-up for pneumonia and shortness of breath.  She was seen on 12/16/2017 and was treated for pneumonia with antibiotics and steroids and since leaving the hospital her shortness of breath has been almost back to baseline.  She still has a little bit of a cough but is mostly nonproductive at this point and she is feeling a lot better.  She has finished the antibiotic.  She says the only thing is been bothering her since leaving the hospital is that her blood sugars have been up a little bit and she is been concerned about that but was also instructed that it could be because of medication.  She is monitoring it closely but have been as high as 210 on the steroids that she was on previously.  Relevant past medical, surgical, family and social history reviewed and updated as indicated. Interim medical history since our last visit reviewed. Allergies and medications reviewed and updated.  Review of Systems  Constitutional: Negative for chills and fever.  Eyes: Negative for visual disturbance.  Respiratory: Negative for cough, chest tightness, shortness of breath and wheezing.   Cardiovascular: Negative for chest pain and leg swelling.  Musculoskeletal: Negative for back pain and gait problem.  Skin: Negative for rash.  Neurological: Negative for light-headedness and headaches.  Psychiatric/Behavioral: Negative for  agitation and behavioral problems.  All other systems reviewed and are negative.   Per HPI unless specifically indicated above   Allergies as of 01/16/2018      Reactions   Homatropine    Pt doesn't remember   Hydromet [hydrocodone-homatropine]    Pt doesn't remember   Amoxicillin Rash      Medication List        Accurate as of 01/16/18 11:44 AM. Always use your most recent med list.          acetaminophen 325 MG tablet Commonly known as:  TYLENOL Take 1-2 tablets (325-650 mg total) by mouth every 4 (four) hours as needed for mild pain.   benzonatate 200 MG capsule Commonly known as:  TESSALON Take 1 capsule (200 mg total) by mouth 2 (two) times daily as needed for cough.   carvedilol 3.125 MG tablet Commonly known as:  COREG Take 1 tablet (3.125 mg total) by mouth 2 (two) times daily with a meal.   cetirizine 10 MG tablet Commonly known as:  ZYRTEC Take 1 tablet (10 mg total) by mouth daily as needed for allergies.   docusate sodium 100 MG capsule Commonly known as:  COLACE Take 2 capsules (200 mg total) by mouth 2 (two) times daily. For constipation   Ipratropium-Albuterol 20-100 MCG/ACT Aers respimat Commonly known as:  COMBIVENT Inhale 1 puff into the lungs every 6 (six) hours as needed for wheezing.   lisinopril 5 MG tablet Commonly known as:  PRINIVIL,ZESTRIL Take 1 tablet (5 mg total) by mouth daily.   pantoprazole 40 MG tablet Commonly known as:  PROTONIX Take 1 tablet (40 mg total) by mouth daily.   potassium chloride SA 20 MEQ tablet Commonly known as:  K-DUR,KLOR-CON Take 1 tablet (20 mEq total) by mouth 2 (two) times daily.   predniSONE 10 MG tablet Commonly known as:  DELTASONE Take 3 pills daily till 9/23. Then decrease to 2 pills daily 9/24-9/28. Starting 9/29 decrease to one pill daily till gone.   sodium chloride 0.65 % Soln nasal spray Commonly known as:  OCEAN Place 1 spray into both nostrils 4 (four) times daily -  with meals and at  bedtime.   thiamine 100 MG tablet Take 1 tablet (100 mg total) by mouth daily.          Objective:    BP (!) 156/85   Pulse 79   Temp 98.1 F (36.7 C) (Oral)   Ht 5\' 1"  (1.549 m)   Wt 144 lb (65.3 kg)   BMI 27.21 kg/m   Wt Readings from Last 3 Encounters:  01/16/18 144 lb (65.3 kg)  01/09/18 150 lb (68 kg)  12/30/17 147 lb 7.8 oz (66.9 kg)    Physical Exam  Constitutional: She is oriented to person, place, and time. She appears well-developed and well-nourished. No distress.  HENT:  Right Ear: Tympanic membrane, external ear and ear canal normal.  Left Ear: Tympanic membrane, external ear and ear canal normal.  Nose: No mucosal edema or rhinorrhea. No epistaxis. Right sinus exhibits no maxillary sinus tenderness and no frontal sinus tenderness. Left sinus exhibits no maxillary sinus tenderness and no frontal sinus tenderness.  Mouth/Throat: Uvula is midline and mucous membranes are normal. No oropharyngeal exudate, posterior oropharyngeal edema, posterior oropharyngeal erythema or tonsillar abscesses.  Eyes: Conjunctivae and EOM are normal.  Cardiovascular: Normal rate, regular rhythm, normal heart sounds and intact distal pulses.  No murmur heard. Pulmonary/Chest: Effort normal and breath sounds normal. No respiratory distress. She has no wheezes.  Musculoskeletal: Normal range of motion. She exhibits no edema or tenderness.  Neurological: She is alert and oriented to person, place, and time. Coordination normal.  Skin: Skin is warm and dry. No rash noted. She is not diaphoretic.  Psychiatric: She has a normal mood and affect. Her behavior is normal.  Vitals reviewed.       Assessment & Plan:   Problem List Items Addressed This Visit    None    Visit Diagnoses    Pneumonia due to infectious organism, unspecified laterality, unspecified part of lung    -  Primary   Pneumonitis         Patient had what seems like pneumonia pneumonitis, she was in the hospital from  8 27-9 10.  She says she is doing a lot better now except her sugars are up because of the steroids that she was on.  She has an appointment with pulmonology this coming Wednesday in 5 days.  She says she is feeling much better  Follow up plan: Return if symptoms worsen or fail to improve.  Counseling provided for all of the vaccine components No orders of the defined types were placed in this encounter.   Caryl Pina, MD [facility] 01/16/2018, 11:44 AM

## 2018-01-19 DIAGNOSIS — J84116 Cryptogenic organizing pneumonia: Secondary | ICD-10-CM | POA: Diagnosis not present

## 2018-01-19 DIAGNOSIS — I481 Persistent atrial fibrillation: Secondary | ICD-10-CM | POA: Diagnosis not present

## 2018-01-19 DIAGNOSIS — D696 Thrombocytopenia, unspecified: Secondary | ICD-10-CM | POA: Diagnosis not present

## 2018-01-19 DIAGNOSIS — I10 Essential (primary) hypertension: Secondary | ICD-10-CM | POA: Diagnosis not present

## 2018-01-19 DIAGNOSIS — J45998 Other asthma: Secondary | ICD-10-CM | POA: Diagnosis not present

## 2018-01-19 DIAGNOSIS — J849 Interstitial pulmonary disease, unspecified: Secondary | ICD-10-CM | POA: Diagnosis not present

## 2018-01-20 ENCOUNTER — Telehealth: Payer: Self-pay | Admitting: *Deleted

## 2018-01-20 DIAGNOSIS — I10 Essential (primary) hypertension: Secondary | ICD-10-CM | POA: Diagnosis not present

## 2018-01-20 DIAGNOSIS — D696 Thrombocytopenia, unspecified: Secondary | ICD-10-CM | POA: Diagnosis not present

## 2018-01-20 DIAGNOSIS — I481 Persistent atrial fibrillation: Secondary | ICD-10-CM | POA: Diagnosis not present

## 2018-01-20 DIAGNOSIS — J849 Interstitial pulmonary disease, unspecified: Secondary | ICD-10-CM | POA: Diagnosis not present

## 2018-01-20 DIAGNOSIS — J84116 Cryptogenic organizing pneumonia: Secondary | ICD-10-CM | POA: Diagnosis not present

## 2018-01-20 DIAGNOSIS — J45998 Other asthma: Secondary | ICD-10-CM | POA: Diagnosis not present

## 2018-01-20 NOTE — Telephone Encounter (Signed)
Ronalee Belts PT [facility] called for POC 2wk3. Approval given.

## 2018-01-21 ENCOUNTER — Encounter: Payer: Medicare Other | Attending: Physical Medicine & Rehabilitation | Admitting: Physical Medicine & Rehabilitation

## 2018-01-21 ENCOUNTER — Other Ambulatory Visit: Payer: Self-pay

## 2018-01-21 ENCOUNTER — Encounter: Payer: Self-pay | Admitting: Physical Medicine & Rehabilitation

## 2018-01-21 VITALS — BP 135/79 | HR 81 | Ht 61.0 in | Wt 143.2 lb

## 2018-01-21 DIAGNOSIS — E876 Hypokalemia: Secondary | ICD-10-CM | POA: Diagnosis not present

## 2018-01-21 DIAGNOSIS — E8881 Metabolic syndrome: Secondary | ICD-10-CM | POA: Insufficient documentation

## 2018-01-21 DIAGNOSIS — D696 Thrombocytopenia, unspecified: Secondary | ICD-10-CM | POA: Insufficient documentation

## 2018-01-21 DIAGNOSIS — R740 Nonspecific elevation of levels of transaminase and lactic acid dehydrogenase [LDH]: Secondary | ICD-10-CM | POA: Insufficient documentation

## 2018-01-21 DIAGNOSIS — I481 Persistent atrial fibrillation: Secondary | ICD-10-CM | POA: Diagnosis not present

## 2018-01-21 DIAGNOSIS — I4891 Unspecified atrial fibrillation: Secondary | ICD-10-CM | POA: Insufficient documentation

## 2018-01-21 DIAGNOSIS — I169 Hypertensive crisis, unspecified: Secondary | ICD-10-CM | POA: Insufficient documentation

## 2018-01-21 DIAGNOSIS — E0781 Sick-euthyroid syndrome: Secondary | ICD-10-CM | POA: Diagnosis not present

## 2018-01-21 DIAGNOSIS — R7401 Elevation of levels of liver transaminase levels: Secondary | ICD-10-CM

## 2018-01-21 DIAGNOSIS — J8489 Other specified interstitial pulmonary diseases: Secondary | ICD-10-CM | POA: Diagnosis not present

## 2018-01-21 DIAGNOSIS — E785 Hyperlipidemia, unspecified: Secondary | ICD-10-CM | POA: Insufficient documentation

## 2018-01-21 DIAGNOSIS — J849 Interstitial pulmonary disease, unspecified: Secondary | ICD-10-CM | POA: Diagnosis not present

## 2018-01-21 DIAGNOSIS — I10 Essential (primary) hypertension: Secondary | ICD-10-CM | POA: Diagnosis not present

## 2018-01-21 DIAGNOSIS — R5381 Other malaise: Secondary | ICD-10-CM | POA: Diagnosis not present

## 2018-01-21 DIAGNOSIS — D62 Acute posthemorrhagic anemia: Secondary | ICD-10-CM | POA: Insufficient documentation

## 2018-01-21 DIAGNOSIS — I480 Paroxysmal atrial fibrillation: Secondary | ICD-10-CM

## 2018-01-21 DIAGNOSIS — J84116 Cryptogenic organizing pneumonia: Secondary | ICD-10-CM | POA: Diagnosis not present

## 2018-01-21 DIAGNOSIS — J45998 Other asthma: Secondary | ICD-10-CM | POA: Diagnosis not present

## 2018-01-21 DIAGNOSIS — R269 Unspecified abnormalities of gait and mobility: Secondary | ICD-10-CM | POA: Diagnosis not present

## 2018-01-21 NOTE — Progress Notes (Signed)
Subjective:    [name], female    DOB: February 12, 1951, 67 y.o.   MRN: [id number]  HPI 67 year old female with history of hypertension, metabolic syndrome, presents for transitional care management after receiving CIR for BOOP.   Admit date: 12/30/2017 Discharge date: 01/09/2018  Daughter present, who provides history. At discharge, she was instructed to follow up with Cards, which she has not done.  She sees [employer]. She is using her IS.  She saw PCP.  No lab work was ordered. No issues with swallowing.  BP is controlled. She is tolerating steroid wean.  She is a minimal cough. Denies falls. She last complains of intermittent, sporadic b/l hand tremors lasting a few seconds.  Therapies: 2/week DME: Toilet seat Mobility: Walker at all times  Pain Inventory Average Pain 0 Pain Right Now 0 My pain is no pain  In the last 24 hours, has pain interfered with the following? General activity 0 Relation with others 0 Enjoyment of life 0 What TIME of day is your pain at its worst? no pain Sleep (in general) Good  Pain is worse with: no pain Pain improves with: no pain Relief from Meds: no pain  Mobility walk with assistance use a walker ability to climb steps?  yes do you drive?  no  Function retired  Neuro/Psych weakness  Prior Studies Any changes since last visit?  no  Physicians involved in your care Primary care Dr Jenny Reichmann Dettinger   Family History  Problem Relation Age of Onset  . Cancer Father   . Healthy Brother   . Healthy Daughter   . Healthy Brother    Social History   Socioeconomic History  . Marital status: Married    Spouse name: Not on file  . Number of children: Not on file  . Years of education: Not on file  . Highest education level: Not on file  Occupational History  . Not on file  Social Needs  . Financial resource strain: Not hard at all  . Food insecurity:    Worry: Never true    Inability: Never true  .  Transportation needs:    Medical: No    Non-medical: No  Tobacco Use  . Smoking status: Never Smoker  . Smokeless tobacco: Never Used  Substance and Sexual Activity  . Alcohol use: No  . Drug use: No  . Sexual activity: Not Currently    Birth control/protection: Post-menopausal  Lifestyle  . Physical activity:    Days per week: 7 days    Minutes per session: 30 min  . Stress: Only a little  Relationships  . Social connections:    Talks on phone: Never    Gets together: Never    Attends religious service: More than 4 times per year    Active member of club or organization: Yes    Attends meetings of clubs or organizations: More than 4 times per year    Relationship status: Married  Other Topics Concern  . Not on file  Social History Narrative  . Not on file   Past Surgical History:  Procedure Laterality Date  . CESAREAN SECTION     Past Medical History:  Diagnosis Date  . Acute respiratory failure ([place])   . CAP (community acquired pneumonia)   . Hyperlipidemia   . Hypertension   . Metabolic syndrome    BP 329/51   Pulse 81   Ht 5\' 1"  (1.549 m)   Wt 143 lb  3.2 oz (65 kg)   SpO2 97%   BMI 27.06 kg/m   Opioid Risk Score:   Fall Risk Score:  `1  Depression screen PHQ 2/9  Depression screen [facility] 2/9 01/21/2018 01/16/2018 12/05/2017 12/01/2017 11/14/2017 06/18/2017 06/17/2017  Decreased Interest 0 0 0 2 0 0 0  Down, Depressed, Hopeless 0 0 0 2 0 0 0  PHQ - 2 Score 0 0 0 4 0 0 0  Altered sleeping - - - 3 - - -  Tired, decreased energy - - - 3 - - -  Change in appetite - - - 3 - - -  Feeling bad or failure about yourself  - - - 2 - - -  Trouble concentrating - - - 3 - - -  Moving slowly or fidgety/restless - - - 2 - - -  Suicidal thoughts - - - 1 - - -  PHQ-9 Score - - - 21 - - -  Difficult doing work/chores - - - - - - -   Review of Systems  Constitutional: Negative.   HENT: Negative.   Eyes: Negative.   Respiratory: Positive for cough. Negative for shortness  of breath.   Cardiovascular: Negative.   Gastrointestinal: Negative.   Endocrine: Negative.   Genitourinary: Negative.   Musculoskeletal: Positive for gait problem.  Skin: Negative.   Allergic/Immunologic: Negative.   Neurological: Positive for weakness.  Hematological: Negative.   Psychiatric/Behavioral: Negative.   All other systems reviewed and are negative.      Objective:   Physical Exam Constitutional: She appears well-developed and well-nourished.  HENT: normocephalic. Atraumatic Eyes: EOMI. No discharge. Cardiovascular: RRR. No JVD. Respiratory: Effort normal and breath sounds normal.  GI: Bowel sounds are normal. She exhibits no distension.  Musculoskeletal: No edema or tenderness in extremities Neurological:  Dysphonia, improving Motor: Grossly 4+/5 proximal to distal Skin: Skin is warm.  Psychiatric: She has a normal mood and affect. Her behavior is normal.    Assessment & Plan:  67 year old female with history of hypertension, metabolic syndrome, presents for transitional care management after receiving CIR for BOOP.   1.  Functional and mobility deficits secondary to debility after multiple medical issues  Cont therapies  2. HTN:   Cont meds  Controlled today  3. A fib with RVR:   Due to stress and resolved with amiodarone.   Sick euthyroid, will order TSH   Will need Cards follow up for 30 day monitor   4. Subacute ILD:   Cont slow steroid taper   5. Hypokalemia  Continue supplementation  Will order labs  6. Transaminitis            Will order labs  7. Gait abnormality  Cont therapies  Cont walker for safety  7. Acute blood loss anemia on ? Anemia of chronic disease  Labs ordered  8. Thrombocytopenia  Will order labs

## 2018-01-22 ENCOUNTER — Telehealth: Payer: Self-pay | Admitting: Family Medicine

## 2018-01-22 ENCOUNTER — Encounter: Payer: Self-pay | Admitting: Primary Care

## 2018-01-22 ENCOUNTER — Ambulatory Visit (INDEPENDENT_AMBULATORY_CARE_PROVIDER_SITE_OTHER): Payer: Medicare Other | Admitting: Primary Care

## 2018-01-22 ENCOUNTER — Ambulatory Visit (INDEPENDENT_AMBULATORY_CARE_PROVIDER_SITE_OTHER)
Admission: RE | Admit: 2018-01-22 | Discharge: 2018-01-22 | Disposition: A | Discharge status: Home / Self Care | Payer: Medicare Other | Source: Ambulatory Visit | Attending: Primary Care | Admitting: Primary Care

## 2018-01-22 VITALS — BP 142/88 | HR 64 | Temp 98.8°F | Ht 61.0 in | Wt 142.2 lb

## 2018-01-22 DIAGNOSIS — I4891 Unspecified atrial fibrillation: Secondary | ICD-10-CM

## 2018-01-22 DIAGNOSIS — J8489 Other specified interstitial pulmonary diseases: Secondary | ICD-10-CM

## 2018-01-22 DIAGNOSIS — I481 Persistent atrial fibrillation: Secondary | ICD-10-CM | POA: Diagnosis not present

## 2018-01-22 DIAGNOSIS — J45998 Other asthma: Secondary | ICD-10-CM | POA: Diagnosis not present

## 2018-01-22 DIAGNOSIS — I10 Essential (primary) hypertension: Secondary | ICD-10-CM

## 2018-01-22 DIAGNOSIS — J849 Interstitial pulmonary disease, unspecified: Secondary | ICD-10-CM | POA: Diagnosis not present

## 2018-01-22 DIAGNOSIS — J84116 Cryptogenic organizing pneumonia: Secondary | ICD-10-CM | POA: Diagnosis not present

## 2018-01-22 DIAGNOSIS — D696 Thrombocytopenia, unspecified: Secondary | ICD-10-CM | POA: Diagnosis not present

## 2018-01-22 DIAGNOSIS — J189 Pneumonia, unspecified organism: Secondary | ICD-10-CM | POA: Diagnosis not present

## 2018-01-22 LAB — CBC WITH DIFFERENTIAL/PLATELET
BASOS: 0 %
Basophils Absolute: 0 10*3/uL (ref 0.0–0.2)
EOS (ABSOLUTE): 0 10*3/uL (ref 0.0–0.4)
Eos: 0 %
HEMATOCRIT: 32.9 % — AB (ref 34.0–46.6)
HEMOGLOBIN: 11 g/dL — AB (ref 11.1–15.9)
IMMATURE GRANS (ABS): 0.1 10*3/uL (ref 0.0–0.1)
IMMATURE GRANULOCYTES: 2 %
LYMPHS: 24 %
Lymphocytes Absolute: 1.7 10*3/uL (ref 0.7–3.1)
MCH: 28.8 pg (ref 26.6–33.0)
MCHC: 33.4 g/dL (ref 31.5–35.7)
MCV: 86 fL (ref 79–97)
MONOCYTES: 5 %
Monocytes Absolute: 0.3 10*3/uL (ref 0.1–0.9)
NEUTROS ABS: 4.9 10*3/uL (ref 1.4–7.0)
NEUTROS PCT: 69 %
PLATELETS: 139 10*3/uL — AB (ref 150–450)
RBC: 3.82 x10E6/uL (ref 3.77–5.28)
RDW: 17.2 % — ABNORMAL HIGH (ref 12.3–15.4)
WBC: 7 10*3/uL (ref 3.4–10.8)

## 2018-01-22 LAB — COMPREHENSIVE METABOLIC PANEL
A/G RATIO: 1.9 (ref 1.2–2.2)
ALT: 26 IU/L (ref 0–32)
AST: 17 IU/L (ref 0–40)
Albumin: 3.9 g/dL (ref 3.6–4.8)
Alkaline Phosphatase: 44 IU/L (ref 39–117)
BUN/Creatinine Ratio: 21 (ref 12–28)
BUN: 18 mg/dL (ref 8–27)
Bilirubin Total: 0.4 mg/dL (ref 0.0–1.2)
CO2: 23 mmol/L (ref 20–29)
Calcium: 9.2 mg/dL (ref 8.7–10.3)
Chloride: 104 mmol/L (ref 96–106)
Creatinine, Ser: 0.87 mg/dL (ref 0.57–1.00)
GFR, EST AFRICAN AMERICAN: 80 mL/min/{1.73_m2} (ref >59)
GFR, EST NON AFRICAN AMERICAN: 69 mL/min/{1.73_m2} (ref >59)
Globulin, Total: 2.1 g/dL (ref 1.5–4.5)
Glucose: 119 mg/dL — ABNORMAL HIGH (ref 65–99)
POTASSIUM: 4.7 mmol/L (ref 3.5–5.2)
Sodium: 142 mmol/L (ref 134–144)
TOTAL PROTEIN: 6 g/dL (ref 6.0–8.5)

## 2018-01-22 LAB — TSH: TSH: 7.36 u[IU]/mL — ABNORMAL HIGH (ref 0.450–4.500)

## 2018-01-22 NOTE — Patient Instructions (Addendum)
  Follow up with primary care regarding blood glucose and Thyroid Also monitor BP, today was slightly elevated at 150/90  Check Chest xray today   FU in 4-6 weeks with Dr. Chase Caller in ILD clinic   Return if you develop shortness of breath, chest discomfort, cough or fever

## 2018-01-22 NOTE — Progress Notes (Signed)
@Patient  ID: Tasha [name], female    DOB: 04-Feb-1951, 67 y.o.   MRN: [id number]  Chief Complaint  Patient presents with  . Hospitalization Follow-up    states her breathing has been better since leaving the hospital    Referring provider: Dettinger, Fransisca Kaufmann, MD  HPI: 67 year old female, never smoked. PMH  Hypertension, metabolic syndrome, thrombocytopenia, prediabetes, interstitial lung disease, PAF. Not previously followed by [facility] pulmonary care. Admitted to [facility] from outside hospital on 08/27 with acute hypoxic respiratory failure in setting of bilateral pulmonary infiltrates. Work up indicated [place]. Dx subacute ILD vs pneumonia thought to be from exposure to antifreeze leak in patients car. Requiring intubation from 08/30-09/05, chest tube placed due to pneumothorax. [facility]/10- 09/20, required min assistance with mobility and basic self-care tasks. Speech therapy worked on dysphagia, able to tolerate regular diet thin liquids without signs and symptoms of aspiration.   01/23/2018 Patient presents today for hospital follow-up visit. Accompanied by her daughter who also provides translation. She is feeling a lot better, no breathing issues. Finished oral steroid today. BS 119. CBC improving. Eating and drinking ok. No difficulties swallowing and denies aspiration. Ambulting with walker. Denies fall, sob, wheezing, cough, fever or chest pain.    Allergies  Allergen Reactions  . Homatropine     Pt doesn't remember  . Hydromet [Hydrocodone-Homatropine]     Pt doesn't remember  . Amoxicillin Rash    Immunization History  Administered Date(s) Administered  . Influenza, High Dose Seasonal PF 03/11/2017  . Influenza,inj,Quad PF,6+ Mos 02/04/2014, 02/09/2015, 02/20/2016  . Pneumococcal Conjugate-13 08/10/2015  . Pneumococcal Polysaccharide-23 06/17/2017, 12/21/2017    Past Medical History:  Diagnosis Date  . Acute respiratory failure  ([place])   . CAP (community acquired pneumonia)   . Hyperlipidemia   . Hypertension   . Metabolic syndrome     Tobacco History: Social History   Tobacco Use  Smoking Status Never Smoker  Smokeless Tobacco Never Used   Counseling given: Not Answered   Outpatient Medications Prior to Visit  Medication Sig Dispense Refill  . acetaminophen (TYLENOL) 325 MG tablet Take 1-2 tablets (325-650 mg total) by mouth every 4 (four) hours as needed for mild pain.    . carvedilol (COREG) 3.125 MG tablet Take 1 tablet (3.125 mg total) by mouth 2 (two) times daily with a meal. 60 tablet 0  . cetirizine (ZYRTEC) 10 MG tablet Take 1 tablet (10 mg total) by mouth daily as needed for allergies.    Marland Kitchen docusate sodium (COLACE) 100 MG capsule Take 2 capsules (200 mg total) by mouth 2 (two) times daily. For constipation 120 capsule 0  . Ipratropium-Albuterol (COMBIVENT RESPIMAT) 20-100 MCG/ACT AERS respimat Inhale 1 puff into the lungs every 6 (six) hours as needed for wheezing. 1 Inhaler 0  . lisinopril (PRINIVIL,ZESTRIL) 5 MG tablet Take 1 tablet (5 mg total) by mouth daily. 30 tablet 0  . pantoprazole (PROTONIX) 40 MG tablet Take 1 tablet (40 mg total) by mouth daily. 30 tablet 0  . potassium chloride SA (K-DUR,KLOR-CON) 20 MEQ tablet Take 1 tablet (20 mEq total) by mouth 2 (two) times daily. 60 tablet 0  . sodium chloride (OCEAN) 0.65 % SOLN nasal spray Place 1 spray into both nostrils 4 (four) times daily -  with meals and at bedtime.  0  . thiamine 100 MG tablet Take 1 tablet (100 mg total) by mouth daily. 30 tablet 0   No facility-administered medications  prior to visit.     Review of Systems  Review of Systems  Constitutional: Negative.   HENT: Negative.   Respiratory: Negative.   Cardiovascular: Negative.     Physical Exam  BP (!) 142/88   Pulse 64   Temp 98.8 F (37.1 C) (Oral)   Ht 5\' 1"  (1.549 m)   Wt 142 lb 3.2 oz (64.5 kg)   SpO2 96%   BMI 26.87 kg/m  Physical Exam    Constitutional: She is oriented to person, place, and time. She appears well-developed and well-nourished.  HENT:  Head: Normocephalic and atraumatic.  Eyes: Pupils are equal, round, and reactive to light. EOM are normal.  Neck: Normal range of motion. Neck supple.  Cardiovascular: Normal rate, regular rhythm and normal heart sounds.  No murmur heard. Pulmonary/Chest: Effort normal and breath sounds normal. No respiratory distress. She has no wheezes.  CTA  Abdominal: Soft. Bowel sounds are normal. There is no tenderness.  Neurological: She is alert and oriented to person, place, and time.  Skin: Skin is warm and dry. No rash noted. No erythema.  Psychiatric: She has a normal mood and affect. Her behavior is normal. Judgment normal.     Lab Results:  CBC    Component Value Date/Time   WBC 7.0 01/21/2018 1248   WBC 7.4 01/09/2018 0545   RBC 3.82 01/21/2018 1248   RBC 3.07 (L) 01/09/2018 0545   HGB 11.0 (L) 01/21/2018 1248   HCT 32.9 (L) 01/21/2018 1248   PLT 139 (L) 01/21/2018 1248   MCV 86 01/21/2018 1248   MCH 28.8 01/21/2018 1248   MCH 29.0 01/09/2018 0545   MCHC 33.4 01/21/2018 1248   MCHC 31.9 01/09/2018 0545   RDW 17.2 (H) 01/21/2018 1248   LYMPHSABS 1.7 01/21/2018 1248   MONOABS 0.6 01/09/2018 0545   EOSABS 0.0 01/21/2018 1248   BASOSABS 0.0 01/21/2018 1248    BMET    Component Value Date/Time   NA 142 01/21/2018 1248   K 4.7 01/21/2018 1248   CL 104 01/21/2018 1248   CO2 23 01/21/2018 1248   GLUCOSE 119 (H) 01/21/2018 1248   GLUCOSE 134 (H) 01/09/2018 0545   BUN 18 01/21/2018 1248   CREATININE 0.87 01/21/2018 1248   CREATININE 0.70 09/23/2012 1335   CALCIUM 9.2 01/21/2018 1248   GFRNONAA 69 01/21/2018 1248   GFRNONAA >89 09/23/2012 1335   GFRAA 80 01/21/2018 1248   GFRAA >89 09/23/2012 1335    BNP    Component Value Date/Time   BNP 77.7 12/16/2017 1924    ProBNP No results found for: PROBNP  Imaging: Dg Chest 2 View  Result Date:  01/22/2018 CLINICAL DATA:  Follow-up recent episode of pneumonia. Known bronchiolitis obliterans EXAM: CHEST - 2 VIEW COMPARISON:  Portable chest x-ray of December 27, 2017 FINDINGS: The lungs are well-expanded. The interstitial markings are coarse but have improved since the previous study. Bibasilar infiltrates have resolved. The heart is top-normal in size but stable. The pulmonary vascularity is not engorged. There is calcification in the wall of the aortic arch. The trachea is midline. There is multilevel degenerative disc disease of the thoracic spine. IMPRESSION: Interval resolution of bibasilar pneumonia. Chronic underlying interstitial lung disease is stable. Thoracic aortic atherosclerosis. Electronically Signed   By: David  Martinique M.D.   On: 01/22/2018 11:51   Dg Chest Port 1 View  Result Date: 12/27/2017 CLINICAL DATA:  Respiratory failure. EXAM: PORTABLE CHEST 1 VIEW COMPARISON:  Chest x-ray from yesterday. FINDINGS: Unchanged  left subclavian central venous catheter with tip in the distal SVC. Unchanged feeding tube entering the stomach with the tip below the field of view. Unchanged right sided pigtail chest tube. Stable cardiomediastinal silhouette unchanged small bilateral pleural effusions and bibasilar atelectasis. No pneumothorax. No acute osseous abnormality. Decreasing subcutaneous emphysema. IMPRESSION: Unchanged bibasilar atelectasis and small bilateral pleural effusions. Electronically Signed   By: Titus Dubin M.D.   On: 12/27/2017 07:36   Dg Chest Port 1 View  Result Date: 12/26/2017 CLINICAL DATA:  Respiratory failure, chest tube on the right for treatment of a pneumothorax. EXAM: PORTABLE CHEST 1 VIEW COMPARISON:  Portable chest x-ray of December 25, 2017 FINDINGS: The degree of subcutaneous emphysema has decreased. No residual pneumothorax is observed. The interstitial markings are both lungs are coarse greatest at the bases. The retrosternal soft tissues remain increased on the  left. The cardiac silhouette is enlarged. The pulmonary vascularity is not clearly engorged. There is calcification in the wall of the aortic arch. The left subclavian venous catheter tip projects over the midportion of the SVC. The feeding tube tip projects below the inferior margin of the image. IMPRESSION: No residual right-sided pneumothorax. The amount of subcutaneous emphysema bilaterally has decreased. Persistent coarse lung markings at both bases consistent with atelectasis or pneumonia. Cardiomegaly without pulmonary vascular congestion. The support tubes are in reasonable position. Thoracic aortic atherosclerosis. Electronically Signed   By: David  Martinique M.D.   On: 12/26/2017 11:29   Dg Chest Port 1 View  Result Date: 12/25/2017 CLINICAL DATA:  Endotracheal tube. EXAM: PORTABLE CHEST 1 VIEW COMPARISON:  Chest x-rays dated 12/24/2017 and 12/23/2017. FINDINGS: Heart size and mediastinal contours are stable. Endotracheal tube is well positioned with tip just above the level of the carina. LEFT-sided central line appears well positioned with tip at the level of the mid/lower SVC. Enteric tube passes below the diaphragm. RIGHT-sided chest tube is stable in position. Probable mild bibasilar atelectasis, stable. No new lung findings. No pneumothorax seen. Stable subcutaneous emphysema overlying the chest walls. IMPRESSION: Stable chest x-ray. Probable mild bibasilar atelectasis. Support apparatus, including endotracheal tube, are stable in position. Electronically Signed   By: Franki Cabot M.D.   On: 12/25/2017 10:16   Dg Swallowing Func-speech Pathology  Result Date: 01/06/2018 Objective Swallowing Evaluation: Type of Study: MBS-Modified Barium Swallow Study  Patient Details Name: Finleigh Cheong MRN: [id number] Date of Birth: 1951-01-23 Today's Date: 01/06/2018 Time: SLP Start Time (ACUTE ONLY): 1040 -SLP Stop Time (ACUTE ONLY): 1105 SLP Time Calculation (min) (ACUTE ONLY): 25 min Past Medical History:  Past Medical History: Diagnosis Date . Acute respiratory failure ([place])  . CAP (community acquired pneumonia)  . Hyperlipidemia  . Hypertension  . Metabolic syndrome  Past Surgical History: Past Surgical History: Procedure Laterality Date . CESAREAN SECTION   HPI: 67 year old female patient admitted to [facility] 8/27 w/acute hypoxic respiratory failure in setting of bilateral pulmonary infiltrates, working diagnosis of subacute ILD versus organizing pneumonia felt possibly secondary to inhalation injury from antifreeze leak in her car. Rapid response called on 8/30 and pt was intubated, extubated 12/25/17. CXR 12/26/17 with persistent coarse lung markings at both bases consistent with atelectasis or pneumonia.  Initial MBS recommended Dys 3, nectar thick liquids.  Pt has made excellent gains at bedside; therefore, recommend repeat MBS to determine readiness to advance.    Subjective: Pt and son arrive to fluoro Assessment / Plan / Recommendation CHL IP CLINICAL IMPRESSIONS 01/06/2018 Clinical Impression Pt presents with mildly decreased bolus cohesion with  large sips of thin liquids and mixed consistencies.  She also has mild pharyngeal residue at the pyriforms and vallecula with larger boluses.  Otherwise, her swallow response is timely with good airway protection during the swallow.  No aspiration or penetration was evident during today's study.  As a result, recommend advancing pt's diet to regular textures and thin liquids, meds may be administered whole with sips of liquids.    SLP Visit Diagnosis Dysphagia, oropharyngeal phase (R13.12)     Impact on safety and function Mild aspiration risk     CHL IP DIET RECOMMENDATION 01/06/2018 SLP Diet Recommendations Regular solids;Thin liquid Liquid Administration via Cup;Straw Medication Administration Whole meds with liquid Compensations Slow rate;Small sips/bites Postural Changes Remain semi-upright after after feeds/meals (Comment)            CHL IP ORAL PHASE 01/06/2018 Oral  Phase Impaired Oral - Pudding Teaspoon -- Oral - Pudding Cup -- Oral - Honey Teaspoon -- Oral - Honey Cup -- Oral - Nectar Teaspoon -- Oral - Nectar Cup WFL Oral - Nectar Straw -- Oral - Thin Teaspoon -- Oral - Thin Cup Premature spillage Oral - Thin Straw Premature spillage Oral - Puree -- Oral - Mech Soft -- Oral - Regular Premature spillage Oral - Multi-Consistency Premature spillage;Decreased bolus cohesion Oral - Pill -- Oral Phase - Comment --  CHL IP PHARYNGEAL PHASE 01/06/2018 Pharyngeal Phase Impaired Pharyngeal- Pudding Teaspoon -- Pharyngeal -- Pharyngeal- Pudding Cup -- Pharyngeal -- Pharyngeal- Honey Teaspoon -- Pharyngeal -- Pharyngeal- Honey Cup -- Pharyngeal -- Pharyngeal- Nectar Teaspoon -- Pharyngeal -- Pharyngeal- Nectar Cup WFL Pharyngeal -- Pharyngeal- Nectar Straw -- Pharyngeal -- Pharyngeal- Thin Teaspoon -- Pharyngeal -- Pharyngeal- Thin Cup Pharyngeal residue - pyriform;Pharyngeal residue - valleculae Pharyngeal -- Pharyngeal- Thin Straw Pharyngeal residue - pyriform;Pharyngeal residue - valleculae Pharyngeal -- Pharyngeal- Puree -- Pharyngeal -- Pharyngeal- Mechanical Soft -- Pharyngeal -- Pharyngeal- Regular Pharyngeal residue - pyriform Pharyngeal -- Pharyngeal- Multi-consistency Pharyngeal residue - pyriform Pharyngeal -- Pharyngeal- Pill Pharyngeal residue - pyriform;Pharyngeal residue - valleculae Pharyngeal -- Pharyngeal Comment --  CHL IP CERVICAL ESOPHAGEAL PHASE 01/06/2018 Cervical Esophageal Phase WFL Pudding Teaspoon -- Pudding Cup -- Honey Teaspoon -- Honey Cup -- Nectar Teaspoon -- Nectar Cup -- Nectar Straw -- Thin Teaspoon -- Thin Cup -- Thin Straw -- Puree -- Mechanical Soft -- Regular -- Multi-consistency -- Pill -- Cervical Esophageal Comment -- Emilio Math 01/06/2018, 1:48 PM              Dg Swallowing Func-speech Pathology  Result Date: 12/27/2017 Objective Swallowing Evaluation: Type of Study: MBS-Modified Barium Swallow Study  Patient Details Name: Hayley Horn  MRN: [id number] Date of Birth: 12/01/1950 Today's Date: 12/27/2017 Time: SLP Start Time (ACUTE ONLY): 8453 -SLP Stop Time (ACUTE ONLY): 1105 SLP Time Calculation (min) (ACUTE ONLY): 25 min Past Medical History: Past Medical History: Diagnosis Date . Acute respiratory failure ([place])  . CAP (community acquired pneumonia)  . Hyperlipidemia  . Hypertension  . Metabolic syndrome  Past Surgical History: Past Surgical History: Procedure Laterality Date . CESAREAN SECTION   HPI: 67 year old female patient admitted to [facility] 8/27 w/acute hypoxic respiratory failure in setting of bilateral pulmonary infiltrates, working diagnosis of subacute ILD versus organizing pneumonia felt possibly secondary to inhalation injury from antifreeze leak in her car. Rapid response called on 8/30 and pt was intubated, extubated 12/25/17. CXR 12/26/17 with persistent coarse lung markings at both bases consistent with atelectasis or pneumonia.  Subjective: Pt and son arrive to fluoro Assessment /  Plan / Recommendation CHL IP CLINICAL IMPRESSIONS 12/27/2017 Clinical Impression Patient presents with what appears to be an acute, reversible mild-moderate oropharyngeal dysphagia. Oral stage is prolonged with solids with mild-moderate lingual residue which appears to be due to xerostomia vs weakness. Pharyngeal stage noted for decreased epiglottic deflection, given reduced hyolarygneal excursion and presence of cortrak. There is trace penetration and silent aspiration of thin liquid during the swallow as a result, and penetration of nectar. Chin tuck eliminates penetration of nectar but not thin liquids due to premature spillage to the pyriforms before the swallow. Mild vallecular residue with dry solid which reduces with volitional dry swallow. Barium tablet lodged in the proximal esophagus and did not clear with 3 oz nectar wash. Ultimately passed into the stomach aided by a bolus of puree. Pt's son denies knowlegde of any esophageal deficits or stricture; MD may  consider further esophageal w/u if appropriate. Would initiate dys 3 (mechanical soft) diet and nectar thick liquids, with chin tuck, meds crushed in puree. Anticipate rapid improvements with additional time post-extubation, increased oral intake and removal of cortrak. Education provided to pt and her sons re: recommendations, precautions and prognosis. Will continue to follow. SLP Visit Diagnosis Dysphagia, oropharyngeal phase (R13.12) Attention and concentration deficit following -- Frontal lobe and executive function deficit following -- Impact on safety and function Mild aspiration risk;Moderate aspiration risk   CHL IP TREATMENT RECOMMENDATION 12/27/2017 Treatment Recommendations Therapy as outlined in treatment plan below   Prognosis 12/27/2017 Prognosis for Safe Diet Advancement Good Barriers to Reach Goals -- Barriers/Prognosis Comment -- CHL IP DIET RECOMMENDATION 12/27/2017 SLP Diet Recommendations Dysphagia 3 (Mech soft) solids;Nectar thick liquid Liquid Administration via Cup;Straw Medication Administration Crushed with puree Compensations Slow rate;Small sips/bites;Follow solids with liquid;Chin tuck Postural Changes Seated upright at 90 degrees;Remain semi-upright after after feeds/meals (Comment)   CHL IP OTHER RECOMMENDATIONS 12/27/2017 Recommended Consults Consider esophageal assessment Oral Care Recommendations Oral care BID Other Recommendations Prohibited food (jello, ice cream, thin soups);Remove water pitcher;Order thickener from pharmacy   CHL IP FOLLOW UP RECOMMENDATIONS 12/27/2017 Follow up Recommendations Other (comment)   CHL IP FREQUENCY AND DURATION 12/27/2017 Speech Therapy Frequency (ACUTE ONLY) min 2x/week Treatment Duration 2 weeks      CHL IP ORAL PHASE 12/27/2017 Oral Phase Impaired Oral - Pudding Teaspoon -- Oral - Pudding Cup -- Oral - Honey Teaspoon -- Oral - Honey Cup -- Oral - Nectar Teaspoon -- Oral - Nectar Cup -- Oral - Nectar Straw Premature spillage;Piecemeal swallowing Oral - Thin  Teaspoon -- Oral - Thin Cup Piecemeal swallowing;Premature spillage Oral - Thin Straw -- Oral - Puree Lingual/palatal residue Oral - Mech Soft -- Oral - Regular Decreased bolus cohesion;Lingual/palatal residue;Piecemeal swallowing Oral - Multi-Consistency -- Oral - Pill -- Oral Phase - Comment --  CHL IP PHARYNGEAL PHASE 12/27/2017 Pharyngeal Phase Impaired Pharyngeal- Pudding Teaspoon -- Pharyngeal -- Pharyngeal- Pudding Cup -- Pharyngeal -- Pharyngeal- Honey Teaspoon -- Pharyngeal -- Pharyngeal- Honey Cup -- Pharyngeal -- Pharyngeal- Nectar Teaspoon Delayed swallow initiation-vallecula;Reduced epiglottic inversion;Reduced airway/laryngeal closure;Penetration/Aspiration during swallow;Reduced laryngeal elevation Pharyngeal Material enters airway, remains ABOVE vocal cords and not ejected out;Material does not enter airway Pharyngeal- Nectar Cup Delayed swallow initiation-pyriform sinuses;Reduced epiglottic inversion;Reduced airway/laryngeal closure;Penetration/Aspiration during swallow;Reduced laryngeal elevation Pharyngeal Material enters airway, remains ABOVE vocal cords and not ejected out;Material does not enter airway Pharyngeal- Nectar Straw Delayed swallow initiation-vallecula;Compensatory strategies attempted (with notebox) Pharyngeal Material does not enter airway Pharyngeal- Thin Teaspoon Delayed swallow initiation-vallecula;Reduced epiglottic inversion;Reduced laryngeal elevation;Reduced airway/laryngeal closure;Penetration/Aspiration during swallow;Trace aspiration  Pharyngeal Material enters airway, passes BELOW cords without attempt by patient to eject out (silent aspiration) Pharyngeal- Thin Cup Delayed swallow initiation-pyriform sinuses;Reduced epiglottic inversion;Reduced laryngeal elevation;Reduced airway/laryngeal closure;Penetration/Aspiration during swallow;Trace aspiration;Compensatory strategies attempted (with notebox) Pharyngeal Material enters airway, remains ABOVE vocal cords and not  ejected out;Material enters airway, passes BELOW cords without attempt by patient to eject out (silent aspiration) Pharyngeal- Thin Straw -- Pharyngeal -- Pharyngeal- Puree WFL Pharyngeal -- Pharyngeal- Mechanical Soft -- Pharyngeal -- Pharyngeal- Regular Reduced epiglottic inversion;Reduced laryngeal elevation;Pharyngeal residue - valleculae Pharyngeal -- Pharyngeal- Multi-consistency -- Pharyngeal -- Pharyngeal- Pill Reduced epiglottic inversion;Reduced laryngeal elevation;Compensatory strategies attempted (with notebox) Pharyngeal -- Pharyngeal Comment --  CHL IP CERVICAL ESOPHAGEAL PHASE 12/27/2017 Cervical Esophageal Phase WFL Pudding Teaspoon -- Pudding Cup -- Honey Teaspoon -- Honey Cup -- Nectar Teaspoon -- Nectar Cup -- Nectar Straw -- Thin Teaspoon -- Thin Cup -- Thin Straw -- Puree -- Mechanical Soft -- Regular -- Multi-consistency -- Pill -- Cervical Esophageal Comment -- Deneise Lever, MS, CCC-SLP Speech-Language Pathologist Acute Rehabilitation Services Pager: [phone] Office: [phone] Aliene Altes 12/27/2017, 12:00 PM                Assessment & Plan:   BOOP (bronchiolitis obliterans with organizing pneumonia) ([place]) Repeat CXR 01/22/2018 - Interval resolution of bibasilar pneumonia. Chronic underlying interstitial lung disease is stable  Interstitial lung disease ([place]) - FU with Dr. Chase Caller in ILD clinic in 4-6 weeks - Consider repeat Chest CT in 2 months   Acute respiratory failure ([place]) -Resolved; Feels well, VSS. O2 sat 96% RA  Hypertension - BP 150/90 provider check today - Daughter states SBP has been running between 120-143 at home - Monitor, if consistently >140/90 follow up with PCP      Martyn Ehrich, NP 01/23/2018

## 2018-01-22 NOTE — Telephone Encounter (Signed)
Diagnosis can be A. fib and hypertension

## 2018-01-22 NOTE — Telephone Encounter (Signed)
Referral placed - daughter aware.

## 2018-01-22 NOTE — Telephone Encounter (Signed)
We can refer her to [facility] in [place] or branch in [place]

## 2018-01-23 ENCOUNTER — Encounter: Payer: Self-pay | Admitting: Primary Care

## 2018-01-23 DIAGNOSIS — R1310 Dysphagia, unspecified: Secondary | ICD-10-CM

## 2018-01-23 DIAGNOSIS — R7303 Prediabetes: Secondary | ICD-10-CM | POA: Diagnosis not present

## 2018-01-23 DIAGNOSIS — J849 Interstitial pulmonary disease, unspecified: Secondary | ICD-10-CM | POA: Diagnosis not present

## 2018-01-23 DIAGNOSIS — M170 Bilateral primary osteoarthritis of knee: Secondary | ICD-10-CM

## 2018-01-23 DIAGNOSIS — I10 Essential (primary) hypertension: Secondary | ICD-10-CM | POA: Diagnosis not present

## 2018-01-23 DIAGNOSIS — K219 Gastro-esophageal reflux disease without esophagitis: Secondary | ICD-10-CM

## 2018-01-23 DIAGNOSIS — T380X5D Adverse effect of glucocorticoids and synthetic analogues, subsequent encounter: Secondary | ICD-10-CM | POA: Diagnosis not present

## 2018-01-23 DIAGNOSIS — J45998 Other asthma: Secondary | ICD-10-CM | POA: Diagnosis not present

## 2018-01-23 DIAGNOSIS — D638 Anemia in other chronic diseases classified elsewhere: Secondary | ICD-10-CM | POA: Diagnosis not present

## 2018-01-23 DIAGNOSIS — E876 Hypokalemia: Secondary | ICD-10-CM

## 2018-01-23 DIAGNOSIS — E785 Hyperlipidemia, unspecified: Secondary | ICD-10-CM

## 2018-01-23 DIAGNOSIS — J84116 Cryptogenic organizing pneumonia: Secondary | ICD-10-CM | POA: Diagnosis not present

## 2018-01-23 DIAGNOSIS — D696 Thrombocytopenia, unspecified: Secondary | ICD-10-CM

## 2018-01-23 DIAGNOSIS — R339 Retention of urine, unspecified: Secondary | ICD-10-CM | POA: Diagnosis not present

## 2018-01-23 DIAGNOSIS — F329 Major depressive disorder, single episode, unspecified: Secondary | ICD-10-CM

## 2018-01-23 NOTE — Assessment & Plan Note (Signed)
-   FU with Dr. Chase Caller in ILD clinic in 4-6 weeks - Consider repeat Chest CT in 2 months

## 2018-01-23 NOTE — Assessment & Plan Note (Signed)
Repeat CXR 01/22/2018 - Interval resolution of bibasilar pneumonia. Chronic underlying interstitial lung disease is stable

## 2018-01-23 NOTE — Assessment & Plan Note (Signed)
-  Resolved; Feels well, VSS. O2 sat 96% RA

## 2018-01-23 NOTE — Assessment & Plan Note (Addendum)
-   BP 150/90 provider check today - Daughter states SBP has been running between 120-143 at home - Monitor, if consistently >140/90 follow up with PCP

## 2018-01-26 DIAGNOSIS — J84116 Cryptogenic organizing pneumonia: Secondary | ICD-10-CM | POA: Diagnosis not present

## 2018-01-26 DIAGNOSIS — J849 Interstitial pulmonary disease, unspecified: Secondary | ICD-10-CM | POA: Diagnosis not present

## 2018-01-26 DIAGNOSIS — I10 Essential (primary) hypertension: Secondary | ICD-10-CM | POA: Diagnosis not present

## 2018-01-26 DIAGNOSIS — I481 Persistent atrial fibrillation: Secondary | ICD-10-CM | POA: Diagnosis not present

## 2018-01-26 DIAGNOSIS — D696 Thrombocytopenia, unspecified: Secondary | ICD-10-CM | POA: Diagnosis not present

## 2018-01-26 DIAGNOSIS — J45998 Other asthma: Secondary | ICD-10-CM | POA: Diagnosis not present

## 2018-01-28 DIAGNOSIS — J84116 Cryptogenic organizing pneumonia: Secondary | ICD-10-CM | POA: Diagnosis not present

## 2018-01-28 DIAGNOSIS — I481 Persistent atrial fibrillation: Secondary | ICD-10-CM | POA: Diagnosis not present

## 2018-01-28 DIAGNOSIS — J45998 Other asthma: Secondary | ICD-10-CM | POA: Diagnosis not present

## 2018-01-28 DIAGNOSIS — D696 Thrombocytopenia, unspecified: Secondary | ICD-10-CM | POA: Diagnosis not present

## 2018-01-28 DIAGNOSIS — I10 Essential (primary) hypertension: Secondary | ICD-10-CM | POA: Diagnosis not present

## 2018-01-28 DIAGNOSIS — J849 Interstitial pulmonary disease, unspecified: Secondary | ICD-10-CM | POA: Diagnosis not present

## 2018-02-02 DIAGNOSIS — I481 Persistent atrial fibrillation: Secondary | ICD-10-CM | POA: Diagnosis not present

## 2018-02-02 DIAGNOSIS — J849 Interstitial pulmonary disease, unspecified: Secondary | ICD-10-CM | POA: Diagnosis not present

## 2018-02-02 DIAGNOSIS — D696 Thrombocytopenia, unspecified: Secondary | ICD-10-CM | POA: Diagnosis not present

## 2018-02-02 DIAGNOSIS — J84116 Cryptogenic organizing pneumonia: Secondary | ICD-10-CM | POA: Diagnosis not present

## 2018-02-02 DIAGNOSIS — J45998 Other asthma: Secondary | ICD-10-CM | POA: Diagnosis not present

## 2018-02-02 DIAGNOSIS — I10 Essential (primary) hypertension: Secondary | ICD-10-CM | POA: Diagnosis not present

## 2018-02-03 ENCOUNTER — Telehealth: Payer: Self-pay

## 2018-02-03 DIAGNOSIS — J84116 Cryptogenic organizing pneumonia: Secondary | ICD-10-CM | POA: Diagnosis not present

## 2018-02-03 DIAGNOSIS — J45998 Other asthma: Secondary | ICD-10-CM | POA: Diagnosis not present

## 2018-02-03 DIAGNOSIS — J849 Interstitial pulmonary disease, unspecified: Secondary | ICD-10-CM | POA: Diagnosis not present

## 2018-02-03 DIAGNOSIS — D696 Thrombocytopenia, unspecified: Secondary | ICD-10-CM | POA: Diagnosis not present

## 2018-02-03 DIAGNOSIS — I10 Essential (primary) hypertension: Secondary | ICD-10-CM | POA: Diagnosis not present

## 2018-02-03 DIAGNOSIS — I481 Persistent atrial fibrillation: Secondary | ICD-10-CM | POA: Diagnosis not present

## 2018-02-03 NOTE — Telephone Encounter (Signed)
Angie with [facility] called to report at BP reading that she got while visiting with patient today. It was 170/108 this morning. She states that patient had just taken her medication right before she came. She also advised that her daughters would repeat her BP this afternoon and continue to monitor. If you wish to change anything you can reach Angie with [facility] at [phone]

## 2018-02-03 NOTE — Telephone Encounter (Signed)
Daughter called back after rechecking BP and it is currently 148/76. Daughter also wants to know if they should continue BP meds as you prescribed? Please review and advise

## 2018-02-03 NOTE — Telephone Encounter (Signed)
Yes that blood pressure sounds a lot better, continue to take it as prescribed.

## 2018-02-03 NOTE — Telephone Encounter (Signed)
lmtcb

## 2018-02-03 NOTE — Telephone Encounter (Signed)
Now I agree just recheck her in the afternoon

## 2018-02-04 DIAGNOSIS — J84116 Cryptogenic organizing pneumonia: Secondary | ICD-10-CM | POA: Diagnosis not present

## 2018-02-04 DIAGNOSIS — J45998 Other asthma: Secondary | ICD-10-CM | POA: Diagnosis not present

## 2018-02-04 DIAGNOSIS — I481 Persistent atrial fibrillation: Secondary | ICD-10-CM | POA: Diagnosis not present

## 2018-02-04 DIAGNOSIS — J849 Interstitial pulmonary disease, unspecified: Secondary | ICD-10-CM | POA: Diagnosis not present

## 2018-02-04 DIAGNOSIS — I10 Essential (primary) hypertension: Secondary | ICD-10-CM | POA: Diagnosis not present

## 2018-02-04 DIAGNOSIS — D696 Thrombocytopenia, unspecified: Secondary | ICD-10-CM | POA: Diagnosis not present

## 2018-02-04 NOTE — Telephone Encounter (Signed)
lmtcb

## 2018-02-06 LAB — ACID FAST SMEAR (AFB): ACID FAST SMEAR - AFSCU2: NEGATIVE

## 2018-02-06 LAB — ACID FAST SMEAR (AFB, MYCOBACTERIA)

## 2018-02-06 NOTE — Telephone Encounter (Signed)
I have her on lisinopril 20 daily and carvedilol 3.125 twice a day which should be 3 pills a day

## 2018-02-06 NOTE — Telephone Encounter (Signed)
LM for [facility]

## 2018-02-06 NOTE — Telephone Encounter (Signed)
Spoke with patient's daughter.  She is confused as to whether she is to continue taking the Lisinopril that she was given at the hospital and she was taking when she saw you for follow up OR if she is to go back on the medication she was taking before hospitalization.  I told her I felt it was Lisinopril but I would double check with you.  She also needs refills of which ever one it is sent to [facility].

## 2018-02-08 ENCOUNTER — Other Ambulatory Visit: Payer: Self-pay | Admitting: Physical Medicine and Rehabilitation

## 2018-02-09 ENCOUNTER — Other Ambulatory Visit: Payer: Self-pay | Admitting: Family Medicine

## 2018-02-09 DIAGNOSIS — J84116 Cryptogenic organizing pneumonia: Secondary | ICD-10-CM | POA: Diagnosis not present

## 2018-02-09 DIAGNOSIS — I10 Essential (primary) hypertension: Secondary | ICD-10-CM | POA: Diagnosis not present

## 2018-02-09 DIAGNOSIS — D696 Thrombocytopenia, unspecified: Secondary | ICD-10-CM | POA: Diagnosis not present

## 2018-02-09 DIAGNOSIS — J849 Interstitial pulmonary disease, unspecified: Secondary | ICD-10-CM | POA: Diagnosis not present

## 2018-02-09 DIAGNOSIS — I481 Persistent atrial fibrillation: Secondary | ICD-10-CM | POA: Diagnosis not present

## 2018-02-09 DIAGNOSIS — J45998 Other asthma: Secondary | ICD-10-CM | POA: Diagnosis not present

## 2018-02-09 MED ORDER — CARVEDILOL 3.125 MG PO TABS: 3.1250 mg | ORAL_TABLET | Freq: Two times a day (BID) | ORAL | 0 refills | Status: DC

## 2018-02-09 MED ORDER — LISINOPRIL 5 MG PO TABS: 5.0000 mg | ORAL_TABLET | Freq: Every day | ORAL | 0 refills | Status: DC

## 2018-02-09 NOTE — Telephone Encounter (Signed)
What is the name of the medication? Lisinopril and Coreg Have you contacted your pharmacy to request a refill? yes  Which pharmacy would you like this sent to?[facility]   Patient notified that their request is being sent to the clinical staff for review and that they should receive a call once it is complete. If they do not receive a call within 24 hours they can check with their pharmacy or our office.   Patient completely out!

## 2018-02-09 NOTE — Telephone Encounter (Signed)
Aware refill has been sent to pharmacy 

## 2018-02-16 ENCOUNTER — Encounter: Payer: Self-pay | Admitting: Family Medicine

## 2018-02-16 ENCOUNTER — Ambulatory Visit (INDEPENDENT_AMBULATORY_CARE_PROVIDER_SITE_OTHER): Payer: Medicare Other | Admitting: Family Medicine

## 2018-02-16 VITALS — BP 136/82 | HR 83 | Temp 98.7°F | Ht 61.0 in | Wt 144.2 lb

## 2018-02-16 DIAGNOSIS — R7989 Other specified abnormal findings of blood chemistry: Secondary | ICD-10-CM

## 2018-02-16 DIAGNOSIS — R053 Chronic cough: Secondary | ICD-10-CM

## 2018-02-16 DIAGNOSIS — E039 Hypothyroidism, unspecified: Secondary | ICD-10-CM | POA: Diagnosis not present

## 2018-02-16 DIAGNOSIS — R05 Cough: Secondary | ICD-10-CM

## 2018-02-16 MED ORDER — BENZONATATE 100 MG PO CAPS: 100.0000 mg | ORAL_CAPSULE | Freq: Two times a day (BID) | ORAL | 1 refills | Status: DC

## 2018-02-16 MED ORDER — LOSARTAN POTASSIUM 25 MG PO TABS: 12.5000 mg | ORAL_TABLET | Freq: Two times a day (BID) | ORAL | 1 refills | Status: DC

## 2018-02-16 NOTE — Progress Notes (Signed)
BP 136/82   Pulse 83   Temp 98.7 F (37.1 C) (Oral)   Ht 5\' 1"  (1.549 m)   Wt 144 lb 3.2 oz (65.4 kg)   BMI 27.25 kg/m    Subjective:    [name], female    DOB: Jul 16, 1950, 67 y.o.   MRN: [id number]  HPI: Tasha [name] is a 67 y.o. female presenting on 02/16/2018 for Medical Management of Chronic Issues   HPI Elevated TSH Patient is coming in for recheck of TSH is been elevated on the past couple tries.  She has not been diagnosed with hypothyroidism to this point but it looks like her TSH is been running around 7 and then this last time was around 13  Patient says she is been having a cough despite having just recovered from pneumonia she says the cough was there even before then and the cough has continued to be there after she is feel like she is recovered from the pneumonia and not having any breathing troubles or any issues with that.  Patient denies any shortness of breath or wheezing or fevers or chills and says the cough is been dry and sometimes she will go on the coughing fits that are more annoying.  She says she does not have any issue with the coughing at nighttime.  Relevant past medical, surgical, family and social history reviewed and updated as indicated. Interim medical history since our last visit reviewed. Allergies and medications reviewed and updated.  Review of Systems  Constitutional: Negative for chills and fever.  HENT: Negative for congestion, ear discharge, ear pain, sinus pressure, sinus pain and sneezing.   Eyes: Negative for visual disturbance.  Respiratory: Positive for cough. Negative for chest tightness, shortness of breath and wheezing.   Cardiovascular: Negative for chest pain and leg swelling.  Endocrine: Negative for cold intolerance, heat intolerance and polyuria.  Musculoskeletal: Negative for back pain and gait problem.  Skin: Negative for rash.  Neurological: Negative for light-headedness and headaches.    Psychiatric/Behavioral: Negative for agitation and behavioral problems.  All other systems reviewed and are negative.   Per HPI unless specifically indicated above   Allergies as of 02/16/2018      Reactions   Homatropine    Pt doesn't remember   Hydromet [hydrocodone-homatropine]    Pt doesn't remember   Amoxicillin Rash      Medication List        Accurate as of 02/16/18 11:39 AM. Always use your most recent med list.          acetaminophen 325 MG tablet Commonly known as:  TYLENOL Take 1-2 tablets (325-650 mg total) by mouth every 4 (four) hours as needed for mild pain.   benzonatate 100 MG capsule Commonly known as:  TESSALON Take 1 capsule (100 mg total) by mouth 2 (two) times daily.   carvedilol 3.125 MG tablet Commonly known as:  COREG Take 1 tablet (3.125 mg total) by mouth 2 (two) times daily with a meal.   cetirizine 10 MG tablet Commonly known as:  ZYRTEC Take 1 tablet (10 mg total) by mouth daily as needed for allergies.   docusate sodium 100 MG capsule Commonly known as:  COLACE Take 2 capsules (200 mg total) by mouth 2 (two) times daily. For constipation   Ipratropium-Albuterol 20-100 MCG/ACT Aers respimat Commonly known as:  COMBIVENT Inhale 1 puff into the lungs every 6 (six) hours as needed for wheezing.   losartan 25 MG tablet Commonly  known as:  COZAAR Take 0.5 tablets (12.5 mg total) by mouth 2 (two) times daily.   pantoprazole 40 MG tablet Commonly known as:  PROTONIX Take 1 tablet (40 mg total) by mouth daily.   potassium chloride SA 20 MEQ tablet Commonly known as:  K-DUR,KLOR-CON Take 1 tablet (20 mEq total) by mouth 2 (two) times daily.   sodium chloride 0.65 % Soln nasal spray Commonly known as:  OCEAN Place 1 spray into both nostrils 4 (four) times daily -  with meals and at bedtime.   thiamine 100 MG tablet Take 1 tablet (100 mg total) by mouth daily.          Objective:    BP 136/82   Pulse 83   Temp 98.7 F  (37.1 C) (Oral)   Ht 5\' 1"  (1.549 m)   Wt 144 lb 3.2 oz (65.4 kg)   BMI 27.25 kg/m   Wt Readings from Last 3 Encounters:  02/16/18 144 lb 3.2 oz (65.4 kg)  01/22/18 142 lb 3.2 oz (64.5 kg)  01/21/18 143 lb 3.2 oz (65 kg)    Physical Exam  Constitutional: She is oriented to person, place, and time. She appears well-developed and well-nourished. No distress.  HENT:  Nose: Nose normal.  Mouth/Throat: Oropharynx is clear and moist. No oropharyngeal exudate.  Eyes: Pupils are equal, round, and reactive to light. Conjunctivae and EOM are normal.  Neck: Neck supple. No thyromegaly present.  Cardiovascular: Normal rate, regular rhythm, normal heart sounds and intact distal pulses.  No murmur heard. Pulmonary/Chest: Effort normal and breath sounds normal. No respiratory distress. She has no wheezes.  Musculoskeletal: Normal range of motion. She exhibits no edema.  Lymphadenopathy:    She has no cervical adenopathy.  Neurological: She is alert and oriented to person, place, and time. Coordination normal.  Skin: Skin is warm and dry. No rash noted. She is not diaphoretic.  Psychiatric: She has a normal mood and affect. Her behavior is normal.  Nursing note and vitals reviewed.       Assessment & Plan:   Problem List Items Addressed This Visit    None    Visit Diagnoses    Elevated TSH    -  Primary   Relevant Orders   Thyroid Panel With TSH (Completed)   Persistent cough       Relevant Medications   losartan (COZAAR) 25 MG tablet   benzonatate (TESSALON PERLES) 100 MG capsule      Will change her ACE inhibitor to an angiotensin receptor blocker and give Tessalon Perles and see if that improves her cough. Follow up plan: Return in about 4 weeks (around 03/16/2018), or if symptoms worsen or fail to improve, for Hypertension.  Counseling provided for all of the vaccine components Orders Placed This Encounter  Procedures  . Thyroid Panel With TSH    Caryl Pina,  MD [facility] 02/16/2018, 11:39 AM

## 2018-02-16 NOTE — Telephone Encounter (Signed)
Patient had appt today

## 2018-02-17 LAB — THYROID PANEL WITH TSH
Free Thyroxine Index: 1.2 (ref 1.2–4.9)
T3 Uptake Ratio: 23 % — ABNORMAL LOW (ref 24–39)
T4, Total: 5.3 ug/dL (ref 4.5–12.0)
TSH: 13.56 u[IU]/mL — ABNORMAL HIGH (ref 0.450–4.500)

## 2018-02-18 ENCOUNTER — Encounter (HOSPITAL_BASED_OUTPATIENT_CLINIC_OR_DEPARTMENT_OTHER): Payer: Medicare Other | Admitting: Physical Medicine & Rehabilitation

## 2018-02-18 ENCOUNTER — Encounter: Payer: Self-pay | Admitting: Physical Medicine & Rehabilitation

## 2018-02-18 ENCOUNTER — Encounter: Payer: Self-pay | Admitting: *Deleted

## 2018-02-18 ENCOUNTER — Other Ambulatory Visit: Payer: Self-pay | Admitting: *Deleted

## 2018-02-18 ENCOUNTER — Other Ambulatory Visit: Payer: Self-pay

## 2018-02-18 VITALS — BP 183/92 | HR 69 | Ht 61.0 in | Wt 145.0 lb

## 2018-02-18 DIAGNOSIS — E876 Hypokalemia: Secondary | ICD-10-CM | POA: Diagnosis not present

## 2018-02-18 DIAGNOSIS — J8489 Other specified interstitial pulmonary diseases: Secondary | ICD-10-CM

## 2018-02-18 DIAGNOSIS — R269 Unspecified abnormalities of gait and mobility: Secondary | ICD-10-CM | POA: Diagnosis not present

## 2018-02-18 DIAGNOSIS — D62 Acute posthemorrhagic anemia: Secondary | ICD-10-CM

## 2018-02-18 DIAGNOSIS — R5381 Other malaise: Secondary | ICD-10-CM | POA: Insufficient documentation

## 2018-02-18 DIAGNOSIS — R740 Nonspecific elevation of levels of transaminase and lactic acid dehydrogenase [LDH]: Secondary | ICD-10-CM | POA: Diagnosis not present

## 2018-02-18 DIAGNOSIS — J849 Interstitial pulmonary disease, unspecified: Secondary | ICD-10-CM

## 2018-02-18 DIAGNOSIS — I480 Paroxysmal atrial fibrillation: Secondary | ICD-10-CM

## 2018-02-18 DIAGNOSIS — I10 Essential (primary) hypertension: Secondary | ICD-10-CM

## 2018-02-18 DIAGNOSIS — I169 Hypertensive crisis, unspecified: Secondary | ICD-10-CM | POA: Diagnosis not present

## 2018-02-18 DIAGNOSIS — R7401 Elevation of levels of liver transaminase levels: Secondary | ICD-10-CM

## 2018-02-18 DIAGNOSIS — E0781 Sick-euthyroid syndrome: Secondary | ICD-10-CM

## 2018-02-18 DIAGNOSIS — D696 Thrombocytopenia, unspecified: Secondary | ICD-10-CM | POA: Diagnosis not present

## 2018-02-18 DIAGNOSIS — I4891 Unspecified atrial fibrillation: Secondary | ICD-10-CM | POA: Diagnosis not present

## 2018-02-18 MED ORDER — LEVOTHYROXINE SODIUM 50 MCG PO TABS: 50.0000 ug | ORAL_TABLET | Freq: Every day | ORAL | 1 refills | Status: DC

## 2018-02-18 NOTE — Progress Notes (Signed)
Subjective:    [name], female    DOB: Sep 08, 1950, 67 y.o.   MRN: [id number]  HPI 67 year old female with history of hypertension, metabolic syndrome, presents for follow up for BOOP.   Last clinic visit 01/21/18. Daughter present, who provides history. Interpretor present.  Since that time, she states, she completed therapies.  She is doing HEP. BP is elevated, she has been in communication with PCP and meds were changed yesterday. She had her TSH checked and checked again, which is even higher, being addressed by PCP. She has not been placed on cardiac monitor as of yet. She obtained other labs, which were improved.  She had a mechanical fall tripping over long pants, twisting her leg last week, which is improving.  Pain Inventory Average Pain 0 Pain Right Now 0 My pain is no pain  In the last 24 hours, has pain interfered with the following? General activity 0 Relation with others 0 Enjoyment of life 0 What TIME of day is your pain at its worst? no pain Sleep (in general) Good  Pain is worse with: no pain Pain improves with: no pain Relief from Meds: no pain  Mobility how many minutes can you walk? 30 ability to climb steps?  yes do you drive?  no  Function retired  Neuro/Psych No problems in this area  Prior Studies Any changes since last visit?  no  Physicians involved in your care Primary care Dr Jenny Reichmann Dettinger   Family History  Problem Relation Age of Onset  . Cancer Father   . Healthy Brother   . Healthy Daughter   . Healthy Brother    Social History   Socioeconomic History  . Marital status: Married    Spouse name: Not on file  . Number of children: Not on file  . Years of education: Not on file  . Highest education level: Not on file  Occupational History  . Not on file  Social Needs  . Financial resource strain: Not hard at all  . Food insecurity:    Worry: Never true    Inability: Never true  . Transportation needs:   Medical: No    Non-medical: No  Tobacco Use  . Smoking status: Never Smoker  . Smokeless tobacco: Never Used  Substance and Sexual Activity  . Alcohol use: No  . Drug use: No  . Sexual activity: Not Currently    Birth control/protection: Post-menopausal  Lifestyle  . Physical activity:    Days per week: 7 days    Minutes per session: 30 min  . Stress: Only a little  Relationships  . Social connections:    Talks on phone: Never    Gets together: Never    Attends religious service: More than 4 times per year    Active member of club or organization: Yes    Attends meetings of clubs or organizations: More than 4 times per year    Relationship status: Married  Other Topics Concern  . Not on file  Social History Narrative  . Not on file   Past Surgical History:  Procedure Laterality Date  . CESAREAN SECTION     Past Medical History:  Diagnosis Date  . Acute respiratory failure ([place])   . CAP (community acquired pneumonia)   . Hyperlipidemia   . Hypertension   . Metabolic syndrome    BP (!) 183/92   Pulse 69   Ht 5\' 1"  (1.549 m)   Wt 145 lb (  65.8 kg)   SpO2 98%   BMI 27.40 kg/m   Opioid Risk Score:   Fall Risk Score:  `1  Depression screen PHQ 2/9  Depression screen [facility] 2/9 02/18/2018 02/16/2018 01/21/2018 01/16/2018 12/05/2017 12/01/2017 11/14/2017  Decreased Interest 0 0 0 0 0 2 0  Down, Depressed, Hopeless 0 0 0 0 0 2 0  PHQ - 2 Score 0 0 0 0 0 4 0  Altered sleeping - - - - - 3 -  Tired, decreased energy - - - - - 3 -  Change in appetite - - - - - 3 -  Feeling bad or failure about yourself  - - - - - 2 -  Trouble concentrating - - - - - 3 -  Moving slowly or fidgety/restless - - - - - 2 -  Suicidal thoughts - - - - - 1 -  PHQ-9 Score - - - - - 21 -  Difficult doing work/chores - - - - - - -  Some recent data might be hidden   Review of Systems  Constitutional: Negative.   HENT: Negative.   Eyes: Negative.   Respiratory: Positive for cough. Negative for  shortness of breath.   Cardiovascular: Negative.   Gastrointestinal: Negative.   Endocrine: Negative.   Genitourinary: Negative.   Musculoskeletal: Positive for gait problem.  Skin: Negative.   Allergic/Immunologic: Negative.   Neurological: Negative for weakness.  Hematological: Negative.   Psychiatric/Behavioral: Negative.   All other systems reviewed and are negative.      Objective:   Physical Exam Constitutional: She appears well-developed and well-nourished.  HENT: normocephalic. Atraumatic Eyes: EOMI. No discharge. Cardiovascular: RRR. No JVD. Respiratory: Effort normal and breath sounds normal.  GI: Bowel sounds are normal. She exhibits no distension.  Musculoskeletal: No edema or tenderness in extremities Neurological:  Dysphonia, improved Motor: Grossly 5/5 proximal to distal Skin: Skin is warm.  Psychiatric: She has a normal mood and affect. Her behavior is normal.    Assessment & Plan:  67 year old female with history of hypertension, metabolic syndrome, presents for follow up for BOOP.   1.  Functional and mobility deficits secondary to debility after multiple medical issues  Cont HEP  Functionally improving  2. HTN:   Hypertensive crisis today  Medications changed yesterday  Follow up with PCP  3. A fib with RVR:   Due to stress and resolved with amiodarone.   Sick euthyroid, TSH reviewed, trending up  Follow up with PCP for medication changes  Pt needs Cards follow up for 30 day monitor   4. Subacute ILD:   Cont slow steroid taper   5. Hypokalemia: Resolved, labs reviewed  6. Transaminitis: Resolved  Labs reviewed  7. Gait abnormality  Cont HEP  No assistive device at present  7. Acute blood loss anemia on ? Anemia of chronic disease  Labs reviewed, improving  8. Thrombocytopenia  Will reviewed, improving

## 2018-02-19 ENCOUNTER — Other Ambulatory Visit (INDEPENDENT_AMBULATORY_CARE_PROVIDER_SITE_OTHER): Payer: Medicare Other

## 2018-02-19 ENCOUNTER — Ambulatory Visit (INDEPENDENT_AMBULATORY_CARE_PROVIDER_SITE_OTHER): Payer: Medicare Other | Admitting: Internal Medicine

## 2018-02-19 ENCOUNTER — Encounter: Payer: Self-pay | Admitting: Internal Medicine

## 2018-02-19 VITALS — BP 126/82 | HR 69 | Ht 61.0 in | Wt 143.8 lb

## 2018-02-19 DIAGNOSIS — J8489 Other specified interstitial pulmonary diseases: Secondary | ICD-10-CM

## 2018-02-19 DIAGNOSIS — R0602 Shortness of breath: Secondary | ICD-10-CM

## 2018-02-19 LAB — SEDIMENTATION RATE: Sed Rate: 18 mm/hr (ref 0–30)

## 2018-02-19 NOTE — Progress Notes (Signed)
OV 01/22/18 67 year old female, never smoked. PMH  Hypertension, metabolic syndrome, thrombocytopenia, prediabetes, interstitial lung disease, PAF. Not previously followed by [facility] pulmonary care. Admitted to [facility] from outside hospital on 08/27 with acute hypoxic respiratory failure in setting of bilateral pulmonary infiltrates. Work up indicated [place]. Dx subacute ILD vs pneumonia thought to be from exposure to antifreeze leak in patients car. Requiring intubation from 08/30-09/05, chest tube placed due to pneumothorax. [facility] from 09/10- 09/20, required min assistance with mobility and basic self-care tasks. Speech therapy worked on dysphagia, able to tolerate regular diet thin liquids without signs and symptoms of aspiration.    Patient presents today for hospital follow-up visit. Accompanied by her daughter who also provides translation. She is feeling a lot better, no breathing issues. Finished oral steroid today. BS 119. CBC improving. Eating and drinking ok. No difficulties swallowing and denies aspiration. Ambulting with walker. Denies fall, sob, wheezing, cough, fever or chest pain.    OV 02/19/2018  Subjective:  [name], female , DOB: 18-Apr-1951 , age 67 y.o. , MRN: [id number] , ADDRESS: [address] [place] [address]   02/19/2018 -   Chief Complaint  Patient presents with  . Follow-up    States she still has dry cough, states the SOB has resolved. Denies chest pain or idscomfort.      HPI Tasha [name] 67 y.o. -accompanied by her daughter Verdis Frederickson.  End of August 2019 she suffered acute lung injury possibly Boop based on the nature of infiltrates and a high ESR and a positive ANA [BAL lymphocytosis].  There is some history of antifreeze exposure.  She has recovered from it and is now at home.  She is brought in by Dr. Verdis Frederickson.  Verdis Frederickson is doing the translation.  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  Post discharge in the hospital patient is off oxygen and is overall better.  Steroids ended 2 weeks ago but since the completion of steroids she feels the cough is coming back and getting worse.  Currently cough is moderate in intensity and without any sputum production.  2 days ago her primary care physician apparently changed 1 of her antihypertensives because of the cough.  I am unable to determine which one but I suspect it was an ACE inhibitor.  She is currently on losartan.  There is no fever or chills.  Current walking desaturation test appears adequate   Simple office walk 185 feet x  3 laps goal with forehead probe 02/19/2018   O2 used rooom air  Number laps completed 3  Comments about pace Moderate pace  Resting Pulse Ox/HR 99% and 71/min  Final Pulse Ox/HR 95% and 92/min  Desaturated </= 88% no  Desaturated <= 3% points yes  Got Tachycardic >/= 90/min yes  Symptoms at end of test none  Miscellaneous comments x       Results for Tasha, [name] (MRN [id number]) as of 02/19/2018 10:01  Ref. Range 12/16/2017 19:24 12/17/2017 18:59  Sed Rate Latest Ref Range: 0 - 22 mm/hr 76 (H) 69 (H)  Results for Tasha, [name] (MRN [id number]) as of 02/19/2018 10:01  Ref. Range 12/17/2017 18:59  Speckled Pattern Unknown 1:320 (H)  Results for Tasha, [name] (MRN [id number]) as of 02/19/2018 10:01  Ref. Range 12/19/2017 14:15  Color, Fluid Latest Ref Range: YELLOW  COLORLESS (A)  WBC, Fluid Latest Ref Range: 0 -  1,000 cu mm 93  Lymphs, Fluid Latest Units: % 78  Eos, Fluid Latest Units: % 2  Appearance, Fluid Latest Ref Range: CLEAR  CLEAR (A)  Neutrophil Count, Fluid Latest Ref Range: 0 - 25 % 7  Monocyte-Macrophage-Serous Fluid Latest Ref Range: 50 - 90 % 13 (L)    ROS - per HPI     has a past medical history of Acute respiratory failure ([place]), CAP (community acquired pneumonia), Hyperlipidemia, Hypertension, and Metabolic syndrome.   reports that  she has never smoked. She has never used smokeless tobacco.  Past Surgical History:  Procedure Laterality Date  . CESAREAN SECTION      Allergies  Allergen Reactions  . Homatropine     Pt doesn't remember  . Hydromet [Hydrocodone-Homatropine]     Pt doesn't remember  . Amoxicillin Rash    Immunization History  Administered Date(s) Administered  . Influenza, High Dose Seasonal PF 03/11/2017  . Influenza,inj,Quad PF,6+ Mos 02/04/2014, 02/09/2015, 02/20/2016  . Pneumococcal Conjugate-13 08/10/2015  . Pneumococcal Polysaccharide-23 06/17/2017, 12/21/2017    Family History  Problem Relation Age of Onset  . Cancer Father   . Healthy Brother   . Healthy Daughter   . Healthy Brother      Current Outpatient Medications:  .  acetaminophen (TYLENOL) 325 MG tablet, Take 1-2 tablets (325-650 mg total) by mouth every 4 (four) hours as needed for mild pain., Disp: , Rfl:  .  benzonatate (TESSALON PERLES) 100 MG capsule, Take 1 capsule (100 mg total) by mouth 2 (two) times daily., Disp: 30 capsule, Rfl: 1 .  carvedilol (COREG) 3.125 MG tablet, Take 1 tablet (3.125 mg total) by mouth 2 (two) times daily with a meal., Disp: 60 tablet, Rfl: 0 .  cetirizine (ZYRTEC) 10 MG tablet, Take 1 tablet (10 mg total) by mouth daily as needed for allergies., Disp: , Rfl:  .  Ipratropium-Albuterol (COMBIVENT RESPIMAT) 20-100 MCG/ACT AERS respimat, Inhale 1 puff into the lungs every 6 (six) hours as needed for wheezing., Disp: 1 Inhaler, Rfl: 0 .  levothyroxine (SYNTHROID, LEVOTHROID) 50 MCG tablet, Take 1 tablet (50 mcg total) by mouth daily before breakfast., Disp: 90 tablet, Rfl: 1 .  losartan (COZAAR) 25 MG tablet, Take 0.5 tablets (12.5 mg total) by mouth 2 (two) times daily., Disp: 90 tablet, Rfl: 1 .  pantoprazole (PROTONIX) 40 MG tablet, Take 1 tablet (40 mg total) by mouth daily., Disp: 30 tablet, Rfl: 0 .  sodium chloride (OCEAN) 0.65 % SOLN nasal spray, Place 1 spray into both nostrils 4 (four)  times daily -  with meals and at bedtime., Disp: , Rfl: 0      Objective:   Vitals:   02/19/18 0954  BP: 126/82  Pulse: 69  SpO2: 99%  Weight: 143 lb 12.8 oz (65.2 kg)  Height: _0  (1.549 m)    Estimated body mass index is 27.17 kg/m as calculated from the following:   Height as of this encounter: _1  (1.549 m).   Weight as of this encounter: 143 lb 12.8 oz (65.2 kg).  _2 @  Filed Weights   02/19/18 0954  Weight: 143 lb 12.8 oz (65.2 kg)     Physical Exam  General Appearance:    Alert, cooperative, no distress, appears stated age - older , Deconditioned looking - yes mild , OBESE  - no, Sitting on Wheelchair -  no  Head:    Normocephalic, without obvious abnormality, atraumatic  Eyes:    PERRL, conjunctiva/corneas clear,  Ears:    Normal TM's and external ear canals, both ears  Nose:   Nares normal, septum midline, mucosa normal, no drainage    or sinus tenderness. OXYGEN ON  - no . Patient is @ ra   Throat:   Lips, mucosa, and tongue normal; teeth and gums normal. Cyanosis on lips - no  Neck:   Supple, symmetrical, trachea midline, no adenopathy;    thyroid:  no enlargement/tenderness/nodules; no carotid   bruit or JVD  Back:     Symmetric, no curvature, ROM normal, no CVA tenderness  Lungs:     Distress - no , Wheeze no, Barrell Chest - no, Purse lip breathing - no, Crackles - yes at base   Chest Wall:    No tenderness or deformity.    Heart:    Regular rate and rhythm, S1 and S2 normal, no rub   or gallop, Murmur - no  Breast Exam:    NOT DONE  Abdomen:     Soft, non-tender, bowel sounds active all four quadrants,    no masses, no organomegaly. Visceral obesity - mild yes  Genitalia:   NOT DONE  Rectal:   NOT DONE  Extremities:   Extremities - normal, Has Cane - no, Clubbing - no, Edema - no  Pulses:   2+ and symmetric all extremities  Skin:   Stigmata of Connective Tissue Disease - no  Lymph nodes:   Cervical, supraclavicular, and axillary nodes  normal  Psychiatric:  Neurologic:   Pleasant - yes, Anxious - no, Flat affect - yes  CAm-ICU - neg, Alert and Oriented x 3 - yes, Moves all 4s - yes, Speech - normal, Cognition - intact           Assessment:       ICD-10-CM   1. BOOP (bronchiolitis obliterans with organizing pneumonia) ([place]) J84.89 Sed Rate (ESR)    ANA w/Reflex    Angi-Jo 1 antibody, IgG    Anti-DNA antibody, double-stranded    CT Chest High Resolution  2. Shortness of breath R06.02        Plan:     Patient Instructions     ICD-10-CM   1. BOOP (bronchiolitis obliterans with organizing pneumonia) ([place]) J84.89     Improved but might be getting worse without steroids ; based on cough getting worse   PLAN - check ESR, ANA, DS-DNA, anti-JO 1 - can do this 02/19/2018 - do HRCT in 2-3 weeks from now (do not do it earlier)  Followup - 2-3 weeks in ILD clinic with myself or Dr Vaughan Browner or APP - based on results might need repet bronch/bal or extended steroids   > 50% of this > 25 min visit spent in face to face counseling or coordination of care - by this undersigned MD - Dr Brand Males. This includes one or more of the following documented above: discussion of test results, diagnostic or treatment recommendations, prognosis, risks and benefits of management options, instructions, education, compliance or risk-factor reduction   SIGNATURE    Dr. Brand Males, M.D., F.C.C.P,  Pulmonary and Critical Care Medicine Staff Physician, [place] Director - Interstitial Lung Disease  Program  Pulmonary [place] at [place], [place], [address]  Pager: [phone], If no answer or between  15:00h - 7:00h: call [phone] Telephone: [phone]  10:37 AM 02/19/2018

## 2018-02-19 NOTE — Addendum Note (Signed)
Addended by: Vivia Ewing on: 02/19/2018 11:04 AM   Modules accepted: Orders

## 2018-02-19 NOTE — Progress Notes (Unsigned)
Anti-jo

## 2018-02-19 NOTE — Patient Instructions (Addendum)
ICD-10-CM   1. BOOP (bronchiolitis obliterans with organizing pneumonia) ([place]) J84.89     Improved but might be getting worse without steroids ; based on cough getting worse   PLAN - check ESR, ANA, DS-DNA, anti-JO 1 - can do this 02/19/2018 - do HRCT in 2-3 weeks from now (do not do it earlier)  Followup - 2-3 weeks in ILD clinic with myself or Dr Vaughan Browner or APP - based on results might need repet bronch/bal or extended steroids

## 2018-02-20 LAB — ANA W/REFLEX: ANA: NEGATIVE

## 2018-02-20 LAB — ANTI-JO 1 ANTIBODY, IGG: Anti JO-1: <0.2 AI (ref 0.0–0.9)

## 2018-02-20 LAB — ANTI-DNA ANTIBODY, DOUBLE-STRANDED: ds DNA Ab: <1 IU/mL

## 2018-02-23 ENCOUNTER — Telehealth: Payer: Self-pay | Admitting: *Deleted

## 2018-02-23 DIAGNOSIS — J45998 Other asthma: Secondary | ICD-10-CM | POA: Diagnosis not present

## 2018-02-23 DIAGNOSIS — D696 Thrombocytopenia, unspecified: Secondary | ICD-10-CM | POA: Diagnosis not present

## 2018-02-23 DIAGNOSIS — J84116 Cryptogenic organizing pneumonia: Secondary | ICD-10-CM | POA: Diagnosis not present

## 2018-02-23 DIAGNOSIS — J849 Interstitial pulmonary disease, unspecified: Secondary | ICD-10-CM | POA: Diagnosis not present

## 2018-02-23 DIAGNOSIS — I481 Persistent atrial fibrillation: Secondary | ICD-10-CM | POA: Diagnosis not present

## 2018-02-23 DIAGNOSIS — I10 Essential (primary) hypertension: Secondary | ICD-10-CM | POA: Diagnosis not present

## 2018-02-23 NOTE — Telephone Encounter (Signed)
TC from [facility] w/ [facility] Pt's BP today was 160/90 after taking her meds No edema, HA every now & then Having dry cough, recommended asking her pharmacist at [facility] for OTC cough med that will not interfere with her BP meds. Pulmonologist put her on Tessalon Perles last week.

## 2018-02-23 NOTE — Telephone Encounter (Signed)
Continue to monitor it for now, make sure to check in at least 30 minutes after she is taking her medications and give [place] some more numbers if it continues to be elevated.  Tessalon Perles can help but if she does not feel like they are helping that she can take Robitussin just make sure she does not have anything with the DM

## 2018-02-23 NOTE — Telephone Encounter (Signed)
Angie aware and verbalizes understanding.  

## 2018-02-27 ENCOUNTER — Encounter: Payer: Self-pay | Admitting: Cardiovascular Disease

## 2018-02-27 ENCOUNTER — Encounter: Payer: Self-pay | Admitting: *Deleted

## 2018-02-27 ENCOUNTER — Ambulatory Visit (INDEPENDENT_AMBULATORY_CARE_PROVIDER_SITE_OTHER): Payer: Medicare Other | Admitting: Cardiovascular Disease

## 2018-02-27 VITALS — BP 180/102 | HR 79 | Ht 61.0 in | Wt 143.0 lb

## 2018-02-27 DIAGNOSIS — I480 Paroxysmal atrial fibrillation: Secondary | ICD-10-CM

## 2018-02-27 DIAGNOSIS — R079 Chest pain, unspecified: Secondary | ICD-10-CM | POA: Diagnosis not present

## 2018-02-27 DIAGNOSIS — I2584 Coronary atherosclerosis due to calcified coronary lesion: Secondary | ICD-10-CM | POA: Diagnosis not present

## 2018-02-27 DIAGNOSIS — I2510 Atherosclerotic heart disease of native coronary artery without angina pectoris: Secondary | ICD-10-CM

## 2018-02-27 DIAGNOSIS — I38 Endocarditis, valve unspecified: Secondary | ICD-10-CM

## 2018-02-27 DIAGNOSIS — I071 Rheumatic tricuspid insufficiency: Secondary | ICD-10-CM | POA: Diagnosis not present

## 2018-02-27 DIAGNOSIS — I10 Essential (primary) hypertension: Secondary | ICD-10-CM

## 2018-02-27 MED ORDER — ASPIRIN EC 81 MG PO TBEC: 81.0000 mg | DELAYED_RELEASE_TABLET | Freq: Every day | ORAL | 3 refills | Status: DC

## 2018-02-27 NOTE — Patient Instructions (Signed)
Medication Instructions:  Your physician recommends that you continue on your current medications as directed. Please refer to the Current Medication list given to you today.  Start Asprin 81 mg Daily   If you need a refill on your cardiac medications before your next appointment, please call your pharmacy.   Lab work: NONE   If you have labs (blood work) drawn today and your tests are completely normal, you will receive your results only by: Marland Kitchen MyChart Message (if you have MyChart) OR . A paper copy in the mail If you have any lab test that is abnormal or we need to change your treatment, we will call you to review the results.  Testing/Procedures: Your physician has requested that you have a lexiscan myoview. For further information please visit [email or web address]. Please follow instruction sheet, as given.  Your physician has recommended that you wear an event monitor. Event monitors are medical devices that record the heart's electrical activity. Doctors most often [place] these monitors to diagnose arrhythmias. Arrhythmias are problems with the speed or rhythm of the heartbeat. The monitor is a small, portable device. You can wear one while you do your normal daily activities. This is usually used to diagnose what is causing palpitations/syncope (passing out).    Follow-Up: At [facility], you and your health needs are our priority.  As part of our continuing mission to provide you with exceptional heart care, we have created designated Provider Care Teams.  These Care Teams include your primary Cardiologist (physician) and Advanced Practice Providers (APPs -  Physician Assistants and Nurse Practitioners) who all work together to provide you with the care you need, when you need it. You will need a follow up appointment in 3 months.  Please call our office 2 months in advance to schedule this appointment.  You may see Kate Sable, MD or one of the following Advanced Practice  Providers on your designated Care Team:   Bernerd Pho, PA-C [facility]) . Ermalinda Barrios, PA-C ([facility])  Any Other Special Instructions Will Be Listed Below (If Applicable). Thank you for choosing [facility]!

## 2018-02-27 NOTE — Progress Notes (Signed)
CARDIOLOGY CONSULT NOTE  [name] MRN: [id number] DOB/AGE: 67-Aug-1952 67 y.o.  Admit date: (Not on file) Primary Physician: Dettinger, Fransisca Kaufmann, MD Referring Physician: Dettinger, Fransisca Kaufmann, MD  Reason for Consultation: Coronary atherosclerosis and paroxysmal atrial fibrillation  HPI: Tasha [name] is a 67 y.o. female who is being seen today for the evaluation of coronary atherosclerosis and paroxysmal atrial fibrillation at the request of Dettinger, Fransisca Kaufmann, MD.   Past medical history also includes hypertension, metabolic syndrome, interstitial lung disease and BOOP.  I reviewed a high-resolution chest CT dated 12/19/2017 which showed left anterior descending and right coronary atherosclerosis.  There was also trace pericardial effusion/thickening.  I reviewed the echocardiogram dated 12/18/2017 which demonstrated vigorous left ventricular systolic function, LVEF 65 to 70%, normal regional wall motion, grade 1 diastolic dysfunction, mild mitral and severe tricuspid regurgitation, moderately increased pulmonary pressures 54 mmHg.  I personally reviewed the ECG performed on 12/28/2017 which demonstrated normal sinus rhythm with no ischemic ST segment or T wave abnormalities, nor any arrhythmias.  I reviewed another ECG performed on 12/21/2017 which demonstrated rapid atrial fibrillation, 132 bpm.  She has been hospitalized several times.  When she was hospitalized in early September for ARDS requiring intubation, she was noted to have a brief episode of rapid atrial fibrillation.  It resolved after 48 hours of amiodarone.  Internal medicine discussed this with Dr. Oval Linsey (cardiology) who suggested a low-dose beta-blocker and aspirin and a 30-day event monitor.  It was felt that the episode was likely due to acute pulmonary stress.  She is here with her daughter-in-law and a Patent attorney.  She does complain of palpitations which primarily occur at night.  She  also describes retrosternal chest pains occurring both at rest and with exertion.  She is no longer taking aspirin.  She is bothered by a cough.  TSH noted to be elevated at 12.078 on 12/28/2017 and subsequently 7.36 on 01/21/2018.  Hemoglobin was 11 and platelets were 139 on 01/21/2018.     Allergies  Allergen Reactions  . Homatropine     Pt doesn't remember  . Hydromet [Hydrocodone-Homatropine]     Pt doesn't remember  . Amoxicillin Rash    Current Outpatient Medications  Medication Sig Dispense Refill  . acetaminophen (TYLENOL) 325 MG tablet Take 1-2 tablets (325-650 mg total) by mouth every 4 (four) hours as needed for mild pain.    . benzonatate (TESSALON PERLES) 100 MG capsule Take 1 capsule (100 mg total) by mouth 2 (two) times daily. 30 capsule 1  . carvedilol (COREG) 3.125 MG tablet Take 1 tablet (3.125 mg total) by mouth 2 (two) times daily with a meal. 60 tablet 0  . cetirizine (ZYRTEC) 10 MG tablet Take 1 tablet (10 mg total) by mouth daily as needed for allergies.    . Ipratropium-Albuterol (COMBIVENT RESPIMAT) 20-100 MCG/ACT AERS respimat Inhale 1 puff into the lungs every 6 (six) hours as needed for wheezing. 1 Inhaler 0  . levothyroxine (SYNTHROID, LEVOTHROID) 50 MCG tablet Take 1 tablet (50 mcg total) by mouth daily before breakfast. 90 tablet 1  . losartan (COZAAR) 25 MG tablet Take 0.5 tablets (12.5 mg total) by mouth 2 (two) times daily. 90 tablet 1  . pantoprazole (PROTONIX) 40 MG tablet Take 1 tablet (40 mg total) by mouth daily. 30 tablet 0  . sodium chloride (OCEAN) 0.65 % SOLN nasal spray Place 1 spray into both nostrils 4 (four) times daily -  with  meals and at bedtime.  0   No current facility-administered medications for this visit.     Past Medical History:  Diagnosis Date  . Acute respiratory failure ([place])   . CAP (community acquired pneumonia)   . Hyperlipidemia   . Hypertension   . Metabolic syndrome     Past Surgical History:  Procedure Laterality  Date  . CESAREAN SECTION      Social History   Socioeconomic History  . Marital status: Married    Spouse name: Not on file  . Number of children: Not on file  . Years of education: Not on file  . Highest education level: Not on file  Occupational History  . Not on file  Social Needs  . Financial resource strain: Not hard at all  . Food insecurity:    Worry: Never true    Inability: Never true  . Transportation needs:    Medical: No    Non-medical: No  Tobacco Use  . Smoking status: Never Smoker  . Smokeless tobacco: Never Used  Substance and Sexual Activity  . Alcohol use: No  . Drug use: No  . Sexual activity: Not Currently    Birth control/protection: Post-menopausal  Lifestyle  . Physical activity:    Days per week: 7 days    Minutes per session: 30 min  . Stress: Only a little  Relationships  . Social connections:    Talks on phone: Never    Gets together: Never    Attends religious service: More than 4 times per year    Active member of club or organization: Yes    Attends meetings of clubs or organizations: More than 4 times per year    Relationship status: Married  . Intimate partner violence:    Fear of current or ex partner: No    Emotionally abused: No    Physically abused: No    Forced sexual activity: No  Other Topics Concern  . Not on file  Social History Narrative  . Not on file     No family history of premature CAD in 1st degree relatives.  Current Meds  Medication Sig  . acetaminophen (TYLENOL) 325 MG tablet Take 1-2 tablets (325-650 mg total) by mouth every 4 (four) hours as needed for mild pain.  . benzonatate (TESSALON PERLES) 100 MG capsule Take 1 capsule (100 mg total) by mouth 2 (two) times daily.  . carvedilol (COREG) 3.125 MG tablet Take 1 tablet (3.125 mg total) by mouth 2 (two) times daily with a meal.  . cetirizine (ZYRTEC) 10 MG tablet Take 1 tablet (10 mg total) by mouth daily as needed for allergies.  .  Ipratropium-Albuterol (COMBIVENT RESPIMAT) 20-100 MCG/ACT AERS respimat Inhale 1 puff into the lungs every 6 (six) hours as needed for wheezing.  Marland Kitchen levothyroxine (SYNTHROID, LEVOTHROID) 50 MCG tablet Take 1 tablet (50 mcg total) by mouth daily before breakfast.  . losartan (COZAAR) 25 MG tablet Take 0.5 tablets (12.5 mg total) by mouth 2 (two) times daily.  . pantoprazole (PROTONIX) 40 MG tablet Take 1 tablet (40 mg total) by mouth daily.  . sodium chloride (OCEAN) 0.65 % SOLN nasal spray Place 1 spray into both nostrils 4 (four) times daily -  with meals and at bedtime.      Review of systems complete and found to be negative unless listed above in HPI    Physical exam Blood pressure (!) 180/102, pulse 79, height 5\' 1"  (1.549 m), weight 143 lb (64.9 kg),  SpO2 99 %. General: NAD Neck: No JVD, no thyromegaly or thyroid nodule.  Lungs: Clear to auscultation bilaterally with normal respiratory effort. CV: Nondisplaced PMI. Regular rate and rhythm, normal S1/S2, no S3/S4, no murmur.  No peripheral edema.  No carotid bruit.    Abdomen: Soft, nontender, no distention.  Skin: Intact without lesions or rashes.  Neurologic: Alert and oriented x 3.  Psych: Normal affect. Extremities: No clubbing or cyanosis.  HEENT: Normal.   ECG: Most recent ECG reviewed.   Labs: Lab Results  Component Value Date/Time   K 4.7 01/21/2018 12:48 PM   BUN 18 01/21/2018 12:48 PM   CREATININE 0.87 01/21/2018 12:48 PM   CREATININE 0.70 09/23/2012 01:35 PM   ALT 26 01/21/2018 12:48 PM   TSH 13.560 (H) 02/16/2018 11:39 AM   HGB 11.0 (L) 01/21/2018 12:48 PM     Lipids: Lab Results  Component Value Date/Time   LDLCALC 50 06/17/2017 10:19 AM   LDLCALC 118 (H) 05/04/2013 03:28 PM   LDLCALC 116 (H) 09/23/2012 01:35 PM   LDLDIRECT 77 07/11/2016 03:36 PM   CHOL 111 06/17/2017 10:19 AM   CHOL 180 09/23/2012 01:35 PM   TRIG 165 (H) 12/21/2017 02:57 AM   TRIG 108 08/10/2014 08:52 AM   TRIG 135 09/23/2012 01:35  PM   HDL 36 (L) 06/17/2017 10:19 AM   HDL 40 08/10/2014 08:52 AM   HDL 37 (L) 09/23/2012 01:35 PM        ASSESSMENT AND PLAN:   1.  Coronary artery atherosclerosis/chest pain: Unclear etiology of symptoms with both typical and atypical features.  Coronary atherosclerosis noted on chest CT as detailed above. I will proceed with a nuclear myocardial perfusion imaging study to evaluate for ischemic heart disease (Lexiscan Myoview).  2.  Accelerated hypertension: Blood pressure significantly elevated.  Currently on carvedilol 3.125 mg twice daily and low-dose losartan 12.5 mg twice daily.  This will need further monitoring.  3.  Valvular heart disease: Mild mitral and severe tricuspid regurgitation.  Severe tricuspid regurgitation with moderately elevated pulmonary pressures likely secondary to interstitial lung disease.  4.  Paroxysmal atrial fibrillation: Currently on carvedilol.  She does have episodic palpitations.  Left atrium normal in size by echocardiogram.  An aspirin was recommended which she is no longer taking and I encouraged her to begin again.  I will obtain a 30-day event monitor.  It is possible that the episode which occurred while hospitalized was due to acute pulmonary stress as noted above.    Disposition: Follow up in 3 months  Signed: Kate Sable, M.D., F.A.C.C.  02/27/2018, 1:41 PM

## 2018-03-06 ENCOUNTER — Ambulatory Visit (INDEPENDENT_AMBULATORY_CARE_PROVIDER_SITE_OTHER): Payer: Medicare Other

## 2018-03-06 DIAGNOSIS — I4891 Unspecified atrial fibrillation: Secondary | ICD-10-CM | POA: Diagnosis not present

## 2018-03-06 DIAGNOSIS — I480 Paroxysmal atrial fibrillation: Secondary | ICD-10-CM | POA: Diagnosis not present

## 2018-03-09 ENCOUNTER — Encounter (HOSPITAL_COMMUNITY)
Admission: RE | Admit: 2018-03-09 | Discharge: 2018-03-09 | Disposition: A | Discharge status: Home / Self Care | Payer: Medicare Other | Source: Ambulatory Visit | Attending: Cardiovascular Disease | Admitting: Cardiovascular Disease

## 2018-03-09 ENCOUNTER — Encounter (HOSPITAL_BASED_OUTPATIENT_CLINIC_OR_DEPARTMENT_OTHER)
Admission: RE | Admit: 2018-03-09 | Discharge: 2018-03-09 | Disposition: A | Discharge status: Home / Self Care | Payer: Medicare Other | Source: Ambulatory Visit | Attending: Cardiovascular Disease | Admitting: Cardiovascular Disease

## 2018-03-09 ENCOUNTER — Ambulatory Visit (INDEPENDENT_AMBULATORY_CARE_PROVIDER_SITE_OTHER): Payer: Medicare Other

## 2018-03-09 DIAGNOSIS — Z23 Encounter for immunization: Secondary | ICD-10-CM

## 2018-03-09 DIAGNOSIS — R079 Chest pain, unspecified: Secondary | ICD-10-CM

## 2018-03-09 LAB — NM MYOCAR MULTI W/SPECT W/WALL MOTION / EF
CHL CUP NUCLEAR SDS: 0
CHL CUP NUCLEAR SRS: 1
CHL CUP RESTING HR STRESS: 80 {beats}/min
CSEPPHR: 105 {beats}/min
LV dias vol: 48 mL (ref 46–106)
LV sys vol: 18 mL
RATE: 0.36
SSS: 1
TID: 1.05

## 2018-03-09 MED ORDER — REGADENOSON 0.4 MG/5ML IV SOLN
INTRAVENOUS | Status: AC
  Administered 2018-03-09: 0.4 mg via INTRAVENOUS
  Filled 2018-03-09: qty 5

## 2018-03-09 MED ORDER — TECHNETIUM TC 99M TETROFOSMIN IV KIT
30.0000 | PACK | Freq: Once | INTRAVENOUS | Status: AC | PRN
  Administered 2018-03-09: 31 via INTRAVENOUS

## 2018-03-09 MED ORDER — TECHNETIUM TC 99M TETROFOSMIN IV KIT
10.0000 | PACK | Freq: Once | INTRAVENOUS | Status: AC | PRN
  Administered 2018-03-09: 10 via INTRAVENOUS

## 2018-03-09 MED ORDER — SODIUM CHLORIDE 0.9% FLUSH
INTRAVENOUS | Status: AC
  Administered 2018-03-09: 10 mL via INTRAVENOUS
  Filled 2018-03-09: qty 10

## 2018-03-11 ENCOUNTER — Ambulatory Visit (HOSPITAL_COMMUNITY)
Admission: RE | Admit: 2018-03-11 | Discharge: 2018-03-11 | Disposition: A | Discharge status: Home / Self Care | Payer: Medicare Other | Source: Ambulatory Visit | Attending: Internal Medicine | Admitting: Internal Medicine

## 2018-03-11 ENCOUNTER — Telehealth: Payer: Self-pay | Admitting: Cardiovascular Disease

## 2018-03-11 DIAGNOSIS — I2510 Atherosclerotic heart disease of native coronary artery without angina pectoris: Secondary | ICD-10-CM | POA: Diagnosis not present

## 2018-03-11 DIAGNOSIS — J479 Bronchiectasis, uncomplicated: Secondary | ICD-10-CM | POA: Insufficient documentation

## 2018-03-11 DIAGNOSIS — J8489 Other specified interstitial pulmonary diseases: Secondary | ICD-10-CM | POA: Insufficient documentation

## 2018-03-11 DIAGNOSIS — I700 Atherosclerosis of aorta: Secondary | ICD-10-CM | POA: Insufficient documentation

## 2018-03-11 DIAGNOSIS — J84112 Idiopathic pulmonary fibrosis: Secondary | ICD-10-CM | POA: Diagnosis not present

## 2018-03-11 NOTE — Telephone Encounter (Signed)
Test needs to be resulted, will send to [facility] PA-C

## 2018-03-11 NOTE — Telephone Encounter (Signed)
Pt's daughter Brayton Layman called wanting to know results from stress test, please call [phone]

## 2018-03-11 NOTE — Progress Notes (Signed)
Called and gave results through [employer] Pt verbalized understanding

## 2018-03-13 ENCOUNTER — Telehealth: Payer: Self-pay | Admitting: Cardiovascular Disease

## 2018-03-16 ENCOUNTER — Encounter: Payer: Self-pay | Admitting: Family Medicine

## 2018-03-16 ENCOUNTER — Ambulatory Visit (INDEPENDENT_AMBULATORY_CARE_PROVIDER_SITE_OTHER): Payer: Medicare Other | Admitting: Family Medicine

## 2018-03-16 VITALS — BP 194/95 | HR 98 | Temp 97.0°F | Ht 61.0 in | Wt 141.0 lb

## 2018-03-16 DIAGNOSIS — I2510 Atherosclerotic heart disease of native coronary artery without angina pectoris: Secondary | ICD-10-CM | POA: Diagnosis not present

## 2018-03-16 DIAGNOSIS — G518 Other disorders of facial nerve: Secondary | ICD-10-CM | POA: Diagnosis not present

## 2018-03-16 DIAGNOSIS — R51 Headache: Secondary | ICD-10-CM

## 2018-03-16 DIAGNOSIS — R6889 Other general symptoms and signs: Secondary | ICD-10-CM | POA: Diagnosis not present

## 2018-03-16 DIAGNOSIS — I2584 Coronary atherosclerosis due to calcified coronary lesion: Secondary | ICD-10-CM | POA: Diagnosis not present

## 2018-03-16 DIAGNOSIS — R519 Headache, unspecified: Secondary | ICD-10-CM

## 2018-03-16 DIAGNOSIS — I10 Essential (primary) hypertension: Secondary | ICD-10-CM | POA: Diagnosis not present

## 2018-03-16 DIAGNOSIS — E039 Hypothyroidism, unspecified: Secondary | ICD-10-CM | POA: Diagnosis not present

## 2018-03-16 MED ORDER — AMLODIPINE BESYLATE 2.5 MG PO TABS: 2.5000 mg | ORAL_TABLET | Freq: Every day | ORAL | 3 refills | Status: DC

## 2018-03-16 NOTE — Telephone Encounter (Signed)
I have forwarded the report to you

## 2018-03-16 NOTE — Progress Notes (Signed)
BP (!) 194/95   Pulse 98   Temp (!) 97 F (36.1 C) (Oral)   Ht 5\' 1"  (1.549 m)   Wt 141 lb (64 kg)   BMI 26.64 kg/m    Subjective:    [name], female    DOB: 01-11-1951, 67 y.o.   MRN: [id number]  HPI: Tasha [name] is a 67 y.o. female presenting on 03/16/2018 for Hypertension (4 week follow up) and Headache   HPI Hypertension Patient is currently on amlodipine and carvedilol and losartan, the amlodipine is currently at 2.5 mg and we just started that 4 weeks ago, and their blood pressure today is 194/95 and 170/90 which is actually improvement from last time.. Patient denies any lightheadedness or dizziness. Patient denies blurred vision, chest pains, shortness of breath, or weakness. Denies any side effects from medication and is content with current medication.  Patient does complain of pressure and pulsating headaches in the top of her head at times when her blood pressures running up currently.  Hypothyroidism recheck Patient is coming in for thyroid recheck today as well. They deny any issues with hair changes or heat or cold problems or diarrhea or constipation. They deny any chest pain or palpitations. They are currently on levothyroxine 47micrograms   Headache and right facial nerve pain Patient complains of a headache on the top of her head and pressure that she also complains of a burning pain that comes from her right jawline up into the right side of her cheek near her nose.  She says that this been going on for at least a few weeks and she cannot recall any specific incident that brought it upon her.  She denies any blurred vision or focal numbness or weakness or pain anywhere else besides her head.  She denies any dental pain or ear pain on that side  Relevant past medical, surgical, family and social history reviewed and updated as indicated. Interim medical history since our last visit reviewed. Allergies and medications reviewed and  updated.  Review of Systems  Constitutional: Negative for chills and fever.  Eyes: Negative for visual disturbance.  Respiratory: Negative for chest tightness and shortness of breath.   Cardiovascular: Negative for chest pain and leg swelling.  Musculoskeletal: Negative for back pain and gait problem.  Skin: Negative for rash.  Neurological: Positive for headaches. Negative for weakness, light-headedness and numbness.  Psychiatric/Behavioral: Negative for agitation and behavioral problems.  All other systems reviewed and are negative.   Per HPI unless specifically indicated above   Allergies as of 03/16/2018      Reactions   Homatropine    Pt doesn't remember   Hydromet [hydrocodone-homatropine]    Pt doesn't remember   Amoxicillin Rash      Medication List        Accurate as of 03/16/18  2:33 PM. Always use your most recent med list.          acetaminophen 325 MG tablet Commonly known as:  TYLENOL Take 1-2 tablets (325-650 mg total) by mouth every 4 (four) hours as needed for mild pain.   aspirin EC 81 MG tablet Take 1 tablet (81 mg total) by mouth daily.   benzonatate 100 MG capsule Commonly known as:  TESSALON Take 1 capsule (100 mg total) by mouth 2 (two) times daily.   carvedilol 3.125 MG tablet Commonly known as:  COREG Take 1 tablet (3.125 mg total) by mouth 2 (two) times daily with a meal.  cetirizine 10 MG tablet Commonly known as:  ZYRTEC Take 1 tablet (10 mg total) by mouth daily as needed for allergies.   Ipratropium-Albuterol 20-100 MCG/ACT Aers respimat Commonly known as:  COMBIVENT Inhale 1 puff into the lungs every 6 (six) hours as needed for wheezing.   levothyroxine 50 MCG tablet Commonly known as:  SYNTHROID, LEVOTHROID Take 1 tablet (50 mcg total) by mouth daily before breakfast.   losartan 25 MG tablet Commonly known as:  COZAAR Take 0.5 tablets (12.5 mg total) by mouth 2 (two) times daily.   pantoprazole 40 MG tablet Commonly  known as:  PROTONIX Take 1 tablet (40 mg total) by mouth daily.   sodium chloride 0.65 % Soln nasal spray Commonly known as:  OCEAN Place 1 spray into both nostrils 4 (four) times daily -  with meals and at bedtime.          Objective:    BP (!) 194/95   Pulse 98   Temp (!) 97 F (36.1 C) (Oral)   Ht 5\' 1"  (1.549 m)   Wt 141 lb (64 kg)   BMI 26.64 kg/m   Wt Readings from Last 3 Encounters:  03/16/18 141 lb (64 kg)  02/27/18 143 lb (64.9 kg)  02/19/18 143 lb 12.8 oz (65.2 kg)    Physical Exam  Constitutional: She is oriented to person, place, and time. She appears well-developed and well-nourished. No distress.  Eyes: Pupils are equal, round, and reactive to light. Conjunctivae and EOM are normal.  Neck: Neck supple. No thyromegaly present.  Cardiovascular: Normal rate, regular rhythm, normal heart sounds and intact distal pulses.  No murmur heard. Pulmonary/Chest: Effort normal and breath sounds normal. No respiratory distress. She has no wheezes.  Musculoskeletal: Normal range of motion. She exhibits no edema or tenderness.  Lymphadenopathy:    She has no cervical adenopathy.  Neurological: She is alert and oriented to person, place, and time. She displays normal reflexes. No cranial nerve deficit or sensory deficit. She exhibits normal muscle tone. Coordination normal.  Skin: Skin is warm and dry. No rash noted. She is not diaphoretic.  Psychiatric: She has a normal mood and affect. Her behavior is normal.  Nursing note and vitals reviewed.       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  Counseling provided for all of the vaccine components No orders of the defined types were placed in this encounter.   Caryl Pina, MD [facility] 03/16/2018, 2:33 PM

## 2018-03-17 LAB — VITAMIN B12: Vitamin B-12: 1096 pg/mL (ref 232–1245)

## 2018-03-17 LAB — TSH: TSH: 2.69 u[IU]/mL (ref 0.450–4.500)

## 2018-03-18 ENCOUNTER — Encounter: Payer: Self-pay | Admitting: Family Medicine

## 2018-03-18 ENCOUNTER — Ambulatory Visit (INDEPENDENT_AMBULATORY_CARE_PROVIDER_SITE_OTHER): Payer: Medicare Other | Admitting: Family Medicine

## 2018-03-18 VITALS — BP 190/96 | HR 93 | Temp 96.9°F | Ht 61.0 in | Wt 142.0 lb

## 2018-03-18 DIAGNOSIS — I10 Essential (primary) hypertension: Secondary | ICD-10-CM

## 2018-03-18 DIAGNOSIS — I2510 Atherosclerotic heart disease of native coronary artery without angina pectoris: Secondary | ICD-10-CM | POA: Diagnosis not present

## 2018-03-18 DIAGNOSIS — I2584 Coronary atherosclerosis due to calcified coronary lesion: Secondary | ICD-10-CM | POA: Diagnosis not present

## 2018-03-18 DIAGNOSIS — R05 Cough: Secondary | ICD-10-CM | POA: Diagnosis not present

## 2018-03-18 DIAGNOSIS — R053 Chronic cough: Secondary | ICD-10-CM

## 2018-03-18 MED ORDER — BENZONATATE 100 MG PO CAPS: 100.0000 mg | ORAL_CAPSULE | Freq: Two times a day (BID) | ORAL | 1 refills | Status: DC

## 2018-03-18 MED ORDER — AMLODIPINE BESYLATE 5 MG PO TABS: 5.0000 mg | ORAL_TABLET | Freq: Every day | ORAL | 2 refills | Status: DC

## 2018-03-18 NOTE — Addendum Note (Signed)
Addended by: Caryl Pina on: 03/18/2018 02:49 PM   Modules accepted: Orders

## 2018-03-18 NOTE — Progress Notes (Signed)
BP (!) 190/96   Pulse 93   Temp (!) 96.9 F (36.1 C) (Oral)   Ht 5' 1"  (1.549 m)   Wt 142 lb (64.4 kg)   BMI 26.83 kg/m    Subjective:    [name], female    DOB: 1950-12-25, 67 y.o.   MRN: [id number]  HPI: Tasha [name] is a 67 y.o. female presenting on 03/18/2018 for Hypertension (Patient was seen 11/25 and bp medication was changed and bp has been higher than it was.) and Headache (x 1 day)   HPI Hypertension Patient is currently on amlodipine and losartan and carvedilol, she used to be on hydrochlorothiazide but would get dehydrated and lightheaded and dizzy on it so they stopped that one and now we are on this current regiment, the amlodipine was new 2 days ago., and their blood pressure today is 179/89 and 190/96. Patient denies any lightheadedness or dizziness. Patient denies headaches, blurred vision, chest pains, shortness of breath, or weakness. Denies any side effects from medication and is content with current medication.   Relevant past medical, surgical, family and social history reviewed and updated as indicated. Interim medical history since our last visit reviewed. Allergies and medications reviewed and updated.  Review of Systems  Constitutional: Negative for chills and fever.  Eyes: Negative for redness and visual disturbance.  Respiratory: Negative for chest tightness and shortness of breath.   Cardiovascular: Negative for chest pain and leg swelling.  Musculoskeletal: Negative for back pain and gait problem.  Skin: Negative for rash.  Neurological: Positive for headaches. Negative for dizziness and light-headedness.  Psychiatric/Behavioral: Negative for agitation and behavioral problems.  All other systems reviewed and are negative.   Per HPI unless specifically indicated above   Allergies as of 03/18/2018      Reactions   Homatropine    Pt doesn't remember   Hydromet [hydrocodone-homatropine]    Pt doesn't remember   Amoxicillin  Rash      Medication List        Accurate as of 03/18/18  2:42 PM. Always use your most recent med list.          acetaminophen 325 MG tablet Commonly known as:  TYLENOL Take 1-2 tablets (325-650 mg total) by mouth every 4 (four) hours as needed for mild pain.   amLODipine 5 MG tablet Commonly known as:  NORVASC Take 1 tablet (5 mg total) by mouth at bedtime.   aspirin EC 81 MG tablet Take 1 tablet (81 mg total) by mouth daily.   benzonatate 100 MG capsule Commonly known as:  TESSALON Take 1 capsule (100 mg total) by mouth 2 (two) times daily.   carvedilol 3.125 MG tablet Commonly known as:  COREG Take 1 tablet (3.125 mg total) by mouth 2 (two) times daily with a meal.   cetirizine 10 MG tablet Commonly known as:  ZYRTEC Take 1 tablet (10 mg total) by mouth daily as needed for allergies.   Ipratropium-Albuterol 20-100 MCG/ACT Aers respimat Commonly known as:  COMBIVENT Inhale 1 puff into the lungs every 6 (six) hours as needed for wheezing.   levothyroxine 50 MCG tablet Commonly known as:  SYNTHROID, LEVOTHROID Take 1 tablet (50 mcg total) by mouth daily before breakfast.   losartan 25 MG tablet Commonly known as:  COZAAR Take 0.5 tablets (12.5 mg total) by mouth 2 (two) times daily.   pantoprazole 40 MG tablet Commonly known as:  PROTONIX Take 1 tablet (40 mg total) by mouth daily.  sodium chloride 0.65 % Soln nasal spray Commonly known as:  OCEAN Place 1 spray into both nostrils 4 (four) times daily -  with meals and at bedtime.          Objective:    BP (!) 190/96   Pulse 93   Temp (!) 96.9 F (36.1 C) (Oral)   Ht 5' 1"  (1.549 m)   Wt 142 lb (64.4 kg)   BMI 26.83 kg/m   Wt Readings from Last 3 Encounters:  03/18/18 142 lb (64.4 kg)  03/16/18 141 lb (64 kg)  02/27/18 143 lb (64.9 kg)    Physical Exam  Constitutional: She is oriented to person, place, and time. She appears well-developed and well-nourished. No distress.  Eyes: Conjunctivae  are normal.  Cardiovascular: Normal rate, regular rhythm, normal heart sounds and intact distal pulses.  No murmur heard. Pulmonary/Chest: Effort normal and breath sounds normal. No respiratory distress. She has no wheezes.  Musculoskeletal: Normal range of motion. She exhibits no edema.  Neurological: She is alert and oriented to person, place, and time. Coordination normal.  Skin: Skin is warm and dry. No rash noted. She is not diaphoretic.  Psychiatric: She has a normal mood and affect. Her behavior is normal.  Nursing note and vitals reviewed.   Results for orders placed or performed in visit on 03/16/18  TSH  Result Value Ref Range   TSH 2.690 0.450 - 4.500 uIU/mL  Vitamin B12  Result Value Ref Range   Vitamin B-12 1,096 232 - 1,245 pg/mL      Assessment & Plan:   Problem List Items Addressed This Visit      Cardiovascular and Mediastinum   Hypertension - Primary   Relevant Medications   amLODipine (NORVASC) 5 MG tablet   Other Relevant Orders   Lipid panel   CMP14+EGFR       Follow up plan: Return if symptoms worsen or fail to improve.  Counseling provided for all of the vaccine components Orders Placed This Encounter  Procedures  . Lipid panel  . [place], MD [facility] 03/18/2018, 2:42 PM

## 2018-03-19 LAB — LIPID PANEL
CHOLESTEROL TOTAL: 193 mg/dL (ref 100–199)
Chol/HDL Ratio: 6.9 ratio — ABNORMAL HIGH (ref 0.0–4.4)
HDL: 28 mg/dL — ABNORMAL LOW (ref >39)
LDL Calculated: 112 mg/dL — ABNORMAL HIGH (ref 0–99)
Triglycerides: 265 mg/dL — ABNORMAL HIGH (ref 0–149)
VLDL CHOLESTEROL CAL: 53 mg/dL — AB (ref 5–40)

## 2018-03-19 LAB — CMP14+EGFR
ALBUMIN: 4.2 g/dL (ref 3.6–4.8)
ALK PHOS: 55 IU/L (ref 39–117)
ALT: 38 IU/L — ABNORMAL HIGH (ref 0–32)
AST: 33 IU/L (ref 0–40)
Albumin/Globulin Ratio: 1.8 (ref 1.2–2.2)
BILIRUBIN TOTAL: 0.3 mg/dL (ref 0.0–1.2)
BUN / CREAT RATIO: 16 (ref 12–28)
BUN: 11 mg/dL (ref 8–27)
CHLORIDE: 106 mmol/L (ref 96–106)
CO2: 24 mmol/L (ref 20–29)
Calcium: 9.5 mg/dL (ref 8.7–10.3)
Creatinine, Ser: 0.68 mg/dL (ref 0.57–1.00)
GFR calc Af Amer: 105 mL/min/{1.73_m2} (ref >59)
GFR calc non Af Amer: 91 mL/min/{1.73_m2} (ref >59)
GLOBULIN, TOTAL: 2.3 g/dL (ref 1.5–4.5)
GLUCOSE: 83 mg/dL (ref 65–99)
Potassium: 3.9 mmol/L (ref 3.5–5.2)
SODIUM: 145 mmol/L — AB (ref 134–144)
Total Protein: 6.5 g/dL (ref 6.0–8.5)

## 2018-03-23 ENCOUNTER — Telehealth: Payer: Self-pay | Admitting: Family Medicine

## 2018-03-23 NOTE — Telephone Encounter (Signed)
Pt's daughter aware of results and recommendations.

## 2018-03-24 ENCOUNTER — Ambulatory Visit: Payer: Medicare Other | Admitting: Internal Medicine

## 2018-03-31 ENCOUNTER — Encounter: Payer: Self-pay | Admitting: Internal Medicine

## 2018-03-31 ENCOUNTER — Ambulatory Visit (INDEPENDENT_AMBULATORY_CARE_PROVIDER_SITE_OTHER): Payer: Medicare Other | Admitting: Internal Medicine

## 2018-03-31 VITALS — BP 146/88 | HR 88 | Ht 61.0 in | Wt 141.8 lb

## 2018-03-31 DIAGNOSIS — I2510 Atherosclerotic heart disease of native coronary artery without angina pectoris: Secondary | ICD-10-CM

## 2018-03-31 DIAGNOSIS — J849 Interstitial pulmonary disease, unspecified: Secondary | ICD-10-CM | POA: Diagnosis not present

## 2018-03-31 DIAGNOSIS — J8489 Other specified interstitial pulmonary diseases: Secondary | ICD-10-CM | POA: Diagnosis not present

## 2018-03-31 DIAGNOSIS — I2584 Coronary atherosclerosis due to calcified coronary lesion: Secondary | ICD-10-CM

## 2018-03-31 MED ORDER — PREDNISONE 10 MG PO TABS: ORAL_TABLET | ORAL | 0 refills | Status: DC

## 2018-03-31 NOTE — Patient Instructions (Addendum)
ICD-10-CM   1. BOOP (bronchiolitis obliterans with organizing pneumonia) ([place]) J84.89   2. Interstitial lung disease (H[facility]) J84.9     Significantly improved clinically and radiologically Residual cough might be because of residual inflammation present on CT scan November 2019  Plan -I think is a good idea to do 8 or 10 -week prednisone course -we discussed the side effects of prednisone extensively -Please start prednisone as follows 30 mg daily x2 weeks and then 20 mg daily x2 weeks and then 10 mg daily x2 weeks and then 5 mg daily x2 weeks and then 5 mg every other day x2 weeks and stop  Followup -Return in 4 weeks to see nurse practitioner to ensure that you are tolerating steroids well without side effects [interpreter needed for the visit]   -Return to see Dr. Chase Caller in ILD clinic in 10 weeks (do spirometry and DLCO ahead of the visit]  -We will decide timing of the CT scan at the time of follow-up   .................Marland Kitchen Mejora significativa clnica y radiolgicamente La tos residual puede deberse a la inflamacin residual presente en la tomografa computarizada de [place] de 2019  Plan -Creo que es una buena idea hacer 8 o 10 semanas de curso de prednisona. Discutimos ampliamente los efectos secundarios de la prednisona. -Inicie la prednisona de la siguiente manera 30 mg diarios x2 semanas y luego 20 mg diarios x2 semanas y luego 10 mg diarios x2 semanas y luego 5 mg diarios x2 Armed forces logistics/support/administrative officer y [employer] 5 mg [employer] x2 semanas y pare  Seguir -Vuelva en 4 semanas para ver a Freight forwarder practicante para asegurarse de que est tolerando bien los esteroides sin efectos secundarios [se necesita un intrprete para la visita]  -Vuelva a ver al Dr. Chase Caller en la clnica ILD en 10 semanas (haga espirometra y DLCO antes de la visita) -Decidiremos el momento de la tomografa computarizada en el momento del seguimiento

## 2018-03-31 NOTE — Progress Notes (Signed)
OV 01/22/18 67 year old female, never smoked. PMH  Hypertension, metabolic syndrome, thrombocytopenia, prediabetes, interstitial lung disease, PAF. Not previously followed by [facility] pulmonary care. Admitted to [facility] from outside hospital on 08/27 with acute hypoxic respiratory failure in setting of bilateral pulmonary infiltrates. Work up indicated [facility]. Dx subacute ILD vs pneumonia thought to be from exposure to antifreeze leak in patients car. Requiring intubation from 08/30-09/05, chest tube placed due to pneumothorax. [facility]- 09/20, required min assistance with mobility and basic self-care tasks. Speech therapy worked on dysphagia, able to tolerate regular diet thin liquids without signs and symptoms of aspiration.    Patient presents today for hospital follow-up visit. Accompanied by her daughter who also provides translation. She is feeling a lot better, no breathing issues. Finished oral steroid today. BS 119. CBC improving. Eating and drinking ok. No difficulties swallowing and denies aspiration. Ambulting with walker. Denies fall, sob, wheezing, cough, fever or chest pain.    Results for MILLIANNA, SZYMBORSKI (MRN [id number]) as of 02/19/2018 10:01  Ref. Range 12/16/2017 19:24 12/17/2017 18:59  Sed Rate Latest Ref Range: 0 - 22 mm/hr 76 (H) 69 (H)  Results for LUCIA, HARM (MRN [id number]) as of 02/19/2018 10:01  Ref. Range 12/17/2017 18:59  Speckled Pattern Unknown 1:320 (H)  Results for NIKOLETA, DADY (MRN [id number]) as of 02/19/2018 10:01  Ref. Range 12/19/2017 14:15  Color, Fluid Latest Ref Range: YELLOW  COLORLESS (A)  WBC, Fluid Latest Ref Range: 0 - 1,000 cu mm 93  Lymphs, Fluid Latest Units: % 78  Eos, Fluid Latest Units: % 2  Appearance, Fluid Latest Ref Range: CLEAR  CLEAR (A)  Neutrophil Count, Fluid Latest Ref Range: 0 - 25 % 7  Monocyte-Macrophage-Serous Fluid Latest Ref Range: 50 - 90 % 13 (L)     OV  02/19/2018  Subjective:  Patient ID: Talmage Nap, female , DOB: 05-08-1950 , age 8 y.o. , MRN: [id number] , ADDRESS: [address] [place] [address]   02/19/2018 -   Chief Complaint  Patient presents with  . Follow-up    States she still has dry cough, states the SOB has resolved. Denies chest pain or idscomfort.      HPI Aleynah Seabrooks 67 y.o. -accompanied by her daughter Verdis Frederickson.  End of August 2019 she suffered acute lung injury possibly Boop based on the nature of infiltrates and a high ESR and a positive ANA [BAL lymphocytosis].  There is some history of antifreeze exposure.  She has recovered from it and is now at home.  She is brought in by Dr. Verdis Frederickson.  Verdis Frederickson is doing the translation.  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  Post discharge in the hospital patient is off oxygen and is overall better.  Steroids ended 2 weeks ago but since the completion of steroids she feels the cough is coming back and getting worse.  Currently cough is moderate in intensity and without any sputum production.  2 days ago her primary care physician apparently changed 1 of her antihypertensives because of the cough.  I am unable to determine which one but I suspect it was an ACE inhibitor.  She is currently on losartan.  There is no fever or chills.  Current walking desaturation test appears adequate        ROS  OV 03/31/2018  Subjective:  Patient ID: Talmage Nap, female , DOB: [birth year] , age 76  y.o. , MRN: [id number] , ADDRESS: [place] [id number]   03/31/2018 -   Chief Complaint  Patient presents with  . Follow-up    pt states breathing is doing well. c/o non prod cough.     HPI Ariyah Wollard 67 y.o. -presents for interstitial lung disease acute with lymphocytic bronchoalveolar lavage after antifreeze exposure  After last visit she showed improvement.  We repeated ANA and this was negative.  We also did some  extra antibody such as anti-Jo 1 and this was negative.  Her ESR has normalized.  In the interim she continues to improve.  Shortness of breath is almost nonexistent.  Cough is improved but she still has mild residual cough.  She had high-resolution CT scan of the chest #2019 that I personally visualized and there is still some residual groundglass opacities along with some evolving fibrotic pattern.  It seems more predominant in the lung base.  Her her son Shellee Milo is with her.  History is gained from talking to interpreter on the video camera.  His name is Leanna Sato.  She denies any diabetes or hyperlipidemia or stroke in the past.  She does not have any mood swings.  In the past she has tolerated prednisone without problem.  Denies any bony issues.     Simple office walk 185 feet x  3 laps goal with forehead probe 02/19/2018  03/31/2018   O2 used rooom air Room air  Number laps completed 3 3 x 250 feet  Comments about pace Moderate pace Mod pace  Resting Pulse Ox/HR 99% and 71/min 100% and 89/min  Final Pulse Ox/HR 95% and 92/min 98% and 111  Desaturated </= 88% no no  Desaturated <= 3% points yes no  Got Tachycardic >/= 90/min yes yes  Symptoms at end of test none none  Miscellaneous comments x x   Results for ANSLEIGH, SAFER (MRN [id number]) as of 03/31/2018 16:37  Ref. Range 12/16/2017 19:24 12/17/2017 18:59 02/19/2018 10:53  Sed Rate Latest Ref Range: 0 - 30 mm/hr 76 (H) 69 (H) 18  Results for CELINE, DISHMAN (MRN [id number]) as of 03/31/2018 16:37  Ref. Range 02/19/2018 10:53 02/19/2018 11:03  Anti Nuclear Antibody(ANA) Latest Ref Range: Negative   Negative  Anti JO-1 Latest Ref Range: 0.0 - 0.9 AI  <0.2  ds DNA Ab Latest Units: IU/mL <1     ROS - per HPI     has a past medical history of Acute respiratory failure ([place]), CAP (community acquired pneumonia), Hyperlipidemia, Hypertension, and Metabolic syndrome.   reports that she has never smoked. She has never used smokeless  tobacco.  Past Surgical History:  Procedure Laterality Date  . CESAREAN SECTION      Allergies  Allergen Reactions  . Homatropine     Pt doesn't remember  . Hydromet [Hydrocodone-Homatropine]     Pt doesn't remember  . Amoxicillin Rash    Immunization History  Administered Date(s) Administered  . Influenza, High Dose Seasonal PF 03/11/2017, 03/09/2018  . Influenza,inj,Quad PF,6+ Mos 02/04/2014, 02/09/2015, 02/20/2016  . Pneumococcal Conjugate-13 08/10/2015  . Pneumococcal Polysaccharide-23 06/17/2017, 12/21/2017    Family History  Problem Relation Age of Onset  . Cancer Father   . Healthy Brother   . Healthy Daughter   . Healthy Brother      Current Outpatient Medications:  .  acetaminophen (TYLENOL) 325 MG tablet, Take 1-2 tablets (325-650 mg total) by mouth every 4 (four) hours as needed for mild pain., Disp: , Rfl:  .  amLODipine (NORVASC) 5 MG tablet, Take 1 tablet (5 mg total) by mouth at bedtime., Disp: 30 tablet, Rfl: 2 .  aspirin EC 81 MG tablet, Take 1 tablet (81 mg total) by mouth daily., Disp: 90 tablet, Rfl: 3 .  benzonatate (TESSALON PERLES) 100 MG capsule, Take 1 capsule (100 mg total) by mouth 2 (two) times daily., Disp: 60 capsule, Rfl: 1 .  carvedilol (COREG) 3.125 MG tablet, Take 1 tablet (3.125 mg total) by mouth 2 (two) times daily with a meal., Disp: 60 tablet, Rfl: 0 .  cetirizine (ZYRTEC) 10 MG tablet, Take 1 tablet (10 mg total) by mouth daily as needed for allergies., Disp: , Rfl:  .  Ipratropium-Albuterol (COMBIVENT RESPIMAT) 20-100 MCG/ACT AERS respimat, Inhale 1 puff into the lungs every 6 (six) hours as needed for wheezing., Disp: 1 Inhaler, Rfl: 0 .  levothyroxine (SYNTHROID, LEVOTHROID) 50 MCG tablet, Take 1 tablet (50 mcg total) by mouth daily before breakfast., Disp: 90 tablet, Rfl: 1 .  losartan (COZAAR) 25 MG tablet, Take 0.5 tablets (12.5 mg total) by mouth 2 (two) times daily., Disp: 90 tablet, Rfl: 1 .  pantoprazole (PROTONIX) 40 MG  tablet, Take 1 tablet (40 mg total) by mouth daily., Disp: 30 tablet, Rfl: 0 .  sodium chloride (OCEAN) 0.65 % SOLN nasal spray, Place 1 spray into both nostrils 4 (four) times daily -  with meals and at bedtime., Disp: , Rfl: 0      Objective:   Vitals:   03/31/18 1610  BP: (!) 146/88  Pulse: 88  SpO2: 98%  Weight: 141 lb 12.8 oz (64.3 kg)  Height: _0  (1.549 m)    Estimated body mass index is 26.79 kg/m as calculated from the following:   Height as of this encounter: _1  (1.549 m).   Weight as of this encounter: 141 lb 12.8 oz (64.3 kg).  _2 @  [employer]   03/31/18 1610  Weight: 141 lb 12.8 oz (64.3 kg)     Physical Exam  General Appearance:    Alert, cooperative, no distress, appears stated age - yes , Deconditioned looking - yes , OBESE  - no, Sitting on Wheelchair -  no  Head:    Normocephalic, without obvious abnormality, atraumatic  Eyes:    PERRL, conjunctiva/corneas clear,  Ears:    Normal TM's and external ear canals, both ears  Nose:   Nares normal, septum midline, mucosa normal, no drainage    or sinus tenderness. OXYGEN ON  - no . Patient is @ ra   Throat:   Lips, mucosa, and tongue normal; teeth and gums normal. Cyanosis on lips - no  Neck:   Supple, symmetrical, trachea midline, no adenopathy;    thyroid:  no enlargement/tenderness/nodules; no carotid   bruit or JVD  Back:     Symmetric, no curvature, ROM normal, no CVA tenderness  Lungs:     Distress - no , Wheeze no, Barrell Chest - no, Purse lip breathing - no, Crackles - yes mild at base   Chest Wall:    No tenderness or deformity.    Heart:    Regular rate and rhythm, S1 and S2 normal, no rub   or gallop, Murmur - no  Breast Exam:    NOT DONE  Abdomen:     Soft, non-tender, bowel sounds active all four quadrants,    no masses, no organomegaly. Visceral obesity - no  Genitalia:   NOT DONE  Rectal:   NOT DONE  Extremities:   Extremities - normal, Has Cane - no, Clubbing - no, Edema  - no  Pulses:   2+ and symmetric all extremities  Skin:   Stigmata of Connective Tissue Disease - no  Lymph nodes:   Cervical, supraclavicular, and axillary nodes normal  Psychiatric:  Neurologic:   Pleasant - yes, Anxious - no, Flat affect - yes  CAm-ICU - neg, Alert and Oriented x 3 - yes, Moves all 4s - yes, Speech - normal, Cognition - intact           Assessment:       ICD-10-CM   1. BOOP (bronchiolitis obliterans with organizing pneumonia) ([place]) J84.89   2. Interstitial lung disease ([facility]) J84.9    She is improved (clinically, radiologically and serology ANA/ESR) but she has residual groundglass opacities.  There is some evolving fibrosis.  Based on this and the fact that steroid course was only several weeks I think it is reasonable to give her a another course of steroids for 8 weeks even though the ESR is normal.  We discussed side effects including osteoporosis, mood changes, diabetes, hypertension.  She is tolerated steroids in the past.  Therefore we will start at a lower dose and titrate her over 8 weeks.  She is willing to give this a try.  We will see her in follow-up in 4 weeks to ensure that steroid tolerance is okay.    Plan:     Patient Instructions     ICD-10-CM   1. BOOP (bronchiolitis obliterans with organizing pneumonia) ([place]) J84.89   2. Interstitial lung disease (HCC) J84.9     Significantly improved clinically and radiologically Residual cough might be because of residual inflammation present on CT scan November 2019  Plan -I think is a good idea to do 8 or 10 -week prednisone course -we discussed the side effects of prednisone extensively -Please start prednisone as follows 30 mg daily x2 weeks and then 20 mg daily x2 weeks and then 10 mg daily x2 weeks and then 5 mg daily x2 weeks and then 5 mg every other day x2 weeks and stop  Followup -Return in 4 weeks to see nurse practitioner to ensure that you are tolerating steroids well without side effects  [interpreter needed for the visit]   -Return to see Dr. Chase Caller in ILD clinic in 10 weeks (do spirometry and DLCO ahead of the visit]  -We will decide timing of the CT scan at the time of follow-up   .................Marland Kitchen Mejora significativa clnica y radiolgicamente La tos residual puede deberse a la inflamacin residual presente en la tomografa computarizada de [place] de 2019  Plan -Creo que es una buena idea hacer 8 o 10 semanas de curso de prednisona. Discutimos ampliamente los efectos secundarios de la prednisona. -Inicie la prednisona de la siguiente manera 30 mg diarios x2 semanas y luego 20 mg diarios x2 semanas y luego 10 mg diarios x2 semanas y luego 5 mg diarios x2 Armed forces logistics/support/administrative officer y [employer] 5 mg [employer] x2 semanas y pare  Seguir -Vuelva en 4 semanas para ver a Freight forwarder practicante para asegurarse de que est tolerando bien los esteroides sin efectos secundarios [se necesita un intrprete para la visita]  -Vuelva a ver al Dr. Chase Caller en la clnica ILD en 10 semanas (haga espirometra y DLCO antes de la visita) -Decidiremos el momento de la tomografa computarizada en el momento del seguimiento  > 50% of this > 25 min visit spent in face to  face counseling or coordination of care - by this undersigned MD - Dr Brand Males. This includes one or more of the following documented above: discussion of test results, diagnostic or treatment recommendations, prognosis, risks and benefits of management options, instructions, education, compliance or risk-factor reduction    SIGNATURE    Dr. Brand Males, M.D., F.C.C.P,  Pulmonary and Critical Care Medicine Staff Physician, [place] Director - Interstitial Lung Disease  Program  Pulmonary [facility] [place], [place], [address]  Pager: [phone], If no answer or between  15:00h - 7:00h: call [phone] Telephone: [phone]  4:58  PM 03/31/2018

## 2018-04-02 ENCOUNTER — Ambulatory Visit: Payer: Medicare Other | Admitting: Internal Medicine

## 2018-04-21 ENCOUNTER — Encounter: Payer: Self-pay | Admitting: Neurology

## 2018-04-24 ENCOUNTER — Ambulatory Visit (INDEPENDENT_AMBULATORY_CARE_PROVIDER_SITE_OTHER): Payer: Medicare Other | Admitting: Family Medicine

## 2018-04-24 ENCOUNTER — Encounter: Payer: Self-pay | Admitting: Family Medicine

## 2018-04-24 VITALS — BP 164/90 | HR 84 | Temp 97.0°F | Ht 61.0 in | Wt 144.8 lb

## 2018-04-24 DIAGNOSIS — I10 Essential (primary) hypertension: Secondary | ICD-10-CM

## 2018-04-24 NOTE — Progress Notes (Signed)
BP (!) 164/90   Pulse 84   Temp (!) 97 F (36.1 C) (Oral)   Ht 5\' 1"  (1.549 m)   Wt 144 lb 12.8 oz (65.7 kg)   BMI 27.36 kg/m    Subjective:    [name], female    DOB: Oct 27, 1950, 68 y.o.   MRN: [id number]  HPI: Tasha [name] is a 68 y.o. female presenting on 04/24/2018 for Depression (4 week re check ); Hypertension; and Hypothyroidism   HPI Hypertension Patient is currently on amlodipine and carvedilol and losartan, and their blood pressure today is 164/90. Patient denies any lightheadedness or dizziness. Patient denies headaches, blurred vision, chest pains, shortness of breath, or weakness. Denies any side effects from medication and is content with current medication.  Patient says her medication is running lower at home and has been running in the 120s to 130s at home consistently.   Relevant past medical, surgical, family and social history reviewed and updated as indicated. Interim medical history since our last visit reviewed. Allergies and medications reviewed and updated.  Review of Systems  Constitutional: Negative for chills and fever.  Eyes: Negative for redness and visual disturbance.  Respiratory: Negative for chest tightness and shortness of breath.   Cardiovascular: Negative for chest pain and leg swelling.  Musculoskeletal: Negative for back pain and gait problem.  Skin: Negative for rash.  Neurological: Negative for light-headedness and headaches.  Psychiatric/Behavioral: Negative for agitation and behavioral problems.  All other systems reviewed and are negative.   Per HPI unless specifically indicated above   Allergies as of 04/24/2018      Reactions   Homatropine    Pt doesn't remember   Hydromet [hydrocodone-homatropine]    Pt doesn't remember   Amoxicillin Rash      Medication List       Accurate as of April 24, 2018 11:59 PM. Always use your most recent med list.        acetaminophen 325 MG tablet Commonly known as:   TYLENOL Take 1-2 tablets (325-650 mg total) by mouth every 4 (four) hours as needed for mild pain.   amLODipine 5 MG tablet Commonly known as:  NORVASC Take 1 tablet (5 mg total) by mouth at bedtime.   aspirin EC 81 MG tablet Take 1 tablet (81 mg total) by mouth daily.   benzonatate 100 MG capsule Commonly known as:  TESSALON PERLES Take 1 capsule (100 mg total) by mouth 2 (two) times daily.   carvedilol 3.125 MG tablet Commonly known as:  COREG Take 1 tablet (3.125 mg total) by mouth 2 (two) times daily with a meal.   cetirizine 10 MG tablet Commonly known as:  ZYRTEC Take 1 tablet (10 mg total) by mouth daily as needed for allergies.   Ipratropium-Albuterol 20-100 MCG/ACT Aers respimat Commonly known as:  COMBIVENT RESPIMAT Inhale 1 puff into the lungs every 6 (six) hours as needed for wheezing.   levothyroxine 50 MCG tablet Commonly known as:  SYNTHROID, LEVOTHROID Take 1 tablet (50 mcg total) by mouth daily before breakfast.   losartan 25 MG tablet Commonly known as:  COZAAR Take 0.5 tablets (12.5 mg total) by mouth 2 (two) times daily.   pantoprazole 40 MG tablet Commonly known as:  PROTONIX Take 1 tablet (40 mg total) by mouth daily.   sodium chloride 0.65 % Soln nasal spray Commonly known as:  OCEAN Place 1 spray into both nostrils 4 (four) times daily -  with meals and at bedtime.  Objective:    BP (!) 164/90   Pulse 84   Temp (!) 97 F (36.1 C) (Oral)   Ht 5\' 1"  (1.549 m)   Wt 144 lb 12.8 oz (65.7 kg)   BMI 27.36 kg/m   Wt Readings from Last 3 Encounters:  04/30/18 143 lb (64.9 kg)  04/24/18 144 lb 12.8 oz (65.7 kg)  03/31/18 141 lb 12.8 oz (64.3 kg)    Physical Exam Vitals signs and nursing note reviewed.  Constitutional:      General: She is not in acute distress.    Appearance: She is well-developed. She is not diaphoretic.  Eyes:     Conjunctiva/sclera: Conjunctivae normal.  Cardiovascular:     Rate and Rhythm: Normal rate and  regular rhythm.     Heart sounds: Normal heart sounds. No murmur.  Pulmonary:     Effort: Pulmonary effort is normal. No respiratory distress.     Breath sounds: Normal breath sounds. No wheezing.  Musculoskeletal: Normal range of motion.        General: No tenderness.  Skin:    General: Skin is warm and dry.     Findings: No rash.  Neurological:     Mental Status: She is alert and oriented to person, place, and time.     Coordination: Coordination normal.  Psychiatric:        Behavior: Behavior normal.         Assessment & Plan:   Problem List Items Addressed This Visit      Cardiovascular and Mediastinum   Hypertension - Primary       Follow up plan: Return in about 6 months (around 10/23/2018), or if symptoms worsen or fail to improve, for Hypertension recheck.  Counseling provided for all of the vaccine components No orders of the defined types were placed in this encounter.   Caryl Pina, MD [facility] 04/30/2018, 9:49 PM

## 2018-04-30 ENCOUNTER — Ambulatory Visit (INDEPENDENT_AMBULATORY_CARE_PROVIDER_SITE_OTHER): Payer: Medicare Other | Admitting: Nurse Practitioner

## 2018-04-30 ENCOUNTER — Encounter: Payer: Self-pay | Admitting: Nurse Practitioner

## 2018-04-30 VITALS — BP 140/72 | HR 80 | Ht 61.0 in | Wt 143.0 lb

## 2018-04-30 DIAGNOSIS — J8489 Other specified interstitial pulmonary diseases: Secondary | ICD-10-CM | POA: Diagnosis not present

## 2018-04-30 DIAGNOSIS — J849 Interstitial pulmonary disease, unspecified: Secondary | ICD-10-CM

## 2018-04-30 NOTE — Patient Instructions (Addendum)
Glad that you are doing well Continue current medications Patient will complete prednisone taper this week Will discuss scheduling follow up CT  Scan at [place] visit. Follow up with Dr. Chase Caller in ILD clinic as scheduled on feb 20th (schedule PFT ahead of the visit).

## 2018-04-30 NOTE — Assessment & Plan Note (Addendum)
Patient is clinically improved (clinically, radiologically, and serology ANA/ESR), but considering residual groundglass opacities Dr. Chase Caller decided to extend her prednisone for 8 to 10 weeks.  She is scheduled to finish the prednisone this weekend and has done well with it.  Denies any adverse effects.  Has upcoming appointment scheduled with ILD clinic with Dr. Chase Caller and will have a PFT before the appointment.  At her next appointment it will be decided when she will get her follow-up CT scan.  Patient Instructions  Glad that you are doing well Continue current medications Patient will complete prednisone taper this week Will discuss scheduling follow up CT  Scan at [place] visit. Follow up with Dr. Chase Caller in ILD clinic as scheduled on feb 20th (schedule PFT ahead of the visit).

## 2018-04-30 NOTE — Assessment & Plan Note (Signed)
Patient Instructions  Glad that you are doing well Continue current medications Patient will complete prednisone taper this week Will discuss scheduling follow up CT  Scan at [place] visit. Follow up with Dr. Chase Caller in ILD clinic as scheduled on feb 20th (schedule PFT ahead of the visit).

## 2018-04-30 NOTE — Progress Notes (Signed)
@Patient  ID: Tasha [name], female    DOB: 23-Apr-1950, 68 y.o.   MRN: [id number]  Chief Complaint  Patient presents with  . Follow-up    BOOP (bronchiolitis obliterans with organizing pneumonia) [place]    Referring provider: Dettinger, Fransisca Kaufmann, MD  HPI 68 year old female never smoker with PMH, hypertension, metabolic syndrome, thrombocytopenia, diabetes, interstitial lung disease, PAF, BOOP followed by Dr. Chase Caller.  TESTS/recent events: 12/16/2017 - hospital admission -acute hypoxic respiratory failure in the setting of bilateral pulmonary infiltrates.  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  Diagnosed with acute ILD versus pneumonia thought to be from exposure to antifreeze leak in patient's car.  Quired intubation from 8/30-9/5 chest tube placed due to pneumothorax.  Discharged to inpatient rehab from 12/30/17 - 01/09/2018.  Chest CT 03/11/18>>Residual subpleural and basilar predominant ground-glass, mild subpleural reticulation and associated bronchiectasis/bronchiolectasis, findings which may represent post infectious/postinflammatory fibrosis. Nonspecific interstitial pneumonitis is not excluded. Findings are indeterminate for UIP per consensus guidelines.  Results for Tasha, [name] (MRN [id number]) as of 02/19/2018 10:01  Ref. Range 12/16/2017 19:24 12/17/2017 18:59  Sed Rate Latest Ref Range: 0 - 22 mm/hr 76 (H) 69 (H)  Results for Tasha, [name] (MRN [id number]) as of 02/19/2018 10:01  Ref. Range 12/17/2017 18:59  Speckled Pattern Unknown 1:320 (H)  Results for Tasha, [name] (MRN [id number]) as of 02/19/2018 10:01  Ref. Range 12/19/2017 14:15  Color, Fluid Latest Ref Range: YELLOW  COLORLESS (A)  WBC, Fluid Latest Ref Range: 0 - 1,000 cu mm 93  Lymphs, Fluid Latest Units: % 78  Eos, Fluid Latest Units: % 2  Appearance, Fluid Latest Ref Range: CLEAR  CLEAR (A)  Neutrophil Count, Fluid Latest Ref Range: 0 - 25 % 7  Monocyte-Macrophage-Serous Fluid Latest Ref Range: 50 - 90 % 13 (L)     OV 04/30/18 - Follow up Patient presents today for a follow-up visit.  At the end of August 2019 she suffered acute lung injury, possibly BOOP, based on the nature of infiltrates and high ESR and a positive ANA.  There was a history of anti-freeze exposure.  She was last seen by Dr. Chase Caller on 03/31/2018.  He placed patient on a 10-week prednisone taper due to residual groundglass opacities and evolving fibrosis seen on last CT scan.  Patient is accompanied today by her daughter and a Optometrist.  She states that she is feeling much improved.  Scheduled to finish her prednisone taper this weekend.  She states that her cough is resolved now.  She denies any recent fevers.  She denies any recent chills.  She denies any chest pain, shortness of breath, or edema.  Allergies  Allergen Reactions  . Homatropine     Pt doesn't remember  . Hydromet [Hydrocodone-Homatropine]     Pt doesn't remember  . Amoxicillin Rash    Immunization History  Administered Date(s) Administered  . Influenza, High Dose Seasonal PF 03/11/2017, 03/09/2018  . Influenza,inj,Quad PF,6+ Mos 02/04/2014, 02/09/2015, 02/20/2016  . Pneumococcal Conjugate-13 08/10/2015  . Pneumococcal Polysaccharide-23 06/17/2017, 12/21/2017    Past Medical History:  Diagnosis Date  . Acute respiratory failure ([place])   . CAP (community acquired pneumonia)   . Hyperlipidemia   . Hypertension   . Metabolic syndrome     Tobacco History: Social History   Tobacco Use  Smoking Status Never Smoker  Smokeless Tobacco Never Used   Counseling given: Yes   Outpatient Encounter Medications as of 04/30/2018  Medication Sig  . acetaminophen (TYLENOL) 325 MG tablet Take 1-2 tablets (325-650 mg  total) by mouth every 4 (four) hours as needed for mild pain.  Marland Kitchen amLODipine (NORVASC) 5 MG tablet Take 1 tablet (5 mg total) by mouth at bedtime.  Marland Kitchen aspirin EC 81 MG tablet Take 1 tablet (81 mg total) by mouth daily.  . benzonatate (TESSALON PERLES)  100 MG capsule Take 1 capsule (100 mg total) by mouth 2 (two) times daily.  . carvedilol (COREG) 3.125 MG tablet Take 1 tablet (3.125 mg total) by mouth 2 (two) times daily with a meal.  . cetirizine (ZYRTEC) 10 MG tablet Take 1 tablet (10 mg total) by mouth daily as needed for allergies.  . Ipratropium-Albuterol (COMBIVENT RESPIMAT) 20-100 MCG/ACT AERS respimat Inhale 1 puff into the lungs every 6 (six) hours as needed for wheezing.  Marland Kitchen levothyroxine (SYNTHROID, LEVOTHROID) 50 MCG tablet Take 1 tablet (50 mcg total) by mouth daily before breakfast.  . losartan (COZAAR) 25 MG tablet Take 0.5 tablets (12.5 mg total) by mouth 2 (two) times daily.  . pantoprazole (PROTONIX) 40 MG tablet Take 1 tablet (40 mg total) by mouth daily.  . sodium chloride (OCEAN) 0.65 % SOLN nasal spray Place 1 spray into both nostrils 4 (four) times daily -  with meals and at bedtime.   No facility-administered encounter medications on file as of 04/30/2018.      Review of Systems  Review of Systems  Constitutional: Negative.  Negative for chills and fever.  HENT: Negative.   Respiratory: Negative for cough, shortness of breath and wheezing.   Cardiovascular: Negative.  Negative for chest pain, palpitations and leg swelling.  Gastrointestinal: Negative.   Allergic/Immunologic: Negative.   Neurological: Negative.   Psychiatric/Behavioral: Negative.        Physical Exam  BP 140/72 (BP Location: Left Arm, Patient Position: Sitting, Cuff Size: Normal)   Pulse 80   Ht 5' 1"  (1.549 m)   Wt 143 lb (64.9 kg)   SpO2 100%   BMI 27.02 kg/m   Wt Readings from Last 5 Encounters:  04/30/18 143 lb (64.9 kg)  04/24/18 144 lb 12.8 oz (65.7 kg)  03/31/18 141 lb 12.8 oz (64.3 kg)  03/18/18 142 lb (64.4 kg)  03/16/18 141 lb (64 kg)     Physical Exam Vitals signs and nursing note reviewed.  Constitutional:      General: She is not in acute distress.    Appearance: She is well-developed.  Cardiovascular:     Rate  and Rhythm: Normal rate and regular rhythm.  Pulmonary:     Effort: Pulmonary effort is normal. No respiratory distress.     Breath sounds: Normal breath sounds. No wheezing or rhonchi.  Musculoskeletal:        General: No swelling.  Neurological:     Mental Status: She is alert and oriented to person, place, and time.       Assessment & Plan:   BOOP (bronchiolitis obliterans with organizing pneumonia) ([place]) Patient is clinically improved (clinically, radiologically, and serology ANA/ESR), but considering residual groundglass opacities Dr. Chase Caller decided to extend her prednisone for 8 to 10 weeks.  She is scheduled to finish the prednisone this weekend and has done well with it.  Denies any adverse effects.  Has upcoming appointment scheduled with ILD clinic with Dr. Chase Caller and will have a PFT before the appointment.  At her next appointment it will be decided when she will get her follow-up CT scan.  Patient Instructions  Glad that you are doing well Continue current medications Patient will complete prednisone  taper this week Will discuss scheduling follow up CT  Scan at [place] visit. Follow up with Dr. Chase Caller in ILD clinic as scheduled on feb 20th (schedule PFT ahead of the visit).     Interstitial lung disease [facility]) Patient Instructions  Glad that you are doing well Continue current medications Patient will complete prednisone taper this week Will discuss scheduling follow up CT  Scan at [place] visit. Follow up with Dr. Chase Caller in ILD clinic as scheduled on feb 20th (schedule PFT ahead of the visit).        Fenton Foy, NP 04/30/2018

## 2018-05-18 ENCOUNTER — Other Ambulatory Visit: Payer: Self-pay | Admitting: Family Medicine

## 2018-05-18 DIAGNOSIS — R05 Cough: Secondary | ICD-10-CM

## 2018-05-18 DIAGNOSIS — R053 Chronic cough: Secondary | ICD-10-CM

## 2018-05-25 NOTE — Progress Notes (Deleted)
NEUROLOGY CONSULTATION NOTE  Tasha [name] MRN: [id number] DOB: February 05, 1951  Referring provider: Caryl Pina, MD Primary care provider: Caryl Pina, MD  Reason for consult:  Headache and facial pain  HISTORY OF PRESENT ILLNESS: Tasha [name] is a 68 year old ***-handed woman with hypertension, hyperlipidemia, and acquired hypothyroidism who presents for headache and right facial pain.  History supplemented by referring provider note.  Symptoms started in October or November 2019.  She reports a pressure on the top of her head as well as a burning pain along the right sided jaw line, radiating into the right side of her cheek and nasolabial fold.  No specific preceding event.  She denies blurred vision, facial numbness, nausea, vomiting, photophobia, phonophobia or unilateral weakness.  Current medications include: Aspirin 81 mg, Norvasc, Coreg, losartan  Labs from October November include:: CMP with NA 145, K3.9, CL 106, CO2 24, glucose 83, BUN 11, CR 0.68, T bili 0.3, ALP 55, AST 33, ALT 38; B12 1096; TSH 2.690; sed rate 18, ANA negative  PAST MEDICAL HISTORY: Past Medical History:  Diagnosis Date  . Acute respiratory failure ([place])   . CAP (community acquired pneumonia)   . Hyperlipidemia   . Hypertension   . Metabolic syndrome     PAST SURGICAL HISTORY: Past Surgical History:  Procedure Laterality Date  . CESAREAN SECTION      MEDICATIONS: Current Outpatient Medications on File Prior to Visit  Medication Sig Dispense Refill  . acetaminophen (TYLENOL) 325 MG tablet Take 1-2 tablets (325-650 mg total) by mouth every 4 (four) hours as needed for mild pain.    Marland Kitchen amLODipine (NORVASC) 5 MG tablet Take 1 tablet (5 mg total) by mouth at bedtime. 30 tablet 2  . aspirin EC 81 MG tablet Take 1 tablet (81 mg total) by mouth daily. 90 tablet 3  . benzonatate (TESSALON PERLES) 100 MG capsule Take 1 capsule (100 mg total) by mouth 2 (two) times daily. 60 capsule 1  .  carvedilol (COREG) 3.125 MG tablet Take 1 tablet (3.125 mg total) by mouth 2 (two) times daily with a meal. 60 tablet 0  . cetirizine (ZYRTEC) 10 MG tablet Take 1 tablet (10 mg total) by mouth daily as needed for allergies.    . Ipratropium-Albuterol (COMBIVENT RESPIMAT) 20-100 MCG/ACT AERS respimat Inhale 1 puff into the lungs every 6 (six) hours as needed for wheezing. 1 Inhaler 0  . levothyroxine (SYNTHROID, LEVOTHROID) 50 MCG tablet Take 1 tablet (50 mcg total) by mouth daily before breakfast. 90 tablet 1  . lisinopril-hydrochlorothiazide (ZESTORETIC) 20-12.5 MG per tablet Take 1 tablet by mouth daily. 30 tablet 0  . losartan (COZAAR) 25 MG tablet Take 0.5 tablets (12.5 mg total) by mouth 2 (two) times daily. 90 tablet 1  . pantoprazole (PROTONIX) 40 MG tablet Take 1 tablet (40 mg total) by mouth daily. 30 tablet 0  . sodium chloride (OCEAN) 0.65 % SOLN nasal spray Place 1 spray into both nostrils 4 (four) times daily -  with meals and at bedtime.  0   No current facility-administered medications on file prior to visit.     ALLERGIES: Allergies  Allergen Reactions  . Homatropine     Pt doesn't remember  . Hydromet [Hydrocodone-Homatropine]     Pt doesn't remember  . Amoxicillin Rash    FAMILY HISTORY: Family History  Problem Relation Age of Onset  . Cancer Father   . Healthy Brother   . Healthy Daughter   . Healthy Brother  SOCIAL HISTORY: Social History   Socioeconomic History  . Marital status: Married    Spouse name: Not on file  . Number of children: Not on file  . Years of education: Not on file  . Highest education level: Not on file  Occupational History  . Not on file  Social Needs  . Financial resource strain: Not hard at all  . Food insecurity:    Worry: Never true    Inability: Never true  . Transportation needs:    Medical: No    Non-medical: No  Tobacco Use  . Smoking status: Never Smoker  . Smokeless tobacco: Never Used  Substance and Sexual  Activity  . Alcohol use: No  . Drug use: No  . Sexual activity: Not Currently    Birth control/protection: Post-menopausal  Lifestyle  . Physical activity:    Days per week: 7 days    Minutes per session: 30 min  . Stress: Only a little  Relationships  . Social connections:    Talks on phone: Never    Gets together: Never    Attends religious service: More than 4 times per year    Active member of club or organization: Yes    Attends meetings of clubs or organizations: More than 4 times per year    Relationship status: Married  . Intimate partner violence:    Fear of current or ex partner: No    Emotionally abused: No    Physically abused: No    Forced sexual activity: No  Other Topics Concern  . Not on file  Social History Narrative  . Not on file    REVIEW OF SYSTEMS: Constitutional: No fevers, chills, or sweats, no generalized fatigue, change in appetite Eyes: No visual changes, double vision, eye pain Ear, nose and throat: No hearing loss, ear pain, nasal congestion, sore throat Cardiovascular: No chest pain, palpitations Respiratory:  No shortness of breath at rest or with exertion, wheezes GastrointestinaI: No nausea, vomiting, diarrhea, abdominal pain, fecal incontinence Genitourinary:  No dysuria, urinary retention or frequency Musculoskeletal:  No neck pain, back pain Integumentary: No rash, pruritus, skin lesions Neurological: as above Psychiatric: No depression, insomnia, anxiety Endocrine: No palpitations, fatigue, diaphoresis, mood swings, change in appetite, change in weight, increased thirst Hematologic/Lymphatic:  No purpura, petechiae. Allergic/Immunologic: no itchy/runny eyes, nasal congestion, recent allergic reactions, rashes  PHYSICAL EXAM: *** General: No acute distress.  Patient appears ***-groomed.  *** Head:  Normocephalic/atraumatic Eyes:  fundi examined but not visualized Neck: supple, no paraspinal tenderness, full range of motion Back:  No paraspinal tenderness Heart: regular rate and rhythm Lungs: Clear to auscultation bilaterally. Vascular: No carotid bruits. Neurological Exam: Mental status: alert and oriented to person, place, and time, recent and remote memory intact, fund of knowledge intact, attention and concentration intact, speech fluent and not dysarthric, language intact. Cranial nerves: CN I: not tested CN II: pupils equal, round and reactive to light, visual fields intact CN III, IV, VI:  full range of motion, no nystagmus, no ptosis CN V: facial sensation intact CN VII: upper and lower face symmetric CN VIII: hearing intact CN IX, X: gag intact, uvula midline CN XI: sternocleidomastoid and trapezius muscles intact CN XII: tongue midline Bulk & Tone: normal, no fasciculations. Motor:  5/5 throughout *** Sensation:  [place] *** temperature *** and vibration sensation intact.  ***. Deep Tendon Reflexes:  2+ throughout, *** toes downgoing.  *** Finger to nose testing:  Without dysmetria.  *** Heel to shin:  Without dysmetria.  *** Gait:  Normal station and stride.  Able to turn and tandem walk. [place] ***.  IMPRESSION: ***  PLAN: ***  Thank you for allowing me to take part in the care of this patient.  Metta Clines, DO  CC: Caryl Pina, MD    [

## 2018-05-26 ENCOUNTER — Ambulatory Visit: Payer: Medicare Other | Admitting: Neurology

## 2018-06-02 LAB — ACID FAST CULTURE WITH REFLEXED SENSITIVITIES (MYCOBACTERIA): Acid Fast Culture: NEGATIVE

## 2018-06-11 ENCOUNTER — Ambulatory Visit: Payer: Medicare Other | Admitting: Internal Medicine

## 2018-06-11 ENCOUNTER — Encounter: Payer: Self-pay | Admitting: Internal Medicine

## 2018-06-11 ENCOUNTER — Ambulatory Visit (INDEPENDENT_AMBULATORY_CARE_PROVIDER_SITE_OTHER): Payer: Medicare Other | Admitting: Internal Medicine

## 2018-06-11 VITALS — BP 150/78 | HR 86 | Ht 61.0 in | Wt 147.0 lb

## 2018-06-11 DIAGNOSIS — J8410 Pulmonary fibrosis, unspecified: Secondary | ICD-10-CM

## 2018-06-11 DIAGNOSIS — J849 Interstitial pulmonary disease, unspecified: Secondary | ICD-10-CM

## 2018-06-11 DIAGNOSIS — J8489 Other specified interstitial pulmonary diseases: Secondary | ICD-10-CM

## 2018-06-11 LAB — PULMONARY FUNCTION TEST
DL/VA % pred: 95 %
DL/VA: 4.05 ml/min/mmHg/L
DLCO UNC % PRED: 74 %
DLCO unc: 13.22 ml/min/mmHg
FEF 25-75 Pre: 3.27 L/sec
FEF2575-%Pred-Pre: 175 %
FEV1-%Pred-Pre: 90 %
FEV1-Pre: 1.88 L
FEV1FVC-%Pred-Pre: 119 %
FEV6-%PRED-PRE: 78 %
FEV6-Pre: 2.05 L
FEV6FVC-%PRED-PRE: 104 %
FVC-%PRED-PRE: 74 %
FVC-Pre: 2.05 L
Pre FEV1/FVC ratio: 92 %
Pre FEV6/FVC Ratio: 100 %

## 2018-06-11 NOTE — Progress Notes (Signed)
OV 01/22/18 69 year old female, never smoked. PMH  Hypertension, metabolic syndrome, thrombocytopenia, prediabetes, interstitial lung disease, PAF. Not previously followed by [facility] pulmonary care. Admitted to [facility] from outside hospital on 08/27 with acute hypoxic respiratory failure in setting of bilateral pulmonary infiltrates. Work up indicated [facility]. Dx subacute ILD vs pneumonia thought to be from exposure to antifreeze leak in patients car. Requiring intubation from 08/30-09/05, chest tube placed due to pneumothorax. [facility]/10- 09/20, required min assistance with mobility and basic self-care tasks. Speech therapy worked on dysphagia, able to tolerate regular diet thin liquids without signs and symptoms of aspiration.    Patient presents today for hospital follow-up visit. Accompanied by her daughter who also provides translation. She is feeling a lot better, no breathing issues. Finished oral steroid today. BS 119. CBC improving. Eating and drinking ok. No difficulties swallowing and denies aspiration. Ambulting with walker. Denies fall, sob, wheezing, cough, fever or chest pain.    Results for FOREVER, ARECHIGA (MRN [id number]) as of 02/19/2018 10:01  Ref. Range 12/16/2017 19:24 12/17/2017 18:59  Sed Rate Latest Ref Range: 0 - 22 mm/hr 76 (H) 69 (H)  Results for CARAN, STORCK (MRN [id number]) as of 02/19/2018 10:01  Ref. Range 12/17/2017 18:59  Speckled Pattern Unknown 1:320 (H)  Results for VERONDA, GABOR (MRN [id number]) as of 02/19/2018 10:01  Ref. Range 12/19/2017 14:15  Color, Fluid Latest Ref Range: YELLOW  COLORLESS (A)  WBC, Fluid Latest Ref Range: 0 - 1,000 cu mm 93  Lymphs, Fluid Latest Units: % 78  Eos, Fluid Latest Units: % 2  Appearance, Fluid Latest Ref Range: CLEAR  CLEAR (A)  Neutrophil Count, Fluid Latest Ref Range: 0 - 25 % 7  Monocyte-Macrophage-Serous Fluid Latest Ref Range: 50 - 90 % 13 (L)     OV  02/19/2018  Subjective:  [name], female , DOB: [birth year] , age 46 y.o. , MRN: [id number] , ADDRESS: [address] [place] [address]   02/19/2018 -   Chief Complaint  Patient presents with  . Follow-up    States she still has dry cough, states the SOB has resolved. Denies chest pain or idscomfort.      HPI Tasha [name] 68 y.o. -accompanied by her daughter Verdis Frederickson.  End of August 2019 she suffered acute lung injury possibly Boop based on the nature of infiltrates and a high ESR and a positive ANA [BAL lymphocytosis].  There is some history of antifreeze exposure.  She has recovered from it and is now at home.  She is brought in by Dr. Verdis Frederickson.  Verdis Frederickson is doing the translation.  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  Post discharge in the hospital patient is off oxygen and is overall better.  Steroids ended 2 weeks ago but since the completion of steroids she feels the cough is coming back and getting worse.  Currently cough is moderate in intensity and without any sputum production.  2 days ago her primary care physician apparently changed 1 of her antihypertensives because of the cough.  I am unable to determine which one but I suspect it was an ACE inhibitor.  She is currently on losartan.  There is no fever or chills.  Current walking desaturation test appears adequate        ROS  OV 03/31/2018  Subjective:  [name], female , DOB: Mar 20, 1951 , age 84  y.o. , MRN: [id number] , ADDRESS: [place] [address]   03/31/2018 -   Chief Complaint  Patient presents with  . Follow-up    pt states breathing is doing well. c/o non prod cough.     HPI Tasha [name] 68 y.o. -presents for interstitial lung disease acute with lymphocytic bronchoalveolar lavage after antifreeze exposure  After last visit she showed improvement.  We repeated ANA and this was negative.  We also did some  extra antibody such as anti-Jo 1 and this was negative.  Her ESR has normalized.  In the interim she continues to improve.  Shortness of breath is almost nonexistent.  Cough is improved but she still has mild residual cough.  She had high-resolution CT scan of the chest #2019 that I personally visualized and there is still some residual groundglass opacities along with some evolving fibrotic pattern.  It seems more predominant in the lung base.  Her her son Tasha [name] is with her.  History is gained from talking to interpreter on the video camera.  His name is Tasha [name].  She denies any diabetes or hyperlipidemia or stroke in the past.  She does not have any mood swings.  In the past she has tolerated prednisone without problem.  Denies any bony issues.   OV 06/11/2018  Subjective:  [name], female , DOB: Aug 20, 1950 , age 59 y.o. , MRN: [id number] , ADDRESS: [address] [place] [address]  Tasha [name] 68 y.o. -presents for interstitial lung disease acute with lymphocytic bronchoalveolar lavage after antifreeze exposure 06/11/2018 -   Chief Complaint  Patient presents with  . Follow-up    PFT performed today.  Pt states she has been doing well since last visit and denies any complaints.      HPI Tasha [name] 68 y.o. -follow-up.  At last visit in December 2019 I placed her on a 10-week prednisone course.  In between she came and saw a nurse practitioner approximately just over a month ago.  She was tolerating the prednisone fine.  It is now 4 weeks and she finished the prednisone.  According to the daughter who acted as the interpreter the prednisone really did help with her symptoms.  It improved her shortness of breath.  Currently she is nearly asymptomatic.  Her current symptoms are documented below.  She is feeling good.  She had pulmonary function test today with spirometry being normal and DLCO being near normal.  She is noted to be on ACE inhibitor but she denies any cough.   Last CT scan of the chest was November 2019   SYMPTOM SCALE - ILD 06/11/2018   O2 use ra  Shortness of Breath 0 -> 5 scale with 5 being worst (score 6 If unable to do)  At rest 0  Simple tasks - showers, clothes change, eating, shaving 0  Household (dishes, doing bed, laundry) 0  Shopping 1  Walking level at own pace 0  Walking keeping up with others of same age 61  Walking up Stairs 0  Walking up Hill 0  Total (40 - 48) Dyspnea Score 1  How bad is your cough? 0  How bad is your fatigue 3       Results for RUBEN, PYKA (MRN [id number]) as of 06/11/2018 12:13  Ref. Range 06/11/2018 10:31  FVC-Pre Latest Units: L 2.05  FVC-%Pred-Pre Latest Units: % 74  FEV1-Pre Latest Units: L 1.88  FEV1-%Pred-Pre Latest Units: % 90  Pre FEV1/FVC ratio Latest  Units: % 92   Results for JACKY, HARTUNG (MRN [id number]) as of 06/11/2018 12:13  Ref. Range 06/11/2018 10:31  DLCO unc Latest Units: ml/min/mmHg 13.22  DLCO unc % pred Latest Units: % 74    Simple office walk 185 feet x  3 laps goal with forehead probe 02/19/2018  03/31/2018  06/11/2018   O2 used rooom air Room air Room air  Number laps completed 3 3 x 250 feet 3 x 236 feet  Comments about pace Moderate pace Mod pace Normal pace  Resting Pulse Ox/HR 99% and 71/min 100% and 89/min 100% and 83/mn  Final Pulse Ox/HR 95% and 92/min 98% and 111 98% and 101/min  Desaturated </= 88% no no no  Desaturated <= 3% points yes no no  Got Tachycardic >/= 90/min yes yes yes  Symptoms at end of test none none none  Miscellaneous comments x x x   Results for FELISIA, BALCOM (MRN [id number]) as of 03/31/2018 16:37  Ref. Range 12/16/2017 19:24 12/17/2017 18:59 02/19/2018 10:53  Sed Rate Latest Ref Range: 0 - 30 mm/hr 76 (H) 69 (H) 18  Results for RONNIESHA, SEIBOLD (MRN [id number]) as of 03/31/2018 16:37  Ref. Range 02/19/2018 10:53 02/19/2018 11:03  Anti Nuclear Antibody(ANA) Latest Ref Range: Negative   Negative  Anti JO-1 Latest Ref Range: 0.0 -  0.9 AI  <0.2  ds DNA Ab Latest Units: IU/mL <1      ROS - per HPI     has a past medical history of Acute respiratory failure ([place]), CAP (community acquired pneumonia), Hyperlipidemia, Hypertension, and Metabolic syndrome.   reports that she has never smoked. She has never used smokeless tobacco.  Past Surgical History:  Procedure Laterality Date  . CESAREAN SECTION      Allergies  Allergen Reactions  . Homatropine     Pt doesn't remember  . Hydromet [Hydrocodone-Homatropine]     Pt doesn't remember  . Amoxicillin Rash    Immunization History  Administered Date(s) Administered  . Influenza, High Dose Seasonal PF 03/11/2017, 03/09/2018  . Influenza,inj,Quad PF,6+ Mos 02/04/2014, 02/09/2015, 02/20/2016  . Pneumococcal Conjugate-13 08/10/2015  . Pneumococcal Polysaccharide-23 06/17/2017, 12/21/2017    Family History  Problem Relation Age of Onset  . Cancer Father   . Healthy Brother   . Healthy Daughter   . Healthy Brother      Current Outpatient Medications:  .  acetaminophen (TYLENOL) 325 MG tablet, Take 1-2 tablets (325-650 mg total) by mouth every 4 (four) hours as needed for mild pain., Disp: , Rfl:  .  aspirin EC 81 MG tablet, Take 1 tablet (81 mg total) by mouth daily., Disp: 90 tablet, Rfl: 3 .  cetirizine (ZYRTEC) 10 MG tablet, Take 1 tablet (10 mg total) by mouth daily as needed for allergies., Disp: , Rfl:  .  Ipratropium-Albuterol (COMBIVENT RESPIMAT) 20-100 MCG/ACT AERS respimat, Inhale 1 puff into the lungs every 6 (six) hours as needed for wheezing., Disp: 1 Inhaler, Rfl: 0 .  levothyroxine (SYNTHROID, LEVOTHROID) 50 MCG tablet, Take 1 tablet (50 mcg total) by mouth daily before breakfast., Disp: 90 tablet, Rfl: 1 .  sodium chloride (OCEAN) 0.65 % SOLN nasal spray, Place 1 spray into both nostrils 4 (four) times daily -  with meals and at bedtime., Disp: , Rfl: 0 .  amLODipine (NORVASC) 5 MG tablet, Take 1 tablet (5 mg total) by mouth at bedtime., Disp:  30 tablet, Rfl: 2 .  carvedilol (COREG) 3.125 MG tablet, Take 1 tablet (3.125 mg total)  by mouth 2 (two) times daily with a meal., Disp: 60 tablet, Rfl: 0 .  lisinopril-hydrochlorothiazide (ZESTORETIC) 20-12.5 MG per tablet, Take 1 tablet by mouth daily., Disp: 30 tablet, Rfl: 0 .  losartan (COZAAR) 25 MG tablet, Take 0.5 tablets (12.5 mg total) by mouth 2 (two) times daily., Disp: 90 tablet, Rfl: 1      Objective:   Vitals:   06/11/18 1152  BP: (!) 150/78  Pulse: 86  SpO2: 99%  Weight: 147 lb (66.7 kg)  Height: _0  (1.549 m)    Estimated body mass index is 27.78 kg/m as calculated from the following:   Height as of this encounter: _1  (1.549 m).   Weight as of this encounter: 147 lb (66.7 kg).  _2 @  [employer]   06/11/18 1152  Weight: 147 lb (66.7 kg)     Physical Exam  General Appearance:    Alert, cooperative, no distress, appears stated age - older , Deconditioned looking - yes , OBESE  - no, Sitting on Wheelchair -  no  Head:    Normocephalic, without obvious abnormality, atraumatic  Eyes:    PERRL, conjunctiva/corneas clear,  Ears:    Normal TM's and external ear canals, both ears  Nose:   Nares normal, septum midline, mucosa normal, no drainage    or sinus tenderness. OXYGEN ON  - no . Patient is @ ra   Throat:   Lips, mucosa, and tongue normal; teeth and gums normal. Cyanosis on lips - no  Neck:   Supple, symmetrical, trachea midline, no adenopathy;    thyroid:  no enlargement/tenderness/nodules; no carotid   bruit or JVD  Back:     Symmetric, no curvature, ROM normal, no CVA tenderness  Lungs:     Distress - no , Wheeze no, Barrell Chest - no, Purse lip breathing - no, Crackles - yes R > L lung base   Chest Wall:    No tenderness or deformity.    Heart:    Regular rate and rhythm, S1 and S2 normal, no rub   or gallop, Murmur - no  Breast Exam:    NOT DONE  Abdomen:     Soft, non-tender, bowel sounds active all four quadrants,    no masses,  no organomegaly. Visceral obesity - yes; mild  Genitalia:   NOT DONE  Rectal:   NOT DONE  Extremities:   Extremities - normal, Has Cane - no, Clubbing - no, Edema - no  Pulses:   2+ and symmetric all extremities  Skin:   Stigmata of Connective Tissue Disease - no  Lymph nodes:   Cervical, supraclavicular, and axillary nodes normal  Psychiatric:  Neurologic:   Pleasant - yes, Anxious - no, Flat affect - no  CAm-ICU - neg, Alert and Oriented x 3 - yes, Moves all 4s - yes, Speech - normal, Cognition - intact           Assessment:       ICD-10-CM   1. 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 Interstitial lung disease ([place]) J84.9   3. BOOP (bronchiolitis obliterans with organizing pneumonia) ([place]) J84.89        Plan:     Patient Instructions     ICD-10-CM   1. 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 Interstitial lung disease ([place]) J84.9   3. BOOP (bronchiolitis obliterans with organizing pneumonia) ([place]) J84.89     I think prednisone helped you and is significantly better. There might  still be some residual scarring, postinflammatory fibrosis But at this time it is not bothering you and this is good news  Plan -We will adopt a monitoring strategy -Do repeat high-resolution CT scan of the chest in 6 months  Follow-up -Return to ILD clinic in 6 months but after CT scan of the chest  - after this do  CT as needed and pft every 6 months or so  - anti-fibrotic if ILD gets worse c/w INBUILD data  > 50% of this > 25 min visit spent in face to face counseling or coordination of care - by this undersigned MD - Dr Brand Males. This includes one or more of the following documented above: discussion of test results, diagnostic or treatment recommendations, prognosis, risks and benefits of management options, instructions, education, compliance or risk-factor reduction    SIGNATURE    Dr. Brand Males, M.D., F.C.C.P,  Pulmonary and Critical  Care Medicine Staff Physician, [facility] Director - Interstitial Lung Disease  Program  P[facility] [place], [place], [address]  Pager: [phone], If no answer or between  15:00h - 7:00h: call [phone] Telephone: [phone]  12:25 PM 06/11/2018

## 2018-06-11 NOTE — Patient Instructions (Addendum)
ICD-10-CM   1. 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 Interstitial lung disease ([place]) J84.9   3. BOOP (bronchiolitis obliterans with organizing pneumonia) ([place]) J84.89     I think prednisone helped you and is significantly better. There might still be some residual scarring, postinflammatory fibrosis But at this time it is not bothering you and this is good news  Plan -We will adopt a monitoring strategy -Do repeat high-resolution CT scan of the chest in 6 months  Follow-up -Return to ILD clinic in 6 months but after CT scan of the chest  - after this do  CT as needed and pft every 6 months or so  - anti-fibrotic if ILD gets worse c/w INBUILD data

## 2018-06-11 NOTE — Progress Notes (Signed)
Spirometry and Dlco done today. 

## 2018-06-15 ENCOUNTER — Encounter: Payer: Self-pay | Admitting: Cardiovascular Disease

## 2018-06-15 ENCOUNTER — Ambulatory Visit (INDEPENDENT_AMBULATORY_CARE_PROVIDER_SITE_OTHER): Payer: Medicare Other | Admitting: Cardiovascular Disease

## 2018-06-15 VITALS — BP 148/90 | HR 96 | Ht 61.0 in | Wt 148.0 lb

## 2018-06-15 DIAGNOSIS — I2510 Atherosclerotic heart disease of native coronary artery without angina pectoris: Secondary | ICD-10-CM

## 2018-06-15 DIAGNOSIS — I071 Rheumatic tricuspid insufficiency: Secondary | ICD-10-CM

## 2018-06-15 DIAGNOSIS — I38 Endocarditis, valve unspecified: Secondary | ICD-10-CM | POA: Diagnosis not present

## 2018-06-15 DIAGNOSIS — E785 Hyperlipidemia, unspecified: Secondary | ICD-10-CM

## 2018-06-15 DIAGNOSIS — I10 Essential (primary) hypertension: Secondary | ICD-10-CM

## 2018-06-15 DIAGNOSIS — I480 Paroxysmal atrial fibrillation: Secondary | ICD-10-CM

## 2018-06-15 DIAGNOSIS — I2584 Coronary atherosclerosis due to calcified coronary lesion: Secondary | ICD-10-CM | POA: Diagnosis not present

## 2018-06-15 MED ORDER — ATORVASTATIN CALCIUM 20 MG PO TABS: 20.0000 mg | ORAL_TABLET | Freq: Every day | ORAL | 3 refills | Status: DC

## 2018-06-15 MED ORDER — LOSARTAN POTASSIUM 25 MG PO TABS: 50.0000 mg | ORAL_TABLET | Freq: Two times a day (BID) | ORAL | 3 refills | Status: DC

## 2018-06-15 NOTE — Progress Notes (Signed)
SUBJECTIVE: The patient returns for follow-up after undergoing cardiovascular testing performed for the evaluation of coronary artery atherosclerosis, chest pain, and paroxysmal atrial fibrillation.  Nuclear stress test on 03/09/2018 was normal, LVEF 55 to 65%.  High resolution chest CT on 03/11/2018 showed coronary artery calcifications and aortic atherosclerosis.  It also showed possible postinfectious/postinflammatory fibrosis.  Event monitoring demonstrated sinus rhythm and sinus tachycardia.  There was no evidence of atrial fibrillation.  She speak Spanish.  She is here with her daughter and a Optometrist.  She denies chest pain and leg swelling.  She has episodic palpitations.  She has several questions including the utility of omega-3 capsules.     Review of Systems: As per "subjective", otherwise negative.  Allergies  Allergen Reactions  . Homatropine     Pt doesn't remember  . Hydromet [Hydrocodone-Homatropine]     Pt doesn't remember  . Amoxicillin Rash    Current Outpatient Medications  Medication Sig Dispense Refill  . amLODipine (NORVASC) 5 MG tablet Take 1 tablet (5 mg total) by mouth at bedtime. 30 tablet 2  . aspirin EC 81 MG tablet Take 1 tablet (81 mg total) by mouth daily. 90 tablet 3  . cetirizine (ZYRTEC) 10 MG tablet Take 1 tablet (10 mg total) by mouth daily as needed for allergies.    . Ipratropium-Albuterol (COMBIVENT RESPIMAT) 20-100 MCG/ACT AERS respimat Inhale 1 puff into the lungs every 6 (six) hours as needed for wheezing. 1 Inhaler 0  . levothyroxine (SYNTHROID, LEVOTHROID) 50 MCG tablet Take 1 tablet (50 mcg total) by mouth daily before breakfast. 90 tablet 1  . losartan (COZAAR) 25 MG tablet Take 0.5 tablets (12.5 mg total) by mouth 2 (two) times daily. 90 tablet 1  . Omega 3 1000 MG CAPS Take by mouth.     No current facility-administered medications for this visit.     Past Medical History:  Diagnosis Date  . Acute respiratory  failure ([place])   . CAP (community acquired pneumonia)   . Hyperlipidemia   . Hypertension   . Metabolic syndrome     Past Surgical History:  Procedure Laterality Date  . CESAREAN SECTION      Social History   Socioeconomic History  . Marital status: Married    Spouse name: Not on file  . Number of children: Not on file  . Years of education: Not on file  . Highest education level: Not on file  Occupational History  . Not on file  Social Needs  . Financial resource strain: Not hard at all  . Food insecurity:    Worry: Never true    Inability: Never true  . Transportation needs:    Medical: No    Non-medical: No  Tobacco Use  . Smoking status: Never Smoker  . Smokeless tobacco: Never Used  Substance and Sexual Activity  . Alcohol use: No  . Drug use: No  . Sexual activity: Not Currently    Birth control/protection: Post-menopausal  Lifestyle  . Physical activity:    Days per week: 7 days    Minutes per session: 30 min  . Stress: Only a little  Relationships  . Social connections:    Talks on phone: Never    Gets together: Never    Attends religious service: More than 4 times per year    Active member of club or organization: Yes    Attends meetings of clubs or organizations: More than 4 times per year  Relationship status: Married  . Intimate partner violence:    Fear of current or ex partner: No    Emotionally abused: No    Physically abused: No    Forced sexual activity: No  Other Topics Concern  . Not on file  Social History Narrative  . Not on file     Vitals:   06/15/18 1146  BP: (!) 148/90  Pulse: 96  SpO2: 98%  Weight: 148 lb (67.1 kg)  Height: 5\' 1"  (1.549 m)    Wt Readings from Last 3 Encounters:  06/15/18 148 lb (67.1 kg)  06/11/18 147 lb (66.7 kg)  04/30/18 143 lb (64.9 kg)     PHYSICAL EXAM General: NAD HEENT: Normal. Neck: No JVD, no thyromegaly. Lungs: Clear to auscultation bilaterally with normal respiratory effort. CV:  Regular rate and rhythm, normal S1/S2, no S3/S4, no murmur. No pretibial or periankle edema.  No carotid bruit.   Abdomen: Soft, nontender, no distention.  Neurologic: Alert and oriented.  Psych: Normal affect. Skin: Normal. Musculoskeletal: No gross deformities.    ECG: Reviewed above under Subjective   Labs: Lab Results  Component Value Date/Time   K 3.9 03/18/2018 02:37 PM   BUN 11 03/18/2018 02:37 PM   CREATININE 0.68 03/18/2018 02:37 PM   CREATININE 0.70 09/23/2012 01:35 PM   ALT 38 (H) 03/18/2018 02:37 PM   TSH 2.690 03/16/2018 03:07 PM   HGB 11.0 (L) 01/21/2018 12:48 PM     Lipids: Lab Results  Component Value Date/Time   LDLCALC 112 (H) 03/18/2018 02:37 PM   LDLCALC 118 (H) 05/04/2013 03:28 PM   LDLCALC 116 (H) 09/23/2012 01:35 PM   LDLDIRECT 77 07/11/2016 03:36 PM   CHOL 193 03/18/2018 02:37 PM   CHOL 180 09/23/2012 01:35 PM   TRIG 265 (H) 03/18/2018 02:37 PM   TRIG 108 08/10/2014 08:52 AM   TRIG 135 09/23/2012 01:35 PM   HDL 28 (L) 03/18/2018 02:37 PM   HDL 40 08/10/2014 08:52 AM   HDL 37 (L) 09/23/2012 01:35 PM       ASSESSMENT AND PLAN:  1.  Coronary artery atherosclerosis/chest pain: Symptomatically stable and without chest pain recurrence.  Nuclear stress test was normal on 03/09/2018.  Coronary atherosclerosis noted on chest CT as detailed above.  Continue aspirin.  I will start low-dose atorvastatin 20 mg.  2.  Accelerated hypertension: Blood pressure remains elevated.    It was 150/78 at the pulmonology office on 06/11/2018.  Currently on carvedilol 3.125 mg twice daily and low-dose losartan 12.5 mg twice daily.    I will increase losartan to 50 mg every morning.  I will have her take amlodipine 5 mg every evening.  3.  Valvular heart disease: Mild mitral and severe tricuspid regurgitation.  Severe tricuspid regurgitation with moderately elevated pulmonary pressures likely secondary to interstitial lung disease.  4.  Paroxysmal atrial fibrillation:  Currently on carvedilol.    Event monitor results reviewed above with no evidence of recurrent atrial fibrillation.  Left atrium normal in size by echocardiogram.    Continue aspirin.  It is possible that the episode which occurred while hospitalized was due to acute pulmonary stress.  If she does have demonstrated recurrence, she will need to be on systemic anticoagulation.  5.  Hyperlipidemia: Lipid panel reviewed above with LDL of 112 on 03/18/2018.  I will start low-dose atorvastatin 20 mg given coronary artery atherosclerosis noted on high-resolution CT scan.   Disposition: Follow up 1 year   Kate Sable, M.D.,  F.A.C.C. 

## 2018-06-15 NOTE — Patient Instructions (Signed)
Medication Instructions:  START Atorvastatin 20 mg daily at dinner  INCREASE Losartan to 50 mg every morning  TAKE Amlodipine daily at bedtime  If you need a refill on your cardiac medications before your next appointment, please call your pharmacy.   Lab work: None today If you have labs (blood work) drawn today and your tests are completely normal, you will receive your results only by: Marland Kitchen MyChart Message (if you have MyChart) OR . A paper copy in the mail If you have any lab test that is abnormal or we need to change your treatment, we will call you to review the results.  Testing/Procedures: None today  Follow-Up: At [facility], you and your health needs are our priority.  As part of our continuing mission to provide you with exceptional heart care, we have created designated Provider Care Teams.  These Care Teams include your primary Cardiologist (physician) and Advanced Practice Providers (APPs -  Physician Assistants and Nurse Practitioners) who all work together to provide you with the care you need, when you need it. You will need a follow up appointment in 12 months.  Please call our office 2 months in advance to schedule this appointment.  You may see Kate Sable, MD or one of the following Advanced Practice Providers on your designated Care Team:   Bernerd Pho, PA-C [facility]) . Ermalinda Barrios, PA-C ([place])  Any Other Special Instructions Will Be Listed Below (If Applicable). None

## 2018-07-24 ENCOUNTER — Ambulatory Visit: Payer: Medicare Other | Admitting: Family Medicine

## 2018-07-24 ENCOUNTER — Other Ambulatory Visit: Payer: Self-pay | Admitting: Family Medicine

## 2018-07-24 DIAGNOSIS — I10 Essential (primary) hypertension: Secondary | ICD-10-CM

## 2018-08-27 ENCOUNTER — Ambulatory Visit (INDEPENDENT_AMBULATORY_CARE_PROVIDER_SITE_OTHER): Payer: Medicare Other | Admitting: Family Medicine

## 2018-08-27 ENCOUNTER — Encounter: Payer: Self-pay | Admitting: Family Medicine

## 2018-08-27 ENCOUNTER — Other Ambulatory Visit: Payer: Self-pay

## 2018-08-27 DIAGNOSIS — I10 Essential (primary) hypertension: Secondary | ICD-10-CM | POA: Diagnosis not present

## 2018-08-27 DIAGNOSIS — E785 Hyperlipidemia, unspecified: Secondary | ICD-10-CM

## 2018-08-27 DIAGNOSIS — E039 Hypothyroidism, unspecified: Secondary | ICD-10-CM

## 2018-08-27 DIAGNOSIS — R7303 Prediabetes: Secondary | ICD-10-CM | POA: Diagnosis not present

## 2018-08-27 DIAGNOSIS — I2584 Coronary atherosclerosis due to calcified coronary lesion: Secondary | ICD-10-CM

## 2018-08-27 DIAGNOSIS — I2510 Atherosclerotic heart disease of native coronary artery without angina pectoris: Secondary | ICD-10-CM | POA: Diagnosis not present

## 2018-08-27 MED ORDER — LOSARTAN POTASSIUM 25 MG PO TABS: 50.0000 mg | ORAL_TABLET | Freq: Every day | ORAL | 3 refills | Status: DC

## 2018-08-27 MED ORDER — AMLODIPINE BESYLATE 5 MG PO TABS: 5.0000 mg | ORAL_TABLET | Freq: Every day | ORAL | 3 refills | Status: DC

## 2018-08-27 MED ORDER — LEVOTHYROXINE SODIUM 50 MCG PO TABS: 50.0000 ug | ORAL_TABLET | Freq: Every day | ORAL | 3 refills | Status: DC

## 2018-08-27 MED ORDER — ATORVASTATIN CALCIUM 20 MG PO TABS: 20.0000 mg | ORAL_TABLET | Freq: Every day | ORAL | 3 refills | Status: DC

## 2018-08-27 MED ORDER — CETIRIZINE HCL 10 MG PO TABS: 10.0000 mg | ORAL_TABLET | Freq: Every day | ORAL | 3 refills | Status: DC | PRN

## 2018-08-27 NOTE — Progress Notes (Signed)
Virtual Visit via telephone Note  I connected with Tasha [name] on 08/27/18 at 0837 by telephone and verified that I am speaking with the correct person using two identifiers. Tasha [name] is currently located at home and husband are currently with her during visit. The provider, Fransisca Kaufmann , MD is located in their office at time of visit.  Call ended at [phone]  I discussed the limitations, risks, security and privacy concerns of performing an evaluation and management service by telephone and the availability of in person appointments. I also discussed with the patient that there may be a patient responsible charge related to this service. The patient expressed understanding and agreed to proceed.  Weight 145pounds History and Present Illness: Hypertension Patient is currently on amlodipine and losartan, and their blood pressure at home 137/82, 154/82, 124/75, 153/87, 154/85, 128/105, . Patient denies any lightheadedness or dizziness. Patient denies headaches, blurred vision, chest pains, shortness of breath, or weakness. Denies any side effects from medication and is content with current medication.   Prediabetes  Patient comes in today for recheck of his diabetes. Patient has been currently taking no medication. Patient is currently on an ACE inhibitor/ARB. Patient has not seen an ophthalmologist this year. Patient denies any issues with their feet.   Hyperlipidemia Patient is coming in for recheck of his hyperlipidemia. The patient is currently taking atorvastatin. They deny any issues with myalgias or history of liver damage from it. They deny any focal numbness or weakness or chest pain.   Hypothyroidism recheck Patient is coming in for thyroid recheck today as well. They deny any issues with hair changes or heat or cold problems or diarrhea or constipation. They deny any chest pain or palpitations. They are currently on levothyroxine 50 micrograms   No diagnosis found.   Outpatient Encounter Medications as of 08/27/2018  Medication Sig  . amLODipine (NORVASC) 5 MG tablet TAKE 1 TABLET BY MOUTH AT BEDTIME.  Marland Kitchen aspirin EC 81 MG tablet Take 1 tablet (81 mg total) by mouth daily.  Marland Kitchen atorvastatin (LIPITOR) 20 MG tablet Take 1 tablet (20 mg total) by mouth daily at 6 PM.  . cetirizine (ZYRTEC) 10 MG tablet Take 1 tablet (10 mg total) by mouth daily as needed for allergies.  . Ipratropium-Albuterol (COMBIVENT RESPIMAT) 20-100 MCG/ACT AERS respimat Inhale 1 puff into the lungs every 6 (six) hours as needed for wheezing.  Marland Kitchen levothyroxine (SYNTHROID, LEVOTHROID) 50 MCG tablet Take 1 tablet (50 mcg total) by mouth daily before breakfast.  . losartan (COZAAR) 25 MG tablet Take 2 tablets (50 mg total) by mouth 2 (two) times daily.  . Omega 3 1000 MG CAPS Take by mouth.   No facility-administered encounter medications on file as of 08/27/2018.     Review of Systems  Constitutional: Negative for chills and fever.  Eyes: Negative for visual disturbance.  Respiratory: Negative for chest tightness and shortness of breath.   Cardiovascular: Negative for chest pain and leg swelling.  Musculoskeletal: Negative for back pain and gait problem.  Skin: Negative for rash.  Neurological: Negative for light-headedness and headaches.  Psychiatric/Behavioral: Negative for agitation and behavioral problems.  All other systems reviewed and are negative.   Observations/Objective: Patient sounds comfortable and in no acute distress  Assessment and Plan: Problem List Items Addressed This Visit      Cardiovascular and Mediastinum   Hypertension   Relevant Medications   losartan (COZAAR) 25 MG tablet   amLODipine (NORVASC) 5 MG tablet   atorvastatin (  LIPITOR) 20 MG tablet     Endocrine   Hypothyroidism   Relevant Medications   levothyroxine (SYNTHROID) 50 MCG tablet     Other   Hyperlipidemia with target LDL less than 100   Relevant Medications   losartan (COZAAR) 25 MG tablet    amLODipine (NORVASC) 5 MG tablet   atorvastatin (LIPITOR) 20 MG tablet   Prediabetes - Primary       Follow Up Instructions:  Continue current medications and follow-up with labs in 3 months   I discussed the assessment and treatment plan with the patient. The patient was provided an opportunity to ask questions and all were answered. The patient agreed with the plan and demonstrated an understanding of the instructions.   The patient was advised to call back or seek an in-person evaluation if the symptoms worsen or if the condition fails to improve as anticipated.  The above assessment and management plan was discussed with the patient. The patient verbalized understanding of and has agreed to the management plan. Patient is aware to call the clinic if symptoms persist or worsen. Patient is aware when to return to the clinic for a follow-up visit. Patient educated on when it is appropriate to go to the emergency department.    I provided 20 minutes of non-face-to-face time during this encounter.    Worthy Rancher, MD

## 2018-10-28 ENCOUNTER — Other Ambulatory Visit: Payer: Self-pay | Admitting: Internal Medicine

## 2018-11-23 IMAGING — CT CT CHEST HIGH RESOLUTION W/O CM
2 of 5 series · 15 of 36 positions shown, 18 images · non-contrast
Comparison: 12/17/2017.

CLINICAL DATA: Abnormal high-resolution chest CT, possible
pneumonia.

EXAM:
CT CHEST WITHOUT CONTRAST
TECHNIQUE: Multidetector CT imaging of the chest was performed following the
standard protocol without intravenous contrast. High resolution
imaging of the lungs, as well as inspiratory and expiratory imaging,
was performed.

[Series 9: coronal · coronal · 0.56mm/px · 3 of 108 slices shown]
[im 22/108  lung]
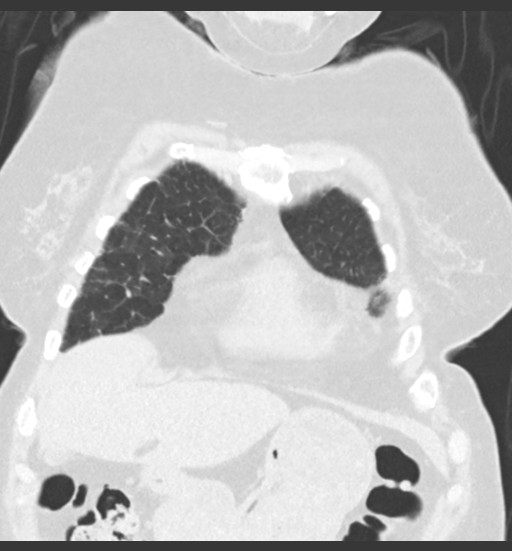
[im 43/108  lung]
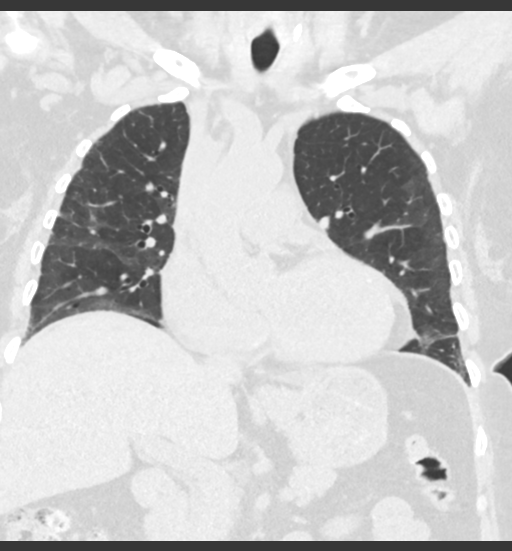
[im 65/108  lung]
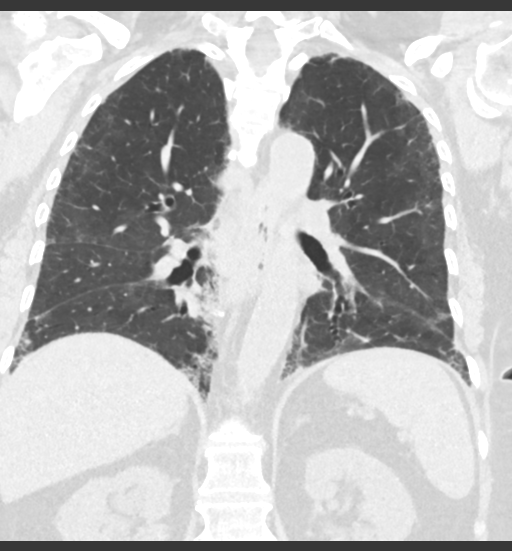

[Series 11: thorax · axial · 0.63mm/px · z∈[+1158,+1428]mm · 12 of 149 slices shown, 15 images]
[im 7/149  mediastinal]
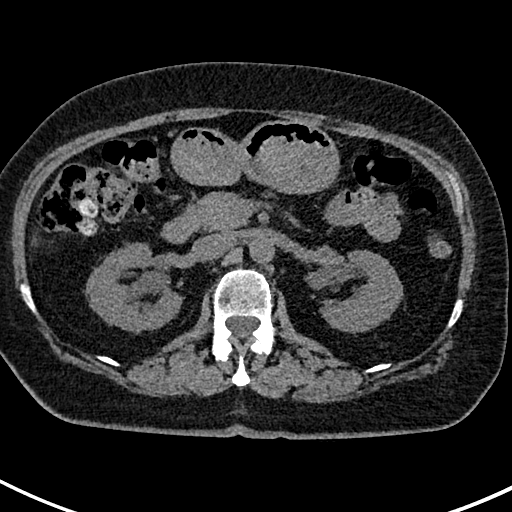
[im 7/149  lung]
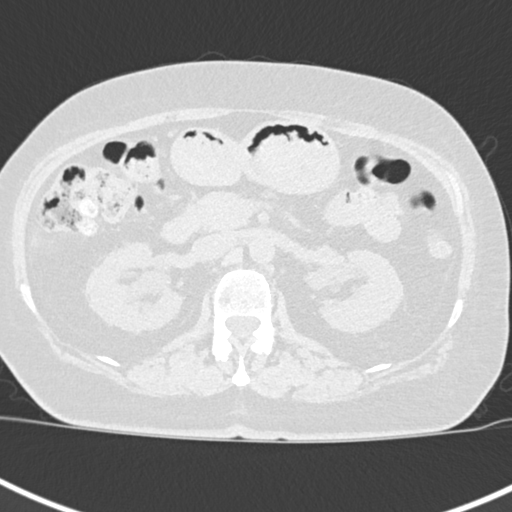
[im 21/149  lung]
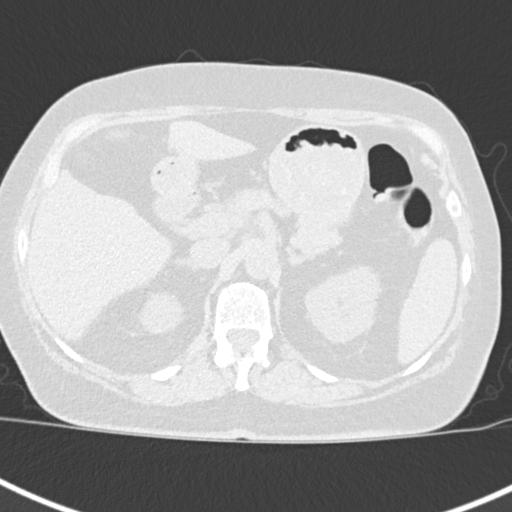
[im 34/149  lung]
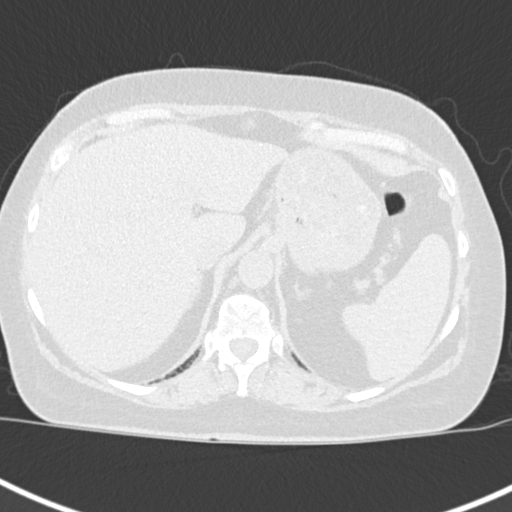
[im 48/149  lung]
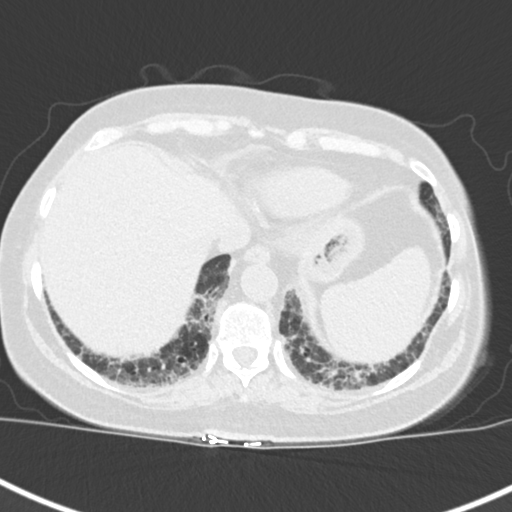
[im 54/149  mediastinal]
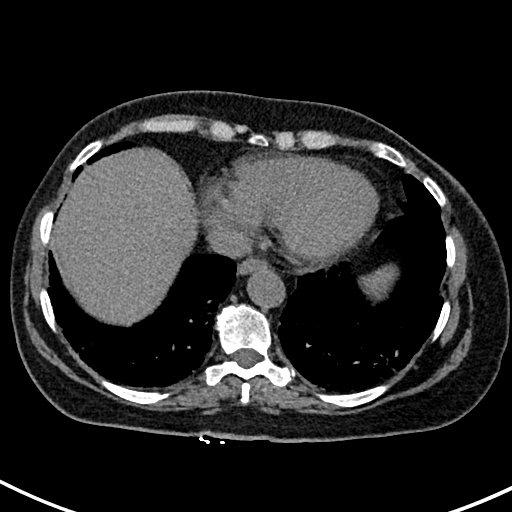
[im 54/149  lung]
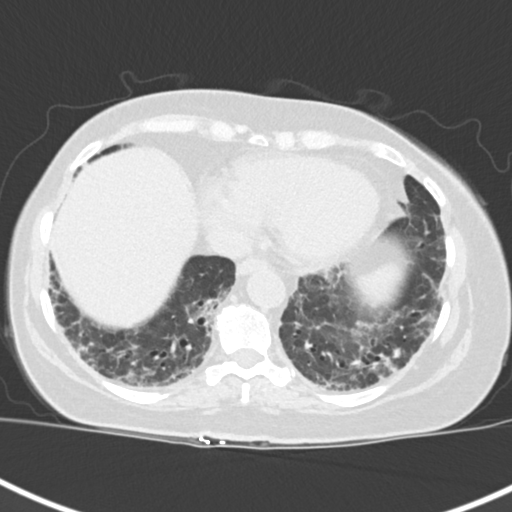
[im 68/149  lung]
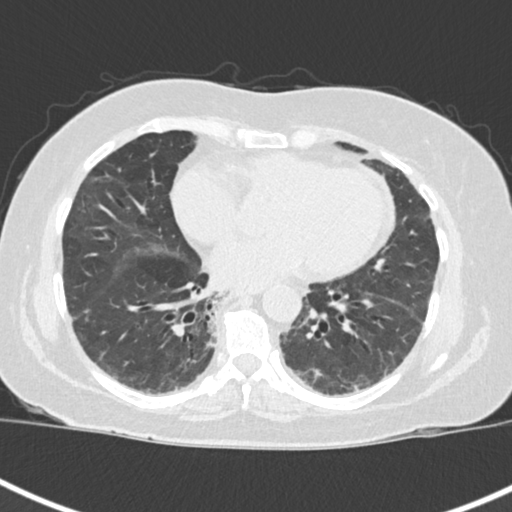
[im 81/149  lung]
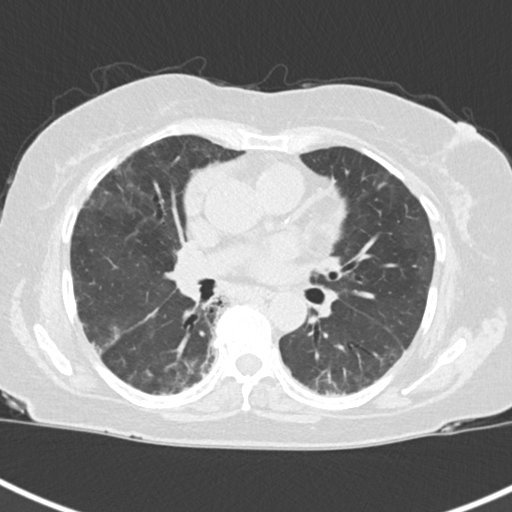
[im 95/149  lung]
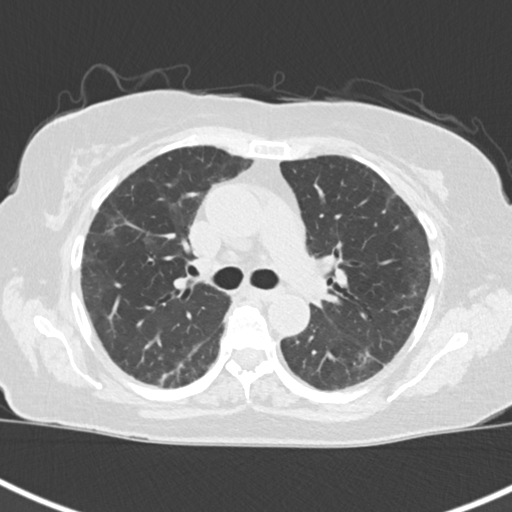
[im 101/149  mediastinal]
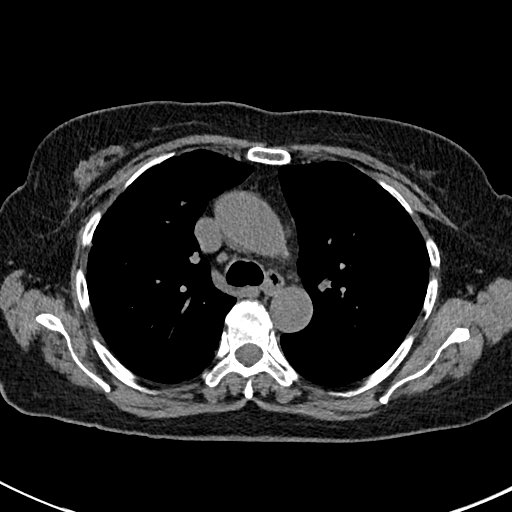
[im 101/149  lung]
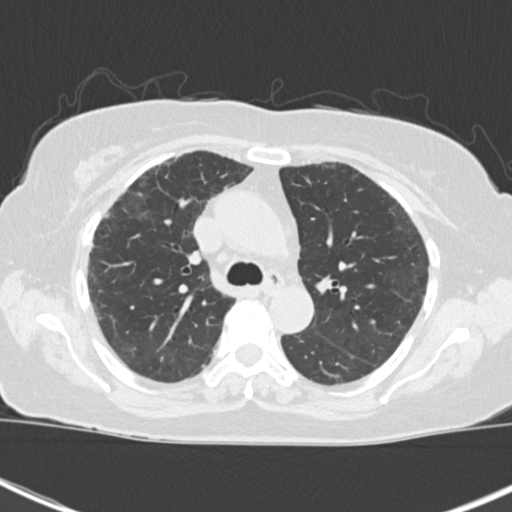
[im 115/149  lung]
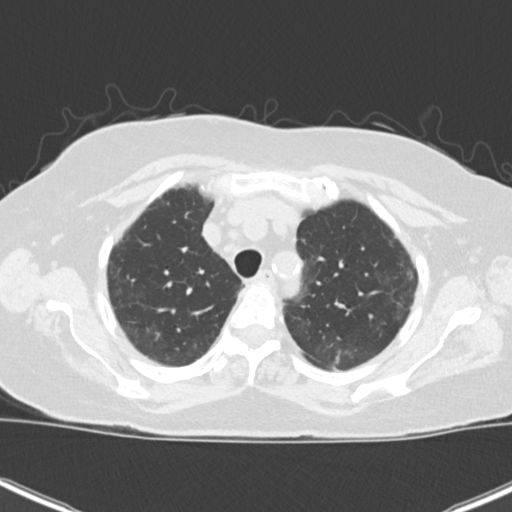
[im 128/149  lung]
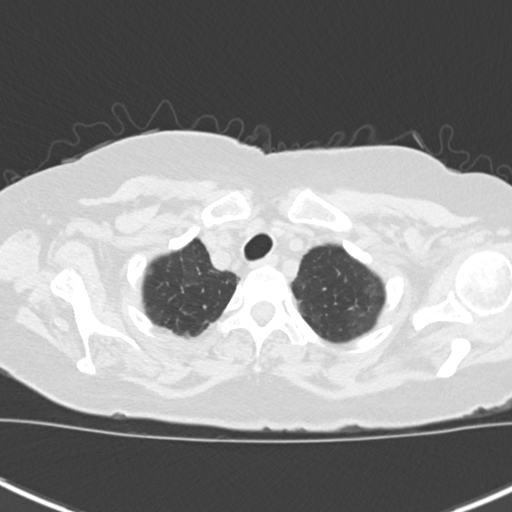
[im 142/149  lung]
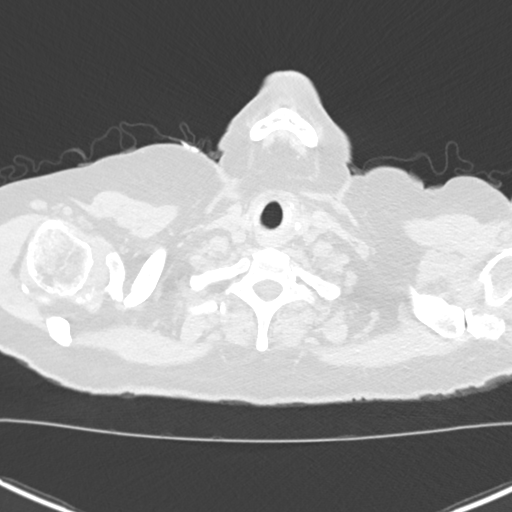

[15 of 36 positions shown; findings below may reference images not displayed]

FINDINGS: Cardiovascular: Atherosclerotic calcification of the arterial
vasculature, including coronary arteries. Heart is at the upper
limits of normal in size to mildly enlarged. No pericardial
effusion.

Mediastinum/Nodes: Mediastinal lymph nodes are not enlarged by CT
size criteria. Hilar regions are difficult to definitively evaluate
without IV contrast. Calcified left hilar lymph nodes and subcarinal
lymph nodes. No axillary adenopathy. Esophagus is grossly
unremarkable.

Lungs/Pleura: Much improved peripheral and basilar consolidation
with residual subpleural ground-glass and mild subpleural
reticulation, as well as bronchiectasis/bronchiolectasis. No
honeycombing. Calcified granuloma in the left lower lobe. No pleural
fluid. Airway is otherwise unremarkable. No air trapping.

Upper Abdomen: Liver, gallbladder and adrenal glands are
unremarkable. There may be renal sinus cysts bilaterally. Possible
punctate stone in the right kidney. Spleen and visualized portions
of the pancreas, stomach and bowel are grossly unremarkable. No
upper abdominal adenopathy.

Musculoskeletal: No worrisome lytic or sclerotic lesions.
IMPRESSION: 1. Residual subpleural and basilar predominant ground-glass, mild
subpleural reticulation and associated
bronchiectasis/bronchiolectasis, findings which may represent post
infectious/postinflammatory fibrosis. Nonspecific interstitial
pneumonitis is not excluded. Findings are indeterminate for UIP per
consensus guidelines: Diagnosis of Idiopathic Pulmonary Fibrosis: An
Official ATS/ERS/JRS/ALAT Clinical Practice Guideline. Am J Respir
Crit Care Med Vol 198, [place] 5, [id number][DATE].
2. Possible punctate stone in the right kidney.
3. Aortic atherosclerosis ([id number]-170.0). Coronary artery
calcification.

## 2018-11-24 ENCOUNTER — Other Ambulatory Visit: Payer: Self-pay

## 2018-11-25 ENCOUNTER — Ambulatory Visit (INDEPENDENT_AMBULATORY_CARE_PROVIDER_SITE_OTHER): Payer: Medicare Other | Admitting: Family Medicine

## 2018-11-25 ENCOUNTER — Other Ambulatory Visit: Payer: Self-pay

## 2018-11-25 ENCOUNTER — Encounter: Payer: Self-pay | Admitting: Family Medicine

## 2018-11-25 VITALS — BP 171/91 | HR 88 | Temp 99.1°F | Ht 61.0 in | Wt 151.0 lb

## 2018-11-25 DIAGNOSIS — G8929 Other chronic pain: Secondary | ICD-10-CM | POA: Diagnosis not present

## 2018-11-25 DIAGNOSIS — M25511 Pain in right shoulder: Secondary | ICD-10-CM

## 2018-11-25 DIAGNOSIS — S80862A Insect bite (nonvenomous), left lower leg, initial encounter: Secondary | ICD-10-CM

## 2018-11-25 DIAGNOSIS — R5383 Other fatigue: Secondary | ICD-10-CM

## 2018-11-25 DIAGNOSIS — A770 Spotted fever due to Rickettsia rickettsii: Secondary | ICD-10-CM | POA: Diagnosis not present

## 2018-11-25 DIAGNOSIS — E039 Hypothyroidism, unspecified: Secondary | ICD-10-CM | POA: Diagnosis not present

## 2018-11-25 DIAGNOSIS — M25512 Pain in left shoulder: Secondary | ICD-10-CM | POA: Diagnosis not present

## 2018-11-25 DIAGNOSIS — R0602 Shortness of breath: Secondary | ICD-10-CM

## 2018-11-25 DIAGNOSIS — W57XXXA Bitten or stung by nonvenomous insect and other nonvenomous arthropods, initial encounter: Secondary | ICD-10-CM

## 2018-11-25 DIAGNOSIS — I10 Essential (primary) hypertension: Secondary | ICD-10-CM | POA: Diagnosis not present

## 2018-11-25 DIAGNOSIS — B351 Tinea unguium: Secondary | ICD-10-CM | POA: Diagnosis not present

## 2018-11-25 DIAGNOSIS — R109 Unspecified abdominal pain: Secondary | ICD-10-CM | POA: Diagnosis not present

## 2018-11-25 DIAGNOSIS — R768 Other specified abnormal immunological findings in serum: Secondary | ICD-10-CM

## 2018-11-25 DIAGNOSIS — F339 Major depressive disorder, recurrent, unspecified: Secondary | ICD-10-CM | POA: Diagnosis not present

## 2018-11-25 DIAGNOSIS — M7989 Other specified soft tissue disorders: Secondary | ICD-10-CM | POA: Diagnosis not present

## 2018-11-25 LAB — URINALYSIS, COMPLETE
Bilirubin, UA: NEGATIVE
Glucose, UA: NEGATIVE
Ketones, UA: NEGATIVE
Leukocytes,UA: NEGATIVE
Nitrite, UA: NEGATIVE
Protein,UA: NEGATIVE
RBC, UA: NEGATIVE
Specific Gravity, UA: 1.02 (ref 1.005–1.030)
Urobilinogen, Ur: 0.2 mg/dL (ref 0.2–1.0)
pH, UA: 6.5 (ref 5.0–7.5)

## 2018-11-25 LAB — MICROSCOPIC EXAMINATION
Bacteria, UA: NONE SEEN
RBC, Urine: NONE SEEN /hpf (ref 0–2)
Renal Epithel, UA: NONE SEEN /hpf

## 2018-11-25 MED ORDER — EFINACONAZOLE 10 % EX SOLN: 1.0000 "application " | Freq: Every day | CUTANEOUS | 6 refills | Status: AC

## 2018-11-25 MED ORDER — HYDROCHLOROTHIAZIDE 12.5 MG PO TABS: 12.5000 mg | ORAL_TABLET | Freq: Every day | ORAL | 3 refills | Status: DC

## 2018-11-25 MED ORDER — DULOXETINE HCL 20 MG PO CPEP: 20.0000 mg | ORAL_CAPSULE | Freq: Every day | ORAL | 0 refills | Status: DC

## 2018-11-25 NOTE — Progress Notes (Signed)
Subjective:  [name], female    DOB: January 11, 1951, 68 y.o.   MRN: [id number]  Patient Care Team: Dettinger, Fransisca Kaufmann, MD as PCP - General (Family Medicine) Herminio Commons, MD as PCP - Cardiology (Cardiology)   [employer] video interpreter [place], [place] used for translation of this visit  Chief Complaint:  Foot Swelling, Flank Pain, and Pain (shoulder, legs and heel)   HPI: Tasha [name] is a 68 y.o. female presenting on 11/25/2018 for Foot Swelling, Flank Pain, and Pain (shoulder, legs and heel)   1. Essential hypertension  Complaint with meds - Yes Current Medications - Losartan, Norvasc Checking BP at home - No Exercising Regularly - No Watching Salt intake - No Pertinent ROS:  Headache - No Fatigue - Yes Visual Disturbances - No Chest pain - No Dyspnea - Yes Palpitations - No LE edema - Yes They report good compliance with medications and can restate their regimen by memory. No medication side effects.  Family, social, and smoking history reviewed.   BP Readings from Last 3 Encounters:  11/25/18 (!) 171/91  06/15/18 (!) 148/90  06/11/18 (!) 150/78   CMP Latest Ref Rng & Units 03/18/2018 01/21/2018 01/09/2018  Glucose 65 - 99 mg/dL 83 119(H) 134(H)  BUN 8 - 27 mg/dL 11 18 29(H)  Creatinine 0.57 - 1.00 mg/dL 0.68 0.87 0.92  Sodium 134 - 144 mmol/L 145(H) 142 140  Potassium 3.5 - 5.2 mmol/L 3.9 4.7 3.9  Chloride 96 - 106 mmol/L 106 104 108  CO2 20 - 29 mmol/L 24 23 23   Calcium 8.7 - 10.3 mg/dL 9.5 9.2 8.5(L)  Total Protein 6.0 - 8.5 g/dL 6.5 6.0 4.6(L)  Total Bilirubin 0.0 - 1.2 mg/dL 0.3 0.4 0.8  Alkaline Phos 39 - 117 IU/L 55 44 36(L)  AST 0 - 40 IU/L 33 17 25  ALT 0 - 32 IU/L 38(H) 26 51(H)      2. Acquired hypothyroidism  Compliant with medications - Yes Current medications - Synthroid 50 mcg Adverse side effects - No Bowel habit changes - No Heat or cold intolerance - No Mood changes - Yes Changes in sleep habits -  No Fatigue - Yes Skin, hair, or nail changes - No Tremor - No Palpitations - No Edema - Yes Shortness of breath - Yes Lab Results  Component Value Date   TSH 2.690 03/16/2018     3. Other fatigue  Pt reports increasing fatigue over the last several weeks. States she feels extremely tired all of the time. Sleeping does not relieve the fatigue.    4. Exertional shortness of breath  Pt reports exertional shortness of breath. States this started about 3-4 weeks ago. States she gets short of breath with any activity and is relieved by rest.  No chest pain, palpitations, PND, orthopnea, dizziness, or syncope. She does report lower extremity edema with the shortness of breath.    5. Left flank pain  Pt reports left flak pain that started a few days ago. States the pain is dull and does not radiate. 3/10. Nothing aggravates or alleviates the pain. No urinary symptoms. No vaginal symptoms. No fever, chills, weakness, or confusion. Does have fatigue. Has not tried anything for the pain.    6. Chronic pain of both shoulders  Ongoing aching of both shoulders. States started several months ago. Pain waxes and wanes. No injury. States the pain is worse with raising arms above head. Resting and heat help with the pain.  She denies numbness, tingling, weakness, or loss of function.    7. Tick bite of left lower leg, initial encounter  Removed a tick from her left lower leg 4 days ago. No fever, chills, weakness, or confusion. Has had fatigue and arthralgias. These symptoms started prior to the tick bite. No rash associated with tick bite. No headache.    8. Swelling of lower leg  Pt reports bilateral lower extremity edema at the end of each day. States the swelling starts after being on her feet all day. States the swelling is gone when she wakes in the morning. No associated erythema.    9. Onychomycosis of toenail  Pt complains of thickening and discoloration of her toenails. States this has been  ongoing for several weeks and is getting worse. She has not tried anything for this.    10. Depression, recurrent ([place])  Pt reports worsening in depression symptoms. States she has not been taking anything for her depression. She denies SI or HI. States she has had increasing symptoms over the last 6 months. States symptoms occur daily. Depression screen [facility] 2/9 11/25/2018 11/25/2018 04/24/2018 02/18/2018 02/16/2018  Decreased Interest 0 0 0 0 0  Down, Depressed, Hopeless 0 0 0 0 0  PHQ - 2 Score 0 0 0 0 0  Altered sleeping 1 - - - -  Tired, decreased energy 3 - - - -  Change in appetite 1 - - - -  Feeling bad or failure about yourself  0 - - - -  Trouble concentrating 0 - - - -  Moving slowly or fidgety/restless 0 - - - -  Suicidal thoughts 0 - - - -  PHQ-9 Score 5 - - - -  Difficult doing work/chores - - - - -  Some recent data might be hidden        Relevant past medical, surgical, family, and social history reviewed and updated as indicated.  Allergies and medications reviewed and updated. Date reviewed: Chart in Epic.   Past Medical History:  Diagnosis Date   Acute respiratory failure (HCC)    CAP (community acquired pneumonia)    Hyperlipidemia    Hypertension    Metabolic syndrome     Past Surgical History:  Procedure Laterality Date   CESAREAN SECTION      Social History   Socioeconomic History   Marital status: Married    Spouse name: Not on file   Number of children: Not on file   Years of education: Not on file   Highest education level: Not on file  Occupational History   Not on file  Social Needs   Financial resource strain: Not hard at all   Food insecurity    Worry: Never true    Inability: Never true   Transportation needs    Medical: No    Non-medical: No  Tobacco Use   Smoking status: Never Smoker   Smokeless tobacco: Never Used  Substance and Sexual Activity   Alcohol use: No   Drug use: No   Sexual activity: Not  Currently    Birth control/protection: Post-menopausal  Lifestyle   Physical activity    Days per week: 7 days    Minutes per session: 30 min   Stress: Only a little  Relationships   Social connections    Talks on phone: Never    Gets together: Never    Attends religious service: More than 4 times per year    Active member of club  or organization: Yes    Attends meetings of clubs or organizations: More than 4 times per year    Relationship status: Married   Intimate partner violence    Fear of current or ex partner: No    Emotionally abused: No    Physically abused: No    Forced sexual activity: No  Other Topics Concern   Not on file  Social History Narrative   Not on file    Outpatient Encounter Medications as of 11/25/2018  Medication Sig   amLODipine (NORVASC) 5 MG tablet Take 1 tablet (5 mg total) by mouth at bedtime.   aspirin EC 81 MG tablet Take 1 tablet (81 mg total) by mouth daily.   atorvastatin (LIPITOR) 20 MG tablet Take 1 tablet (20 mg total) by mouth daily at 6 PM.   cetirizine (ZYRTEC) 10 MG tablet Take 1 tablet (10 mg total) by mouth daily as needed for allergies.   Ipratropium-Albuterol (COMBIVENT RESPIMAT) 20-100 MCG/ACT AERS respimat Inhale 1 puff into the lungs every 6 (six) hours as needed for wheezing.   levothyroxine (SYNTHROID) 50 MCG tablet Take 1 tablet (50 mcg total) by mouth daily before breakfast.   Omega 3 1000 MG CAPS Take by mouth.   DULoxetine (CYMBALTA) 20 MG capsule Take 1 capsule (20 mg total) by mouth daily.   Efinaconazole 10 % SOLN Apply 1 application topically daily.   hydrochlorothiazide (HYDRODIURIL) 12.5 MG tablet Take 1 tablet (12.5 mg total) by mouth daily.   losartan (COZAAR) 50 MG tablet Take 1 tablet by mouth daily.   [DISCONTINUED] losartan (COZAAR) 25 MG tablet Take 2 tablets (50 mg total) by mouth daily.   No facility-administered encounter medications on file as of 11/25/2018.     Allergies  Allergen  Reactions   Homatropine     Pt doesn't remember   Hydromet [Hydrocodone-Homatropine]     Pt doesn't remember   Amoxicillin Rash    Review of Systems  Constitutional: Positive for activity change and fatigue. Negative for appetite change, chills, diaphoresis, fever and unexpected weight change.  Eyes: Negative for photophobia and visual disturbance.  Respiratory: Positive for shortness of breath. Negative for apnea, cough and chest tightness.   Cardiovascular: Positive for leg swelling. Negative for chest pain and palpitations.  Gastrointestinal: Negative for abdominal distention, abdominal pain, anal bleeding, blood in stool, constipation, diarrhea, nausea, rectal pain and vomiting.  Endocrine: Negative for cold intolerance, heat intolerance, polydipsia, polyphagia and polyuria.  Genitourinary: Positive for flank pain (left). Negative for decreased urine volume, difficulty urinating, dyspareunia, dysuria, frequency, hematuria, pelvic pain, urgency, vaginal bleeding, vaginal discharge and vaginal pain.  Musculoskeletal: Positive for arthralgias, back pain and myalgias. Negative for gait problem, joint swelling, neck pain and neck stiffness.  Skin: Negative for color change, pallor and rash.  Neurological: Negative for dizziness, tremors, seizures, weakness, light-headedness and headaches.  Hematological: Does not bruise/bleed easily.  Psychiatric/Behavioral: Positive for decreased concentration, dysphoric mood and sleep disturbance. Negative for agitation, behavioral problems, confusion, hallucinations, self-injury and suicidal ideas. The patient is not nervous/anxious and is not hyperactive.   All other systems reviewed and are negative.       Objective:  BP (!) 171/91    Pulse 88    Temp 99.1 F (37.3 C)    Ht 5' 1"  (1.549 m)    Wt 151 lb (68.5 kg)    BMI 28.53 kg/m    Wt Readings from Last 3 Encounters:  11/25/18 151 lb (68.5 kg)  06/15/18 148 lb (  67.1 kg)  06/11/18 147 lb  (66.7 kg)    Physical Exam Vitals signs and nursing note reviewed.  Constitutional:      General: She is not in acute distress.    Appearance: Normal appearance. She is well-developed, well-groomed and overweight. She is not ill-appearing, toxic-appearing or diaphoretic.  HENT:     Head: Normocephalic and atraumatic.     Jaw: There is normal jaw occlusion.     Right Ear: Hearing, tympanic membrane, ear canal and external ear normal.     Left Ear: Hearing, tympanic membrane, ear canal and external ear normal.     Nose: Nose normal.     Mouth/Throat:     Lips: Pink.     Mouth: Mucous membranes are moist.     Pharynx: Oropharynx is clear. Uvula midline.  Eyes:     General: Lids are normal.     Extraocular Movements: Extraocular movements intact.     Conjunctiva/sclera: Conjunctivae normal.     Pupils: Pupils are equal, round, and reactive to light.  Neck:     Musculoskeletal: Normal range of motion and neck supple.     Thyroid: No thyroid mass, thyromegaly or thyroid tenderness.     Vascular: No carotid bruit or JVD.     Trachea: Trachea and phonation normal.  Cardiovascular:     Rate and Rhythm: Normal rate and regular rhythm.     Chest Wall: PMI is not displaced.     Pulses: Normal pulses.          Dorsalis pedis pulses are 2+ on the right side and 2+ on the left side.       Posterior tibial pulses are 2+ on the right side and 2+ on the left side.     Heart sounds: Normal heart sounds. No murmur. No friction rub. No gallop.   Pulmonary:     Effort: Pulmonary effort is normal. No respiratory distress.     Breath sounds: Normal breath sounds. No wheezing.  Abdominal:     General: Abdomen is protuberant. Bowel sounds are normal. There is no distension or abdominal bruit.     Palpations: Abdomen is soft. There is no hepatomegaly or splenomegaly.     Tenderness: There is no abdominal tenderness. There is no right CVA tenderness or left CVA tenderness.     Hernia: No hernia is  present.  Musculoskeletal: Normal range of motion.     Right shoulder: She exhibits tenderness. She exhibits normal range of motion, no bony tenderness, no swelling, no effusion, no crepitus, no deformity, no laceration, no pain, no spasm, normal pulse and normal strength.     Left shoulder: She exhibits tenderness. She exhibits normal range of motion, no bony tenderness, no swelling, no effusion, no crepitus, no deformity, no laceration, no pain, no spasm, normal pulse and normal strength.     Right elbow: Normal.    Left elbow: Normal.     Cervical back: Normal.     Right lower leg: No edema.     Left lower leg: No edema.  Feet:     Right foot:     Skin integrity: Skin integrity normal.     Toenail Condition: Right toenails are abnormally thick. Fungal disease present.    Left foot:     Skin integrity: Skin integrity normal.     Toenail Condition: Left toenails are abnormally thick. Fungal disease present. Lymphadenopathy:     Cervical: No cervical adenopathy.  Skin:    General: Skin  is warm and dry.     Capillary Refill: Capillary refill takes less than 2 seconds.     Coloration: Skin is not cyanotic, jaundiced or pale.     Findings: No erythema, lesion or rash.  Neurological:     General: No focal deficit present.     Mental Status: She is alert and oriented to person, place, and time.     Cranial Nerves: Cranial nerves are intact.     Sensory: Sensation is intact.     Motor: Motor function is intact.     Coordination: Coordination is intact.     Gait: Gait is intact.     Deep Tendon Reflexes: Reflexes are normal and symmetric.  Psychiatric:        Attention and Perception: Attention and perception normal.        Mood and Affect: Affect normal. Mood is depressed.        Speech: Speech normal.        Behavior: Behavior normal. Behavior is cooperative.        Thought Content: Thought content normal.        Cognition and Memory: Cognition and memory normal.        Judgment:  Judgment normal.     Results for orders placed or performed in visit on 11/25/18  Microscopic Examination   URINE  Result Value Ref Range   WBC, UA 0-5 0 - 5 /hpf   RBC None seen 0 - 2 /hpf   Epithelial Cells (non renal) 0-10 0 - 10 /hpf   Renal Epithel, UA None seen None seen /hpf   Bacteria, UA None seen None seen/Few  Urinalysis, Complete  Result Value Ref Range   Specific Gravity, UA 1.020 1.005 - 1.030   pH, UA 6.5 5.0 - 7.5   Color, UA Yellow Yellow   Appearance Ur Clear Clear   Leukocytes,UA Negative Negative   Protein,UA Negative Negative/Trace   Glucose, UA Negative Negative   Ketones, UA Negative Negative   RBC, UA Negative Negative   Bilirubin, UA Negative Negative   Urobilinogen, Ur 0.2 0.2 - 1.0 mg/dL   Nitrite, UA Negative Negative   Microscopic Examination See below:      EKG: partial BBB, SR without ectopy or acute changes, no significant changes from previous EKG. Monia Pouch. FNP-C  Pertinent labs & imaging results that were available during my care of the patient were reviewed by me and considered in my medical decision making.  Assessment & Plan:  Tasha [name] was seen today for foot swelling, flank pain and pain.  Diagnoses and all orders for this visit:  Essential hypertension BP elevated in office today. Will add on low dose HCTZ. DASH diet and exercise discussed. Labs pending. EKG unremarkable in office. Follow up in 2 weeks for BP and CMP recheck.  -     CMP14+EGFR -     CBC with Differential/Platelet -     EKG 12-Lead -     hydrochlorothiazide (HYDRODIURIL) 12.5 MG tablet; Take 1 tablet (12.5 mg total) by mouth daily.  Acquired hypothyroidism Labs pending. Will change repletion therapy if warranted. Pt aware to take medications on an empty stomach every day.  -     Thyroid Panel With TSH  Other fatigue Labs pending. Sleep hygiene discussed. Fatigue could be due to ongoing untreated depression, medications initiated today. Unlikely due to CHF  due to lack of S3, lower extremity edema, chest pain, or palpitations.  -     Thyroid  Panel With TSH -     CMP14+EGFR -     CBC with Differential/Platelet  Exertional shortness of breath Symptoms likely due to deconditioning. Unlikely due to new onset CHF due to lack of rales, lower extremity edema throughout the day, S3, chest pain, palpitations, PND, or orthopnea.  Pt will get CXR in am at office due to xray not being available today.  -     CMP14+EGFR -     EKG 12-Lead -     DG Chest 2 View; Future -     hydrochlorothiazide (HYDRODIURIL) 12.5 MG tablet; Take 1 tablet (12.5 mg total) by mouth daily.  Left flank pain Urinalysis unremarkable in office. No CVA tenderness. No fever, chills, weakness, or confusion. Pt aware to report any new or worsening symptoms. Culture pending.  -     Urinalysis, Complete -     Urine Culture -     Microscopic Examination  Chronic pain of both shoulders Pain likely due to ongoing untreated depression. Unlikely due to tick born illness as the pain started months prior to the tick bite, will check ANA to rule out other possible underlying causes. Will initiate duloxetine for dual efficacy. Report any new or worsening symptoms.  -     ANA,IFA RA Diag Pnl w/rflx Tit/Patn -     DULoxetine (CYMBALTA) 20 MG capsule; Take 1 capsule (20 mg total) by mouth daily.  Tick bite of left lower leg, initial encounter Recent tick removal. Will check for tick born illnesses due to fatigue and arthralgias.  -     Rocky mtn spotted fvr abs pnl(IgG+IgM) -     Lyme Ab/Western Blot Reflex  Swelling of lower leg No edema present in office. Pt states is it mostly in the evenings. Likely dependent edema from standing all day and not watching salt intake. Will get chest xray in am. EKG unremarkable. Will initiate low dose HCTZ foe swelling and BP control. Report any new or worsening symptoms.  -     EKG 12-Lead -     DG Chest 2 View; Future -     hydrochlorothiazide  (HYDRODIURIL) 12.5 MG tablet; Take 1 tablet (12.5 mg total) by mouth daily.  Onychomycosis of toenail Bilateral feet affected. Will treat with topical efinaconazole.  -     Efinaconazole 10 % SOLN; Apply 1 application topically daily.  Depression, recurrent ([place]) Ongoing for several months. Chronic pain and fatigue may be due to ongoing depression. Will initiate duloxetine for dual efficacy - pain relief and antidepressive. Pt aware to report any new or worsening symptoms. Reevaluation in 2 weeks.  -     DULoxetine (CYMBALTA) 20 MG capsule; Take 1 capsule (20 mg total) by mouth daily.     Continue all other maintenance medications.   Total time spent with patient 65 minutes.  Greater than 50% of encounter spent in coordination of care/counseling.  Follow up plan: Return in about 2 weeks (around 12/09/2018), or if symptoms worsen or fail to improve, for with Dettinger for BP, depression.  Continue healthy lifestyle choices, including diet (rich in fruits, vegetables, and lean proteins, and low in salt and simple carbohydrates) and exercise (at least 30 minutes of moderate physical activity daily).  Educational handout given for survey, COVID-19  The above assessment and management plan was discussed with the patient. The patient verbalized understanding of and has agreed to the management plan. Patient is aware to call the clinic if symptoms persist or worsen. Patient is aware when  to return to the clinic for a follow-up visit. Patient educated on when it is appropriate to go to the emergency department.   Monia Pouch, FNP-C [facility] [phone] 11/25/18

## 2018-11-25 NOTE — Patient Instructions (Signed)
It was a pleasure seeing you today, Tasha [name].  Information regarding what we discussed is included in this packet.  Please see PCP in 2 weeks.  In a few days you may receive a survey in the mail or online from [employer] regarding your visit with [place] today. Please take a moment to fill this out. Your feedback is very important to our office. It can help [place] better understand your needs as well as improve your experience and satisfaction. Thank you for taking your time to complete it. We care about you.  Because of recent events of COVID-19 ("Coronavirus"), please follow CDC recommendations:   1. Wash your hand frequently 2. Avoid touching your face 3. Stay away from people who are sick 4. If you have symptoms such as fever, cough, shortness of breath then call your healthcare provider for further guidance 5. If you are sick, STAY AT HOME, unless otherwise directed by your healthcare provider. 6. Follow directions from state and national officials regarding staying safe    Please feel free to call our office if any questions or concerns arise.  Warm Regards, Monia Pouch, FNP-C [facility] [address] [place], [place] [address] [phone]

## 2018-11-26 ENCOUNTER — Ambulatory Visit (INDEPENDENT_AMBULATORY_CARE_PROVIDER_SITE_OTHER): Payer: Medicare Other

## 2018-11-26 ENCOUNTER — Other Ambulatory Visit: Payer: Medicare Other

## 2018-11-26 DIAGNOSIS — R0602 Shortness of breath: Secondary | ICD-10-CM

## 2018-11-26 DIAGNOSIS — M7989 Other specified soft tissue disorders: Secondary | ICD-10-CM | POA: Diagnosis not present

## 2018-11-26 LAB — URINE CULTURE

## 2018-11-27 LAB — CBC WITH DIFFERENTIAL/PLATELET
Basophils Absolute: 0 10*3/uL (ref 0.0–0.2)
Basos: 1 %
EOS (ABSOLUTE): 0.2 10*3/uL (ref 0.0–0.4)
Eos: 2 %
Hematocrit: 40.8 % (ref 34.0–46.6)
Hemoglobin: 13.7 g/dL (ref 11.1–15.9)
Immature Grans (Abs): 0 10*3/uL (ref 0.0–0.1)
Immature Granulocytes: 0 %
Lymphocytes Absolute: 1.8 10*3/uL (ref 0.7–3.1)
Lymphs: 24 %
MCH: 30 pg (ref 26.6–33.0)
MCHC: 33.6 g/dL (ref 31.5–35.7)
MCV: 90 fL (ref 79–97)
Monocytes Absolute: 0.9 10*3/uL (ref 0.1–0.9)
Monocytes: 12 %
Neutrophils Absolute: 4.6 10*3/uL (ref 1.4–7.0)
Neutrophils: 61 %
Platelets: 114 10*3/uL — ABNORMAL LOW (ref 150–450)
RBC: 4.56 x10E6/uL (ref 3.77–5.28)
RDW: 13.2 % (ref 11.7–15.4)
WBC: 7.4 10*3/uL (ref 3.4–10.8)

## 2018-11-27 LAB — CMP14+EGFR
ALT: 30 IU/L (ref 0–32)
AST: 32 IU/L (ref 0–40)
Albumin/Globulin Ratio: 2.3 — ABNORMAL HIGH (ref 1.2–2.2)
Albumin: 4.8 g/dL (ref 3.8–4.8)
Alkaline Phosphatase: 90 IU/L (ref 39–117)
BUN/Creatinine Ratio: 21 (ref 12–28)
BUN: 15 mg/dL (ref 8–27)
Bilirubin Total: 0.5 mg/dL (ref 0.0–1.2)
CO2: 20 mmol/L (ref 20–29)
Calcium: 9.7 mg/dL (ref 8.7–10.3)
Chloride: 103 mmol/L (ref 96–106)
Creatinine, Ser: 0.7 mg/dL (ref 0.57–1.00)
GFR calc Af Amer: 103 mL/min/{1.73_m2} (ref >59)
GFR calc non Af Amer: 89 mL/min/{1.73_m2} (ref >59)
Globulin, Total: 2.1 g/dL (ref 1.5–4.5)
Glucose: 93 mg/dL (ref 65–99)
Potassium: 3.8 mmol/L (ref 3.5–5.2)
Sodium: 142 mmol/L (ref 134–144)
Total Protein: 6.9 g/dL (ref 6.0–8.5)

## 2018-11-27 LAB — FANA STAINING PATTERNS
Homogeneous Pattern: 1:160 {titer} — ABNORMAL HIGH
Speckled Pattern: 1:320 {titer} — ABNORMAL HIGH

## 2018-11-27 LAB — THYROID PANEL WITH TSH
Free Thyroxine Index: 2.9 (ref 1.2–4.9)
T3 Uptake Ratio: 28 % (ref 24–39)
T4, Total: 10.5 ug/dL (ref 4.5–12.0)
TSH: 1.61 u[IU]/mL (ref 0.450–4.500)

## 2018-11-27 LAB — RMSF, IGG, IFA: RMSF, IGG, IFA: 1:64 {titer} — ABNORMAL HIGH

## 2018-11-27 LAB — LYME AB/WESTERN BLOT REFLEX
LYME DISEASE AB, QUANT, IGM: <0.8 index (ref 0.00–0.79)
Lyme IgG/IgM Ab: <0.91 {ISR} (ref 0.00–0.90)

## 2018-11-27 LAB — ROCKY MTN SPOTTED FVR ABS PNL(IGG+IGM)
RMSF IgG: POSITIVE — AB
RMSF IgM: 1.26 index — ABNORMAL HIGH (ref 0.00–0.89)

## 2018-11-27 LAB — ANA,IFA RA DIAG PNL W/RFLX TIT/PATN
ANA Titer 1: POSITIVE — AB
Cyclic Citrullin Peptide Ab: 3 units (ref 0–19)
Rheumatoid fact SerPl-aCnc: 10.9 IU/mL (ref 0.0–13.9)

## 2018-11-29 MED ORDER — DOXYCYCLINE HYCLATE 100 MG PO TABS: 100.0000 mg | ORAL_TABLET | Freq: Two times a day (BID) | ORAL | 0 refills | Status: AC

## 2018-11-29 NOTE — Addendum Note (Signed)
Addended by: Baruch Gouty on: 11/29/2018 06:21 PM   Modules accepted: Orders

## 2018-12-03 ENCOUNTER — Telehealth: Payer: Self-pay | Admitting: Family Medicine

## 2018-12-03 NOTE — Telephone Encounter (Signed)
It is too soon to make a determination if the duloxetine is going to work or not, especially with the symptoms they will often go away, please switch to taking the duloxetine at nighttime and we will discuss at her visit in a week.

## 2018-12-03 NOTE — Telephone Encounter (Signed)
Attempted to contact patient - Call dropped. Called patient back and NA

## 2018-12-08 ENCOUNTER — Telehealth: Payer: Self-pay | Admitting: Family Medicine

## 2018-12-09 ENCOUNTER — Encounter: Payer: Self-pay | Admitting: Family Medicine

## 2018-12-09 ENCOUNTER — Other Ambulatory Visit: Payer: Self-pay

## 2018-12-09 ENCOUNTER — Ambulatory Visit (INDEPENDENT_AMBULATORY_CARE_PROVIDER_SITE_OTHER): Payer: Medicare Other | Admitting: Family Medicine

## 2018-12-09 VITALS — BP 141/75 | HR 91 | Temp 98.4°F | Ht 61.0 in | Wt 150.0 lb

## 2018-12-09 DIAGNOSIS — K219 Gastro-esophageal reflux disease without esophagitis: Secondary | ICD-10-CM | POA: Diagnosis not present

## 2018-12-09 DIAGNOSIS — J849 Interstitial pulmonary disease, unspecified: Secondary | ICD-10-CM

## 2018-12-09 DIAGNOSIS — I10 Essential (primary) hypertension: Secondary | ICD-10-CM

## 2018-12-09 DIAGNOSIS — D696 Thrombocytopenia, unspecified: Secondary | ICD-10-CM

## 2018-12-09 MED ORDER — PANTOPRAZOLE SODIUM 40 MG PO TBEC: 40.0000 mg | DELAYED_RELEASE_TABLET | Freq: Every day | ORAL | 3 refills | Status: DC

## 2018-12-09 MED ORDER — COMBIVENT RESPIMAT 20-100 MCG/ACT IN AERS: 1.0000 | INHALATION_SPRAY | Freq: Four times a day (QID) | RESPIRATORY_TRACT | 5 refills | Status: DC | PRN

## 2018-12-09 MED ORDER — HYDROCHLOROTHIAZIDE 25 MG PO TABS: 25.0000 mg | ORAL_TABLET | Freq: Every day | ORAL | 3 refills | Status: DC

## 2018-12-09 NOTE — Progress Notes (Signed)
Subjective:  [name], female    DOB: Dec 18, 1950, 68 y.o.   MRN: [id number]  Patient Care Team: Dettinger, Fransisca Kaufmann, MD as PCP - General (Family Medicine) Herminio Commons, MD as PCP - Cardiology (Cardiology)   Chief Complaint:  Medical Management of Chronic Issues (2 week ) and Hypertension  Stratus video interpreter used for entire translation of this visit   HPI: Tasha [name] is a 68 y.o. female presenting on 12/09/2018 for Medical Management of Chronic Issues (2 week ) and Hypertension   1. Interstitial lung disease ([place])  Doing well on current medications. Does need a refill on Combivent. Denies breakthrough symptoms.    2. Essential hypertension  Complaint with meds - Yes Current Medications - amlodipine 5 mg, HCTZ 12.5 mg, losartan 50 mg Checking BP at home ranging 150/90 Exercising Regularly - No Watching Salt intake - No Pertinent ROS:  Headache - No Fatigue - No Visual Disturbances - No Chest pain - No Dyspnea - No Palpitations - No LE edema - No They report good compliance with medications and can restate their regimen by memory. No medication side effects.  Family, social, and smoking history reviewed.   BP Readings from Last 3 Encounters:  12/09/18 (!) 141/75  11/25/18 (!) 171/91  06/15/18 (!) 148/90   CMP Latest Ref Rng & Units 11/25/2018 03/18/2018 01/21/2018  Glucose 65 - 99 mg/dL 93 83 119(H)  BUN 8 - 27 mg/dL 15 11 18   Creatinine 0.57 - 1.00 mg/dL 0.70 0.68 0.87  Sodium 134 - 144 mmol/L 142 145(H) 142  Potassium 3.5 - 5.2 mmol/L 3.8 3.9 4.7  Chloride 96 - 106 mmol/L 103 106 104  CO2 20 - 29 mmol/L 20 24 23   Calcium 8.7 - 10.3 mg/dL 9.7 9.5 9.2  Total Protein 6.0 - 8.5 g/dL 6.9 6.5 6.0  Total Bilirubin 0.0 - 1.2 mg/dL 0.5 0.3 0.4  Alkaline Phos 39 - 117 IU/L 90 55 44  AST 0 - 40 IU/L 32 33 17  ALT 0 - 32 IU/L 30 38(H) 26      3. Gastroesophageal reflux disease without esophagitis  Pt complains of recurrent heart  burn. States she has burning in her throat, feels as if throat is irritated at times. States she has an intermittent cough.  Cough - Yes Sore throat - Yes Voice change - No Hemoptysis - No Dysphagia or dyspepsia - No Water brash - Yes Red Flags (weight loss, hematochezia, melena, weight loss, early satiety, fevers, odynophagia, or persistent vomiting) - No Pt has not been on PPI or H2 antagonist. States she will use tums as needed with some relief of symptoms.    4. Thrombocytopenia (HCC)  No abnormal bleeding or bruising.    5.      Positive ANA          Pt has not followed up with rheumatology. Referral has been placed. Pt aware to           let office know if she has not received a call within the next week. Pt is not taking           the duloxetine, states it caused her to feel light headed. States she still has pain            in multiple joints.   Relevant past medical, surgical, family, and social history reviewed and updated as indicated.  Allergies and medications reviewed and updated. Date reviewed: Chart in Epic.  Past Medical History:  Diagnosis Date  . Acute respiratory failure ([place])   . CAP (community acquired pneumonia)   . Hyperlipidemia   . Hypertension   . Metabolic syndrome     Past Surgical History:  Procedure Laterality Date  . CESAREAN SECTION      Social History   Socioeconomic History  . Marital status: Married    Spouse name: Not on file  . Number of children: Not on file  . Years of education: Not on file  . Highest education level: Not on file  Occupational History  . Not on file  Social Needs  . Financial resource strain: Not hard at all  . Food insecurity    Worry: Never true    Inability: Never true  . Transportation needs    Medical: No    Non-medical: No  Tobacco Use  . Smoking status: Never Smoker  . Smokeless tobacco: Never Used  Substance and Sexual Activity  . Alcohol use: No  . Drug use: No  . Sexual activity: Not  Currently    Birth control/protection: Post-menopausal  Lifestyle  . Physical activity    Days per week: 7 days    Minutes per session: 30 min  . Stress: Only a little  Relationships  . Social Herbalist on phone: Never    Gets together: Never    Attends religious service: More than 4 times per year    Active member of club or organization: Yes    Attends meetings of clubs or organizations: More than 4 times per year    Relationship status: Married  . Intimate partner violence    Fear of current or ex partner: No    Emotionally abused: No    Physically abused: No    Forced sexual activity: No  Other Topics Concern  . Not on file  Social History Narrative  . Not on file    Outpatient Encounter Medications as of 12/09/2018  Medication Sig  . amLODipine (NORVASC) 5 MG tablet Take 1 tablet (5 mg total) by mouth at bedtime.  Marland Kitchen aspirin EC 81 MG tablet Take 1 tablet (81 mg total) by mouth daily.  Marland Kitchen atorvastatin (LIPITOR) 20 MG tablet Take 1 tablet (20 mg total) by mouth daily at 6 PM.  . cetirizine (ZYRTEC) 10 MG tablet Take 1 tablet (10 mg total) by mouth daily as needed for allergies.  Marland Kitchen doxycycline (VIBRA-TABS) 100 MG tablet Take 1 tablet (100 mg total) by mouth 2 (two) times daily for 21 days. 1 po bid  . Efinaconazole 10 % SOLN Apply 1 application topically daily.  . Ipratropium-Albuterol (COMBIVENT RESPIMAT) 20-100 MCG/ACT AERS respimat Inhale 1 puff into the lungs every 6 (six) hours as needed for wheezing.  Marland Kitchen levothyroxine (SYNTHROID) 50 MCG tablet Take 1 tablet (50 mcg total) by mouth daily before breakfast.  . losartan (COZAAR) 50 MG tablet Take 1 tablet by mouth daily.  . Omega 3 1000 MG CAPS Take by mouth.  . [DISCONTINUED] DULoxetine (CYMBALTA) 20 MG capsule Take 1 capsule (20 mg total) by mouth daily.  . [DISCONTINUED] hydrochlorothiazide (HYDRODIURIL) 12.5 MG tablet Take 1 tablet (12.5 mg total) by mouth daily.  . [DISCONTINUED] Ipratropium-Albuterol  (COMBIVENT RESPIMAT) 20-100 MCG/ACT AERS respimat Inhale 1 puff into the lungs every 6 (six) hours as needed for wheezing.  . hydrochlorothiazide (HYDRODIURIL) 25 MG tablet Take 1 tablet (25 mg total) by mouth daily.  . pantoprazole (PROTONIX) 40 MG tablet Take 1 tablet (40  mg total) by mouth daily.   No facility-administered encounter medications on file as of 12/09/2018.     Allergies  Allergen Reactions  . Homatropine     Pt doesn't remember  . Hydromet [Hydrocodone-Homatropine]     Pt doesn't remember  . Amoxicillin Rash    Review of Systems  Constitutional: Negative for activity change, appetite change, chills, diaphoresis, fatigue, fever and unexpected weight change.  HENT: Positive for sore throat. Negative for tinnitus, trouble swallowing and voice change.   Eyes: Negative.  Negative for photophobia and visual disturbance.  Respiratory: Positive for cough. Negative for apnea, choking, chest tightness, shortness of breath, wheezing and stridor.   Cardiovascular: Negative for chest pain, palpitations and leg swelling.  Gastrointestinal: Negative for abdominal pain, blood in stool, constipation, diarrhea, nausea and vomiting.  Endocrine: Negative.  Negative for cold intolerance, heat intolerance, polydipsia, polyphagia and polyuria.  Genitourinary: Negative for decreased urine volume, difficulty urinating, dysuria, frequency and urgency.  Musculoskeletal: Positive for arthralgias, back pain and myalgias. Negative for gait problem, joint swelling, neck pain and neck stiffness.  Skin: Negative.  Negative for color change, pallor, rash and wound.  Allergic/Immunologic: Negative.   Neurological: Negative for dizziness, tremors, seizures, syncope, facial asymmetry, speech difficulty, weakness, light-headedness, numbness and headaches.  Hematological: Negative.  Negative for adenopathy. Does not bruise/bleed easily.  Psychiatric/Behavioral: Negative for confusion, hallucinations, sleep  disturbance and suicidal ideas.  All other systems reviewed and are negative.       Objective:  BP (!) 141/75   Pulse 91   Temp 98.4 F (36.9 C)   Ht 5' 1"  (1.549 m)   Wt 150 lb (68 kg)   BMI 28.34 kg/m    Wt Readings from Last 3 Encounters:  12/09/18 150 lb (68 kg)  11/25/18 151 lb (68.5 kg)  06/15/18 148 lb (67.1 kg)    Physical Exam Vitals signs and nursing note reviewed.  Constitutional:      General: She is not in acute distress.    Appearance: Normal appearance. She is obese. She is not ill-appearing, toxic-appearing or diaphoretic.  HENT:     Head: Normocephalic and atraumatic.     Right Ear: Tympanic membrane, ear canal and external ear normal.     Left Ear: Tympanic membrane, ear canal and external ear normal.     Nose: Nose normal.     Mouth/Throat:     Mouth: Mucous membranes are moist.     Pharynx: Oropharynx is clear. No oropharyngeal exudate or posterior oropharyngeal erythema.  Eyes:     Extraocular Movements: Extraocular movements intact.     Conjunctiva/sclera: Conjunctivae normal.     Pupils: Pupils are equal, round, and reactive to light.  Neck:     Musculoskeletal: Normal range of motion and neck supple. No neck rigidity or muscular tenderness.     Vascular: No carotid bruit.  Cardiovascular:     Rate and Rhythm: Normal rate and regular rhythm.     Pulses: Normal pulses.     Heart sounds: Normal heart sounds. No murmur. No friction rub. No gallop.   Pulmonary:     Effort: Pulmonary effort is normal. No respiratory distress.     Breath sounds: Normal breath sounds.  Abdominal:     General: Abdomen is flat. There is no distension.     Palpations: Abdomen is soft. There is no mass.     Tenderness: There is no abdominal tenderness. There is no right CVA tenderness, left CVA tenderness, guarding or rebound.  Hernia: No hernia is present.  Musculoskeletal: Normal range of motion.        General: No swelling or deformity.  Lymphadenopathy:      Cervical: No cervical adenopathy.  Skin:    General: Skin is warm and dry.     Capillary Refill: Capillary refill takes less than 2 seconds.  Neurological:     General: No focal deficit present.     Mental Status: She is alert and oriented to person, place, and time.     Cranial Nerves: No cranial nerve deficit.     Sensory: No sensory deficit.     Motor: No weakness.     Coordination: Coordination normal.     Gait: Gait normal.     Deep Tendon Reflexes: Reflexes normal.  Psychiatric:        Mood and Affect: Mood normal.        Behavior: Behavior normal.        Thought Content: Thought content normal.        Judgment: Judgment normal.     Results for orders placed or performed in visit on 11/25/18  Urine Culture   Specimen: Urine   UR  Result Value Ref Range   Urine Culture, Routine Final report    Organism ID, Bacteria Comment   Microscopic Examination   URINE  Result Value Ref Range   WBC, UA 0-5 0 - 5 /hpf   RBC None seen 0 - 2 /hpf   Epithelial Cells (non renal) 0-10 0 - 10 /hpf   Renal Epithel, UA None seen None seen /hpf   Bacteria, UA None seen None seen/Few  Urinalysis, Complete  Result Value Ref Range   Specific Gravity, UA 1.020 1.005 - 1.030   pH, UA 6.5 5.0 - 7.5   Color, UA Yellow Yellow   Appearance Ur Clear Clear   Leukocytes,UA Negative Negative   Protein,UA Negative Negative/Trace   Glucose, UA Negative Negative   Ketones, UA Negative Negative   RBC, UA Negative Negative   Bilirubin, UA Negative Negative   Urobilinogen, Ur 0.2 0.2 - 1.0 mg/dL   Nitrite, UA Negative Negative   Microscopic Examination See below:   Thyroid Panel With TSH  Result Value Ref Range   TSH 1.610 0.450 - 4.500 uIU/mL   T4, Total 10.5 4.5 - 12.0 ug/dL   T3 Uptake Ratio 28 24 - 39 %   Free Thyroxine Index 2.9 1.2 - 4.9  Rocky mtn spotted fvr abs pnl(IgG+IgM)  Result Value Ref Range   RMSF IgG Positive (A) Negative   RMSF IgM 1.26 (H) 0.00 - 0.89 index  Lyme Ab/Western  Blot Reflex  Result Value Ref Range   Lyme IgG/IgM Ab <0.91 0.00 - 0.90 ISR   LYME DISEASE AB, QUANT, IGM <0.80 0.00 - 0.79 index  CMP14+EGFR  Result Value Ref Range   Glucose 93 65 - 99 mg/dL   BUN 15 8 - 27 mg/dL   Creatinine, Ser 0.70 0.57 - 1.00 mg/dL   GFR calc non Af Amer 89 >59 mL/min/1.73   GFR calc Af Amer 103 >59 mL/min/1.73   BUN/Creatinine Ratio 21 12 - 28   Sodium 142 134 - 144 mmol/L   Potassium 3.8 3.5 - 5.2 mmol/L   Chloride 103 96 - 106 mmol/L   CO2 20 20 - 29 mmol/L   Calcium 9.7 8.7 - 10.3 mg/dL   Total Protein 6.9 6.0 - 8.5 g/dL   Albumin 4.8 3.8 - 4.8 g/dL  Globulin, Total 2.1 1.5 - 4.5 g/dL   Albumin/Globulin Ratio 2.3 (H) 1.2 - 2.2   Bilirubin Total 0.5 0.0 - 1.2 mg/dL   Alkaline Phosphatase 90 39 - 117 IU/L   AST 32 0 - 40 IU/L   ALT 30 0 - 32 IU/L  CBC with Differential/Platelet  Result Value Ref Range   WBC 7.4 3.4 - 10.8 x10E3/uL   RBC 4.56 3.77 - 5.28 x10E6/uL   Hemoglobin 13.7 11.1 - 15.9 g/dL   Hematocrit 40.8 34.0 - 46.6 %   MCV 90 79 - 97 fL   MCH 30.0 26.6 - 33.0 pg   MCHC 33.6 31.5 - 35.7 g/dL   RDW 13.2 11.7 - 15.4 %   Platelets 114 (L) 150 - 450 x10E3/uL   Neutrophils 61 Not Estab. %   Lymphs 24 Not Estab. %   Monocytes 12 Not Estab. %   Eos 2 Not Estab. %   Basos 1 Not Estab. %   Neutrophils Absolute 4.6 1.4 - 7.0 x10E3/uL   Lymphocytes Absolute 1.8 0.7 - 3.1 x10E3/uL   Monocytes Absolute 0.9 0.1 - 0.9 x10E3/uL   EOS (ABSOLUTE) 0.2 0.0 - 0.4 x10E3/uL   Basophils Absolute 0.0 0.0 - 0.2 x10E3/uL   Immature Granulocytes 0 Not Estab. %   Immature Grans (Abs) 0.0 0.0 - 0.1 x10E3/uL   Hematology Comments: Note:   ANA,IFA RA Diag Pnl w/rflx Tit/Patn  Result Value Ref Range   ANA Titer 1 Positive (A)    Rhuematoid fact SerPl-aCnc 10.9 0.0 - 81.4 IU/mL   Cyclic Citrullin Peptide Ab 3 0 - 19 units  FANA Staining Patterns  Result Value Ref Range   Homogeneous Pattern 1:160 (H)    Speckled Pattern 1:320 (H)    Note: Comment   RMSF,  IgG, IFA  Result Value Ref Range   RMSF, IGG, IFA 1:64 (H) Neg <1:64       Pertinent labs & imaging results that were available during my care of the patient were reviewed by me and considered in my medical decision making.  Assessment & Plan:  Tasha [name] was seen today for medical management of chronic issues and hypertension.  Diagnoses and all orders for this visit:  Interstitial lung disease ([place]) Well controlled. Continue below.  -     Ipratropium-Albuterol (COMBIVENT RESPIMAT) 20-100 MCG/ACT AERS respimat; Inhale 1 puff into the lungs every 6 (six) hours as needed for wheezing.  Essential hypertension BP poorly controlled. Changes were made in regimen today, increased HCTZ to 25 mg daily. Daily blood pressure log given with instructions on how to fill out and told to bring to next visit. Gaol BP 130/80. Pt aware to report any persistent high or low readings. DASH diet and exercise encouraged. Exercise at least 150 minutes per week and increase as tolerated. Goal BMI > 25. Stress management encouraged. Smoking cessation discussed. Avoid excessive alcohol. Avoid NSAID's. Avoid more than 2000 mg of sodium daily. Medications as prescribed. Follow up as scheduled.  -     hydrochlorothiazide (HYDRODIURIL) 25 MG tablet; Take 1 tablet (25 mg total) by mouth daily. -     CMP14+EGFR  Gastroesophageal reflux disease without esophagitis Diet discussed. Avoid fried, spicy, fatty, and acidic foods. Avoid caffeine, nicotine, and alcohol. Do not eat 2-3 hours before bedtime and stay upright for at least 1-2 hours after eating. Eat small frequent meals. Avoid NSAID's like motrin and aleve. Medications as prescribed. Report any new or worsening symptoms. Follow up as discussed or sooner  if needed.   -     CBC with Differential/Platelet -     pantoprazole (PROTONIX) 40 MG tablet; Take 1 tablet (40 mg total) by mouth daily.  Thrombocytopenia (HCC) No abnormal bleeding or bruising. Last platelet count 114,  will recheck today.  -     CBC with Differential/Platelet     Continue all other maintenance medications.  Follow up plan: Return in about 3 months (around 03/11/2019), or if symptoms worsen or fail to improve, for PCP for HTN.  Continue healthy lifestyle choices, including diet (rich in fruits, vegetables, and lean proteins, and low in salt and simple carbohydrates) and exercise (at least 30 minutes of moderate physical activity daily).  Educational handout given for DASH diet  The above assessment and management plan was discussed with the patient. The patient verbalized understanding of and has agreed to the management plan. Patient is aware to call the clinic if symptoms persist or worsen. Patient is aware when to return to the clinic for a follow-up visit. Patient educated on when it is appropriate to go to the emergency department.   Monia Pouch, FNP-C [facility] [phone] 12/09/18

## 2018-12-09 NOTE — Patient Instructions (Signed)
Plan de alimentacin DASH DASH Eating Plan DASH es la sigla en ingls de "Enfoques Alimentarios para Detener la Hipertensin" (Dietary Approaches to Stop Hypertension). El plan de alimentacin DASH ha demostrado bajar la presin arterial elevada (hipertensin). Tambin puede reducir el riesgo de diabetes tipo 2, enfermedad cardaca y accidente cerebrovascular. Este plan tambin puede ayudar a adelgazar. Consejos para seguir este plan  Pautas generales  Evite ingerir ms de 2,300 mg (miligramos) de sal (sodio) por da. Si tiene hipertensin, es posible que necesite reducir la ingesta de sodio a 1,500 mg por da.  Limite el consumo de alcohol a no ms de 1medida por da si es mujer y no est embarazada, y 2medidas por da si es hombre. Una medida equivale a 12oz (355ml) de cerveza, 5oz (148ml) de vino o 1oz (44ml) de bebidas alcohlicas de alta graduacin.  Trabaje con su mdico para mantener un peso saludable o perder peso. Pregntele cul es el peso recomendado para usted.  Realice al menos 30 minutos de ejercicio que haga que se acelere su corazn (ejercicio aerbico) la mayora de los das de la semana. Estas actividades pueden incluir caminar, nadar o andar en bicicleta.  Trabaje con su mdico o especialista en alimentacin y nutricin (nutricionista) para ajustar su plan alimentario a sus necesidades calricas personales. Lectura de las etiquetas de los alimentos   Verifique en las etiquetas de los alimentos, la cantidad de sodio por porcin. Elija alimentos con menos del 5 por ciento del valor diario de sodio. Generalmente, los alimentos con menos de 300 mg de sodio por porcin se encuadran dentro de este plan alimentario.  Para encontrar cereales integrales, busque la palabra "integral" como primera palabra en la lista de ingredientes. De compras  Compre productos en los que en su etiqueta diga: "bajo contenido de sodio" o "sin agregado de sal".  Compre alimentos frescos.  Evite los alimentos enlatados y comidas precocidas o congeladas. Coccin  Evite agregar sal cuando cocine. Use hierbas o aderezos sin sal, en lugar de sal de mesa o sal marina. Consulte al mdico o farmacutico antes de usar sustitutos de la sal.  No fra los alimentos. A la hora de cocinar los alimentos opte por hornearlos, hervirlos, grillarlos y asarlos a la parrilla.  Cocine con aceites cardiosaludables, como oliva, canola, soja o girasol. Planificacin de las comidas  Consuma una dieta equilibrada, que incluya lo siguiente: ? 5o ms porciones de frutas y verduras por da. Trate de que la mitad del plato de cada comida sean frutas y verduras. ? Hasta 6 u 8 porciones de cereales integrales por da. ? Menos de 6 onzas de carne, aves o pescado magros por da. Una porcin de 3 onzas de carne tiene casi el mismo tamao que un mazo de cartas. Un huevo equivale a 1 onza. ? Dos porciones de productos lcteos descremados por da. ? Una porcin de frutos secos, semillas o frijoles 5 veces por semana. ? Grasas cardiosaludables. Las grasas saludables llamadas cidos grasos omega-3 se encuentran en alimentos como semillas de lino y pescados de agua fra, como por ejemplo, sardinas, salmn y caballa.  Limite la cantidad que ingiere de los siguientes alimentos: ? Alimentos enlatados o envasados. ? Alimentos con alto contenido de grasa trans, como alimentos fritos. ? Alimentos con alto contenido de grasa saturada, como carne con grasa. ? Dulces, postres, bebidas azucaradas y otros alimentos con azcar agregada. ? Productos lcteos enteros.  No le agregue sal a los alimentos antes de probarlos.  Trate de comer   al menos 2 comidas vegetarianas por semana.  Consuma ms comida casera y menos de restaurante, de bufs y comida rpida.  Cuando coma en un restaurante, pida que preparen su comida con menos sal o, en lo posible, sin nada de sal. Qu alimentos se recomiendan? Los alimentos enumerados a  continuacin no constituyen una lista completa. Hable con el nutricionista sobre las mejores opciones alimenticias para usted. Cereales Pan de salvado o integral. Pasta de salvado o integral. Arroz integral. Avena. Quinua. Trigo burgol. Cereales integrales y con bajo contenido de sodio. Pan pita. Galletitas de agua con bajo contenido de grasa y sodio. Tortillas de harina integral. Verduras Verduras frescas o congeladas (crudas, al vapor, asadas o grilladas). Jugos de tomate y verduras con bajo contenido de sodio o reducidos en sodio. Salsa y pasta de tomate con bajo contenido de sodio o reducidas en sodio. Verduras enlatadas con bajo contenido de sodio o reducidas en sodio. Frutas Todas las frutas frescas, congeladas o disecadas. Frutas enlatadas en jugo natural (sin agregado de azcar). Carne y otros alimentos proteicos Pollo o pavo sin piel. Carne de pollo o de pavo molida. Cerdo desgrasado. Pescado y mariscos. Claras de huevo. Porotos, guisantes o lentejas secos. Frutos secos, mantequilla de frutos secos y semillas sin sal. Frijoles enlatados sin sal. Cortes de carne vacuna magra, desgrasada. Embutidos magros, con bajo contenido de sodio. Lcteos Leche descremada (1%) o descremada. Quesos sin grasa, con bajo contenido de grasa o descremados. Queso blanco o ricota sin grasa, con bajo contenido de sodio. Yogur semidescremado o descremado. Queso con bajo contenido de grasa y sodio. Grasas y aceites Margarinas untables que no contengan grasas trans. Aceite vegetal. [place] y aderezos para ensaladas livianos o con bajo contenido de grasas (reducidos en sodio). Aceite de canola, crtamo, oliva, soja y girasol. [place]. Condimentos y otros alimentos Hierbas. Especias. Mezclas de condimentos sin sal. Palomitas de maz y pretzels sin sal. Dulces con bajo contenido de grasas. Qu alimentos no se recomiendan? Los alimentos enumerados a continuacin no constituyen una lista completa. Hable con el  nutricionista sobre las mejores opciones alimenticias para usted. Cereales Productos de panificacin hechos con grasa, como medialunas, magdalenas y algunos panes. Comidas con arroz o pasta seca listas para usar. Verduras Verduras con crema o fritas. Verduras en salsa de queso. Verduras enlatadas regulares (que no sean con bajo contenido de sodio o reducidas en sodio). Pasta y salsa de tomates enlatadas regulares (que no sean con bajo contenido de sodio o reducidas en sodio). Jugos de tomate y verduras regulares (que no sean con bajo contenido de sodio o reducidos en sodio). Pepinillos. Aceitunas. Frutas Fruta enlatada en almbar liviano o espeso. Frutas cocidas en aceite. Frutas con salsa de crema o manteca. Carne y otros alimentos proteicos Cortes de carne con grasa. Costillas. Carne frita. Tocino. Salchichas. Mortadela y otras carnes procesadas. Salame. Panceta. Perros calientes (hotdogs). Salchicha de cerdo. Frutos secos y semillas con sal. Frijoles enlatados con agregado de sal. Pescado enlatado o ahumado. Huevos enteros o yemas. Pollo o pavo con piel. Lcteos Leche entera o al 2%, crema y mitad leche y mitad crema. Queso crema entero o con toda su grasa. Yogur entero o endulzado. Quesos con toda su grasa. Sustitutos de cremas no lcteas. Coberturas batidas. Quesos para untar y quesos procesados. Grasas y aceites Mantequilla. Margarina en barra. Manteca de cerdo. Materia grasa. Mantequilla clarificada. Grasa de panceta. Aceites tropicales como aceite de coco, palmiste o palma. Condimentos y otros alimentos Palomitas de maz y pretzels con sal. Sal   de cebolla, sal de ajo, sal condimentada, sal de mesa y sal marina. Salsa [place]. Salsa trtara. Salsa barbacoa. Salsa teriyaki. Salsa de soja, incluso la que tiene contenido reducido de [place]. Salsa de carne. Salsas en lata y envasadas. Salsa de pescado. Salsa de [place]. Salsa rosada. Rbano picante envasado. [place]. Mostaza. Saborizantes y  tiernizantes para carne. Caldo en cubitos. Salsa picante y salsa tabasco. Escabeches envasados o ya preparados. Aderezos para tacos prefabricados o envasados. Salsas. Aderezos comunes para ensalada. Dnde encontrar ms informacin:  [place], los Pulmones y Herbalist (National Heart, Lung, and [place]): https://wilson-eaton.com/  Asoci[employer] [place]ense [employer] [place] (American Heart Association): www.heart.org Resumen  El plan de alimentacin DASH ha demostrado bajar la presin arterial elevada (hipertensin). Tambin puede reducir [employer] de diabetes tipo 2, enfermedad cardaca y accidente cerebrovascular.  Con el plan de alimentacin DASH, deber limitar el consumo de sal (sodio) a 2,300 mg por da. Si tiene hipertensin, es posible que necesite reducir la ingesta de sodio a 1,500 mg por da.  Cuando siga el plan de alimentacin DASH, trate de comer ms frutas frescas y verduras, cereales integrales, carnes magras, lcteos descremados y grasas cardiosaludables.  Trabaje con su mdico o especialista en alimentacin y nutricin (nutricionista) para ajustar su plan alimentario a sus necesidades calricas personales. Esta informacin no tiene Marine scientist el consejo del mdico. Asegrese de hacerle al mdico cualquier pregunta que tenga. Document Released: 03/28/2011 Document Revised: 07/29/2016 Document Reviewed: 07/29/2016 Elsevier Patient Education  2020 [employer].

## 2018-12-10 ENCOUNTER — Ambulatory Visit (INDEPENDENT_AMBULATORY_CARE_PROVIDER_SITE_OTHER): Payer: Medicare Other | Admitting: *Deleted

## 2018-12-10 ENCOUNTER — Ambulatory Visit (HOSPITAL_COMMUNITY)
Admission: RE | Admit: 2018-12-10 | Discharge: 2018-12-10 | Disposition: A | Discharge status: Home / Self Care | Payer: Medicare Other | Source: Ambulatory Visit | Attending: Internal Medicine | Admitting: Internal Medicine

## 2018-12-10 DIAGNOSIS — J849 Interstitial pulmonary disease, unspecified: Secondary | ICD-10-CM | POA: Insufficient documentation

## 2018-12-10 DIAGNOSIS — Z0000 Encounter for general adult medical examination without abnormal findings: Secondary | ICD-10-CM | POA: Diagnosis not present

## 2018-12-10 DIAGNOSIS — R0602 Shortness of breath: Secondary | ICD-10-CM | POA: Diagnosis not present

## 2018-12-10 LAB — CMP14+EGFR
ALT: 26 IU/L (ref 0–32)
AST: 28 IU/L (ref 0–40)
Albumin/Globulin Ratio: 2.1 (ref 1.2–2.2)
Albumin: 4.5 g/dL (ref 3.8–4.8)
Alkaline Phosphatase: 89 IU/L (ref 39–117)
BUN/Creatinine Ratio: 24 (ref 12–28)
BUN: 20 mg/dL (ref 8–27)
Bilirubin Total: 0.3 mg/dL (ref 0.0–1.2)
CO2: 23 mmol/L (ref 20–29)
Calcium: 9.6 mg/dL (ref 8.7–10.3)
Chloride: 105 mmol/L (ref 96–106)
Creatinine, Ser: 0.84 mg/dL (ref 0.57–1.00)
GFR calc Af Amer: 83 mL/min/{1.73_m2} (ref >59)
GFR calc non Af Amer: 72 mL/min/{1.73_m2} (ref >59)
Globulin, Total: 2.1 g/dL (ref 1.5–4.5)
Glucose: 107 mg/dL — ABNORMAL HIGH (ref 65–99)
Potassium: 3.5 mmol/L (ref 3.5–5.2)
Sodium: 144 mmol/L (ref 134–144)
Total Protein: 6.6 g/dL (ref 6.0–8.5)

## 2018-12-10 LAB — CBC WITH DIFFERENTIAL/PLATELET
Basophils Absolute: 0 10*3/uL (ref 0.0–0.2)
Basos: 0 %
EOS (ABSOLUTE): 0.2 10*3/uL (ref 0.0–0.4)
Eos: 2 %
Hematocrit: 38.6 % (ref 34.0–46.6)
Hemoglobin: 13 g/dL (ref 11.1–15.9)
Immature Grans (Abs): 0 10*3/uL (ref 0.0–0.1)
Immature Granulocytes: 0 %
Lymphocytes Absolute: 1.9 10*3/uL (ref 0.7–3.1)
Lymphs: 21 %
MCH: 29.5 pg (ref 26.6–33.0)
MCHC: 33.7 g/dL (ref 31.5–35.7)
MCV: 88 fL (ref 79–97)
Monocytes Absolute: 0.9 10*3/uL (ref 0.1–0.9)
Monocytes: 10 %
Neutrophils Absolute: 6 10*3/uL (ref 1.4–7.0)
Neutrophils: 67 %
Platelets: 128 10*3/uL — ABNORMAL LOW (ref 150–450)
RBC: 4.4 x10E6/uL (ref 3.77–5.28)
RDW: 13.4 % (ref 11.7–15.4)
WBC: 9.1 10*3/uL (ref 3.4–10.8)

## 2018-12-10 NOTE — Progress Notes (Signed)
MEDICARE ANNUAL WELLNESS VISIT  12/10/2018  Telephone Visit Disclaimer This Medicare AWV was conducted by telephone due to national recommendations for restrictions regarding the COVID-19 Pandemic (e.g. social distancing).  I verified, using two identifiers, that I am speaking with Tasha [name] or their authorized healthcare agent. I discussed the limitations, risks, security, and privacy concerns of performing an evaluation and management service by telephone and the potential availability of an in-person appointment in the future. The patient expressed understanding and agreed to proceed.   Subjective:  Tasha [name] is a 68 y.o. female patient of Dettinger, Fransisca Kaufmann, MD who had a Medicare Annual Wellness Visit today via telephone.The interpreter via [employer] was on the phone to translate. Tasha [name] is Retired and lives with their spouse. she has 3 children. she reports that she is socially active and does interact with friends/family regularly. she is minimally physically active and enjoys gardening.  Patient Care Team: Dettinger, Fransisca Kaufmann, MD as PCP - General (Family Medicine) Herminio Commons, MD as PCP - Cardiology (Cardiology)  Advanced Directives 12/10/2018 12/16/2017  Does Patient Have a Medical Advance Directive? No No  Would patient like information on creating a medical advance directive? No - Patient declined No - Patient declined    Hospital Utilization Over the Past 12 Months: # of hospitalizations or ER visits: 0 # of surgeries: 0  Review of Systems    Patient reports that her overall health is better compared to last year.  Patient Reported Readings (BP, Pulse, CBG, Weight, etc) none  Review of Systems: History obtained from chart review  All other systems negative.  Pain Assessment Pain : 0-10 Pain Score: 6  Pain Type: Other (Comment)(it has gotten worse but has been going on for a while) Pain Location: Leg Pain Orientation:  Lower Pain Descriptors / Indicators: Aching, Nagging Pain Onset: More than a month ago Pain Frequency: Intermittent Effect of Pain on Daily Activities: mild     Current Medications & Allergies (verified) Allergies as of 12/10/2018      Reactions   Homatropine    Pt doesn't remember   Hydromet [hydrocodone-homatropine]    Pt doesn't remember   Amoxicillin Rash      Medication List       Accurate as of December 10, 2018  3:01 PM. If you have any questions, ask your nurse or doctor.        amLODipine 5 MG tablet Commonly known as: NORVASC Take 1 tablet (5 mg total) by mouth at bedtime.   aspirin EC 81 MG tablet Take 1 tablet (81 mg total) by mouth daily.   atorvastatin 20 MG tablet Commonly known as: LIPITOR Take 1 tablet (20 mg total) by mouth daily at 6 PM.   cetirizine 10 MG tablet Commonly known as: ZYRTEC Take 1 tablet (10 mg total) by mouth daily as needed for allergies.   Combivent Respimat 20-100 MCG/ACT Aers respimat Generic drug: Ipratropium-Albuterol Inhale 1 puff into the lungs every 6 (six) hours as needed for wheezing.   doxycycline 100 MG tablet Commonly known as: VIBRA-TABS Take 1 tablet (100 mg total) by mouth 2 (two) times daily for 21 days. 1 po bid   Efinaconazole 10 % Soln Apply 1 application topically daily.   hydrochlorothiazide 25 MG tablet Commonly known as: HYDRODIURIL Take 1 tablet (25 mg total) by mouth daily.   levothyroxine 50 MCG tablet Commonly known as: SYNTHROID Take 1 tablet (50 mcg total) by mouth daily before breakfast.  losartan 50 MG tablet Commonly known as: COZAAR Take 1 tablet by mouth daily.   Omega 3 1000 MG Caps Take by mouth.   pantoprazole 40 MG tablet Commonly known as: PROTONIX Take 1 tablet (40 mg total) by mouth daily.       History (reviewed): Past Medical History:  Diagnosis Date  . Acute respiratory failure ([place])   . CAP (community acquired pneumonia)   . Hyperlipidemia   . Hypertension    . Metabolic syndrome    Past Surgical History:  Procedure Laterality Date  . CESAREAN SECTION     Family History  Problem Relation Age of Onset  . Cancer Father   . Healthy Brother   . Healthy Daughter   . Healthy Brother    Social History   Socioeconomic History  . Marital status: Married    Spouse name: Ramiro  . Number of children: 3  . Years of education: 6  . Highest education level: 6th grade  Occupational History  . Occupation: retired  Scientific laboratory technician  . Financial resource strain: Not hard at all  . Food insecurity    Worry: Never true    Inability: Never true  . Transportation needs    Medical: No    Non-medical: No  Tobacco Use  . Smoking status: Never Smoker  . Smokeless tobacco: Never Used  Substance and Sexual Activity  . Alcohol use: No  . Drug use: No  . Sexual activity: Not Currently    Birth control/protection: Post-menopausal  Lifestyle  . Physical activity    Days per week: 7 days    Minutes per session: 30 min  . Stress: Not at all  Relationships  . Social Herbalist on phone: Twice a week    Gets together: Once a week    Attends religious service: More than 4 times per year    Active member of club or organization: Yes    Attends meetings of clubs or organizations: More than 4 times per year    Relationship status: Married  Other Topics Concern  . Not on file  Social History Narrative  . Not on file    Activities of Daily Living In your present state of health, do you have any difficulty performing the following activities: 12/10/2018 12/16/2017  Hearing? N N  Vision? N Y  Difficulty concentrating or making decisions? N N  Walking or climbing stairs? Y Y  Comment sometimes due to the pain in my knees -  Dressing or bathing? N N  Doing errands, shopping? N N  Preparing Food and eating ? N -  Using the Toilet? N -  In the past six months, have you accidently leaked urine? N -  Do you have problems with loss of bowel  control? N -  Managing your Medications? N -  Managing your Finances? N -  Housekeeping or managing your Housekeeping? N -  Some recent data might be hidden    Patient Education/ Literacy How often do you need to have someone help you when you read instructions, pamphlets, or other written materials from your doctor or pharmacy?: 1 - Never(she can read spanish) What is the last grade level you completed in school?: 6th grade  Exercise Current Exercise Habits: Home exercise routine, Type of exercise: walking, Time (Minutes): 30, Frequency (Times/Week): 7, Weekly Exercise (Minutes/Week): 210, Intensity: Mild, Exercise limited by: orthopedic condition(s)  Diet Patient reports consuming 2-3 meals a day and 1 snack(s) a day Patient  reports that her primary diet is: Regular Patient reports that she does have regular access to food.   Depression Screen PHQ 2/9 Scores 12/10/2018 12/09/2018 11/25/2018 11/25/2018 04/24/2018 02/18/2018 02/16/2018  PHQ - 2 Score 0 0 0 0 0 0 0  PHQ- 9 Score - - 5 - - - -     Fall Risk Fall Risk  12/10/2018 12/09/2018 11/25/2018 02/18/2018 02/16/2018  Falls in the past year? 0 0 0 Yes Yes  Comment - - - Oct 2019 -  Number falls in past yr: 0 - - 1 1  Injury with Fall? 0 - - - No  Risk for fall due to : - - - - History of fall(s)     Objective:  Tasha [name] seemed alert and oriented and she participated appropriately during our telephone visit.  Blood Pressure Weight BMI  BP Readings from Last 3 Encounters:  12/09/18 (!) 141/75  11/25/18 (!) 171/91  06/15/18 (!) 148/90   Wt Readings from Last 3 Encounters:  12/09/18 150 lb (68 kg)  11/25/18 151 lb (68.5 kg)  06/15/18 148 lb (67.1 kg)   BMI Readings from Last 1 Encounters:  12/09/18 28.34 kg/m    *Unable to obtain current vital signs, weight, and BMI due to telephone visit type  Hearing/Vision  . Tasha [name] did not seem to have difficulty with hearing/understanding during the telephone conversation .  Reports that she has not had a formal eye exam by an eye care professional within the past year . Reports that she has not had a formal hearing evaluation within the past year *Unable to fully assess hearing and vision during telephone visit type  Cognitive Function: 6CIT Screen 12/10/2018  What Year? 0 points  What month? 0 points  What time? 0 points  Count back from 20 4 points  Months in reverse 0 points  Repeat phrase 4 points  Total Score 8   (Normal:0-7, Significant for Dysfunction: >8)  Normal Cognitive Function Screening: No: Pt scored 8 on the screening, unsure if it is due to being on the telephone or due to only having 6th grade education. Recommend repeat MMSE at next office visit with PCP.   Immunization & Health Maintenance Record Immunization History  Administered Date(s) Administered  . Influenza, High Dose Seasonal PF 03/11/2017, 03/09/2018  . Influenza,inj,Quad PF,6+ Mos 02/04/2014, 02/09/2015, 02/20/2016  . Pneumococcal Conjugate-13 08/10/2015  . Pneumococcal Polysaccharide-23 06/17/2017, 12/21/2017    Health Maintenance  Topic Date Due  . COLONOSCOPY  06/20/2000  . COLON CANCER SCREENING ANNUAL FOBT  11/05/2017  . INFLUENZA VACCINE  11/21/2018  . MAMMOGRAM  03/12/2019  . TETANUS/TDAP  04/23/2019  . DEXA SCAN  Completed  . Hepatitis C Screening  Completed  . PNA vac Low Risk Adult  Completed       Assessment  This is a routine wellness examination for Tasha [name].  Health Maintenance: Due or Overdue Health Maintenance Due  Topic Date Due  . COLONOSCOPY  06/20/2000  . COLON CANCER SCREENING ANNUAL FOBT  11/05/2017  . INFLUENZA VACCINE  11/21/2018    Tasha [name] does not need a referral for Community Assistance: Care Management:   no Social Work:    no Prescription Assistance:  no Nutrition/Diabetes Education:  no   Plan:  Personalized Goals Goals Addressed            This Visit's Progress   . DIET - INCREASE WATER INTAKE        Try to drink 6-8 glasses of  water daily.      Personalized Health Maintenance & Screening Recommendations  Td vaccine Colorectal cancer screening Shingles vaccine  Lung Cancer Screening Recommended: no (Low Dose CT Chest recommended if Age 68-80 years, 30 pack-year currently smoking OR have quit w/in past 15 years) Hepatitis C Screening recommended: no HIV Screening recommended: no  Advanced Directives: Written information was not prepared per patient's request.  Referrals & Orders No orders of the defined types were placed in this encounter.   Follow-up Plan . Follow-up with Dettinger, Fransisca Kaufmann, MD as planned . Schedule your Colonoscopy once GI has reached out to you as discussed . Consider TDAP and Shingles vaccines at your next visit with your PCP . Repeat MMSE at next visit with your PCP   I have personally reviewed and noted the following in the patient's chart:   . Medical and social history . Use of alcohol, tobacco or illicit drugs  . Current medications and supplements . Functional ability and status . Nutritional status . Physical activity . Advanced directives . List of other physicians . Hospitalizations, surgeries, and ER visits in previous 12 months . Vitals . Screenings to include cognitive, depression, and falls . Referrals and appointments  In addition, I have reviewed and discussed with Rilei Magnone certain preventive protocols, quality metrics, and best practice recommendations. A written personalized care plan for preventive services as well as general preventive health recommendations is available and can be mailed to the patient at her request.      Marylin Crosby, LPN  [id number]

## 2018-12-10 NOTE — Patient Instructions (Signed)
Preventive Care 38 Years and Older, Female Preventive care refers to lifestyle choices and visits with your health care provider that can promote health and wellness. This includes:  A yearly physical exam. This is also called an annual well check.  Regular dental and eye exams.  Immunizations.  Screening for certain conditions.  Healthy lifestyle choices, such as diet and exercise. What can I expect for my preventive care visit? Physical exam Your health care provider will check:  Height and weight. These may be used to calculate body mass index (BMI), which is a measurement that tells if you are at a healthy weight.  Heart rate and blood pressure.  Your skin for abnormal spots. Counseling Your health care provider may ask you questions about:  Alcohol, tobacco, and drug use.  Emotional well-being.  Home and relationship well-being.  Sexual activity.  Eating habits.  History of falls.  Memory and ability to understand (cognition).  Work and work Statistician.  Pregnancy and menstrual history. What immunizations do I need?  Influenza (flu) vaccine  This is recommended every year. Tetanus, diphtheria, and pertussis (Tdap) vaccine  You may need a Td booster every 10 years. Varicella (chickenpox) vaccine  You may need this vaccine if you have not already been vaccinated. Zoster (shingles) vaccine  You may need this after age 68. Pneumococcal conjugate (PCV13) vaccine  One dose is recommended after age 68. Pneumococcal polysaccharide (PPSV23) vaccine  One dose is recommended after age 68. Measles, mumps, and rubella (MMR) vaccine  You may need at least one dose of MMR if you were born in 1957 or later. You may also need a second dose. Meningococcal conjugate (MenACWY) vaccine  You may need this if you have certain conditions. Hepatitis A vaccine  You may need this if you have certain conditions or if you travel or work in places where you may be exposed  to hepatitis A. Hepatitis B vaccine  You may need this if you have certain conditions or if you travel or work in places where you may be exposed to hepatitis B. Haemophilus influenzae type b (Hib) vaccine  You may need this if you have certain conditions. You may receive vaccines as individual doses or as more than one vaccine together in one shot (combination vaccines). Talk with your health care provider about the risks and benefits of combination vaccines. What tests do I need? Blood tests  Lipid and cholesterol levels. These may be checked every 5 years, or more frequently depending on your overall health.  Hepatitis C test.  Hepatitis B test. Screening  Lung cancer screening. You may have this screening every year starting at age 68 if you have a 30-pack-year history of smoking and currently smoke or have quit within the past 15 years.  Colorectal cancer screening. All adults should have this screening starting at age 68 and continuing until age 15. Your health care provider may recommend screening at age 23 if you are at increased risk. You will have tests every 1-10 years, depending on your results and the type of screening test.  Diabetes screening. This is done by checking your blood sugar (glucose) after you have not eaten for a while (fasting). You may have this done every 1-3 years.  Mammogram. This may be done every 1-2 years. Talk with your health care provider about how often you should have regular mammograms.  BRCA-related cancer screening. This may be done if you have a family history of breast, ovarian, tubal, or peritoneal cancers.  Other tests  Sexually transmitted disease (STD) testing.  Bone density scan. This is done to screen for osteoporosis. You may have this done starting at age 68. Follow these instructions at home: Eating and drinking  Eat a diet that includes fresh fruits and vegetables, whole grains, lean protein, and low-fat dairy products. Limit  your intake of foods with high amounts of sugar, saturated fats, and salt.  Take vitamin and mineral supplements as recommended by your health care provider.  Do not drink alcohol if your health care provider tells you not to drink.  If you drink alcohol: ? Limit how much you have to 0-1 drink a day. ? Be aware of how much alcohol is in your drink. In the [place] one drink equals one 12 oz bottle of beer (355 mL), one 5 oz glass of wine (148 mL), or one 1 oz glass of hard liquor (44 mL). Lifestyle  Take daily care of your teeth and gums.  Stay active. Exercise for at least 30 minutes on 5 or more days each week.  Do not use any products that contain nicotine or tobacco, such as cigarettes, e-cigarettes, and chewing tobacco. If you need help quitting, ask your health care provider.  If you are sexually active, practice safe sex. Use a condom or other form of protection in order to prevent STIs (sexually transmitted infections).  Talk with your health care provider about taking a low-dose aspirin or statin. What's next?  Go to your health care provider once a year for a well check visit.  Ask your health care provider how often you should have your eyes and teeth checked.  Stay up to date on all vaccines. This information is not intended to replace advice given to you by your health care provider. Make sure you discuss any questions you have with your health care provider. Document Released: 05/05/2015 Document Revised: 04/02/2018 Document Reviewed: 04/02/2018 Elsevier Patient Education  2020 [employer].

## 2018-12-10 NOTE — Telephone Encounter (Signed)
Multiple attempts made to contact patient.  This encounter will now be closed  

## 2018-12-17 ENCOUNTER — Telehealth: Payer: Self-pay | Admitting: Internal Medicine

## 2018-12-17 NOTE — Telephone Encounter (Signed)
Latest CT chest still shows some fibrosis  Plan - do spirometry/dlco a  - return to [facility] followup 30 min - first available

## 2019-01-12 ENCOUNTER — Other Ambulatory Visit: Payer: Self-pay

## 2019-01-12 NOTE — Progress Notes (Signed)
Office Visit Note  Patient: Tasha [name]             Date of Birth: Sep 15, 1950           MRN: [id number]             PCP: Dettinger, Fransisca Kaufmann, MD Referring: Baruch Gouty, FNP Visit Date: 01/25/2019 Occupation: Unemployed   Interpreter: Scarlette Calico  Subjective:  Pain in multiple joints.   History of Present Illness: Tasha [name] is a 68 y.o. female seen in consultation per request of her PCP.  According to patient her symptoms are started about 4 to 6 months ago with pain in her bilateral shoulder joints and her bilateral knee joints.  She has some off-and-on discomfort in her hands.  She reports some stiffness in her hands in the morning.  She also has difficulty walking due to pain and discomfort in her bilateral knee joints and her lower back.  She states the symptoms have been going on for many years and has been progressively getting worse.  She denies any joint swelling. She was hospitalized in September 2019 with shortness of breath.  She was on ventilator for some time followed by recouping in the rehab center.  She was under care of Dr. Chase Caller.  She was diagnosed with the Boop and was oxygen dependent initially.  On chart review I noticed that she has pulmonary fibrosis now.  She is not using any oxygen per patient.  She also states that she did not require any aggressive medications for her lung disease.  She will be seeing Dr. Chase Caller every 6 months.  There is no family history of autoimmune disease.  Activities of Daily Living:  Patient reports morning stiffness for several hours.   Patient Reports nocturnal pain.  Difficulty dressing/grooming: Reports Difficulty climbing stairs: Reports Difficulty getting out of chair: Reports Difficulty using hands for taps, buttons, cutlery, and/or writing: Reports  Review of Systems  Constitutional: Positive for fatigue. Negative for night sweats, weight gain and weight loss.  HENT: Negative for mouth sores, trouble  swallowing, trouble swallowing, mouth dryness and nose dryness.   Eyes: Negative for pain, redness, itching, visual disturbance and dryness.  Respiratory: Negative for cough, shortness of breath and difficulty breathing.   Cardiovascular: Negative for chest pain, palpitations, hypertension, irregular heartbeat and swelling in legs/feet.  Gastrointestinal: Negative for blood in stool, constipation and diarrhea.  Endocrine: Negative for increased urination.  Genitourinary: Negative for difficulty urinating, painful urination and vaginal dryness.  Musculoskeletal: Positive for arthralgias, joint pain, joint swelling and morning stiffness. Negative for myalgias, muscle weakness, muscle tenderness and myalgias.  Skin: Positive for rash. Negative for color change, hair loss, skin tightness, ulcers and sensitivity to sunlight.  Allergic/Immunologic: Negative for susceptible to infections.  Neurological: Positive for weakness. Negative for dizziness, headaches, memory loss and night sweats.  Hematological: Negative for swollen glands.  Psychiatric/Behavioral: Negative for depressed mood, confusion and sleep disturbance. The patient is not nervous/anxious.     PMFS History:  Patient Active Problem List   Diagnosis Date Noted  . Chronic pain of both shoulders 11/25/2018  . Hypothyroidism 03/16/2018  . Debility 02/18/2018  . Interstitial lung disease ([place])   . Thrombocytopenia ([place])   . Prediabetes   . Persistent atrial fibrillation ([place])   . Supplemental oxygen dependent   . Metabolic syndrome   . Hypoalbuminemia due to protein-calorie malnutrition ([place])   . BOOP (bronchiolitis obliterans with organizing pneumonia) ([place])   . Depression 03/12/2016  .  BMI 25.0-25.9,adult 02/20/2016  . Allergy-induced asthma 08/10/2014  . Gastroesophageal reflux disease without esophagitis 08/10/2014  . Hyperlipidemia with target LDL less than 100 05/04/2013  . Hypertension 09/03/2012    Past Medical History:   Diagnosis Date  . Acute respiratory failure ([place])   . CAP (community acquired pneumonia)   . Hyperlipidemia   . Hypertension   . Metabolic syndrome     Family History  Problem Relation Age of Onset  . Cancer Father   . Healthy Brother   . Healthy Daughter   . Healthy Sister   . Memory loss Sister   . Healthy Son   . Healthy Son    Past Surgical History:  Procedure Laterality Date  . CESAREAN SECTION     Social History   Social History Narrative  . Not on file   Immunization History  Administered Date(s) Administered  . Influenza, High Dose Seasonal PF 03/11/2017, 03/09/2018  . Influenza,inj,Quad PF,6+ Mos 02/04/2014, 02/09/2015, 02/20/2016  . Pneumococcal Conjugate-13 08/10/2015  . Pneumococcal Polysaccharide-23 06/17/2017, 12/21/2017     Objective: Vital Signs: BP (!) 158/82 (BP Location: Right Arm, Patient Position: Sitting, Cuff Size: Normal)   Pulse 84   Resp 13   Ht 5\' 1"  (1.549 m)   Wt 146 lb (66.2 kg)   BMI 27.59 kg/m    Physical Exam Vitals signs and nursing note reviewed.  Constitutional:      Appearance: She is well-developed.  HENT:     Head: Normocephalic and atraumatic.  Eyes:     Conjunctiva/sclera: Conjunctivae normal.  Neck:     Musculoskeletal: Normal range of motion.  Cardiovascular:     Rate and Rhythm: Normal rate and regular rhythm.     Heart sounds: Normal heart sounds.  Pulmonary:     Effort: Pulmonary effort is normal.     Breath sounds: Rales present.  Abdominal:     General: Bowel sounds are normal.     Palpations: Abdomen is soft.  Lymphadenopathy:     Cervical: No cervical adenopathy.  Skin:    General: Skin is warm and dry.     Capillary Refill: Capillary refill takes less than 2 seconds.  Neurological:     Mental Status: She is alert and oriented to person, place, and time.  Psychiatric:        Behavior: Behavior normal.      Musculoskeletal Exam: He has good range of motion of her cervical spine.  She has  limited painful range of motion of lumbar spine.  Shoulder joints with good range of motion with discomfort.  Elbow joints, wrist joints, MCPs with good range of motion with no synovitis.  She has some DIP and PIP thickening.  She has good range of motion of bilateral hip joints.  She has discomfort range of motion of bilateral knee joints.  No warmth swelling or effusion was noted.  Ankle joints had no swelling.  She has some PIP and DIP thickening.  CDAI Exam: CDAI Score: - Patient Global: -; Provider Global: - Swollen: -; Tender: - Joint Exam   No joint exam has been documented for this visit   There is currently no information documented on the homunculus. Go to the Rheumatology activity and complete the homunculus joint exam.  Investigation: Findings:  11/25/18: ANA 1:320 speckled, 1:160 Homogeneous, RF 10.9, CCP 3, Lyme IgG-, RMSF IgG 1:64, IgM 1.26, TSH 1.61, T4 10.5, T3 28  Component     Latest Ref Rng & Units 11/25/2018  ANA  Titer 1      Positive (A)  RA Latex Turbid.     0.0 - [id number] IU/mL [id number]  Cyclic Citrullin Peptide Ab     0 - 19 units 3  Homogeneous Pattern      1:160 (H)  Speckled Pattern      1:320 (H)  NOTE:      Comment  RMSF, IGG, IFA     Neg <1:64 1:64 (H)    Component     Latest Ref Rng & Units 11/25/2018  TSH     0.450 - 4.500 uIU/mL 1.610  Thyroxine (T4)     4.5 - 12.0 ug/dL 10.5  T3 Uptake Ratio     24 - 39 % 28  Free Thyroxine Index     1.2 - 4.9 2.9  RMSF, IgG, EIA     Negative Positive (A)  [place] Spotted Fever, IgM     0.00 - 0.89 index 1.26 (H)  Lyme IgG/IgM Ab     0.00 - 0.90 ISR <0.91   Imaging: Xr Hand 2 View Left  Result Date: 01/25/2019 PIP and DIP narrowing was noted.  No MCP, intercarpal radiocarpal joint space narrowing was noted.  No erosive changes were noted. Impression: These findings are consistent with osteoarthritis of the hand.  Xr Hand 2 View Right  Result Date: 01/25/2019 PIP and DIP narrowing was noted.  No MCP,  intercarpal radiocarpal joint space narrowing was noted.  No erosive changes were noted. Impression: These findings are consistent with osteoarthritis of the hand.  Xr Knee 3 View Left  Result Date: 01/25/2019 Moderate medial compartment narrowing was noted.  No chondrocalcinosis was noted.  Severe patellofemoral narrowing was noted. Impression: These findings are consistent with moderate osteoarthritis and severe chondromalacia patella.  Xr Knee 3 View Right  Result Date: 01/25/2019 No significant medial lateral compartment narrowing was noted.  No chondrocalcinosis was noted.  Moderate to severe patellofemoral narrowing was noted. Impression: These findings are consistent with severe chondromalacia patella of the knee.  Xr Lumbar Spine 2-3 Views  Result Date: 01/25/2019 Mild L4-L5 spondylolisthesis was noted.  No significant disc space narrowing was noted.  Facet joint arthropathy was noted.  No SI joint changes were noted. Impression: These findings are consistent with mild spondylosis with L4-5 spondylolisthesis and facet joint arthropathy.  Xr Shoulder Left  Result Date: 01/25/2019 No glenohumeral joint space narrowing was noted.  Some acromioclavicular arthritis was noted.  No chondrocalcinosis was noted. Impression: Unremarkable x-ray of the shoulder joint except for mild acromioclavicular arthritis.  Xr Shoulder Right  Result Date: 01/25/2019 No glenohumeral joint space narrowing was noted.  Inferior spurring was noted.  High rising humerus was noted.  No chondrocalcinosis was noted. Impression: Inferior spurring was noted and high rising humerus was noted.     Recent Labs: Lab Results  Component Value Date   WBC 9.1 12/09/2018   HGB 13.0 12/09/2018   PLT 128 (L) 12/09/2018   NA 142 01/13/2019   K 3.7 01/13/2019   CL 101 01/13/2019   CO2 22 01/13/2019   GLUCOSE 101 (H) 01/13/2019   BUN 22 01/13/2019   CREATININE 1.00 01/13/2019   BILITOT 0.3 01/13/2019   ALKPHOS 89  01/13/2019   AST 33 01/13/2019   ALT 36 (H) 01/13/2019   PROT 7.0 01/13/2019   ALBUMIN 4.7 01/13/2019   CALCIUM 9.6 01/13/2019   GFRAA 67 01/13/2019  February 19, 2018 Jo 1-, double-stranded DNA negative, ANA negative  Speciality Comments: No specialty  comments available.  Procedures:  No procedures performed Allergies: Homatropine, Hydromet [hydrocodone-homatropine], and Amoxicillin   Assessment / Plan:     Visit Diagnoses: Chronic pain of both shoulders -patient has discomfort in her shoulder joints.  She has lot of discomfort with range of motion of her right shoulder joint.  X-rays today showed high riding humerus on the right.  I would like to see the blood work and try cortisone injection in her right shoulder at the follow-up visit.  I will also refer her to physical therapy.  Plan: XR Shoulder Right, XR Shoulder Left  Pain in both hands -she had no synovitis on examination.  Her x-rays were consistent with mild osteoarthritis in her hands.  Plan: XR Hand 2 View Right, XR Hand 2 View Left  Chronic pain of both knees -she has chronic pain and discomfort in her knee joints.  She has bilateral severe chondromalacia patella.  She has moderate osteoarthritis in her left knee joint.  Plan: XR KNEE 3 VIEW RIGHT, XR KNEE 3 VIEW LEFT, Sedimentation rate, Rheumatoid factor, Cyclic citrul peptide antibody, IgG, 14-3-3 eta Protein, Uric acid  Chronic midline low back pain without sciatica -she has facet joint arthropathy.  L4-L5 spondylolisthesis was also noted.  She complains of chronic discomfort in the lower back.  Plan: XR Lumbar Spine 2-3 Views  Positive ANA (antinuclear antibody) - 11/25/18: ANA 1:320 speckled, 1:160 Homogeneous, RF 10.9, CCP 3, Lyme IgG-, RMSF IgG 1:64, IgM 1.26, TSH 1.61, T4 10.5, T3 28 -to complete the work-up I will obtain following antibodies today.  Plan: ANA, RNP Antibody, Anti-Smith antibody, Sjogrens syndrome-A extractable nuclear antibody, Sjogrens syndrome-B  extractable nuclear antibody, Anti-DNA antibody, double-stranded, C3 and C4, Anti-scleroderma antibody, Uric acid  Pulmonary fibrosis (HCC)-followed by Dr. Chase Caller.  She will be following at this office every 6 months.  BOOP (bronchiolitis obliterans with organizing pneumonia) ([place])  Other fatigue  Thrombocytopenia (HCC)-she has mild chronic thrombocytopenia.  Essential hypertension-her blood pressure is still elevated.  Have advised her to follow-up closely with her PCP to monitor blood pressure.  Persistent atrial fibrillation (HCC)  Mild intermittent extrinsic asthma without complication  Gastroesophageal reflux disease without esophagitis  Acquired hypothyroidism  Hyperlipidemia with target LDL less than 123XX123  Metabolic syndrome-weight loss diet and exercise was discussed briefly.  Hypoalbuminemia due to protein-calorie malnutrition ([place])  Onychomycosis of toenail  Orders: Orders Placed This Encounter  Procedures  . XR KNEE 3 VIEW RIGHT  . XR KNEE 3 VIEW LEFT  . XR Shoulder Right  . XR Shoulder Left  . XR Lumbar Spine 2-3 Views  . XR Hand 2 View Right  . XR Hand 2 View Left  . Sedimentation rate  . Rheumatoid factor  . Cyclic citrul peptide antibody, IgG  . 14-3-3 eta Protein  . ANA  . RNP Antibody  . Anti-Smith antibody  . Sjogrens syndrome-A extractable nuclear antibody  . Sjogrens syndrome-B extractable nuclear antibody  . Anti-DNA antibody, double-stranded  . C3 and C4  . Anti-scleroderma antibody  . Uric acid  . Ambulatory referral to Physical Therapy   No orders of the defined types were placed in this encounter.   Face-to-face time spent with patient was 70 minutes. Greater than 50% of time was spent in counseling and coordination of care.  Follow-Up Instructions: Return for BOOP, +ANA.   Bo Merino, MD  Note - This record has been created using Editor, commissioning.  Chart creation errors have been sought, but may not always  have been  located. Such creation errors do not reflect on  the standard of medical care.

## 2019-01-13 ENCOUNTER — Encounter: Payer: Self-pay | Admitting: Family Medicine

## 2019-01-13 ENCOUNTER — Ambulatory Visit (INDEPENDENT_AMBULATORY_CARE_PROVIDER_SITE_OTHER): Payer: Medicare Other | Admitting: Family Medicine

## 2019-01-13 ENCOUNTER — Other Ambulatory Visit: Payer: Self-pay

## 2019-01-13 VITALS — BP 149/84 | HR 87 | Temp 97.3°F | Resp 18 | Ht 61.0 in | Wt 149.2 lb

## 2019-01-13 DIAGNOSIS — I2584 Coronary atherosclerosis due to calcified coronary lesion: Secondary | ICD-10-CM

## 2019-01-13 DIAGNOSIS — R7303 Prediabetes: Secondary | ICD-10-CM

## 2019-01-13 DIAGNOSIS — E039 Hypothyroidism, unspecified: Secondary | ICD-10-CM | POA: Diagnosis not present

## 2019-01-13 DIAGNOSIS — E785 Hyperlipidemia, unspecified: Secondary | ICD-10-CM

## 2019-01-13 DIAGNOSIS — I2510 Atherosclerotic heart disease of native coronary artery without angina pectoris: Secondary | ICD-10-CM

## 2019-01-13 DIAGNOSIS — I10 Essential (primary) hypertension: Secondary | ICD-10-CM | POA: Diagnosis not present

## 2019-01-13 DIAGNOSIS — Z1211 Encounter for screening for malignant neoplasm of colon: Secondary | ICD-10-CM | POA: Diagnosis not present

## 2019-01-13 LAB — BAYER DCA HB A1C WAIVED: HB A1C (BAYER DCA - WAIVED): 5.4 % (ref <7.0)

## 2019-01-13 MED ORDER — LOSARTAN POTASSIUM 25 MG PO TABS: 25.0000 mg | ORAL_TABLET | Freq: Every day | ORAL | 3 refills | Status: DC

## 2019-01-13 NOTE — Progress Notes (Signed)
BP (!) 149/84   Pulse 87   Temp (!) 97.3 F (36.3 C) (Temporal)   Resp 18   Ht 5' 1"  (1.549 m)   Wt 149 lb 3.2 oz (67.7 kg)   SpO2 99%   BMI 28.19 kg/m    Subjective:   [name], female    DOB: 03-03-1951, 68 y.o.   MRN: [id number]  HPI: Tasha [name] is a 68 y.o. female presenting on 01/13/2019 for Hypertension (check up of chronic medical conditons)   HPI Prediabetes  Patient comes in today for recheck of his diabetes. Patient has been currently taking no medication currently. Patient is currently on an ACE inhibitor/ARB. Patient has not seen an ophthalmologist this year. Patient denies any issues with their feet.   Hypertension Patient is currently on amlodipine and hydrochlorothiazide and losartan, and their blood pressure today is 149/84. Patient denies any lightheadedness or dizziness. Patient denies headaches, blurred vision, chest pains, shortness of breath, or weakness. Denies any side effects from medication and is content with current medication.   Hyperlipidemia Patient is coming in for recheck of his hyperlipidemia. The patient is currently taking atorvastatin. They deny any issues with myalgias or history of liver damage from it. They deny any focal numbness or weakness or chest pain.   Hypothyroidism recheck Patient is coming in for thyroid recheck today as well. They deny any issues with hair changes or heat or cold problems or diarrhea or constipation. They deny any chest pain or palpitations. They are currently on levothyroxine 50 micrograms   Relevant past medical, surgical, family and social history reviewed and updated as indicated. Interim medical history since our last visit reviewed. Allergies and medications reviewed and updated.  Review of Systems  Constitutional: Negative for chills and fever.  Eyes: Negative for visual disturbance.  Respiratory: Negative for chest tightness and shortness of breath.   Cardiovascular: Negative for  chest pain and leg swelling.  Musculoskeletal: Negative for back pain and gait problem.  Skin: Negative for rash.  Neurological: Negative for dizziness, light-headedness and headaches.  Psychiatric/Behavioral: Negative for agitation and behavioral problems.  All other systems reviewed and are negative.   Per HPI unless specifically indicated above   Objective:   BP (!) 149/84   Pulse 87   Temp (!) 97.3 F (36.3 C) (Temporal)   Resp 18   Ht 5' 1"  (1.549 m)   Wt 149 lb 3.2 oz (67.7 kg)   SpO2 99%   BMI 28.19 kg/m   Wt Readings from Last 3 Encounters:  01/13/19 149 lb 3.2 oz (67.7 kg)  12/09/18 150 lb (68 kg)  11/25/18 151 lb (68.5 kg)    Physical Exam Vitals signs and nursing note reviewed.  Constitutional:      General: She is not in acute distress.    Appearance: She is well-developed. She is not diaphoretic.  Eyes:     Conjunctiva/sclera: Conjunctivae normal.  Cardiovascular:     Rate and Rhythm: Normal rate and regular rhythm.     Heart sounds: Normal heart sounds. No murmur.  Pulmonary:     Effort: Pulmonary effort is normal. No respiratory distress.     Breath sounds: Normal breath sounds. No wheezing.  Musculoskeletal: Normal range of motion.        General: No tenderness.  Skin:    General: Skin is warm and dry.     Findings: No rash.  Neurological:     Mental Status: She is alert and oriented to  person, place, and time.     Coordination: Coordination normal.  Psychiatric:        Behavior: Behavior normal.       Assessment & Plan:   Problem List Items Addressed This Visit      Cardiovascular and Mediastinum   Hypertension - Primary   Relevant Medications   losartan (COZAAR) 25 MG tablet   Other Relevant Orders   CMP14+EGFR (Completed)     Endocrine   Hypothyroidism   Relevant Orders   TSH (Completed)     Other   Hyperlipidemia with target LDL less than 100   Relevant Medications   losartan (COZAAR) 25 MG tablet   Prediabetes    Relevant Orders   Bayer DCA Hb A1c Waived (Completed)    Other Visit Diagnoses    Colon cancer screening       Relevant Orders   Ambulatory referral to Gastroenterology      Blood pressures look great at home Follow up plan: Return in about 3 months (around 04/14/2019), or if symptoms worsen or fail to improve, for Hypertension and thyroid recheck.  Counseling provided for all of the vaccine components Orders Placed This Encounter  Procedures  . Bayer DCA Hb A1c Waived  . TSH  . CMP14+EGFR  . Ambulatory referral to Gastroenterology    Caryl Pina, MD [facility] 01/13/2019, 4:51 PM

## 2019-01-13 NOTE — Telephone Encounter (Signed)
Attempted to call patient with interpreter services. Patient was not available, left message for patient to call back.

## 2019-01-14 LAB — CMP14+EGFR
ALT: 36 IU/L — ABNORMAL HIGH (ref 0–32)
AST: 33 IU/L (ref 0–40)
Albumin/Globulin Ratio: 2 (ref 1.2–2.2)
Albumin: 4.7 g/dL (ref 3.8–4.8)
Alkaline Phosphatase: 89 IU/L (ref 39–117)
BUN/Creatinine Ratio: 22 (ref 12–28)
BUN: 22 mg/dL (ref 8–27)
Bilirubin Total: 0.3 mg/dL (ref 0.0–1.2)
CO2: 22 mmol/L (ref 20–29)
Calcium: 9.6 mg/dL (ref 8.7–10.3)
Chloride: 101 mmol/L (ref 96–106)
Creatinine, Ser: 1 mg/dL (ref 0.57–1.00)
GFR calc Af Amer: 67 mL/min/{1.73_m2} (ref >59)
GFR calc non Af Amer: 58 mL/min/{1.73_m2} — ABNORMAL LOW (ref >59)
Globulin, Total: 2.3 g/dL (ref 1.5–4.5)
Glucose: 101 mg/dL — ABNORMAL HIGH (ref 65–99)
Potassium: 3.7 mmol/L (ref 3.5–5.2)
Sodium: 142 mmol/L (ref 134–144)
Total Protein: 7 g/dL (ref 6.0–8.5)

## 2019-01-14 LAB — TSH: TSH: 1.28 u[IU]/mL (ref 0.450–4.500)

## 2019-01-19 ENCOUNTER — Encounter: Payer: Self-pay | Admitting: Internal Medicine

## 2019-01-25 ENCOUNTER — Ambulatory Visit: Payer: Self-pay

## 2019-01-25 ENCOUNTER — Other Ambulatory Visit: Payer: Self-pay

## 2019-01-25 ENCOUNTER — Ambulatory Visit (INDEPENDENT_AMBULATORY_CARE_PROVIDER_SITE_OTHER): Payer: Medicare Other | Admitting: Rheumatology

## 2019-01-25 ENCOUNTER — Encounter: Payer: Self-pay | Admitting: Rheumatology

## 2019-01-25 VITALS — BP 158/82 | HR 84 | Resp 13 | Ht 61.0 in | Wt 146.0 lb

## 2019-01-25 DIAGNOSIS — G8929 Other chronic pain: Secondary | ICD-10-CM | POA: Diagnosis not present

## 2019-01-25 DIAGNOSIS — M79642 Pain in left hand: Secondary | ICD-10-CM | POA: Diagnosis not present

## 2019-01-25 DIAGNOSIS — M79641 Pain in right hand: Secondary | ICD-10-CM

## 2019-01-25 DIAGNOSIS — Z789 Other specified health status: Secondary | ICD-10-CM

## 2019-01-25 DIAGNOSIS — I4819 Other persistent atrial fibrillation: Secondary | ICD-10-CM

## 2019-01-25 DIAGNOSIS — I2510 Atherosclerotic heart disease of native coronary artery without angina pectoris: Secondary | ICD-10-CM | POA: Diagnosis not present

## 2019-01-25 DIAGNOSIS — R5383 Other fatigue: Secondary | ICD-10-CM | POA: Diagnosis not present

## 2019-01-25 DIAGNOSIS — I10 Essential (primary) hypertension: Secondary | ICD-10-CM

## 2019-01-25 DIAGNOSIS — M545 Low back pain, unspecified: Secondary | ICD-10-CM

## 2019-01-25 DIAGNOSIS — D696 Thrombocytopenia, unspecified: Secondary | ICD-10-CM | POA: Diagnosis not present

## 2019-01-25 DIAGNOSIS — J4520 Mild intermittent asthma, uncomplicated: Secondary | ICD-10-CM | POA: Diagnosis not present

## 2019-01-25 DIAGNOSIS — E8809 Other disorders of plasma-protein metabolism, not elsewhere classified: Secondary | ICD-10-CM

## 2019-01-25 DIAGNOSIS — R768 Other specified abnormal immunological findings in serum: Secondary | ICD-10-CM | POA: Diagnosis not present

## 2019-01-25 DIAGNOSIS — E785 Hyperlipidemia, unspecified: Secondary | ICD-10-CM

## 2019-01-25 DIAGNOSIS — I2584 Coronary atherosclerosis due to calcified coronary lesion: Secondary | ICD-10-CM | POA: Diagnosis not present

## 2019-01-25 DIAGNOSIS — K219 Gastro-esophageal reflux disease without esophagitis: Secondary | ICD-10-CM

## 2019-01-25 DIAGNOSIS — J8489 Other specified interstitial pulmonary diseases: Secondary | ICD-10-CM | POA: Diagnosis not present

## 2019-01-25 DIAGNOSIS — J8410 Pulmonary fibrosis, unspecified: Secondary | ICD-10-CM | POA: Diagnosis not present

## 2019-01-25 DIAGNOSIS — E8881 Metabolic syndrome: Secondary | ICD-10-CM

## 2019-01-25 DIAGNOSIS — E46 Unspecified protein-calorie malnutrition: Secondary | ICD-10-CM

## 2019-01-25 DIAGNOSIS — M25562 Pain in left knee: Secondary | ICD-10-CM

## 2019-01-25 DIAGNOSIS — M25511 Pain in right shoulder: Secondary | ICD-10-CM

## 2019-01-25 DIAGNOSIS — M25512 Pain in left shoulder: Secondary | ICD-10-CM | POA: Diagnosis not present

## 2019-01-25 DIAGNOSIS — B351 Tinea unguium: Secondary | ICD-10-CM

## 2019-01-25 DIAGNOSIS — E039 Hypothyroidism, unspecified: Secondary | ICD-10-CM

## 2019-01-25 DIAGNOSIS — Z603 Acculturation difficulty: Secondary | ICD-10-CM

## 2019-01-25 DIAGNOSIS — M25561 Pain in right knee: Secondary | ICD-10-CM | POA: Diagnosis not present

## 2019-01-25 NOTE — Patient Instructions (Signed)
Shoulder Exercises Ask your health care provider which exercises are safe for you. Do exercises exactly as told by your health care provider and adjust them as directed. It is normal to feel mild stretching, pulling, tightness, or discomfort as you do these exercises. Stop right away if you feel sudden pain or your pain gets worse. Do not begin these exercises until told by your health care provider. Stretching exercises External rotation and abduction This exercise is sometimes called corner stretch. This exercise rotates your arm outward (external rotation) and moves your arm out from your body (abduction). 1. Stand in a doorway with one of your feet slightly in front of the other. This is called a staggered stance. If you cannot reach your forearms to the door frame, stand facing a corner of a room. 2. Choose one of the following positions as told by your health care provider: ? Place your hands and forearms on the door frame above your head. ? Place your hands and forearms on the door frame at the height of your head. ? Place your hands on the door frame at the height of your elbows. 3. Slowly move your weight onto your front foot until you feel a stretch across your chest and in the front of your shoulders. Keep your head and chest upright and keep your abdominal muscles tight. 4. Hold for __________ seconds. 5. To release the stretch, shift your weight to your back foot. Repeat __________ times. Complete this exercise __________ times a day. Extension, standing 1. Stand and hold a broomstick, a cane, or a similar object behind your back. ? Your hands should be a little wider than shoulder width apart. ? Your palms should face away from your back. 2. Keeping your elbows straight and your shoulder muscles relaxed, move the stick away from your body until you feel a stretch in your shoulders (extension). ? Avoid shrugging your shoulders while you move the stick. Keep your shoulder blades tucked  down toward the middle of your back. 3. Hold for __________ seconds. 4. Slowly return to the starting position. Repeat __________ times. Complete this exercise __________ times a day. Range-of-motion exercises Pendulum  1. Stand near a wall or a surface that you can hold onto for balance. 2. Bend at the waist and let your left / right arm hang straight down. Use your other arm to support you. Keep your back straight and do not lock your knees. 3. Relax your left / right arm and shoulder muscles, and move your hips and your trunk so your left / right arm swings freely. Your arm should swing because of the motion of your body, not because you are using your arm or shoulder muscles. 4. Keep moving your hips and trunk so your arm swings in the following directions, as told by your health care provider: ? Side to side. ? Forward and backward. ? In clockwise and counterclockwise circles. 5. Continue each motion for __________ seconds, or for as long as told by your health care provider. 6. Slowly return to the starting position. Repeat __________ times. Complete this exercise __________ times a day. Shoulder flexion, standing  1. Stand and hold a broomstick, a cane, or a similar object. Place your hands a little more than shoulder width apart on the object. Your left / right hand should be palm up, and your other hand should be palm down. 2. Keep your elbow straight and your shoulder muscles relaxed. Push the stick up with your healthy arm to  raise your left / right arm in front of your body, and then over your head until you feel a stretch in your shoulder (flexion). ? Avoid shrugging your shoulder while you raise your arm. Keep your shoulder blade tucked down toward the middle of your back. 3. Hold for __________ seconds. 4. Slowly return to the starting position. Repeat __________ times. Complete this exercise __________ times a day. Shoulder abduction, standing 1. Stand and hold a broomstick,  a cane, or a similar object. Place your hands a little more than shoulder width apart on the object. Your left / right hand should be palm up, and your other hand should be palm down. 2. Keep your elbow straight and your shoulder muscles relaxed. Push the object across your body toward your left / right side. Raise your left / right arm to the side of your body (abduction) until you feel a stretch in your shoulder. ? Do not raise your arm above shoulder height unless your health care provider tells you to do that. ? If directed, raise your arm over your head. ? Avoid shrugging your shoulder while you raise your arm. Keep your shoulder blade tucked down toward the middle of your back. 3. Hold for __________ seconds. 4. Slowly return to the starting position. Repeat __________ times. Complete this exercise __________ times a day. Internal rotation  1. Place your left / right hand behind your back, palm up. 2. Use your other hand to dangle an exercise band, a towel, or a similar object over your shoulder. Grasp the band with your left / right hand so you are holding on to both ends. 3. Gently pull up on the band until you feel a stretch in the front of your left / right shoulder. The movement of your arm toward the center of your body is called internal rotation. ? Avoid shrugging your shoulder while you raise your arm. Keep your shoulder blade tucked down toward the middle of your back. 4. Hold for __________ seconds. 5. Release the stretch by letting go of the band and lowering your hands. Repeat __________ times. Complete this exercise __________ times a day. Strengthening exercises External rotation  1. Sit in a stable chair without armrests. 2. Secure an exercise band to a stable object at elbow height on your left / right side. 3. Place a soft object, such as a folded towel or a small pillow, between your left / right upper arm and your body to move your elbow about 4 inches (10 cm) away  from your side. 4. Hold the end of the exercise band so it is tight and there is no slack. 5. Keeping your elbow pressed against the soft object, slowly move your forearm out, away from your abdomen (external rotation). Keep your body steady so only your forearm moves. 6. Hold for __________ seconds. 7. Slowly return to the starting position. Repeat __________ times. Complete this exercise __________ times a day. Shoulder abduction  1. Sit in a stable chair without armrests, or stand up. 2. Hold a __________ weight in your left / right hand, or hold an exercise band with both hands. 3. Start with your arms straight down and your left / right palm facing in, toward your body. 4. Slowly lift your left / right hand out to your side (abduction). Do not lift your hand above shoulder height unless your health care provider tells you that this is safe. ? Keep your arms straight. ? Avoid shrugging your shoulder while you  do this movement. Keep your shoulder blade tucked down toward the middle of your back. 5. Hold for __________ seconds. 6. Slowly lower your arm, and return to the starting position. Repeat __________ times. Complete this exercise __________ times a day. Shoulder extension 1. Sit in a stable chair without armrests, or stand up. 2. Secure an exercise band to a stable object in front of you so it is at shoulder height. 3. Hold one end of the exercise band in each hand. Your palms should face each other. 4. Straighten your elbows and lift your hands up to shoulder height. 5. Step back, away from the secured end of the exercise band, until the band is tight and there is no slack. 6. Squeeze your shoulder blades together as you pull your hands down to the sides of your thighs (extension). Stop when your hands are straight down by your sides. Do not let your hands go behind your body. 7. Hold for __________ seconds. 8. Slowly return to the starting position. Repeat __________ times.  Complete this exercise __________ times a day. Shoulder row 1. Sit in a stable chair without armrests, or stand up. 2. Secure an exercise band to a stable object in front of you so it is at waist height. 3. Hold one end of the exercise band in each hand. Position your palms so that your thumbs are facing the ceiling (neutral position). 4. Bend each of your elbows to a 90-degree angle (right angle) and keep your upper arms at your sides. 5. Step back until the band is tight and there is no slack. 6. Slowly pull your elbows back behind you. 7. Hold for __________ seconds. 8. Slowly return to the starting position. Repeat __________ times. Complete this exercise __________ times a day. Shoulder press-ups  1. Sit in a stable chair that has armrests. Sit upright, with your feet flat on the floor. 2. Put your hands on the armrests so your elbows are bent and your fingers are pointing forward. Your hands should be about even with the sides of your body. 3. Push down on the armrests and use your arms to lift yourself off the chair. Straighten your elbows and lift yourself up as much as you comfortably can. ? Move your shoulder blades down, and avoid letting your shoulders move up toward your ears. ? Keep your feet on the ground. As you get stronger, your feet should support less of your body weight as you lift yourself up. 4. Hold for __________ seconds. 5. Slowly lower yourself back into the chair. Repeat __________ times. Complete this exercise __________ times a day. Wall push-ups  1. Stand so you are facing a stable wall. Your feet should be about one arm-length away from the wall. 2. Lean forward and place your palms on the wall at shoulder height. 3. Keep your feet flat on the floor as you bend your elbows and lean forward toward the wall. 4. Hold for __________ seconds. 5. Straighten your elbows to push yourself back to the starting position. Repeat __________ times. Complete this exercise  __________ times a day. This information is not intended to replace advice given to you by your health care provider. Make sure you discuss any questions you have with your health care provider. Document Released: 02/20/2005 Document Revised: 07/31/2018 Document Reviewed: 05/08/2018 Elsevier Patient Education  2020 [place]. Back Exercises The following exercises strengthen the muscles that help to support the trunk and back. They also help to keep the lower back  flexible. Doing these exercises can help to prevent back pain or lessen existing pain.  If you have back pain or discomfort, try doing these exercises 2-3 times each day or as told by your health care provider.  As your pain improves, do them once each day, but increase the number of times that you repeat the steps for each exercise (do more repetitions).  To prevent the recurrence of back pain, continue to do these exercises once each day or as told by your health care provider. Do exercises exactly as told by your health care provider and adjust them as directed. It is normal to feel mild stretching, pulling, tightness, or discomfort as you do these exercises, but you should stop right away if you feel sudden pain or your pain gets worse. Exercises Single knee to chest Repeat these steps 3-5 times for each leg: 1. Lie on your back on a firm bed or the floor with your legs extended. 2. Bring one knee to your chest. Your other leg should stay extended and in contact with the floor. 3. Hold your knee in place by grabbing your knee or thigh with both hands and hold. 4. Pull on your knee until you feel a gentle stretch in your lower back or buttocks. 5. Hold the stretch for 10-30 seconds. 6. Slowly release and straighten your leg. Pelvic tilt Repeat these steps 5-10 times: 1. Lie on your back on a firm bed or the floor with your legs extended. 2. Bend your knees so they are pointing toward the ceiling and your feet are flat on the  floor. 3. Tighten your lower abdominal muscles to press your lower back against the floor. This motion will tilt your pelvis so your tailbone points up toward the ceiling instead of pointing to your feet or the floor. 4. With gentle tension and even breathing, hold this position for 5-10 seconds. Cat-cow Repeat these steps until your lower back becomes more flexible: 1. Get into a hands-and-knees position on a firm surface. Keep your hands under your shoulders, and keep your knees under your hips. You may place padding under your knees for comfort. 2. Let your head hang down toward your chest. Contract your abdominal muscles and point your tailbone toward the floor so your lower back becomes rounded like the back of a cat. 3. Hold this position for 5 seconds. 4. Slowly lift your head, let your abdominal muscles relax and point your tailbone up toward the ceiling so your back forms a sagging arch like the back of a cow. 5. Hold this position for 5 seconds.  Press-ups Repeat these steps 5-10 times: 1. Lie on your abdomen (face-down) on the floor. 2. Place your palms near your head, about shoulder-width apart. 3. Keeping your back as relaxed as possible and keeping your hips on the floor, slowly straighten your arms to raise the top half of your body and lift your shoulders. Do not use your back muscles to raise your upper torso. You may adjust the placement of your hands to make yourself more comfortable. 4. Hold this position for 5 seconds while you keep your back relaxed. 5. Slowly return to lying flat on the floor.  Bridges Repeat these steps 10 times: 1. Lie on your back on a firm surface. 2. Bend your knees so they are pointing toward the ceiling and your feet are flat on the floor. Your arms should be flat at your sides, next to your body. 3. Tighten your buttocks muscles  and lift your buttocks off the floor until your waist is at almost the same height as your knees. You should feel the  muscles working in your buttocks and the back of your thighs. If you do not feel these muscles, slide your feet 1-2 inches farther away from your buttocks. 4. Hold this position for 3-5 seconds. 5. Slowly lower your hips to the starting position, and allow your buttocks muscles to relax completely. If this exercise is too easy, try doing it with your arms crossed over your chest. Abdominal crunches Repeat these steps 5-10 times: 1. Lie on your back on a firm bed or the floor with your legs extended. 2. Bend your knees so they are pointing toward the ceiling and your feet are flat on the floor. 3. Cross your arms over your chest. 4. Tip your chin slightly toward your chest without bending your neck. 5. Tighten your abdominal muscles and slowly raise your trunk (torso) high enough to lift your shoulder blades a tiny bit off the floor. Avoid raising your torso higher than that because it can put too much stress on your low back and does not help to strengthen your abdominal muscles. 6. Slowly return to your starting position. Back lifts Repeat these steps 5-10 times: 1. Lie on your abdomen (face-down) with your arms at your sides, and rest your forehead on the floor. 2. Tighten the muscles in your legs and your buttocks. 3. Slowly lift your chest off the floor while you keep your hips pressed to the floor. Keep the back of your head in line with the curve in your back. Your eyes should be looking at the floor. 4. Hold this position for 3-5 seconds. 5. Slowly return to your starting position. Contact a health care provider if:  Your back pain or discomfort gets much worse when you do an exercise.  Your worsening back pain or discomfort does not lessen within 2 hours after you exercise. If you have any of these problems, stop doing these exercises right away. Do not do them again unless your health care provider says that you can. Get help right away if:  You develop sudden, severe back pain.  If this happens, stop doing the exercises right away. Do not do them again unless your health care provider says that you can. This information is not intended to replace advice given to you by your health care provider. Make sure you discuss any questions you have with your health care provider. Document Released: 05/16/2004 Document Revised: 08/13/2018 Document Reviewed: 01/08/2018 Elsevier Patient Education  2020 [place] for [employer]ers, 15(4), [phone]. Retrieved January 26, 2018 from [email or web address].com/nursing">  Knee Exercises Ask your health care provider which exercises are safe for you. Do exercises exactly as told by your health care provider and adjust them as directed. It is normal to feel mild stretching, pulling, tightness, or discomfort as you do these exercises. Stop right away if you feel sudden pain or your pain gets worse. Do not begin these exercises until told by your health care provider. Stretching and range-of-motion exercises These exercises warm up your muscles and joints and improve the movement and flexibility of your knee. These exercises also help to relieve pain and swelling. Knee extension, prone 1. Lie on your abdomen (prone position) on a bed. 2. Place your left / right knee just beyond the edge of the surface so your knee is not on the bed. You can put a towel under your left /  right thigh just above your kneecap for comfort. 3. Relax your leg muscles and allow gravity to straighten your knee (extension). You should feel a stretch behind your left / right knee. 4. Hold this position for __________ seconds. 5. Scoot up so your knee is supported between repetitions. Repeat __________ times. Complete this exercise __________ times a day. Knee flexion, active  1. Lie on your back with both legs straight. If this causes back discomfort, bend your left / right knee so your foot is flat on the floor. 2. Slowly slide your left / right heel back  toward your buttocks. Stop when you feel a gentle stretch in the front of your knee or thigh (flexion). 3. Hold this position for __________ seconds. 4. Slowly slide your left / right heel back to the starting position. Repeat __________ times. Complete this exercise __________ times a day. Quadriceps stretch, prone  1. Lie on your abdomen on a firm surface, such as a bed or padded floor. 2. Bend your left / right knee and hold your ankle. If you cannot reach your ankle or pant leg, loop a belt around your foot and grab the belt instead. 3. Gently pull your heel toward your buttocks. Your knee should not slide out to the side. You should feel a stretch in the front of your thigh and knee (quadriceps). 4. Hold this position for __________ seconds. Repeat __________ times. Complete this exercise __________ times a day. Hamstring, supine 1. Lie on your back (supine position). 2. Loop a belt or towel over the ball of your left / right foot. The ball of your foot is on the walking surface, right under your toes. 3. Straighten your left / right knee and slowly pull on the belt to raise your leg until you feel a gentle stretch behind your knee (hamstring). ? Do not let your knee bend while you do this. ? Keep your other leg flat on the floor. 4. Hold this position for __________ seconds. Repeat __________ times. Complete this exercise __________ times a day. Strengthening exercises These exercises build strength and endurance in your knee. Endurance is the ability to use your muscles for a long time, even after they get tired. Quadriceps, isometric This exercise stretches the muscles in front of your thigh (quadriceps) without moving your knee joint (isometric). 1. Lie on your back with your left / right leg extended and your other knee bent. Put a rolled towel or small pillow under your knee if told by your health care provider. 2. Slowly tense the muscles in the front of your left / right thigh.  You should see your kneecap slide up toward your hip or see increased dimpling just above the knee. This motion will push the back of the knee toward the floor. 3. For __________ seconds, hold the muscle as tight as you can without increasing your pain. 4. Relax the muscles slowly and completely. Repeat __________ times. Complete this exercise __________ times a day. Straight leg raises This exercise stretches the muscles in front of your thigh (quadriceps) and the muscles that move your hips (hip flexors). 1. Lie on your back with your left / right leg extended and your other knee bent. 2. Tense the muscles in the front of your left / right thigh. You should see your kneecap slide up or see increased dimpling just above the knee. Your thigh may even shake a bit. 3. Keep these muscles tight as you raise your leg 4-6 inches (10-15 cm) off  the floor. Do not let your knee bend. 4. Hold this position for __________ seconds. 5. Keep these muscles tense as you lower your leg. 6. Relax your muscles slowly and completely after each repetition. Repeat __________ times. Complete this exercise __________ times a day. Hamstring, isometric 1. Lie on your back on a firm surface. 2. Bend your left / right knee about __________ degrees. 3. Dig your left / right heel into the surface as if you are trying to pull it toward your buttocks. Tighten the muscles in the back of your thighs (hamstring) to "dig" as hard as you can without increasing any pain. 4. Hold this position for __________ seconds. 5. Release the tension gradually and allow your muscles to relax completely for __________ seconds after each repetition. Repeat __________ times. Complete this exercise __________ times a day. Hamstring curls If told by your health care provider, do this exercise while wearing ankle weights. Begin with __________ lb weights. Then increase the weight by 1 lb (0.5 kg) increments. Do not wear ankle weights that are more  than __________ lb. 1. Lie on your abdomen with your legs straight. 2. Bend your left / right knee as far as you can without feeling pain. Keep your hips flat against the floor. 3. Hold this position for __________ seconds. 4. Slowly lower your leg to the starting position. Repeat __________ times. Complete this exercise __________ times a day. Squats This exercise strengthens the muscles in front of your thigh and knee (quadriceps). 1. Stand in front of a table, with your feet and knees pointing straight ahead. You may rest your hands on the table for balance but not for support. 2. Slowly bend your knees and lower your hips like you are going to sit in a chair. ? Keep your weight over your heels, not over your toes. ? Keep your lower legs upright so they are parallel with the table legs. ? Do not let your hips go lower than your knees. ? Do not bend lower than told by your health care provider. ? If your knee pain increases, do not bend as low. 3. Hold the squat position for __________ seconds. 4. Slowly push with your legs to return to standing. Do not use your hands to pull yourself to standing. Repeat __________ times. Complete this exercise __________ times a day. Wall slides This exercise strengthens the muscles in front of your thigh and knee (quadriceps). 1. Lean your back against a smooth wall or door, and walk your feet out 18-24 inches (46-61 cm) from it. 2. Place your feet hip-width apart. 3. Slowly slide down the wall or door until your knees bend __________ degrees. Keep your knees over your heels, not over your toes. Keep your knees in line with your hips. 4. Hold this position for __________ seconds. Repeat __________ times. Complete this exercise __________ times a day. Straight leg raises This exercise strengthens the muscles that rotate the leg at the hip and move it away from your body (hip abductors). 1. Lie on your side with your left / right leg in the top position.  Lie so your head, shoulder, knee, and hip line up. You may bend your bottom knee to help you keep your balance. 2. Roll your hips slightly forward so your hips are stacked directly over each other and your left / right knee is facing forward. 3. Leading with your heel, lift your top leg 4-6 inches (10-15 cm). You should feel the muscles in your outer hip  lifting. ? Do not let your foot drift forward. ? Do not let your knee roll toward the ceiling. 4. Hold this position for __________ seconds. 5. Slowly return your leg to the starting position. 6. Let your muscles relax completely after each repetition. Repeat __________ times. Complete this exercise __________ times a day. Straight leg raises This exercise stretches the muscles that move your hips away from the front of the pelvis (hip extensors). 1. Lie on your abdomen on a firm surface. You can put a pillow under your hips if that is more comfortable. 2. Tense the muscles in your buttocks and lift your left / right leg about 4-6 inches (10-15 cm). Keep your knee straight as you lift your leg. 3. Hold this position for __________ seconds. 4. Slowly lower your leg to the starting position. 5. Let your leg relax completely after each repetition. Repeat __________ times. Complete this exercise __________ times a day. This information is not intended to replace advice given to you by your health care provider. Make sure you discuss any questions you have with your health care provider. Document Released: 02/20/2005 Document Revised: 01/27/2018 Document Reviewed: 01/27/2018 Elsevier Patient Education  2020 [employer].

## 2019-01-30 LAB — ANTI-NUCLEAR AB-TITER (ANA TITER)
ANA TITER: 1:320 {titer} — ABNORMAL HIGH
ANA Titer 1: 1:640 {titer} — ABNORMAL HIGH

## 2019-01-30 LAB — ANTI-DNA ANTIBODY, DOUBLE-STRANDED: ds DNA Ab: <1 IU/mL

## 2019-01-30 LAB — URIC ACID: Uric Acid, Serum: 5.6 mg/dL (ref 2.5–7.0)

## 2019-01-30 LAB — ANTI-SMITH ANTIBODY: ENA SM Ab Ser-aCnc: <1 AI

## 2019-01-30 LAB — CYCLIC CITRUL PEPTIDE ANTIBODY, IGG: Cyclic Citrullin Peptide Ab: <16 UNITS

## 2019-01-30 LAB — RHEUMATOID FACTOR: Rhuematoid fact SerPl-aCnc: <14 IU/mL (ref <14)

## 2019-01-30 LAB — RNP ANTIBODY: Ribonucleic Protein(ENA) Antibody, IgG: <1 AI

## 2019-01-30 LAB — SJOGRENS SYNDROME-B EXTRACTABLE NUCLEAR ANTIBODY: SSB (La) (ENA) Antibody, IgG: <1 AI

## 2019-01-30 LAB — ANTI-SCLERODERMA ANTIBODY: Scleroderma (Scl-70) (ENA) Antibody, IgG: <1 AI

## 2019-01-30 LAB — ANA: Anti Nuclear Antibody (ANA): POSITIVE — AB

## 2019-01-30 LAB — SJOGRENS SYNDROME-A EXTRACTABLE NUCLEAR ANTIBODY: SSA (Ro) (ENA) Antibody, IgG: <1 AI

## 2019-01-30 LAB — C3 AND C4
C3 Complement: 126 mg/dL (ref 83–193)
C4 Complement: 34 mg/dL (ref 15–57)

## 2019-01-30 LAB — SEDIMENTATION RATE: Sed Rate: 14 mm/h (ref 0–30)

## 2019-01-30 LAB — 14-3-3 ETA PROTEIN: 14-3-3 eta Protein: <0.2 ng/mL (ref <0.2)

## 2019-02-02 NOTE — Progress Notes (Deleted)
Office Visit Note  Patient: Tasha [name]             Date of Birth: May 11, 1950           MRN: [id number]             PCP: Dettinger, Fransisca Kaufmann, MD Referring: Dettinger, Fransisca Kaufmann, MD Visit Date: 02/16/2019 Occupation: _0 @  Subjective:  No chief complaint on file.   History of Present Illness: Tasha [name] is a 68 y.o. female ***   Activities of Daily Living:  Patient reports morning stiffness for *** {minute/hour:19697}.   Patient {ACTIONS;DENIES/REPORTS:21021675::"Denies"} nocturnal pain.  Difficulty dressing/grooming: {ACTIONS;DENIES/REPORTS:21021675::"Denies"} Difficulty climbing stairs: {ACTIONS;DENIES/REPORTS:21021675::"Denies"} Difficulty getting out of chair: {ACTIONS;DENIES/REPORTS:21021675::"Denies"} Difficulty using hands for taps, buttons, cutlery, and/or writing: {ACTIONS;DENIES/REPORTS:21021675::"Denies"}  No Rheumatology ROS completed.   PMFS History:  Patient Active Problem List   Diagnosis Date Noted  . Chronic pain of both shoulders 11/25/2018  . Hypothyroidism 03/16/2018  . Debility 02/18/2018  . Interstitial lung disease ([place])   . Thrombocytopenia ([place])   . Prediabetes   . Persistent atrial fibrillation ([place])   . Supplemental oxygen dependent   . Metabolic syndrome   . Hypoalbuminemia due to protein-calorie malnutrition ([place])   . BOOP (bronchiolitis obliterans with organizing pneumonia) ([place])   . Depression 03/12/2016  . BMI 25.0-25.9,adult 02/20/2016  . Allergy-induced asthma 08/10/2014  . Gastroesophageal reflux disease without esophagitis 08/10/2014  . Hyperlipidemia with target LDL less than 100 05/04/2013  . Hypertension 09/03/2012    Past Medical History:  Diagnosis Date  . Acute respiratory failure ([place])   . CAP (community acquired pneumonia)   . Hyperlipidemia   . Hypertension   . Metabolic syndrome     Family History  Problem Relation Age of Onset  . Cancer Father   . Healthy Brother   . Healthy Daughter   . Healthy  Sister   . Memory loss Sister   . Healthy Son   . Healthy Son    Past Surgical History:  Procedure Laterality Date  . CESAREAN SECTION     Social History   Social History Narrative  . Not on file   Immunization History  Administered Date(s) Administered  . Influenza, High Dose Seasonal PF 03/11/2017, 03/09/2018  . Influenza,inj,Quad PF,6+ Mos 02/04/2014, 02/09/2015, 02/20/2016  . Pneumococcal Conjugate-13 08/10/2015  . Pneumococcal Polysaccharide-23 06/17/2017, 12/21/2017     Objective: Vital Signs: There were no vitals taken for this visit.   Physical Exam   Musculoskeletal Exam: ***  CDAI Exam: CDAI Score: - Patient Global: -; Provider Global: - Swollen: -; Tender: - Joint Exam   No joint exam has been documented for this visit   There is currently no information documented on the homunculus. Go to the Rheumatology activity and complete the homunculus joint exam.  Investigation: No additional findings.  Imaging: Xr Hand 2 View Left  Result Date: 01/25/2019 PIP and DIP narrowing was noted.  No MCP, intercarpal radiocarpal joint space narrowing was noted.  No erosive changes were noted. Impression: These findings are consistent with osteoarthritis of the hand.  Xr Hand 2 View Right  Result Date: 01/25/2019 PIP and DIP narrowing was noted.  No MCP, intercarpal radiocarpal joint space narrowing was noted.  No erosive changes were noted. Impression: These findings are consistent with osteoarthritis of the hand.  Xr Knee 3 View Left  Result Date: 01/25/2019 Moderate medial compartment narrowing was noted.  No chondrocalcinosis was noted.  Severe patellofemoral narrowing was noted. Impression: These findings are consistent with  moderate osteoarthritis and severe chondromalacia patella.  Xr Knee 3 View Right  Result Date: 01/25/2019 No significant medial lateral compartment narrowing was noted.  No chondrocalcinosis was noted.  Moderate to severe patellofemoral  narrowing was noted. Impression: These findings are consistent with severe chondromalacia patella of the knee.  Xr Lumbar Spine 2-3 Views  Result Date: 01/25/2019 Mild L4-L5 spondylolisthesis was noted.  No significant disc space narrowing was noted.  Facet joint arthropathy was noted.  No SI joint changes were noted. Impression: These findings are consistent with mild spondylosis with L4-5 spondylolisthesis and facet joint arthropathy.  Xr Shoulder Left  Result Date: 01/25/2019 No glenohumeral joint space narrowing was noted.  Some acromioclavicular arthritis was noted.  No chondrocalcinosis was noted. Impression: Unremarkable x-ray of the shoulder joint except for mild acromioclavicular arthritis.  Xr Shoulder Right  Result Date: 01/25/2019 No glenohumeral joint space narrowing was noted.  Inferior spurring was noted.  High rising humerus was noted.  No chondrocalcinosis was noted. Impression: Inferior spurring was noted and high rising humerus was noted.     Recent Labs: Lab Results  Component Value Date   WBC 9.1 12/09/2018   HGB 13.0 12/09/2018   PLT 128 (L) 12/09/2018   NA 142 01/13/2019   K 3.7 01/13/2019   CL 101 01/13/2019   CO2 22 01/13/2019   GLUCOSE 101 (H) 01/13/2019   BUN 22 01/13/2019   CREATININE 1.00 01/13/2019   BILITOT 0.3 01/13/2019   ALKPHOS 89 01/13/2019   AST 33 01/13/2019   ALT 36 (H) 01/13/2019   PROT 7.0 01/13/2019   ALBUMIN 4.7 01/13/2019   CALCIUM 9.6 01/13/2019   GFRAA 67 01/13/2019  January 25, 2019 ANA 1: 640 nucleolar, 1: 320 speckled, ENA negative, C3-C4 normal, ESR 14, RF negative, anti-CCP negative, _0 eta negative, uric acid 5.6  Speciality Comments: No specialty comments available.  Procedures:  No procedures performed Allergies: Homatropine, Hydromet [hydrocodone-homatropine], and Amoxicillin   Assessment / Plan:     Visit Diagnoses: No diagnosis found.  Orders: No orders of the defined types were placed in this encounter.  No  orders of the defined types were placed in this encounter.   Face-to-face time spent with patient was *** minutes. Greater than 50% of time was spent in counseling and coordination of care.  Follow-Up Instructions: No follow-ups on file.   Bo Merino, MD  Note - This record has been created using Editor, commissioning.  Chart creation errors have been sought, but may not always  have been located. Such creation errors do not reflect on  the standard of medical care.

## 2019-02-03 DIAGNOSIS — M545 Low back pain: Secondary | ICD-10-CM | POA: Diagnosis not present

## 2019-02-03 DIAGNOSIS — M25511 Pain in right shoulder: Secondary | ICD-10-CM | POA: Diagnosis not present

## 2019-02-03 DIAGNOSIS — G8929 Other chronic pain: Secondary | ICD-10-CM | POA: Diagnosis not present

## 2019-02-03 DIAGNOSIS — M25512 Pain in left shoulder: Secondary | ICD-10-CM | POA: Diagnosis not present

## 2019-02-08 DIAGNOSIS — M25512 Pain in left shoulder: Secondary | ICD-10-CM | POA: Diagnosis not present

## 2019-02-08 DIAGNOSIS — G8929 Other chronic pain: Secondary | ICD-10-CM | POA: Diagnosis not present

## 2019-02-08 DIAGNOSIS — M25511 Pain in right shoulder: Secondary | ICD-10-CM | POA: Diagnosis not present

## 2019-02-08 DIAGNOSIS — M545 Low back pain: Secondary | ICD-10-CM | POA: Diagnosis not present

## 2019-02-11 DIAGNOSIS — M25511 Pain in right shoulder: Secondary | ICD-10-CM | POA: Diagnosis not present

## 2019-02-11 DIAGNOSIS — M25512 Pain in left shoulder: Secondary | ICD-10-CM | POA: Diagnosis not present

## 2019-02-11 DIAGNOSIS — M545 Low back pain: Secondary | ICD-10-CM | POA: Diagnosis not present

## 2019-02-11 DIAGNOSIS — G8929 Other chronic pain: Secondary | ICD-10-CM | POA: Diagnosis not present

## 2019-02-15 DIAGNOSIS — M25511 Pain in right shoulder: Secondary | ICD-10-CM | POA: Diagnosis not present

## 2019-02-15 DIAGNOSIS — G8929 Other chronic pain: Secondary | ICD-10-CM | POA: Diagnosis not present

## 2019-02-15 DIAGNOSIS — M25512 Pain in left shoulder: Secondary | ICD-10-CM | POA: Diagnosis not present

## 2019-02-15 DIAGNOSIS — M545 Low back pain: Secondary | ICD-10-CM | POA: Diagnosis not present

## 2019-02-16 ENCOUNTER — Ambulatory Visit: Payer: Medicare Other | Admitting: Rheumatology

## 2019-02-18 DIAGNOSIS — M25512 Pain in left shoulder: Secondary | ICD-10-CM | POA: Diagnosis not present

## 2019-02-18 DIAGNOSIS — M545 Low back pain: Secondary | ICD-10-CM | POA: Diagnosis not present

## 2019-02-18 DIAGNOSIS — M25511 Pain in right shoulder: Secondary | ICD-10-CM | POA: Diagnosis not present

## 2019-02-18 DIAGNOSIS — G8929 Other chronic pain: Secondary | ICD-10-CM | POA: Diagnosis not present

## 2019-02-22 DIAGNOSIS — M545 Low back pain: Secondary | ICD-10-CM | POA: Diagnosis not present

## 2019-02-22 DIAGNOSIS — M25512 Pain in left shoulder: Secondary | ICD-10-CM | POA: Diagnosis not present

## 2019-02-22 DIAGNOSIS — M25511 Pain in right shoulder: Secondary | ICD-10-CM | POA: Diagnosis not present

## 2019-02-22 DIAGNOSIS — G8929 Other chronic pain: Secondary | ICD-10-CM | POA: Diagnosis not present

## 2019-02-24 ENCOUNTER — Ambulatory Visit (INDEPENDENT_AMBULATORY_CARE_PROVIDER_SITE_OTHER): Payer: Self-pay | Admitting: *Deleted

## 2019-02-24 ENCOUNTER — Other Ambulatory Visit: Payer: Self-pay

## 2019-02-24 DIAGNOSIS — Z1211 Encounter for screening for malignant neoplasm of colon: Secondary | ICD-10-CM

## 2019-02-24 MED ORDER — PEG 3350-KCL-NA BICARB-NACL 420 G PO SOLR: 4000.0000 mL | Freq: Once | ORAL | 0 refills | Status: AC

## 2019-02-24 NOTE — Progress Notes (Signed)
Ok to schedule.

## 2019-02-24 NOTE — Progress Notes (Signed)
Gastroenterology Pre-Procedure Review  Request Date: 02/24/2019 Requesting Physician: Dr. Warrick Parisian @ [facility], no previous TCS  PATIENT REVIEW QUESTIONS: The patient responded to the following health history questions as indicated:    1. Diabetes Melitis: no 2. Joint replacements in the past 12 months: no 3. Major health problems in the past 3 months: no 4. Has an artificial valve or MVP: no 5. Has a defibrillator: no 6. Has been advised in past to take antibiotics in advance of a procedure like teeth cleaning: no 7. Family history of colon cancer: no  8. Alcohol Use: no 9. Illicit drug Use: no 10. History of sleep apnea: no  11. History of coronary artery or other vascular stents placed within the last 12 months: no 12. History of any prior anesthesia complications: no 13. There is no height or weight on file to calculate BMI. ht: 5'1 wt: 148 lbs    MEDICATIONS & ALLERGIES:    Patient reports the following regarding taking any blood thinners:   Plavix? no Aspirin? yes Coumadin? no Brilinta? no Xarelto? no Eliquis? no Pradaxa? no Savaysa? no Effient? no  Patient confirms/reports the following medications:  Current Outpatient Medications  Medication Sig Dispense Refill  . amLODipine (NORVASC) 5 MG tablet Take 1 tablet (5 mg total) by mouth at bedtime. 90 tablet 3  . aspirin EC 81 MG tablet Take 1 tablet (81 mg total) by mouth daily. 90 tablet 3  . atorvastatin (LIPITOR) 20 MG tablet Take 1 tablet (20 mg total) by mouth daily at 6 PM. 90 tablet 3  . cetirizine (ZYRTEC) 10 MG tablet Take 1 tablet (10 mg total) by mouth daily as needed for allergies. 90 tablet 3  . Efinaconazole 10 % SOLN Apply 1 application topically daily. 8 mL 6  . hydrochlorothiazide (HYDRODIURIL) 25 MG tablet Take 1 tablet (25 mg total) by mouth daily. 90 tablet 3  . Ipratropium-Albuterol (COMBIVENT RESPIMAT) 20-100 MCG/ACT AERS respimat Inhale 1 puff into the lungs every 6 (six) hours as needed for wheezing. 4  g 5  . levothyroxine (SYNTHROID) 50 MCG tablet Take 1 tablet (50 mcg total) by mouth daily before breakfast. 90 tablet 3  . losartan (COZAAR) 25 MG tablet Take 1 tablet (25 mg total) by mouth daily. 90 tablet 3  . Omega 3 1000 MG CAPS Take by mouth daily.     . pantoprazole (PROTONIX) 40 MG tablet Take 1 tablet (40 mg total) by mouth daily. 30 tablet 3   No current facility-administered medications for this visit.     Patient confirms/reports the following allergies:  Allergies  Allergen Reactions  . Homatropine     Pt doesn't remember  . Hydromet [Hydrocodone-Homatropine]     Pt doesn't remember  . Amoxicillin Rash    No orders of the defined types were placed in this encounter.   AUTHORIZATION INFORMATION Primary Insurance: Medicare,  ID #: [id number] Pre-Cert / Auth required: No, not required  SCHEDULE INFORMATION: Procedure has been scheduled as follows:  Date: 06/07/2019, Time: 12:45 Location: [facility] with Dr. Oneida Alar  This Gastroenterology Pre-Precedure Review Form is being routed to the following provider(s): Neil Crouch, PA-C

## 2019-02-24 NOTE — Patient Instructions (Signed)
Tasha [name]   03-Mar-1951 MRN: [id number]    Procedure Date: 06/07/2019 Time to register: 11:45 am Place to register: [facility] Procedure Time: 12:45 pm Scheduled provider: Dr. Oneida Alar  PREPARATION FOR COLONOSCOPY WITH TRI-LYTE SPLIT PREP  Please notify us immediately if you are diabetic, take iron supplements, or if you are on Coumadin or any other blood thinners.    You will need to purchase 1 fleet enema and 1 box of Bisacodyl '5mg'$  tablets.   1 DAY BEFORE PROCEDURE:  DATE: 06/06/2019   DAY: Sunday Continue clear liquids the entire day - NO SOLID FOOD.   At 2:00 pm:  Take 2 Bisacodyl tablets.   At 4:00pm:  Start drinking your solution. Make sure you mix well per instructions on the bottle. Try to drink 1 (one) 8 ounce glass every 10-15 minutes until you have consumed HALF the jug. You should complete by 6:00pm.You must keep the left over solution refrigerated until completed next day.  Continue clear liquids. You must drink plenty of clear liquids to prevent dehyration and kidney failure.     DAY OF PROCEDURE:   DATE: 06/07/2019   DAY: Monday If you take medications for your heart, blood pressure or breathing, you may take these medications.   Five hours before your procedure time @ 7:45 am:  Finish remaining amout of bowel prep, drinking 1 (one) 8 ounce glass every 10-15 minutes until complete. You have two hours to consume remaining prep.   Three hours before your procedure time @ 9:45 am:  Nothing by mouth.   At least one hour before going to the hospital:  Give yourself one Fleet enema. You may take your morning medications with sip of water unless we have instructed otherwise.      Please see below for Dietary Information.  CLEAR LIQUIDS INCLUDE:  Water Jello (NOT red in color)   Ice Popsicles (NOT red in color)   Tea (sugar ok, no milk/cream) Powdered fruit flavored drinks  Coffee (sugar ok, no milk/cream) Gatorade/ Lemonade/ Kool-Aid  (NOT red in  color)   Juice: apple, white grape, white cranberry Soft drinks  Clear bullion, consomme, broth (fat free beef/chicken/vegetable)  Carbonated beverages (any kind)  Strained chicken noodle soup Hard Candy   Remember: Clear liquids are liquids that will allow you to see your fingers on the other side of a clear glass. Be sure liquids are NOT red in color, and not cloudy, but CLEAR.  DO NOT EAT OR DRINK ANY OF THE FOLLOWING:  Dairy products of any kind   Cranberry juice Tomato juice / V8 juice   Grapefruit juice Orange juice     Red grape juice  Do not eat any solid foods, including such foods as: cereal, oatmeal, yogurt, fruits, vegetables, creamed soups, eggs, bread, crackers, pureed foods in a blender, etc.   HELPFUL HINTS FOR DRINKING PREP SOLUTION:   Make sure prep is extremely cold. Mix and refrigerate the the morning of the prep. You may also put in the freezer.   You may try mixing some Crystal Light or Country Time Lemonade if you prefer. Mix in small amounts; add more if necessary.  Try drinking through a straw  Rinse mouth with water or a mouthwash between glasses, to remove after-taste.  Try sipping on a cold beverage /ice/ popsicles between glasses of prep.  Place a piece of sugar-free hard candy in mouth between glasses.  If you become nauseated, try consuming smaller amounts, or stretch out the  time between glasses. Stop for 30-60 minutes, then slowly start back drinking.        OTHER INSTRUCTIONS  You will need a responsible adult at least 68 years of age to accompany you and drive you home. This person must remain in the waiting room during your procedure. The hospital will cancel your procedure if you do not have a responsible adult with you.   1. Wear loose fitting clothing that is easily removed. 2. Leave jewelry and other valuables at home.  3. Remove all body piercing jewelry and leave at home. 4. Total time from sign-in until discharge is approximately  2-3 hours. 5. You should go home directly after your procedure and rest. You can resume normal activities the day after your procedure. 6. The day of your procedure you should not:  Drive  Make legal decisions  Operate machinery  Drink alcohol  Return to work   You may call the office (Dept: [phone]) before 5:00pm, or page the doctor on call [phone]) after 5:00pm, for further instructions, if necessary.   Insurance Information YOU WILL NEED TO CHECK WITH YOUR INSURANCE COMPANY FOR THE BENEFITS OF COVERAGE YOU HAVE FOR THIS PROCEDURE.  UNFORTUNATELY, NOT ALL INSURANCE COMPANIES HAVE BENEFITS TO COVER ALL OR PART OF THESE TYPES OF PROCEDURES.  IT IS YOUR RESPONSIBILITY TO CHECK YOUR BENEFITS, HOWEVER, WE WILL BE GLAD TO ASSIST YOU WITH ANY CODES YOUR INSURANCE COMPANY MAY NEED.    PLEASE NOTE THAT MOST INSURANCE COMPANIES WILL NOT COVER A SCREENING COLONOSCOPY FOR PEOPLE UNDER THE AGE OF 50  IF YOU HAVE BCBS INSURANCE, YOU MAY HAVE BENEFITS FOR A SCREENING COLONOSCOPY BUT IF POLYPS ARE FOUND THE DIAGNOSIS WILL CHANGE AND THEN YOU MAY HAVE A DEDUCTIBLE THAT WILL NEED TO BE MET. SO PLEASE MAKE SURE YOU CHECK YOUR BENEFITS FOR A SCREENING COLONOSCOPY AS WELL AS A DIAGNOSTIC COLONOSCOPY.

## 2019-02-25 DIAGNOSIS — M545 Low back pain: Secondary | ICD-10-CM | POA: Diagnosis not present

## 2019-02-25 DIAGNOSIS — M25512 Pain in left shoulder: Secondary | ICD-10-CM | POA: Diagnosis not present

## 2019-02-25 DIAGNOSIS — M25511 Pain in right shoulder: Secondary | ICD-10-CM | POA: Diagnosis not present

## 2019-02-25 DIAGNOSIS — G8929 Other chronic pain: Secondary | ICD-10-CM | POA: Diagnosis not present

## 2019-02-25 NOTE — Addendum Note (Signed)
Addended by: Metro Kung on: 02/25/2019 07:59 AM   Modules accepted: Orders, SmartSet

## 2019-03-01 ENCOUNTER — Ambulatory Visit (INDEPENDENT_AMBULATORY_CARE_PROVIDER_SITE_OTHER): Payer: Medicare Other

## 2019-03-01 ENCOUNTER — Other Ambulatory Visit: Payer: Self-pay

## 2019-03-01 DIAGNOSIS — Z23 Encounter for immunization: Secondary | ICD-10-CM | POA: Diagnosis not present

## 2019-03-01 DIAGNOSIS — M545 Low back pain: Secondary | ICD-10-CM | POA: Diagnosis not present

## 2019-03-01 DIAGNOSIS — M25511 Pain in right shoulder: Secondary | ICD-10-CM | POA: Diagnosis not present

## 2019-03-01 DIAGNOSIS — G8929 Other chronic pain: Secondary | ICD-10-CM | POA: Diagnosis not present

## 2019-03-01 DIAGNOSIS — M25512 Pain in left shoulder: Secondary | ICD-10-CM | POA: Diagnosis not present

## 2019-03-02 ENCOUNTER — Ambulatory Visit: Payer: Medicare Other

## 2019-03-02 DIAGNOSIS — Z1231 Encounter for screening mammogram for malignant neoplasm of breast: Secondary | ICD-10-CM | POA: Diagnosis not present

## 2019-03-03 ENCOUNTER — Other Ambulatory Visit: Payer: Self-pay | Admitting: *Deleted

## 2019-03-03 DIAGNOSIS — K219 Gastro-esophageal reflux disease without esophagitis: Secondary | ICD-10-CM

## 2019-03-03 MED ORDER — PANTOPRAZOLE SODIUM 40 MG PO TBEC: 40.0000 mg | DELAYED_RELEASE_TABLET | Freq: Every day | ORAL | 0 refills | Status: DC

## 2019-03-04 DIAGNOSIS — M25512 Pain in left shoulder: Secondary | ICD-10-CM | POA: Diagnosis not present

## 2019-03-04 DIAGNOSIS — M25511 Pain in right shoulder: Secondary | ICD-10-CM | POA: Diagnosis not present

## 2019-03-04 DIAGNOSIS — G8929 Other chronic pain: Secondary | ICD-10-CM | POA: Diagnosis not present

## 2019-03-04 DIAGNOSIS — M545 Low back pain: Secondary | ICD-10-CM | POA: Diagnosis not present

## 2019-03-08 DIAGNOSIS — M25512 Pain in left shoulder: Secondary | ICD-10-CM | POA: Diagnosis not present

## 2019-03-08 DIAGNOSIS — G8929 Other chronic pain: Secondary | ICD-10-CM | POA: Diagnosis not present

## 2019-03-08 DIAGNOSIS — M545 Low back pain: Secondary | ICD-10-CM | POA: Diagnosis not present

## 2019-03-08 DIAGNOSIS — M25511 Pain in right shoulder: Secondary | ICD-10-CM | POA: Diagnosis not present

## 2019-03-11 ENCOUNTER — Other Ambulatory Visit: Payer: Self-pay | Admitting: *Deleted

## 2019-03-11 DIAGNOSIS — G8929 Other chronic pain: Secondary | ICD-10-CM | POA: Diagnosis not present

## 2019-03-11 DIAGNOSIS — M25511 Pain in right shoulder: Secondary | ICD-10-CM | POA: Diagnosis not present

## 2019-03-11 DIAGNOSIS — I10 Essential (primary) hypertension: Secondary | ICD-10-CM

## 2019-03-11 DIAGNOSIS — M545 Low back pain: Secondary | ICD-10-CM | POA: Diagnosis not present

## 2019-03-11 DIAGNOSIS — M25512 Pain in left shoulder: Secondary | ICD-10-CM | POA: Diagnosis not present

## 2019-03-11 MED ORDER — HYDROCHLOROTHIAZIDE 25 MG PO TABS: 25.0000 mg | ORAL_TABLET | Freq: Every day | ORAL | 0 refills | Status: DC

## 2019-03-11 NOTE — Addendum Note (Signed)
Addended by: Antonietta Barcelona D on: 03/11/2019 10:17 AM   Modules accepted: Orders

## 2019-03-15 DIAGNOSIS — M545 Low back pain: Secondary | ICD-10-CM | POA: Diagnosis not present

## 2019-03-15 DIAGNOSIS — M25512 Pain in left shoulder: Secondary | ICD-10-CM | POA: Diagnosis not present

## 2019-03-15 DIAGNOSIS — M25511 Pain in right shoulder: Secondary | ICD-10-CM | POA: Diagnosis not present

## 2019-03-15 DIAGNOSIS — G8929 Other chronic pain: Secondary | ICD-10-CM | POA: Diagnosis not present

## 2019-03-17 DIAGNOSIS — M545 Low back pain: Secondary | ICD-10-CM | POA: Diagnosis not present

## 2019-03-17 DIAGNOSIS — G8929 Other chronic pain: Secondary | ICD-10-CM | POA: Diagnosis not present

## 2019-03-17 DIAGNOSIS — M25512 Pain in left shoulder: Secondary | ICD-10-CM | POA: Diagnosis not present

## 2019-03-17 DIAGNOSIS — M25511 Pain in right shoulder: Secondary | ICD-10-CM | POA: Diagnosis not present

## 2019-04-14 ENCOUNTER — Encounter: Payer: Self-pay | Admitting: Family Medicine

## 2019-04-14 ENCOUNTER — Ambulatory Visit (INDEPENDENT_AMBULATORY_CARE_PROVIDER_SITE_OTHER): Payer: Medicare Other | Admitting: Family Medicine

## 2019-04-14 ENCOUNTER — Other Ambulatory Visit: Payer: Self-pay

## 2019-04-14 VITALS — BP 133/75 | HR 101 | Temp 99.1°F | Ht 61.0 in | Wt 143.6 lb

## 2019-04-14 DIAGNOSIS — E785 Hyperlipidemia, unspecified: Secondary | ICD-10-CM

## 2019-04-14 DIAGNOSIS — R7303 Prediabetes: Secondary | ICD-10-CM | POA: Diagnosis not present

## 2019-04-14 DIAGNOSIS — I10 Essential (primary) hypertension: Secondary | ICD-10-CM

## 2019-04-14 DIAGNOSIS — I2584 Coronary atherosclerosis due to calcified coronary lesion: Secondary | ICD-10-CM

## 2019-04-14 DIAGNOSIS — R05 Cough: Secondary | ICD-10-CM

## 2019-04-14 DIAGNOSIS — E039 Hypothyroidism, unspecified: Secondary | ICD-10-CM

## 2019-04-14 DIAGNOSIS — K219 Gastro-esophageal reflux disease without esophagitis: Secondary | ICD-10-CM | POA: Diagnosis not present

## 2019-04-14 DIAGNOSIS — I2510 Atherosclerotic heart disease of native coronary artery without angina pectoris: Secondary | ICD-10-CM

## 2019-04-14 DIAGNOSIS — R053 Chronic cough: Secondary | ICD-10-CM

## 2019-04-14 LAB — BAYER DCA HB A1C WAIVED: HB A1C (BAYER DCA - WAIVED): 5.6 % (ref <7.0)

## 2019-04-14 MED ORDER — PANTOPRAZOLE SODIUM 40 MG PO TBEC: 40.0000 mg | DELAYED_RELEASE_TABLET | Freq: Every day | ORAL | 3 refills | Status: DC

## 2019-04-14 MED ORDER — HYDROCHLOROTHIAZIDE 25 MG PO TABS: 25.0000 mg | ORAL_TABLET | Freq: Every day | ORAL | 3 refills | Status: DC

## 2019-04-14 NOTE — Progress Notes (Signed)
BP 133/75   Pulse (!) 101   Temp 99.1 F (37.3 C) (Temporal)   Ht 5' 1"  (1.549 m)   Wt 143 lb 9.6 oz (65.1 kg)   SpO2 96%   BMI 27.13 kg/m    Subjective:   [name], female    DOB: March 27, 1951, 68 y.o.   MRN: [id number]  HPI: Tasha [name] is a 68 y.o. female presenting on 04/14/2019 for Hypertension (3 month follow up)    HPI Hypertension Patient is currently on Amlodipine and hydrochlorothiazide and losartan, and their blood pressure today is 133/75. Patient denies any lightheadedness or dizziness. Patient denies headaches, blurred vision, chest pains, shortness of breath, or weakness. Denies any side effects from medication and is content with current medication. ''  Prediabetes Patient comes in today for recheck of his diabetes. Patient has been currently taking no medication is diet controlled. Patient is currently on an ACE inhibitor/ARB. Patient has not seen an ophthalmologist this year. Patient denies any issues with their feet.   Hypothyroidism recheck Patient is coming in for thyroid recheck today as well. They deny any issues with hair changes or heat or cold problems or diarrhea or constipation. They deny any chest pain or palpitations. They are currently on levothyroxine 14mcrograms  GERD Patient is currently on pantoprazole.  She denies any major symptoms or abdominal pain or belching or burping. She denies any blood in her stool or lightheadedness or dizziness.  Hyperlipidemia Patient is coming in for recheck of his hyperlipidemia. The patient is currently taking atorvastatin. They deny any issues with myalgias or history of liver damage from it. They deny any focal numbness or weakness or chest pain.  Patient comes in still complaining of chronic cough that she gets and has been having, with both treatment for GERD and allergies and it does not seem to be improving.  We also switched her lisinopril to an ARB still did not improve.  Relevant  past medical, surgical, family and social history reviewed and updated as indicated. Interim medical history since our last visit reviewed. Allergies and medications reviewed and updated.  Review of Systems  Constitutional: Negative for chills and fever.  HENT: Negative for congestion, ear discharge and ear pain.   Eyes: Negative for visual disturbance.  Respiratory: Positive for cough. Negative for chest tightness, shortness of breath and wheezing.   Cardiovascular: Negative for chest pain, palpitations and leg swelling.  Genitourinary: Negative for difficulty urinating and dysuria.  Musculoskeletal: Negative for back pain and gait problem.  Skin: Negative for rash.  Neurological: Negative for light-headedness and headaches.  Psychiatric/Behavioral: Negative for agitation and behavioral problems.  All other systems reviewed and are negative.   Per HPI unless specifically indicated above   Allergies as of 04/14/2019      Reactions   Homatropine    Pt doesn't remember   Hydromet [hydrocodone-homatropine]    Pt doesn't remember   Amoxicillin Rash      Medication List       Accurate as of April 14, 2019  9:06 AM. If you have any questions, ask your nurse or doctor.        amLODipine 5 MG tablet Commonly known as: NORVASC Take 1 tablet (5 mg total) by mouth at bedtime.   aspirin EC 81 MG tablet Take 1 tablet (81 mg total) by mouth daily.   atorvastatin 20 MG tablet Commonly known as: LIPITOR Take 1 tablet (20 mg total) by mouth daily at 6 PM.  cetirizine 10 MG tablet Commonly known as: ZYRTEC Take 1 tablet (10 mg total) by mouth daily as needed for allergies.   Combivent Respimat 20-100 MCG/ACT Aers respimat Generic drug: Ipratropium-Albuterol Inhale 1 puff into the lungs every 6 (six) hours as needed for wheezing.   Efinaconazole 10 % Soln Apply 1 application topically daily.   hydrochlorothiazide 25 MG tablet Commonly known as: HYDRODIURIL Take 1 tablet  (25 mg total) by mouth daily.   levothyroxine 50 MCG tablet Commonly known as: SYNTHROID Take 1 tablet (50 mcg total) by mouth daily before breakfast.   losartan 25 MG tablet Commonly known as: Cozaar Take 1 tablet (25 mg total) by mouth daily.   Omega 3 1000 MG Caps Take by mouth daily.   pantoprazole 40 MG tablet Commonly known as: PROTONIX Take 1 tablet (40 mg total) by mouth daily.        Objective:   BP 133/75   Pulse (!) 101   Temp 99.1 F (37.3 C) (Temporal)   Ht 5' 1"  (1.549 m)   Wt 143 lb 9.6 oz (65.1 kg)   SpO2 96%   BMI 27.13 kg/m   Wt Readings from Last 3 Encounters:  04/14/19 143 lb 9.6 oz (65.1 kg)  01/25/19 146 lb (66.2 kg)  01/13/19 149 lb 3.2 oz (67.7 kg)    Physical Exam Vitals and nursing note reviewed.  Constitutional:      General: She is not in acute distress.    Appearance: She is well-developed. She is not diaphoretic.  Eyes:     General:        Right eye: No discharge.        Left eye: No discharge.     Extraocular Movements: Extraocular movements intact.     Conjunctiva/sclera: Conjunctivae normal.     Pupils: Pupils are equal, round, and reactive to light.  Cardiovascular:     Rate and Rhythm: Normal rate and regular rhythm.     Heart sounds: Normal heart sounds. No murmur.  Pulmonary:     Effort: Pulmonary effort is normal. No respiratory distress.     Breath sounds: Normal breath sounds. No wheezing.  Musculoskeletal:        General: No tenderness. Normal range of motion.  Skin:    General: Skin is warm and dry.     Findings: No rash.  Neurological:     Mental Status: She is alert and oriented to person, place, and time.     Coordination: Coordination normal.  Psychiatric:        Behavior: Behavior normal.       Assessment & Plan:   Problem List Items Addressed This Visit      Cardiovascular and Mediastinum   Hypertension   Relevant Medications   hydrochlorothiazide (HYDRODIURIL) 25 MG tablet   Other Relevant  Orders   CMP14+EGFR     Digestive   Gastroesophageal reflux disease without esophagitis   Relevant Medications   pantoprazole (PROTONIX) 40 MG tablet   Other Relevant Orders   CBC with Differential/Platelet     Endocrine   Hypothyroidism   Relevant Orders   TSH     Other   Hyperlipidemia with target LDL less than 100   Relevant Medications   hydrochlorothiazide (HYDRODIURIL) 25 MG tablet   Other Relevant Orders   Lipid panel   Prediabetes - Primary   Relevant Orders   hgba1c    Other Visit Diagnoses    Chronic cough  Relevant Orders   Ambulatory referral to ENT     Patient does say she is complaining of night vision issues when she is driving and also some floaters plan for her to go to an eye exam.  Nothing visualized today Patient seems to be doing well with blood pressures, will check blood sugars, she did just check it yesterday was 115. Patient is still having a chronic cough and will do a referral to ENT for her. Follow up plan: Return in about 3 months (around 07/13/2019), or if symptoms worsen or fail to improve, for Recheck hypertension and prediabetes.  Counseling provided for all of the vaccine components Orders Placed This Encounter  Procedures  . CBC with Differential/Platelet  . CMP14+EGFR  . Lipid panel  . hgba1c  . TSH  . Ambulatory referral to ENT    Caryl Pina, MD [facility] 04/14/2019, 9:06 AM

## 2019-04-15 LAB — CBC WITH DIFFERENTIAL/PLATELET
Basophils Absolute: 0 10*3/uL (ref 0.0–0.2)
Basos: 0 %
EOS (ABSOLUTE): 0.3 10*3/uL (ref 0.0–0.4)
Eos: 4 %
Hematocrit: 37.4 % (ref 34.0–46.6)
Hemoglobin: 12.9 g/dL (ref 11.1–15.9)
Immature Grans (Abs): 0 10*3/uL (ref 0.0–0.1)
Immature Granulocytes: 0 %
Lymphocytes Absolute: 1.4 10*3/uL (ref 0.7–3.1)
Lymphs: 17 %
MCH: 30.1 pg (ref 26.6–33.0)
MCHC: 34.5 g/dL (ref 31.5–35.7)
MCV: 87 fL (ref 79–97)
Monocytes Absolute: 1.1 10*3/uL — ABNORMAL HIGH (ref 0.1–0.9)
Monocytes: 13 %
Neutrophils Absolute: 5.5 10*3/uL (ref 1.4–7.0)
Neutrophils: 66 %
Platelets: 150 10*3/uL (ref 150–450)
RBC: 4.28 x10E6/uL (ref 3.77–5.28)
RDW: 12.9 % (ref 11.7–15.4)
WBC: 8.4 10*3/uL (ref 3.4–10.8)

## 2019-04-15 LAB — CMP14+EGFR
ALT: 30 IU/L (ref 0–32)
AST: 30 IU/L (ref 0–40)
Albumin/Globulin Ratio: 1.8 (ref 1.2–2.2)
Albumin: 4.5 g/dL (ref 3.8–4.8)
Alkaline Phosphatase: 87 IU/L (ref 39–117)
BUN/Creatinine Ratio: 18 (ref 12–28)
BUN: 14 mg/dL (ref 8–27)
Bilirubin Total: 0.4 mg/dL (ref 0.0–1.2)
CO2: 25 mmol/L (ref 20–29)
Calcium: 9.5 mg/dL (ref 8.7–10.3)
Chloride: 99 mmol/L (ref 96–106)
Creatinine, Ser: 0.77 mg/dL (ref 0.57–1.00)
GFR calc Af Amer: 92 mL/min/{1.73_m2} (ref >59)
GFR calc non Af Amer: 80 mL/min/{1.73_m2} (ref >59)
Globulin, Total: 2.5 g/dL (ref 1.5–4.5)
Glucose: 114 mg/dL — ABNORMAL HIGH (ref 65–99)
Potassium: 3.5 mmol/L (ref 3.5–5.2)
Sodium: 141 mmol/L (ref 134–144)
Total Protein: 7 g/dL (ref 6.0–8.5)

## 2019-04-15 LAB — LIPID PANEL
Chol/HDL Ratio: 3.1 ratio (ref 0.0–4.4)
Cholesterol, Total: 116 mg/dL (ref 100–199)
HDL: 37 mg/dL — ABNORMAL LOW (ref >39)
LDL Chol Calc (NIH): 60 mg/dL (ref 0–99)
Triglycerides: 101 mg/dL (ref 0–149)
VLDL Cholesterol Cal: 19 mg/dL (ref 5–40)

## 2019-04-15 LAB — TSH: TSH: 1.6 u[IU]/mL (ref 0.450–4.500)

## 2019-04-20 ENCOUNTER — Telehealth: Payer: Self-pay | Admitting: Family Medicine

## 2019-04-20 NOTE — Telephone Encounter (Signed)
Refer to labs  °

## 2019-04-20 NOTE — Progress Notes (Signed)
Lmtcb.

## 2019-05-21 DIAGNOSIS — Z23 Encounter for immunization: Secondary | ICD-10-CM | POA: Diagnosis not present

## 2019-06-03 ENCOUNTER — Telehealth: Payer: Self-pay | Admitting: *Deleted

## 2019-06-03 NOTE — Telephone Encounter (Signed)
Lmom for pt to call us back.  Need to move her procedure up.

## 2019-06-04 ENCOUNTER — Other Ambulatory Visit (HOSPITAL_COMMUNITY)
Admission: RE | Admit: 2019-06-04 | Discharge: 2019-06-04 | Disposition: A | Discharge status: Home / Self Care | Payer: Medicare Other | Source: Ambulatory Visit | Attending: Gastroenterology | Admitting: Gastroenterology

## 2019-06-04 ENCOUNTER — Other Ambulatory Visit: Payer: Self-pay

## 2019-06-04 DIAGNOSIS — Z20822 Contact with and (suspected) exposure to covid-19: Secondary | ICD-10-CM | POA: Diagnosis not present

## 2019-06-04 DIAGNOSIS — Z01812 Encounter for preprocedural laboratory examination: Secondary | ICD-10-CM | POA: Diagnosis not present

## 2019-06-04 LAB — SARS CORONAVIRUS 2 (TAT 6-24 HRS): SARS Coronavirus 2: NEGATIVE

## 2019-06-07 ENCOUNTER — Ambulatory Visit (HOSPITAL_COMMUNITY)
Admission: RE | Admit: 2019-06-07 | Discharge: 2019-06-07 | Disposition: A | Discharge status: Home / Self Care | Payer: Medicare Other | Attending: Gastroenterology | Admitting: Gastroenterology

## 2019-06-07 ENCOUNTER — Encounter (HOSPITAL_COMMUNITY): Payer: Self-pay | Admitting: Gastroenterology

## 2019-06-07 ENCOUNTER — Encounter (HOSPITAL_COMMUNITY)
Admission: RE | Disposition: A | Discharge status: Home / Self Care | Payer: Self-pay | Source: Home / Self Care | Attending: Gastroenterology

## 2019-06-07 ENCOUNTER — Other Ambulatory Visit: Payer: Self-pay

## 2019-06-07 DIAGNOSIS — K5730 Diverticulosis of large intestine without perforation or abscess without bleeding: Secondary | ICD-10-CM | POA: Insufficient documentation

## 2019-06-07 DIAGNOSIS — Z87891 Personal history of nicotine dependence: Secondary | ICD-10-CM | POA: Diagnosis not present

## 2019-06-07 DIAGNOSIS — Z7982 Long term (current) use of aspirin: Secondary | ICD-10-CM | POA: Insufficient documentation

## 2019-06-07 DIAGNOSIS — I10 Essential (primary) hypertension: Secondary | ICD-10-CM | POA: Diagnosis not present

## 2019-06-07 DIAGNOSIS — D123 Benign neoplasm of transverse colon: Secondary | ICD-10-CM | POA: Insufficient documentation

## 2019-06-07 DIAGNOSIS — Z79890 Hormone replacement therapy (postmenopausal): Secondary | ICD-10-CM | POA: Diagnosis not present

## 2019-06-07 DIAGNOSIS — Z1211 Encounter for screening for malignant neoplasm of colon: Secondary | ICD-10-CM | POA: Diagnosis not present

## 2019-06-07 DIAGNOSIS — K635 Polyp of colon: Secondary | ICD-10-CM

## 2019-06-07 DIAGNOSIS — Q439 Congenital malformation of intestine, unspecified: Secondary | ICD-10-CM | POA: Diagnosis not present

## 2019-06-07 DIAGNOSIS — K648 Other hemorrhoids: Secondary | ICD-10-CM | POA: Diagnosis not present

## 2019-06-07 DIAGNOSIS — E785 Hyperlipidemia, unspecified: Secondary | ICD-10-CM | POA: Diagnosis not present

## 2019-06-07 DIAGNOSIS — Z79899 Other long term (current) drug therapy: Secondary | ICD-10-CM | POA: Diagnosis not present

## 2019-06-07 HISTORY — PX: POLYPECTOMY: SHX5525

## 2019-06-07 HISTORY — PX: COLONOSCOPY: SHX5424

## 2019-06-07 SURGERY — COLONOSCOPY
Anesthesia: Moderate Sedation

## 2019-06-07 MED ORDER — MIDAZOLAM HCL 5 MG/5ML IJ SOLN
INTRAMUSCULAR | Status: AC
  Filled 2019-06-07: qty 10

## 2019-06-07 MED ORDER — SODIUM CHLORIDE 0.9 % IV SOLN
INTRAVENOUS | Status: DC
  Administered 2019-06-07: 13:00:00 20 mL/h via INTRAVENOUS

## 2019-06-07 MED ORDER — MEPERIDINE HCL 100 MG/ML IJ SOLN
INTRAMUSCULAR | Status: DC | PRN
  Administered 2019-06-07 (×2): 25 mg via INTRAVENOUS

## 2019-06-07 MED ORDER — MIDAZOLAM HCL 5 MG/5ML IJ SOLN
INTRAMUSCULAR | Status: DC | PRN
  Administered 2019-06-07: 1 mg via INTRAVENOUS
  Administered 2019-06-07 (×2): 2 mg via INTRAVENOUS

## 2019-06-07 MED ORDER — STERILE WATER FOR IRRIGATION IR SOLN
Status: DC | PRN
  Administered 2019-06-07: 1.5 mL

## 2019-06-07 MED ORDER — MEPERIDINE HCL 100 MG/ML IJ SOLN
INTRAMUSCULAR | Status: AC
  Filled 2019-06-07: qty 2

## 2019-06-07 NOTE — Discharge Instructions (Signed)
Te quitaron 2 plipos pequeos. Tiene pequeas hemorroides internas y diverticulosis EN SU COLON [place].  BEBA AGUA PARA MANTENER LA [place].  SIGUE UNA DIETA ALTA EN FIBRA. EVITE LOS ELEMENTOS QUE CAUSAN HINCHAZN. CONSULTE LA SIGUIENTE INFORMACIN.  LOS RESULTADOS DE LA BIOPSIA DEBEN VOLVER EN 5 DAS HBILES.  Prxima colonoscopia en 5-10 aos.    You had 2 small polyps removed. You have small internal hemorrhoids and diverticulosis IN YOUR RIGHT AND LEFT COLON.   DRINK WATER TO KEEP YOUR URINE LIGHT YELLOW.  FOLLOW A HIGH FIBER DIET. AVOID ITEMS THAT CAUSE BLOATING. SEE INFO BELOW.  YOUR BIOPSY RESULTS SHOULD BE BACK IN 5 business DAYS.  Next colonoscopy in 5-10 years.    Colonoscopa: cuidados posteriores (Colonoscopy, Care After) Siga estas instrucciones durante las prximas semanas. Estas indicaciones le proporcionan informacin general acerca de cmo deber cuidarse despus del procedimiento. El mdico tambin podr darle instrucciones ms especficas. El tratamiento ha sido planificado segn las prcticas mdicas actuales, pero en algunos casos pueden ocurrir problemas. Comunquese con el mdico si tiene algn problema o tiene dudas despus del procedimiento. QU ESPERAR DESPUS DEL PROCEDIMIENTO  Despus del procedimiento, es comn tener las siguientes sensaciones:  Una pequea cantidad de sangre en la materia fecal.  Cantidades moderadas de gases e hinchazn o calambres abdominales leves. INSTRUCCIONES PARA EL CUIDADO EN EL HOGAR  No conduzca vehculos, opere maquinarias ni firme documentos importantes durante 24horas.  Puede ducharse y retomar sus actividades fsicas habituales, pero muvase a un ritmo ms lento durante las primeras 24horas.  Tmese descansos frecuentes durante las primeras 24horas.  Camine o colquese compresas calientes en el abdomen para ayudar a reducir los calambres e hinchazn abdominales.  Beba  suficiente lquido para Consulting civil engineer orina clara o de color amarillo plido.  Puede retomar su dieta normal segn las instrucciones de su mdico. Evite los alimentos pesados o fritos que son difciles de Publishing copy.  Evite consumir alcohol durante 24horas o segn las instrucciones de su mdico.  Tome solo medicamentos de venta libre o recetados, segn las indicaciones del mdico.  Si se obtuvo una muestra de tejido (biopsia) durante el procedimiento:  No tome aspirina ni anticoagulantes durante 7das, o segn las instrucciones de su mdico.  No consuma alcohol durante 7das o segn las instrucciones de su mdico.  Consuma alimentos livianos durante las primeras 24horas. SOLICITE ATENCIN MDICA SI: Tiene manchas persistentes de sangre en la materia fecal entre 2 y 3das posteriores al procedimiento. SOLICITE ATENCIN MDICA DE INMEDIATO SI:  Tiene ms que una pequea mancha de sangre en la materia fecal.  Elimina grandes cogulos de sangre en la materia fecal.  Tiene el abdomen hinchado (distendido).  Tiene nuseas o vmitos.  Tiene fiebre.  Siente dolor intenso en el abdomen que no se alivia con los [employer].  Plipos en el colon  (Colon Polyps) Los plipos son masas de tejido que crecen dentro del cuerpo. Los plipos pueden desarrollarse en el intestino grueso (colon). La mayora de los plipos son no cancerosos (benignos). Sin embargo, algunos plipos pueden convertirse en cancerosos con el tiempo. Los plipos que sean ms grandes que un guisante pueden ser [employer]. Para estar seguros, los mdicos extirpan y [employer] plipos.  CAUSAS  Se forman cuando ciertas mutaciones genticas hacen que las clulas se desarrollen y se dividan por dems.  FACTORES DE RIESGO  Hay un nmero de factores de riesgo que pueden aumentar las probabilidades de padecer plipos en el colon.  Ellos son:   Ser mayor de 57 aos de edad.  Historia familiar de cncer o plipos de  colon.  Ciertas enfermedades crnicas como la colitis o la enfermedad de Crohn.  Tener sobrepeso.  El hbito de fumar.  El sedentarismo.  Beber alcohol en exceso. SNTOMAS  La mayor parte de los plipos no causa sntomas. Si se presentan sntomas, stos pueden ser:   [employer] materia fecal. Heces de color rojo oscuro o negro.  Constipacin o diarrea que duran ms de 1 semana. DIAGNSTICO  [place] persona con frecuencia no sabe que tiene plipos [employer] su mdico los halla durante un examen fsico de [place]. El mdico puede usar 4 tipos de pruebas para Hydrographic surveyor plipos:   Examen Musician. Se colocar guantes y palpar el interior del recto. Esta prueba detectar solo plipos en el recto.  Enema de bario. El mdico introduce un lquido llamado bario en el recto y luego toma radiografas del colon. El bario hace que el colon se vea blanco. Los plipos son de color oscuro, por lo que son fciles de Chiropodist.  Sigmoideoscopia. Se coloca un tubo delgado y flexible (sigmoideoscopio) en el recto. Este sigmoideoscopio tiene [place] fuente de luz y Ardelia Mems pequea cmara de video. El mdico [place] el sigmoideoscopio para observar el ltimo tercio del colon.  Colonoscopa. Esta prueba es similar a la sigmoideoscopia, pero el mdico examina todo el colon. Este es el mtodo ms frecuente para Hydrographic surveyor y extirpar los plipos.  PREVENCIN  Para disminuir los riesgos de volver a Best boy plipos en el colon:   Coma mucha fruta y [place]. Evite las comidas grasas.  No fume.  Evite consumir alcohol.  [place].  Baje de peso segn las indicaciones de su mdico.  Consuma mucho clcio y folato. Las comidas que contienen calcio son la [place], los quesos y el brcoli. Las comidas que contienen folato son los garbanzos, los frijoles rojos y Nurse, mental health.  Dieta rica en fibra  (High-Fiber Diet)  La fibra, tambin llamada fibra dietaria, es un tipo de  carbohidrato que se encuentra en las frutas, las verduras, los cereales integrales y los frijoles. Una dieta rica en fibra puede tener muchos beneficios para la salud. El mdico puede recomendar una dieta rica en fibra para ayudar a:  Contractor. La fibra puede hacer que defeque con ms frecuencia.  Disminuir el nivel de colesterol.  [place] hemorroides, la diverticulosis no complicada o el sndrome del intestino irritable.  Evitar comer en exceso como parte de un plan para bajar de peso.  Evitar cardiopatas, la diabetes tipo 2 y ciertos cnceres. EN QU CONSISTE EL PLAN?  El consumo diario recomendado de fibra incluye lo siguiente:  15 gramos para hombres menores de 17 aos.  30 gramos para hombres mayores de 50 aos.  25 gramos para mujeres menores de 50 aos.  21 gramos para mujeres mayores de 50 aos. Puede lograr el consumo diario recomendado de fibra si come una variedad de frutas, verduras, cereales y frijoles. El mdico tambin puede recomendar un suplemento de fibra si no es posible obtener suficiente fibra a travs de la dieta.  QU DEBO SABER ACERCA DE [place]?  La eficacia de los suplementos de [place] no ha sido estudiada [place], de modo que es mejor obtener fibra a travs de los alimentos.  Verifique siempre el contenido de fibra en la etiqueta de informacin nutricional de los alimentos preenvasados.  Busque alimentos que contengan al menos 5 gramos de fibra por porcin.  Consulte al nutricionista si tiene preguntas sobre algunos alimentos especficos relacionados con su enfermedad, especialmente si estos alimentos no se mencionan a continuacin.  Aumente el consumo diario de fibra en forma gradual. Aumentar demasiado rpido el consumo de fibra dietaria puede provocar meteorismo, clicos o gases.  Beber abundante agua. El [place] a Economist. QU ALIMENTOS PUEDO COMER?  Cereales  Panes integrales. Multicereales. Avena. Arroz  integral. Dwyane Luo. Trigo burgol. Mijo. Muffins de salvado. Palomitas de maz. Galletas de centeno.  Verduras  Batatas. [place]. Col rizada. [place]as. Repollo. Brcoli. Guisantes. Zan[place]orias. [place].  Frutas  Frutos rojos. Peras. Manzanas. [place]aran[place]. Ciruelas y pasas. Higos secos.  Carnes y otras fuentes de protenas  Frijoles blancos, colorados, pintos y porotos de soja. Guisantes secos. Lentejas. Frutos secos y semillas.  Lcteos  Yogur fortificado con Pharmacist, hospital.  Bebidas  Leche de soja fortificada con Fredderick Phenix. Jugo de naranja fortificado con Fredderick Phenix.  Otros  [place].  Los artculos mencionados arriba pueden no ser [employer] de las bebidas o los alimentos recomendados. Comunquese con el nutricionista para conocer ms opciones.  QU ALIMENTOS NO SE RECOMIENDAN?  Cereales  Pan blanco. Pastas hechas con Letitia Neri. Arroz blanco.  Verduras  Papas fritas. Verduras enlatadas. Verduras bien cocidas.  Frutas  Jugo de frutas. Frutas cocidas coladas.  Carnes y otras fuentes de protenas  Cortes de carne con Lobbyist. Aves o pescados fritos.  Lcteos  Leche. Yogur. Queso crema. [employer].  Bebidas  Gaseosas.  Otros  Tortas y pasteles. Sam Rayburn y aceites.  Los artculos mencionados arriba pueden no ser [employer] de las bebidas y los alimentos que se Higher education careers adviser. Comunquese con el nutricionista para obtener ms informacin.  ALGUNOS CONSEJOS PARA INCLUIR ALIMENTOS RICOS EN FIBRA EN LA DIETA  Consuma una gran variedad de alimentos ricos en fibra.  Asegrese de que la mitad de todos los cereales consumidos cada da sean cereales integrales.  Reemplace los panes y cereales hechos de harina refinada o harina blanca por panes y cereales integrales.  Reemplace el arroz blanco por arroz integral, trigo burgol o mijo.  Comience Games developer con un desayuno rico en [place], como un cereal que contenga al menos 5 gramos de fibra por porcin.  Use guisantes en lugar de  carne en las sopas, ensaladas o pastas.  Coma bocadillos ricos en fibra, como frutos rojos, verduras crudas, frutos secos o palomitas de maz.   Diverticulosis  (Diverticulosis)   La diverticulosis es una enfermedad que aparece cuando se forman pequeos bolsillos (divertculos) en las paredes del colon. El colon, o intestino grueso, es el lugar donde se absorbe agua y se forman las heces. Los bolsillos se forman cuando la capa interna del colon ejerce presin sobre los puntos dbiles de las capas externas.  CAUSAS  Nadie sabe con exactitud qu causa la diverticulosis.  FACTORES DE RIESGO  Ser mayor de 58 aos. El riesgo de desarrollar esta enfermedad aumenta con la edad. La diverticulosis es poco frecuente en las personas menores de 53 aos. A los 80 aos, casi todas las personas tienen la enfermedad.  Comer una dieta con bajo contenido de [place].  Estar estreido con frecuencia.  Tener sobrepeso.  No hacer suficiente ejercicio fsico.  Fumar.  Tomar analgsicos de venta libre, como aspirina e ibuprofeno. SNTOMAS  La mayora de las personas que tienen diverticulosis no presentan sntomas.  DIAGNSTICO  Dado que la diverticulosis  no suele causar sntomas, los mdicos a menudo descubren la enfermedad durante un examen de otros problemas de colon. En muchos casos, el mdico diagnosticar la diverticulosis mientras utiliza un endoscopio flexible para examinar el colon (colonoscopa).  TRATAMIENTO  Si nunca tuvo una infeccin relacionada con la diverticulosis, es posible que no necesite tratamiento. Si ha tenido una infeccin antes, el tratamiento puede incluir:  Comer ms frutas, verduras y cereales.  Tomar un suplemento de [place].  Tomar un suplemento de bacterias vivas (probitico).  Tomar medicamentos para relajar el colon. INSTRUCCIONES PARA EL CUIDADO EN EL HOGAR  Beba por lo menos entre 6 y 49 vasos de agua por da para Engineer, civil (consulting).  Trate de no hacer fuerza al mover el  intestino.  Cumpla con todas las visitas de control. Si ha tenido una infeccin antes:  Aumente la cantidad de fibra en la dieta, segn las indicaciones del mdico o del nutricionista.  Tome un suplemento dietario con fibras si el mdico lo autoriza.  Tome los medicamentos solamente como se lo haya indicado el mdico. SOLICITE ATENCIN MDICA SI:  Siente dolor abdominal.  Tiene meteorismo.  Tiene clicos.  No ha defecado en 3 das. SOLICITE ATENCIN MDICA DE INMEDIATO SI:  El dolor empeora.  El meteorismo [employer].  Tiene fiebre o escalofros, y los sntomas empeoran repentinamente.  Comienza a vomitar.  La materia fecal es sanguinolenta o negra. ASEGRESE DE QUE:  Comprende estas instrucciones.  Controlar su afeccin.  Recibir ayuda de inmediato si no mejora o si empeora.

## 2019-06-07 NOTE — OR Nursing (Signed)
Interpretor Melisa [id number].  For Dr Oneida Alar with instructions/potential complications.

## 2019-06-07 NOTE — Op Note (Signed)
[facility] Patient Name: Tasha [name] Procedure Date: 06/07/2019 12:52 PM MRN: [id number] Date of Birth: Oct 20, 1950 Attending MD: Barney Drain MD, MD CSN: [id number] Age: 69 Admit Type: Outpatient Procedure:                Colonoscopy WITH COLD SNARE POLYPECTOMY Indications:              Screening for colorectal malignant neoplasm Providers:                Barney Drain MD, MD, Otis Peak B. Sharon Seller, RN, Nelma Rothman, Technician Referring MD:             Fransisca Kaufmann. Dettinger Medicines:                Meperidine 50 mg IV, Midazolam 5 mg IV Complications:            No immediate complications. Estimated Blood Loss:     Estimated blood loss was minimal. Procedure:                Pre-Anesthesia Assessment:                           - Prior to the procedure, a History and Physical                            was performed, and patient medications and                            allergies were reviewed. The patient's tolerance of                            previous anesthesia was also reviewed. The risks                            and benefits of the procedure and the sedation                            options and risks were discussed with the patient.                            All questions were answered, and informed consent                            was obtained. Prior Anticoagulants: The patient has                            taken no previous anticoagulant or antiplatelet                            agents except for aspirin. ASA Grade Assessment: II                            - A patient with mild systemic disease. After  reviewing the risks and benefits, the patient was                            deemed in satisfactory condition to undergo the                            procedure. After obtaining informed consent, the                            colonoscope was passed under direct vision.                            Throughout the  procedure, the patient's blood                            pressure, pulse, and oxygen saturations were                            monitored continuously. The [id number]                            scope was introduced through the anus and advanced                            to the the cecum, identified by appendiceal orifice                            and ileocecal valve. The ileocecal valve,                            appendiceal orifice, and rectum were photographed.                            The colonoscopy was technically difficult and                            complex due to a tortuous colon. Successful                            completion of the procedure was aided by                            straightening and shortening the scope to obtain                            bowel loop reduction and COLOWRAP. The patient                            tolerated the procedure fairly well. The quality of                            the bowel preparation was excellent. Scope In: 1:20:56 PM Scope Out: 1:37:02 PM Scope Withdrawal Time: 0 hours 12 minutes 30 seconds  Total Procedure Duration:  0 hours 16 minutes 6 seconds  Findings:      Two sessile polyps were found in the proximal transverse colon and       distal transverse colon. The polyps were 4 to 5 mm in size. These polyps       were removed with a cold snare. Resection and retrieval were complete.      Multiple small and large-mouthed diverticula were found in the entire       colon.      Internal hemorrhoids were found. The hemorrhoids were small.      The recto-sigmoid colon, sigmoid colon and descending colon were       moderately tortuous. Impression:               - Two 4 to 5 mm polyps in the proximal transverse                            colon and in the distal transverse colon, removed                            with a cold snare. Resected and retrieved.                           - Diverticulosis in the entire examined  colon.                           - Internal hemorrhoids.                           - Tortuous LEFT colon. Moderate Sedation:      Moderate (conscious) sedation was administered by the endoscopy nurse       and supervised by the endoscopist. The following parameters were       monitored: oxygen saturation, heart rate, blood pressure, and response       to care. Total physician intraservice time was 25 minutes. Recommendation:           - Patient has a contact number available for                            emergencies. The signs and symptoms of potential                            delayed complications were discussed with the                            patient. Return to normal activities tomorrow.                            Written discharge instructions were provided to the                            patient.                           - High fiber diet.                           -  Continue present medications.                           - Await pathology results.                           - Repeat colonoscopy in 5-10 years for surveillance. Procedure Code(s):        --- Professional ---                           [phone], Colonoscopy, flexible; with removal of                            tumor(s), polyp(s), or other lesion(s) by snare                            technique                           99153, Moderate sedation; each additional 15                            minutes intraservice time                           G0500, Moderate sedation services provided by the                            same physician or other qualified health care                            professional performing a gastrointestinal                            endoscopic service that sedation supports,                            requiring the presence of an independent trained                            observer to assist in the monitoring of the                            patient's level of consciousness and  physiological                            status; initial 15 minutes of intra-service time;                            patient age 65 years or older (additional time may                            be reported with [phone], as appropriate) Diagnosis Code(s):        --- Professional ---  Z12.11, Encounter for screening for malignant                            neoplasm of colon                           K63.5, Polyp of colon                           K64.8, Other hemorrhoids                           K57.30, Diverticulosis of large intestine without                            perforation or abscess without bleeding                           Q43.8, Other specified congenital malformations of                            intestine CPT copyright 2019 American Medical Association. All rights reserved. The codes documented in this report are preliminary and upon coder review may  be revised to meet current compliance requirements. Barney Drain, MD Barney Drain MD, MD 06/07/2019 1:50:26 PM This report has been signed electronically. Number of Addenda: 0

## 2019-06-07 NOTE — H&P (Signed)
Primary Care Physician:  Dettinger, Fransisca Kaufmann, MD Primary Gastroenterologist:  Dr. Oneida Alar  Pre-Procedure History & Physical: HPI:  Tasha [name] is a 69 y.o. female here for [place]. Interpreter: [id number]. SPANISH SPEAKING ONLY-HISTORY OBTAINED VIA INTERPRETER     .  Past Medical History:  Diagnosis Date  . Acute respiratory failure ([place])   . CAP (community acquired pneumonia)   . Hyperlipidemia   . Hypertension   . Metabolic syndrome     Past Surgical History:  Procedure Laterality Date  . CESAREAN SECTION      Prior to Admission medications   Medication Sig Start Date End Date Taking? Authorizing Provider  amLODipine (NORVASC) 5 MG tablet Take 1 tablet (5 mg total) by mouth at bedtime. 08/27/18  Yes Dettinger, Fransisca Kaufmann, MD  aspirin EC 81 MG tablet Take 1 tablet (81 mg total) by mouth daily. 02/27/18  Yes Herminio Commons, MD  atorvastatin (LIPITOR) 20 MG tablet Take 1 tablet (20 mg total) by mouth daily at 6 PM. 08/27/18  Yes Dettinger, Fransisca Kaufmann, MD  Carboxymethylcellul-Glycerin (LUBRICATING EYE DROPS OP) Place 1 drop into both eyes daily as needed (dry eyes).   Yes [provider]  cetirizine (ZYRTEC) 10 MG tablet Take 1 tablet (10 mg total) by mouth daily as needed for allergies. 08/27/18  Yes Dettinger, Fransisca Kaufmann, MD  Efinaconazole 10 % SOLN Apply 1 application topically daily. Patient taking differently: Apply 1 application topically daily as needed (skin bumps).  11/25/18 10/27/19 Yes Rakes, Connye Burkitt, FNP  hydrochlorothiazide (HYDRODIURIL) 25 MG tablet Take 1 tablet (25 mg total) by mouth daily. 04/14/19  Yes Dettinger, Fransisca Kaufmann, MD  Ipratropium-Albuterol (COMBIVENT RESPIMAT) 20-100 MCG/ACT AERS respimat Inhale 1 puff into the lungs every 6 (six) hours as needed for wheezing. 12/09/18  Yes Rakes, Connye Burkitt, FNP  levothyroxine (SYNTHROID) 50 MCG tablet Take 1 tablet (50 mcg total) by mouth daily before breakfast. 08/27/18  Yes Dettinger, Fransisca Kaufmann, MD  losartan  (COZAAR) 25 MG tablet Take 1 tablet (25 mg total) by mouth daily. 01/13/19  Yes Dettinger, Fransisca Kaufmann, MD  Omega 3 1000 MG CAPS Take 1,000 mg by mouth daily.    Yes [provider]  pantoprazole (PROTONIX) 40 MG tablet Take 1 tablet (40 mg total) by mouth daily. 04/14/19  Yes Dettinger, Fransisca Kaufmann, MD  VITAMIN D PO Take 1 capsule by mouth daily.   Yes [provider]  ibuprofen (ADVIL) 200 MG tablet Take 400 mg by mouth every 6 (six) hours as needed for headache or moderate pain.    [provider]    Allergies as of 02/25/2019 - Review Complete 02/24/2019  Allergen Reaction Noted  . Homatropine  12/16/2017  . Hydromet [hydrocodone-homatropine]  03/26/2013  . Amoxicillin Rash 09/03/2012    Family History  Problem Relation Age of Onset  . Cancer Father   . Healthy Brother   . Healthy Daughter   . Healthy Sister   . Memory loss Sister   . Healthy Son   . Healthy Son     Social History   Socioeconomic History  . Marital status: Married    Spouse name: Ramiro  . Number of children: 3  . Years of education: 6  . Highest education level: 6th grade  Occupational History  . Occupation: retired  Tobacco Use  . Smoking status: Former Research scientist (life sciences)  . Smokeless tobacco: Never Used  Substance and Sexual Activity  . Alcohol use: No  . Drug use: No  .  Sexual activity: Not Currently    Birth control/protection: Post-menopausal  Other Topics Concern  . Not on file  Social History Narrative  . Not on file   Social Determinants of Health   Financial Resource Strain:   . Difficulty of Paying Living Expenses: Not on file  Food Insecurity:   . Worried About Charity fundraiser in the Last Year: Not on file  . Ran Out of Food in the Last Year: Not on file  Transportation Needs:   . Lack of Transportation (Medical): Not on file  . Lack of Transportation (Non-Medical): Not on file  Physical Activity:   . Days of Exercise per Week: Not on file  . Minutes of Exercise  per Session: Not on file  Stress: No Stress Concern Present  . Feeling of Stress : Not at all  Social Connections: Unknown  . Frequency of Communication with Friends and Family: Twice a week  . Frequency of Social Gatherings with Friends and Family: Once a week  . Attends Religious Services: Not on file  . Active Member of Clubs or Organizations: Not on file  . Attends Archivist Meetings: Not on file  . Marital Status: Not on file  Intimate Partner Violence:   . Fear of Current or Ex-Partner: Not on file  . Emotionally Abused: Not on file  . Physically Abused: Not on file  . Sexually Abused: Not on file    Review of Systems: See HPI, otherwise negative ROS   Physical Exam: BP (!) 148/71   Pulse 98   Temp 98 F (36.7 C) (Oral)   Resp 20   Ht 5\' 1"  (1.549 m)   Wt 65.8 kg Comment: 145  SpO2 100%   BMI 27.40 kg/m  General:   Alert,  pleasant and cooperative in NAD Head:  Normocephalic and atraumatic. Neck:  Supple; Lungs:  Clear throughout to auscultation.    Heart:  Regular rate and rhythm. Abdomen:  Soft, nontender and nondistended. Normal bowel sounds, without guarding, and without rebound.   Neurologic:  Alert and  oriented x4;  grossly normal neurologically.  Impression/Plan:    SCREENING  Plan:  1. TCS TODAY DISCUSSED PROCEDURE, BENEFITS, & RISKS: < 1% chance of medication reaction, bleeding, perforation, ASPIRATION, or rupture of spleen/liver requiring surgery to fix it and missed polyps < 1 cm 10-20% of the time.

## 2019-06-09 LAB — SURGICAL PATHOLOGY

## 2019-06-18 DIAGNOSIS — Z23 Encounter for immunization: Secondary | ICD-10-CM | POA: Diagnosis not present

## 2019-07-14 ENCOUNTER — Encounter: Payer: Self-pay | Admitting: Family Medicine

## 2019-07-14 ENCOUNTER — Other Ambulatory Visit: Payer: Self-pay

## 2019-07-14 ENCOUNTER — Ambulatory Visit (INDEPENDENT_AMBULATORY_CARE_PROVIDER_SITE_OTHER): Payer: Medicare Other | Admitting: Family Medicine

## 2019-07-14 VITALS — BP 132/79 | HR 68 | Temp 98.6°F | Ht 61.0 in | Wt 144.5 lb

## 2019-07-14 DIAGNOSIS — I10 Essential (primary) hypertension: Secondary | ICD-10-CM

## 2019-07-14 DIAGNOSIS — I208 Other forms of angina pectoris: Secondary | ICD-10-CM | POA: Diagnosis not present

## 2019-07-14 DIAGNOSIS — E039 Hypothyroidism, unspecified: Secondary | ICD-10-CM

## 2019-07-14 DIAGNOSIS — K219 Gastro-esophageal reflux disease without esophagitis: Secondary | ICD-10-CM

## 2019-07-14 DIAGNOSIS — R7303 Prediabetes: Secondary | ICD-10-CM

## 2019-07-14 DIAGNOSIS — E785 Hyperlipidemia, unspecified: Secondary | ICD-10-CM

## 2019-07-14 LAB — BAYER DCA HB A1C WAIVED: HB A1C (BAYER DCA - WAIVED): 5.3 % (ref <7.0)

## 2019-07-14 MED ORDER — ATORVASTATIN CALCIUM 20 MG PO TABS: 20.0000 mg | ORAL_TABLET | Freq: Every day | ORAL | 3 refills | Status: DC

## 2019-07-14 MED ORDER — LOSARTAN POTASSIUM 25 MG PO TABS: 25.0000 mg | ORAL_TABLET | Freq: Every day | ORAL | 3 refills | Status: DC

## 2019-07-14 MED ORDER — AMLODIPINE BESYLATE 5 MG PO TABS: 5.0000 mg | ORAL_TABLET | Freq: Every day | ORAL | 3 refills | Status: DC

## 2019-07-14 MED ORDER — FLUTICASONE PROPIONATE 50 MCG/ACT NA SUSP: 1.0000 | Freq: Two times a day (BID) | NASAL | 6 refills | Status: DC | PRN

## 2019-07-14 MED ORDER — HYDROCHLOROTHIAZIDE 25 MG PO TABS: 25.0000 mg | ORAL_TABLET | Freq: Every day | ORAL | 3 refills | Status: DC

## 2019-07-14 MED ORDER — PANTOPRAZOLE SODIUM 40 MG PO TBEC: 40.0000 mg | DELAYED_RELEASE_TABLET | Freq: Every day | ORAL | 3 refills | Status: DC

## 2019-07-14 MED ORDER — LEVOTHYROXINE SODIUM 50 MCG PO TABS: 50.0000 ug | ORAL_TABLET | Freq: Every day | ORAL | 3 refills | Status: DC

## 2019-07-14 NOTE — Addendum Note (Signed)
Addended by: Caryl Pina on: 07/14/2019 01:12 PM   Modules accepted: Orders

## 2019-07-14 NOTE — Progress Notes (Addendum)
BP 132/79   Pulse 68   Temp 98.6 F (37 C)   Ht 5' 1"  (1.549 m)   Wt 144 lb 8 oz (65.5 kg)   SpO2 98%   BMI 27.30 kg/m    Subjective:   [name], female    DOB: 22-Jan-1951, 69 y.o.   MRN: [id number]  HPI: Tasha [name] is a 69 y.o. female presenting on 07/14/2019 for Medical Management of Chronic Issues (67m and Hypertension   HPI Hypertension Patient is currently on amlodipine and losartan, and their blood pressure today is 132/79. Patient denies any lightheadedness or dizziness. Patient denies headaches, blurred vision, chest pains, shortness of breath, or weakness. Denies any side effects from medication and is content with current medication.   Hypothyroidism recheck Patient is coming in for thyroid recheck today as well. They deny any issues with hair changes or heat or cold problems or diarrhea or constipation. They deny any chest pain or palpitations. They are currently on levothyroxine 50 micrograms   Hyperlipidemia Patient is coming in for recheck of his hyperlipidemia. The patient is currently taking atorvastatin. They deny any issues with myalgias or history of liver damage from it. They deny any focal numbness or weakness or chest pain.   Patient has been complaining of intermittent chest tightness that it comes sometimes when she is resting sometimes when she is doing things, she does not describe it as a chest pain but does not really know how to describe it except that it has been happening every now and then and not that often but it has been happening over the past year and it was not happening the year before.  Relevant past medical, surgical, family and social history reviewed and updated as indicated. Interim medical history since our last visit reviewed. Allergies and medications reviewed and updated.  Review of Systems  Constitutional: Negative for chills and fever.  Eyes: Negative for visual disturbance.  Respiratory: Negative for chest  tightness and shortness of breath.   Cardiovascular: Negative for chest pain and leg swelling.  Musculoskeletal: Negative for back pain and gait problem.  Skin: Negative for rash.  Neurological: Negative for light-headedness and headaches.  Psychiatric/Behavioral: Negative for agitation and behavioral problems.  All other systems reviewed and are negative.   Per HPI unless specifically indicated above   Allergies as of 07/14/2019      Reactions   Hydromet [hydrocodone-homatropine]    Pt doesn't remember   Amoxicillin Rash   Did it involve swelling of the face/tongue/throat, SOB, or low BP? No Did it involve sudden or severe rash/hives, skin peeling, or any reaction on the inside of your mouth or nose? Unknown Did you need to seek medical attention at a hospital or doctor's office? Unknown When did it last happen?10 + years If all above answers are "NO", may proceed with cephalosporin use.      Medication List       Accurate as of July 14, 2019 12:17 PM. If you have any questions, ask your nurse or doctor.        amLODipine 5 MG tablet Commonly known as: NORVASC Take 1 tablet (5 mg total) by mouth at bedtime.   aspirin EC 81 MG tablet Take 1 tablet (81 mg total) by mouth daily.   atorvastatin 20 MG tablet Commonly known as: LIPITOR Take 1 tablet (20 mg total) by mouth daily at 6 PM.   cetirizine 10 MG tablet Commonly known as: ZYRTEC Take 1 tablet (10  mg total) by mouth daily as needed for allergies.   Combivent Respimat 20-100 MCG/ACT Aers respimat Generic drug: Ipratropium-Albuterol Inhale 1 puff into the lungs every 6 (six) hours as needed for wheezing.   Efinaconazole 10 % Soln Apply 1 application topically daily. What changed:   when to take this  reasons to take this   hydrochlorothiazide 25 MG tablet Commonly known as: HYDRODIURIL Take 1 tablet (25 mg total) by mouth daily.   ibuprofen 200 MG tablet Commonly known as: ADVIL Take 400 mg by  mouth every 6 (six) hours as needed for headache or moderate pain.   levothyroxine 50 MCG tablet Commonly known as: SYNTHROID Take 1 tablet (50 mcg total) by mouth daily before breakfast.   losartan 25 MG tablet Commonly known as: Cozaar Take 1 tablet (25 mg total) by mouth daily.   LUBRICATING EYE DROPS OP Place 1 drop into both eyes daily as needed (dry eyes).   Omega 3 1000 MG Caps Take 1,000 mg by mouth daily.   pantoprazole 40 MG tablet Commonly known as: PROTONIX Take 1 tablet (40 mg total) by mouth daily.   VITAMIN D PO Take 1 capsule by mouth daily.        Objective:   BP 132/79   Pulse 68   Temp 98.6 F (37 C)   Ht 5' 1"  (1.549 m)   Wt 144 lb 8 oz (65.5 kg)   SpO2 98%   BMI 27.30 kg/m   Wt Readings from Last 3 Encounters:  07/14/19 144 lb 8 oz (65.5 kg)  06/07/19 145 lb (65.8 kg)  04/14/19 143 lb 9.6 oz (65.1 kg)    Physical Exam Vitals and nursing note reviewed.  Constitutional:      General: She is not in acute distress.    Appearance: She is well-developed. She is not diaphoretic.  Eyes:     Conjunctiva/sclera: Conjunctivae normal.  Cardiovascular:     Rate and Rhythm: Normal rate and regular rhythm.     Heart sounds: Normal heart sounds. No murmur.  Pulmonary:     Effort: Pulmonary effort is normal. No respiratory distress.     Breath sounds: Normal breath sounds. No wheezing.  Musculoskeletal:        General: No tenderness. Normal range of motion.  Skin:    General: Skin is warm and dry.     Findings: No rash.  Neurological:     Mental Status: She is alert and oriented to person, place, and time.     Coordination: Coordination normal.  Psychiatric:        Behavior: Behavior normal.       Assessment & Plan:   Problem List Items Addressed This Visit      Cardiovascular and Mediastinum   Hypertension   Relevant Medications   amLODipine (NORVASC) 5 MG tablet   atorvastatin (LIPITOR) 20 MG tablet   hydrochlorothiazide  (HYDRODIURIL) 25 MG tablet   losartan (COZAAR) 25 MG tablet   Other Relevant Orders   BMP8+EGFR     Digestive   Gastroesophageal reflux disease without esophagitis   Relevant Medications   pantoprazole (PROTONIX) 40 MG tablet     Endocrine   Hypothyroidism   Relevant Medications   levothyroxine (SYNTHROID) 50 MCG tablet   Other Relevant Orders   TSH     Other   Hyperlipidemia with target LDL less than 100   Relevant Medications   amLODipine (NORVASC) 5 MG tablet   atorvastatin (LIPITOR) 20 MG tablet  hydrochlorothiazide (HYDRODIURIL) 25 MG tablet   losartan (COZAAR) 25 MG tablet   Prediabetes - Primary   Relevant Medications   losartan (COZAAR) 25 MG tablet   Other Relevant Orders   Bayer DCA Hb A1c Waived (Completed)    Other Visit Diagnoses    Atypical angina (HCC)       Relevant Medications   amLODipine (NORVASC) 5 MG tablet   atorvastatin (LIPITOR) 20 MG tablet   hydrochlorothiazide (HYDRODIURIL) 25 MG tablet   losartan (COZAAR) 25 MG tablet   Other Relevant Orders   Ambulatory referral to Cardiology      Continue current medications, no changes, doing well Follow up plan: Return in about 3 months (around 10/14/2019), or if symptoms worsen or fail to improve, for Hypertension and prediabetes.  Counseling provided for all of the vaccine components Orders Placed This Encounter  Procedures  . Bayer DCA Hb A1c Waived  . BMP8+EGFR  . TSH    Caryl Pina, MD [facility] 07/14/2019, 12:17 PM

## 2019-07-14 NOTE — Addendum Note (Signed)
Addended by: Caryl Pina on: 07/14/2019 02:18 PM   Modules accepted: Orders

## 2019-07-15 LAB — BMP8+EGFR
BUN/Creatinine Ratio: 19 (ref 12–28)
BUN: 14 mg/dL (ref 8–27)
CO2: 26 mmol/L (ref 20–29)
Calcium: 9.4 mg/dL (ref 8.7–10.3)
Chloride: 102 mmol/L (ref 96–106)
Creatinine, Ser: 0.74 mg/dL (ref 0.57–1.00)
GFR calc Af Amer: 96 mL/min/{1.73_m2} (ref >59)
GFR calc non Af Amer: 83 mL/min/{1.73_m2} (ref >59)
Glucose: 83 mg/dL (ref 65–99)
Potassium: 3.6 mmol/L (ref 3.5–5.2)
Sodium: 141 mmol/L (ref 134–144)

## 2019-07-15 LAB — TSH: TSH: 1.15 u[IU]/mL (ref 0.450–4.500)

## 2019-07-27 NOTE — Progress Notes (Signed)
Cardiology Office Note    Date:  08/02/2019   ID:  [name], Marrow Feb 06, 1951, MRN [id number]  PCP:  Dettinger, Fransisca Kaufmann, MD  Cardiologist: Kate Sable, MD EPS: None  Chief Complaint  Patient presents with  . Follow-up    History of Present Illness:  Tasha [name] is a 69 y.o. female with  History of chest pain with coronary calcification on CT 02/2018,normal NST 02/2018, PAF one occurrence in the setting of pulmonary stress,monitor no Afib, continue ASA unless recurrence..  Last saw Dr. Bronson Ing 05/2018 and BP up so losartan increased.   Patient comes in for f/u accompanied by her daughter in law and an interpreter. Has had her covid 19 vaccine.  She describes a gurgling sensation in her chest that feels like something is stuck. Relieved with laying down. Greasy food or red meat makes it worse.  Has associated reflux. No regular exercise but does her own house work. Sometimes while working she gets short of breath and heart will skip and race and has associated chest tightness and she has to lay down. Usually occurs with mopping and has been going on for 2 years and has not gotten any worse. When she bends over she gets dizzy. No dizziness when she changes position unless she does it quickly.   Past Medical History:  Diagnosis Date  . Acute respiratory failure ([place])   . CAP (community acquired pneumonia)   . Hyperlipidemia   . Hypertension   . Metabolic syndrome     Past Surgical History:  Procedure Laterality Date  . CESAREAN SECTION    . COLONOSCOPY N/A 06/07/2019   Procedure: COLONOSCOPY;  Surgeon: Danie Binder, MD;  [facility]: [facility];  Service: Endoscopy;  Laterality: N/A;  12:45  . POLYPECTOMY  06/07/2019   Procedure: POLYPECTOMY;  Surgeon: Danie Binder, MD;  Location: [facility];  Service: Endoscopy;;    Current Medications: Current Meds  Medication Sig  . amLODipine (NORVASC) 5 MG tablet Take 1 tablet (5 mg total) by mouth at bedtime.    Marland Kitchen aspirin EC 81 MG tablet Take 1 tablet (81 mg total) by mouth daily.  Marland Kitchen atorvastatin (LIPITOR) 20 MG tablet Take 1 tablet (20 mg total) by mouth daily at 6 PM.  . Carboxymethylcellul-Glycerin (LUBRICATING EYE DROPS OP) Place 1 drop into both eyes daily as needed (dry eyes).  . cetirizine (ZYRTEC) 10 MG tablet Take 1 tablet (10 mg total) by mouth daily as needed for allergies.  . Efinaconazole 10 % SOLN Apply 1 application topically daily. (Patient taking differently: Apply 1 application topically daily as needed (skin bumps). )  . fluticasone (FLONASE) 50 MCG/ACT nasal spray Place 1 spray into both nostrils 2 (two) times daily as needed for allergies or rhinitis.  . hydrochlorothiazide (HYDRODIURIL) 25 MG tablet Take 1 tablet (25 mg total) by mouth daily.  Marland Kitchen ibuprofen (ADVIL) 200 MG tablet Take 400 mg by mouth every 6 (six) hours as needed for headache or moderate pain.  . Ipratropium-Albuterol (COMBIVENT RESPIMAT) 20-100 MCG/ACT AERS respimat Inhale 1 puff into the lungs every 6 (six) hours as needed for wheezing.  Marland Kitchen levothyroxine (SYNTHROID) 50 MCG tablet Take 1 tablet (50 mcg total) by mouth daily before breakfast.  . losartan (COZAAR) 25 MG tablet Take 1 tablet (25 mg total) by mouth daily.  . Omega 3 1000 MG CAPS Take 1,000 mg by mouth daily.   . pantoprazole (PROTONIX) 40 MG tablet Take 1 tablet (40 mg total) by mouth  daily.  Marland Kitchen VITAMIN D PO Take 1 capsule by mouth daily.     Allergies:   Hydromet [hydrocodone-homatropine] and Amoxicillin   Social History   Socioeconomic History  . Marital status: Married    Spouse name: Ramiro  . Number of children: 3  . Years of education: 6  . Highest education level: 6th grade  Occupational History  . Occupation: retired  Tobacco Use  . Smoking status: Former Research scientist (life sciences)  . Smokeless tobacco: Never Used  Substance and Sexual Activity  . Alcohol use: No  . Drug use: No  . Sexual activity: Not Currently    Birth control/protection:  Post-menopausal  Other Topics Concern  . Not on file  Social History Narrative  . Not on file   Social Determinants of Health   Financial Resource Strain:   . Difficulty of Paying Living Expenses:   Food Insecurity:   . Worried About Charity fundraiser in the Last Year:   . Arboriculturist in the Last Year:   Transportation Needs:   . Film/video editor (Medical):   Marland Kitchen Lack of Transportation (Non-Medical):   Physical Activity:   . Days of Exercise per Week:   . Minutes of Exercise per Session:   Stress: No Stress Concern Present  . Feeling of Stress : Not at all  Social Connections: Unknown  . Frequency of Communication with Friends and Family: Twice a week  . Frequency of Social Gatherings with Friends and Family: Once a week  . Attends Religious Services: Not on file  . Active Member of Clubs or Organizations: Not on file  . Attends Archivist Meetings: Not on file  . Marital Status: Not on file     Family History:  The patient's family history includes Cancer in her father; Healthy in her brother, daughter, sister, son, and son; Memory loss in her sister.   ROS:   Please see the history of present illness.    ROS All other systems reviewed and are negative.   PHYSICAL EXAM:   VS:  BP 130/76 (BP Location: Left Arm)   Pulse 86   Ht 5' (1.524 m)   Wt 147 lb (66.7 kg)   SpO2 95%   BMI 28.71 kg/m   Physical Exam  GEN: Well nourished, well developed, in no acute distress  Neck: no JVD, carotid bruits, or masses Cardiac:RRR; no murmurs, rubs, or gallops  Respiratory:  clear to auscultation bilaterally, normal work of breathing GI: soft, nontender, nondistended, + BS Ext: without cyanosis, clubbing, or edema, Good distal pulses bilaterally Neuro:  Alert and Oriented x 3 Psych: euthymic mood, full affect  Wt Readings from Last 3 Encounters:  08/02/19 147 lb (66.7 kg)  07/14/19 144 lb 8 oz (65.5 kg)  06/07/19 145 lb (65.8 kg)      Studies/Labs  Reviewed:   EKG:  EKG is  ordered today.  The ekg reviewed from 11/2018 normal sinus rhythm, normal EKG Recent Labs: 04/14/2019: ALT 30; Hemoglobin 12.9; Platelets 150 07/14/2019: BUN 14; Creatinine, Ser 0.74; Potassium 3.6; Sodium 141; TSH 1.150   Lipid Panel    Component Value Date/Time   CHOL 116 04/14/2019 0913   CHOL 180 09/23/2012 1335   TRIG 101 04/14/2019 0913   TRIG 108 08/10/2014 0852   TRIG 135 09/23/2012 1335   HDL 37 (L) 04/14/2019 0913   HDL 40 08/10/2014 0852   HDL 37 (L) 09/23/2012 1335   CHOLHDL 3.1 04/14/2019 0913   LDLCALC  60 04/14/2019 0913   LDLCALC 118 (H) 05/04/2013 1528   LDLCALC 116 (H) 09/23/2012 1335   LDLDIRECT 77 07/11/2016 1536    Additional studies/ records that were reviewed today include:  CT 11/2018 IMPRESSION: 1. There is a mild pattern of pulmonary fibrosis with an apical to basal gradient featuring mild tubular bronchiectasis, irregular peripheral interstitial opacity with some superimposed ground-glass, and some evidence of bronchiolectasis in the lung bases. There is no overt honeycombing. No significant air trapping on expiratory phase imaging. Findings are not significantly changed compared to prior examination and consistent with an "indeterminate for UIP" pattern fibrosis by ATS pulmonary fibrosis criteria.   2.  Aortic atherosclerosis and coronary artery disease.     Electronically Signed   By: Eddie Candle M.D.   On: 12/10/2018 12:13   NST 03/09/18  There was no ST segment deviation noted during stress.  The study is normal. There are no perfusion defects consistent with prior infarct or current ischemia.  This is a low risk study.  The left ventricular ejection fraction is normal (55-65%).   Echo 11/2017 Study Conclusions   - Left ventricle: The cavity size was normal. Systolic function was    vigorous. The estimated ejection fraction was in the range of 65%    to 70%. Wall motion was normal; there were no regional  wall    motion abnormalities. Doppler parameters are consistent with    abnormal left ventricular relaxation (grade 1 diastolic    dysfunction). There was no evidence of elevated ventricular    filling pressure by Doppler parameters.  - Aortic valve: Valve area (VTI): 1.43 cm^2. Valve area (Vmax):    1.67 cm^2. Valve area (Vmean): 1.63 cm^2.  - Aortic root: The aortic root was normal in size.  - Mitral valve: There was mild regurgitation.  - Left atrium: The atrium was normal in size.  - Right ventricle: Systolic function was normal.  - Tricuspid valve: There was severe regurgitation.  - Pulmonary arteries: Systolic pressure was moderately increased.    PA peak pressure: 54 mm Hg (S).  - Inferior vena cava: The vessel was normal in size.  - Pericardium, extracardiac: There was no pericardial effusion.    ASSESSMENT:    1. Coronary artery disease involving native coronary artery of native heart without angina pectoris   2. Essential hypertension   3. Valvular heart disease   4. Paroxysmal atrial fibrillation (HCC)   5. Palpitations   6. Hyperlipidemia, unspecified hyperlipidemia type      PLAN:  In order of problems listed above:  Coronary artery atherosclerosis/chest pain: Nuclear stress test was normal on 03/09/2018. Coronary atherosclerosis noted on chest CT as detailed above.  Continue aspirin.and atorvastatin 20 mg. Is having some chest pain, shortness of breath with palpitations when mopping.  We will add low-dose Toprol-XL 25 mg 1/2 tablet daily.  Place ZIO monitor to make sure she is not having A. fib.  If this is normal proceed with Lexiscan.  Follow-up with me in 4 weeks.   Hypertension: Blood pressure is well controlled.  Patient sometimes gets dizzy when she leans over.  Orthostatics were checked today and she is not orthostatic.  Valvular heart disease: Mild mitral and severe tricuspid regurgitation.  Severe tricuspid regurgitation with moderately elevated pulmonary  pressures likely secondary to interstitial lung disease.  Minimal murmur on exam    Paroxysmal atrial fibrillation: She was on carvedilol but not currently on her list and not sure why.   Event  monitor results reviewed above with no evidence of recurrent atrial fibrillation.  Left atrium normal in size by echocardiogram.    Continue aspirin.    She is having a lot of palpitations when she is doing her housework and heart racing.  Will place a 2-week ZIO monitor to rule out recurrent atrial fibrillation.  Also starting low-dose Toprol.  If she does have demonstrated recurrence, she will need to be on systemic anticoagulation. TSH normal 05/2019     Hyperlipidemia: Lipid panel reviewed with LDL of 60 on 03/2019. On  atorvastatin 20 mg given coronary artery atherosclerosis noted on high-resolution CT scan.         Medication Adjustments/Labs and Tests Ordered: Current medicines are reviewed at length with the patient today.  Concerns regarding medicines are outlined above.  Medication changes, Labs and Tests ordered today are listed in the Patient Instructions below. Patient Instructions  Medication Instructions:  START TOPROL XL 12.5 MG (1/2 TABLET) ONCE DAILY   Labwork: NONE  Testing/Procedures: Your physician has recommended that you wear an event monitor. Event monitors are medical devices that record the heart's electrical activity. Doctors most often [place] these monitors to diagnose arrhythmias. Arrhythmias are problems with the speed or rhythm of the heartbeat. The monitor is a small, portable device. You can wear one while you do your normal daily activities. This is usually used to diagnose what is causing palpitations/syncope (passing out).  24 DAYS- MAIL BACK 08/16/19   Follow-Up: Your physician recommends that you schedule a follow-up appointment in: 4 WEEKS    Any Other Special Instructions Will Be Listed Below (If Applicable).     If you need a refill on your cardiac  medications before your next appointment, please call your pharmacy.      Sumner Boast, PA-C  08/02/2019 12:12 PM    [facility] [address], [place], [place]  [address] Phone: [phone]; Fax: [phone]

## 2019-08-02 ENCOUNTER — Ambulatory Visit (INDEPENDENT_AMBULATORY_CARE_PROVIDER_SITE_OTHER): Payer: Medicare Other | Admitting: Physician Assistant

## 2019-08-02 ENCOUNTER — Encounter: Payer: Self-pay | Admitting: Physician Assistant

## 2019-08-02 ENCOUNTER — Other Ambulatory Visit: Payer: Self-pay

## 2019-08-02 VITALS — BP 130/76 | HR 86 | Ht 60.0 in | Wt 147.0 lb

## 2019-08-02 DIAGNOSIS — I38 Endocarditis, valve unspecified: Secondary | ICD-10-CM | POA: Diagnosis not present

## 2019-08-02 DIAGNOSIS — R002 Palpitations: Secondary | ICD-10-CM | POA: Diagnosis not present

## 2019-08-02 DIAGNOSIS — I10 Essential (primary) hypertension: Secondary | ICD-10-CM

## 2019-08-02 DIAGNOSIS — E785 Hyperlipidemia, unspecified: Secondary | ICD-10-CM

## 2019-08-02 DIAGNOSIS — I480 Paroxysmal atrial fibrillation: Secondary | ICD-10-CM

## 2019-08-02 DIAGNOSIS — I2510 Atherosclerotic heart disease of native coronary artery without angina pectoris: Secondary | ICD-10-CM | POA: Diagnosis not present

## 2019-08-02 DIAGNOSIS — I208 Other forms of angina pectoris: Secondary | ICD-10-CM | POA: Diagnosis not present

## 2019-08-02 MED ORDER — METOPROLOL SUCCINATE ER 25 MG PO TB24: 12.5000 mg | ORAL_TABLET | Freq: Every day | ORAL | 3 refills | Status: DC

## 2019-08-02 NOTE — Patient Instructions (Signed)
Medication Instructions:  START TOPROL XL 12.5 MG (1/2 TABLET) ONCE DAILY   Labwork: NONE  Testing/Procedures: Your physician has recommended that you wear an event monitor. Event monitors are medical devices that record the heart's electrical activity. Doctors most often [place] these monitors to diagnose arrhythmias. Arrhythmias are problems with the speed or rhythm of the heartbeat. The monitor is a small, portable device. You can wear one while you do your normal daily activities. This is usually used to diagnose what is causing palpitations/syncope (passing out).  35 DAYS- MAIL BACK 08/16/19   Follow-Up: Your physician recommends that you schedule a follow-up appointment in: 4 WEEKS    Any Other Special Instructions Will Be Listed Below (If Applicable).     If you need a refill on your cardiac medications before your next appointment, please call your pharmacy.

## 2019-08-23 ENCOUNTER — Ambulatory Visit (INDEPENDENT_AMBULATORY_CARE_PROVIDER_SITE_OTHER): Payer: Medicare Other

## 2019-08-23 ENCOUNTER — Other Ambulatory Visit: Payer: Self-pay

## 2019-08-23 DIAGNOSIS — R002 Palpitations: Secondary | ICD-10-CM

## 2019-08-24 NOTE — Progress Notes (Signed)
Cardiology Office Note    Date:  08/30/2019   ID:  Tasha [name], Tasha [name] [birth year], MRN [id number]  PCP:  Dettinger, Fransisca Kaufmann, MD  Cardiologist: Kate Sable, MD EPS: None  No chief complaint on file.   History of Present Illness:  Tasha [name] is a 69 y.o. female with  History of chest pain with coronary calcification on CT 02/2018,normal NST 02/2018, PAF one occurrence in the setting of pulmonary stress,monitor no Afib, continue ASA unless recurrence..   Last saw Dr. Bronson Ing 05/2018 and BP up so losartan increased.   I saw the patient 08/02/2019 complaining of shortness of breath and palpitations while mopping and some chest pain.  I added low-dose Toprol-XL 25 mg half a tablet daily and placed monitor on her.   That showed heart rate from 53 to 137 bpm rare PACs very rare PVCs brief burst of SVT noted longest 7 beats no sustained arrhythmias or atrial fibrillation.  Patient triggered events did not correlate with arrhythmia.   Patient comes in for f/u accompanied by her daughter in law and an interpreter. Palpitations have improved with toprol. She has developed a rash with itching and swelling on her face and hives that has improved and now she is getting  on her abdomen and legs. Itching all over. Changed detergent after she broke out but no difference in symptoms.   Past Medical History:  Diagnosis Date  . Acute respiratory failure ([place])   . CAP (community acquired pneumonia)   . Hyperlipidemia   . Hypertension   . Metabolic syndrome     Past Surgical History:  Procedure Laterality Date  . CESAREAN SECTION    . COLONOSCOPY N/A 06/07/2019   Procedure: COLONOSCOPY;  Surgeon: Danie Binder, MD;  Location: [facility];  Service: Endoscopy;  Laterality: N/A;  12:45  . POLYPECTOMY  06/07/2019   Procedure: POLYPECTOMY;  Surgeon: Danie Binder, MD;  Location: [facility];  Service: Endoscopy;;    Current Medications: Current Meds  Medication Sig  . amLODipine  (NORVASC) 5 MG tablet Take 1 tablet (5 mg total) by mouth at bedtime.  Marland Kitchen aspirin EC 81 MG tablet Take 1 tablet (81 mg total) by mouth daily.  Marland Kitchen atorvastatin (LIPITOR) 20 MG tablet Take 1 tablet (20 mg total) by mouth daily at 6 PM.  . Carboxymethylcellul-Glycerin (LUBRICATING EYE DROPS OP) Place 1 drop into both eyes daily as needed (dry eyes).  . cetirizine (ZYRTEC) 10 MG tablet Take 1 tablet (10 mg total) by mouth daily as needed for allergies.  . Efinaconazole 10 % SOLN Apply 1 application topically daily. (Patient taking differently: Apply 1 application topically daily as needed (skin bumps). )  . fluticasone (FLONASE) 50 MCG/ACT nasal spray Place 1 spray into both nostrils 2 (two) times daily as needed for allergies or rhinitis.  . hydrochlorothiazide (HYDRODIURIL) 25 MG tablet Take 1 tablet (25 mg total) by mouth daily.  Marland Kitchen ibuprofen (ADVIL) 200 MG tablet Take 400 mg by mouth every 6 (six) hours as needed for headache or moderate pain.  . Ipratropium-Albuterol (COMBIVENT RESPIMAT) 20-100 MCG/ACT AERS respimat Inhale 1 puff into the lungs every 6 (six) hours as needed for wheezing.  Marland Kitchen levothyroxine (SYNTHROID) 50 MCG tablet Take 1 tablet (50 mcg total) by mouth daily before breakfast.  . losartan (COZAAR) 25 MG tablet Take 1 tablet (25 mg total) by mouth daily.  . Omega 3 1000 MG CAPS Take 1,000 mg by mouth daily.   . pantoprazole (PROTONIX) 40  MG tablet Take 1 tablet (40 mg total) by mouth daily.  Marland Kitchen VITAMIN D PO Take 1 capsule by mouth daily.  . [DISCONTINUED] metoprolol succinate (TOPROL XL) 25 MG 24 hr tablet Take 0.5 tablets (12.5 mg total) by mouth daily.     Allergies:   Hydromet [hydrocodone-homatropine], Toprol xl [metoprolol], and Amoxicillin   Social History   Socioeconomic History  . Marital status: Married    Spouse name: Tasha [name]  . Number of children: 3  . Years of education: 6  . Highest education level: 6th grade  Occupational History  . Occupation: retired  Tobacco  Use  . Smoking status: Former Research scientist (life sciences)  . Smokeless tobacco: Never Used  Substance and Sexual Activity  . Alcohol use: No  . Drug use: No  . Sexual activity: Not Currently    Birth control/protection: Post-menopausal  Other Topics Concern  . Not on file  Social History Narrative  . Not on file   Social Determinants of Health   Financial Resource Strain:   . Difficulty of Paying Living Expenses:   Food Insecurity:   . Worried About Charity fundraiser in the Last Year:   . Arboriculturist in the Last Year:   Transportation Needs:   . Film/video editor (Medical):   Marland Kitchen Lack of Transportation (Non-Medical):   Physical Activity:   . Days of Exercise per Week:   . Minutes of Exercise per Session:   Stress: No Stress Concern Present  . Feeling of Stress : Not at all  Social Connections: Unknown  . Frequency of Communication with Friends and Family: Twice a week  . Frequency of Social Gatherings with Friends and Family: Once a week  . Attends Religious Services: Not on file  . Active Member of Clubs or Organizations: Not on file  . Attends Archivist Meetings: Not on file  . Marital Status: Not on file     Family History:  The patient's family history includes Cancer in her father; Healthy in her brother, daughter, sister, son, and son; Memory loss in her sister.   ROS:   Please see the history of present illness.    ROS All other systems reviewed and are negative.   PHYSICAL EXAM:   VS:  BP 136/78   Pulse 88   Temp (!) 97.5 F (36.4 C)   Ht 5\' 2"  (1.575 m)   Wt 147 lb (66.7 kg)   SpO2 98%   BMI 26.89 kg/m   Physical Exam  GEN: Well nourished, well developed, in no acute distress  Neck: no JVD, carotid bruits, or masses Cardiac:RRR; no murmurs, rubs, or gallops  Respiratory:  clear to auscultation bilaterally, normal work of breathing GI: soft, nontender, nondistended, + BS Ext: without cyanosis, clubbing, or edema, Good distal pulses bilaterally Skin:   Rash with some peeling skin on her face, under abdominal flap-yeast like rash, scratch like rash on right upper leg but itching all over legs. Neuro:  Alert and Oriented x 3, Strength and sensation are intact Psych: euthymic mood, full affect  Wt Readings from Last 3 Encounters:  08/30/19 147 lb (66.7 kg)  08/02/19 147 lb (66.7 kg)  07/14/19 144 lb 8 oz (65.5 kg)      Studies/Labs Reviewed:   EKG:  EKG is not ordered today.  Recent Labs: 04/14/2019: ALT 30; Hemoglobin 12.9; Platelets 150 07/14/2019: BUN 14; Creatinine, Ser 0.74; Potassium 3.6; Sodium 141; TSH 1.150   Lipid Panel    Component  Value Date/Time   CHOL 116 04/14/2019 0913   CHOL 180 09/23/2012 1335   TRIG 101 04/14/2019 0913   TRIG 108 08/10/2014 0852   TRIG 135 09/23/2012 1335   HDL 37 (L) 04/14/2019 0913   HDL 40 08/10/2014 0852   HDL 37 (L) 09/23/2012 1335   CHOLHDL 3.1 04/14/2019 0913   LDLCALC 60 04/14/2019 0913   LDLCALC 118 (H) 05/04/2013 1528   LDLCALC 116 (H) 09/23/2012 1335   LDLDIRECT 77 07/11/2016 1536    Additional studies/ records that were reviewed today include:  Holter monitor 08/23/2019  Study Highlights  Patient of Dr. Bronson Ing, seen most recently by Ms. Vita Barley in April and this monitor was requested.  Preventice monitor resulted, 8 days 8 hours 21 minutes analyzed.  Predominant rhythm is sinus with heart rate ranging from 53 bpm up to 137 bpm and average heart rate 78 bpm.  Rare PACs were noted representing less than 1% of total beats.  Very rare PVCs noted.  Brief bursts of SVT noted, the longest of which was 7 beats.  There were no sustained arrhythmias or pauses.  No definite atrial fibrillation.  Patient triggered events did not clearly correlate with arrhythmia.   CT 11/2018 IMPRESSION: 1. There is a mild pattern of pulmonary fibrosis with an apical to basal gradient featuring mild tubular bronchiectasis, irregular peripheral interstitial opacity with some superimposed  ground-glass, and some evidence of bronchiolectasis in the lung bases. There is no overt honeycombing. No significant air trapping on expiratory phase imaging. Findings are not significantly changed compared to prior examination and consistent with an "indeterminate for UIP" pattern fibrosis by ATS pulmonary fibrosis criteria.   2.  Aortic atherosclerosis and coronary artery disease.     Electronically Signed   By: Eddie Candle M.D.   On: 12/10/2018 12:13     NST 03/09/18  There was no ST segment deviation noted during stress.  The study is normal. There are no perfusion defects consistent with prior infarct or current ischemia.  This is a low risk study.  The left ventricular ejection fraction is normal (55-65%).   Echo 11/2017 Study Conclusions   - Left ventricle: The cavity size was normal. Systolic function was    vigorous. The estimated ejection fraction was in the range of 65%    to 70%. Wall motion was normal; there were no regional wall    motion abnormalities. Doppler parameters are consistent with    abnormal left ventricular relaxation (grade 1 diastolic    dysfunction). There was no evidence of elevated ventricular    filling pressure by Doppler parameters.  - Aortic valve: Valve area (VTI): 1.43 cm^2. Valve area (Vmax):    1.67 cm^2. Valve area (Vmean): 1.63 cm^2.  - Aortic root: The aortic root was normal in size.  - Mitral valve: There was mild regurgitation.  - Left atrium: The atrium was normal in size.  - Right ventricle: Systolic function was normal.  - Tricuspid valve: There was severe regurgitation.  - Pulmonary arteries: Systolic pressure was moderately increased.    PA peak pressure: 54 mm Hg (S).  - Inferior vena cava: The vessel was normal in size.  - Pericardium, extracardiac: There was no pericardial effusion.       ASSESSMENT:    1. Coronary artery disease involving native coronary artery of native heart without angina pectoris   2.  Essential hypertension   3. Valvular heart disease   4. Paroxysmal atrial fibrillation (HCC)   5.  Hyperlipidemia, unspecified hyperlipidemia type   6. Hives   7. Facial rash   8. Itching      PLAN:  In order of problems listed above:  Coronary artery atherosclerosis/chest pain: Nuclear stress test was normal on 03/09/2018. Coronary atherosclerosis noted on chest CT as detailed above.  Continue aspirin.and atorvastatin 20 mg.   Hypertension: Blood pressure is well controlled.  Patient sometimes gets dizzy when she leans over.  Orthostatics were checked LOV and she was not orthostatic.   Valvular heart disease: Mild mitral and severe tricuspid regurgitation.  Severe tricuspid regurgitation with moderately elevated pulmonary pressures likely secondary to interstitial lung disease.  Minimal murmur on exam    Paroxysmal atrial fibrillation: She was on carvedilol but it was stopped at one point. I started her on low dose toprol.  Event monitor showed some PSVT-7 beats, no evidence of recurrent atrial fibrillation.  Left atrium normal in size by echocardiogram. Continue aspirin.  TSH normal 05/2019. Patient's symptoms resolved on Toprol but now has developed hive/rash which is unusual from beta blocker. Will stop and refer to derm. Add diltiazem 120 mg once daily.  Rash/hives since starting toprol. Will stop and refer to derm. Benadryl 25 mg tid-qid prn, lotrimin cream to abdomen/groin area.     Hyperlipidemia: Lipid panel reviewed with LDL of 60 on 03/2019. On  atorvastatin 20 mg given coronary artery atherosclerosis noted on high-resolution CT scan.           Medication Adjustments/Labs and Tests Ordered: Current medicines are reviewed at length with the patient today.  Concerns regarding medicines are outlined above.  Medication changes, Labs and Tests ordered today are listed in the Patient Instructions below. Patient Instructions  Medication Instructions:  STOP Toprol    START  Diltiazem 120 mg daily   Take Lotrimin cream over the counter, 3-4 times a day for rash on groin   Take Benadryl 25 mg over the counter every 8 hours for itching  *If you need a refill on your cardiac medications before your next appointment, please call your pharmacy*   Lab Work: None today If you have labs (blood work) drawn today and your tests are completely normal, you will receive your results only by: Marland Kitchen MyChart Message (if you have MyChart) OR . A paper copy in the mail If you have any lab test that is abnormal or we need to change your treatment, we will call you to review the results.   Testing/Procedures: None today   Follow-Up: At [facility], you and your health needs are our priority.  As part of our continuing mission to provide you with exceptional heart care, we have created designated Provider Care Teams.  These Care Teams include your primary Cardiologist (physician) and Advanced Practice Providers (APPs -  Physician Assistants and Nurse Practitioners) who all work together to provide you with the care you need, when you need it.  We recommend signing up for the patient portal called "MyChart".  Sign up information is provided on this After Visit Summary.  MyChart is used to connect with patients for Virtual Visits (Telemedicine).  Patients are able to view lab/test results, encounter notes, upcoming appointments, etc.  Non-urgent messages can be sent to your provider as well.   To learn more about what you can do with MyChart, go to [email or web address].    Your next appointment:   6 week(s)  The format for your next appointment:   In Person  Provider:   You may see Jamesetta So  Bronson Ing, MD or one of the following Advanced Practice Providers on your designated Care Team:      Ermalinda Barrios, PA-C    We have placed an Urgent referral to [facility] dermatology in [place]. They will call you for an apt   Other Instructions      Signed, Ermalinda Barrios, PA-C  08/30/2019 12:00 PM    [address], [place],   [address] Phone: [phone]; Fax: [phone]

## 2019-08-30 ENCOUNTER — Encounter: Payer: Self-pay | Admitting: Physician Assistant

## 2019-08-30 ENCOUNTER — Other Ambulatory Visit: Payer: Self-pay

## 2019-08-30 ENCOUNTER — Ambulatory Visit (INDEPENDENT_AMBULATORY_CARE_PROVIDER_SITE_OTHER): Payer: Medicare Other | Admitting: Physician Assistant

## 2019-08-30 ENCOUNTER — Other Ambulatory Visit: Payer: Self-pay | Admitting: Family Medicine

## 2019-08-30 VITALS — BP 136/78 | HR 88 | Temp 97.5°F | Ht 62.0 in | Wt 147.0 lb

## 2019-08-30 DIAGNOSIS — I2510 Atherosclerotic heart disease of native coronary artery without angina pectoris: Secondary | ICD-10-CM | POA: Diagnosis not present

## 2019-08-30 DIAGNOSIS — L509 Urticaria, unspecified: Secondary | ICD-10-CM | POA: Diagnosis not present

## 2019-08-30 DIAGNOSIS — R21 Rash and other nonspecific skin eruption: Secondary | ICD-10-CM | POA: Diagnosis not present

## 2019-08-30 DIAGNOSIS — I208 Other forms of angina pectoris: Secondary | ICD-10-CM

## 2019-08-30 DIAGNOSIS — L299 Pruritus, unspecified: Secondary | ICD-10-CM | POA: Diagnosis not present

## 2019-08-30 DIAGNOSIS — E785 Hyperlipidemia, unspecified: Secondary | ICD-10-CM | POA: Diagnosis not present

## 2019-08-30 DIAGNOSIS — I38 Endocarditis, valve unspecified: Secondary | ICD-10-CM

## 2019-08-30 DIAGNOSIS — I10 Essential (primary) hypertension: Secondary | ICD-10-CM

## 2019-08-30 DIAGNOSIS — I480 Paroxysmal atrial fibrillation: Secondary | ICD-10-CM | POA: Diagnosis not present

## 2019-08-30 DIAGNOSIS — E039 Hypothyroidism, unspecified: Secondary | ICD-10-CM

## 2019-08-30 MED ORDER — DILTIAZEM HCL ER COATED BEADS 120 MG PO CP24: 120.0000 mg | ORAL_CAPSULE | Freq: Every day | ORAL | 3 refills | Status: DC

## 2019-08-30 NOTE — Patient Instructions (Signed)
Medication Instructions:  STOP Toprol    START Diltiazem 120 mg daily   Take Lotrimin cream over the counter, 3-4 times a day for rash on groin   Take Benadryl 25 mg over the counter every 8 hours for itching  *If you need a refill on your cardiac medications before your next appointment, please call your pharmacy*   Lab Work: None today If you have labs (blood work) drawn today and your tests are completely normal, you will receive your results only by: Marland Kitchen MyChart Message (if you have MyChart) OR . A paper copy in the mail If you have any lab test that is abnormal or we need to change your treatment, we will call you to review the results.   Testing/Procedures: None today   Follow-Up: At [facility], you and your health needs are our priority.  As part of our continuing mission to provide you with exceptional heart care, we have created designated Provider Care Teams.  These Care Teams include your primary Cardiologist (physician) and Advanced Practice Providers (APPs -  Physician Assistants and Nurse Practitioners) who all work together to provide you with the care you need, when you need it.  We recommend signing up for the patient portal called "MyChart".  Sign up information is provided on this After Visit Summary.  MyChart is used to connect with patients for Virtual Visits (Telemedicine).  Patients are able to view lab/test results, encounter notes, upcoming appointments, etc.  Non-urgent messages can be sent to your provider as well.   To learn more about what you can do with MyChart, go to [email or web address].    Your next appointment:   6 week(s)  The format for your next appointment:   In Person  Provider:   You may see Kate Sable, MD or one of the following Advanced Practice Providers on your designated Care Team:      Ermalinda Barrios, PA-C    We have placed an Urgent referral to [facility] dermatology in [place]. They will call you for an  apt   Other Instructions

## 2019-09-14 ENCOUNTER — Encounter: Payer: Self-pay | Admitting: Family Medicine

## 2019-09-14 ENCOUNTER — Telehealth (INDEPENDENT_AMBULATORY_CARE_PROVIDER_SITE_OTHER): Payer: Medicare Other | Admitting: Family Medicine

## 2019-09-14 DIAGNOSIS — J40 Bronchitis, not specified as acute or chronic: Secondary | ICD-10-CM

## 2019-09-14 DIAGNOSIS — J329 Chronic sinusitis, unspecified: Secondary | ICD-10-CM | POA: Diagnosis not present

## 2019-09-14 DIAGNOSIS — I208 Other forms of angina pectoris: Secondary | ICD-10-CM

## 2019-09-14 MED ORDER — BENZONATATE 200 MG PO CAPS: 200.0000 mg | ORAL_CAPSULE | Freq: Three times a day (TID) | ORAL | 0 refills | Status: DC | PRN

## 2019-09-14 MED ORDER — MOXIFLOXACIN HCL 400 MG PO TABS: 400.0000 mg | ORAL_TABLET | Freq: Every day | ORAL | 0 refills | Status: DC

## 2019-09-14 NOTE — Progress Notes (Signed)
Subjective:    [name], female    DOB: [birth year], 69 y.o.   MRN: [id number]   HPI: Tasha [name] is a 69 y.o. female presenting for coughing with mucous production that is yellow. Had fever last week at onset. Denies dyspnea. Nasal congestion noted as well.    Depression screen [facility] 2/9 07/14/2019 04/14/2019 01/13/2019 12/10/2018 12/09/2018  Decreased Interest 0 0 0 0 0  Down, Depressed, Hopeless 0 0 0 0 0  PHQ - 2 Score 0 0 0 0 0  Altered sleeping - - - - -  Tired, decreased energy - - - - -  Change in appetite - - - - -  Feeling bad or failure about yourself  - - - - -  Trouble concentrating - - - - -  Moving slowly or fidgety/restless - - - - -  Suicidal thoughts - - - - -  PHQ-9 Score - - - - -  Difficult doing work/chores - - - - -  Some recent data might be hidden     Relevant past medical, surgical, family and social history reviewed and updated as indicated.  Interim medical history since our last visit reviewed. Allergies and medications reviewed and updated.  ROS:  Review of Systems  Constitutional: Positive for fever. Negative for appetite change, chills, diaphoresis and fatigue.  HENT: Positive for congestion and postnasal drip. Negative for ear pain, hearing loss, rhinorrhea, sore throat and trouble swallowing.   Respiratory: Positive for cough. Negative for chest tightness and shortness of breath.   Cardiovascular: Negative for chest pain and palpitations.  Gastrointestinal: Negative for abdominal pain.  Skin: Negative for rash.     Social History   Tobacco Use  Smoking Status Former Smoker  Smokeless Tobacco Never Used       Objective:     Wt Readings from Last 3 Encounters:  08/30/19 147 lb (66.7 kg)  08/02/19 147 lb (66.7 kg)  07/14/19 144 lb 8 oz (65.5 kg)     Exam deferred. Pt. Harboring due to COVID 19. Phone visit performed.   Assessment & Plan:   1. Sinobronchitis     Meds ordered this encounter  Medications    . benzonatate (TESSALON) 200 MG capsule    Sig: Take 1 capsule (200 mg total) by mouth 3 (three) times daily as needed for cough.    Dispense:  20 capsule    Refill:  0  . moxifloxacin (AVELOX) 400 MG tablet    Sig: Take 1 tablet (400 mg total) by mouth daily. Take all of these, for infection    Dispense:  7 tablet    Refill:  0      Diagnoses and all orders for this visit:  Sinobronchitis  Other orders -     benzonatate (TESSALON) 200 MG capsule; Take 1 capsule (200 mg total) by mouth 3 (three) times daily as needed for cough. -     moxifloxacin (AVELOX) 400 MG tablet; Take 1 tablet (400 mg total) by mouth daily. Take all of these, for infection    Virtual Visit via Video Note  I discussed the limitations, risks, security and privacy concerns of performing an evaluation and management service by video and the availability of in person appointments. The patient was identified with two identifiers. oPt.expressed understanding and agreed to proceed. Pt. Is at home. Dr. Livia Snellen is in his office.  Follow Up Instructions:   I discussed the assessment and treatment plan with the patient.  The patient was provided an opportunity to ask questions and all were answered. The patient agreed with the plan and demonstrated an understanding of the instructions.   The patient was advised to call back or seek an in-person evaluation if the symptoms worsen or if the condition fails to improve as anticipated.   Total minutes including chart review and phone contact time: 14   Follow up plan: No follow-ups on file.  Claretta Fraise, MD [place]

## 2019-09-15 ENCOUNTER — Other Ambulatory Visit: Payer: Self-pay | Admitting: Family Medicine

## 2019-09-15 ENCOUNTER — Telehealth: Payer: Self-pay | Admitting: *Deleted

## 2019-09-15 MED ORDER — LEVOFLOXACIN 500 MG PO TABS: 500.0000 mg | ORAL_TABLET | Freq: Every day | ORAL | 0 refills | Status: DC

## 2019-09-15 NOTE — Telephone Encounter (Signed)
I sent in levaquin to replace moxifloxacin

## 2019-09-15 NOTE — Telephone Encounter (Signed)
Fax from [facility] Moxifloxacin HCL 400 mg Product non-formulary Could Rx for another quinolone Please advise

## 2019-10-05 NOTE — Progress Notes (Signed)
Cardiology Office Note    Date:  10/11/2019   ID:  Tasha [name], Tasha [name] 01/14/1951, MRN [id number]  PCP:  Dettinger, Fransisca Kaufmann, MD  Cardiologist: Kate Sable, MD EPS: None  No chief complaint on file.   History of Present Illness:  Tasha [name] is a 69 y.o. female with  History of chest pain with coronary calcification on CT 02/2018,normal NST 02/2018, PAF one occurrence in the setting of pulmonary stress,monitor no Afib, continue ASA unless recurrence..   Last saw Dr. Bronson Ing 05/2018 and BP up so losartan increased.    I saw the patient 08/02/2019 complaining of shortness of breath and palpitations while mopping and some chest pain.  I added low-dose Toprol-XL 25 mg half a tablet daily and placed monitor on her.   That showed heart rate from 53 to 137 bpm rare PACs very rare PVCs brief burst of SVT noted longest 7 beats no sustained arrhythmias or atrial fibrillation.  Patient triggered events did not correlate with arrhythmia.   I saw the patient 08/30/19 and palpitations improved with toprol but rash and hives all over. I stopped toprol, referred to derm and started diltiazem 120 mg daily.   Patient here accompanied by an interpreter. Never saw Dermatology. She switched her shampoo and soap and rash is gone. She also stopped the metoprolol. No palpitations on diltiazem. Feels much better. Starting to go to the gym. Heart started racing the one day but it stopped and now doing much better.     Past Medical History:  Diagnosis Date  . Acute respiratory failure ([place])   . CAP (community acquired pneumonia)   . Hyperlipidemia   . Hypertension   . Metabolic syndrome     Past Surgical History:  Procedure Laterality Date  . CESAREAN SECTION    . COLONOSCOPY N/A 06/07/2019   Procedure: COLONOSCOPY;  Surgeon: Danie Binder, MD;  [facility];  Service: Endoscopy;  Laterality: N/A;  12:45  . POLYPECTOMY  06/07/2019   Procedure: POLYPECTOMY;  Surgeon: Danie Binder, MD;  Location: [facility];  Service: Endoscopy;;    Current Medications: Current Meds  Medication Sig  . amLODipine (NORVASC) 5 MG tablet TAKE (1) TABLET EVERY NIGHT AT BEDTIME.  Marland Kitchen aspirin EC 81 MG tablet Take 1 tablet (81 mg total) by mouth daily.  Marland Kitchen atorvastatin (LIPITOR) 20 MG tablet Take 1 tablet (20 mg total) by mouth daily at 6 PM.  . benzonatate (TESSALON) 200 MG capsule Take 1 capsule (200 mg total) by mouth 3 (three) times daily as needed for cough.  . Carboxymethylcellul-Glycerin (LUBRICATING EYE DROPS OP) Place 1 drop into both eyes daily as needed (dry eyes).  . cetirizine (ZYRTEC) 10 MG tablet Take 1 tablet (10 mg total) by mouth daily as needed for allergies.  Marland Kitchen diltiazem (CARDIZEM CD) 120 MG 24 hr capsule Take 1 capsule (120 mg total) by mouth daily.  . Efinaconazole 10 % SOLN Apply 1 application topically daily. (Patient taking differently: Apply 1 application topically daily as needed (skin bumps). )  . fluticasone (FLONASE) 50 MCG/ACT nasal spray Place 1 spray into both nostrils 2 (two) times daily as needed for allergies or rhinitis.  . hydrochlorothiazide (HYDRODIURIL) 25 MG tablet Take 1 tablet (25 mg total) by mouth daily.  Marland Kitchen ibuprofen (ADVIL) 200 MG tablet Take 400 mg by mouth every 6 (six) hours as needed for headache or moderate pain.  . Ipratropium-Albuterol (COMBIVENT RESPIMAT) 20-100 MCG/ACT AERS respimat Inhale 1 puff into the  lungs every 6 (six) hours as needed for wheezing.  Marland Kitchen levothyroxine (SYNTHROID) 50 MCG tablet TAKE (1) TABLET DAILY BEFORE BREAKFAST.  Marland Kitchen losartan (COZAAR) 25 MG tablet Take 1 tablet (25 mg total) by mouth daily.  . Omega 3 1000 MG CAPS Take 1,000 mg by mouth daily.   . pantoprazole (PROTONIX) 40 MG tablet Take 1 tablet (40 mg total) by mouth daily.  . [DISCONTINUED] VITAMIN D PO Take 1 capsule by mouth daily.     Allergies:   Hydromet [hydrocodone-homatropine], Toprol xl [metoprolol], and Amoxicillin   Social History    Socioeconomic History  . Marital status: Married    Spouse name: Ramiro  . Number of children: 3  . Years of education: 6  . Highest education level: 6th grade  Occupational History  . Occupation: retired  Tobacco Use  . Smoking status: Former Research scientist (life sciences)  . Smokeless tobacco: Never Used  Vaping Use  . Vaping Use: Never used  Substance and Sexual Activity  . Alcohol use: No  . Drug use: No  . Sexual activity: Not Currently    Birth control/protection: Post-menopausal  Other Topics Concern  . Not on file  Social History Narrative  . Not on file   Social Determinants of Health   Financial Resource Strain:   . Difficulty of Paying Living Expenses:   Food Insecurity:   . Worried About Charity fundraiser in the Last Year:   . Arboriculturist in the Last Year:   Transportation Needs:   . Film/video editor (Medical):   Marland Kitchen Lack of Transportation (Non-Medical):   Physical Activity:   . Days of Exercise per Week:   . Minutes of Exercise per Session:   Stress: No Stress Concern Present  . Feeling of Stress : Not at all  Social Connections: Unknown  . Frequency of Communication with Friends and Family: Twice a week  . Frequency of Social Gatherings with Friends and Family: Once a week  . Attends Religious Services: Not on file  . Active Member of Clubs or Organizations: Not on file  . Attends Archivist Meetings: Not on file  . Marital Status: Not on file     Family History:  The patient's family history includes Cancer in her father; Healthy in her brother, daughter, sister, son, and son; Memory loss in her sister.   ROS:   Please see the history of present illness.    ROS All other systems reviewed and are negative.   PHYSICAL EXAM:   VS:  BP 132/60   Pulse 86   Ht 5\' 2"  (1.575 m)   Wt 145 lb 12.8 oz (66.1 kg)   SpO2 98%   BMI 26.67 kg/m   Physical Exam  GEN: Well nourished, well developed, in no acute distress  Neck: no JVD, carotid bruits, or  masses Cardiac:RRR; no murmurs, rubs, or gallops  Respiratory:  clear to auscultation bilaterally, normal work of breathing GI: soft, nontender, nondistended, + BS Ext: without cyanosis, clubbing, or edema, Good distal pulses bilaterally Neuro:  Alert and Oriented x 3 Psych: euthymic mood, full affect  Wt Readings from Last 3 Encounters:  10/11/19 145 lb 12.8 oz (66.1 kg)  08/30/19 147 lb (66.7 kg)  08/02/19 147 lb (66.7 kg)      Studies/Labs Reviewed:   EKG:  EKG is not ordered today.    Recent Labs: 04/14/2019: ALT 30; Hemoglobin 12.9; Platelets 150 07/14/2019: BUN 14; Creatinine, Ser 0.74; Potassium 3.6; Sodium 141;  TSH 1.150   Lipid Panel    Component Value Date/Time   CHOL 116 04/14/2019 0913   CHOL 180 09/23/2012 1335   TRIG 101 04/14/2019 0913   TRIG 108 08/10/2014 0852   TRIG 135 09/23/2012 1335   HDL 37 (L) 04/14/2019 0913   HDL 40 08/10/2014 0852   HDL 37 (L) 09/23/2012 1335   CHOLHDL 3.1 04/14/2019 0913   LDLCALC 60 04/14/2019 0913   LDLCALC 118 (H) 05/04/2013 1528   LDLCALC 116 (H) 09/23/2012 1335   LDLDIRECT 77 07/11/2016 1536    Additional studies/ records that were reviewed today include:  Holter monitor 08/23/2019   Study Highlights   Patient of Dr. Bronson Ing, seen most recently by Ms. Vita Barley in April and this monitor was requested.  Preventice monitor resulted, 8 days 8 hours 21 minutes analyzed.  Predominant rhythm is sinus with heart rate ranging from 53 bpm up to 137 bpm and average heart rate 78 bpm.  Rare PACs were noted representing less than 1% of total beats.  Very rare PVCs noted.  Brief bursts of SVT noted, the longest of which was 7 beats.  There were no sustained arrhythmias or pauses.  No definite atrial fibrillation.  Patient triggered events did not clearly correlate with arrhythmia.    CT 11/2018 IMPRESSION: 1. There is a mild pattern of pulmonary fibrosis with an apical to basal gradient featuring mild tubular bronchiectasis,  irregular peripheral interstitial opacity with some superimposed ground-glass, and some evidence of bronchiolectasis in the lung bases. There is no overt honeycombing. No significant air trapping on expiratory phase imaging. Findings are not significantly changed compared to prior examination and consistent with an "indeterminate for UIP" pattern fibrosis by ATS pulmonary fibrosis criteria.   2.  Aortic atherosclerosis and coronary artery disease.     Electronically Signed   By: Eddie Candle M.D.   On: 12/10/2018 12:13     NST 03/09/18  There was no ST segment deviation noted during stress.  The study is normal. There are no perfusion defects consistent with prior infarct or current ischemia.  This is a low risk study.  The left ventricular ejection fraction is normal (55-65%).   Echo 11/2017 Study Conclusions   - Left ventricle: The cavity size was normal. Systolic function was    vigorous. The estimated ejection fraction was in the range of 65%    to 70%. Wall motion was normal; there were no regional wall    motion abnormalities. Doppler parameters are consistent with    abnormal left ventricular relaxation (grade 1 diastolic    dysfunction). There was no evidence of elevated ventricular    filling pressure by Doppler parameters.  - Aortic valve: Valve area (VTI): 1.43 cm^2. Valve area (Vmax):    1.67 cm^2. Valve area (Vmean): 1.63 cm^2.  - Aortic root: The aortic root was normal in size.  - Mitral valve: There was mild regurgitation.  - Left atrium: The atrium was normal in size.  - Right ventricle: Systolic function was normal.  - Tricuspid valve: There was severe regurgitation.  - Pulmonary arteries: Systolic pressure was moderately increased.    PA peak pressure: 54 mm Hg (S).  - Inferior vena cava: The vessel was normal in size.  - Pericardium, extracardiac: There was no pericardial effusion.          ASSESSMENT:    1. Coronary artery disease involving  native coronary artery of native heart without angina pectoris   2. Essential hypertension  3. Valvular heart disease   4. Paroxysmal atrial fibrillation (HCC)   5. Rash   6. Hyperlipidemia, unspecified hyperlipidemia type      PLAN:  In order of problems listed above:  Coronary artery atherosclerosis/chest pain: Nuclear stress test was normal on 03/09/2018. Coronary atherosclerosis noted on chest CT as detailed above.  Continue aspirin.and atorvastatin 20 mg.   Hypertension: Blood pressure is well controlled.  Patient sometimes gets dizzy when she leans over.  Orthostatics were checked LOV and she was not orthostatic.   Valvular heart disease: Mild mitral and severe tricuspid regurgitation.  Severe tricuspid regurgitation with moderately elevated pulmonary pressures likely secondary to interstitial lung disease.  Minimal murmur on exam    Paroxysmal atrial fibrillation: She was on carvedilol but it was stopped at one point. I started her on low dose toprol.  Event monitor showed some PSVT-7 beats, no evidence of recurrent atrial fibrillation.  Left atrium normal in size by echocardiogram. Continue aspirin.  TSH normal 05/2019. Patient's symptoms resolved on Toprol but now has developed hive/rash which is unusual from beta blocker. Will stop and refer to derm. Added diltiazem 120 mg once daily.   Rash/hives since starting toprol.I stopped toprol  and referred to derm but she never went. She changed her soap/shampoo and stopped toprol. Rash is gone.      Hyperlipidemia: Lipid panel reviewed with LDL of 60 on 03/2019. On  atorvastatin 20 mg given coronary artery atherosclerosis noted on high-resolution CT scan.         Medication Adjustments/Labs and Tests Ordered: Current medicines are reviewed at length with the patient today.  Concerns regarding medicines are outlined above.  Medication changes, Labs and Tests ordered today are listed in the Patient Instructions below. Patient  Instructions  Medication Instructions:  Your physician recommends that you continue on your current medications as directed. Please refer to the Current Medication list given to you today.  *If you need a refill on your cardiac medications before your next appointment, please call your pharmacy*   Lab Work: None today If you have labs (blood work) drawn today and your tests are completely normal, you will receive your results only by: Marland Kitchen MyChart Message (if you have MyChart) OR . A paper copy in the mail If you have any lab test that is abnormal or we need to change your treatment, we will call you to review the results.   Testing/Procedures: None today   Follow-Up: At [facility], you and your health needs are our priority.  As part of our continuing mission to provide you with exceptional heart care, we have created designated Provider Care Teams.  These Care Teams include your primary Cardiologist (physician) and Advanced Practice Providers (APPs -  Physician Assistants and Nurse Practitioners) who all work together to provide you with the care you need, when you need it.  We recommend signing up for the patient portal called "MyChart".  Sign up information is provided on this After Visit Summary.  MyChart is used to connect with patients for Virtual Visits (Telemedicine).  Patients are able to view lab/test results, encounter notes, upcoming appointments, etc.  Non-urgent messages can be sent to your provider as well.   To learn more about what you can do with MyChart, go to [email or web address].    Your next appointment:   4 month(s)  The format for your next appointment:   In Person  Provider:   Kate Sable, MD   Other Instructions None  Sumner Boast, PA-C  10/11/2019 12:57 PM    [facility] [address], [place], [place]  [address] Phone: [phone]; Fax: [phone]

## 2019-10-11 ENCOUNTER — Other Ambulatory Visit: Payer: Self-pay

## 2019-10-11 ENCOUNTER — Ambulatory Visit (INDEPENDENT_AMBULATORY_CARE_PROVIDER_SITE_OTHER): Payer: Medicare Other | Admitting: Physician Assistant

## 2019-10-11 ENCOUNTER — Encounter: Payer: Self-pay | Admitting: Physician Assistant

## 2019-10-11 VITALS — BP 132/60 | HR 86 | Ht 62.0 in | Wt 145.8 lb

## 2019-10-11 DIAGNOSIS — I480 Paroxysmal atrial fibrillation: Secondary | ICD-10-CM | POA: Diagnosis not present

## 2019-10-11 DIAGNOSIS — I38 Endocarditis, valve unspecified: Secondary | ICD-10-CM | POA: Diagnosis not present

## 2019-10-11 DIAGNOSIS — I10 Essential (primary) hypertension: Secondary | ICD-10-CM

## 2019-10-11 DIAGNOSIS — I2510 Atherosclerotic heart disease of native coronary artery without angina pectoris: Secondary | ICD-10-CM

## 2019-10-11 DIAGNOSIS — E785 Hyperlipidemia, unspecified: Secondary | ICD-10-CM

## 2019-10-11 DIAGNOSIS — I208 Other forms of angina pectoris: Secondary | ICD-10-CM

## 2019-10-11 DIAGNOSIS — R21 Rash and other nonspecific skin eruption: Secondary | ICD-10-CM

## 2019-10-11 NOTE — Patient Instructions (Signed)
Medication Instructions:  Your physician recommends that you continue on your current medications as directed. Please refer to the Current Medication list given to you today.  *If you need a refill on your cardiac medications before your next appointment, please call your pharmacy*   Lab Work: None today If you have labs (blood work) drawn today and your tests are completely normal, you will receive your results only by: Marland Kitchen MyChart Message (if you have MyChart) OR . A paper copy in the mail If you have any lab test that is abnormal or we need to change your treatment, we will call you to review the results.   Testing/Procedures: None today   Follow-Up: At [facility], you and your health needs are our priority.  As part of our continuing mission to provide you with exceptional heart care, we have created designated Provider Care Teams.  These Care Teams include your primary Cardiologist (physician) and Advanced Practice Providers (APPs -  Physician Assistants and Nurse Practitioners) who all work together to provide you with the care you need, when you need it.  We recommend signing up for the patient portal called "MyChart".  Sign up information is provided on this After Visit Summary.  MyChart is used to connect with patients for Virtual Visits (Telemedicine).  Patients are able to view lab/test results, encounter notes, upcoming appointments, etc.  Non-urgent messages can be sent to your provider as well.   To learn more about what you can do with MyChart, go to [email or web address].    Your next appointment:   4 month(s)  The format for your next appointment:   In Person  Provider:   Kate Sable, MD   Other Instructions None

## 2019-10-18 ENCOUNTER — Other Ambulatory Visit: Payer: Self-pay

## 2019-10-18 ENCOUNTER — Ambulatory Visit (INDEPENDENT_AMBULATORY_CARE_PROVIDER_SITE_OTHER): Payer: Medicare Other | Admitting: Family Medicine

## 2019-10-18 ENCOUNTER — Encounter: Payer: Self-pay | Admitting: Family Medicine

## 2019-10-18 VITALS — BP 127/76 | HR 89 | Temp 98.9°F | Ht 62.0 in | Wt 142.0 lb

## 2019-10-18 DIAGNOSIS — J8410 Pulmonary fibrosis, unspecified: Secondary | ICD-10-CM

## 2019-10-18 DIAGNOSIS — I208 Other forms of angina pectoris: Secondary | ICD-10-CM

## 2019-10-18 DIAGNOSIS — I700 Atherosclerosis of aorta: Secondary | ICD-10-CM

## 2019-10-18 DIAGNOSIS — I10 Essential (primary) hypertension: Secondary | ICD-10-CM | POA: Diagnosis not present

## 2019-10-18 DIAGNOSIS — E039 Hypothyroidism, unspecified: Secondary | ICD-10-CM

## 2019-10-18 DIAGNOSIS — E785 Hyperlipidemia, unspecified: Secondary | ICD-10-CM | POA: Diagnosis not present

## 2019-10-18 DIAGNOSIS — R7303 Prediabetes: Secondary | ICD-10-CM | POA: Diagnosis not present

## 2019-10-18 LAB — BAYER DCA HB A1C WAIVED: HB A1C (BAYER DCA - WAIVED): 5.6 % (ref <7.0)

## 2019-10-18 MED ORDER — HYDROCHLOROTHIAZIDE 25 MG PO TABS: 25.0000 mg | ORAL_TABLET | Freq: Every day | ORAL | 3 refills | Status: DC

## 2019-10-18 MED ORDER — LOSARTAN POTASSIUM 25 MG PO TABS: 25.0000 mg | ORAL_TABLET | Freq: Every day | ORAL | 3 refills | Status: DC

## 2019-10-18 NOTE — Progress Notes (Signed)
BP 127/76   Pulse 89   Temp 98.9 F (37.2 C)   Ht 5' 2"  (1.575 m)   Wt 142 lb (64.4 kg)   SpO2 96%   BMI 25.97 kg/m    Subjective:   [name], female    DOB: 10-10-1950, 69 y.o.   MRN: [id number]  HPI: Tasha [name] is a 69 y.o. female presenting on 10/18/2019 for Medical Management of Chronic Issues (Pre diabetes), Hypertension, and Hypothyroidism   HPI Prediabetes Patient comes in today for recheck of his diabetes. Patient has been currently taking no medication and her A1c today is 5.6. Patient is currently on an ACE inhibitor/ARB. Patient has not seen an ophthalmologist this year. Patient denies any issues with their feet. The symptom started onset as an adult hypertension and hypothyroidism and hyperlipidemia ARE RELATED TO DM   Hypertension Patient is currently on amlodipine and diltiazem and hydrochlorothiazide and losartan, and their blood pressure today is 127/76. Patient denies any lightheadedness or dizziness. Patient denies headaches, blurred vision, chest pains, shortness of breath, or weakness. Denies any side effects from medication and is content with current medication.   Hypothyroidism recheck Patient is coming in for thyroid recheck today as well. They deny any issues with hair changes or heat or cold problems or diarrhea or constipation. They deny any chest pain or palpitations. They are currently on levothyroxine 50 micrograms   Hyperlipidemia Patient is coming in for recheck of his hyperlipidemia. The patient is currently taking fish oil and atorvastatin. They deny any issues with myalgias or history of liver damage from it. They deny any focal numbness or weakness or chest pain.   Patient has a chronic cough that comes in waves and gets worse at times.  She denies any shortness of breath, she has had a cardiac work-up and is not seeing pulmonology.  She had a CT scan that showed pulmonary fibrosis last year in the hospital it was a stat  follow-up on that but has not.  Relevant past medical, surgical, family and social history reviewed and updated as indicated. Interim medical history since our last visit reviewed. Allergies and medications reviewed and updated.  Review of Systems  Constitutional: Negative for chills and fever.  HENT: Negative for congestion, ear discharge and ear pain.   Eyes: Negative for redness and visual disturbance.  Respiratory: Positive for cough. Negative for chest tightness, shortness of breath and wheezing.   Cardiovascular: Negative for chest pain and leg swelling.  Skin: Negative for rash.  Neurological: Negative for light-headedness and headaches.  Psychiatric/Behavioral: Negative for agitation and behavioral problems.  All other systems reviewed and are negative.   Per HPI unless specifically indicated above   Allergies as of 10/18/2019      Reactions   Hydromet [hydrocodone-homatropine]    Pt doesn't remember   Toprol Xl [metoprolol] Hives   Facial rash, itching, hives   Amoxicillin Rash   Did it involve swelling of the face/tongue/throat, SOB, or low BP? No Did it involve sudden or severe rash/hives, skin peeling, or any reaction on the inside of your mouth or nose? Unknown Did you need to seek medical attention at a hospital or doctor's office? Unknown When did it last happen?10 + years If all above answers are "NO", may proceed with cephalosporin use.      Medication List       Accurate as of October 18, 2019 11:34 AM. If you have any questions, ask your nurse or doctor.  amLODipine 5 MG tablet Commonly known as: NORVASC TAKE (1) TABLET EVERY NIGHT AT BEDTIME.   aspirin EC 81 MG tablet Take 1 tablet (81 mg total) by mouth daily.   atorvastatin 20 MG tablet Commonly known as: LIPITOR Take 1 tablet (20 mg total) by mouth daily at 6 PM.   benzonatate 200 MG capsule Commonly known as: TESSALON Take 1 capsule (200 mg total) by mouth 3 (three) times daily as  needed for cough.   cetirizine 10 MG tablet Commonly known as: ZYRTEC Take 1 tablet (10 mg total) by mouth daily as needed for allergies.   Combivent Respimat 20-100 MCG/ACT Aers respimat Generic drug: Ipratropium-Albuterol Inhale 1 puff into the lungs every 6 (six) hours as needed for wheezing.   diltiazem 120 MG 24 hr capsule Commonly known as: CARDIZEM CD Take 1 capsule (120 mg total) by mouth daily.   Efinaconazole 10 % Soln Apply 1 application topically daily. What changed:   when to take this  reasons to take this   fluticasone 50 MCG/ACT nasal spray Commonly known as: FLONASE Place 1 spray into both nostrils 2 (two) times daily as needed for allergies or rhinitis.   hydrochlorothiazide 25 MG tablet Commonly known as: HYDRODIURIL Take 1 tablet (25 mg total) by mouth daily.   ibuprofen 200 MG tablet Commonly known as: ADVIL Take 400 mg by mouth every 6 (six) hours as needed for headache or moderate pain.   levothyroxine 50 MCG tablet Commonly known as: SYNTHROID TAKE (1) TABLET DAILY BEFORE BREAKFAST.   losartan 25 MG tablet Commonly known as: Cozaar Take 1 tablet (25 mg total) by mouth daily.   LUBRICATING EYE DROPS OP Place 1 drop into both eyes daily as needed (dry eyes).   Omega 3 1000 MG Caps Take 1,000 mg by mouth daily.   pantoprazole 40 MG tablet Commonly known as: PROTONIX Take 1 tablet (40 mg total) by mouth daily.        Objective:   BP 127/76   Pulse 89   Temp 98.9 F (37.2 C)   Ht 5' 2"  (1.575 m)   Wt 142 lb (64.4 kg)   SpO2 96%   BMI 25.97 kg/m   Wt Readings from Last 3 Encounters:  10/18/19 142 lb (64.4 kg)  10/11/19 145 lb 12.8 oz (66.1 kg)  08/30/19 147 lb (66.7 kg)    Physical Exam Vitals and nursing note reviewed.  Constitutional:      General: She is not in acute distress.    Appearance: She is well-developed. She is not diaphoretic.  Eyes:     Conjunctiva/sclera: Conjunctivae normal.  Cardiovascular:     Rate  and Rhythm: Normal rate and regular rhythm.     Heart sounds: Normal heart sounds. No murmur heard.   Pulmonary:     Effort: Pulmonary effort is normal. No respiratory distress.     Breath sounds: Normal breath sounds. No wheezing, rhonchi or rales.  Musculoskeletal:        General: No tenderness. Normal range of motion.  Skin:    General: Skin is warm and dry.     Findings: No rash.  Neurological:     Mental Status: She is alert and oriented to person, place, and time.     Coordination: Coordination normal.  Psychiatric:        Behavior: Behavior normal.       Assessment & Plan:   Problem List Items Addressed This Visit      Cardiovascular and Mediastinum  Hypertension   Relevant Medications   hydrochlorothiazide (HYDRODIURIL) 25 MG tablet   losartan (COZAAR) 25 MG tablet     Endocrine   Hypothyroidism   Relevant Orders   TSH     Other   Hyperlipidemia with target LDL less than 100   Relevant Medications   hydrochlorothiazide (HYDRODIURIL) 25 MG tablet   losartan (COZAAR) 25 MG tablet   Other Relevant Orders   Lipid panel   Prediabetes - Primary   Relevant Medications   losartan (COZAAR) 25 MG tablet   Other Relevant Orders   Bayer DCA Hb A1c Waived   CMP14+EGFR   Lipid panel    Other Visit Diagnoses    Pulmonary fibrosis ([place])       Relevant Orders   Ambulatory referral to Pulmonology   Aortic atherosclerosis (HCC)       Relevant Medications   hydrochlorothiazide (HYDRODIURIL) 25 MG tablet   losartan (COZAAR) 25 MG tablet      Will continue current medication based on a previous CT scan and no follow-up we will refer her to pulmonology see if we can get her help with her chronic cough and a CT scan that showed pulmonary fibrosis. No change in medication for now. Follow up plan: Return in about 3 months (around 01/18/2020), or if symptoms worsen or fail to improve, for Thyroid and prediabetes recheck.  Counseling provided for all of the vaccine  components Orders Placed This Encounter  Procedures  . Bayer [facility] Hb A1c [place], MD [facility] 10/18/2019, 11:34 AM

## 2019-10-19 LAB — CMP14+EGFR
ALT: 43 IU/L — ABNORMAL HIGH (ref 0–32)
AST: 41 IU/L — ABNORMAL HIGH (ref 0–40)
Albumin/Globulin Ratio: 2.1 (ref 1.2–2.2)
Albumin: 4.4 g/dL (ref 3.8–4.8)
Alkaline Phosphatase: 75 IU/L (ref 48–121)
BUN/Creatinine Ratio: 16 (ref 12–28)
BUN: 14 mg/dL (ref 8–27)
Bilirubin Total: 0.3 mg/dL (ref 0.0–1.2)
CO2: 25 mmol/L (ref 20–29)
Calcium: 9.3 mg/dL (ref 8.7–10.3)
Chloride: 103 mmol/L (ref 96–106)
Creatinine, Ser: 0.87 mg/dL (ref 0.57–1.00)
GFR calc Af Amer: 79 mL/min/{1.73_m2} (ref >59)
GFR calc non Af Amer: 68 mL/min/{1.73_m2} (ref >59)
Globulin, Total: 2.1 g/dL (ref 1.5–4.5)
Glucose: 131 mg/dL — ABNORMAL HIGH (ref 65–99)
Potassium: 3.3 mmol/L — ABNORMAL LOW (ref 3.5–5.2)
Sodium: 141 mmol/L (ref 134–144)
Total Protein: 6.5 g/dL (ref 6.0–8.5)

## 2019-10-19 LAB — LIPID PANEL
Chol/HDL Ratio: 3.3 ratio (ref 0.0–4.4)
Cholesterol, Total: 117 mg/dL (ref 100–199)
HDL: 36 mg/dL — ABNORMAL LOW (ref >39)
LDL Chol Calc (NIH): 51 mg/dL (ref 0–99)
Triglycerides: 177 mg/dL — ABNORMAL HIGH (ref 0–149)
VLDL Cholesterol Cal: 30 mg/dL (ref 5–40)

## 2019-10-19 LAB — TSH: TSH: 1.09 u[IU]/mL (ref 0.450–4.500)

## 2019-10-22 ENCOUNTER — Encounter: Payer: Self-pay | Admitting: *Deleted

## 2019-10-22 NOTE — Progress Notes (Signed)
Please call or come by to review lab results letter mailed.

## 2019-12-06 ENCOUNTER — Institutional Professional Consult (permissible substitution): Payer: Medicare Other | Admitting: Pulmonary Disease

## 2019-12-07 ENCOUNTER — Other Ambulatory Visit: Payer: Self-pay

## 2019-12-07 ENCOUNTER — Encounter: Payer: Self-pay | Admitting: Internal Medicine

## 2019-12-07 ENCOUNTER — Ambulatory Visit (INDEPENDENT_AMBULATORY_CARE_PROVIDER_SITE_OTHER): Payer: Medicare Other | Admitting: Internal Medicine

## 2019-12-07 VITALS — BP 122/70 | HR 87 | Ht 62.0 in | Wt 146.2 lb

## 2019-12-07 DIAGNOSIS — R0602 Shortness of breath: Secondary | ICD-10-CM | POA: Diagnosis not present

## 2019-12-07 DIAGNOSIS — Z9109 Other allergy status, other than to drugs and biological substances: Secondary | ICD-10-CM | POA: Diagnosis not present

## 2019-12-07 DIAGNOSIS — R05 Cough: Secondary | ICD-10-CM | POA: Diagnosis not present

## 2019-12-07 DIAGNOSIS — J849 Interstitial pulmonary disease, unspecified: Secondary | ICD-10-CM

## 2019-12-07 DIAGNOSIS — I208 Other forms of angina pectoris: Secondary | ICD-10-CM

## 2019-12-07 DIAGNOSIS — R053 Chronic cough: Secondary | ICD-10-CM

## 2019-12-07 LAB — CBC WITH DIFFERENTIAL/PLATELET
Basophils Absolute: 0 10*3/uL (ref 0.0–0.1)
Basophils Relative: 0.4 % (ref 0.0–3.0)
Eosinophils Absolute: 0.2 10*3/uL (ref 0.0–0.7)
Eosinophils Relative: 2.4 % (ref 0.0–5.0)
HCT: 38.6 % (ref 36.0–46.0)
Hemoglobin: 13.1 g/dL (ref 12.0–15.0)
Lymphocytes Relative: 20.2 % (ref 12.0–46.0)
Lymphs Abs: 1.7 10*3/uL (ref 0.7–4.0)
MCHC: 34 g/dL (ref 30.0–36.0)
MCV: 89.2 fl (ref 78.0–100.0)
Monocytes Absolute: 1 10*3/uL (ref 0.1–1.0)
Monocytes Relative: 11.9 % (ref 3.0–12.0)
Neutro Abs: 5.5 10*3/uL (ref 1.4–7.7)
Neutrophils Relative %: 65.1 % (ref 43.0–77.0)
Platelets: 107 10*3/uL — ABNORMAL LOW (ref 150.0–400.0)
RBC: 4.33 Mil/uL (ref 3.87–5.11)
RDW: 14.6 % (ref 11.5–15.5)
WBC: 8.4 10*3/uL (ref 4.0–10.5)

## 2019-12-07 NOTE — Patient Instructions (Signed)
ICD-10-CM   1. Interstitial lung disease ([place])  J84.9   2. Shortness of breath  R06.02   3. Chronic cough  R05   4. History of environmental allergies  Z91.09     Do CBC with differential, blood IgE and [place] allergy panel  Do pulmonary function test pre and postbronchodilator  Do exhaled nitric oxide testing  Do high-resolution CT scan of the chest supine and prone  This test can be done anytime in the next few weeks to few months  Plan -Return to follow-up to see me or nurse practitioner in September 2021/October 2021 and a 30-minute slot on any day  -Bring interpreter with you

## 2019-12-07 NOTE — Progress Notes (Signed)
OV 01/22/18 69 year old female, never smoked. PMH  Hypertension, metabolic syndrome, thrombocytopenia, prediabetes, interstitial lung disease, PAF. Not previously followed by [facility]. Admitted to [facility] from outside hospital on 08/27 with acute hypoxic respiratory failure in setting of bilateral pulmonary infiltrates. Work up indicated [facility]. Dx subacute ILD vs pneumonia thought to be from exposure to antifreeze leak in patients car. Requiring intubation from 08/30-09/05, chest tube placed due to pneumothorax. [facility] from 09/10- 09/20, required min assistance with mobility and basic self-care tasks. Speech therapy worked on dysphagia, able to tolerate regular diet thin liquids without signs and symptoms of aspiration.    Patient presents today for hospital follow-up visit. Accompanied by her daughter who also provides translation. She is feeling a lot better, no breathing issues. Finished oral steroid today. BS 119. CBC improving. Eating and drinking ok. No difficulties swallowing and denies aspiration. Ambulting with walker. Denies fall, sob, wheezing, cough, fever or chest pain.    Results for Tasha, [name] (MRN [id number]) as of 02/19/2018 10:01  Ref. Range 12/16/2017 19:24 12/17/2017 18:59  Sed Rate Latest Ref Range: 0 - 22 mm/hr 76 (H) 69 (H)  Results for Tasha, [name] (MRN [id number]) as of 02/19/2018 10:01  Ref. Range 12/17/2017 18:59  Speckled Pattern Unknown 1:320 (H)  Results for Tasha, [name] (MRN [id number]) as of 02/19/2018 10:01  Ref. Range 12/19/2017 14:15  Color, Fluid Latest Ref Range: YELLOW  COLORLESS (A)  WBC, Fluid Latest Ref Range: 0 - 1,000 cu mm 93  Lymphs, Fluid Latest Units: % 78  Eos, Fluid Latest Units: % 2  Appearance, Fluid Latest Ref Range: CLEAR  CLEAR (A)  Neutrophil Count, Fluid Latest Ref Range: 0 - 25 % 7  Monocyte-Macrophage-Serous Fluid Latest Ref Range: 50 - 90 % 13 (L)     OV  02/19/2018  Subjective:  [name], female , DOB: 1950/06/10 , age 10 y.o. , MRN: [id number] , ADDRESS: [address] [place] [address]   02/19/2018 -   Chief Complaint  Patient presents with  . Follow-up    States she still has dry cough, states the SOB has resolved. Denies chest pain or idscomfort.      HPI Tasha [name] 69 y.o. -accompanied by her daughter Tasha [name].  End of August 2019 she suffered acute lung injury possibly [place] based on the nature of infiltrates and a high ESR and a positive ANA [BAL lymphocytosis].  There is some history of antifreeze exposure.  She has recovered from it and is now at home.  She is brought in by Dr. Verdis [name].  Tasha [name] is doing the translation.  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  Post discharge in the hospital patient is off oxygen and is overall better.  Steroids ended 2 weeks ago but since the completion of steroids she feels the cough is coming back and getting worse.  Currently cough is moderate in intensity and without any sputum production.  2 days ago her primary care physician apparently changed 1 of her antihypertensives because of the cough.  I am unable to determine which one but I suspect it was an ACE inhibitor.  She is currently on losartan.  There is no fever or chills.  Current walking desaturation test appears adequate        ROS  OV 03/31/2018  Subjective:  [name], female , DOB: 1951-01-13 ,  age 41 y.o. , MRN: [id number] , ADDRESS: [address] [place] [address]   03/31/2018 -   Chief Complaint  Patient presents with  . Follow-up    pt states breathing is doing well. c/o non prod cough.     HPI Tasha [name] 69 y.o. -presents for interstitial lung disease acute with lymphocytic bronchoalveolar lavage after antifreeze exposure  After last visit she showed improvement.  We repeated ANA and this was negative.  We also did some  extra antibody such as anti-Jo 1 and this was negative.  Her ESR has normalized.  In the interim she continues to improve.  Shortness of breath is almost nonexistent.  Cough is improved but she still has mild residual cough.  She had high-resolution CT scan of the chest #2019 that I personally visualized and there is still some residual groundglass opacities along with some evolving fibrotic pattern.  It seems more predominant in the lung base.  Her her son Tasha [name] is with her.  History is gained from talking to interpreter on the video camera.  His name is Tasha [name].  She denies any diabetes or hyperlipidemia or stroke in the past.  She does not have any mood swings.  In the past she has tolerated prednisone without problem.  Denies any bony issues.   OV 06/11/2018  Subjective:  [name], female , DOB: 1951/01/06 , age 69 y.o. , MRN: [id number] , ADDRESS: [address] [place] [address]  Tasha [name] 69 y.o. -presents for interstitial lung disease acute with lymphocytic bronchoalveolar lavage after antifreeze exposure 06/11/2018 -   Chief Complaint  Patient presents with  . Follow-up    PFT performed today.  Pt states she has been doing well since last visit and denies any complaints.      HPI Tasha [name] 69 y.o. -follow-up.  At last visit in December 2019 I placed her on a 10-week prednisone course.  In between she came and saw a nurse practitioner approximately just over a month ago.  She was tolerating the prednisone fine.  It is now 4 weeks and she finished the prednisone.  According to the daughter who acted as the interpreter the prednisone really did help with her symptoms.  It improved her shortness of breath.  Currently she is nearly asymptomatic.  Her current symptoms are documented below.  She is feeling good.  She had pulmonary function test today with spirometry being normal and DLCO being near normal.  She is noted to be on ACE inhibitor but she denies any cough.   Last CT scan of the chest was November 2019    OV 12/07/2019   Subjective:  [name], female , DOB: 1950/08/18, age 77 y.o. years. , MRN: [id number],  ADDRESS: [place] [address] PCP  Dettinger, Fransisca Kaufmann, MD Providers : Treatment Team:  Attending Provider: Brand Males, MD presents for interstitial lung disease acute with lymphocytic bronchoalveolar lavage after antifreeze exposure in aug 2019  Chief Complaint  Patient presents with  . Follow-up    Last seen 06/11/2018. Pt states she has been about the same since last visit. Pt is still having complaints of cough and is coughing up occ white phlegm. Pt also has increased SOB when she is in enclosed areas that makes her feel like she cannot breathe.       HPI Tasha [name] 69 y.o. -presents with her daughter-in-law is for a face-to-face visit.  The interpreter is Tasha [name].  Tasha [name]  is from [facility] health.  It appears that overall since her last visit in February 2020 patient is stable.  In August 2020 she had a CT scan of the chest that shows presence of mild ongoing ILD.  She tells [place] that she is had the Covid vaccine.  She has been exercising at the [facility].  She does not use oxygen.  She has mild shortness of breath with exertion when she bends down and some mild cough but overall compared to 2019 she is slowly progressively better.  She does have some dry cough when she is sitting as well.  She needs to drink water to improve that.  There are no other new medical issues.  Her walking desaturation test is unchanged compared to February 2020.   6 however when she did the interstitial lung disease symptom questionnaire symptoms are significantly worse than before.  She really cannot pinpoint if she is definitely worse or not.  She did admit that she has significant environmental allergies to pollen dust and perfumes.  This is a new history.   SYMPTOM SCALE - ILD 06/11/2018  12/07/2019   O2 use ra ra  Shortness  of Breath 0 -> 5 scale with 5 being worst (score 6 If unable to do)   At rest 0 0  Simple tasks - showers, clothes change, eating, shaving 0 3  Household (dishes, doing bed, laundry) 0 3  Shopping 1 4  Walking level at own pace 0 4  Walking stairs 0 6  Total (40 - 48) Dyspnea Score 1   How bad is your cough? 0 5  How bad is your fatigue 3 4  nausea  0  vomit  0  diarrhea  0  anzity  3  Dep[resssio  meds      Simple office walk 185 feet x  3 laps goal with forehead probe 02/19/2018  03/31/2018  06/11/2018  12/07/2019   O2 used rooom air Room air Room air ra  Number laps completed 3 3 x 250 feet 3 x 236 feet 3 laops  Comments about pace Moderate pace Mod pace Normal pace avg pace  Resting Pulse Ox/HR 99% and 71/min 100% and 89/min 100% and 83/mn 100% and 83/min  Final Pulse Ox/HR 95% and 92/min 98% and 111 98% and 101/min 98% and 101/min  Desaturated </= 88% no no no no  Desaturated <= 3% points yes no no no  Got Tachycardic >/= 90/min yes yes yes yes  Symptoms at end of test none none none Mild dyspnea  Miscellaneous comments x x x       Results for ELSY, CHIANG (MRN [id number]) as of 06/11/2018 12:13  Ref. Range 06/11/2018 10:31  FVC-Pre Latest Units: L 2.05  FVC-%Pred-Pre Latest Units: % 74  FEV1-Pre Latest Units: L 1.88  FEV1-%Pred-Pre Latest Units: % 90  Pre FEV1/FVC ratio Latest Units: % 92   Results for EYMI, LIPUMA (MRN [id number]) as of 06/11/2018 12:13  Ref. Range 06/11/2018 10:31  DLCO unc Latest Units: ml/min/mmHg 13.22  DLCO unc % pred Latest Units: % 74  Results for SHELLE, GALDAMEZ (MRN [id number]) as of 03/31/2018 16:37  Ref. Range 12/16/2017 19:24 12/17/2017 18:59 02/19/2018 10:53  Sed Rate Latest Ref Range: 0 - 30 mm/hr 76 (H) 69 (H) 18  Results for YARNELL, KOZLOSKI (MRN [id number]) as of 03/31/2018 16:37  Ref. Range 02/19/2018 10:53 02/19/2018 11:03  Anti Nuclear Antibody(ANA) Latest Ref Range: Negative   Negative  Anti JO-1 Latest Ref Range: 0.0 -  0.9 AI  <0.2  ds DNA Ab Latest Units: IU/mL <1     CT chest high resolution Aug 2020 COMPARISON:  03/11/2018, 12/17/2017  FINDINGS: Cardiovascular: Aortic atherosclerosis. Normal heart size. Scattered coronary artery calcifications. No pericardial effusion.  Mediastinum/Nodes: No enlarged mediastinal, hilar, or axillary lymph nodes. Thyroid gland, trachea, and esophagus demonstrate no significant findings.  Lungs/Pleura: There is a mild pattern of pulmonary fibrosis with an apical to basal gradient featuring mild tubular bronchiectasis, irregular peripheral interstitial opacity with some superimposed ground-glass, and some evidence of bronchiolectasis in the lung bases. There is no overt honeycombing. No significant air trapping on expiratory phase imaging. There is a densely calcified benign pulmonary nodule of the left lung base. No pleural effusion or pneumothorax.  IMPRESSION: 1. There is a mild pattern of pulmonary fibrosis with an apical to basal gradient featuring mild tubular bronchiectasis, irregular peripheral interstitial opacity with some superimposed ground-glass, and some evidence of bronchiolectasis in the lung bases. There is no overt honeycombing. No significant air trapping on expiratory phase imaging. Findings are not significantly changed compared to prior examination and consistent with an "indeterminate for UIP" pattern fibrosis by ATS pulmonary fibrosis criteria.  2.  Aortic atherosclerosis and coronary artery disease.   Electronically Signed   By: Eddie Candle M.D.   On: 12/10/2018 12:13   ROS - per HPI     has a past medical history of Acute respiratory failure ([place]), CAP (community acquired pneumonia), Hyperlipidemia, Hypertension, and Metabolic syndrome.   reports that she has quit smoking. She has never used smokeless tobacco.  Past Surgical History:  Procedure Laterality Date  . CESAREAN SECTION    . COLONOSCOPY N/A 06/07/2019    Procedure: COLONOSCOPY;  Surgeon: Danie Binder, MD;  [facility];  Service: Endoscopy;  Laterality: N/A;  12:45  . POLYPECTOMY  06/07/2019   Procedure: POLYPECTOMY;  Surgeon: Danie Binder, MD;  Location: [facility];  Service: Endoscopy;;    Allergies  Allergen Reactions  . Hydromet [Hydrocodone-Homatropine]     Pt doesn't remember  . Toprol Xl [Metoprolol] Hives    Facial rash, itching, hives  . Amoxicillin Rash    Did it involve swelling of the face/tongue/throat, SOB, or low BP? No Did it involve sudden or severe rash/hives, skin peeling, or any reaction on the inside of your mouth or nose? Unknown Did you need to seek medical attention at a hospital or doctor's office? Unknown When did it last happen?10 + years If all above answers are "NO", may proceed with cephalosporin use.     Immunization History  Administered Date(s) Administered  . Fluad Quad(high Dose 65+) 03/01/2019  . Influenza, High Dose Seasonal PF 03/11/2017, 03/09/2018  . Influenza,inj,Quad PF,6+ Mos 02/04/2014, 02/09/2015, 02/20/2016  . Moderna SARS-COVID-2 Vaccination 05/21/2019, 06/18/2019  . Pneumococcal Conjugate-13 08/10/2015  . Pneumococcal Polysaccharide-23 06/17/2017, 12/21/2017    Family History  Problem Relation Age of Onset  . Cancer Father   . Healthy Brother   . Healthy Daughter   . Healthy Sister   . Memory loss Sister   . Healthy Son   . Healthy Son      Current Outpatient Medications:  .  amLODipine (NORVASC) 5 MG tablet, TAKE (1) TABLET EVERY NIGHT AT BEDTIME., Disp: 90 tablet, Rfl: 0 .  aspirin EC 81 MG tablet, Take 1 tablet (81 mg total) by mouth daily., Disp: 90 tablet, Rfl: 3 .  atorvastatin (LIPITOR) 20 MG tablet, Take 1 tablet (20 mg total) by  mouth daily at 6 PM., Disp: 90 tablet, Rfl: 3 .  Carboxymethylcellul-Glycerin (LUBRICATING EYE DROPS OP), Place 1 drop into both eyes daily as needed (dry eyes)., Disp: , Rfl:  .  cetirizine (ZYRTEC) 10 MG  tablet, Take 1 tablet (10 mg total) by mouth daily as needed for allergies., Disp: 90 tablet, Rfl: 3 .  hydrochlorothiazide (HYDRODIURIL) 25 MG tablet, Take 1 tablet (25 mg total) by mouth daily., Disp: 90 tablet, Rfl: 3 .  ibuprofen (ADVIL) 200 MG tablet, Take 400 mg by mouth every 6 (six) hours as needed for headache or moderate pain., Disp: , Rfl:  .  Ipratropium-Albuterol (COMBIVENT RESPIMAT) 20-100 MCG/ACT AERS respimat, Inhale 1 puff into the lungs every 6 (six) hours as needed for wheezing., Disp: 4 g, Rfl: 5 .  levothyroxine (SYNTHROID) 50 MCG tablet, TAKE (1) TABLET DAILY BEFORE BREAKFAST., Disp: 90 tablet, Rfl: 0 .  losartan (COZAAR) 25 MG tablet, Take 1 tablet (25 mg total) by mouth daily., Disp: 90 tablet, Rfl: 3 .  Omega 3 1000 MG CAPS, Take 1,000 mg by mouth daily. , Disp: , Rfl:  .  pantoprazole (PROTONIX) 40 MG tablet, Take 1 tablet (40 mg total) by mouth daily., Disp: 90 tablet, Rfl: 3 .  diltiazem (CARDIZEM CD) 120 MG 24 hr capsule, Take 1 capsule (120 mg total) by mouth daily., Disp: 90 capsule, Rfl: 3      Objective:   Vitals:   12/07/19 1100  BP: 122/70  Pulse: 87  SpO2: 96%  Weight: 146 lb 3.2 oz (66.3 kg)  Height: _0  (1.575 m)    Estimated body mass index is 26.74 kg/m as calculated from the following:   Height as of this encounter: _1  (1.575 m).   Weight as of this encounter: 146 lb 3.2 oz (66.3 kg).  _2 @  [employer]   12/07/19 1100  Weight: 146 lb 3.2 oz (66.3 kg)     Physical Exam  General Appearance:    Alert, cooperative, no distress, appears stated age - yes , Deconditioned looking - no , OBESE  - no, Sitting on Wheelchair -  no  Head:    Normocephalic, without obvious abnormality, atraumatic  Eyes:    PERRL, conjunctiva/corneas clear,  Ears:    Normal TM's and external ear canals, both ears  Nose:   Nares normal, septum midline, mucosa normal, no drainage    or sinus tenderness. OXYGEN ON  - no . Patient is @ ra   Throat:    Lips, mucosa, and tongue normal; teeth and gums normal. Cyanosis on lips - no  Neck:   Supple, symmetrical, trachea midline, no adenopathy;    thyroid:  no enlargement/tenderness/nodules; no carotid   bruit or JVD  Back:     Symmetric, no curvature, ROM normal, no CVA tenderness  Lungs:     Distress - no , Wheeze no, Barrell Chest - no, Purse lip breathing - no, Crackles - fine at lung base   Chest Wall:    No tenderness or deformity.    Heart:    Regular rate and rhythm, S1 and S2 normal, no rub   or gallop, Murmur - no  Breast Exam:    NOT DONE  Abdomen:     Soft, non-tender, bowel sounds active all four quadrants,    no masses, no organomegaly. Visceral obesity - no  Genitalia:   NOT DONE  Rectal:   NOT DONE  Extremities:   Extremities - normal, Has Cane - no,  Clubbing - no, Edema - no  Pulses:   2+ and symmetric all extremities  Skin:   Stigmata of Connective Tissue Disease - no  Lymph nodes:   Cervical, supraclavicular, and axillary nodes normal  Psychiatric:  Neurologic:   Pleasant - yes, Anxious - no, Flat affect - no  CAm-ICU - neg, Alert and Oriented x 3 - yes, Moves all 4s - yes, Speech - normal, Cognition - intact           Assessment:       ICD-10-CM   1. Interstitial lung disease ([place])  J84.9   2. Shortness of breath  R06.02   3. Chronic cough  R05   4. History of environmental allergies  Z91.09    Clinically her interstitial lung disease appears stable based on original subjective history and also walking desaturation test on my physical exam.  However on the objective questionnaire her symptom scores are significantly worse.  I do not know if this is because of the way the questions are subjectively interpreted or there is an additional problem.  She reports environmental allergies.  Asthma probably might need to be ruled out.  Therefore instead of clinical follow-up will do investigations that includes allergy testing CBC and pulmonary function testing nitric oxide  testing and CT chest.  She, interpreter and daughter-in-law all agreement with the plan    Plan:     Patient Instructions     ICD-10-CM   1. Interstitial lung disease ([place])  J84.9   2. Shortness of breath  R06.02   3. Chronic cough  R05   4. History of environmental allergies  Z91.09     Do CBC with differential, blood IgE and Rast allergy panel  Do pulmonary function test pre and postbronchodilator  Do exhaled nitric oxide testing  Do high-resolution CT scan of the chest supine and prone  This test can be done anytime in the next few weeks to few months  Plan -Return to follow-up to see me or nurse practitioner in September 2021/October 2021 and a 30-minute slot on any day  -Bring interpreter with you     SIGNATURE    Dr. Brand Males, M.D., F.C.C.P,  Pulmonary and Critical Care Medicine Staff Physician, [place] Director - Interstitial Lung Disease  Program  Pulmonary [place] [place], [place], [address]  Pager: [phone], If no answer or between  15:00h - 7:00h: call [phone] Telephone: [phone]  11:45 AM 12/07/2019

## 2019-12-08 LAB — RESPIRATORY ALLERGY PROFILE REGION II ~~LOC~~
Allergen, A. alternata, m6: <0.1 kU/L
Allergen, Cedar tree, t12: <0.1 kU/L
Allergen, Comm Silver Birch, t9: <0.1 kU/L
Allergen, Cottonwood, t14: <0.1 kU/L
Allergen, D pternoyssinus,d7: <0.1 kU/L
Allergen, Mouse Urine Protein, e78: <0.1 kU/L
Allergen, Mulberry, t76: <0.1 kU/L
Allergen, Oak,t7: <0.1 kU/L
Allergen, P. notatum, m1: <0.1 kU/L
Aspergillus fumigatus, m3: <0.1 kU/L
Bermuda Grass: 0.16 kU/L — ABNORMAL HIGH
Box Elder IgE: <0.1 kU/L
CLADOSPORIUM HERBARUM (M2) IGE: <0.1 kU/L
COMMON RAGWEED (SHORT) (W1) IGE: <0.1 kU/L
Cat Dander: <0.1 kU/L
Class: 0
Class: 0
Class: 0
Class: 0
Class: 0
Class: 0
Class: 0
Class: 0
Class: 0
Class: 0
Class: 0
Class: 0
Class: 0
Class: 0
Class: 0
Class: 0
Class: 0
Class: 0
Class: 0
Class: 0
Class: 2
Class: 2
Class: 3
Cockroach: <0.1 kU/L
D. farinae: <0.1 kU/L
Dog Dander: <0.1 kU/L
Elm IgE: <0.1 kU/L
IgE (Immunoglobulin E), Serum: 103 kU/L (ref <=114)
Johnson Grass: 1.15 kU/L — ABNORMAL HIGH
Pecan/Hickory Tree IgE: 0.74 kU/L — ABNORMAL HIGH
Rough Pigweed  IgE: <0.1 kU/L
Sheep Sorrel IgE: <0.1 kU/L
Timothy Grass: 7.71 kU/L — ABNORMAL HIGH

## 2019-12-08 LAB — INTERPRETATION:

## 2019-12-10 ENCOUNTER — Other Ambulatory Visit: Payer: Self-pay

## 2019-12-10 ENCOUNTER — Ambulatory Visit (INDEPENDENT_AMBULATORY_CARE_PROVIDER_SITE_OTHER): Payer: Medicare Other | Admitting: Internal Medicine

## 2019-12-10 DIAGNOSIS — J849 Interstitial pulmonary disease, unspecified: Secondary | ICD-10-CM | POA: Diagnosis not present

## 2019-12-10 LAB — PULMONARY FUNCTION TEST
FEF 25-75 Post: 3.38 L/sec
FEF 25-75 Pre: 2.85 L/sec
FEF2575-%Change-Post: 18 %
FEF2575-%Pred-Post: 190 %
FEF2575-%Pred-Pre: 160 %
FEV1-%Change-Post: 3 %
FEV1-%Pred-Post: 99 %
FEV1-%Pred-Pre: 96 %
FEV1-Post: 2.01 L
FEV1-Pre: 1.94 L
FEV1FVC-%Change-Post: 0 %
FEV1FVC-%Pred-Pre: 119 %
FEV6-%Change-Post: 3 %
FEV6-%Pred-Post: 85 %
FEV6-%Pred-Pre: 82 %
FEV6-Post: 2.18 L
FEV6-Pre: 2.11 L
FEV6FVC-%Pred-Post: 104 %
FEV6FVC-%Pred-Pre: 104 %
FVC-%Change-Post: 2 %
FVC-%Pred-Post: 81 %
FVC-%Pred-Pre: 79 %
FVC-Post: 2.18 L
FVC-Pre: 2.13 L
Post FEV1/FVC ratio: 92 %
Post FEV6/FVC ratio: 100 %
Pre FEV1/FVC ratio: 91 %
Pre FEV6/FVC Ratio: 100 %

## 2019-12-10 NOTE — Progress Notes (Signed)
Pre/post spirometry with Albuterol performed today.

## 2019-12-11 DIAGNOSIS — H2513 Age-related nuclear cataract, bilateral: Secondary | ICD-10-CM | POA: Diagnosis not present

## 2019-12-11 DIAGNOSIS — H40033 Anatomical narrow angle, bilateral: Secondary | ICD-10-CM | POA: Diagnosis not present

## 2019-12-12 NOTE — Progress Notes (Signed)
IGe normal but blood allrgy test positive for various grass esp strongly positive for timothy grass. To be discussed at followup

## 2019-12-12 NOTE — Progress Notes (Signed)
Pft better than 1 year ago. Do rest of workup ahead of visit

## 2019-12-30 ENCOUNTER — Other Ambulatory Visit: Payer: Self-pay

## 2019-12-30 ENCOUNTER — Ambulatory Visit (HOSPITAL_COMMUNITY)
Admission: RE | Admit: 2019-12-30 | Discharge: 2019-12-30 | Disposition: A | Discharge status: Home / Self Care | Payer: Medicare Other | Source: Ambulatory Visit | Attending: Internal Medicine | Admitting: Internal Medicine

## 2019-12-30 DIAGNOSIS — J849 Interstitial pulmonary disease, unspecified: Secondary | ICD-10-CM | POA: Diagnosis not present

## 2019-12-30 DIAGNOSIS — J479 Bronchiectasis, uncomplicated: Secondary | ICD-10-CM | POA: Diagnosis not present

## 2019-12-30 DIAGNOSIS — J8410 Pulmonary fibrosis, unspecified: Secondary | ICD-10-CM | POA: Diagnosis not present

## 2019-12-30 DIAGNOSIS — I700 Atherosclerosis of aorta: Secondary | ICD-10-CM | POA: Diagnosis not present

## 2019-12-30 DIAGNOSIS — I2510 Atherosclerotic heart disease of native coronary artery without angina pectoris: Secondary | ICD-10-CM | POA: Diagnosis not present

## 2019-12-31 ENCOUNTER — Telehealth: Payer: Self-pay | Admitting: Internal Medicine

## 2019-12-31 NOTE — Telephone Encounter (Signed)
Attempted to call pt but unable to reach and unable to leave VM as no mailbox has been set up yet. Will try to call back later.

## 2019-12-31 NOTE — Telephone Encounter (Signed)
  Compare dto aug 2020  - CT suggests progression though pft sable  Plan  - per recent ov - return to see me first avail 30 min slot for face to face. Might need repeat bronch v SLB  CT Chest High Resolution  Result Date: 12/31/2019 CLINICAL DATA:  69 year old female with history of interstitial lung disease. Follow-up study. EXAM: CT CHEST WITHOUT CONTRAST TECHNIQUE: Multidetector CT imaging of the chest was performed following the standard protocol without intravenous contrast. High resolution imaging of the lungs, as well as inspiratory and expiratory imaging, was performed. COMPARISON:  Chest CT 12/10/2018. FINDINGS: Cardiovascular: Heart size is normal. There is no significant pericardial fluid, thickening or pericardial calcification. There is aortic atherosclerosis, as well as atherosclerosis of the great vessels of the mediastinum and the coronary arteries, including calcified atherosclerotic plaque in the left main, left anterior descending and right coronary arteries. Mediastinum/Nodes: No pathologically enlarged mediastinal or hilar lymph nodes. Please note that accurate exclusion of hilar adenopathy is limited on noncontrast CT scans. Esophagus is unremarkable in appearance. No axillary lymphadenopathy. Lungs/Pleura: Widespread areas of ground-glass attenuation, septal thickening, subpleural reticulation, cylindrical bronchiectasis and peripheral bronchiolectasis are noted throughout the lungs bilaterally. No definitive honeycombing confidently identified. These have a definitive craniocaudal gradient and appear minimally progressive compared to the prior study. Inspiratory and expiratory imaging is unremarkable. No acute consolidative airspace disease. No pleural effusions. No definite suspicious appearing pulmonary nodules or masses are noted. Calcified granuloma in the left lower lobe again incidentally noted. Upper Abdomen: Aortic atherosclerosis. Musculoskeletal: There are no aggressive  appearing lytic or blastic lesions noted in the visualized portions of the skeleton. IMPRESSION: 1. The appearance of the lungs is compatible with interstitial lung disease, with mild progression of changes compared to the prior study, with a spectrum of findings considered probable usual interstitial pneumonia (UIP) per current ATS guidelines. 2. Aortic atherosclerosis, in addition to left main and 2 vessel coronary artery disease. Please note that although the presence of coronary artery calcium documents the presence of coronary artery disease, the severity of this disease and any potential stenosis cannot be assessed on this non-gated CT examination. Assessment for potential risk factor modification, dietary therapy or pharmacologic therapy may be warranted, if clinically indicated. Aortic Atherosclerosis (ICD10-I70.0). Electronically Signed   By: Vinnie Langton M.D.   On: 12/31/2019 08:22

## 2019-12-31 NOTE — Progress Notes (Signed)
Possible ILD prgression. SEnt phone note as well. Needs to see me in 30 min slot first avail. Needs interpreteer

## 2020-01-01 NOTE — Telephone Encounter (Signed)
Called and spoke with pt's son Roselie Awkward about pt's CT results and stated that MR wants [place] to schedule her for an appt. Roselie Awkward verbalized understanding. appt has been scheduled for pt 10/5 at 3:30 with MR. Nothing further needed.

## 2020-01-14 ENCOUNTER — Encounter: Payer: Self-pay | Admitting: Family Medicine

## 2020-01-14 ENCOUNTER — Ambulatory Visit (INDEPENDENT_AMBULATORY_CARE_PROVIDER_SITE_OTHER): Payer: Medicare Other | Admitting: Family Medicine

## 2020-01-14 ENCOUNTER — Other Ambulatory Visit: Payer: Self-pay

## 2020-01-14 VITALS — BP 134/79 | HR 94 | Temp 97.7°F | Ht 62.0 in | Wt 143.2 lb

## 2020-01-14 DIAGNOSIS — I208 Other forms of angina pectoris: Secondary | ICD-10-CM | POA: Diagnosis not present

## 2020-01-14 DIAGNOSIS — Z23 Encounter for immunization: Secondary | ICD-10-CM

## 2020-01-14 DIAGNOSIS — R319 Hematuria, unspecified: Secondary | ICD-10-CM | POA: Diagnosis not present

## 2020-01-14 LAB — URINALYSIS, ROUTINE W REFLEX MICROSCOPIC
Bilirubin, UA: NEGATIVE
Glucose, UA: NEGATIVE
Ketones, UA: NEGATIVE
Nitrite, UA: POSITIVE — AB
Protein,UA: NEGATIVE
Specific Gravity, UA: 1.01 (ref 1.005–1.030)
Urobilinogen, Ur: 0.2 mg/dL (ref 0.2–1.0)
pH, UA: 7 (ref 5.0–7.5)

## 2020-01-14 LAB — MICROSCOPIC EXAMINATION: WBC, UA: >30 /hpf — AB (ref 0–5)

## 2020-01-14 MED ORDER — CEPHALEXIN 500 MG PO CAPS: 500.0000 mg | ORAL_CAPSULE | Freq: Four times a day (QID) | ORAL | 0 refills | Status: DC

## 2020-01-14 NOTE — Progress Notes (Signed)
BP 134/79   Pulse 94   Temp 97.7 F (36.5 C)   Ht 5\' 2"  (1.575 m)   Wt 143 lb 3.2 oz (65 kg)   SpO2 96%   BMI 26.19 kg/m    Subjective:   [name], female    DOB: 1950/12/09, 69 y.o.   MRN: [id number]  HPI: Tasha [name] is a 69 y.o. female presenting on 01/14/2020 for Hematuria   HPI Patient is coming in complaining of constipation has been going on for a week and now she started having dysuria and urinary frequency and burning and abdominal pain that is coming and going and not there all the time but has come and gone over the past week.  She is also saying that she wakes up every 2 hours at night with urination but she cannot urinate much but she just feels like she needs to go.  Relevant past medical, surgical, family and social history reviewed and updated as indicated. Interim medical history since our last visit reviewed. Allergies and medications reviewed and updated.  Review of Systems  Constitutional: Negative for chills and fever.  Eyes: Negative for visual disturbance.  Respiratory: Negative for chest tightness and shortness of breath.   Cardiovascular: Negative for chest pain and leg swelling.  Gastrointestinal: Positive for abdominal pain and constipation.  Genitourinary: Positive for dysuria, frequency and urgency. Negative for difficulty urinating, hematuria, vaginal bleeding, vaginal discharge and vaginal pain.  Musculoskeletal: Negative for back pain and gait problem.  Skin: Negative for rash.  Neurological: Negative for light-headedness and headaches.  Psychiatric/Behavioral: Negative for agitation and behavioral problems.  All other systems reviewed and are negative.   Per HPI unless specifically indicated above   Objective:   BP 134/79   Pulse 94   Temp 97.7 F (36.5 C)   Ht 5\' 2"  (1.575 m)   Wt 143 lb 3.2 oz (65 kg)   SpO2 96%   BMI 26.19 kg/m   Wt Readings from Last 3 Encounters:  01/14/20 143 lb 3.2 oz (65 kg)    12/07/19 146 lb 3.2 oz (66.3 kg)  10/18/19 142 lb (64.4 kg)    Physical Exam Vitals and nursing note reviewed.  Constitutional:      General: She is not in acute distress.    Appearance: She is well-developed. She is not diaphoretic.  Eyes:     Conjunctiva/sclera: Conjunctivae normal.  Cardiovascular:     Rate and Rhythm: Normal rate and regular rhythm.     Heart sounds: Normal heart sounds. No murmur heard.   Pulmonary:     Effort: Pulmonary effort is normal. No respiratory distress.     Breath sounds: Normal breath sounds. No wheezing.  Abdominal:     General: Bowel sounds are normal. There is no distension.     Palpations: Abdomen is soft. Abdomen is not rigid. There is no mass.     Tenderness: There is no abdominal tenderness. There is no right CVA tenderness, left CVA tenderness, guarding or rebound.  Skin:    General: Skin is warm and dry.     Findings: No rash.  Neurological:     Mental Status: She is alert and oriented to person, place, and time.     Coordination: Coordination normal.  Psychiatric:        Behavior: Behavior normal.       Assessment & Plan:   Problem List Items Addressed This Visit    None    Visit Diagnoses  Hematuria, unspecified type    -  Primary   Relevant Medications   cephALEXin (KEFLEX) 500 MG capsule   Other Relevant Orders   Urinalysis, Routine w reflex microscopic   Urine Culture   Need for immunization against influenza       Relevant Orders   Flu Vaccine QUAD High Dose(Fluad) (Completed)    Patient unable to leave urine, she will try and come back later and see if she can leave it.  Follow up plan: Return if symptoms worsen or fail to improve.  Counseling provided for all of the vaccine components Orders Placed This Encounter  Procedures  . Flu Vaccine QUAD High Dose(Fluad)  . Urinalysis, Routine w reflex microscopic    Caryl Pina, MD [facility] 01/14/2020, 9:44 AM

## 2020-01-17 LAB — URINE CULTURE

## 2020-01-19 ENCOUNTER — Ambulatory Visit: Payer: Medicare Other | Admitting: Family Medicine

## 2020-01-19 ENCOUNTER — Other Ambulatory Visit: Payer: Self-pay | Admitting: Family Medicine

## 2020-01-19 ENCOUNTER — Encounter: Payer: Self-pay | Admitting: Family Medicine

## 2020-01-25 ENCOUNTER — Other Ambulatory Visit: Payer: Self-pay

## 2020-01-25 ENCOUNTER — Ambulatory Visit (INDEPENDENT_AMBULATORY_CARE_PROVIDER_SITE_OTHER): Payer: Medicare Other | Admitting: Internal Medicine

## 2020-01-25 ENCOUNTER — Encounter: Payer: Self-pay | Admitting: Internal Medicine

## 2020-01-25 VITALS — BP 124/70 | HR 90 | Temp 98.4°F | Ht 65.0 in | Wt 143.6 lb

## 2020-01-25 DIAGNOSIS — Z9109 Other allergy status, other than to drugs and biological substances: Secondary | ICD-10-CM | POA: Diagnosis not present

## 2020-01-25 DIAGNOSIS — R053 Chronic cough: Secondary | ICD-10-CM | POA: Diagnosis not present

## 2020-01-25 DIAGNOSIS — Z7185 Encounter for immunization safety counseling: Secondary | ICD-10-CM

## 2020-01-25 DIAGNOSIS — J849 Interstitial pulmonary disease, unspecified: Secondary | ICD-10-CM

## 2020-01-25 DIAGNOSIS — R0602 Shortness of breath: Secondary | ICD-10-CM

## 2020-01-25 DIAGNOSIS — I208 Other forms of angina pectoris: Secondary | ICD-10-CM | POA: Diagnosis not present

## 2020-01-25 MED ORDER — BUDESONIDE 180 MCG/ACT IN AEPB: 2.0000 | INHALATION_SPRAY | Freq: Two times a day (BID) | RESPIRATORY_TRACT | 3 refills | Status: DC

## 2020-01-25 NOTE — Progress Notes (Signed)
OV 01/22/18 69 year old female, never smoked. PMH  Hypertension, metabolic syndrome, thrombocytopenia, prediabetes, interstitial lung disease, PAF. Not previously followed by [place] pulmonary care. Admitted to [facility] from outside hospital on 08/27 with acute hypoxic respiratory failure in setting of bilateral pulmonary infiltrates. Work up indicated [place]. Dx subacute ILD vs pneumonia thought to be from exposure to antifreeze leak in patients car. Requiring intubation from 08/30-09/05, chest tube placed due to pneumothorax. [facility]- 09/20, required min assistance with mobility and basic self-care tasks. Speech therapy worked on dysphagia, able to tolerate regular diet thin liquids without signs and symptoms of aspiration.    Patient presents today for hospital follow-up visit. Accompanied by her daughter who also provides translation. She is feeling a lot better, no breathing issues. Finished oral steroid today. BS 119. CBC improving. Eating and drinking ok. No difficulties swallowing and denies aspiration. Ambulting with walker. Denies fall, sob, wheezing, cough, fever or chest pain.    Results for Tasha [name] (MRN [id number]) as of 02/19/2018 10:01  Ref. Range 12/16/2017 19:24 12/17/2017 18:59  Sed Rate Latest Ref Range: 0 - 22 mm/hr 76 (H) 69 (H)  Results for Tasha, [name] (MRN [id number]) as of 02/19/2018 10:01  Ref. Range 12/17/2017 18:59  Speckled Pattern Unknown 1:320 (H)  Results for Tasha, [name] (MRN [id number]) as of 02/19/2018 10:01  Ref. Range 12/19/2017 14:15  Color, Fluid Latest Ref Range: YELLOW  COLORLESS (A)  WBC, Fluid Latest Ref Range: 0 - 1,000 cu mm 93  Lymphs, Fluid Latest Units: % 78  Eos, Fluid Latest Units: % 2  Appearance, Fluid Latest Ref Range: CLEAR  CLEAR (A)  Neutrophil Count, Fluid Latest Ref Range: 0 - 25 % 7  Monocyte-Macrophage-Serous Fluid Latest Ref Range: 50 - 90 % 13 (L)     OV  02/19/2018  Subjective:  [name], female , DOB: July 13, 1950 , age 69 y.o. , MRN: [id number] , ADDRESS: [address] [place] [address]   02/19/2018 -   Chief Complaint  Patient presents with  . Follow-up    States she still has dry cough, states the SOB has resolved. Denies chest pain or idscomfort.      HPI Tasha [name] 69 y.o. -accompanied by her daughter Tasha [name].  End of August 2019 she suffered acute lung injury possibly Boop based on the nature of infiltrates and a high ESR and a positive ANA [BAL lymphocytosis].  There is some history of antifreeze exposure.  She has recovered from it and is now at home.  She is brought in by Dr. Verdis [name].  Tasha [name] is doing the translation.  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  Post discharge in the hospital patient is off oxygen and is overall better.  Steroids ended 2 weeks ago but since the completion of steroids she feels the cough is coming back and getting worse.  Currently cough is moderate in intensity and without any sputum production.  2 days ago her primary care physician apparently changed 1 of her antihypertensives because of the cough.  I am unable to determine which one but I suspect it was an ACE inhibitor.  She is currently on losartan.  There is no fever or chills.  Current walking desaturation test appears adequate        ROS  OV 03/31/2018  Subjective:  [name], female , DOB: [birth year] , age 69  y.o. , MRN: [id number] , ADDRESS: [place] [address]   03/31/2018 -   Chief Complaint  Patient presents with  . Follow-up    pt states breathing is doing well. c/o non prod cough.     HPI Tasha [name] 69 y.o. -presents for interstitial lung disease acute with lymphocytic bronchoalveolar lavage after antifreeze exposure  After last visit she showed improvement.  We repeated ANA and this was negative.  We also did some  extra antibody such as anti-Jo 1 and this was negative.  Her ESR has normalized.  In the interim she continues to improve.  Shortness of breath is almost nonexistent.  Cough is improved but she still has mild residual cough.  She had high-resolution CT scan of the chest #2019 that I personally visualized and there is still some residual groundglass opacities along with some evolving fibrotic pattern.  It seems more predominant in the lung base.  Her her son Tasha [name] is with her.  History is gained from talking to interpreter on the video camera.  His name is Tasha [name].  She denies any diabetes or hyperlipidemia or stroke in the past.  She does not have any mood swings.  In the past she has tolerated prednisone without problem.  Denies any bony issues.   OV 06/11/2018  Subjective:  [name], female , DOB: [birth year] , age 69 y.o. , MRN: [id number] , ADDRESS: [address] [place] [address]  Tasha [name] 69 y.o. -presents for interstitial lung disease acute with lymphocytic bronchoalveolar lavage after antifreeze exposure 06/11/2018 -   Chief Complaint  Patient presents with  . Follow-up    PFT performed today.  Pt states she has been doing well since last visit and denies any complaints.      HPI Tasha [name] 69 y.o. -follow-up.  At last visit in December 2019 I placed her on a 10-week prednisone course.  In between she came and saw a nurse practitioner approximately just over a month ago.  She was tolerating the prednisone fine.  It is now 4 weeks and she finished the prednisone.  According to the daughter who acted as the interpreter the prednisone really did help with her symptoms.  It improved her shortness of breath.  Currently she is nearly asymptomatic.  Her current symptoms are documented below.  She is feeling good.  She had pulmonary function test today with spirometry being normal and DLCO being near normal.  She is noted to be on ACE inhibitor but she denies any cough.   Last CT scan of the chest was November 2019    OV 12/07/2019   Subjective:  [name], female , DOB: [birth year], age 69 y.o. years. , MRN: [id number],  ADDRESS: [place] [address] PCP  Dettinger, Fransisca Kaufmann, MD Providers : Treatment Team:  Attending Provider: Brand Males, MD presents for interstitial lung disease acute with lymphocytic bronchoalveolar lavage after antifreeze exposure in aug 2019  Chief Complaint  Patient presents with  . Follow-up    Last seen 06/11/2018. Pt states she has been about the same since last visit. Pt is still having complaints of cough and is coughing up occ white phlegm. Pt also has increased SOB when she is in enclosed areas that makes her feel like she cannot breathe.       HPI Tasha [name] 69 y.o. -presents with her daughter-in-law is for a face-to-face visit.  The interpreter is Mickel Baas.  Mickel Baas is from  [place].  It appears that overall since her last visit in February 2020 patient is stable.  In August 2020 she had a CT scan of the chest that shows presence of mild ongoing ILD.  She tells [place] that she is had the Covid vaccine.  She has been exercising at the [facility].  She does not use oxygen.  She has mild shortness of breath with exertion when she bends down and some mild cough but overall compared to 2019 she is slowly progressively better.  She does have some dry cough when she is sitting as well.  She needs to drink water to improve that.  There are no other new medical issues.  Her walking desaturation test is unchanged compared to February 2020.   however when she did the interstitial lung disease symptom questionnaire symptoms are significantly worse than before.  She really cannot pinpoint if she is definitely worse or not.  She did admit that she has significant environmental allergies to pollen dust and perfumes.  This is a new history.   CT chest high resolution Aug 2020 COMPARISON:  03/11/2018,  12/17/2017  FINDINGS: Cardiovascular: Aortic atherosclerosis. Normal heart size. Scattered coronary artery calcifications. No pericardial effusion.  Mediastinum/Nodes: No enlarged mediastinal, hilar, or axillary lymph nodes. Thyroid gland, trachea, and esophagus demonstrate no significant findings.  Lungs/Pleura: There is a mild pattern of pulmonary fibrosis with an apical to basal gradient featuring mild tubular bronchiectasis, irregular peripheral interstitial opacity with some superimposed ground-glass, and some evidence of bronchiolectasis in the lung bases. There is no overt honeycombing. No significant air trapping on expiratory phase imaging. There is a densely calcified benign pulmonary nodule of the left lung base. No pleural effusion or pneumothorax.  IMPRESSION: 1. There is a mild pattern of pulmonary fibrosis with an apical to basal gradient featuring mild tubular bronchiectasis, irregular peripheral interstitial opacity with some superimposed ground-glass, and some evidence of bronchiolectasis in the lung bases. There is no overt honeycombing. No significant air trapping on expiratory phase imaging. Findings are not significantly changed compared to prior examination and consistent with an "indeterminate for UIP" pattern fibrosis by ATS pulmonary fibrosis criteria.  2.  Aortic atherosclerosis and coronary artery disease.   Electronically Signed   By: Eddie Candle M.D.   On: 12/10/2018 12:13  Results for Tasha, [name] (MRN [id number]) as of 06/11/2018 12:13  Ref. Range 06/11/2018 10:31  FVC-Pre Latest Units: L 2.05  FVC-%Pred-Pre Latest Units: % 74  FEV1-Pre Latest Units: L 1.88  FEV1-%Pred-Pre Latest Units: % 90  Pre FEV1/FVC ratio Latest Units: % 92   Results for Tasha, [name] (MRN [id number]) as of 06/11/2018 12:13  Ref. Range 06/11/2018 10:31  DLCO unc Latest Units: ml/min/mmHg 13.22  DLCO unc % pred Latest Units: % 74  Results for Tasha, [name] (MRN [id number]) as of 03/31/2018 16:37  Ref. Range 12/16/2017 19:24 12/17/2017 18:59 02/19/2018 10:53  Sed Rate Latest Ref Range: 0 - 30 mm/hr 76 (H) 69 (H) 18  Results for Tasha, [name] (MRN [id number]) as of 03/31/2018 16:37  Ref. Range 02/19/2018 10:53 02/19/2018 11:03  Anti Nuclear Antibody(ANA) Latest Ref Range: Negative   Negative  Anti JO-1 Latest Ref Range: 0.0 - 0.9 AI  <0.2  ds DNA Ab Latest Units: IU/mL <1   Results for Tasha, [name] (MRN [id number]) as of 01/25/2020 16:46  Ref. Range 11/25/2018 09:57 01/25/2019 09:31  Anti Nuclear Antibody (ANA) Latest Ref Range: NEGATIVE   POSITIVE (A)  ANA Pattern 1 Unknown  Nuclear, Nucleolar (  A)  ANA Titer 1 Latest Units: titer Positive (A) 2:751 (H)  Cyclic Citrullin Peptide Ab Latest Units: UNITS 3 <16  ds DNA Ab Latest Units: IU/mL  <1  RA Latex Turbid. Latest Ref Range: <14 IU/mL 10.9 <14  ENA SM Ab Ser-aCnc Latest Ref Range: <1.0 NEG AI  <1.0 NEG  Homogeneous Pattern Unknown 1:160 (H)   Speckled Pattern Unknown 1:320 (H)      OV 01/25/2020   Subjective:  [name], female , DOB: 1950/07/09, age 56 y.o. years. , MRN: [id number],  ADDRESS: [place] [address] PCP  Dettinger, Fransisca Kaufmann, MD Providers : Treatment Team:  Attending Provider: Brand Males, MD   Chief Complaint  Patient presents with  . Follow-up    cough decreased     August 2019 she suffered acute lung injury possibly Boop based on the nature of infiltrates and a high ESR and a positive ANA [BAL lymphocytosis].  There is some history of antifreeze exposure. -> ILD  Hx of env allergies - blood allergy test Aug 2021 - igE normal / but positive for Grass  Intermittent ANA positive - last +ve late 2020   HPI Tasha [name] 69 y.o. -presents with her daughter in law Early Chars and also the interpreter Vicente Males.  This visit has been scheduled because last visit she had increased symptoms.  Therefore we did a high-resolution CT  chest Dr. Weber Cooks the radiologist thinks the slight progression.  However we also repeated pulmonary function test improvement/stabilization.  In terms of symptoms patient tells me her symptoms are actually improved.  ILD symptom score actually shows improvement.  She attributes this to exercise.  We did walking desaturation test and it is stable.  Glucotrol documented below.  He understands that she has pulmonary fibrosis.  In fact she says that she looked at [employer] and got extremely worried about pulmonary fibrosis.  At this point in time she denies any symptoms of connective tissue disease.  Overall she is stable.  She continues to have cough.  We did blood allergy work-up and is positive for grass mold IgE is normal.  She was supposed to exam nitric oxide test but this has not been done.  She has had a Covid vaccine and flu shot.  She had to have a booster counseled her about that.   SYMPTOM SCALE - ILD 06/11/2018  12/07/2019  01/25/2020 Last Weight  Most recent update: 01/25/2020  4:14 PM   Weight  65.1 kg (143 lb 9.6 oz)             O2 use ra ra ra  Shortness of Breath 0 -> 5 scale with 5 being worst (score 6 If unable to do)    At rest 0 0 0  Simple tasks - showers, clothes change, eating, shaving 0 3 0  Household (dishes, doing bed, laundry) 0 3 3  Shopping _0 Walking level at own pace 0 4 0  Walking stairs 0 6 4  Total (40 - 48) Dyspnea Score _1 How bad is your cough? 0 5 3  How bad is your fatigue _2 nausea  0 0  vomit  0 0  diarrhea  0 0  anzity  3 3  Dep[resssio  meds 0      Simple office walk 185 feet x  3 laps goal with forehead probe 02/19/2018  03/31/2018  06/11/2018  12/07/2019  01/25/2020   O2 used  rooom air Room air Room air ra ra  Number laps completed 3 3 x 250 feet 3 x 236 feet 3 laops 3  Comments about pace Moderate pace Mod pace Normal pace avg pace   Resting Pulse Ox/HR 99% and 71/min 100% and 89/min 100% and 83/mn 100% and 83/min 99% and  99/min  Final Pulse Ox/HR 95% and 92/min 98% and 111 98% and 101/min 98% and 101/min 98% and 107/min  Desaturated </= 88% _0   Desaturated <= 3% points yes no no no none  Got Tachycardic >/= 90/min _1   Symptoms at end of test none none none Mild dyspnea none  Miscellaneous comments x x x       PFT Results Latest Ref Rng & Units 12/10/2019 06/11/2018  FVC-Pre L 2.13 2.05  FVC-Predicted Pre % 79 74  FVC-Post L 2.18 -  FVC-Predicted Post % 81 -  Pre FEV1/FVC % % 91 92  Post FEV1/FCV % % 92 -  FEV1-Pre L 1.94 1.88  FEV1-Predicted Pre % 96 90  FEV1-Post L 2.01 -  DLCO uncorrected ml/min/mmHg - 13.22  DLCO UNC% % - 74  DLVA Predicted % - 95   IMPRESSION: 1. The appearance of the lungs is compatible with interstitial lung disease, with mild progression of changes compared to the prior study, with a spectrum of findings considered probable usual interstitial pneumonia (UIP) per current ATS guidelines. 2. Aortic atherosclerosis, in addition to left main and 2 vessel coronary artery disease. Please note that although the presence of coronary artery calcium documents the presence of coronary artery disease, the severity of this disease and any potential stenosis cannot be assessed on this non-gated CT examination. Assessment for potential risk factor modification, dietary therapy or pharmacologic therapy may be warranted, if clinically indicated.  Aortic Atherosclerosis (ICD10-I70.0).   Electronically Signed   By: Vinnie Langton M.D.   On: 12/31/2019 08:22  ROS - per HPI   No results found for: NITRICOXIDE     has a past medical history of Acute respiratory failure ([place]), CAP (community acquired pneumonia), Hyperlipidemia, Hypertension, and Metabolic syndrome.   reports that she has quit smoking. She has never used smokeless tobacco.  Past Surgical History:  Procedure Laterality Date  . CESAREAN SECTION    . COLONOSCOPY N/A 06/07/2019    Procedure: COLONOSCOPY;  Surgeon: Danie Binder, MD;  Location: [facility];  Service: Endoscopy;  Laterality: N/A;  12:45  . POLYPECTOMY  06/07/2019   Procedure: POLYPECTOMY;  Surgeon: Danie Binder, MD;  Location: [facility];  Service: Endoscopy;;    Allergies  Allergen Reactions  . Hydromet [Hydrocodone-Homatropine]     Pt doesn't remember  . Toprol Xl [Metoprolol] Hives    Facial rash, itching, hives  . Amoxicillin Rash    Did it involve swelling of the face/tongue/throat, SOB, or low BP? No Did it involve sudden or severe rash/hives, skin peeling, or any reaction on the inside of your mouth or nose? Unknown Did you need to seek medical attention at a hospital or doctor's office? Unknown When did it last happen?10 + years If all above answers are "NO", may proceed with cephalosporin use.     Immunization History  Administered Date(s) Administered  . Fluad Quad(high Dose 65+) 03/01/2019, 01/14/2020  . Influenza, High Dose Seasonal PF 03/11/2017, 03/09/2018  . Influenza,inj,Quad PF,6+ Mos 02/04/2014, 02/09/2015, 02/20/2016  . Moderna SARS-COVID-2 Vaccination 05/21/2019, 06/18/2019  . Pneumococcal Conjugate-13 08/10/2015  .  Pneumococcal Polysaccharide-23 06/17/2017, 12/21/2017    Family History  Problem Relation Age of Onset  . Cancer Father   . Healthy Brother   . Healthy Daughter   . Healthy Sister   . Memory loss Sister   . Healthy Son   . Healthy Son      Current Outpatient Medications:  .  amLODipine (NORVASC) 5 MG tablet, TAKE (1) TABLET EVERY NIGHT AT BEDTIME., Disp: 90 tablet, Rfl: 0 .  aspirin EC 81 MG tablet, Take 1 tablet (81 mg total) by mouth daily., Disp: 90 tablet, Rfl: 3 .  atorvastatin (LIPITOR) 20 MG tablet, Take 1 tablet (20 mg total) by mouth daily at 6 PM., Disp: 90 tablet, Rfl: 3 .  Carboxymethylcellul-Glycerin (LUBRICATING EYE DROPS OP), Place 1 drop into both eyes daily as needed (dry eyes)., Disp: , Rfl:  .  cetirizine (ZYRTEC) 10  MG tablet, Take 1 tablet (10 mg total) by mouth daily as needed for allergies., Disp: 90 tablet, Rfl: 3 .  hydrochlorothiazide (HYDRODIURIL) 25 MG tablet, Take 1 tablet (25 mg total) by mouth daily., Disp: 90 tablet, Rfl: 3 .  ibuprofen (ADVIL) 200 MG tablet, Take 400 mg by mouth every 6 (six) hours as needed for headache or moderate pain., Disp: , Rfl:  .  Ipratropium-Albuterol (COMBIVENT RESPIMAT) 20-100 MCG/ACT AERS respimat, Inhale 1 puff into the lungs every 6 (six) hours as needed for wheezing., Disp: 4 g, Rfl: 5 .  levothyroxine (SYNTHROID) 50 MCG tablet, TAKE (1) TABLET DAILY BEFORE BREAKFAST., Disp: 90 tablet, Rfl: 0 .  losartan (COZAAR) 25 MG tablet, Take 1 tablet (25 mg total) by mouth daily., Disp: 90 tablet, Rfl: 3 .  Omega 3 1000 MG CAPS, Take 1,000 mg by mouth daily. , Disp: , Rfl:  .  diltiazem (CARDIZEM CD) 120 MG 24 hr capsule, Take 1 capsule (120 mg total) by mouth daily., Disp: 90 capsule, Rfl: 3 .  pantoprazole (PROTONIX) 40 MG tablet, Take 1 tablet (40 mg total) by mouth daily. (Patient not taking: Reported on 01/25/2020), Disp: 90 tablet, Rfl: 3      Objective:   Vitals:   01/25/20 1612  BP: 124/70  Pulse: 90  Temp: 98.4 F (36.9 C)  TempSrc: Temporal  SpO2: 98%  Weight: 143 lb 9.6 oz (65.1 kg)  Height: _0  (1.651 m)    Estimated body mass index is 23.9 kg/m as calculated from the following:   Height as of this encounter: _1  (1.651 m).   Weight as of this encounter: 143 lb 9.6 oz (65.1 kg).  _2 @  Filed Weights   01/25/20 1612  Weight: 143 lb 9.6 oz (65.1 kg)     Physical Exam  General: No distress. Looks well Neuro: Alert and Oriented x 3. GCS 15. Speech normal Psych: Pleasant Resp:  Barrel Chest - n.  Wheeze - no, Crackles - mild at base, No overt respiratory distress CVS: Normal heart sounds. Murmurs - no Ext: Stigmata of Connective Tissue Disease - no HEENT: Normal upper airway. PEERL +. No post nasal drip        Assessment:        ICD-10-CM   1. Interstitial lung disease ([place])  J84.9   2. History of environmental allergies  Z91.09   3. Shortness of breath  R06.02   4. Chronic cough  R05.3   5. Vaccine counseling  Z71.85    She has interstitial lung disease/pulmonary fibrosis.  Have counseled her about this.  Explained to the and  the daughter-in-law and the interpreter that this happened acute injury situation.  We explained the goals to be stable.  Did express that currently getting conflicting signals between symptoms of breathing test and pulmonary function test and CT scan progression of stability.  Expressed that she has not had resolution for sure.  If there is progression it is only minimal but overall I think she is stable.  Therefore we have agreed to have an expectant approach instead of doing a biopsy right now.  However we will continue to have close follow-up and will see her in 3 to 4 months.  Certainly if there is progression then we should consider biopsy but definitely antifibrotic.  Explained to her that triggers to be outside antigen exposure such as viruses but also could be autoimmune reasons.  Previously ANA positive therefore we will repeat her autoimmune profile panel.  Certainly these are positive and strongly positive might have to have her come in sooner to entertain antifibrotic therapy sooner    Plan:     Patient Instructions     ICD-10-CM   1. Interstitial lung disease ([place])  J84.9   2. History of environmental allergies  Z91.09   3. Shortness of breath  R06.02   4. Chronic cough  R05.3   5. Vaccine counseling  Z71.85     Shortness of breath Chronic cough Interstitial lung disease ([facility])   - -BAL lymphocytosis August 2019 following antifreeze exposure and ANA positivity History of environmental allergies   -Interstitial lung disease otherwise call pulmonary fibrosis is still there.  Based on his symptoms, walking desaturation test on pulmonary function test things are stable but  on the CT scan of the chest may be there is a little progression  -Overall plan at this rate is to keep a close eye on his situation -if there is any progression at any time then we will discuss biopsy versus starting an antifibrotic  Plan -Repeat ANA, ACE ESR, SCL-70, SSA, SSB and double-stranded DNA, rheumatoid factor and CCP   -We can inform you of results -Repeat spirometry and DLCO in 3 to 4 months  -Try inhaler Pulmicort 2 puffs 2 times daily schedule with  combivent as needed -to see if your cough improves  Vaccine counseling  -Recommend Covid booster vaccine  Follow-up -3-4 months with Dr. Chase Caller 30-minute slot but after spirometry and DLCO  -ILD symptom score and simple walking desaturation test at the time of follow-up  -  Interpreter needed at follow-up    ( Level 05 visit: Estb 40-54 min   in  visit type: on-site physical face to visit  in total care time and counseling or/and coordination of care by this undersigned MD - Dr Brand Males. This includes one or more of the following on this same day 01/25/2020: pre-charting, chart review, note writing, documentation discussion of test results, diagnostic or treatment recommendations, prognosis, risks and benefits of management options, instructions, education, compliance or risk-factor reduction. It excludes time spent by the [place] or office staff in the care of the patient. Actual time 40 min)    SIGNATURE    Dr. Brand Males, M.D., F.C.C.P,  Pulmonary and Critical Care Medicine Staff Physician, [place] Director - Interstitial Lung Disease  Program  Pulmonary [place] [facility] [place], [place], [address]  Pager: [phone], If no answer or between  15:00h - 7:00h: call [phone] Telephone: [phone]  5:19 PM 01/25/2020

## 2020-01-25 NOTE — Patient Instructions (Addendum)
ICD-10-CM   1. Interstitial lung disease ([place])  J84.9   2. History of environmental allergies  Z91.09   3. Shortness of breath  R06.02   4. Chronic cough  R05.3   5. Vaccine counseling  Z71.85     Shortness of breath Chronic cough Interstitial lung disease (HCC)   - -BAL lymphocytosis August 2019 following antifreeze exposure and ANA positivity History of environmental allergies   -Interstitial lung disease otherwise call pulmonary fibrosis is still there.  Based on his symptoms, walking desaturation test on pulmonary function test things are stable but on the CT scan of the chest may be there is a little progression  -Overall plan at this rate is to keep a close eye on his situation -if there is any progression at any time then we will discuss biopsy versus starting an antifibrotic  Plan -Repeat ANA, ACE ESR, SCL-70, SSA, SSB and double-stranded DNA, rheumatoid factor and CCP   -We can inform you of results -Repeat spirometry and DLCO in 3 to 4 months  -Try inhaler Pulmicort 2 puffs 2 times daily schedule with  combivent as needed -to see if your cough improves  Vaccine counseling  -Recommend Covid booster vaccine  Follow-up -3-4 months with Dr. Chase Caller 30-minute slot but after spirometry and DLCO  -ILD symptom score and simple walking desaturation test at the time of follow-up  -  Interpreter needed at follow-up

## 2020-01-26 ENCOUNTER — Other Ambulatory Visit (INDEPENDENT_AMBULATORY_CARE_PROVIDER_SITE_OTHER): Payer: Medicare Other

## 2020-01-26 DIAGNOSIS — J849 Interstitial pulmonary disease, unspecified: Secondary | ICD-10-CM | POA: Diagnosis not present

## 2020-01-26 LAB — SEDIMENTATION RATE: Sed Rate: 11 mm/hr (ref 0–30)

## 2020-02-02 LAB — CYCLIC CITRUL PEPTIDE ANTIBODY, IGG: Cyclic Citrullin Peptide Ab: <16 UNITS

## 2020-02-02 LAB — ANTI-NUCLEAR AB-TITER (ANA TITER)
ANA TITER: 1:320 {titer} — ABNORMAL HIGH
ANA Titer 1: 1:640 {titer} — ABNORMAL HIGH

## 2020-02-02 LAB — ANGIOTENSIN CONVERTING ENZYME: Angiotensin-Converting Enzyme: 16 U/L (ref 9–67)

## 2020-02-02 LAB — ANTI-DNA ANTIBODY, DOUBLE-STRANDED: ds DNA Ab: <1 IU/mL

## 2020-02-02 LAB — ANA: Anti Nuclear Antibody (ANA): POSITIVE — AB

## 2020-02-05 ENCOUNTER — Other Ambulatory Visit: Payer: Self-pay | Admitting: Family Medicine

## 2020-02-05 DIAGNOSIS — R7303 Prediabetes: Secondary | ICD-10-CM

## 2020-02-21 ENCOUNTER — Other Ambulatory Visit: Payer: Self-pay | Admitting: Family Medicine

## 2020-02-21 DIAGNOSIS — Z1231 Encounter for screening mammogram for malignant neoplasm of breast: Secondary | ICD-10-CM

## 2020-02-29 ENCOUNTER — Ambulatory Visit: Payer: Medicare Other | Admitting: Cardiovascular Disease

## 2020-03-02 ENCOUNTER — Ambulatory Visit (INDEPENDENT_AMBULATORY_CARE_PROVIDER_SITE_OTHER): Payer: Medicare Other | Admitting: Cardiology

## 2020-03-02 ENCOUNTER — Encounter: Payer: Self-pay | Admitting: Cardiology

## 2020-03-02 ENCOUNTER — Other Ambulatory Visit: Payer: Self-pay

## 2020-03-02 VITALS — BP 128/70 | HR 78 | Ht 65.0 in | Wt 147.8 lb

## 2020-03-02 DIAGNOSIS — I208 Other forms of angina pectoris: Secondary | ICD-10-CM

## 2020-03-02 DIAGNOSIS — I10 Essential (primary) hypertension: Secondary | ICD-10-CM | POA: Diagnosis not present

## 2020-03-02 DIAGNOSIS — I2510 Atherosclerotic heart disease of native coronary artery without angina pectoris: Secondary | ICD-10-CM | POA: Diagnosis not present

## 2020-03-02 DIAGNOSIS — I071 Rheumatic tricuspid insufficiency: Secondary | ICD-10-CM | POA: Diagnosis not present

## 2020-03-02 DIAGNOSIS — I480 Paroxysmal atrial fibrillation: Secondary | ICD-10-CM

## 2020-03-02 NOTE — Progress Notes (Signed)
Cardiology Office Note   Date:  03/02/2020   ID:  Tasha [name], Tasha [name] 06/15/1950, MRN [id number]  PCP:  Dettinger, Fransisca Kaufmann, MD  Cardiologist:  Was Dr. Bronson Ing     Chief Complaint  Patient presents with  . Atrial Fibrillation    once PAF, none since       History of Present Illness: Tasha [name] is a 69 y.o. female who presents for PAF and HTN   History of chest pain with coronary calcification on CT 02/2018,normal NST 02/2018,PAF one occurrence in the setting of pulmonary stress,monitor no Afib, continue ASA unless recurrence.Marland Kitchen also with thrombocytopenia, prediabetes, interstitial lung disease followed by pulmonary care.   Last saw Dr. Bronson Ing 05/2018 and BP up so losartan increased.   08/02/2019 Pt seen complaining of shortness of breath and palpitations while mopping and some chest pain. I added low-dose Toprol-XL 25 mg half a tablet daily and placed monitor on her.That showed heart rate from 53 to 137 bpm rare PACs very rare PVCs brief burst of SVT noted longest 7 beats no sustained arrhythmias or atrial fibrillation. Patient triggered events did not correlate with arrhythmia.  patient 08/30/19 and palpitations improved with toprol but rash and hives all over. I stopped toprol, referred to derm and started diltiazem 120 mg daily.   Last OV 10/11/19 Patient here accompanied by an interpreter. Never saw Dermatology. She switched her shampoo and soap and rash is gone. She also stopped the metoprolol. No palpitations on diltiazem. Feels much better. Starting to go to the gym. Heart started racing the one day but it stopped and now doing much better.   In August pt admitted  Her ACE was stopped for cough.  On losartan   Today with interpreter [name].  No chest pain and SOB with exertion, following with Pulmonary as well with ILD.   No racing HR or palpitations.  She has not been exercising.  We discussed her CAD of ca+ on CT but neg nuc study for ischemia. Family is  with her as well.    Past Medical History:  Diagnosis Date  . Acute respiratory failure ([place])   . CAP (community acquired pneumonia)   . Hyperlipidemia   . Hypertension   . Metabolic syndrome     Past Surgical History:  Procedure Laterality Date  . CESAREAN SECTION    . COLONOSCOPY N/A 06/07/2019   Procedure: COLONOSCOPY;  Surgeon: Danie Binder, MD;  [facility];  Service: Endoscopy;  Laterality: N/A;  12:45  . POLYPECTOMY  06/07/2019   Procedure: POLYPECTOMY;  Surgeon: Danie Binder, MD;  Location: [facility];  Service: Endoscopy;;     Current Outpatient Medications  Medication Sig Dispense Refill  . amLODipine (NORVASC) 5 MG tablet TAKE (1) TABLET EVERY NIGHT AT BEDTIME. 90 tablet 0  . aspirin EC 81 MG tablet Take 1 tablet (81 mg total) by mouth daily. 90 tablet 3  . atorvastatin (LIPITOR) 20 MG tablet Take 1 tablet (20 mg total) by mouth daily at 6 PM. 90 tablet 3  . budesonide (PULMICORT) 180 MCG/ACT inhaler Inhale 2 puffs into the lungs in the morning and at bedtime. 1 each 3  . Carboxymethylcellul-Glycerin (LUBRICATING EYE DROPS OP) Place 1 drop into both eyes daily as needed (dry eyes).    . cetirizine (ZYRTEC) 10 MG tablet Take 1 tablet (10 mg total) by mouth daily as needed for allergies. 90 tablet 3  . diltiazem (CARDIZEM CD) 120 MG 24 hr capsule Take 1  capsule (120 mg total) by mouth daily. 90 capsule 3  . hydrochlorothiazide (HYDRODIURIL) 25 MG tablet Take 1 tablet (25 mg total) by mouth daily. 90 tablet 3  . ibuprofen (ADVIL) 200 MG tablet Take 400 mg by mouth every 6 (six) hours as needed for headache or moderate pain.    . Ipratropium-Albuterol (COMBIVENT RESPIMAT) 20-100 MCG/ACT AERS respimat Inhale 1 puff into the lungs every 6 (six) hours as needed for wheezing. 4 g 5  . levothyroxine (SYNTHROID) 50 MCG tablet TAKE (1) TABLET DAILY BEFORE BREAKFAST. 90 tablet 0  . losartan (COZAAR) 25 MG tablet TAKE 1 TABLET BY MOUTH ONCE DAILY. 30 tablet 1  .  Omega 3 1000 MG CAPS Take 1,000 mg by mouth daily.     . pantoprazole (PROTONIX) 40 MG tablet Take 1 tablet (40 mg total) by mouth daily. 90 tablet 3   No current facility-administered medications for this visit.    Allergies:   Hydromet [hydrocodone-homatropine], Toprol xl [metoprolol], and Amoxicillin    Social History:  The patient  reports that she has quit smoking. She has never used smokeless tobacco. She reports that she does not drink alcohol and does not use drugs.   Family History:  The patient's family history includes Cancer in her father; Healthy in her brother, daughter, sister, son, and son; Memory loss in her sister.    ROS:  General:no colds or fevers, some weight increase Skin:no rashes or ulcers HEENT:no blurred vision, no congestion CV:see HPI PUL:see HPI GI:no diarrhea constipation or melena, no indigestion GU:no hematuria, no dysuria MS:no joint pain, no claudication Neuro:no syncope, no lightheadedness Endo:no diabetes, no thyroid disease  Wt Readings from Last 3 Encounters:  03/02/20 147 lb 12.8 oz (67 kg)  01/25/20 143 lb 9.6 oz (65.1 kg)  01/14/20 143 lb 3.2 oz (65 kg)     PHYSICAL EXAM: VS:  BP 128/70   Pulse 78   Ht 5\' 5"  (1.651 m)   Wt 147 lb 12.8 oz (67 kg)   SpO2 98%   BMI 24.60 kg/m  , BMI Body mass index is 24.6 kg/m. General:Pleasant affect, NAD Skin:Warm and dry, brisk capillary refill HEENT:normocephalic, sclera clear, mucus membranes moist Neck:supple, no JVD, no bruits  Heart:S1S2 RRR with soft murmur on exam, no gallup, rub or click Lungs:clear without rales, rhonchi, or wheezes YQM:VHQI, non tender, + BS, do not palpate liver spleen or masses Ext:no lower ext edema, 2+ pedal pulses, 2+ radial pulses Neuro:alert and oriented X 3 , MAE, follows commands, + facial symmetry    EKG:  EKG is ordered today. The ekg ordered today demonstrates SR with normal EKG   Recent Labs: 10/18/2019: ALT 43; BUN 14; Creatinine, Ser 0.87;  Potassium 3.3; Sodium 141; TSH 1.090 12/07/2019: Hemoglobin 13.1; Platelets 107.0    Lipid Panel    Component Value Date/Time   CHOL 117 10/18/2019 1109   CHOL 180 09/23/2012 1335   TRIG 177 (H) 10/18/2019 1109   TRIG 108 08/10/2014 0852   TRIG 135 09/23/2012 1335   HDL 36 (L) 10/18/2019 1109   HDL 40 08/10/2014 0852   HDL 37 (L) 09/23/2012 1335   CHOLHDL 3.3 10/18/2019 1109   LDLCALC 51 10/18/2019 1109   LDLCALC 118 (H) 05/04/2013 1528   LDLCALC 116 (H) 09/23/2012 1335   LDLDIRECT 77 07/11/2016 1536       Other studies Reviewed: Additional studies/ records that were reviewed today include: . Echo 12/18/17  Study Conclusions   - Left ventricle: The  cavity size was normal. Systolic function was  vigorous. The estimated ejection fraction was in the range of 65%  to 70%. Wall motion was normal; there were no regional wall  motion abnormalities. Doppler parameters are consistent with  abnormal left ventricular relaxation (grade 1 diastolic  dysfunction). There was no evidence of elevated ventricular  filling pressure by Doppler parameters.  - Aortic valve: Valve area (VTI): 1.43 cm^2. Valve area (Vmax):  1.67 cm^2. Valve area (Vmean): 1.63 cm^2.  - Aortic root: The aortic root was normal in size.  - Mitral valve: There was mild regurgitation.  - Left atrium: The atrium was normal in size.  - Right ventricle: Systolic function was normal.  - Tricuspid valve: There was severe regurgitation.  - Pulmonary arteries: Systolic pressure was moderately increased.  PA peak pressure: 54 mm Hg (S).  - Inferior vena cava: The vessel was normal in size.  - Pericardium, extracardiac: There was no pericardial effusion.   Monitor with some skipping and faster HR but no a fib and has resolved with dilt.   08/23/19  Nuc study 03/15/18 no ischemia   CT chest high resolution 12/30/19 There is aortic atherosclerosis, as well as atherosclerosis of the great vessels of the  mediastinum and the coronary arteries, including calcified atherosclerotic plaque in the left main, left anterior descending and right coronary arteries.  The appearance of the lungs is compatible with interstitial lung disease, with mild progression of changes compared to the prior study, with a spectrum of findings considered probable usual interstitial pneumonia (UIP) per current ATS guidelines.  ASSESSMENT AND PLAN:  1.  PAF with acutely ill hospitalization and none since.  palpitations resolved with dilt, after she had allergy with metoprolol.    2.  DOE with known coronary calcifications.  No chest pain  Comparing the 2 high resolution chest CTs by report may be increased, neg nuc in 2019.  Has been on statin.  Will ask Dr. Domenic Polite to evaluate. Have asked pt and family to call if increase in symptoms or chest pain.  Will see her back in 6 months unless we do nuc study or cardiac CTA. She is on ASA daily   3. HLD on statin per PCP  4.  Mild mitral and severe TR. Moderately elevated pulmonary pressures likely due to ILD.  Mild murmur on exam.   Current medicines are reviewed with the patient today.  The patient Has no concerns regarding medicines.  The following changes have been made:  See above Labs/ tests ordered today include:see above  Disposition:   FU:  see above  Signed, Cecilie Kicks, NP  03/02/2020 2:32 PM    [facility] [place], [place], [place], [place] Phone: [phone]; Fax: [phone]

## 2020-03-02 NOTE — Patient Instructions (Addendum)
Medication Instructions:  Continue all current medications.   Labwork: none  Testing/Procedures: none  Follow-Up: 6 months   Any Other Special Instructions Will Be Listed Below (If Applicable).   If you need a refill on your cardiac medications before your next appointment, please call your pharmacy.  

## 2020-03-03 ENCOUNTER — Telehealth: Payer: Self-pay | Admitting: *Deleted

## 2020-03-03 NOTE — Telephone Encounter (Signed)
Called pt through interpreter line. No answer. Voicemail not set up.

## 2020-03-03 NOTE — Telephone Encounter (Signed)
-----   Message from Isaiah Serge, NP sent at 03/02/2020  9:54 PM EST ----- Ms. Amadon speaks [place], please let her knwo that with her shortness of breath I reviewed recent CT of chest and had Dr. Domenic Polite review -  We think we should repeat her stress test, lexiscan myoview to make sure the coronary calcification has not increased.  If we could arrange then follow up for results.

## 2020-03-09 ENCOUNTER — Telehealth: Payer: Self-pay | Admitting: *Deleted

## 2020-03-09 DIAGNOSIS — Z23 Encounter for immunization: Secondary | ICD-10-CM | POA: Diagnosis not present

## 2020-03-09 NOTE — Telephone Encounter (Signed)
Call with interpreter to notify pt of test results. No answer, voicemail not set up.

## 2020-03-09 NOTE — Telephone Encounter (Signed)
-----   Message from Isaiah Serge, NP sent at 03/02/2020  9:54 PM EST ----- Tasha [name] speaks [place], please let her knwo that with her shortness of breath I reviewed recent CT of chest and had Dr. Domenic Polite review -  We think we should repeat her stress test, lexiscan myoview to make sure the coronary calcification has not increased.  If we could arrange then follow up for results.

## 2020-03-16 ENCOUNTER — Telehealth: Payer: Self-pay | Admitting: Internal Medicine

## 2020-03-16 NOTE — Telephone Encounter (Signed)
  ANA positive Has ILD  Plan  - refer rheumatology non urgent  Note: spanish speaking   Results for Tasha [name], Tasha [name] (MRN [id number]) as of 03/16/2020 18:52  Ref. Range 01/26/2020 11:13  Sed Rate Latest Ref Range: 0 - 30 mm/hr 11  Anti Nuclear Antibody (ANA) Latest Ref Range: NEGATIVE  POSITIVE (A)  ANA Pattern 1 Unknown Nuclear, Nucleolar (A)  ANA Titer 1 Latest Units: titer 1:640 (H)  Angiotensin-Converting Enzyme Latest Ref Range: 9 - 67 U/L 16  Cyclic Citrullin Peptide Ab Latest Units: UNITS <16  ds DNA Ab Latest Units: IU/mL <1

## 2020-03-20 ENCOUNTER — Ambulatory Visit: Payer: Medicare Other

## 2020-03-20 ENCOUNTER — Other Ambulatory Visit: Payer: Self-pay

## 2020-03-23 ENCOUNTER — Other Ambulatory Visit: Payer: Self-pay

## 2020-03-23 ENCOUNTER — Encounter: Payer: Self-pay | Admitting: Family Medicine

## 2020-03-23 ENCOUNTER — Ambulatory Visit (INDEPENDENT_AMBULATORY_CARE_PROVIDER_SITE_OTHER): Payer: Medicare Other | Admitting: Family Medicine

## 2020-03-23 ENCOUNTER — Ambulatory Visit (INDEPENDENT_AMBULATORY_CARE_PROVIDER_SITE_OTHER): Payer: Medicare Other

## 2020-03-23 VITALS — BP 122/72 | HR 84 | Temp 97.5°F | Ht 65.0 in | Wt 145.0 lb

## 2020-03-23 DIAGNOSIS — E785 Hyperlipidemia, unspecified: Secondary | ICD-10-CM

## 2020-03-23 DIAGNOSIS — I10 Essential (primary) hypertension: Secondary | ICD-10-CM

## 2020-03-23 DIAGNOSIS — Z780 Asymptomatic menopausal state: Secondary | ICD-10-CM

## 2020-03-23 DIAGNOSIS — I208 Other forms of angina pectoris: Secondary | ICD-10-CM | POA: Diagnosis not present

## 2020-03-23 DIAGNOSIS — K219 Gastro-esophageal reflux disease without esophagitis: Secondary | ICD-10-CM | POA: Diagnosis not present

## 2020-03-23 DIAGNOSIS — E039 Hypothyroidism, unspecified: Secondary | ICD-10-CM | POA: Diagnosis not present

## 2020-03-23 DIAGNOSIS — R7303 Prediabetes: Secondary | ICD-10-CM | POA: Diagnosis not present

## 2020-03-23 LAB — BAYER DCA HB A1C WAIVED: HB A1C (BAYER DCA - WAIVED): 5.3 % (ref <7.0)

## 2020-03-23 MED ORDER — AMLODIPINE BESYLATE 5 MG PO TABS: 5.0000 mg | ORAL_TABLET | Freq: Every day | ORAL | 3 refills | Status: DC

## 2020-03-23 MED ORDER — PANTOPRAZOLE SODIUM 40 MG PO TBEC: 40.0000 mg | DELAYED_RELEASE_TABLET | Freq: Every day | ORAL | 3 refills | Status: DC

## 2020-03-23 MED ORDER — LOSARTAN POTASSIUM 25 MG PO TABS: 25.0000 mg | ORAL_TABLET | Freq: Every day | ORAL | 3 refills | Status: DC

## 2020-03-23 MED ORDER — ATORVASTATIN CALCIUM 20 MG PO TABS: 20.0000 mg | ORAL_TABLET | Freq: Every day | ORAL | 3 refills | Status: DC

## 2020-03-23 MED ORDER — LEVOTHYROXINE SODIUM 50 MCG PO TABS: 50.0000 ug | ORAL_TABLET | Freq: Every day | ORAL | 3 refills | Status: DC

## 2020-03-23 NOTE — Progress Notes (Signed)
BP 122/72   Pulse 84   Temp (!) 97.5 F (36.4 C)   Ht 5' 5"  (1.651 m)   Wt 145 lb (65.8 kg)   SpO2 99%   BMI 24.13 kg/m    Subjective:   [name], female    DOB: May 17, 1950, 69 y.o.   MRN: [id number]  HPI: Tasha [name] is a 69 y.o. female presenting on 03/23/2020 for Hypothyroidism, Hypertension, and Prediabetes   HPI Hypothyroidism recheck Patient is coming in for thyroid recheck today as well. They deny any issues with hair changes or heat or cold problems or diarrhea or constipation. They deny any chest pain or palpitations. They are currently on levothyroxine 35mcrograms   Hypertension and chronic A. fib, sees cardiology Patient is currently on amlodipine and diltiazem and hydrochlorothiazide and losartan, and their blood pressure today is 122/72. Patient denies any lightheadedness or dizziness. Patient denies headaches, blurred vision, chest pains, shortness of breath, or weakness. Denies any side effects from medication and is content with current medication.   Prediabetes Patient comes in today for recheck of his diabetes. Patient has been currently taking no medication currently, A1c is 5.3 today.. Patient is currently on an ACE inhibitor/ARB. Patient has seen an ophthalmologist this year. Patient denies any issues with their feet. The symptom started onset as an adult hypertension and A. fib and hypothyroidism ARE RELATED TO DM   Relevant past medical, surgical, family and social history reviewed and updated as indicated. Interim medical history since our last visit reviewed. Allergies and medications reviewed and updated.  Review of Systems  Constitutional: Negative for chills and fever.  Eyes: Negative for visual disturbance.  Respiratory: Negative for chest tightness and shortness of breath.   Cardiovascular: Negative for chest pain and leg swelling.  Musculoskeletal: Negative for back pain and gait problem.  Skin: Negative for rash.    Neurological: Negative for light-headedness and headaches.  Psychiatric/Behavioral: Negative for agitation and behavioral problems.  All other systems reviewed and are negative.   Per HPI unless specifically indicated above   Allergies as of 03/23/2020      Reactions   Hydromet [hydrocodone-homatropine]    Pt doesn't remember   Toprol Xl [metoprolol] Hives   Facial rash, itching, hives   Amoxicillin Rash   Did it involve swelling of the face/tongue/throat, SOB, or low BP? No Did it involve sudden or severe rash/hives, skin peeling, or any reaction on the inside of your mouth or nose? Unknown Did you need to seek medical attention at a hospital or doctor's office? Unknown When did it last happen?10 + years If all above answers are "NO", may proceed with cephalosporin use.      Medication List       Accurate as of March 23, 2020 10:13 AM. If you have any questions, ask your nurse or doctor.        amLODipine 5 MG tablet Commonly known as: NORVASC TAKE (1) TABLET EVERY NIGHT AT BEDTIME.   aspirin EC 81 MG tablet Take 1 tablet (81 mg total) by mouth daily.   atorvastatin 20 MG tablet Commonly known as: LIPITOR Take 1 tablet (20 mg total) by mouth daily at 6 PM.   budesonide 180 MCG/ACT inhaler Commonly known as: PULMICORT Inhale 2 puffs into the lungs in the morning and at bedtime.   cetirizine 10 MG tablet Commonly known as: ZYRTEC Take 1 tablet (10 mg total) by mouth daily as needed for allergies.   Combivent Respimat 20-100 MCG/ACT  Aers respimat Generic drug: Ipratropium-Albuterol Inhale 1 puff into the lungs every 6 (six) hours as needed for wheezing.   diltiazem 120 MG 24 hr capsule Commonly known as: CARDIZEM CD Take 1 capsule (120 mg total) by mouth daily.   hydrochlorothiazide 25 MG tablet Commonly known as: HYDRODIURIL Take 1 tablet (25 mg total) by mouth daily.   ibuprofen 200 MG tablet Commonly known as: ADVIL Take 400 mg by mouth every 6  (six) hours as needed for headache or moderate pain.   levothyroxine 50 MCG tablet Commonly known as: SYNTHROID TAKE (1) TABLET DAILY BEFORE BREAKFAST.   losartan 25 MG tablet Commonly known as: COZAAR TAKE 1 TABLET BY MOUTH ONCE DAILY.   LUBRICATING EYE DROPS OP Place 1 drop into both eyes daily as needed (dry eyes).   Omega 3 1000 MG Caps Take 1,000 mg by mouth daily.   pantoprazole 40 MG tablet Commonly known as: PROTONIX Take 1 tablet (40 mg total) by mouth daily.        Objective:   BP 122/72   Pulse 84   Temp (!) 97.5 F (36.4 C)   Ht 5' 5"  (1.651 m)   Wt 145 lb (65.8 kg)   SpO2 99%   BMI 24.13 kg/m   Wt Readings from Last 3 Encounters:  03/23/20 145 lb (65.8 kg)  03/02/20 147 lb 12.8 oz (67 kg)  01/25/20 143 lb 9.6 oz (65.1 kg)    Physical Exam Vitals and nursing note reviewed.  Constitutional:      General: She is not in acute distress.    Appearance: She is well-developed. She is not diaphoretic.  Eyes:     Conjunctiva/sclera: Conjunctivae normal.  Cardiovascular:     Rate and Rhythm: Normal rate and regular rhythm.     Heart sounds: Normal heart sounds. No murmur heard.   Pulmonary:     Effort: Pulmonary effort is normal. No respiratory distress.     Breath sounds: Normal breath sounds. No wheezing.  Musculoskeletal:        General: No tenderness. Normal range of motion.  Skin:    General: Skin is warm and dry.     Findings: No rash.  Neurological:     Mental Status: She is alert and oriented to person, place, and time.     Coordination: Coordination normal.  Psychiatric:        Behavior: Behavior normal.       Assessment & Plan:   Problem List Items Addressed This Visit      Cardiovascular and Mediastinum   Hypertension   Relevant Medications   amLODipine (NORVASC) 5 MG tablet   atorvastatin (LIPITOR) 20 MG tablet   losartan (COZAAR) 25 MG tablet   Other Relevant Orders   CMP14+EGFR   Lipid panel     Digestive    Gastroesophageal reflux disease without esophagitis   Relevant Medications   pantoprazole (PROTONIX) 40 MG tablet   Other Relevant Orders   CBC with Differential/Platelet     Endocrine   Hypothyroidism   Relevant Medications   levothyroxine (SYNTHROID) 50 MCG tablet   Other Relevant Orders   TSH     Other   Hyperlipidemia with target LDL less than 100   Relevant Medications   amLODipine (NORVASC) 5 MG tablet   atorvastatin (LIPITOR) 20 MG tablet   losartan (COZAAR) 25 MG tablet   Other Relevant Orders   Lipid panel   Prediabetes - Primary   Relevant Medications   losartan (COZAAR)  25 MG tablet   Other Relevant Orders   Bayer DCA Hb A1c Waived   CMP14+EGFR    Other Visit Diagnoses    Postmenopausal       Relevant Orders   DG [facility] DEXA   Essential hypertension       Relevant Medications   amLODipine (NORVASC) 5 MG tablet   atorvastatin (LIPITOR) 20 MG tablet   losartan (COZAAR) 25 MG tablet   Other Relevant Orders   CMP14+EGFR      Continue current medication.  A1c looks good at 5.3.  Will check other blood work. Follow up plan: Return in about 6 months (around 09/21/2020), or if symptoms worsen or fail to improve, for Prediabetes and thyroid and hypertension.  Counseling provided for all of the vaccine components Orders Placed This Encounter  Procedures  . [facility] Hb A1c [place], MD [facility] 03/23/2020, 10:13 AM

## 2020-03-23 NOTE — Telephone Encounter (Signed)
Attempted to call pt's son Roselie Awkward to discuss pt's labwork and recommendations per MR but unable to reach.left message for him to return call.

## 2020-03-24 DIAGNOSIS — Z780 Asymptomatic menopausal state: Secondary | ICD-10-CM | POA: Diagnosis not present

## 2020-03-24 DIAGNOSIS — M810 Age-related osteoporosis without current pathological fracture: Secondary | ICD-10-CM | POA: Diagnosis not present

## 2020-03-24 LAB — CBC WITH DIFFERENTIAL/PLATELET
Basophils Absolute: 0 10*3/uL (ref 0.0–0.2)
Basos: 1 %
EOS (ABSOLUTE): 0.2 10*3/uL (ref 0.0–0.4)
Eos: 3 %
Hematocrit: 40.6 % (ref 34.0–46.6)
Hemoglobin: 13.4 g/dL (ref 11.1–15.9)
Immature Grans (Abs): 0 10*3/uL (ref 0.0–0.1)
Immature Granulocytes: 0 %
Lymphocytes Absolute: 1.7 10*3/uL (ref 0.7–3.1)
Lymphs: 23 %
MCH: 30.1 pg (ref 26.6–33.0)
MCHC: 33 g/dL (ref 31.5–35.7)
MCV: 91 fL (ref 79–97)
Monocytes Absolute: 0.8 10*3/uL (ref 0.1–0.9)
Monocytes: 11 %
Neutrophils Absolute: 4.6 10*3/uL (ref 1.4–7.0)
Neutrophils: 62 %
Platelets: 141 10*3/uL — ABNORMAL LOW (ref 150–450)
RBC: 4.45 x10E6/uL (ref 3.77–5.28)
RDW: 13.6 % (ref 11.7–15.4)
WBC: 7.4 10*3/uL (ref 3.4–10.8)

## 2020-03-24 LAB — TSH: TSH: 1.13 u[IU]/mL (ref 0.450–4.500)

## 2020-03-24 LAB — CMP14+EGFR
ALT: 44 IU/L — ABNORMAL HIGH (ref 0–32)
AST: 36 IU/L (ref 0–40)
Albumin/Globulin Ratio: 1.9 (ref 1.2–2.2)
Albumin: 4.6 g/dL (ref 3.8–4.8)
Alkaline Phosphatase: 76 IU/L (ref 44–121)
BUN/Creatinine Ratio: 20 (ref 12–28)
BUN: 15 mg/dL (ref 8–27)
Bilirubin Total: 0.3 mg/dL (ref 0.0–1.2)
CO2: 24 mmol/L (ref 20–29)
Calcium: 9.5 mg/dL (ref 8.7–10.3)
Chloride: 102 mmol/L (ref 96–106)
Creatinine, Ser: 0.76 mg/dL (ref 0.57–1.00)
GFR calc Af Amer: 93 mL/min/{1.73_m2} (ref >59)
GFR calc non Af Amer: 80 mL/min/{1.73_m2} (ref >59)
Globulin, Total: 2.4 g/dL (ref 1.5–4.5)
Glucose: 94 mg/dL (ref 65–99)
Potassium: 3.2 mmol/L — ABNORMAL LOW (ref 3.5–5.2)
Sodium: 142 mmol/L (ref 134–144)
Total Protein: 7 g/dL (ref 6.0–8.5)

## 2020-03-24 LAB — LIPID PANEL
Chol/HDL Ratio: 3 ratio (ref 0.0–4.4)
Cholesterol, Total: 135 mg/dL (ref 100–199)
HDL: 45 mg/dL (ref >39)
LDL Chol Calc (NIH): 71 mg/dL (ref 0–99)
Triglycerides: 105 mg/dL (ref 0–149)
VLDL Cholesterol Cal: 19 mg/dL (ref 5–40)

## 2020-03-24 NOTE — Telephone Encounter (Signed)
Attempted to call pt's son Roselie Awkward but unable to reach. Left message for him to return call. Due to multiple attempts trying to reach and unable to do so, letter will be sent to pt and encounter will be closed.

## 2020-03-31 ENCOUNTER — Encounter: Payer: Self-pay | Admitting: *Deleted

## 2020-04-17 ENCOUNTER — Telehealth: Payer: Self-pay | Admitting: Internal Medicine

## 2020-04-17 DIAGNOSIS — J849 Interstitial pulmonary disease, unspecified: Secondary | ICD-10-CM

## 2020-04-17 DIAGNOSIS — R768 Other specified abnormal immunological findings in serum: Secondary | ICD-10-CM

## 2020-04-17 NOTE — Telephone Encounter (Signed)
Received a message from [place] stating she had called the patient's daughter Maxine Glenn to get her scheduled for a CT scan and she wanted to speak with someone in regards to the lab results.   I called and spoke with [facility] I advised her that MR had stated that her mother's ANA results became back positive and he would like for her to be seen by a rheumatologist. She verbalized understanding and is ok with this referral. Referral has been placed.   Nothing further needed at time of call.

## 2020-04-19 ENCOUNTER — Other Ambulatory Visit: Payer: Self-pay

## 2020-04-19 ENCOUNTER — Ambulatory Visit
Admission: RE | Admit: 2020-04-19 | Discharge: 2020-04-19 | Disposition: A | Discharge status: Home / Self Care | Payer: Medicare Other | Source: Ambulatory Visit | Attending: Family Medicine | Admitting: Family Medicine

## 2020-04-19 DIAGNOSIS — Z1231 Encounter for screening mammogram for malignant neoplasm of breast: Secondary | ICD-10-CM | POA: Diagnosis not present

## 2020-05-05 ENCOUNTER — Telehealth: Payer: Self-pay

## 2020-05-05 NOTE — Telephone Encounter (Signed)
ATC pt to  Notify that CT appointment is canceled r/t MR stating we did not need CT at this time. Voicemail was not set on phone. Pt also needs PFT scheduled and f/u with MR. Will attempt to contact at latter time.

## 2020-05-10 ENCOUNTER — Other Ambulatory Visit (HOSPITAL_COMMUNITY): Payer: Medicare Other

## 2020-07-31 ENCOUNTER — Encounter: Payer: Self-pay | Admitting: Nurse Practitioner

## 2020-07-31 ENCOUNTER — Ambulatory Visit (INDEPENDENT_AMBULATORY_CARE_PROVIDER_SITE_OTHER): Payer: Medicare Other | Admitting: Nurse Practitioner

## 2020-07-31 VITALS — BP 104/70 | HR 94 | Temp 98.7°F

## 2020-07-31 DIAGNOSIS — J029 Acute pharyngitis, unspecified: Secondary | ICD-10-CM | POA: Insufficient documentation

## 2020-07-31 DIAGNOSIS — J028 Acute pharyngitis due to other specified organisms: Secondary | ICD-10-CM

## 2020-07-31 MED ORDER — ACETAMINOPHEN 500 MG PO TABS: 500.0000 mg | ORAL_TABLET | Freq: Four times a day (QID) | ORAL | 0 refills | Status: AC | PRN

## 2020-07-31 MED ORDER — DM-GUAIFENESIN ER 30-600 MG PO TB12: 1.0000 | ORAL_TABLET | Freq: Two times a day (BID) | ORAL | 0 refills | Status: DC

## 2020-07-31 MED ORDER — AZITHROMYCIN 250 MG PO TABS: ORAL_TABLET | ORAL | 0 refills | Status: DC

## 2020-07-31 NOTE — Assessment & Plan Note (Signed)
Symptoms not well controlled in the last 7 days.  Patient is reporting nasal congestion, sore throat and generalized body ache.  She has had low-grade fever and chills but is afebrile today.  Completed COVID-19 swab which results pending.  Guaifenesin for cough and congestion.  Tylenol for headache and fever. Education provided to patient with printed handouts given.  Follow-up with worsening unresolved symptoms.

## 2020-07-31 NOTE — Patient Instructions (Signed)

## 2020-07-31 NOTE — Progress Notes (Signed)
Acute Office Visit  Subjective:    [name], female    DOB: Mar 09, 1951, 70 y.o.   MRN: [id number]  Chief Complaint  Patient presents with  . Cough    Cough This is a new problem. The current episode started in the past 7 days. The problem has been unchanged. The problem occurs constantly. The cough is productive of sputum. Associated symptoms include chills, ear pain, a fever, nasal congestion and a sore throat. Nothing aggravates the symptoms. She has tried nothing for the symptoms.    Past Medical History:  Diagnosis Date  . Acute respiratory failure ([place])   . CAP (community acquired pneumonia)   . Hyperlipidemia   . Hypertension   . Metabolic syndrome     Past Surgical History:  Procedure Laterality Date  . CESAREAN SECTION    . COLONOSCOPY N/A 06/07/2019   Procedure: COLONOSCOPY;  Surgeon: Danie Binder, MD;  [facility];  Service: Endoscopy;  Laterality: N/A;  12:45  . POLYPECTOMY  06/07/2019   Procedure: POLYPECTOMY;  Surgeon: Danie Binder, MD;  [facility];  Service: Endoscopy;;    Family History  Problem Relation Age of Onset  . Cancer Father   . Healthy Brother   . Healthy Daughter   . Healthy Sister   . Memory loss Sister   . Healthy Son   . Healthy Son   . Breast cancer Niece     Social History   Socioeconomic History  . Marital status: Married    Spouse name: Ramiro  . Number of children: 3  . Years of education: 6  . Highest education level: 6th grade  Occupational History  . Occupation: retired  Tobacco Use  . Smoking status: Former Research scientist (life sciences)  . Smokeless tobacco: Never Used  Vaping Use  . Vaping Use: Never used  Substance and Sexual Activity  . Alcohol use: No  . Drug use: No  . Sexual activity: Not Currently    Birth control/protection: Post-menopausal  Other Topics Concern  . Not on file  Social History Narrative  . Not on file   Social Determinants of Health   Financial Resource  Strain: Not on file  Food Insecurity: Not on file  Transportation Needs: Not on file  Physical Activity: Not on file  Stress: Not on file  Social Connections: Not on file  Intimate Partner Violence: Not on file    Outpatient Medications Prior to Visit  Medication Sig Dispense Refill  . amLODipine (NORVASC) 5 MG tablet Take 1 tablet (5 mg total) by mouth daily. 90 tablet 3  . aspirin EC 81 MG tablet Take 1 tablet (81 mg total) by mouth daily. 90 tablet 3  . atorvastatin (LIPITOR) 20 MG tablet Take 1 tablet (20 mg total) by mouth daily at 6 PM. 90 tablet 3  . budesonide (PULMICORT) 180 MCG/ACT inhaler Inhale 2 puffs into the lungs in the morning and at bedtime. 1 each 3  . Carboxymethylcellul-Glycerin (LUBRICATING EYE DROPS OP) Place 1 drop into both eyes daily as needed (dry eyes).    . cetirizine (ZYRTEC) 10 MG tablet Take 1 tablet (10 mg total) by mouth daily as needed for allergies. 90 tablet 3  . diltiazem (CARDIZEM CD) 120 MG 24 hr capsule Take 1 capsule (120 mg total) by mouth daily. 90 capsule 3  . hydrochlorothiazide (HYDRODIURIL) 25 MG tablet Take 1 tablet (25 mg total) by mouth daily. 90 tablet 3  . ibuprofen (ADVIL) 200 MG  tablet Take 400 mg by mouth every 6 (six) hours as needed for headache or moderate pain.    . Ipratropium-Albuterol (COMBIVENT RESPIMAT) 20-100 MCG/ACT AERS respimat Inhale 1 puff into the lungs every 6 (six) hours as needed for wheezing. 4 g 5  . levothyroxine (SYNTHROID) 50 MCG tablet Take 1 tablet (50 mcg total) by mouth daily before breakfast. 90 tablet 3  . losartan (COZAAR) 25 MG tablet Take 1 tablet (25 mg total) by mouth daily. 90 tablet 3  . Omega 3 1000 MG CAPS Take 1,000 mg by mouth daily.     . pantoprazole (PROTONIX) 40 MG tablet Take 1 tablet (40 mg total) by mouth daily. 90 tablet 3   No facility-administered medications prior to visit.    Allergies  Allergen Reactions  . Hydromet [Hydrocodone-Homatropine]     Pt doesn't remember  . Toprol  Xl [Metoprolol] Hives    Facial rash, itching, hives  . Amoxicillin Rash    Did it involve swelling of the face/tongue/throat, SOB, or low BP? No Did it involve sudden or severe rash/hives, skin peeling, or any reaction on the inside of your mouth or nose? Unknown Did you need to seek medical attention at a hospital or doctor's office? Unknown When did it last happen?10 + years If all above answers are "NO", may proceed with cephalosporin use.     Review of Systems  Constitutional: Positive for chills and fever.  HENT: Positive for ear pain and sore throat.   Respiratory: Positive for cough.   Gastrointestinal: Negative for diarrhea and nausea.  Genitourinary: Negative.   Musculoskeletal: Negative.   Neurological: Negative.        Objective:    Physical Exam Vitals reviewed.  Constitutional:      Appearance: Normal appearance.  HENT:     Head: Normocephalic.     Nose: Congestion present.  Eyes:     Conjunctiva/sclera: Conjunctivae normal.  Cardiovascular:     Rate and Rhythm: Normal rate and regular rhythm.     Pulses: Normal pulses.     Heart sounds: Normal heart sounds.  Pulmonary:     Effort: Pulmonary effort is normal.     Breath sounds: Normal breath sounds.  Abdominal:     General: Bowel sounds are normal.  Musculoskeletal:        General: Normal range of motion.  Skin:    General: Skin is warm.  Neurological:     Mental Status: She is alert and oriented to person, place, and time.  Psychiatric:        Behavior: Behavior normal.     BP 104/70   Pulse 94   Temp 98.7 F (37.1 C) (Temporal)   SpO2 92%  Wt Readings from Last 3 Encounters:  03/23/20 145 lb (65.8 kg)  03/02/20 147 lb 12.8 oz (67 kg)  01/25/20 143 lb 9.6 oz (65.1 kg)    Health Maintenance Due  Topic Date Due  . TETANUS/TDAP  04/23/2019    There are no preventive care reminders to display for this patient.   Lab Results  Component Value Date   TSH 1.130 03/23/2020   Lab  Results  Component Value Date   WBC 7.4 03/23/2020   HGB 13.4 03/23/2020   HCT 40.6 03/23/2020   MCV 91 03/23/2020   PLT 141 (L) 03/23/2020   Lab Results  Component Value Date   NA 142 03/23/2020   K 3.2 (L) 03/23/2020   CO2 24 03/23/2020   GLUCOSE 94 03/23/2020  BUN 15 03/23/2020   CREATININE 0.76 03/23/2020   BILITOT 0.3 03/23/2020   ALKPHOS 76 03/23/2020   AST 36 03/23/2020   ALT 44 (H) 03/23/2020   PROT 7.0 03/23/2020   ALBUMIN 4.6 03/23/2020   CALCIUM 9.5 03/23/2020   ANIONGAP 9 01/09/2018   Lab Results  Component Value Date   CHOL 135 03/23/2020   Lab Results  Component Value Date   HDL 45 03/23/2020   Lab Results  Component Value Date   LDLCALC 71 03/23/2020   Lab Results  Component Value Date   TRIG 105 03/23/2020   Lab Results  Component Value Date   CHOLHDL 3.0 03/23/2020   Lab Results  Component Value Date   HGBA1C 5.3 03/23/2020       Assessment & Plan:   Problem List Items Addressed This Visit      Respiratory   Pharyngitis - Primary    Symptoms not well controlled in the last 7 days.  Patient is reporting nasal congestion, sore throat and generalized body ache.  She has had low-grade fever and chills but is afebrile today.  Completed COVID-19 swab which results pending.  Guaifenesin for cough and congestion.  Tylenol for headache and fever. Education provided to patient with printed handouts given.  Follow-up with worsening unresolved symptoms.       Relevant Medications   azithromycin (ZITHROMAX) 250 MG tablet   Other Relevant Orders   Novel Coronavirus, NAA ([facility])       Meds ordered this encounter  Medications  . azithromycin (ZITHROMAX) 250 MG tablet    Sig: 2 tablet (500mg ) day, 1, 1 tablet (250 mg) day 2-5    Dispense:  6 tablet    Refill:  0    Order Specific Question:   Supervising Provider    Answer:   Janora Norlander [[id number]]  . acetaminophen (TYLENOL) 500 MG tablet    Sig: Take 1 tablet (500 mg total) by  mouth every 6 (six) hours as needed.    Dispense:  30 tablet    Refill:  0    Order Specific Question:   Supervising Provider    Answer:   Janora Norlander [[id number]]  . dextromethorphan-guaiFENesin (MUCINEX DM) 30-600 MG 12hr tablet    Sig: Take 1 tablet by mouth 2 (two) times daily.    Dispense:  30 tablet    Refill:  0    Order Specific Question:   Supervising Provider    Answer:   Janora Norlander [[id number]]     Ivy Lynn, NP

## 2020-08-01 LAB — NOVEL CORONAVIRUS, NAA: SARS-CoV-2, NAA: NOT DETECTED

## 2020-08-01 LAB — SARS-COV-2, NAA 2 DAY TAT

## 2020-08-30 NOTE — Progress Notes (Signed)
Cardiology Office Note    Date:  09/06/2020   ID:  Tasha [name], Tasha [name] 12-16-1950, MRN [id number]   PCP:  Dettinger, Fransisca Kaufmann, MD   [place]  Cardiologist:  Kate Sable, MD (Inactive)  Advanced Practice Provider:  No care team member to display Electrophysiologist:  None   [phone]   Chief Complaint  Patient presents with  . Follow-up    History of Present Illness:  Tasha [name] is a 70 y.o. female with history of hypertension, PAF 1 occurrence in the setting of pulmonary stress no repeat on monitor continued on aspirin, coronary calcifications on CT in 2019 normal NST 02/2018, thrombocytopenia, ILD  Patient with short of breath and palpitations 07/2019 low-dose Toprol-XL 25 mg half tablet added.  Monitor showed heart rate 53-137 with PACs rare PVCs and brief burst of SVT. Developed rash and changed to diltiazem.  Patient comes in with grandson and interpreter Tasha [name]. Complains of allergies. Complains of getting tired and a little palpitations when going up stairs or hill, goes away with rest. When she gets short of breath she uses her inhaler. Symptoms unchanged since she was here last. She is on both amlodipine and diltiazem. BP runs 120-140/70's depending on what she eats. Says sometimes she wakes up with nightmares and has to take a deep breath. No palpitations.  Past Medical History:  Diagnosis Date  . Acute respiratory failure ([place])   . CAP (community acquired pneumonia)   . Hyperlipidemia   . Hypertension   . Metabolic syndrome     Past Surgical History:  Procedure Laterality Date  . CESAREAN SECTION    . COLONOSCOPY N/A 06/07/2019   Procedure: COLONOSCOPY;  Surgeon: Danie Binder, MD;  [facility];  Service: Endoscopy;  Laterality: N/A;  12:45  . POLYPECTOMY  06/07/2019   Procedure: POLYPECTOMY;  Surgeon: Danie Binder, MD;  [facility];  Service: Endoscopy;;    Current Medications: Current Meds   Medication Sig  . acetaminophen (TYLENOL) 500 MG tablet Take 1 tablet (500 mg total) by mouth every 6 (six) hours as needed.  Marland Kitchen aspirin EC 81 MG tablet Take 1 tablet (81 mg total) by mouth daily.  Marland Kitchen atorvastatin (LIPITOR) 20 MG tablet Take 1 tablet (20 mg total) by mouth daily at 6 PM.  . azithromycin (ZITHROMAX) 250 MG tablet 2 tablet (500mg ) day, 1, 1 tablet (250 mg) day 2-5  . budesonide (PULMICORT) 180 MCG/ACT inhaler Inhale 2 puffs into the lungs in the morning and at bedtime.  . Carboxymethylcellul-Glycerin (LUBRICATING EYE DROPS OP) Place 1 drop into both eyes daily as needed (dry eyes).  . cetirizine (ZYRTEC) 10 MG tablet Take 1 tablet (10 mg total) by mouth daily as needed for allergies.  Marland Kitchen dextromethorphan-guaiFENesin (MUCINEX DM) 30-600 MG 12hr tablet Take 1 tablet by mouth 2 (two) times daily.  Marland Kitchen diltiazem (CARDIZEM CD) 120 MG 24 hr capsule Take 1 capsule (120 mg total) by mouth daily.  . hydrochlorothiazide (HYDRODIURIL) 25 MG tablet Take 1 tablet (25 mg total) by mouth daily.  Marland Kitchen ibuprofen (ADVIL) 200 MG tablet Take 400 mg by mouth every 6 (six) hours as needed for headache or moderate pain.  . Ipratropium-Albuterol (COMBIVENT RESPIMAT) 20-100 MCG/ACT AERS respimat Inhale 1 puff into the lungs every 6 (six) hours as needed for wheezing.  Marland Kitchen levothyroxine (SYNTHROID) 50 MCG tablet Take 1 tablet (50 mcg total) by mouth daily before breakfast.  . Omega 3 1000 MG CAPS Take 1,000  mg by mouth daily.   . pantoprazole (PROTONIX) 40 MG tablet Take 1 tablet (40 mg total) by mouth daily.  . [DISCONTINUED] amLODipine (NORVASC) 5 MG tablet Take 1 tablet (5 mg total) by mouth daily.  . [DISCONTINUED] losartan (COZAAR) 25 MG tablet Take 1 tablet (25 mg total) by mouth daily.     Allergies:   Hydromet [hydrocodone bit-homatrop mbr], Toprol xl [metoprolol], and Amoxicillin   Social History   Socioeconomic History  . Marital status: Married    Spouse name: Tasha [name]  . Number of children: 3  .  Years of education: 6  . Highest education level: 6th grade  Occupational History  . Occupation: retired  Tobacco Use  . Smoking status: Former Research scientist (life sciences)  . Smokeless tobacco: Never Used  Vaping Use  . Vaping Use: Never used  Substance and Sexual Activity  . Alcohol use: No  . Drug use: No  . Sexual activity: Not Currently    Birth control/protection: Post-menopausal  Other Topics Concern  . Not on file  Social History Narrative  . Not on file   Social Determinants of Health   Financial Resource Strain: Not on file  Food Insecurity: Not on file  Transportation Needs: Not on file  Physical Activity: Not on file  Stress: Not on file  Social Connections: Not on file     Family History:  The patient's family history includes Breast cancer in her niece; Cancer in her father; Healthy in her brother, daughter, sister, son, and son; Memory loss in her sister.   ROS:   Please see the history of present illness.    ROS All other systems reviewed and are negative.   PHYSICAL EXAM:   VS:  BP 124/82   Pulse 82   Ht 5\' 5"  (1.651 m)   Wt 147 lb 3.2 oz (66.8 kg)   SpO2 98%   BMI 24.50 kg/m   Physical Exam  GEN: Well nourished, well developed, in no acute distress  Neck: no JVD, carotid bruits, or masses Cardiac:RRR; no murmurs, rubs, or gallops  Respiratory:  clear to auscultation bilaterally, normal work of breathing GI: soft, nontender, nondistended, + BS Ext: without cyanosis, clubbing, or edema, Good distal pulses bilaterally Neuro:  Alert and Oriented x 3 Psych: euthymic mood, full affect  Wt Readings from Last 3 Encounters:  09/06/20 147 lb 3.2 oz (66.8 kg)  03/23/20 145 lb (65.8 kg)  03/02/20 147 lb 12.8 oz (67 kg)      Studies/Labs Reviewed:   EKG:  EKG is not ordered today.   Recent Labs: 03/23/2020: ALT 44; BUN 15; Creatinine, Ser 0.76; Hemoglobin 13.4; Platelets 141; Potassium 3.2; Sodium 142; TSH 1.130   Lipid Panel    Component Value Date/Time   CHOL 135  03/23/2020 1042   CHOL 180 09/23/2012 1335   TRIG 105 03/23/2020 1042   TRIG 108 08/10/2014 0852   TRIG 135 09/23/2012 1335   HDL 45 03/23/2020 1042   HDL 40 08/10/2014 0852   HDL 37 (L) 09/23/2012 1335   CHOLHDL 3.0 03/23/2020 1042   LDLCALC 71 03/23/2020 1042   LDLCALC 118 (H) 05/04/2013 1528   LDLCALC 116 (H) 09/23/2012 1335   LDLDIRECT 77 07/11/2016 1536    Additional studies/ records that were reviewed today include:  Monitor 08/23/19  . [employer] monitor resulted, 8 days 8 hours 21 minutes analyzed. Predominant rhythm is sinus with heart rate ranging from 53 bpm up to 137 bpm and average heart rate 78 bpm. Rare  PACs were noted representing less than 1% of total beats. Very rare PVCs noted. Brief bursts of SVT noted, the longest of which was 7 beats. There were no sustained arrhythmias or pauses. No definite atrial fibrillation. Patient triggered events did not clearly correlate with arrhythmia.    Echo 12/18/17  Study Conclusions   - Left ventricle: The cavity size was normal. Systolic function was    vigorous. The estimated ejection fraction was in the range of 65%    to 70%. Wall motion was normal; there were no regional wall    motion abnormalities. Doppler parameters are consistent with    abnormal left ventricular relaxation (grade 1 diastolic    dysfunction). There was no evidence of elevated ventricular    filling pressure by Doppler parameters.  - Aortic valve: Valve area (VTI): 1.43 cm^2. Valve area (Vmax):    1.67 cm^2. Valve area (Vmean): 1.63 cm^2.  - Aortic root: The aortic root was normal in size.  - Mitral valve: There was mild regurgitation.  - Left atrium: The atrium was normal in size.  - Right ventricle: Systolic function was normal.  - Tricuspid valve: There was severe regurgitation.  - Pulmonary arteries: Systolic pressure was moderately increased.    PA peak pressure: 54 mm Hg (S).  - Inferior vena cava: The vessel was normal in size.  - Pericardium,  extracardiac: There was no pericardial effusion.    Monitor with some skipping and faster HR but no a fib and has resolved with dilt.   08/23/19   Nuc study 03/15/18 no ischemia    CT chest high resolution 12/30/19 There is aortic atherosclerosis, as well as atherosclerosis of the great vessels of the mediastinum and the coronary arteries, including calcified atherosclerotic plaque in the left main, left anterior descending and right coronary arteries.   The appearance of the lungs is compatible with interstitial lung disease, with mild progression of changes compared to the prior study, with a spectrum of findings considered probable usual interstitial pneumonia (UIP) per current ATS guidelines.     Risk Assessment/Calculations:    CHA2DS2-VASc Score = 4  This indicates a 4.8% annual risk of stroke. The patient's score is based upon: CHF History: No HTN History: Yes Diabetes History: No Stroke History: No Vascular Disease History: Yes Age Score: 1 Gender Score: 1        ASSESSMENT:    1. Paroxysmal atrial fibrillation (HCC)   2. DOE (dyspnea on exertion)   3. Coronary artery calcification seen on CT scan   4. Essential hypertension   5. Valvular heart disease   6. Other hyperlipidemia   7. Prediabetes      PLAN:  In order of problems listed above:  PAF 1 occurrence when hospitalized with pulmonary issues no recurrence on follow-up Holter 08/2019.  Has been maintained on aspirin, allergy to metoprolol but now maintained on diltiazem.  Fatigue and shortness of breath when going up stairs relieved with rest or inhalers.  Difficult to sort out whether or not if some of this could be from tachycardia or angina.  She says its not changed over the last 6 months.  We will continue to monitor.  Coronary calcification occasions on CT with normal nuclear study in 2019 on statin  Hypertension blood pressure overall all well controlled but she is on both amlodipine and  diltiazem.  I will stop amlodipine and increase losartan to 50 mg once daily.  Last Bmett was in December.  We will get labs  today.  Valvular heart disease with mild mitral and severe tricuspid regurgitation with moderately elevated pulmonary pressures likely secondary to interstitial lung disease  Hyperlipidemia on atorvastatin  Shared Decision Making/Informed Consent        Medication Adjustments/Labs and Tests Ordered: Current medicines are reviewed at length with the patient today.  Concerns regarding medicines are outlined above.  Medication changes, Labs and Tests ordered today are listed in the Patient Instructions below. There are no Patient Instructions on file for this visit.   Signed, Ermalinda Barrios, PA-C  09/06/2020 [facility] [place], [place], [place]  [address] Phone: [phone]; Fax: [phone]

## 2020-09-06 ENCOUNTER — Encounter: Payer: Self-pay | Admitting: Physician Assistant

## 2020-09-06 ENCOUNTER — Ambulatory Visit (INDEPENDENT_AMBULATORY_CARE_PROVIDER_SITE_OTHER): Payer: Medicare Other | Admitting: Physician Assistant

## 2020-09-06 ENCOUNTER — Other Ambulatory Visit (HOSPITAL_COMMUNITY)
Admission: RE | Admit: 2020-09-06 | Discharge: 2020-09-06 | Disposition: A | Discharge status: Home / Self Care | Payer: Medicare Other | Source: Ambulatory Visit | Attending: Physician Assistant | Admitting: Physician Assistant

## 2020-09-06 ENCOUNTER — Other Ambulatory Visit: Payer: Self-pay

## 2020-09-06 VITALS — BP 124/82 | HR 82 | Ht 65.0 in | Wt 147.2 lb

## 2020-09-06 DIAGNOSIS — E7849 Other hyperlipidemia: Secondary | ICD-10-CM | POA: Insufficient documentation

## 2020-09-06 DIAGNOSIS — I10 Essential (primary) hypertension: Secondary | ICD-10-CM | POA: Diagnosis not present

## 2020-09-06 DIAGNOSIS — I2510 Atherosclerotic heart disease of native coronary artery without angina pectoris: Secondary | ICD-10-CM | POA: Diagnosis not present

## 2020-09-06 DIAGNOSIS — I480 Paroxysmal atrial fibrillation: Secondary | ICD-10-CM

## 2020-09-06 DIAGNOSIS — R7303 Prediabetes: Secondary | ICD-10-CM

## 2020-09-06 DIAGNOSIS — R0600 Dyspnea, unspecified: Secondary | ICD-10-CM

## 2020-09-06 DIAGNOSIS — I38 Endocarditis, valve unspecified: Secondary | ICD-10-CM

## 2020-09-06 DIAGNOSIS — R0609 Other forms of dyspnea: Secondary | ICD-10-CM

## 2020-09-06 LAB — COMPREHENSIVE METABOLIC PANEL
ALT: 56 U/L — ABNORMAL HIGH (ref 0–44)
AST: 46 U/L — ABNORMAL HIGH (ref 15–41)
Albumin: 3.9 g/dL (ref 3.5–5.0)
Alkaline Phosphatase: 66 U/L (ref 38–126)
Anion gap: 11 (ref 5–15)
BUN: 20 mg/dL (ref 8–23)
CO2: 24 mmol/L (ref 22–32)
Calcium: 8.9 mg/dL (ref 8.9–10.3)
Chloride: 101 mmol/L (ref 98–111)
Creatinine, Ser: 0.73 mg/dL (ref 0.44–1.00)
GFR, Estimated: >60 mL/min (ref >60)
Glucose, Bld: 97 mg/dL (ref 70–99)
Potassium: 3.3 mmol/L — ABNORMAL LOW (ref 3.5–5.1)
Sodium: 136 mmol/L (ref 135–145)
Total Bilirubin: 0.5 mg/dL (ref 0.3–1.2)
Total Protein: 7.1 g/dL (ref 6.5–8.1)

## 2020-09-06 LAB — CBC
HCT: 39.3 % (ref 36.0–46.0)
Hemoglobin: 13.1 g/dL (ref 12.0–15.0)
MCH: 30.8 pg (ref 26.0–34.0)
MCHC: 33.3 g/dL (ref 30.0–36.0)
MCV: 92.5 fL (ref 80.0–100.0)
Platelets: 161 10*3/uL (ref 150–400)
RBC: 4.25 MIL/uL (ref 3.87–5.11)
RDW: 13.8 % (ref 11.5–15.5)
WBC: 9.1 10*3/uL (ref 4.0–10.5)
nRBC: 0 % (ref 0.0–0.2)

## 2020-09-06 MED ORDER — LOSARTAN POTASSIUM 50 MG PO TABS: 50.0000 mg | ORAL_TABLET | Freq: Every day | ORAL | 3 refills | Status: DC

## 2020-09-06 NOTE — Patient Instructions (Signed)
Medication Instructions:  Your physician has recommended you make the following change in your medication:   STOP: Amlodipine INCREASE: Losartan to 50mg  daily  *If you need a refill on your cardiac medications before your next appointment, please call your pharmacy*   Lab Work: TODAY: CMET, CBC If you have labs (blood work) drawn today and your tests are completely normal, you will receive your results only by: Marland Kitchen MyChart Message (if you have MyChart) OR . A paper copy in the mail If you have any lab test that is abnormal or we need to change your treatment, we will call you to review the results.  Follow-Up: At [facility], you and your health needs are our priority.  As part of our continuing mission to provide you with exceptional heart care, we have created designated Provider Care Teams.  These Care Teams include your primary Cardiologist (physician) and Advanced Practice Providers (APPs -  Physician Assistants and Nurse Practitioners) who all work together to provide you with the care you need, when you need it.   Your next appointment:   4 month(s)  The format for your next appointment:   In Person  Provider:   Rozann Lesches, MD or Carlyle Dolly, MD

## 2020-09-12 ENCOUNTER — Other Ambulatory Visit: Payer: Self-pay

## 2020-09-12 MED ORDER — POTASSIUM CHLORIDE CRYS ER 10 MEQ PO TBCR: 10.0000 meq | EXTENDED_RELEASE_TABLET | Freq: Every day | ORAL | 1 refills | Status: DC

## 2020-09-21 ENCOUNTER — Encounter: Payer: Self-pay | Admitting: Family Medicine

## 2020-09-21 ENCOUNTER — Ambulatory Visit (INDEPENDENT_AMBULATORY_CARE_PROVIDER_SITE_OTHER): Payer: Medicare Other | Admitting: Family Medicine

## 2020-09-21 ENCOUNTER — Other Ambulatory Visit: Payer: Self-pay

## 2020-09-21 VITALS — BP 139/72 | HR 86 | Temp 97.5°F | Resp 20 | Ht 65.0 in | Wt 146.0 lb

## 2020-09-21 DIAGNOSIS — R7303 Prediabetes: Secondary | ICD-10-CM | POA: Diagnosis not present

## 2020-09-21 DIAGNOSIS — I10 Essential (primary) hypertension: Secondary | ICD-10-CM

## 2020-09-21 DIAGNOSIS — I2510 Atherosclerotic heart disease of native coronary artery without angina pectoris: Secondary | ICD-10-CM

## 2020-09-21 DIAGNOSIS — B351 Tinea unguium: Secondary | ICD-10-CM | POA: Diagnosis not present

## 2020-09-21 DIAGNOSIS — E039 Hypothyroidism, unspecified: Secondary | ICD-10-CM

## 2020-09-21 DIAGNOSIS — E785 Hyperlipidemia, unspecified: Secondary | ICD-10-CM

## 2020-09-21 DIAGNOSIS — Z23 Encounter for immunization: Secondary | ICD-10-CM | POA: Diagnosis not present

## 2020-09-21 MED ORDER — HYDROCHLOROTHIAZIDE 25 MG PO TABS: 25.0000 mg | ORAL_TABLET | Freq: Every day | ORAL | 3 refills | Status: DC

## 2020-09-21 MED ORDER — ATORVASTATIN CALCIUM 20 MG PO TABS: 20.0000 mg | ORAL_TABLET | Freq: Every day | ORAL | 3 refills | Status: DC

## 2020-09-21 MED ORDER — CETIRIZINE HCL 10 MG PO TABS: 10.0000 mg | ORAL_TABLET | Freq: Every day | ORAL | 3 refills | Status: DC | PRN

## 2020-09-21 MED ORDER — LEVOTHYROXINE SODIUM 50 MCG PO TABS: 50.0000 ug | ORAL_TABLET | Freq: Every day | ORAL | 3 refills | Status: DC

## 2020-09-21 NOTE — Progress Notes (Signed)
BP 139/72   Pulse 86   Temp (!) 97.5 F (36.4 C)   Resp 20   Ht 5\' 5"  (1.651 m)   Wt 146 lb (66.2 kg)   SpO2 96%   BMI 24.30 kg/m    Subjective:   [name], female    DOB: 1950/05/22, 70 y.o.   MRN: [id number]  HPI: Tasha [name] is a 70 y.o. female presenting on 09/21/2020 for Medical Management of Chronic Issues (6 mo )   HPI Prediabetes Patient comes in today for recheck of his diabetes. Patient has been currently taking no medication currently. Patient is currently on an ACE inhibitor/ARB. Patient has not seen an ophthalmologist this year. Patient denies any issues with their feet. The symptom started onset as an adult hypothyroidism hypertension hyperlipidemia ARE RELATED TO DM   Hypothyroidism recheck Patient is coming in for thyroid recheck today as well. They deny any issues with hair changes or heat or cold problems or diarrhea or constipation. They deny any chest pain or palpitations. They are currently on levothyroxine 50 micrograms   Hypertension Patient is currently on diltiazem and losartan, and their blood pressure today is 139/72. Patient denies any lightheadedness or dizziness. Patient denies headaches, blurred vision, chest pains, shortness of breath, or weakness. Denies any side effects from medication and is content with current medication.   Hyperlipidemia Patient is coming in for recheck of his hyperlipidemia. The patient is currently taking fish oil and atorvastatin. They deny any issues with myalgias or history of liver damage from it. They deny any focal numbness or weakness or chest pain.   Patient sees cardiology, does have some decreased exertional strength more easily fatigued compared to previous, this been going on for couple years, also sees pulmonology for her lungs and they are working on that but does not feel like it is improved much with the budesonide or Pulmicort.  She will go back and follow-up with both of them.  Relevant  past medical, surgical, family and social history reviewed and updated as indicated. Interim medical history since our last visit reviewed. Allergies and medications reviewed and updated.  Review of Systems  Constitutional: Positive for fatigue. Negative for chills and fever.  Eyes: Negative for visual disturbance.  Respiratory: Negative for chest tightness and shortness of breath.   Cardiovascular: Negative for chest pain and leg swelling.  Musculoskeletal: Negative for back pain and gait problem.  Skin: Positive for color change. Negative for rash.  Neurological: Negative for light-headedness and headaches.  Psychiatric/Behavioral: Negative for agitation and behavioral problems.  All other systems reviewed and are negative.   Per HPI unless specifically indicated above   Allergies as of 09/21/2020      Reactions   Hydromet [hydrocodone Bit-homatrop Mbr]    Pt doesn't remember   Toprol Xl [metoprolol] Hives   Facial rash, itching, hives   Amoxicillin Rash   Did it involve swelling of the face/tongue/throat, SOB, or low BP? No Did it involve sudden or severe rash/hives, skin peeling, or any reaction on the inside of your mouth or nose? Unknown Did you need to seek medical attention at a hospital or doctor's office? Unknown When did it last happen?10 + years If all above answers are "NO", may proceed with cephalosporin use.      Medication List       Accurate as of September 21, 2020 11:14 AM. If you have any questions, ask your nurse or doctor.  STOP taking these medications   azithromycin 250 MG tablet Commonly known as: ZITHROMAX Stopped by: [name] Ares Tegtmeyer, MD   dextromethorphan-guaiFENesin 30-600 MG 12hr tablet Commonly known as: MUCINEX DM Stopped by: [name] Santoria Chason, MD     TAKE these medications   acetaminophen 500 MG tablet Commonly known as: TYLENOL Take 1 tablet (500 mg total) by mouth every 6 (six) hours as needed.   aspirin EC 81 MG  tablet Take 1 tablet (81 mg total) by mouth daily.   atorvastatin 20 MG tablet Commonly known as: LIPITOR Take 1 tablet (20 mg total) by mouth daily at 6 PM.   budesonide 180 MCG/ACT inhaler Commonly known as: PULMICORT Inhale 2 puffs into the lungs in the morning and at bedtime.   cetirizine 10 MG tablet Commonly known as: ZYRTEC Take 1 tablet (10 mg total) by mouth daily as needed for allergies.   Combivent Respimat 20-100 MCG/ACT Aers respimat Generic drug: Ipratropium-Albuterol Inhale 1 puff into the lungs every 6 (six) hours as needed for wheezing.   diltiazem 120 MG 24 hr capsule Commonly known as: CARDIZEM CD Take 1 capsule (120 mg total) by mouth daily.   hydrochlorothiazide 25 MG tablet Commonly known as: HYDRODIURIL Take 1 tablet (25 mg total) by mouth daily.   ibuprofen 200 MG tablet Commonly known as: ADVIL Take 400 mg by mouth every 6 (six) hours as needed for headache or moderate pain.   levothyroxine 50 MCG tablet Commonly known as: SYNTHROID Take 1 tablet (50 mcg total) by mouth daily before breakfast.   losartan 50 MG tablet Commonly known as: COZAAR Take 1 tablet (50 mg total) by mouth daily.   LUBRICATING EYE DROPS OP Place 1 drop into both eyes daily as needed (dry eyes).   Omega 3 1000 MG Caps Take 1,000 mg by mouth daily.   pantoprazole 40 MG tablet Commonly known as: PROTONIX Take 1 tablet (40 mg total) by mouth daily.   potassium chloride 10 MEQ tablet Commonly known as: KLOR-CON Take 1 tablet (10 mEq total) by mouth daily.        Objective:   BP 139/72   Pulse 86   Temp (!) 97.5 F (36.4 C)   Resp 20   Ht 5\' 5"  (1.651 m)   Wt 146 lb (66.2 kg)   SpO2 96%   BMI 24.30 kg/m   Wt Readings from Last 3 Encounters:  09/21/20 146 lb (66.2 kg)  09/06/20 147 lb 3.2 oz (66.8 kg)  03/23/20 145 lb (65.8 kg)    Physical Exam Vitals and nursing note reviewed.  Constitutional:      General: She is not in acute distress.     Appearance: She is well-developed. She is not diaphoretic.  Eyes:     Conjunctiva/sclera: Conjunctivae normal.  Cardiovascular:     Rate and Rhythm: Normal rate. Rhythm irregular.     Heart sounds: Normal heart sounds. No murmur heard.   Pulmonary:     Effort: Pulmonary effort is normal. No respiratory distress.     Breath sounds: Normal breath sounds. No wheezing.  Musculoskeletal:        General: No tenderness. Normal range of motion.  Skin:    General: Skin is warm and dry.     Findings: No rash.     Comments: Thickened and yellow toenails on both the right great toe and the fifth toe  Neurological:     Mental Status: She is alert and oriented to person, place, and time.  Coordination: Coordination normal.  Psychiatric:        Behavior: Behavior normal.     Results for orders placed or performed during the hospital encounter of 09/06/20  CBC  Result Value Ref Range   WBC 9.1 4.0 - 10.5 K/uL   RBC 4.25 3.87 - 5.11 MIL/uL   Hemoglobin 13.1 12.0 - 15.0 g/dL   HCT 65.7 84.6 - 96.2 %   MCV 92.5 80.0 - 100.0 fL   MCH 30.8 26.0 - 34.0 pg   MCHC 33.3 30.0 - 36.0 g/dL   RDW 95.2 84.1 - 32.4 %   Platelets 161 150 - 400 K/uL   nRBC 0.0 0.0 - 0.2 %  Comprehensive metabolic panel  Result Value Ref Range   Sodium 136 135 - 145 mmol/L   Potassium 3.3 (L) 3.5 - 5.1 mmol/L   Chloride 101 98 - 111 mmol/L   CO2 24 22 - 32 mmol/L   Glucose, Bld 97 70 - 99 mg/dL   BUN 20 8 - 23 mg/dL   Creatinine, Ser 4.01 0.44 - 1.00 mg/dL   Calcium 8.9 8.9 - 02.7 mg/dL   Total Protein 7.1 6.5 - 8.1 g/dL   Albumin 3.9 3.5 - 5.0 g/dL   AST 46 (H) 15 - 41 U/L   ALT 56 (H) 0 - 44 U/L   Alkaline Phosphatase 66 38 - 126 U/L   Total Bilirubin 0.5 0.3 - 1.2 mg/dL   GFR, Estimated >25 >36 mL/min   Anion gap 11 5 - 15    Assessment & Plan:   Problem List Items Addressed This Visit      Cardiovascular and Mediastinum   Hypertension     Endocrine   Hypothyroidism     Other   Hyperlipidemia  with target LDL less than 100   Prediabetes - Primary    Other Visit Diagnoses    Onychomycosis       Patient has onychomycosis on her right big toe and little toe, recommended tea tree oil   Essential hypertension          Patient is having some exertional dyspnea, recommended to discuss with cardiology and pulmonology, I do not hear anything on exam abnormal today.  For mucomycosis recommended topical tea tree oil and Lamisil Follow up plan: Return in about 6 months (around 03/23/2021), or if symptoms worsen or fail to improve, for Prediabetes and hypertension recheck.  Counseling provided for all of the vaccine components No orders of the defined types were placed in this encounter.   Arville Care, MD [facility] 09/21/2020, 11:14 AM

## 2020-09-21 NOTE — Addendum Note (Signed)
Addended by: Zannie Cove on: 09/21/2020 11:40 AM   Modules accepted: Orders

## 2020-09-22 ENCOUNTER — Ambulatory Visit (INDEPENDENT_AMBULATORY_CARE_PROVIDER_SITE_OTHER): Payer: Medicare Other

## 2020-09-22 VITALS — Ht 65.0 in | Wt 146.0 lb

## 2020-09-22 DIAGNOSIS — Z0000 Encounter for general adult medical examination without abnormal findings: Secondary | ICD-10-CM | POA: Diagnosis not present

## 2020-09-22 NOTE — Patient Instructions (Signed)
Tasha [name] , Thank you for taking time to come for your Medicare Wellness Visit. I appreciate your ongoing commitment to your health goals. Please review the following plan we discussed and let me know if I can assist you in the future.   Screening recommendations/referrals: Colonoscopy: Done 06/07/2019 - Repeat in 5-10 years Mammogram: Done 04/19/2020 - Repeat annually Bone Density: Done 03/24/2020 - Repeat in 5 years Recommended yearly ophthalmology/optometry visit for glaucoma screening and checkup Recommended yearly dental visit for hygiene and checkup  Vaccinations: Influenza vaccine: Done 01/14/2020 - Repeat annually Pneumococcal vaccine: Done 08/10/2015 & 12/21/2017 Tdap vaccine: Due (every 10 years) Shingles vaccine: First dose done 09/21/2020 - Get second dose at next visit   Covid-19: Done 05/21/19 & 06/18/19 - also had booster - we need date  Advanced directives: Advance directive discussed with you today. Even though you declined this today, please call our office should you change your mind, and we can give you the proper paperwork for you to fill out.  Conditions/risks identified: Aim for 30 minutes of exercise or brisk walking each day, drink 6-8 glasses of water and eat lots of fruits and vegetables.  Next appointment: Follow up in one year for your annual wellness visit    Preventive Care 65 Years and Older, Female Preventive care refers to lifestyle choices and visits with your health care provider that can promote health and wellness. What does preventive care include?  A yearly physical exam. This is also called an annual well check.  Dental exams once or twice a year.  Routine eye exams. Ask your health care provider how often you should have your eyes checked.  Personal lifestyle choices, including:  Daily care of your teeth and gums.  Regular physical activity.  Eating a healthy diet.  Avoiding tobacco and drug use.  Limiting alcohol use.  Practicing safe  sex.  Taking low-dose aspirin every day.  Taking vitamin and mineral supplements as recommended by your health care provider. What happens during an annual well check? The services and screenings done by your health care provider during your annual well check will depend on your age, overall health, lifestyle risk factors, and family history of disease. Counseling  Your health care provider may ask you questions about your:  Alcohol use.  Tobacco use.  Drug use.  Emotional well-being.  Home and relationship well-being.  Sexual activity.  Eating habits.  History of falls.  Memory and ability to understand (cognition).  Work and work Statistician.  Reproductive health. Screening  You may have the following tests or measurements:  Height, weight, and BMI.  Blood pressure.  Lipid and cholesterol levels. These may be checked every 5 years, or more frequently if you are over 79 years old.  Skin check.  Lung cancer screening. You may have this screening every year starting at age 40 if you have a 30-pack-year history of smoking and currently smoke or have quit within the past 15 years.  Fecal occult blood test (FOBT) of the stool. You may have this test every year starting at age 13.  Flexible sigmoidoscopy or colonoscopy. You may have a sigmoidoscopy every 5 years or a colonoscopy every 10 years starting at age 51.  Hepatitis C blood test.  Hepatitis B blood test.  Sexually transmitted disease (STD) testing.  Diabetes screening. This is done by checking your blood sugar (glucose) after you have not eaten for a while (fasting). You may have this done every 1-3 years.  Bone density scan.  This is done to screen for osteoporosis. You may have this done starting at age 71.  Mammogram. This may be done every 1-2 years. Talk to your health care provider about how often you should have regular mammograms. Talk with your health care provider about your test results,  treatment options, and if necessary, the need for more tests. Vaccines  Your health care provider may recommend certain vaccines, such as:  Influenza vaccine. This is recommended every year.  Tetanus, diphtheria, and acellular pertussis (Tdap, Td) vaccine. You may need a Td booster every 10 years.  Zoster vaccine. You may need this after age 11.  Pneumococcal 13-valent conjugate (PCV13) vaccine. One dose is recommended after age 28.  Pneumococcal polysaccharide (PPSV23) vaccine. One dose is recommended after age 72. Talk to your health care provider about which screenings and vaccines you need and how often you need them. This information is not intended to replace advice given to you by your health care provider. Make sure you discuss any questions you have with your health care provider. Document Released: 05/05/2015 Document Revised: 12/27/2015 Document Reviewed: 02/07/2015 Elsevier Interactive Patient Education  2017 [place] Prevention in the Home Falls can cause injuries. They can happen to people of all ages. There are many things you can do to make your home safe and to help prevent falls. What can I do on the outside of my home?  Regularly fix the edges of walkways and driveways and fix any cracks.  Remove anything that might make you trip as you walk through a door, such as a raised step or threshold.  Trim any bushes or trees on the path to your home.  Use bright outdoor lighting.  Clear any walking paths of anything that might make someone trip, such as rocks or tools.  Regularly check to see if handrails are loose or broken. Make sure that both sides of any steps have handrails.  Any raised decks and porches should have guardrails on the edges.  Have any leaves, snow, or ice cleared regularly.  Use sand or salt on walking paths during winter.  Clean up any spills in your garage right away. This includes oil or grease spills. What can I do in the  bathroom?  Use night lights.  Install grab bars by the toilet and in the tub and shower. Do not use towel bars as grab bars.  Use non-skid mats or decals in the tub or shower.  If you need to sit down in the shower, use a plastic, non-slip stool.  Keep the floor dry. Clean up any water that spills on the floor as soon as it happens.  Remove soap buildup in the tub or shower regularly.  Attach bath mats securely with double-sided non-slip rug tape.  Do not have throw rugs and other things on the floor that can make you trip. What can I do in the bedroom?  Use night lights.  Make sure that you have a light by your bed that is easy to reach.  Do not use any sheets or blankets that are too big for your bed. They should not hang down onto the floor.  Have a firm chair that has side arms. You can use this for support while you get dressed.  Do not have throw rugs and other things on the floor that can make you trip. What can I do in the kitchen?  Clean up any spills right away.  Avoid walking on wet floors.  Keep items that you use a lot in easy-to-reach places.  If you need to reach something above you, use a strong step stool that has a grab bar.  Keep electrical cords out of the way.  Do not use floor polish or wax that makes floors slippery. If you must use wax, use non-skid floor wax.  Do not have throw rugs and other things on the floor that can make you trip. What can I do with my stairs?  Do not leave any items on the stairs.  Make sure that there are handrails on both sides of the stairs and use them. Fix handrails that are broken or loose. Make sure that handrails are as long as the stairways.  Check any carpeting to make sure that it is firmly attached to the stairs. Fix any carpet that is loose or worn.  Avoid having throw rugs at the top or bottom of the stairs. If you do have throw rugs, attach them to the floor with carpet tape.  Make sure that you have a  light switch at the top of the stairs and the bottom of the stairs. If you do not have them, ask someone to add them for you. What else can I do to help prevent falls?  Wear shoes that:  Do not have high heels.  Have rubber bottoms.  Are comfortable and fit you well.  Are closed at the toe. Do not wear sandals.  If you use a stepladder:  Make sure that it is fully opened. Do not climb a closed stepladder.  Make sure that both sides of the stepladder are locked into place.  Ask someone to hold it for you, if possible.  Clearly mark and make sure that you can see:  Any grab bars or handrails.  First and last steps.  Where the edge of each step is.  Use tools that help you move around (mobility aids) if they are needed. These include:  Canes.  Walkers.  Scooters.  Crutches.  Turn on the lights when you go into a dark area. Replace any light bulbs as soon as they burn out.  Set up your furniture so you have a clear path. Avoid moving your furniture around.  If any of your floors are uneven, fix them.  If there are any pets around you, be aware of where they are.  Review your medicines with your doctor. Some medicines can make you feel dizzy. This can increase your chance of falling. Ask your doctor what other things that you can do to help prevent falls. This information is not intended to replace advice given to you by your health care provider. Make sure you discuss any questions you have with your health care provider. Document Released: 02/02/2009 Document Revised: 09/14/2015 Document Reviewed: 05/13/2014 Elsevier Interactive Patient Education  2017 [employer].

## 2020-09-22 NOTE — Progress Notes (Signed)
Subjective:   Tasha [name] is a 70 y.o. female who presents for Medicare Annual (Subsequent) preventive examination.  Virtual Visit via Telephone Note  I connected with  Tasha [name] on 09/22/20 at  9:00 AM EDT by telephone and verified that I am speaking with the correct person using two identifiers.  Location: Patient: Home Provider: [facility] Persons participating in the virtual visit: patient, daughter and interpreter, [place] Advisor   I discussed the limitations, risks, security and privacy concerns of performing an evaluation and management service by telephone and the availability of in person appointments. The patient expressed understanding and agreed to proceed.  Interactive audio and video telecommunications were attempted between this nurse and patient, however failed, due to patient having technical difficulties OR patient did not have access to video capability.  We continued and completed visit with audio only.  Some vital signs may be absent or patient reported.   Tasha Nickson E Shahiem Bedwell, LPN  Review of Systems     Cardiac Risk Factors include: advanced age (>42men, >89 women);dyslipidemia;hypertension;sedentary lifestyle     Objective:    Today's Vitals   09/22/20 0840  Weight: 146 lb (66.2 kg)  Height: 5\' 5"  (1.651 m)   Body mass index is 24.3 kg/m.  Advanced Directives 09/22/2020 06/07/2019 12/10/2018 12/16/2017  Does Patient Have a Medical Advance Directive? No No No No  Would patient like information on creating a medical advance directive? No - Patient declined No - Patient declined No - Patient declined No - Patient declined    Current Medications (verified) Outpatient Encounter Medications as of 09/22/2020  Medication Sig  . acetaminophen (TYLENOL) 500 MG tablet Take 1 tablet (500 mg total) by mouth every 6 (six) hours as needed.  Marland Kitchen aspirin EC 81 MG tablet Take 1 tablet (81 mg total) by mouth daily.  Marland Kitchen atorvastatin (LIPITOR) 20 MG tablet Take 1  tablet (20 mg total) by mouth daily at 6 PM.  . budesonide (PULMICORT) 180 MCG/ACT inhaler Inhale 2 puffs into the lungs in the morning and at bedtime.  . Carboxymethylcellul-Glycerin (LUBRICATING EYE DROPS OP) Place 1 drop into both eyes daily as needed (dry eyes).  . cetirizine (ZYRTEC) 10 MG tablet Take 1 tablet (10 mg total) by mouth daily as needed for allergies.  Marland Kitchen diltiazem (CARDIZEM CD) 120 MG 24 hr capsule Take 1 capsule (120 mg total) by mouth daily.  . hydrochlorothiazide (HYDRODIURIL) 25 MG tablet Take 1 tablet (25 mg total) by mouth daily.  Marland Kitchen ibuprofen (ADVIL) 200 MG tablet Take 400 mg by mouth every 6 (six) hours as needed for headache or moderate pain.  . Ipratropium-Albuterol (COMBIVENT RESPIMAT) 20-100 MCG/ACT AERS respimat Inhale 1 puff into the lungs every 6 (six) hours as needed for wheezing.  Marland Kitchen levothyroxine (SYNTHROID) 50 MCG tablet Take 1 tablet (50 mcg total) by mouth daily before breakfast.  . losartan (COZAAR) 50 MG tablet Take 1 tablet (50 mg total) by mouth daily.  . Omega 3 1000 MG CAPS Take 1,000 mg by mouth daily.   . pantoprazole (PROTONIX) 40 MG tablet Take 1 tablet (40 mg total) by mouth daily.  . potassium chloride (KLOR-CON) 10 MEQ tablet Take 1 tablet (10 mEq total) by mouth daily.   No facility-administered encounter medications on file as of 09/22/2020.    Allergies (verified) Hydromet [hydrocodone bit-homatrop mbr], Toprol xl [metoprolol], and Amoxicillin   History: Past Medical History:  Diagnosis Date  . Acute respiratory failure ([place])   . CAP (community acquired pneumonia)   .  Hyperlipidemia   . Hypertension   . Metabolic syndrome    Past Surgical History:  Procedure Laterality Date  . CESAREAN SECTION    . COLONOSCOPY N/A 06/07/2019   Procedure: COLONOSCOPY;  Surgeon: Danie Binder, MD;  [facility];  Service: Endoscopy;  Laterality: N/A;  12:45  . POLYPECTOMY  06/07/2019   Procedure: POLYPECTOMY;  Surgeon: Danie Binder, MD;   Location: [facility];  Service: Endoscopy;;   Family History  Problem Relation Age of Onset  . Cancer Father   . Healthy Brother   . Healthy Daughter   . Healthy Sister   . Memory loss Sister   . Healthy Son   . Healthy Son   . Breast cancer Niece    Social History   Socioeconomic History  . Marital status: Married    Spouse name: Ramiro  . Number of children: 3  . Years of education: 6  . Highest education level: 6th grade  Occupational History  . Occupation: retired  Tobacco Use  . Smoking status: Former Research scientist (life sciences)  . Smokeless tobacco: Never Used  Vaping Use  . Vaping Use: Never used  Substance and Sexual Activity  . Alcohol use: No  . Drug use: No  . Sexual activity: Not Currently    Birth control/protection: Post-menopausal  Other Topics Concern  . Not on file  Social History Narrative   Lives home with her husband - active in church - children live nearby   Social Determinants of Health   Financial Resource Strain: [place]   . Difficulty of Paying Living Expenses: Not hard at all  Food Insecurity: No Food Insecurity  . Worried About Charity fundraiser in the Last Year: Never true  . Ran Out of Food in the Last Year: Never true  Transportation Needs: No Transportation Needs  . Lack of Transportation (Medical): No  . Lack of Transportation (Non-Medical): No  Physical Activity: Insufficiently Active  . Days of Exercise per Week: 3 days  . Minutes of Exercise per Session: 30 min  Stress: No Stress Concern Present  . Feeling of Stress : Not at all  Social Connections: Socially Integrated  . Frequency of Communication with Friends and Family: More than three times a week  . Frequency of Social Gatherings with Friends and Family: More than three times a week  . Attends Religious Services: More than 4 times per year  . Active Member of Clubs or Organizations: Yes  . Attends Archivist Meetings: More than 4 times per year  . Marital Status: Married     Tobacco Counseling Counseling given: Not Answered   Clinical Intake:  Pre-visit preparation completed: Yes  Pain : No/denies pain     BMI - recorded: 24.3 Nutritional Status: BMI of 19-24  Normal Nutritional Risks: None Diabetes: No  How often do you need to have someone help you when you read instructions, pamphlets, or other written materials from your doctor or pharmacy?: 1 - Never  Diabetic? No  Interpreter Needed?: No  Information entered by :: Shaasia Odle, LPN   Activities of Daily Living In your present state of health, do you have any difficulty performing the following activities: 09/22/2020  Hearing? N  Vision? N  Difficulty concentrating or making decisions? N  Walking or climbing stairs? N  Dressing or bathing? N  Doing errands, shopping? N  Preparing Food and eating ? N  Using the Toilet? N  In the past six months, have  you accidently leaked urine? N  Do you have problems with loss of bowel control? N  Managing your Medications? N  Managing your Finances? N  Housekeeping or managing your Housekeeping? N  Some recent data might be hidden    Patient Care Team: Dettinger, Fransisca Kaufmann, MD as PCP - General (Family Medicine) Herminio Commons, MD (Inactive) as PCP - Cardiology (Cardiology) Gala Romney Cristopher Estimable, MD as Consulting Physician (Gastroenterology)  Indicate any recent Medical Services you may have received from other than [facility] providers in the past year (date may be approximate).     Assessment:   This is a routine wellness examination for Tasha [name].  Hearing/Vision screen  Hearing Screening   125Hz  250Hz  500Hz  1000Hz  2000Hz  3000Hz  4000Hz  6000Hz  8000Hz   Right ear:           Left ear:           Comments: Denies hearing difficulties   Vision Screening Comments: Wears eyeglasses - up to date with annual eye exam with Dr Marin Comment in [place]  Dietary issues and exercise activities discussed: Current Exercise Habits: Home exercise routine, Type of  exercise: walking;strength training/weights, Time (Minutes): 30, Frequency (Times/Week): 3, Weekly Exercise (Minutes/Week): 90, Intensity: Moderate, Exercise limited by: None identified  Goals Addressed            This Visit's Progress   . DIET - INCREASE WATER INTAKE   On track    Try to drink 6-8 glasses of water daily.    . Exercise 150 min/wk Moderate Activity   On track     Depression Screen PHQ 2/9 Scores 09/22/2020 09/21/2020 03/23/2020 01/14/2020 10/18/2019 07/14/2019 04/14/2019  PHQ - 2 Score 0 0 0 0 0 0 0  PHQ- 9 Score - - - - - - -    Fall Risk Fall Risk  09/22/2020 09/21/2020 03/23/2020 01/14/2020 10/18/2019  Falls in the past year? 1 1 0 0 0  Comment - - - - -  Number falls in past yr: 0 0 - - -  Injury with Fall? 1 1 - - -  Risk for fall due to : History of fall(s);Impaired vision History of fall(s) - - -  Follow up Education provided;Falls prevention discussed Falls evaluation completed - - -    FALL RISK PREVENTION PERTAINING TO THE HOME:  Any stairs in or around the home? Yes  If so, are there any without handrails? No  Home free of loose throw rugs in walkways, pet beds, electrical cords, etc? Yes  Adequate lighting in your home to reduce risk of falls? Yes   ASSISTIVE DEVICES UTILIZED TO PREVENT FALLS:  Life alert? No  Use of a cane, walker or w/c? No  Grab bars in the bathroom? Yes  Shower chair or bench in shower? Yes  Elevated toilet seat or a handicapped toilet? No   TIMED UP AND GO:  Was the test performed? No . Telephonic visit  Cognitive Function: Normal cognitive status assessed by direct observation by this Nurse Health Advisor. No abnormalities found.   MMSE - Mini Mental State Exam 06/18/2017  Orientation to time 4  Orientation to Place 4  Registration 3  Recall 3  Language- name 2 objects 1  Language- repeat 1  Copy design 1     6CIT Screen 12/10/2018  What Year? 0 points  What month? 0 points  What time? 0 points  Count back from 20 4  points  Months in reverse 0 points  Repeat phrase 4 points  Total Score 8    Immunizations Immunization History  Administered Date(s) Administered  . Fluad Quad(high Dose 65+) 03/01/2019, 01/14/2020  . Influenza, High Dose Seasonal PF 03/11/2017, 03/09/2018  . Influenza,inj,Quad PF,6+ Mos 02/04/2014, 02/09/2015, 02/20/2016  . Moderna Sars-Covid-2 Vaccination 05/21/2019, 06/18/2019  . Pneumococcal Conjugate-13 08/10/2015  . Pneumococcal Polysaccharide-23 06/17/2017, 12/21/2017  . Zoster Recombinat (Shingrix) 09/21/2020    TDAP status: Due, Education has been provided regarding the importance of this vaccine. Advised may receive this vaccine at local pharmacy or Health Dept. Aware to provide a copy of the vaccination record if obtained from local pharmacy or Health Dept. Verbalized acceptance and understanding.  Flu Vaccine status: Up to date  Pneumococcal vaccine status: Up to date  Covid-19 vaccine status: Completed vaccines  Qualifies for Shingles Vaccine? Yes   Zostavax completed No   Shingrix Completed?: No.    Education has been provided regarding the importance of this vaccine. Patient has been advised to call insurance company to determine out of pocket expense if they have not yet received this vaccine. Advised may also receive vaccine at local pharmacy or Health Dept. Verbalized acceptance and understanding.  Screening Tests Health Maintenance  Topic Date Due  . Pneumococcal Vaccine 27-40 Years old (1 of 2 - PPSV23) Never done  . TETANUS/TDAP  09/21/2021 (Originally 04/23/2019)  . Zoster Vaccines- Shingrix (2 of 2) 11/16/2020  . INFLUENZA VACCINE  11/20/2020  . MAMMOGRAM  04/19/2021  . DEXA SCAN  03/24/2025  . COLONOSCOPY (Pts 45-65yrs Insurance coverage will need to be confirmed)  06/06/2029  . Hepatitis C Screening  Completed  . PNA vac Low Risk Adult  Completed  . HPV VACCINES  Aged Out  . COLON CANCER SCREENING ANNUAL FOBT  Discontinued  . COVID-19 Vaccine   Discontinued    Health Maintenance  Health Maintenance Due  Topic Date Due  . Pneumococcal Vaccine 73-55 Years old (1 of 2 - PPSV23) Never done    Colorectal cancer screening: Type of screening: Colonoscopy. Completed 06/07/2019. Repeat every 5/10 years  Mammogram status: Completed 04/19/2020. Repeat every year  Bone Density status: Completed 03/24/2020. Results reflect: Bone density results: NORMAL. Repeat every 5 years.  Lung Cancer Screening: (Low Dose CT Chest recommended if Age 28-80 years, 30 pack-year currently smoking OR have quit w/in 15years.) does not qualify.   Additional Screening:  Hepatitis C Screening: does qualify; Completed 02/20/2016  Vision Screening: Recommended annual ophthalmology exams for early detection of glaucoma and other disorders of the eye. Is the patient up to date with their annual eye exam?  Yes  Who is the provider or what is the name of the office in which the patient attends annual eye exams? Dr Marin Comment in [place] If pt is not established with a provider, would they like to be referred to a provider to establish care? No .   Dental Screening: Recommended annual dental exams for proper oral hygiene  Community Resource Referral / Chronic Care Management: CRR required this visit?  No   CCM required this visit?  No      Plan:     I have personally reviewed and noted the following in the patient's chart:   . Medical and social history . Use of alcohol, tobacco or illicit drugs  . Current medications and supplements including opioid prescriptions.  . Functional ability and status . Nutritional status . Physical activity . Advanced directives . List of other physicians . Hospitalizations, surgeries, and ER visits in previous 12 months . Vitals .  Screenings to include cognitive, depression, and falls . Referrals and appointments  In addition, I have reviewed and discussed with patient certain preventive protocols, quality metrics, and best  practice recommendations. A written personalized care plan for preventive services as well as general preventive health recommendations were provided to patient.     Sandrea Hammond, LPN   07/24/8375   Nurse Notes: None

## 2020-10-20 DIAGNOSIS — Z20822 Contact with and (suspected) exposure to covid-19: Secondary | ICD-10-CM | POA: Diagnosis not present

## 2020-10-21 ENCOUNTER — Other Ambulatory Visit: Payer: Self-pay | Admitting: Physician Assistant

## 2020-10-26 ENCOUNTER — Other Ambulatory Visit: Payer: Self-pay

## 2020-10-26 ENCOUNTER — Ambulatory Visit (INDEPENDENT_AMBULATORY_CARE_PROVIDER_SITE_OTHER): Payer: Medicare Other | Admitting: Internal Medicine

## 2020-10-26 VITALS — BP 150/84 | HR 83 | Ht 65.0 in | Wt 145.4 lb

## 2020-10-26 DIAGNOSIS — J849 Interstitial pulmonary disease, unspecified: Secondary | ICD-10-CM | POA: Diagnosis not present

## 2020-10-26 DIAGNOSIS — R768 Other specified abnormal immunological findings in serum: Secondary | ICD-10-CM

## 2020-10-26 DIAGNOSIS — Z9109 Other allergy status, other than to drugs and biological substances: Secondary | ICD-10-CM

## 2020-10-26 DIAGNOSIS — I2510 Atherosclerotic heart disease of native coronary artery without angina pectoris: Secondary | ICD-10-CM | POA: Diagnosis not present

## 2020-10-26 NOTE — Progress Notes (Signed)
OV 01/22/18 70 year old female, never smoked. PMH  Hypertension, metabolic syndrome, thrombocytopenia, prediabetes, interstitial lung disease, PAF. Not previously followed by [facility] pulmonary care. Admitted to [facility] from outside hospital on 08/27 with acute hypoxic respiratory failure in setting of bilateral pulmonary infiltrates. Work up indicated [place]. Dx subacute ILD vs pneumonia thought to be from exposure to antifreeze leak in patients car. Requiring intubation from 08/30-09/05, chest tube placed due to pneumothorax. [facility]/10- 09/20, required min assistance with mobility and basic self-care tasks. Speech therapy worked on dysphagia, able to tolerate regular diet thin liquids without signs and symptoms of aspiration.    Patient presents today for hospital follow-up visit. Accompanied by her daughter who also provides translation. She is feeling a lot better, no breathing issues. Finished oral steroid today. BS 119. CBC improving. Eating and drinking ok. No difficulties swallowing and denies aspiration. Ambulting with walker. Denies fall, sob, wheezing, cough, fever or chest pain.    Results for Tasha, [name] (MRN [id number]) as of 02/19/2018 10:01  Ref. Range 12/16/2017 19:24 12/17/2017 18:59  Sed Rate Latest Ref Range: 0 - 22 mm/hr 76 (H) 69 (H)  Results for Tasha, [name] (MRN [id number]) as of 02/19/2018 10:01  Ref. Range 12/17/2017 18:59  Speckled Pattern Unknown 1:320 (H)  Results for Tasha, [name] (MRN [id number]) as of 02/19/2018 10:01  Ref. Range 12/19/2017 14:15  Color, Fluid Latest Ref Range: YELLOW  COLORLESS (A)  WBC, Fluid Latest Ref Range: 0 - 1,000 cu mm 93  Lymphs, Fluid Latest Units: % 78  Eos, Fluid Latest Units: % 2  Appearance, Fluid Latest Ref Range: CLEAR  CLEAR (A)  Neutrophil Count, Fluid Latest Ref Range: 0 - 25 % 7  Monocyte-Macrophage-Serous Fluid Latest Ref Range: 50 - 90 % 13 (L)     OV  02/19/2018  Subjective:  [name], female , DOB: 07-Jun-1950 , age 53 y.o. , MRN: [id number] , ADDRESS: [address] [place] [address]   02/19/2018 -   Chief Complaint  Patient presents with   Follow-up    States she still has dry cough, states the SOB has resolved. Denies chest pain or idscomfort.      HPI Tasha [name] 70 y.o. -accompanied by her daughter Verdis Frederickson.  End of August 2019 she suffered acute lung injury possibly Boop based on the nature of infiltrates and a high ESR and a positive ANA [BAL lymphocytosis].  There is some history of antifreeze exposure.  She has recovered from it and is now at home.  She is brought in by Dr. Verdis Frederickson.  Verdis Frederickson is doing the translation.  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  Post discharge in the hospital patient is off oxygen and is overall better.  Steroids ended 2 weeks ago but since the completion of steroids she feels the cough is coming back and getting worse.  Currently cough is moderate in intensity and without any sputum production.  2 days ago her primary care physician apparently changed 1 of her antihypertensives because of the cough.  I am unable to determine which one but I suspect it was an ACE inhibitor.  She is currently on losartan.  There is no fever or chills.  Current walking desaturation test appears adequate        ROS  OV 03/31/2018  Subjective:  [name], female , DOB: 1951/04/04 , age  66 y.o. , MRN: [id number] , ADDRESS: [place] [address]   03/31/2018 -   Chief Complaint  Patient presents with   Follow-up    pt states breathing is doing well. c/o non prod cough.     HPI Tasha [name] 70 y.o. -presents for interstitial lung disease acute with lymphocytic bronchoalveolar lavage after antifreeze exposure  After last visit she showed improvement.  We repeated ANA and this was negative.  We also did some extra  antibody such as anti-Jo 1 and this was negative.  Her ESR has normalized.  In the interim she continues to improve.  Shortness of breath is almost nonexistent.  Cough is improved but she still has mild residual cough.  She had high-resolution CT scan of the chest #2019 that I personally visualized and there is still some residual groundglass opacities along with some evolving fibrotic pattern.  It seems more predominant in the lung base.  Her her son Shellee Milo is with her.  History is gained from talking to interpreter on the video camera.  His name is Leanna Sato.  She denies any diabetes or hyperlipidemia or stroke in the past.  She does not have any mood swings.  In the past she has tolerated prednisone without problem.  Denies any bony issues.   OV 06/11/2018  Subjective:  [name], female , DOB: [birth year] , age 23 y.o. , MRN: [id number] , ADDRESS: [address] [place] [address]  Tasha [name] 70 y.o. -presents for interstitial lung disease acute with lymphocytic bronchoalveolar lavage after antifreeze exposure 06/11/2018 -   Chief Complaint  Patient presents with   Follow-up    PFT performed today.  Pt states she has been doing well since last visit and denies any complaints.      HPI Tasha [name] 70 y.o. -follow-up.  At last visit in December 2019 I placed her on a 10-week prednisone course.  In between she came and saw a nurse practitioner approximately just over a month ago.  She was tolerating the prednisone fine.  It is now 4 weeks and she finished the prednisone.  According to the daughter who acted as the interpreter the prednisone really did help with her symptoms.  It improved her shortness of breath.  Currently she is nearly asymptomatic.  Her current symptoms are documented below.  She is feeling good.  She had pulmonary function test today with spirometry being normal and DLCO being near normal.  She is noted to be on ACE inhibitor but she denies any cough.  Last CT  scan of the chest was November 2019    OV 12/07/2019   Subjective:  [name], female , DOB: Aug 27, 1950, age 69 y.o. years. , MRN: [id number],  ADDRESS: [place] [address] PCP  Dettinger, Fransisca Kaufmann, MD Providers : Treatment Team:  Attending Provider: Brand Males, MD presents for interstitial lung disease acute with lymphocytic bronchoalveolar lavage after antifreeze exposure in aug 2019  Chief Complaint  Patient presents with   Follow-up    Last seen 06/11/2018. Pt states she has been about the same since last visit. Pt is still having complaints of cough and is coughing up occ white phlegm. Pt also has increased SOB when she is in enclosed areas that makes her feel like she cannot breathe.       HPI Tasha [name] 70 y.o. -presents with her daughter-in-law is for a face-to-face visit.  The interpreter is Mickel Baas.  Mickel Baas is  from [facility] health.  It appears that overall since her last visit in February 2020 patient is stable.  In August 2020 she had a CT scan of the chest that shows presence of mild ongoing ILD.  She tells [place] that she is had the Covid vaccine.  She has been exercising at the [facility].  She does not use oxygen.  She has mild shortness of breath with exertion when she bends down and some mild cough but overall compared to 2019 she is slowly progressively better.  She does have some dry cough when she is sitting as well.  She needs to drink water to improve that.  There are no other new medical issues.  Her walking desaturation test is unchanged compared to February 2020.   however when she did the interstitial lung disease symptom questionnaire symptoms are significantly worse than before.  She really cannot pinpoint if she is definitely worse or not.  She did admit that she has significant environmental allergies to pollen dust and perfumes.  This is a new history.   CT chest high resolution Aug 2020 COMPARISON:  03/11/2018, 12/17/2017    FINDINGS: Cardiovascular: Aortic atherosclerosis. Normal heart size. Scattered coronary artery calcifications. No pericardial effusion.   Mediastinum/Nodes: No enlarged mediastinal, hilar, or axillary lymph nodes. Thyroid gland, trachea, and esophagus demonstrate no significant findings.   Lungs/Pleura: There is a mild pattern of pulmonary fibrosis with an apical to basal gradient featuring mild tubular bronchiectasis, irregular peripheral interstitial opacity with some superimposed ground-glass, and some evidence of bronchiolectasis in the lung bases. There is no overt honeycombing. No significant air trapping on expiratory phase imaging. There is a densely calcified benign pulmonary nodule of the left lung base. No pleural effusion or pneumothorax.  IMPRESSION: 1. There is a mild pattern of pulmonary fibrosis with an apical to basal gradient featuring mild tubular bronchiectasis, irregular peripheral interstitial opacity with some superimposed ground-glass, and some evidence of bronchiolectasis in the lung bases. There is no overt honeycombing. No significant air trapping on expiratory phase imaging. Findings are not significantly changed compared to prior examination and consistent with an "indeterminate for UIP" pattern fibrosis by ATS pulmonary fibrosis criteria.   2.  Aortic atherosclerosis and coronary artery disease.     Electronically Signed   By: Eddie Candle M.D.   On: 12/10/2018 12:13  Results for Tasha, [name] (MRN [id number]) as of 06/11/2018 12:13  Ref. Range 06/11/2018 10:31  FVC-Pre Latest Units: L 2.05  FVC-%Pred-Pre Latest Units: % 74  FEV1-Pre Latest Units: L 1.88  FEV1-%Pred-Pre Latest Units: % 90  Pre FEV1/FVC ratio Latest Units: % 92   Results for GABBY, RACKERS (MRN [id number]) as of 06/11/2018 12:13  Ref. Range 06/11/2018 10:31  DLCO unc Latest Units: ml/min/mmHg 13.22  DLCO unc % pred Latest Units: % 74  Results for NYIA, TSAO (MRN  [id number]) as of 03/31/2018 16:37  Ref. Range 12/16/2017 19:24 12/17/2017 18:59 02/19/2018 10:53  Sed Rate Latest Ref Range: 0 - 30 mm/hr 76 (H) 69 (H) 18  Results for PARYS, ELENBAAS (MRN [id number]) as of 03/31/2018 16:37  Ref. Range 02/19/2018 10:53 02/19/2018 11:03  Anti Nuclear Antibody(ANA) Latest Ref Range: Negative   Negative  Anti JO-1 Latest Ref Range: 0.0 - 0.9 AI  <0.2  ds DNA Ab Latest Units: IU/mL <1   Results for SAMMYE, STAFF (MRN [id number]) as of 01/25/2020 16:46  Ref. Range 11/25/2018 09:57 01/25/2019 09:31  Anti Nuclear Antibody (ANA) Latest Ref Range: NEGATIVE   POSITIVE (A)  ANA Pattern 1 Unknown  Nuclear, Nucleolar (A)  ANA Titer 1 Latest Units: titer Positive (A) 6:144 (H)  Cyclic Citrullin Peptide Ab Latest Units: UNITS 3 <16  ds DNA Ab Latest Units: IU/mL  <1  RA Latex Turbid. Latest Ref Range: <14 IU/mL 10.9 <14  ENA SM Ab Ser-aCnc Latest Ref Range: <1.0 NEG AI  <1.0 NEG  Homogeneous Pattern Unknown 1:160 (H)   Speckled Pattern Unknown 1:320 (H)       OV 01/25/2020   Subjective:  [name], female , DOB: April 09, 1951, age 70 y.o. years. , MRN: [id number],  ADDRESS: [place] [id number] PCP  Dettinger, Fransisca Kaufmann, MD Providers : Treatment Team:  Attending Provider: Brand Males, MD   Chief Complaint  Patient presents with   Follow-up    cough decreased     August 2019 she suffered acute lung injury possibly Boop based on the nature of infiltrates and a high ESR and a positive ANA [BAL lymphocytosis].  There is some history of antifreeze exposure. -> ILD  Hx of env allergies - blood allergy test Aug 2021 - igE normal / but positive for Grass  Intermittent ANA positive - last +ve late 2020   HPI Tasha [name] 70 y.o. -presents with her daughter in law Early Chars and also the interpreter Vicente Males.  This visit has been scheduled because last visit she had increased symptoms.  Therefore we did a high-resolution CT chest Dr.  Weber Cooks the radiologist thinks the slight progression.  However we also repeated pulmonary function test improvement/stabilization.  In terms of symptoms patient tells me her symptoms are actually improved.  ILD symptom score actually shows improvement.  She attributes this to exercise.  We did walking desaturation test and it is stable.  Glucotrol documented below.  He understands that she has pulmonary fibrosis.  In fact she says that she looked at [employer] and got extremely worried about pulmonary fibrosis.  At this point in time she denies any symptoms of connective tissue disease.  Overall she is stable.  She continues to have cough.  We did blood allergy work-up and is positive for grass mold IgE is normal.  She was supposed to exam nitric oxide test but this has not been done.  She has had a Covid vaccine and flu shot.  She had to have a booster counseled her about that.   No results found for: NITRICOXIDE     IMPRESSION: 1. The appearance of the lungs is compatible with interstitial lung disease, with mild progression of changes compared to the prior study, with a spectrum of findings considered probable usual interstitial pneumonia (UIP) per current ATS guidelines. 2. Aortic atherosclerosis, in addition to left main and 2 vessel coronary artery disease. Please note that although the presence of coronary artery calcium documents the presence of coronary artery disease, the severity of this disease and any potential stenosis cannot be assessed on this non-gated CT examination. Assessment for potential risk factor modification, dietary therapy or pharmacologic therapy may be warranted, if clinically indicated.   Aortic Atherosclerosis (ICD10-I70.0).     Electronically Signed   By: Vinnie Langton M.D.   On: 12/31/2019 08:22 OV 10/26/2020  Subjective:  [name], female , DOB: Jun 02, 1950 , age 89 y.o. , MRN: [id number] , ADDRESS: [address] [place]  [id number] PCP Dettinger, Fransisca Kaufmann, MD Patient Care Team: Dettinger, Fransisca Kaufmann, MD as PCP - General (Family Medicine) Herminio Commons, MD (Inactive) as PCP -  Cardiology (Cardiology) Gala Romney Cristopher Estimable, MD as Consulting Physician (Gastroenterology)  This Provider for this visit: Treatment Team:  Attending Provider: Brand Males, MD    10/26/2020 -   Chief Complaint  Patient presents with   Follow-up    Pt states she is feeling better since last visit. States that she does have some SOB but after using her inhaler twice a day she feels fine.    August 2019 she suffered acute lung injury possibly Boop based on the nature of infiltrates and a high ESR and a positive ANA [BAL lymphocytosis].  There is some history of antifreeze exposure. -> ILD  Hx of env allergies - blood allergy test Aug 2021 - igE normal / but positive for Grass  Intermittent ANA positive - last +ve late 2020   HPI Tasha [name] 70 y.o. -returns for follow-up with her daughter-in-law Early Chars.  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.  She feels the Pulmicort is helping her.  In fact symptoms improved after Pulmicort.  There are no interim health issues.  Symptom score is stable.  Walking desaturation test is stable.  Previously we checked ANA and is again positive the entire ANA profile as listed.  On exam she still has bilateral bibasal crackles      SYMPTOM SCALE - ILD 06/11/2018  12/07/2019  01/25/2020 Last Weight  Most recent update: 01/25/2020  4:14 PM    Weight  65.1 kg (143 lb 9.6 oz)             7/7/2022145#  O2 use ra ra ra ra  Shortness of Breath 0 -> 5 scale with 5 being worst (score 6 If unable to do)     At rest 0 0 0 0  Simple tasks - showers, clothes change, eating, shaving 0 3 0 0  Household (dishes, doing bed, laundry) 0 3 3 03  Shopping _0 03  Walking level at own pace 0 4 0 0  Walking stairs 0 _1 Total (40 - 48) Dyspnea Score _2 How bad is your cough? 0 _3 How bad is your fatigue _4 nausea  0 0 0  vomit  0 0 0  diarrhea  0 0 0  anzity  _5 Dep[resssio  meds 0 0      Simple office walk 185 feet x  3 laps goal with forehead probe 02/19/2018  03/31/2018  06/11/2018  12/07/2019  01/25/2020  10/26/2020   O2 used rooom air Room air Room air ra ra ra  Number laps completed 3 3 x 250 feet 3 x 236 feet 3 laops 3 3  Comments about pace Moderate pace Mod pace Normal pace avg pace  Avg pace  Resting Pulse Ox/HR 99% and 71/min 100% and 89/min 100% and 83/mn 100% and 83/min 99% and 99/min 99% and 83  Final Pulse Ox/HR 95% and 92/min 98% and 111 98% and 101/min 98% and 101/min 98% and 107/min 97% and 99  Desaturated </= 88% _6  non  Desaturated <= 3% points yes no no no none no  Got Tachycardic >/= 90/min _7  yes  Symptoms at end of test none none none Mild dyspnea none noe  Miscellaneous comments x x x        ROSResults for JESIAH, YERBY (MRN [id number]) as of 10/26/2020 16:17  Ref. Range 12/19/2017 19:00 12/21/2017 09:17 02/19/2018  10:53 02/19/2018 11:03 11/25/2018 09:57 01/25/2019 09:31 12/07/2019 11:55 01/26/2020 11:13  Anti Nuclear Antibody (ANA) Latest Ref Range: NEGATIVE     Negative  POSITIVE (A)  POSITIVE (A)  ANA Pattern 1 Unknown      Nuclear, Nucleolar (A)  Nuclear, Nucleolar (A)  ANA Titer 1 Latest Units: titer     Positive (A) 1:640 (H)  1:640 (H)  Angiotensin-Converting Enzyme Latest Ref Range: 9 - 67 U/L        16  Anti JO-1 Latest Ref Range: 0.0 - 0.9 AI    <4.9      Cyclic Citrullin Peptide Ab Latest Units: UNITS     3 <16  <16  ds DNA Ab Latest Units: IU/mL   <1   <1  <1  DNA-Histone Latest Ref Range: 0.0 - 0.9 Units  0.9        RA Latex Turbid. Latest Ref Range: <14 IU/mL     10.9 <14    ENA SM Ab Ser-aCnc Latest Ref Range: <1.0 NEG AI      <1.0 NEG    Homogeneous Pattern Unknown     1:160 (H)     Speckled Pattern Unknown     1:320 (H)     NOTE: Unknown     Comment     ANA,IFA RA DIAG PNL  W/RFLX TIT/PATN Unknown     Rpt (A)      - per HPI   PFT  PFT Results Latest Ref Rng & Units 12/10/2019 06/11/2018  FVC-Pre L 2.13 2.05  FVC-Predicted Pre % 79 74  FVC-Post L 2.18 -  FVC-Predicted Post % 81 -  Pre FEV1/FVC % % 91 92  Post FEV1/FCV % % 92 -  FEV1-Pre L 1.94 1.88  FEV1-Predicted Pre % 96 90  FEV1-Post L 2.01 -  DLCO uncorrected ml/min/mmHg - 13.22  DLCO UNC% % - 74  DLVA Predicted % - 95       has a past medical history of Acute respiratory failure ([place]), CAP (community acquired pneumonia), Hyperlipidemia, Hypertension, and Metabolic syndrome.   reports that she has quit smoking. She has never used smokeless tobacco.  Past Surgical History:  Procedure Laterality Date   CESAREAN SECTION     COLONOSCOPY N/A 06/07/2019   Procedure: COLONOSCOPY;  Surgeon: Danie Binder, MD;  Location[facility];  Service: Endoscopy;  Laterality: N/A;  12:45   POLYPECTOMY  06/07/2019   Procedure: POLYPECTOMY;  Surgeon: Danie Binder, MD;  Location: [facility];  Service: Endoscopy;;    Allergies  Allergen Reactions   Hydromet [Hydrocodone Bit-Homatrop Mbr]     Pt doesn't remember   Toprol Xl [Metoprolol] Hives    Facial rash, itching, hives   Amoxicillin Rash    Did it involve swelling of the face/tongue/throat, SOB, or low BP? No Did it involve sudden or severe rash/hives, skin peeling, or any reaction on the inside of your mouth or nose? Unknown Did you need to seek medical attention at a hospital or doctor's office? Unknown When did it last happen?      10 + years If all above answers are "NO", may proceed with cephalosporin use.     Immunization History  Administered Date(s) Administered   Fluad Quad(high Dose 65+) 03/01/2019, 01/14/2020   Influenza, High Dose Seasonal PF 03/11/2017, 03/09/2018   Influenza,inj,Quad PF,6+ Mos 02/04/2014, 02/09/2015, 02/20/2016   Moderna Sars-Covid-2 Vaccination 05/21/2019, 06/18/2019   Pneumococcal Conjugate-13 08/10/2015    Pneumococcal Polysaccharide-23 06/17/2017, 12/21/2017  Zoster Recombinat (Shingrix) 09/21/2020    Family History  Problem Relation Age of Onset   Cancer Father    Healthy Brother    Healthy Daughter    Healthy Sister    Memory loss Sister    Healthy Son    Healthy Son    Breast cancer Niece      Current Outpatient Medications:    aspirin EC 81 MG tablet, Take 1 tablet (81 mg total) by mouth daily., Disp: 90 tablet, Rfl: 3   atorvastatin (LIPITOR) 20 MG tablet, Take 1 tablet (20 mg total) by mouth daily at 6 PM., Disp: 90 tablet, Rfl: 3   budesonide (PULMICORT) 180 MCG/ACT inhaler, Inhale 2 puffs into the lungs in the morning and at bedtime., Disp: 1 each, Rfl: 3   Carboxymethylcellul-Glycerin (LUBRICATING EYE DROPS OP), Place 1 drop into both eyes daily as needed (dry eyes)., Disp: , Rfl:    cetirizine (ZYRTEC) 10 MG tablet, Take 1 tablet (10 mg total) by mouth daily as needed for allergies., Disp: 90 tablet, Rfl: 3   hydrochlorothiazide (HYDRODIURIL) 25 MG tablet, Take 1 tablet (25 mg total) by mouth daily., Disp: 90 tablet, Rfl: 3   ibuprofen (ADVIL) 200 MG tablet, Take 400 mg by mouth every 6 (six) hours as needed for headache or moderate pain., Disp: , Rfl:    Ipratropium-Albuterol (COMBIVENT RESPIMAT) 20-100 MCG/ACT AERS respimat, Inhale 1 puff into the lungs every 6 (six) hours as needed for wheezing., Disp: 4 g, Rfl: 5   levothyroxine (SYNTHROID) 50 MCG tablet, Take 1 tablet (50 mcg total) by mouth daily before breakfast., Disp: 90 tablet, Rfl: 3   losartan (COZAAR) 50 MG tablet, Take 1 tablet (50 mg total) by mouth daily., Disp: 90 tablet, Rfl: 3   Omega 3 1000 MG CAPS, Take 1,000 mg by mouth daily. , Disp: , Rfl:    pantoprazole (PROTONIX) 40 MG tablet, Take 1 tablet (40 mg total) by mouth daily., Disp: 90 tablet, Rfl: 3   potassium chloride (KLOR-CON) 10 MEQ tablet, Take 1 tablet (10 mEq total) by mouth daily., Disp: 90 tablet, Rfl: 1   acetaminophen (TYLENOL) 500 MG  tablet, Take 1 tablet (500 mg total) by mouth every 6 (six) hours as needed. (Patient not taking: Reported on 10/26/2020), Disp: 30 tablet, Rfl: 0      Objective:   Vitals:   10/26/20 1542  BP: (!) 150/84  Pulse: 83  SpO2: 99%  Weight: 145 lb 6.4 oz (66 kg)  Height: _0  (1.651 m)    Estimated body mass index is 24.2 kg/m as calculated from the following:   Height as of this encounter: _1  (1.651 m).   Weight as of this encounter: 145 lb 6.4 oz (66 kg).  _2 @  Filed Weights   10/26/20 1542  Weight: 145 lb 6.4 oz (66 kg)     Physical Exam  General: No distress. Looks well Neuro: Alert and Oriented x 3. GCS 15. Speech normal Psych: Pleasant Resp:  Barrel Chest - no.  Wheeze - no, Crackles - YES basal, No overt respiratory distress CVS: Normal heart sounds. Murmurs - no Ext: Stigmata of Connective Tissue Disease - no HEENT: Normal upper airway. PEERL +. No post nasal drip        Assessment:       ICD-10-CM   1. Interstitial lung disease (HCC)  J84.9 Pulmonary function test    2. ANA positive  R76.8     3. History of environmental allergies  Z91.09          Plan:     Patient Instructions     ICD-10-CM   1. Interstitial lung disease ([place])  J84.9     2. ANA positive  R76.8     3. History of environmental allergies  Z91.09     Interstitial lung disease ([place])   - -BAL lymphocytosis August 2019 following antifreeze exposure and ANA positivity History of environmental allergies   -Interstitial lung disease otherwise call pulmonary fibrosis is still there.  Based on his symptoms, walking desaturation test on pulmonary function test things are stable   Glad pulmicort is helping subjectively  Plan -Overall plan at this rate is to keep a close eye on his situation  -if there is any progression at any time then we will discuss biopsy versus starting an antifibrotic  -Repeat spirometry and DLCO in 6 months  Follow-up -6 months with Dr.  Chase Caller 30-minute slot but after spirometry and DLCO  -ILD symptom score and simple walking desaturation test at the time of follow-up  -  Interpreter needed at follow-up     SIGNATURE    Dr. Brand Males, M.D., F.C.C.P,  Pulmonary and Critical Care Medicine Staff Physician, [place] Director - Interstitial Lung Disease  Program  Pulmonary [place] [facility] [place], [place], [address]  Pager: [phone], If no answer or between  15:00h - 7:00h: call [phone] Telephone: [phone]  4:37 PM 10/26/2020 [place]

## 2020-10-26 NOTE — Patient Instructions (Addendum)
ICD-10-CM   1. Interstitial lung disease ([place])  J84.9     2. ANA positive  R76.8     3. History of environmental allergies  Z91.09     Interstitial lung disease ([place])   - -BAL lymphocytosis August 2019 following antifreeze exposure and ANA positivity History of environmental allergies   -Interstitial lung disease otherwise call pulmonary fibrosis is still there.  Based on his symptoms, walking desaturation test on pulmonary function test things are stable   Glad pulmicort is helping subjectively  Plan -Overall plan at this rate is to keep a close eye on his situation  -if there is any progression at any time then we will discuss biopsy versus starting an antifibrotic  -Repeat spirometry and DLCO in 6 months  Follow-up -6 months with Dr. Chase Caller 30-minute slot but after spirometry and DLCO  -ILD symptom score and simple walking desaturation test at the time of follow-up  -  Interpreter needed at follow-up

## 2021-01-16 ENCOUNTER — Ambulatory Visit (INDEPENDENT_AMBULATORY_CARE_PROVIDER_SITE_OTHER): Payer: Medicare Other

## 2021-01-16 ENCOUNTER — Other Ambulatory Visit: Payer: Self-pay

## 2021-01-16 DIAGNOSIS — Z23 Encounter for immunization: Secondary | ICD-10-CM

## 2021-01-31 ENCOUNTER — Ambulatory Visit (INDEPENDENT_AMBULATORY_CARE_PROVIDER_SITE_OTHER): Payer: Medicare Other | Admitting: Cardiology

## 2021-01-31 ENCOUNTER — Ambulatory Visit (INDEPENDENT_AMBULATORY_CARE_PROVIDER_SITE_OTHER): Payer: Medicare Other

## 2021-01-31 ENCOUNTER — Other Ambulatory Visit: Payer: Self-pay

## 2021-01-31 ENCOUNTER — Other Ambulatory Visit (HOSPITAL_COMMUNITY)
Admission: RE | Admit: 2021-01-31 | Discharge: 2021-01-31 | Disposition: A | Discharge status: Home / Self Care | Payer: Medicare Other | Source: Ambulatory Visit | Attending: Cardiology | Admitting: Cardiology

## 2021-01-31 ENCOUNTER — Encounter: Payer: Self-pay | Admitting: Cardiology

## 2021-01-31 VITALS — BP 144/80 | HR 73 | Wt 145.6 lb

## 2021-01-31 DIAGNOSIS — R079 Chest pain, unspecified: Secondary | ICD-10-CM | POA: Diagnosis not present

## 2021-01-31 DIAGNOSIS — I38 Endocarditis, valve unspecified: Secondary | ICD-10-CM | POA: Diagnosis not present

## 2021-01-31 DIAGNOSIS — R55 Syncope and collapse: Secondary | ICD-10-CM | POA: Diagnosis not present

## 2021-01-31 DIAGNOSIS — I209 Angina pectoris, unspecified: Secondary | ICD-10-CM

## 2021-01-31 DIAGNOSIS — I480 Paroxysmal atrial fibrillation: Secondary | ICD-10-CM

## 2021-01-31 LAB — BASIC METABOLIC PANEL
Anion gap: 6 (ref 5–15)
BUN: 17 mg/dL (ref 8–23)
CO2: 27 mmol/L (ref 22–32)
Calcium: 9.2 mg/dL (ref 8.9–10.3)
Chloride: 105 mmol/L (ref 98–111)
Creatinine, Ser: 0.76 mg/dL (ref 0.44–1.00)
GFR, Estimated: >60 mL/min (ref >60)
Glucose, Bld: 102 mg/dL — ABNORMAL HIGH (ref 70–99)
Potassium: 3.3 mmol/L — ABNORMAL LOW (ref 3.5–5.1)
Sodium: 138 mmol/L (ref 135–145)

## 2021-01-31 MED ORDER — METOPROLOL TARTRATE 100 MG PO TABS: ORAL_TABLET | ORAL | 0 refills | Status: DC

## 2021-01-31 MED ORDER — DILTIAZEM HCL 60 MG PO TABS: ORAL_TABLET | ORAL | 0 refills | Status: DC

## 2021-01-31 NOTE — Patient Instructions (Addendum)
Medication Instructions:   Take Diltiazem 60 mg 2 hours before cardiac CT   *If you need a refill on your cardiac medications before your next appointment, please call your pharmacy*   Lab Work:  BMET  If you have labs (blood work) drawn today and your tests are completely normal, you will receive your results only by: [place] (if you have MyChart) OR A paper copy in the mail If you have any lab test that is abnormal or we need to change your treatment, we will call you to review the results.   Testing/Procedures: Your physician has requested that you have an echocardiogram. Echocardiography is a painless test that uses sound waves to create images of your heart. It provides your doctor with information about the size and shape of your heart and how well your heart's chambers and valves are working. This procedure takes approximately one hour. There are no restrictions for this procedure.   Your physician has requested that you have cardiac CT. Cardiac computed tomography (CT) is a painless test that uses an x-ray machine to take clear, detailed pictures of your heart. For further information please visit [email or web address]. Please follow instruction sheet as given.     ZIO XT- Long Term Monitor Instructions   Your physician has requested you wear your ZIO patch monitor_____14__days.   This is a single patch monitor.  Irhythm supplies one patch monitor per enrollment.  Additional stickers are not available.   Please do not apply patch if you will be having a Nuclear Stress Test, Echocardiogram, Cardiac CT, MRI, or Chest Xray during the time frame you would be wearing the monitor. The patch cannot be worn during these tests.  You cannot remove and re-apply the ZIO XT patch monitor.  upward.       Do not shower for the first 24 hours.  You may shower after the first 24 hours.   Press button if you feel a symptom. You will hear a small click.  Record Date, Time and  Symptom in the Patient Log Book.   When you are ready to remove patch, follow instructions on last 2 pages of Patient Log Book.  Stick patch monitor onto last page of Patient Log Book.   Place Patient Log Book in [place] box.  Use locking tab on box and tape box closed securely.  The Orange and [employer] has [employer] on it.  Please place in mailbox as soon as possible.  Your physician should have your test results approximately 7 days after the monitor has been mailed back to [facility].   Call [place] at [phone] if you have questions regarding your ZIO XT patch monitor.  Call them immediately if you see an orange light blinking on your monitor.   If your monitor falls off in less than 4 days contact our Monitor department at [phone].  If your monitor becomes loose or falls off after 4 days call Irhythm at [phone] for suggestions on securing your monitor.       Follow-Up: At [facility], you and your health needs are our priority.  As part of our continuing mission to provide you with exceptional heart care, we have created designated Provider Care Teams.  These Care Teams include your primary Cardiologist (physician) and Advanced Practice Providers (APPs -  Physician Assistants and Nurse Practitioners) who all work together to provide you with the care you need, when you need it.  We recommend signing up for the patient  portal called "MyChart".  Sign up information is provided on this After Visit Summary.  MyChart is used to connect with patients for Virtual Visits (Telemedicine).  Patients are able to view lab/test results, encounter notes, upcoming appointments, etc.  Non-urgent messages can be sent to your provider as well.   To learn more about what you can do with MyChart, go to [email or web address].    Your next appointment:   3 month(s)  The format for your next appointment:   In Person  Provider:   Dr.Schumann    Other  Instructions None

## 2021-01-31 NOTE — Progress Notes (Signed)
Cardiology Office Note    Date:  01/31/2021   ID:  Tasha [name] Sep 17, 1950, MRN [id number]   PCP:  Dettinger, [name], MD   [facility]  Cardiologist:  Little Ishikawa, MD  Advanced Practice Provider:  No care team member to display Electrophysiologist:  None  [phone]   Chief Complaint  Patient presents with   Follow-up   Loss of Consciousness    History of Present Illness:  Tasha [name] is a 70 y.o. female with history of hypertension, PAF 1 occurrence in the setting of pulmonary stress no repeat on monitor continued on aspirin, coronary calcifications on CT in 2019 normal NST 02/2018, thrombocytopenia, ILD  Patient with short of breath and palpitations 07/2019 low-dose Toprol-XL 25 mg half tablet added.  Monitor showed heart rate 53-137 with PACs rare PVCs and brief burst of SVT. Developed rash and changed to diltiazem.  Echocardiogram 12/18/2017 showed EF 65 to 70%, grade 1 diastolic dysfunction, mild MR, normal RV function, severe TR.  Since last clinic visit, she reports that she has been having chest pain.  States that she notices chest pain when she is walking fast.  Describes it as tightness in her chest.  Will resolve when she slows down.  Also has been having shortness of breath with exertion.  Reports that 3 months ago had a syncopal episode.  States that she was cooking, suddenly felt lightheaded and that the room was spinning and when she woke up she was on the ground.  Does report she had not been feeling well that day and had not eaten very much.  However she denies any chest pain, dyspnea, or palpitations before she passed out.  No episodes since that time.  Does report feeling dizzy when she bends down.  Has not had any palpitations recently.  Denies any lower extremity edema.  Past Medical History:  Diagnosis Date   Acute respiratory failure (HCC)    CAP (community acquired pneumonia)    Hyperlipidemia    Hypertension     Metabolic syndrome     Past Surgical History:  Procedure Laterality Date   CESAREAN SECTION     COLONOSCOPY N/A 06/07/2019   Procedure: COLONOSCOPY;  Surgeon: West Bali, MD;  [facility];  Service: Endoscopy;  Laterality: N/A;  12:45   POLYPECTOMY  06/07/2019   Procedure: POLYPECTOMY;  Surgeon: West Bali, MD;  Location: [facility];  Service: Endoscopy;;    Current Medications: Current Meds  Medication Sig   acetaminophen (TYLENOL) 500 MG tablet Take 1 tablet (500 mg total) by mouth every 6 (six) hours as needed.   aspirin EC 81 MG tablet Take 1 tablet (81 mg total) by mouth daily.   atorvastatin (LIPITOR) 20 MG tablet Take 1 tablet (20 mg total) by mouth daily at 6 PM.   budesonide (PULMICORT) 180 MCG/ACT inhaler Inhale 2 puffs into the lungs in the morning and at bedtime.   Carboxymethylcellul-Glycerin (LUBRICATING EYE DROPS OP) Place 1 drop into both eyes daily as needed (dry eyes).   cetirizine (ZYRTEC) 10 MG tablet Take 1 tablet (10 mg total) by mouth daily as needed for allergies.   diltiazem (CARDIZEM) 60 MG tablet Take 2 hours before cardiac ct   hydrochlorothiazide (HYDRODIURIL) 25 MG tablet Take 1 tablet (25 mg total) by mouth daily.   ibuprofen (ADVIL) 200 MG tablet Take 400 mg by mouth every 6 (six) hours as needed for headache or moderate pain.  Ipratropium-Albuterol (COMBIVENT RESPIMAT) 20-100 MCG/ACT AERS respimat Inhale 1 puff into the lungs every 6 (six) hours as needed for wheezing.   levothyroxine (SYNTHROID) 50 MCG tablet Take 1 tablet (50 mcg total) by mouth daily before breakfast.   losartan (COZAAR) 50 MG tablet Take 1 tablet (50 mg total) by mouth daily.   Omega 3 1000 MG CAPS Take 1,000 mg by mouth daily.    pantoprazole (PROTONIX) 40 MG tablet Take 1 tablet (40 mg total) by mouth daily.   potassium chloride (KLOR-CON) 10 MEQ tablet Take 1 tablet (10 mEq total) by mouth daily.   [DISCONTINUED] metoprolol tartrate (LOPRESSOR) 100 MG  tablet Take 100 mg 2 hours before cardiac ct     Allergies:   Hydromet [hydrocodone bit-homatrop mbr], Toprol xl [metoprolol], and Amoxicillin   Social History   Socioeconomic History   Marital status: Married    Spouse name: Ramiro   Number of children: 3   Years of education: 6   Highest education level: 6th grade  Occupational History   Occupation: retired  Tobacco Use   Smoking status: Former   Smokeless tobacco: Never  Building services engineer Use: Never used  Substance and Sexual Activity   Alcohol use: No   Drug use: No   Sexual activity: Not Currently    Birth control/protection: Post-menopausal  Other Topics Concern   Not on file  Social History Narrative   Lives home with her husband - active in church - children live nearby   Social Determinants of Health   Financial Resource Strain: Low Risk    Difficulty of Paying Living Expenses: Not hard at all  Food Insecurity: No Food Insecurity   Worried About Programme researcher, broadcasting/film/video in the Last Year: Never true   Barista in the Last Year: Never true  Transportation Needs: No Transportation Needs   Lack of Transportation (Medical): No   Lack of Transportation (Non-Medical): No  Physical Activity: Insufficiently Active   Days of Exercise per Week: 3 days   Minutes of Exercise per Session: 30 min  Stress: No Stress Concern Present   Feeling of Stress : Not at all  Social Connections: Socially Integrated   Frequency of Communication with Friends and Family: More than three times a week   Frequency of Social Gatherings with Friends and Family: More than three times a week   Attends Religious Services: More than 4 times per year   Active Member of [employer] or Organizations: Yes   Attends Engineer, structural: More than 4 times per year   Marital Status: Married     Family History:  The patient's family history includes Breast cancer in her niece; Cancer in her father; Healthy in her brother, daughter, sister,  son, and son; Memory loss in her sister.   ROS:   Please see the history of present illness.    ROS All other systems reviewed and are negative.   PHYSICAL EXAM:   VS:  BP (!) 144/80   Pulse 73   Wt 145 lb 9.6 oz (66 kg)   SpO2 98%   BMI 24.23 kg/m   Physical Exam  GEN: Well nourished, well developed, in no acute distress  Neck: no JVD, carotid bruits Cardiac:RRR; no murmurs, rubs, or gallops  Respiratory:  clear to auscultation bilaterally, normal work of breathing GI: soft, nontender Ext: without cyanosis, clubbing, or edema Neuro:  Alert and Oriented x 3 Psych: euthymic mood, full affect  Wt Readings  from Last 3 Encounters:  01/31/21 145 lb 9.6 oz (66 kg)  10/26/20 145 lb 6.4 oz (66 kg)  09/22/20 146 lb (66.2 kg)      Studies/Labs Reviewed:   EKG:  EKG shows normal sinus rhythm, rate 73, LVH less than 1 mm ST depressions in leads I, 2II aVL Recent Labs: 03/23/2020: TSH 1.130 09/06/2020: ALT 56; BUN 20; Creatinine, Ser 0.73; Hemoglobin 13.1; Platelets 161; Potassium 3.3; Sodium 136   Lipid Panel    Component Value Date/Time   CHOL 135 03/23/2020 1042   CHOL 180 09/23/2012 1335   TRIG 105 03/23/2020 1042   TRIG 108 08/10/2014 0852   TRIG 135 09/23/2012 1335   HDL 45 03/23/2020 1042   HDL 40 08/10/2014 0852   HDL 37 (L) 09/23/2012 1335   CHOLHDL 3.0 03/23/2020 1042   LDLCALC 71 03/23/2020 1042   LDLCALC 118 (H) 05/04/2013 1528   LDLCALC 116 (H) 09/23/2012 1335   LDLDIRECT 77 07/11/2016 1536    Additional studies/ records that were reviewed today include:  Monitor 08/23/19  . [employer] monitor resulted, 8 days 8 hours 21 minutes analyzed. Predominant rhythm is sinus with heart rate ranging from 53 bpm up to 137 bpm and average heart rate 78 bpm. Rare PACs were noted representing less than 1% of total beats. Very rare PVCs noted. Brief bursts of SVT noted, the longest of which was 7 beats. There were no sustained arrhythmias or pauses. No definite atrial  fibrillation. Patient triggered events did not clearly correlate with arrhythmia.    Echo 12/18/17  Study Conclusions   - Left ventricle: The cavity size was normal. Systolic function was    vigorous. The estimated ejection fraction was in the range of 65%    to 70%. Wall motion was normal; there were no regional wall    motion abnormalities. Doppler parameters are consistent with    abnormal left ventricular relaxation (grade 1 diastolic    dysfunction). There was no evidence of elevated ventricular    filling pressure by Doppler parameters.  - Aortic valve: Valve area (VTI): 1.43 cm^2. Valve area (Vmax):    1.67 cm^2. Valve area (Vmean): 1.63 cm^2.  - Aortic root: The aortic root was normal in size.  - Mitral valve: There was mild regurgitation.  - Left atrium: The atrium was normal in size.  - Right ventricle: Systolic function was normal.  - Tricuspid valve: There was severe regurgitation.  - Pulmonary arteries: Systolic pressure was moderately increased.    PA peak pressure: 54 mm Hg (S).  - Inferior vena cava: The vessel was normal in size.  - Pericardium, extracardiac: There was no pericardial effusion.    Monitor with some skipping and faster HR but no a fib and has resolved with dilt.   08/23/19   Nuc study 03/15/18 no ischemia    CT chest high resolution 12/30/19 There is aortic atherosclerosis, as well as atherosclerosis of the great vessels of the mediastinum and the coronary arteries, including calcified atherosclerotic plaque in the left main, left anterior descending and right coronary arteries.   The appearance of the lungs is compatible with interstitial lung disease, with mild progression of changes compared to the prior study, with a spectrum of findings considered probable usual interstitial pneumonia (UIP) per current ATS guidelines.     Risk Assessment/Calculations:    CHA2DS2-VASc Score = 4  This indicates a 4.8% annual risk of stroke. The patient's  score is based upon: CHF History: 0 HTN History:  1 Diabetes History: 0 Stroke History: 0 Vascular Disease History: 1 Age Score: 1 Gender Score: 1        ASSESSMENT:    1. Syncope and collapse   2. Chest pain, unspecified type   3. Angina pectoris (HCC)   4. Valvular heart disease   5. Paroxysmal atrial fibrillation (HCC)      PLAN:   Chest pain: Symptoms concerning for typical angina, as describes chest tightness when walking fast, resolves with slowing down.  Had coronary calcifications on CT with normal nuclear study in 2019. -Recommend coronary CTA for further evaluation.  Has allergy listed for metoprolol, will give diltiazem prior to study  Syncope: Check Zio patch x2 weeks to evaluate for arrhythmia.  Check echocardiogram to evaluate for structural heart disease.  PAF: occurrence when hospitalized with pulmonary issues no recurrence on follow-up [employer]ice monitor x 14 days 08/2019.  Has been maintained on aspirin, allergy to metoprolol but now maintained on diltiazem.  Hypertension on hydrochlorothiazide 25 mg daily, losartan 50 mg daily  Valvular heart disease with mild mitral and severe tricuspid regurgitation with moderately elevated pulmonary pressures likely secondary to interstitial lung disease -Repeat echo  Hyperlipidemia on atorvastatin  RTC in 3 months  Shared Decision Making/Informed Consent        Medication Adjustments/Labs and Tests Ordered: Current medicines are reviewed at length with the patient today.  Concerns regarding medicines are outlined above.  Medication changes, Labs and Tests ordered today are listed in the Patient Instructions below. Patient Instructions  Medication Instructions:   Take Diltiazem 60 mg 2 hours before cardiac CT   *If you need a refill on your cardiac medications before your next appointment, please call your pharmacy*   Lab Work:  BMET  If you have labs (blood work) drawn today and your tests are  completely normal, you will receive your results only by: MyChart Message (if you have MyChart) OR A paper copy in the mail If you have any lab test that is abnormal or we need to change your treatment, we will call you to review the results.   Testing/Procedures: Your physician has requested that you have an echocardiogram. Echocardiography is a painless test that uses sound waves to create images of your heart. It provides your doctor with information about the size and shape of your heart and how well your heart's chambers and valves are working. This procedure takes approximately one hour. There are no restrictions for this procedure.   Your physician has requested that you have cardiac CT. Cardiac computed tomography (CT) is a painless test that uses an x-ray machine to take clear, detailed pictures of your heart. For further information please visit [email or web address] Please follow instruction sheet as given.     ZIO XT- Long Term Monitor Instructions   Your physician has requested you wear your ZIO patch monitor_____14__days.   This is a single patch monitor.  Irhythm supplies one patch monitor per enrollment.  Additional stickers are not available.   Please do not apply patch if you will be having a Nuclear Stress Test, Echocardiogram, Cardiac CT, MRI, or Chest Xray during the time frame you would be wearing the monitor. The patch cannot be worn during these tests.  You cannot remove and re-apply the ZIO XT patch monitor.  upward.       Do not shower for the first 24 hours.  You may shower after the first 24 hours.   Press button if you feel a symptom. You will  hear a small click.  Record Date, Time and Symptom in the Patient Log Book.   When you are ready to remove patch, follow instructions on last 2 pages of Patient Log Book.  Stick patch monitor onto last page of Patient Log Book.   Place Patient Log Book in [place] box.  Use locking tab on box and tape box closed securely.   The Orange and [employer] has [employer] on it.  Please place in mailbox as soon as possible.  Your physician should have your test results approximately 7 days after the monitor has been mailed back to [facility].   Call [facility] Customer Care at [phone] if you have questions regarding your ZIO XT patch monitor.  Call them immediately if you see an orange light blinking on your monitor.   If your monitor falls off in less than 4 days contact our Monitor department at [phone].  If your monitor becomes loose or falls off after 4 days call [employer] at [phone] for suggestions on securing your monitor.       Follow-Up: At [facility], you and your health needs are our priority.  As part of our continuing mission to provide you with exceptional heart care, we have created designated Provider Care Teams.  These Care Teams include your primary Cardiologist (physician) and Advanced Practice Providers (APPs -  Physician Assistants and Nurse Practitioners) who all work together to provide you with the care you need, when you need it.  We recommend signing up for the patient portal called "MyChart".  Sign up information is provided on this After Visit Summary.  MyChart is used to connect with patients for Virtual Visits (Telemedicine).  Patients are able to view lab/test results, encounter notes, upcoming appointments, etc.  Non-urgent messages can be sent to your provider as well.   To learn more about what you can do with MyChart, go to [email or web address].    Your next appointment:   3 month(s)  The format for your next appointment:   In Person  Provider:   Dr.Nature Kueker    Other Instructions None    Signed, Little Ishikawa, MD  01/31/2021 1:22 PM

## 2021-02-01 ENCOUNTER — Other Ambulatory Visit: Payer: Self-pay | Admitting: *Deleted

## 2021-02-01 DIAGNOSIS — E876 Hypokalemia: Secondary | ICD-10-CM

## 2021-02-01 MED ORDER — POTASSIUM CHLORIDE CRYS ER 20 MEQ PO TBCR
20.0000 meq | EXTENDED_RELEASE_TABLET | Freq: Every day | ORAL | 3 refills | Status: DC
Start: 2021-02-01 — End: 2022-02-25

## 2021-02-01 NOTE — Addendum Note (Signed)
Addended by: Berlinda Last on: 02/01/2021 07:23 AM   Modules accepted: Orders

## 2021-02-14 DIAGNOSIS — R55 Syncope and collapse: Secondary | ICD-10-CM

## 2021-02-19 ENCOUNTER — Other Ambulatory Visit: Payer: Self-pay

## 2021-02-19 ENCOUNTER — Other Ambulatory Visit: Payer: Medicare Other

## 2021-02-19 DIAGNOSIS — E876 Hypokalemia: Secondary | ICD-10-CM | POA: Diagnosis not present

## 2021-02-20 DIAGNOSIS — R55 Syncope and collapse: Secondary | ICD-10-CM | POA: Diagnosis not present

## 2021-02-20 LAB — BASIC METABOLIC PANEL
BUN/Creatinine Ratio: 23 (ref 12–28)
BUN: 17 mg/dL (ref 8–27)
CO2: 24 mmol/L (ref 20–29)
Calcium: 9.1 mg/dL (ref 8.7–10.3)
Chloride: 105 mmol/L (ref 96–106)
Creatinine, Ser: 0.74 mg/dL (ref 0.57–1.00)
Glucose: 98 mg/dL (ref 70–99)
Potassium: 3.7 mmol/L (ref 3.5–5.2)
Sodium: 141 mmol/L (ref 134–144)
eGFR: 87 mL/min/{1.73_m2} (ref >59)

## 2021-02-26 ENCOUNTER — Encounter: Payer: Self-pay | Admitting: *Deleted

## 2021-03-05 ENCOUNTER — Other Ambulatory Visit: Payer: Self-pay | Admitting: Physician Assistant

## 2021-03-07 DIAGNOSIS — Z20822 Contact with and (suspected) exposure to covid-19: Secondary | ICD-10-CM | POA: Diagnosis not present

## 2021-03-08 ENCOUNTER — Other Ambulatory Visit: Payer: Self-pay

## 2021-03-08 ENCOUNTER — Ambulatory Visit (HOSPITAL_COMMUNITY)
Admission: RE | Admit: 2021-03-08 | Discharge: 2021-03-08 | Disposition: A | Discharge status: Home / Self Care | Payer: Medicare Other | Source: Ambulatory Visit | Attending: Cardiology | Admitting: Cardiology

## 2021-03-08 DIAGNOSIS — R55 Syncope and collapse: Secondary | ICD-10-CM | POA: Diagnosis not present

## 2021-03-08 LAB — ECHOCARDIOGRAM COMPLETE
Area-P 1/2: 3.12 cm2
S' Lateral: 2.1 cm

## 2021-03-08 NOTE — Progress Notes (Signed)
*  PRELIMINARY RESULTS* Echocardiogram 2D Echocardiogram has been performed.  Tasha [name] 03/08/2021, 10:19 AM

## 2021-03-14 ENCOUNTER — Other Ambulatory Visit (HOSPITAL_COMMUNITY): Payer: Medicare Other

## 2021-03-20 ENCOUNTER — Encounter: Payer: Self-pay | Admitting: *Deleted

## 2021-03-23 ENCOUNTER — Ambulatory Visit (INDEPENDENT_AMBULATORY_CARE_PROVIDER_SITE_OTHER): Payer: Medicare Other | Admitting: Family Medicine

## 2021-03-23 ENCOUNTER — Encounter: Payer: Self-pay | Admitting: Family Medicine

## 2021-03-23 VITALS — BP 133/72 | HR 96 | Ht 65.0 in | Wt 144.0 lb

## 2021-03-23 DIAGNOSIS — E039 Hypothyroidism, unspecified: Secondary | ICD-10-CM

## 2021-03-23 DIAGNOSIS — I10 Essential (primary) hypertension: Secondary | ICD-10-CM

## 2021-03-23 DIAGNOSIS — R7303 Prediabetes: Secondary | ICD-10-CM | POA: Diagnosis not present

## 2021-03-23 DIAGNOSIS — I209 Angina pectoris, unspecified: Secondary | ICD-10-CM

## 2021-03-23 DIAGNOSIS — E785 Hyperlipidemia, unspecified: Secondary | ICD-10-CM

## 2021-03-23 DIAGNOSIS — K219 Gastro-esophageal reflux disease without esophagitis: Secondary | ICD-10-CM

## 2021-03-23 LAB — BAYER DCA HB A1C WAIVED: HB A1C (BAYER DCA - WAIVED): 5.1 % (ref 4.8–5.6)

## 2021-03-23 MED ORDER — PANTOPRAZOLE SODIUM 40 MG PO TBEC: 40.0000 mg | DELAYED_RELEASE_TABLET | Freq: Every day | ORAL | 3 refills | Status: DC

## 2021-03-23 NOTE — Progress Notes (Signed)
BP 133/72   Pulse 96   Ht 5\' 5"  (1.651 m)   Wt 144 lb (65.3 kg)   SpO2 93%   BMI 23.96 kg/m    Subjective:   [name], female    DOB: 28-Nov-1950, 70 y.o.   MRN: [id number]  HPI: Tasha [name] is a 70 y.o. female presenting on 03/23/2021 for Medical Management of Chronic Issues, Hypertension, and Prediabetes   HPI Prediabetes  patient comes in today for recheck of his diabetes. Patient has been currently taking no medication. Patient is currently on an ACE inhibitor/ARB. Patient has seen an ophthalmologist this year. Patient denies any issues with their feet. The symptom started onset as an adult hypertension and hyperlipidemia ARE RELATED TO DM   Hypertension Patient is currently on losartan, and their blood pressure today is 133/72. Patient denies any lightheadedness or dizziness. Patient denies headaches, blurred vision, chest pains, shortness of breath, or weakness. Denies any side effects from medication and is content with current medication.   Hyperlipidemia Patient is coming in for recheck of his hyperlipidemia. The patient is currently taking atorvastatin and fish oil. They deny any issues with myalgias or history of liver damage from it. They deny any focal numbness or weakness or chest pain.   GERD Patient is currently on carvedilol.  She denies any major symptoms or abdominal pain or belching or burping. She denies any blood in her stool or lightheadedness or dizziness.   Relevant past medical, surgical, family and social history reviewed and updated as indicated. Interim medical history since our last visit reviewed. Allergies and medications reviewed and updated.  Review of Systems  Constitutional:  Negative for chills and fever.  HENT:  Negative for congestion, ear discharge and ear pain.   Eyes:  Negative for redness and visual disturbance.  Respiratory:  Negative for chest tightness and shortness of breath.   Cardiovascular:  Negative for  chest pain and leg swelling.  Genitourinary:  Negative for difficulty urinating and dysuria.  Musculoskeletal:  Negative for back pain and gait problem.  Skin:  Negative for rash.  Neurological:  Negative for light-headedness and headaches.  Psychiatric/Behavioral:  Negative for agitation and behavioral problems.   All other systems reviewed and are negative.  Per HPI unless specifically indicated above   Allergies as of 03/23/2021       Reactions   Hydromet [hydrocodone Bit-homatrop Mbr]    Pt doesn't remember   Toprol Xl [metoprolol] Hives   Facial rash, itching, hives   Amoxicillin Rash   Did it involve swelling of the face/tongue/throat, SOB, or low BP? No Did it involve sudden or severe rash/hives, skin peeling, or any reaction on the inside of your mouth or nose? Unknown Did you need to seek medical attention at a hospital or doctor's office? Unknown When did it last happen?      10 + years If all above answers are "NO", may proceed with cephalosporin use.        Medication List        Accurate as of March 23, 2021  9:42 AM. If you have any questions, ask your nurse or doctor.          STOP taking these medications    diltiazem 120 MG 24 hr capsule Commonly known as: CARDIZEM CD Stopped by: [name] Alahni Varone, MD   diltiazem 60 MG tablet Commonly known as: Cardizem Stopped by: Nils Pyle, MD   LUBRICATING EYE DROPS OP Stopped by: Ivin Booty  A Khushbu Pippen, MD       TAKE these medications    acetaminophen 500 MG tablet Commonly known as: TYLENOL Take 1 tablet (500 mg total) by mouth every 6 (six) hours as needed.   aspirin EC 81 MG tablet Take 1 tablet (81 mg total) by mouth daily.   atorvastatin 20 MG tablet Commonly known as: LIPITOR Take 1 tablet (20 mg total) by mouth daily at 6 PM.   budesonide 180 MCG/ACT inhaler Commonly known as: PULMICORT Inhale 2 puffs into the lungs in the morning and at bedtime.   cetirizine 10 MG  tablet Commonly known as: ZYRTEC Take 1 tablet (10 mg total) by mouth daily as needed for allergies.   Combivent Respimat 20-100 MCG/ACT Aers respimat Generic drug: Ipratropium-Albuterol Inhale 1 puff into the lungs every 6 (six) hours as needed for wheezing.   hydrochlorothiazide 25 MG tablet Commonly known as: HYDRODIURIL Take 1 tablet (25 mg total) by mouth daily.   ibuprofen 200 MG tablet Commonly known as: ADVIL Take 400 mg by mouth every 6 (six) hours as needed for headache or moderate pain.   levothyroxine 50 MCG tablet Commonly known as: SYNTHROID Take 1 tablet (50 mcg total) by mouth daily before breakfast.   losartan 50 MG tablet Commonly known as: COZAAR Take 1 tablet (50 mg total) by mouth daily.   Omega 3 1000 MG Caps Take 1,000 mg by mouth daily.   pantoprazole 40 MG tablet Commonly known as: PROTONIX Take 1 tablet (40 mg total) by mouth daily.   potassium chloride SA 20 MEQ tablet Commonly known as: KLOR-CON M Take 1 tablet (20 mEq total) by mouth daily.         Objective:   BP 133/72   Pulse 96   Ht 5\' 5"  (1.651 m)   Wt 144 lb (65.3 kg)   SpO2 93%   BMI 23.96 kg/m   Wt Readings from Last 3 Encounters:  03/23/21 144 lb (65.3 kg)  01/31/21 145 lb 9.6 oz (66 kg)  10/26/20 145 lb 6.4 oz (66 kg)    Physical Exam Vitals and nursing note reviewed.  Constitutional:      General: She is not in acute distress.    Appearance: She is well-developed. She is not diaphoretic.  Eyes:     Conjunctiva/sclera: Conjunctivae normal.  Cardiovascular:     Rate and Rhythm: Normal rate and regular rhythm.     Heart sounds: Normal heart sounds. No murmur heard. Pulmonary:     Effort: Pulmonary effort is normal. No respiratory distress.     Breath sounds: Normal breath sounds. No wheezing.  Musculoskeletal:        General: No swelling or tenderness. Normal range of motion.  Skin:    General: Skin is warm and dry.     Findings: No rash.  Neurological:      Mental Status: She is alert and oriented to person, place, and time.     Coordination: Coordination normal.  Psychiatric:        Behavior: Behavior normal.      Assessment & Plan:   Problem List Items Addressed This Visit       Cardiovascular and Mediastinum   Hypertension   Relevant Orders   CMP14+EGFR     Digestive   Gastroesophageal reflux disease without esophagitis   Relevant Medications   pantoprazole (PROTONIX) 40 MG tablet   Other Relevant Orders   CBC with Differential/Platelet     Endocrine   Hypothyroidism -  Primary   Relevant Orders   TSH     Other   Hyperlipidemia with target LDL less than 100   Relevant Orders   Lipid panel   Prediabetes   Relevant Orders   Bayer DCA Hb A1c Waived    Blood work and echocardiogram that were done most recently look good.  We will check blood work here in the office.  Follow up plan: Return in about 3 months (around 06/21/2021), or if symptoms worsen or fail to improve, for Thyroid and prediabetes.  Counseling provided for all of the vaccine components Orders Placed This Encounter  Procedures   CBC with Differential/Platelet   Lipid panel   Bayer DCA Hb A1c Waived   CMP14+EGFR   TSH    Arville Care, MD [facility] 03/23/2021, 9:42 AM

## 2021-03-24 LAB — TSH: TSH: 1.57 u[IU]/mL (ref 0.450–4.500)

## 2021-03-24 LAB — CBC WITH DIFFERENTIAL/PLATELET
Basophils Absolute: 0 10*3/uL (ref 0.0–0.2)
Basos: 0 %
EOS (ABSOLUTE): 0.2 10*3/uL (ref 0.0–0.4)
Eos: 3 %
Hematocrit: 41.7 % (ref 34.0–46.6)
Hemoglobin: 13.9 g/dL (ref 11.1–15.9)
Immature Grans (Abs): 0 10*3/uL (ref 0.0–0.1)
Immature Granulocytes: 0 %
Lymphocytes Absolute: 1.4 10*3/uL (ref 0.7–3.1)
Lymphs: 17 %
MCH: 30.5 pg (ref 26.6–33.0)
MCHC: 33.3 g/dL (ref 31.5–35.7)
MCV: 91 fL (ref 79–97)
Monocytes Absolute: 1.2 10*3/uL — ABNORMAL HIGH (ref 0.1–0.9)
Monocytes: 14 %
Neutrophils Absolute: 5.7 10*3/uL (ref 1.4–7.0)
Neutrophils: 66 %
Platelets: 169 10*3/uL (ref 150–450)
RBC: 4.56 x10E6/uL (ref 3.77–5.28)
RDW: 13.9 % (ref 11.7–15.4)
WBC: 8.6 10*3/uL (ref 3.4–10.8)

## 2021-03-24 LAB — CMP14+EGFR
ALT: 97 IU/L — ABNORMAL HIGH (ref 0–32)
AST: 75 IU/L — ABNORMAL HIGH (ref 0–40)
Albumin/Globulin Ratio: 1.7 (ref 1.2–2.2)
Albumin: 4.2 g/dL (ref 3.8–4.8)
Alkaline Phosphatase: 68 IU/L (ref 44–121)
BUN/Creatinine Ratio: 17 (ref 12–28)
BUN: 15 mg/dL (ref 8–27)
Bilirubin Total: 0.3 mg/dL (ref 0.0–1.2)
CO2: 25 mmol/L (ref 20–29)
Calcium: 9.5 mg/dL (ref 8.7–10.3)
Chloride: 104 mmol/L (ref 96–106)
Creatinine, Ser: 0.86 mg/dL (ref 0.57–1.00)
Globulin, Total: 2.5 g/dL (ref 1.5–4.5)
Glucose: 95 mg/dL (ref 70–99)
Potassium: 3.9 mmol/L (ref 3.5–5.2)
Sodium: 143 mmol/L (ref 134–144)
Total Protein: 6.7 g/dL (ref 6.0–8.5)
eGFR: 73 mL/min/{1.73_m2} (ref >59)

## 2021-03-24 LAB — LIPID PANEL
Chol/HDL Ratio: 3 ratio (ref 0.0–4.4)
Cholesterol, Total: 125 mg/dL (ref 100–199)
HDL: 41 mg/dL (ref >39)
LDL Chol Calc (NIH): 66 mg/dL (ref 0–99)
Triglycerides: 94 mg/dL (ref 0–149)
VLDL Cholesterol Cal: 18 mg/dL (ref 5–40)

## 2021-04-04 ENCOUNTER — Telehealth (HOSPITAL_COMMUNITY): Payer: Self-pay | Admitting: *Deleted

## 2021-04-04 NOTE — Telephone Encounter (Signed)
Reaching out to patient to offer assistance regarding upcoming cardiac imaging study; pt's daughter verbalizes understanding of appt date/time, parking situation and where to check in, pre-test NPO status and medications ordered, and verified current allergies; name and call back number provided for further questions should they arise  Gordy Clement RN Navigator Cardiac Imaging [facility] [phone] office [phone] cell  Patient to take 60mg  cardizem two hours prior to cardiac CT. Patient's daughter Verdis Frederickson) is aware that patient is to arrive at 11:30am for her noon scan.

## 2021-04-05 ENCOUNTER — Ambulatory Visit (HOSPITAL_COMMUNITY)
Admission: RE | Admit: 2021-04-05 | Discharge: 2021-04-05 | Disposition: A | Discharge status: Home / Self Care | Payer: Medicare Other | Source: Ambulatory Visit | Attending: Cardiology | Admitting: Cardiology

## 2021-04-05 ENCOUNTER — Other Ambulatory Visit: Payer: Self-pay

## 2021-04-05 DIAGNOSIS — I209 Angina pectoris, unspecified: Secondary | ICD-10-CM | POA: Diagnosis not present

## 2021-04-05 DIAGNOSIS — I700 Atherosclerosis of aorta: Secondary | ICD-10-CM | POA: Diagnosis not present

## 2021-04-05 MED ORDER — DILTIAZEM HCL 25 MG/5ML IV SOLN
INTRAVENOUS | Status: AC
  Administered 2021-04-05: 10 mg via INTRAVENOUS
  Filled 2021-04-05: qty 5

## 2021-04-05 MED ORDER — DILTIAZEM HCL 25 MG/5ML IV SOLN
10.0000 mg | Freq: Once | INTRAVENOUS | Status: AC
  Administered 2021-04-05: 10 mg via INTRAVENOUS

## 2021-04-05 MED ORDER — IOHEXOL 350 MG/ML SOLN
95.0000 mL | Freq: Once | INTRAVENOUS | Status: AC | PRN
  Administered 2021-04-05: 95 mL via INTRAVENOUS

## 2021-04-05 MED ORDER — NITROGLYCERIN 0.4 MG SL SUBL
SUBLINGUAL_TABLET | SUBLINGUAL | Status: AC
  Administered 2021-04-05: 0.8 mg
  Filled 2021-04-05: qty 2

## 2021-04-06 ENCOUNTER — Encounter: Payer: Self-pay | Admitting: *Deleted

## 2021-04-06 ENCOUNTER — Telehealth: Payer: Self-pay | Admitting: Internal Medicine

## 2021-04-06 NOTE — Telephone Encounter (Signed)
Spoke to [employer] with [employer].  Patient is requesting refill on Pulmicort.  Rx last prescribed 01/2020. Pulmicort is not listed in last OV.  MR, please advise if okay to refill. Thanks

## 2021-04-17 DIAGNOSIS — Z20822 Contact with and (suspected) exposure to covid-19: Secondary | ICD-10-CM | POA: Diagnosis not present

## 2021-04-18 NOTE — Telephone Encounter (Signed)
Ok to refill pulmicort.  Last seen July 2022. Ensure followup with spiro.dlco based on that visit

## 2021-04-19 ENCOUNTER — Other Ambulatory Visit: Payer: Self-pay | Admitting: Family Medicine

## 2021-04-19 DIAGNOSIS — Z139 Encounter for screening, unspecified: Secondary | ICD-10-CM

## 2021-04-19 MED ORDER — BUDESONIDE 180 MCG/ACT IN AEPB
2.0000 | INHALATION_SPRAY | Freq: Two times a day (BID) | RESPIRATORY_TRACT | 5 refills | Status: DC
Start: 2021-04-19 — End: 2023-01-01

## 2021-04-19 NOTE — Telephone Encounter (Signed)
Refill of pt's pulmicort has been sent to preferred pharmacy for pt. Pt is scheduled for spiro and dlco followed by OV with MR 04/27/21. Nothing further needed.

## 2021-04-27 ENCOUNTER — Ambulatory Visit (INDEPENDENT_AMBULATORY_CARE_PROVIDER_SITE_OTHER): Payer: Medicare Other | Admitting: Internal Medicine

## 2021-04-27 ENCOUNTER — Other Ambulatory Visit: Payer: Self-pay

## 2021-04-27 VITALS — BP 128/80 | HR 83 | Ht 61.0 in | Wt 143.0 lb

## 2021-04-27 DIAGNOSIS — J849 Interstitial pulmonary disease, unspecified: Secondary | ICD-10-CM | POA: Diagnosis not present

## 2021-04-27 NOTE — Progress Notes (Signed)
OV 01/22/18 71 year old female, never smoked. PMH  Hypertension, metabolic syndrome, thrombocytopenia, prediabetes, interstitial lung disease, PAF. Not previously followed by [facility] pulmonary care. Admitted to [facility] from outside hospital on 08/27 with acute hypoxic respiratory failure in setting of bilateral pulmonary infiltrates. Work up indicated [facility]. Dx subacute ILD vs pneumonia thought to be from exposure to antifreeze leak in patients car. Requiring intubation from 08/30-09/05, chest tube placed due to pneumothorax. [facility] from 09/10- 09/20, required min assistance with mobility and basic self-care tasks. Speech therapy worked on dysphagia, able to tolerate regular diet thin liquids without signs and symptoms of aspiration.    Patient presents today for hospital follow-up visit. Accompanied by her daughter who also provides translation. She is feeling a lot better, no breathing issues. Finished oral steroid today. BS 119. CBC improving. Eating and drinking ok. No difficulties swallowing and denies aspiration. Ambulting with walker. Denies fall, sob, wheezing, cough, fever or chest pain.    Results for Tasha [name], Tasha [name] (MRN [id number]) as of 02/19/2018 10:01  Ref. Range 12/16/2017 19:24 12/17/2017 18:59  Sed Rate Latest Ref Range: 0 - 22 mm/hr 76 (H) 69 (H)  Results for Tasha [name], Tasha [name] (MRN [id number]) as of 02/19/2018 10:01  Ref. Range 12/17/2017 18:59  Speckled Pattern Unknown 1:320 (H)  Results for Tasha [name], Tasha [name] (MRN [id number]) as of 02/19/2018 10:01  Ref. Range 12/19/2017 14:15  Color, Fluid Latest Ref Range: YELLOW  COLORLESS (A)  WBC, Fluid Latest Ref Range: 0 - 1,000 cu mm 93  Lymphs, Fluid Latest Units: % 78  Eos, Fluid Latest Units: % 2  Appearance, Fluid Latest Ref Range: CLEAR  CLEAR (A)  Neutrophil Count, Fluid Latest Ref Range: 0 - 25 % 7  Monocyte-Macrophage-Serous Fluid Latest Ref Range: 50 - 90 % 13 (L)     OV  02/19/2018  Subjective:  [name], female , DOB: 09-10-1950 , age 71 y.o. , MRN: [id number] , ADDRESS: [address] [place] [address]   02/19/2018 -   Chief Complaint  Patient presents with   Follow-up    States she still has dry cough, states the SOB has resolved. Denies chest pain or idscomfort.      HPI Tasha [name] 71 y.o. -accompanied by her daughter Verdis Frederickson.  End of August 2019 she suffered acute lung injury possibly Boop based on the nature of infiltrates and a high ESR and a positive ANA [BAL lymphocytosis].  There is some history of antifreeze exposure.  She has recovered from it and is now at home.  She is brought in by Dr. Verdis Frederickson.  Verdis Frederickson is doing the translation.  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  Post discharge in the hospital patient is off oxygen and is overall better.  Steroids ended 2 weeks ago but since the completion of steroids she feels the cough is coming back and getting worse.  Currently cough is moderate in intensity and without any sputum production.  2 days ago her primary care physician apparently changed 1 of her antihypertensives because of the cough.  I am unable to determine which one but I suspect it was an ACE inhibitor.  She is currently on losartan.  There is no fever or chills.  Current walking desaturation test appears adequate        ROS  OV 03/31/2018  Subjective:  [name], female , DOB: 09/10/1950 , age  71 y.o. , MRN: [id number] , ADDRESS: [place] [address]   03/31/2018 -   Chief Complaint  Patient presents with   Follow-up    pt states breathing is doing well. c/o non prod cough.     HPI Tasha [name] 71 y.o. -presents for interstitial lung disease acute with lymphocytic bronchoalveolar lavage after antifreeze exposure  After last visit she showed improvement.  We repeated ANA and this was negative.  We also did some extra  antibody such as anti-Jo 1 and this was negative.  Her ESR has normalized.  In the interim she continues to improve.  Shortness of breath is almost nonexistent.  Cough is improved but she still has mild residual cough.  She had high-resolution CT scan of the chest #2019 that I personally visualized and there is still some residual groundglass opacities along with some evolving fibrotic pattern.  It seems more predominant in the lung base.  Her her son Shellee Milo is with her.  History is gained from talking to interpreter on the video camera.  His name is Leanna Sato.  She denies any diabetes or hyperlipidemia or stroke in the past.  She does not have any mood swings.  In the past she has tolerated prednisone without problem.  Denies any bony issues.   OV 06/11/2018  Subjective:  [name], female , DOB: 1950/09/24 , age 71 y.o. , MRN: [id number] , ADDRESS: [address] [place] [address]  Tasha [name] 70 y.o. -presents for interstitial lung disease acute with lymphocytic bronchoalveolar lavage after antifreeze exposure 06/11/2018 -   Chief Complaint  Patient presents with   Follow-up    PFT performed today.  Pt states she has been doing well since last visit and denies any complaints.      HPI Tasha [name] 71 y.o. -follow-up.  At last visit in December 2019 I placed her on a 10-week prednisone course.  In between she came and saw a nurse practitioner approximately just over a month ago.  She was tolerating the prednisone fine.  It is now 4 weeks and she finished the prednisone.  According to the daughter who acted as the interpreter the prednisone really did help with her symptoms.  It improved her shortness of breath.  Currently she is nearly asymptomatic.  Her current symptoms are documented below.  She is feeling good.  She had pulmonary function test today with spirometry being normal and DLCO being near normal.  She is noted to be on ACE inhibitor but she denies any cough.  Last CT  scan of the chest was November 2019    OV 12/07/2019   Subjective:  [name], female , DOB: 1951-02-07, age 71 y.o. years. , MRN: [id number],  ADDRESS: [place] [address] PCP  Dettinger, Fransisca Kaufmann, MD Providers : Treatment Team:  Attending Provider: Brand Males, MD presents for interstitial lung disease acute with lymphocytic bronchoalveolar lavage after antifreeze exposure in aug 2019  Chief Complaint  Patient presents with   Follow-up    Last seen 06/11/2018. Pt states she has been about the same since last visit. Pt is still having complaints of cough and is coughing up occ white phlegm. Pt also has increased SOB when she is in enclosed areas that makes her feel like she cannot breathe.       HPI Tasha [name] 71 y.o. -presents with her daughter-in-law is for a face-to-face visit.  The interpreter is Mickel Baas.  Mickel Baas is  from [facility].  It appears that overall since her last visit in February 2020 patient is stable.  In August 2020 she had a CT scan of the chest that shows presence of mild ongoing ILD.  She tells [place] that she is had the Covid vaccine.  She has been exercising at the [facility].  She does not use oxygen.  She has mild shortness of breath with exertion when she bends down and some mild cough but overall compared to 2019 she is slowly progressively better.  She does have some dry cough when she is sitting as well.  She needs to drink water to improve that.  There are no other new medical issues.  Her walking desaturation test is unchanged compared to February 2020.   however when she did the interstitial lung disease symptom questionnaire symptoms are significantly worse than before.  She really cannot pinpoint if she is definitely worse or not.  She did admit that she has significant environmental allergies to pollen dust and perfumes.  This is a new history.   CT chest high resolution Aug 2020 COMPARISON:  03/11/2018, 12/17/2017    FINDINGS: Cardiovascular: Aortic atherosclerosis. Normal heart size. Scattered coronary artery calcifications. No pericardial effusion.   Mediastinum/Nodes: No enlarged mediastinal, hilar, or axillary lymph nodes. Thyroid gland, trachea, and esophagus demonstrate no significant findings.   Lungs/Pleura: There is a mild pattern of pulmonary fibrosis with an apical to basal gradient featuring mild tubular bronchiectasis, irregular peripheral interstitial opacity with some superimposed ground-glass, and some evidence of bronchiolectasis in the lung bases. There is no overt honeycombing. No significant air trapping on expiratory phase imaging. There is a densely calcified benign pulmonary nodule of the left lung base. No pleural effusion or pneumothorax.  IMPRESSION: 1. There is a mild pattern of pulmonary fibrosis with an apical to basal gradient featuring mild tubular bronchiectasis, irregular peripheral interstitial opacity with some superimposed ground-glass, and some evidence of bronchiolectasis in the lung bases. There is no overt honeycombing. No significant air trapping on expiratory phase imaging. Findings are not significantly changed compared to prior examination and consistent with an "indeterminate for UIP" pattern fibrosis by ATS pulmonary fibrosis criteria.   2.  Aortic atherosclerosis and coronary artery disease.     Electronically Signed   By: Eddie Candle M.D.   On: 12/10/2018 12:13  Results for Tasha [name], Tasha [name] (MRN [id number]) as of 06/11/2018 12:13  Ref. Range 06/11/2018 10:31  FVC-Pre Latest Units: L 2.05  FVC-%Pred-Pre Latest Units: % 74  FEV1-Pre Latest Units: L 1.88  FEV1-%Pred-Pre Latest Units: % 90  Pre FEV1/FVC ratio Latest Units: % 92   Results for JANESHIA, CILIBERTO (MRN [id number]) as of 06/11/2018 12:13  Ref. Range 06/11/2018 10:31  DLCO unc Latest Units: ml/min/mmHg 13.22  DLCO unc % pred Latest Units: % 74  Results for ANJANETTE, GILKEY (MRN  [id number]) as of 03/31/2018 16:37  Ref. Range 12/16/2017 19:24 12/17/2017 18:59 02/19/2018 10:53  Sed Rate Latest Ref Range: 0 - 30 mm/hr 76 (H) 69 (H) 18  Results for KATYANA, TROLINGER (MRN [id number]) as of 03/31/2018 16:37  Ref. Range 02/19/2018 10:53 02/19/2018 11:03  Anti Nuclear Antibody(ANA) Latest Ref Range: Negative   Negative  Anti JO-1 Latest Ref Range: 0.0 - 0.9 AI  <0.2  ds DNA Ab Latest Units: IU/mL <1   Results for Tasha [name], Tasha [name] (MRN [id number]) as of 01/25/2020 16:46  Ref. Range 11/25/2018 09:57 01/25/2019 09:31  Anti Nuclear Antibody (ANA) Latest Ref Range: NEGATIVE   POSITIVE (A)  ANA Pattern 1 Unknown  Nuclear, Nucleolar (A)  ANA Titer 1 Latest Units: titer Positive (A) 9:381 (H)  Cyclic Citrullin Peptide Ab Latest Units: UNITS 3 <16  ds DNA Ab Latest Units: IU/mL  <1  RA Latex Turbid. Latest Ref Range: <14 IU/mL 10.9 <14  ENA SM Ab Ser-aCnc Latest Ref Range: <1.0 NEG AI  <1.0 NEG  Homogeneous Pattern Unknown 1:160 (H)   Speckled Pattern Unknown 1:320 (H)       OV 01/25/2020   Subjective:  [name], female , DOB: [birth year], age 63 y.o. years. , MRN: [id number],  ADDRESS: [place] [id number] PCP  Dettinger, Fransisca Kaufmann, MD Providers : Treatment Team:  Attending Provider: Brand Males, MD   Chief Complaint  Patient presents with   Follow-up    cough decreased     August 2019 she suffered acute lung injury possibly Boop based on the nature of infiltrates and a high ESR and a positive ANA [BAL lymphocytosis].  There is some history of antifreeze exposure. -> ILD  Hx of env allergies - blood allergy test Aug 2021 - igE normal / but positive for Grass  Intermittent ANA positive - last +ve late 2020   HPI Tasha [name] 71 y.o. -presents with her daughter in law Early Chars and also the interpreter Vicente Males.  This visit has been scheduled because last visit she had increased symptoms.  Therefore we did a high-resolution CT chest Dr.  Weber Cooks the radiologist thinks the slight progression.  However we also repeated pulmonary function test improvement/stabilization.  In terms of symptoms patient tells me her symptoms are actually improved.  ILD symptom score actually shows improvement.  She attributes this to exercise.  We did walking desaturation test and it is stable.  Glucotrol documented below.  He understands that she has pulmonary fibrosis.  In fact she says that she looked at [employer] and got extremely worried about pulmonary fibrosis.  At this point in time she denies any symptoms of connective tissue disease.  Overall she is stable.  She continues to have cough.  We did blood allergy work-up and is positive for grass mold IgE is normal.  She was supposed to exam nitric oxide test but this has not been done.  She has had a Covid vaccine and flu shot.  She had to have a booster counseled her about that.   No results found for: NITRICOXIDE     IMPRESSION: 1. The appearance of the lungs is compatible with interstitial lung disease, with mild progression of changes compared to the prior study, with a spectrum of findings considered probable usual interstitial pneumonia (UIP) per current ATS guidelines. 2. Aortic atherosclerosis, in addition to left main and 2 vessel coronary artery disease. Please note that although the presence of coronary artery calcium documents the presence of coronary artery disease, the severity of this disease and any potential stenosis cannot be assessed on this non-gated CT examination. Assessment for potential risk factor modification, dietary therapy or pharmacologic therapy may be warranted, if clinically indicated.   Aortic Atherosclerosis (ICD10-I70.0).     Electronically Signed   By: Vinnie Langton M.D.   On: 12/31/2019 08:22 OV 10/26/2020  Subjective:  [name], female , DOB: 20-Nov-1950 , age 19 y.o. , MRN: [id number] , ADDRESS: [address] [place]  [id number] PCP Dettinger, Fransisca Kaufmann, MD Patient Care Team: Dettinger, Fransisca Kaufmann, MD as PCP - General (Family Medicine) Herminio Commons, MD (Inactive) as PCP -  Cardiology (Cardiology) Gala Romney Cristopher Estimable, MD as Consulting Physician (Gastroenterology)  This Provider for this visit: Treatment Team:  Attending Provider: Brand Males, MD    10/26/2020 -   Chief Complaint  Patient presents with   Follow-up    Pt states she is feeling better since last visit. States that she does have some SOB but after using her inhaler twice a day she feels fine.    August 2019 she suffered acute lung injury possibly Boop based on the nature of infiltrates and a high ESR and a positive ANA [BAL lymphocytosis].  There is some history of antifreeze exposure. -> ILD  Hx of env allergies - blood allergy test Aug 2021 - igE normal / but positive for Grass  Intermittent ANA positive - last +ve late 2020   HPI Tasha [name] 71 y.o. -returns for follow-up with her daughter-in-law Early Chars.  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.  She feels the Pulmicort is helping her.  In fact symptoms improved after Pulmicort.  There are no interim health issues.  Symptom score is stable.  Walking desaturation test is stable.  Previously we checked ANA and is again positive the entire ANA profile as listed.  On exam she still has bilateral bibasal crackles      OV 04/27/2021  Subjective:  [name], female , DOB: 04/24/1950 , age 74 y.o. , MRN: [id number] , ADDRESS: [address] [place] [address] PCP Dettinger, Fransisca Kaufmann, MD Patient Care Team: Dettinger, Fransisca Kaufmann, MD as PCP - General (Family Medicine) Donato Heinz, MD as PCP - Cardiology (Cardiology) Gala Romney Cristopher Estimable, MD as Consulting Physician (Gastroenterology)  This Provider for this visit: Treatment Team:  Attending Provider: Brand Males, MD    04/27/2021 -   Chief Complaint  Patient presents with   Follow-up     PFT performed today.  Pt states she is about the same since last visit.    August 2019 she suffered acute lung injury possibly Boop based on the nature of infiltrates and a high ESR and a positive ANA [BAL lymphocytosis].  There is some history of antifreeze exposure. -> ILD  Hx of env allergies - blood allergy test Aug 2021 - igE normal / but positive for Grass  Intermittent ANA positive - last +ve late 2020  HPI Tasha [name] 71 y.o. -returns for follow-up.  Her daughter acts as the interpreter.  Overall she feels stable.  This can be evidence in the ILD symptom score.  Also walking desaturation test is stable.  She is interested in pulmonary rehabilitation to improve her shortness of breath that is still there somewhat.  Her pulmonary function test shows and suggest decline in 3 years.  She is not feeling this.  Last CT scan of the chest was in September 2021.  Explained the need for repeat CT scan and pulmonary function test at a closer than 33-monthinterval and she is willing.  Explained that if there is evidence of progression we would consider antifibrotic's and she is willing.  Meanwhile she will get referred to pulmonary rehabilitation.       SYMPTOM SCALE - ILD 06/11/2018  12/07/2019  01/25/2020 Last Weight  Most recent update: 01/25/2020  4:14 PM    Weight  65.1 kg (143 lb 9.6 oz)             7/7/2022145# 04/27/2021   O2 use _0   Shortness of Breath 0 -> 5 scale with 5 being  worst (score 6 If unable to do)      At rest 0 0 0 0 0  Simple tasks - showers, clothes change, eating, shaving 0 3 0 0 0  Household (dishes, doing bed, laundry) 0 3 3 03 4  Shopping _0 03 0  Walking level at own pace 0 4 0 0 0  Walking stairs 0 _1 Total (40 - 48) Dyspnea Score _2 How bad is your cough? 0 _3 How bad is your fatigue _4 nausea  0 0 0 0  vomit  0 0 0 0  diarrhea  0 0 0 0  anzity  _5 0  Dep[resssio  meds 0 0 0  0    Simple office  walk 185 feet x  3 laps goal with forehead probe 02/19/2018  03/31/2018  06/11/2018  12/07/2019  01/25/2020  10/26/2020  04/27/2021   O2 used rooom air Room air Room air ra ra ra ra  Number laps completed 3 3 x 250 feet 3 x 236 feet 3 laops _6 Comments about pace Moderate pace Mod pace Normal pace avg pace  Avg pace avg  Resting Pulse Ox/HR 99% and 71/min 100% and 89/min 100% and 83/mn 100% and 83/min 99% and 99/min 99% and 83 98% and 83  Final Pulse Ox/HR 95% and 92/min 98% and 111 98% and 101/min 98% and 101/min 98% and 107/min 97% and 99 97% and 101  Desaturated </= 88% _7  non no  Desaturated <= 3% points yes no no no none no no  Got Tachycardic >/= 90/min _8  yes yes  Symptoms at end of test none none none Mild dyspnea none noe Mild dyspnea  Miscellaneous comments x x x       No results found.    PFT  PFT Results Latest Ref Rng & Units 04/27/2021 12/10/2019 06/11/2018  FVC-Pre L 1.98 2.13 2.05  FVC-Predicted Pre % 75 79 74  FVC-Post L - 2.18 -  FVC-Predicted Post % - 81 -  Pre FEV1/FVC % % 89 91 92  Post FEV1/FCV % % - 92 -  FEV1-Pre L 1.77 1.94 1.88  FEV1-Predicted Pre % 88 96 90  FEV1-Post L - 2.01 -  DLCO uncorrected ml/min/mmHg 10.43 - 13.22  DLCO UNC% % 59 - 74  DLCO corrected ml/min/mmHg 10.28 - -  DLCO COR %Predicted % 58 - -  DLVA Predicted % 80 - 95     ROSResults for Tasha [name], Tasha [name] (MRN [id number]) as of 10/26/2020 16:17  Ref. Range 12/19/2017 19:00 12/21/2017 09:17 02/19/2018 10:53 02/19/2018 11:03 11/25/2018 09:57 01/25/2019 09:31 12/07/2019 11:55 01/26/2020 11:13  Anti Nuclear Antibody (ANA) Latest Ref Range: NEGATIVE     Negative  POSITIVE (A)  POSITIVE (A)  ANA Pattern 1 Unknown      Nuclear, Nucleolar (A)  Nuclear, Nucleolar (A)  ANA Titer 1 Latest Units: titer     Positive (A) 1:640 (H)  1:640 (H)  Angiotensin-Converting Enzyme Latest Ref Range: 9 - 67 U/L        16  Anti JO-1 Latest Ref Range: 0.0 - 0.9 AI    <8.1      Cyclic  Citrullin Peptide Ab Latest Units: UNITS     3 <16  <16  ds DNA Ab Latest Units: IU/mL   <1   <1  <  1  DNA-Histone Latest Ref Range: 0.0 - 0.9 Units  0.9        RA Latex Turbid. Latest Ref Range: <14 IU/mL     10.9 <14    ENA SM Ab Ser-aCnc Latest Ref Range: <1.0 NEG AI      <1.0 NEG    Homogeneous Pattern Unknown     1:160 (H)     Speckled Pattern Unknown     1:320 (H)     NOTE: Unknown     Comment     ANA,IFA RA DIAG PNL W/RFLX TIT/PATN Unknown     Rpt (A)      - per HPI  CT Chest data     has a past medical history of Acute respiratory failure ([place]), CAP (community acquired pneumonia), Hyperlipidemia, Hypertension, and Metabolic syndrome.   reports that she has quit smoking. She has never used smokeless tobacco.  Past Surgical History:  Procedure Laterality Date   CESAREAN SECTION     COLONOSCOPY N/A 06/07/2019   Procedure: COLONOSCOPY;  Surgeon: Danie Binder, MD;  Location: [facility];  Service: Endoscopy;  Laterality: N/A;  12:45   POLYPECTOMY  06/07/2019   Procedure: POLYPECTOMY;  Surgeon: Danie Binder, MD;  Location: [facility];  Service: Endoscopy;;    Allergies  Allergen Reactions   Hydromet [Hydrocodone Bit-Homatrop Mbr]     Pt doesn't remember   Toprol Xl [Metoprolol] Hives    Facial rash, itching, hives   Amoxicillin Rash    Did it involve swelling of the face/tongue/throat, SOB, or low BP? No Did it involve sudden or severe rash/hives, skin peeling, or any reaction on the inside of your mouth or nose? Unknown Did you need to seek medical attention at a hospital or doctor's office? Unknown When did it last happen?      10 + years If all above answers are NO, may proceed with cephalosporin use.     Immunization History  Administered Date(s) Administered   Fluad Quad(high Dose 65+) 03/01/2019, 01/14/2020, 01/16/2021   Influenza, High Dose Seasonal PF 03/11/2017, 03/09/2018   Influenza,inj,Quad PF,6+ Mos 02/04/2014, 02/09/2015, 02/20/2016   Moderna  Sars-Covid-2 Vaccination 05/21/2019, 06/18/2019   Pneumococcal Conjugate-13 08/10/2015   Pneumococcal Polysaccharide-23 06/17/2017, 12/21/2017   Zoster Recombinat (Shingrix) 09/21/2020    Family History  Problem Relation Age of Onset   Cancer Father    Healthy Brother    Healthy Daughter    Healthy Sister    Memory loss Sister    Healthy Son    Healthy Son    Breast cancer Niece      Current Outpatient Medications:    acetaminophen (TYLENOL) 500 MG tablet, Take 1 tablet (500 mg total) by mouth every 6 (six) hours as needed., Disp: 30 tablet, Rfl: 0   aspirin EC 81 MG tablet, Take 1 tablet (81 mg total) by mouth daily., Disp: 90 tablet, Rfl: 3   atorvastatin (LIPITOR) 20 MG tablet, Take 1 tablet (20 mg total) by mouth daily at 6 PM., Disp: 90 tablet, Rfl: 3   budesonide (PULMICORT) 180 MCG/ACT inhaler, Inhale 2 puffs into the lungs in the morning and at bedtime., Disp: 1 each, Rfl: 5   cetirizine (ZYRTEC) 10 MG tablet, Take 1 tablet (10 mg total) by mouth daily as needed for allergies., Disp: 90 tablet, Rfl: 3   hydrochlorothiazide (HYDRODIURIL) 25 MG tablet, Take 1 tablet (25 mg total) by mouth daily., Disp: 90 tablet, Rfl: 3   ibuprofen (ADVIL) 200 MG  tablet, Take 400 mg by mouth every 6 (six) hours as needed for headache or moderate pain., Disp: , Rfl:    Ipratropium-Albuterol (COMBIVENT RESPIMAT) 20-100 MCG/ACT AERS respimat, Inhale 1 puff into the lungs every 6 (six) hours as needed for wheezing., Disp: 4 g, Rfl: 5   levothyroxine (SYNTHROID) 50 MCG tablet, Take 1 tablet (50 mcg total) by mouth daily before breakfast., Disp: 90 tablet, Rfl: 3   losartan (COZAAR) 50 MG tablet, Take 1 tablet (50 mg total) by mouth daily., Disp: 90 tablet, Rfl: 3   Omega 3 1000 MG CAPS, Take 1,000 mg by mouth daily. , Disp: , Rfl:    pantoprazole (PROTONIX) 40 MG tablet, Take 1 tablet (40 mg total) by mouth daily., Disp: 90 tablet, Rfl: 3   potassium chloride (KLOR-CON) 20 MEQ tablet, Take 1 tablet  (20 mEq total) by mouth daily., Disp: 90 tablet, Rfl: 3      Objective:   Vitals:   04/27/21 1140  BP: 128/80  Pulse: 83  SpO2: 98%  Weight: 143 lb (64.9 kg)  Height: _0  (1.549 m)    Estimated body mass index is 27.02 kg/m as calculated from the following:   Height as of this encounter: _1  (1.549 m).   Weight as of this encounter: 143 lb (64.9 kg).  _2 @  Filed Weights   04/27/21 1140  Weight: 143 lb (64.9 kg)     Physical Exam General: No distress. Looks well Neuro: Alert and Oriented x 3. GCS 15. Speech normal Psych: Pleasant Resp:  Barrel Chest - no.  Wheeze - no, Crackles - yes base, No overt respiratory distress CVS: Normal heart sounds. Murmurs - no Ext: Stigmata of Connective Tissue Disease - no HEENT: Normal upper airway. PEERL +. No post nasal drip        Assessment:       ICD-10-CM   1. Interstitial lung disease (HCC)  J84.9 CT Chest High Resolution    Pulmonary function test    AMB referral to pulmonary rehabilitation         Plan:     Patient Instructions     ICD-10-CM   1. Interstitial lung disease ([place])  J84.9     2. ANA positive  R76.8     3. History of environmental allergies  Z91.09     Interstitial lung disease ([place])   - -BAL lymphocytosis August 2019 following antifreeze exposure and ANA positivity History of environmental allergies   -Interstitial lung disease might be worse based on breathing test Tasha [name] 2023 compared to Feb 2020 - Last CT chest sept 2021   Plan -Do HRCT supine and prone in 3 monts -Repeat spirometry and DLCO in 3 months - refer Pulmonary rehab at [place] -3 months with Dr. Chase Caller 30-minute slot but after spirometry and DLCO  -ILD symptom score and simple walking desaturation test at the time of follow-up  -  Interpreter needed at follow-up      SIGNATURE    Dr. Brand Males, M.D., F.C.C.P,  Pulmonary and Critical Care Medicine Staff Physician, [place] Director - Interstitial Lung Disease  Program  Pulmonary [place] [facility] [place], [place], [address]  Pager: [phone], If no answer or between  15:00h - 7:00h: call [phone] Telephone: [phone]  12:27 PM 04/27/2021

## 2021-04-27 NOTE — Progress Notes (Signed)
Spirometry and Dlco done today. 

## 2021-04-27 NOTE — Patient Instructions (Addendum)
ICD-10-CM   1. Interstitial lung disease ([place])  J84.9     2. ANA positive  R76.8     3. History of environmental allergies  Z91.09     Interstitial lung disease ([place])   - -BAL lymphocytosis August 2019 following antifreeze exposure and ANA positivity History of environmental allergies   -Interstitial lung disease might be worse based on breathing test Jan 2023 compared to Feb 2020 - Last CT chest sept 2021   Plan -Do HRCT supine and prone in 3 monts -Repeat spirometry and DLCO in 3 months - refer Pulmonary rehab at [place] -3 months with Dr. Chase Caller 30-minute slot but after spirometry and DLCO  -ILD symptom score and simple walking desaturation test at the time of follow-up  -  Interpreter needed at follow-up

## 2021-05-02 ENCOUNTER — Ambulatory Visit
Admission: RE | Admit: 2021-05-02 | Discharge: 2021-05-02 | Disposition: A | Discharge status: Home / Self Care | Payer: Medicare Other | Source: Ambulatory Visit | Attending: Family Medicine | Admitting: Family Medicine

## 2021-05-02 DIAGNOSIS — Z139 Encounter for screening, unspecified: Secondary | ICD-10-CM

## 2021-05-02 DIAGNOSIS — Z1231 Encounter for screening mammogram for malignant neoplasm of breast: Secondary | ICD-10-CM | POA: Diagnosis not present

## 2021-05-06 NOTE — Progress Notes (Signed)
Cardiology Office Note    Date:  05/09/2021   ID:  Tasha, [name] 1950-11-27, MRN [id number]   PCP:  Dettinger, Fransisca Kaufmann, MD   [place]  Cardiologist:  Donato Heinz, MD  Advanced Practice Provider:  No care team member to display Electrophysiologist:  None  [phone]   Chief Complaint  Patient presents with   Chest Pain         History of Present Illness:  Tasha [name] is a 71 y.o. female with history of hypertension, PAF 1 occurrence in the setting of pulmonary stress no repeat on monitor continued on aspirin, coronary calcifications on CT in 2019 normal NST 02/2018, thrombocytopenia, ILD  Patient with short of breath and palpitations 07/2019 low-dose Toprol-XL 25 mg half tablet added.  Monitor showed heart rate 53-137 with PACs rare PVCs and brief burst of SVT. Developed rash and changed to diltiazem.  Echocardiogram 12/18/2017 showed EF 65 to 10%, grade 1 diastolic dysfunction, mild MR, normal RV function, severe TR.  Coronary CTA on 04/05/2021 showed nonobstructive CAD, calcium score 100 (82nd percentile).  Echocardiogram 03/08/2021 showed normal biventricular function, no significant valvular disease.  Zio patch x2 weeks in 02/2021 showed no significant abnormalities.  Since last clinic visit, she reports her chest pain has improved.  Does report it was worse over Christmas but seemed to be related to what he was eating.  She reports occasional dyspnea.  Denies any palpitations or syncope, does report some lightheadedness when bending over.  Past Medical History:  Diagnosis Date   Acute respiratory failure ([place])    CAP (community acquired pneumonia)    Hyperlipidemia    Hypertension    Metabolic syndrome     Past Surgical History:  Procedure Laterality Date   CESAREAN SECTION     COLONOSCOPY N/A 06/07/2019   Procedure: COLONOSCOPY;  Surgeon: Danie Binder, MD;  [facility];  Service: Endoscopy;  Laterality:  N/A;  12:45   POLYPECTOMY  06/07/2019   Procedure: POLYPECTOMY;  Surgeon: Danie Binder, MD;  [facility];  Service: Endoscopy;;    Current Medications: Current Meds  Medication Sig   acetaminophen (TYLENOL) 500 MG tablet Take 1 tablet (500 mg total) by mouth every 6 (six) hours as needed.   aspirin EC 81 MG tablet Take 1 tablet (81 mg total) by mouth daily.   atorvastatin (LIPITOR) 20 MG tablet Take 1 tablet (20 mg total) by mouth daily at 6 PM.   budesonide (PULMICORT) 180 MCG/ACT inhaler Inhale 2 puffs into the lungs in the morning and at bedtime.   cetirizine (ZYRTEC) 10 MG tablet Take 1 tablet (10 mg total) by mouth daily as needed for allergies.   hydrochlorothiazide (HYDRODIURIL) 25 MG tablet Take 1 tablet (25 mg total) by mouth daily.   ibuprofen (ADVIL) 200 MG tablet Take 400 mg by mouth every 6 (six) hours as needed for headache or moderate pain.   Ipratropium-Albuterol (COMBIVENT RESPIMAT) 20-100 MCG/ACT AERS respimat Inhale 1 puff into the lungs every 6 (six) hours as needed for wheezing.   levothyroxine (SYNTHROID) 50 MCG tablet Take 1 tablet (50 mcg total) by mouth daily before breakfast.   losartan (COZAAR) 50 MG tablet Take 1 tablet (50 mg total) by mouth daily.   Omega 3 1000 MG CAPS Take 1,000 mg by mouth daily.    pantoprazole (PROTONIX) 40 MG tablet Take 1 tablet (40 mg total) by mouth daily.   potassium chloride (KLOR-CON) 20 MEQ  tablet Take 1 tablet (20 mEq total) by mouth daily.     Allergies:   Hydromet [hydrocodone bit-homatrop mbr], Toprol xl [metoprolol], and Amoxicillin   Social History   Socioeconomic History   Marital status: Married    Spouse name: Ramiro   Number of children: 3   Years of education: 6   Highest education level: 6th grade  Occupational History   Occupation: retired  Tobacco Use   Smoking status: Former   Smokeless tobacco: Never  Scientific laboratory technician Use: Never used  Substance and Sexual Activity   Alcohol use: No    Drug use: No   Sexual activity: Not Currently    Birth control/protection: Post-menopausal  Other Topics Concern   Not on file  Social History Narrative   Lives home with her husband - active in church - children live nearby   Social Determinants of Health   Financial Resource Strain: Low Risk    Difficulty of Paying Living Expenses: Not hard at all  Food Insecurity: No Food Insecurity   Worried About Charity fundraiser in the Last Year: Never true   Arboriculturist in the Last Year: Never true  Transportation Needs: No Transportation Needs   Lack of Transportation (Medical): No   Lack of Transportation (Non-Medical): No  Physical Activity: Insufficiently Active   Days of Exercise per Week: 3 days   Minutes of Exercise per Session: 30 min  Stress: No Stress Concern Present   Feeling of Stress : Not at all  Social Connections: Socially Integrated   Frequency of Communication with Friends and Family: More than three times a week   Frequency of Social Gatherings with Friends and Family: More than three times a week   Attends Religious Services: More than 4 times per year   Active Member of [employer] or Organizations: Yes   Attends Music therapist: More than 4 times per year   Marital Status: Married     Family History:  The patient's family history includes Breast cancer in her niece; Cancer in her father; Healthy in her brother, daughter, sister, son, and son; Memory loss in her sister.   ROS:   Please see the history of present illness.    ROS All other systems reviewed and are negative.   PHYSICAL EXAM:   VS:  BP 134/76    Pulse 89    Wt 143 lb (64.9 kg)    SpO2 98%    BMI 27.02 kg/m   Physical Exam  GEN: Well nourished, well developed, in no acute distress  Neck: no JVD, carotid bruits Cardiac:RRR; no murmurs, rubs, or gallops  Respiratory:  clear to auscultation bilaterally, normal work of breathing GI: soft, nontender Ext: without cyanosis, clubbing, or  edema Neuro:  Alert and Oriented x 3 Psych: euthymic mood, full affect  Wt Readings from Last 3 Encounters:  05/09/21 143 lb (64.9 kg)  04/27/21 143 lb (64.9 kg)  03/23/21 144 lb (65.3 kg)      Studies/Labs Reviewed:   EKG: No EKG today  Recent Labs: 03/23/2021: ALT 97; BUN 15; Creatinine, Ser 0.86; Hemoglobin 13.9; Platelets 169; Potassium 3.9; Sodium 143; TSH 1.570   Lipid Panel    Component Value Date/Time   CHOL 125 03/23/2021 0953   CHOL 180 09/23/2012 1335   TRIG 94 03/23/2021 0953   TRIG 108 08/10/2014 0852   TRIG 135 09/23/2012 1335   HDL 41 03/23/2021 0953   HDL 40 08/10/2014 0852  HDL 37 (L) 09/23/2012 1335   CHOLHDL 3.0 03/23/2021 0953   LDLCALC 66 03/23/2021 0953   LDLCALC 118 (H) 05/04/2013 1528   LDLCALC 116 (H) 09/23/2012 1335   LDLDIRECT 77 07/11/2016 1536    Additional studies/ records that were reviewed today include:  Monitor 08/23/19  . [employer] monitor resulted, 8 days 8 hours 21 minutes analyzed. Predominant rhythm is sinus with heart rate ranging from 53 bpm up to 137 bpm and average heart rate 78 bpm. Rare PACs were noted representing less than 1% of total beats. Very rare PVCs noted. Brief bursts of SVT noted, the longest of which was 7 beats. There were no sustained arrhythmias or pauses. No definite atrial fibrillation. Patient triggered events did not clearly correlate with arrhythmia.    Echo 12/18/17  Study Conclusions   - Left ventricle: The cavity size was normal. Systolic function was    vigorous. The estimated ejection fraction was in the range of 65%    to 70%. Wall motion was normal; there were no regional wall    motion abnormalities. Doppler parameters are consistent with    abnormal left ventricular relaxation (grade 1 diastolic    dysfunction). There was no evidence of elevated ventricular    filling pressure by Doppler parameters.  - Aortic valve: Valve area (VTI): 1.43 cm^2. Valve area (Vmax):    1.67 cm^2. Valve area  (Vmean): 1.63 cm^2.  - Aortic root: The aortic root was normal in size.  - Mitral valve: There was mild regurgitation.  - Left atrium: The atrium was normal in size.  - Right ventricle: Systolic function was normal.  - Tricuspid valve: There was severe regurgitation.  - Pulmonary arteries: Systolic pressure was moderately increased.    PA peak pressure: 54 mm Hg (S).  - Inferior vena cava: The vessel was normal in size.  - Pericardium, extracardiac: There was no pericardial effusion.    Monitor with some skipping and faster HR but no a fib and has resolved with dilt.   08/23/19   Nuc study 03/15/18 no ischemia    CT chest high resolution 12/30/19 There is aortic atherosclerosis, as well as atherosclerosis of the great vessels of the mediastinum and the coronary arteries, including calcified atherosclerotic plaque in the left main, left anterior descending and right coronary arteries.   The appearance of the lungs is compatible with interstitial lung disease, with mild progression of changes compared to the prior study, with a spectrum of findings considered probable usual interstitial pneumonia (UIP) per current ATS guidelines.     Risk Assessment/Calculations:    CHA2DS2-VASc Score = 4  This indicates a 4.8% annual risk of stroke. The patient's score is based upon: CHF History: 0 HTN History: 1 Diabetes History: 0 Stroke History: 0 Vascular Disease History: 1 Age Score: 1 Gender Score: 1        ASSESSMENT:    1. Coronary artery disease involving native coronary artery of native heart without angina pectoris   2. Syncope and collapse   3. Paroxysmal atrial fibrillation (HCC)   4. Essential hypertension   5. Hyperlipidemia, unspecified hyperlipidemia type     PLAN:   CAD: Symptoms concerning for typical angina, as describes chest tightness when walking fast, resolves with slowing down.  Had coronary calcifications on CT with normal nuclear study in 2019.  Coronary  CTA on 04/05/2021 showed nonobstructive CAD, calcium score 100 (82nd percentile).  Echocardiogram 03/08/2021 showed normal biventricular function, no significant valvular disease.  No further cardiac  work-up recommended at this time  Syncope: Zio patch x2 weeks in 02/2021 showed no significant abnormalities.  Echocardiogram 03/08/2021 showed normal biventricular function, no significant valvular disease.  PAF: occurrence when hospitalized with pulmonary issues no recurrence on follow-up [employer] monitor x 14 days 08/2019 or repeat monitor 02/2021.  Has been maintained on aspirin, allergy to metoprolol   Hypertension on hydrochlorothiazide 25 mg daily, losartan 50 mg daily.  Appears controlled.  Valvular heart disease with mild mitral and severe tricuspid regurgitation with moderately elevated pulmonary pressures likely secondary to interstitial lung disease.  Repeat echocardiogram 02/2021 showed normal pulmonary pressures, mild TR  Hyperlipidemia on atorvastatin 20 mg daily. LDL 66 on 03/23/21.  RTC in 1 year   Shared Decision Making/Informed Consent        Medication Adjustments/Labs and Tests Ordered: Current medicines are reviewed at length with the patient today.  Concerns regarding medicines are outlined above.  Medication changes, Labs and Tests ordered today are listed in the Patient Instructions below. Patient Instructions  Medication Instructions:  Your physician recommends that you continue on your current medications as directed. Please refer to the Current Medication list given to you today.  *If you need a refill on your cardiac medications before your next appointment, please call your pharmacy*   Lab Work: None If you have labs (blood work) drawn today and your tests are completely normal, you will receive your results only by: [place] (if you have MyChart) OR A paper copy in the mail If you have any lab test that is abnormal or we need to change your treatment,  we will call you to review the results.   Testing/Procedures: None   Follow-Up: At [facility], you and your health needs are our priority.  As part of our continuing mission to provide you with exceptional heart care, we have created designated Provider Care Teams.  These Care Teams include your primary Cardiologist (physician) and Advanced Practice Providers (APPs -  Physician Assistants and Nurse Practitioners) who all work together to provide you with the care you need, when you need it.  We recommend signing up for the patient portal called "MyChart".  Sign up information is provided on this After Visit Summary.  MyChart is used to connect with patients for Virtual Visits (Telemedicine).  Patients are able to view lab/test results, encounter notes, upcoming appointments, etc.  Non-urgent messages can be sent to your provider as well.   To learn more about what you can do with MyChart, go to [email or web address].    Your next appointment:   1 year(s)  The format for your next appointment:   In Person  Provider:   Oswaldo Milian, MD     Other Instructions      Signed, Donato Heinz, MD  05/09/2021 10:31 AM

## 2021-05-09 ENCOUNTER — Ambulatory Visit (INDEPENDENT_AMBULATORY_CARE_PROVIDER_SITE_OTHER): Payer: Medicare Other | Admitting: Cardiology

## 2021-05-09 ENCOUNTER — Encounter: Payer: Self-pay | Admitting: Cardiology

## 2021-05-09 VITALS — BP 134/76 | HR 89 | Wt 143.0 lb

## 2021-05-09 DIAGNOSIS — R55 Syncope and collapse: Secondary | ICD-10-CM | POA: Diagnosis not present

## 2021-05-09 DIAGNOSIS — I480 Paroxysmal atrial fibrillation: Secondary | ICD-10-CM | POA: Diagnosis not present

## 2021-05-09 DIAGNOSIS — I2510 Atherosclerotic heart disease of native coronary artery without angina pectoris: Secondary | ICD-10-CM | POA: Diagnosis not present

## 2021-05-09 DIAGNOSIS — I10 Essential (primary) hypertension: Secondary | ICD-10-CM

## 2021-05-09 DIAGNOSIS — E785 Hyperlipidemia, unspecified: Secondary | ICD-10-CM

## 2021-05-09 NOTE — Patient Instructions (Signed)
Medication Instructions:  Your physician recommends that you continue on your current medications as directed. Please refer to the Current Medication list given to you today.  *If you need a refill on your cardiac medications before your next appointment, please call your pharmacy*   Lab Work: None If you have labs (blood work) drawn today and your tests are completely normal, you will receive your results only by: [place] (if you have MyChart) OR A paper copy in the mail If you have any lab test that is abnormal or we need to change your treatment, we will call you to review the results.   Testing/Procedures: None   Follow-Up: At [facility], you and your health needs are our priority.  As part of our continuing mission to provide you with exceptional heart care, we have created designated Provider Care Teams.  These Care Teams include your primary Cardiologist (physician) and Advanced Practice Providers (APPs -  Physician Assistants and Nurse Practitioners) who all work together to provide you with the care you need, when you need it.  We recommend signing up for the patient portal called "MyChart".  Sign up information is provided on this After Visit Summary.  MyChart is used to connect with patients for Virtual Visits (Telemedicine).  Patients are able to view lab/test results, encounter notes, upcoming appointments, etc.  Non-urgent messages can be sent to your provider as well.   To learn more about what you can do with MyChart, go to [email or web address].    Your next appointment:   1 year(s)  The format for your next appointment:   In Person  Provider:   Oswaldo Milian, MD     Other Instructions

## 2021-05-17 ENCOUNTER — Encounter: Payer: Self-pay | Admitting: Family Medicine

## 2021-05-17 ENCOUNTER — Ambulatory Visit (INDEPENDENT_AMBULATORY_CARE_PROVIDER_SITE_OTHER): Payer: Medicare Other | Admitting: Family Medicine

## 2021-05-17 DIAGNOSIS — J069 Acute upper respiratory infection, unspecified: Secondary | ICD-10-CM | POA: Diagnosis not present

## 2021-05-17 MED ORDER — GUAIFENESIN ER 600 MG PO TB12: 600.0000 mg | ORAL_TABLET | Freq: Two times a day (BID) | ORAL | 0 refills | Status: DC

## 2021-05-17 NOTE — Progress Notes (Signed)
Virtual Visit via telephone Note  I connected with Tasha [name] on 05/17/21 at 1739 by telephone and verified that I am speaking with the correct person using two identifiers. Tasha [name] is currently located at home and patient are currently with her during visit. The provider, Fransisca Kaufmann Tanikka Bresnan, MD is located in their office at time of visit.  Call ended at 1747  I discussed the limitations, risks, security and privacy concerns of performing an evaluation and management service by telephone and the availability of in person appointments. I also discussed with the patient that there may be a patient responsible charge related to this service. The patient expressed understanding and agreed to proceed.   History and Present Illness: Patient is calling in for cough and congestion and sore throat and headache and chills and fevers.  She is having trouble swallowing. Started yesterday morning. She feels feverish.  She took tylenol and it helped fever.  She denies sick contacts that she knows of.  She was at church on Sunday and things she got wet and contracted it there.   1. Upper respiratory tract infection, unspecified type     Outpatient Encounter Medications as of 05/17/2021  Medication Sig   guaiFENesin (MUCINEX) 600 MG 12 hr tablet Take 1 tablet (600 mg total) by mouth 2 (two) times daily.   acetaminophen (TYLENOL) 500 MG tablet Take 1 tablet (500 mg total) by mouth every 6 (six) hours as needed.   aspirin EC 81 MG tablet Take 1 tablet (81 mg total) by mouth daily.   atorvastatin (LIPITOR) 20 MG tablet Take 1 tablet (20 mg total) by mouth daily at 6 PM.   budesonide (PULMICORT) 180 MCG/ACT inhaler Inhale 2 puffs into the lungs in the morning and at bedtime.   cetirizine (ZYRTEC) 10 MG tablet Take 1 tablet (10 mg total) by mouth daily as needed for allergies.   hydrochlorothiazide (HYDRODIURIL) 25 MG tablet Take 1 tablet (25 mg total) by mouth daily.   ibuprofen (ADVIL) 200 MG  tablet Take 400 mg by mouth every 6 (six) hours as needed for headache or moderate pain.   Ipratropium-Albuterol (COMBIVENT RESPIMAT) 20-100 MCG/ACT AERS respimat Inhale 1 puff into the lungs every 6 (six) hours as needed for wheezing.   levothyroxine (SYNTHROID) 50 MCG tablet Take 1 tablet (50 mcg total) by mouth daily before breakfast.   losartan (COZAAR) 50 MG tablet Take 1 tablet (50 mg total) by mouth daily.   Omega 3 1000 MG CAPS Take 1,000 mg by mouth daily.    pantoprazole (PROTONIX) 40 MG tablet Take 1 tablet (40 mg total) by mouth daily.   potassium chloride (KLOR-CON) 20 MEQ tablet Take 1 tablet (20 mEq total) by mouth daily.   No facility-administered encounter medications on file as of 05/17/2021.    Review of Systems  Constitutional:  Positive for chills and fever.  HENT:  Positive for congestion, postnasal drip, rhinorrhea, sinus pressure and sore throat. Negative for ear discharge, ear pain and sneezing.   Eyes:  Negative for pain, redness and visual disturbance.  Respiratory:  Positive for cough. Negative for chest tightness and shortness of breath.   Cardiovascular:  Negative for chest pain and leg swelling.  Genitourinary:  Negative for difficulty urinating and dysuria.  Musculoskeletal:  Positive for myalgias. Negative for back pain and gait problem.  Skin:  Negative for rash.  Neurological:  Negative for light-headedness and headaches.  Psychiatric/Behavioral:  Negative for agitation and behavioral problems.   All other  systems reviewed and are negative.  Observations/Objective: Patient sounds comfortable and in no acute distress  Assessment and Plan: Problem List Items Addressed This Visit   None Visit Diagnoses     Upper respiratory tract infection, unspecified type    -  Primary   Relevant Medications   guaiFENesin (MUCINEX) 600 MG 12 hr tablet   Other Relevant Orders   Veritor Flu A/B Waived   Rapid Strep Screen ([facility] ONLY)   Novel Coronavirus,  NAA ([facility])     Patient is coming in for COVID flu and strep testing tomorrow  Follow up plan: Return if symptoms worsen or fail to improve.     I discussed the assessment and treatment plan with the patient. The patient was provided an opportunity to ask questions and all were answered. The patient agreed with the plan and demonstrated an understanding of the instructions.   The patient was advised to call back or seek an in-person evaluation if the symptoms worsen or if the condition fails to improve as anticipated.  The above assessment and management plan was discussed with the patient. The patient verbalized understanding of and has agreed to the management plan. Patient is aware to call the clinic if symptoms persist or worsen. Patient is aware when to return to the clinic for a follow-up visit. Patient educated on when it is appropriate to go to the emergency department.    I provided 8 minutes of non-face-to-face time during this encounter.    Worthy Rancher, MD

## 2021-05-18 ENCOUNTER — Other Ambulatory Visit: Payer: Medicare Other

## 2021-05-18 DIAGNOSIS — J069 Acute upper respiratory infection, unspecified: Secondary | ICD-10-CM

## 2021-05-18 DIAGNOSIS — U071 COVID-19: Secondary | ICD-10-CM

## 2021-05-18 LAB — VERITOR FLU A/B WAIVED
Influenza A: NEGATIVE
Influenza B: NEGATIVE

## 2021-05-18 LAB — RAPID STREP SCREEN (MED CTR MEBANE ONLY): Strep Gp A Ag, IA W/Reflex: NEGATIVE

## 2021-05-18 LAB — CULTURE, GROUP A STREP

## 2021-05-19 LAB — NOVEL CORONAVIRUS, NAA: SARS-CoV-2, NAA: DETECTED — AB

## 2021-05-19 LAB — SARS-COV-2, NAA 2 DAY TAT

## 2021-05-21 MED ORDER — MOLNUPIRAVIR EUA 200MG CAPSULE: 4.0000 | ORAL_CAPSULE | Freq: Two times a day (BID) | ORAL | 0 refills | Status: AC

## 2021-05-21 MED ORDER — MOLNUPIRAVIR EUA 200MG CAPSULE
4.0000 | ORAL_CAPSULE | Freq: Two times a day (BID) | ORAL | 0 refills | Status: DC
Start: 2021-05-21 — End: 2021-05-21

## 2021-05-21 NOTE — Addendum Note (Signed)
Addended by: Ladean Raya on: 05/21/2021 04:34 PM   Modules accepted: Orders

## 2021-05-21 NOTE — Addendum Note (Signed)
Addended by: Caryl Pina on: 05/21/2021 12:58 PM   Modules accepted: Orders

## 2021-05-21 NOTE — Progress Notes (Signed)
Patient was COVID-positive which is likely the cause of the infection, so I sent the antiviral medicine for her

## 2021-05-22 ENCOUNTER — Telehealth: Payer: Self-pay | Admitting: Family Medicine

## 2021-05-22 ENCOUNTER — Other Ambulatory Visit: Payer: Self-pay | Admitting: Family Medicine

## 2021-05-22 LAB — PULMONARY FUNCTION TEST
DL/VA % pred: 80 %
DL/VA: 3.42 ml/min/mmHg/L
DLCO cor % pred: 58 %
DLCO cor: 10.28 ml/min/mmHg
DLCO unc % pred: 59 %
DLCO unc: 10.43 ml/min/mmHg
FEF 25-75 Pre: 2.28 L/sec
FEF2575-%Pred-Pre: 131 %
FEV1-%Pred-Pre: 88 %
FEV1-Pre: 1.77 L
FEV1FVC-%Pred-Pre: 117 %
FEV6-%Pred-Pre: 78 %
FEV6-Pre: 1.98 L
FEV6FVC-%Pred-Pre: 104 %
FVC-%Pred-Pre: 75 %
FVC-Pre: 1.98 L
Pre FEV1/FVC ratio: 89 %
Pre FEV6/FVC Ratio: 100 %

## 2021-05-22 MED ORDER — ONDANSETRON 8 MG PO TBDP: 8.0000 mg | ORAL_TABLET | Freq: Four times a day (QID) | ORAL | 1 refills | Status: AC | PRN

## 2021-05-22 NOTE — Telephone Encounter (Signed)
Pts daughter called stating that the medicine Dr Dettinger prescribed to pt recently for COVID is making her vomit. Needs advise on what to do?

## 2021-05-22 NOTE — Telephone Encounter (Signed)
Ondansetron sent in for the patient's nausea.  Try to continue the COVID medicine if at all possible

## 2021-05-23 NOTE — Telephone Encounter (Signed)
Left message to call back  

## 2021-05-23 NOTE — Telephone Encounter (Signed)
Patients daughter aware

## 2021-05-29 ENCOUNTER — Other Ambulatory Visit: Payer: Self-pay

## 2021-05-29 ENCOUNTER — Encounter (HOSPITAL_COMMUNITY)
Admission: RE | Admit: 2021-05-29 | Discharge: 2021-05-29 | Disposition: A | Discharge status: Home / Self Care | Payer: Medicare Other | Source: Ambulatory Visit | Attending: Internal Medicine | Admitting: Internal Medicine

## 2021-05-29 VITALS — BP 138/82 | HR 88 | Ht 61.0 in | Wt 141.5 lb

## 2021-05-29 DIAGNOSIS — J849 Interstitial pulmonary disease, unspecified: Secondary | ICD-10-CM

## 2021-05-29 NOTE — Progress Notes (Signed)
Pulmonary Individual Treatment Plan  Patient Details  Name: Tasha [name] MRN: [id number] Date of Birth: May 25, 1950 Referring Provider:   Flowsheet Row PULMONARY REHAB OTHER RESP ORIENTATION from 05/29/2021 in [facility]  Referring Provider Dr. Chase Caller       Initial Encounter Date:  Flowsheet Row PULMONARY REHAB OTHER RESP ORIENTATION from 05/29/2021 in [facility]  Date 05/29/21       Visit Diagnosis: Interstitial lung disease ([place])  Patient's Home Medications on Admission:   Current Outpatient Medications:    acetaminophen (TYLENOL) 500 MG tablet, Take 1 tablet (500 mg total) by mouth every 6 (six) hours as needed., Disp: 30 tablet, Rfl: 0   aspirin EC 81 MG tablet, Take 1 tablet (81 mg total) by mouth daily., Disp: 90 tablet, Rfl: 3   atorvastatin (LIPITOR) 20 MG tablet, Take 1 tablet (20 mg total) by mouth daily at 6 PM., Disp: 90 tablet, Rfl: 3   budesonide (PULMICORT) 180 MCG/ACT inhaler, Inhale 2 puffs into the lungs in the morning and at bedtime., Disp: 1 each, Rfl: 5   cetirizine (ZYRTEC) 10 MG tablet, Take 1 tablet (10 mg total) by mouth daily as needed for allergies., Disp: 90 tablet, Rfl: 3   hydrochlorothiazide (HYDRODIURIL) 25 MG tablet, Take 1 tablet (25 mg total) by mouth daily., Disp: 90 tablet, Rfl: 3   ibuprofen (ADVIL) 200 MG tablet, Take 400 mg by mouth every 6 (six) hours as needed for headache or moderate pain., Disp: , Rfl:    Ipratropium-Albuterol (COMBIVENT RESPIMAT) 20-100 MCG/ACT AERS respimat, Inhale 1 puff into the lungs every 6 (six) hours as needed for wheezing., Disp: 4 g, Rfl: 5   levothyroxine (SYNTHROID) 50 MCG tablet, Take 1 tablet (50 mcg total) by mouth daily before breakfast., Disp: 90 tablet, Rfl: 3   losartan (COZAAR) 50 MG tablet, Take 1 tablet (50 mg total) by mouth daily., Disp: 90 tablet, Rfl: 3   Omega 3 1000 MG CAPS, Take 1,000 mg by mouth daily. , Disp: , Rfl:    pantoprazole (PROTONIX) 40  MG tablet, Take 1 tablet (40 mg total) by mouth daily., Disp: 90 tablet, Rfl: 3   potassium chloride (KLOR-CON) 20 MEQ tablet, Take 1 tablet (20 mEq total) by mouth daily., Disp: 90 tablet, Rfl: 3   diltiazem (CARDIZEM CD) 120 MG 24 hr capsule, Take 120 mg by mouth daily., Disp: , Rfl:    guaiFENesin (MUCINEX) 600 MG 12 hr tablet, Take 1 tablet (600 mg total) by mouth 2 (two) times daily. (Patient not taking: Reported on 05/29/2021), Disp: 60 tablet, Rfl: 0   ondansetron (ZOFRAN-ODT) 8 MG disintegrating tablet, Take 1 tablet (8 mg total) by mouth every 6 (six) hours as needed for nausea or vomiting. (Patient not taking: Reported on 05/29/2021), Disp: 20 tablet, Rfl: 1  Past Medical History: Past Medical History:  Diagnosis Date   Acute respiratory failure ([place])    CAP (community acquired pneumonia)    Hyperlipidemia    Hypertension    Metabolic syndrome     Tobacco Use: Social History   Tobacco Use  Smoking Status Former  Smokeless Tobacco Never    Labs: Recent Review Scientist, physiological     Labs for ITP Cardiac and Pulmonary Rehab Latest Ref Rng & Units 04/14/2019 07/14/2019 10/18/2019 03/23/2020 03/23/2021   Cholestrol 100 - 199 mg/dL 116 - 117 135 125   LDLCALC 0 - 99 mg/dL 60 - 51 71 66   LDLDIRECT 0 - 99 mg/dL - - - - -  HDL >39 mg/dL 37(L) - 36(L) 45 41   Trlycerides 0 - 149 mg/dL 101 - 177(H) 105 94   Hemoglobin A1c 4.8 - 5.6 % 5.6 5.3 5.6 5.3 5.1   PHART 7.350 - 7.450 - - - - -   PCO2ART 32.0 - 48.0 mmHg - - - - -   HCO3 20.0 - 28.0 mmol/L - - - - -   TCO2 22 - 32 mmol/L - - - - -   O2SAT % - - - - -       Capillary Blood Glucose: Lab Results  Component Value Date   GLUCAP 165 (H) 01/09/2018   GLUCAP 142 (H) 01/08/2018   GLUCAP 139 (H) 01/08/2018   GLUCAP 117 (H) 01/08/2018   GLUCAP 85 01/08/2018     Pulmonary Assessment Scores:  Pulmonary Assessment Scores     Row Name 05/29/21 1305         ADL UCSD   ADL Phase Entry     SOB Score total 13     Rest 0      Walk 1     Stairs 4     Bath 0     Dress 0     Shop 0       CAT Score   CAT Score 12       mMRC Score   mMRC Score 2             UCSD: Self-administered rating of dyspnea associated with activities of daily living (ADLs) 6-point scale (0 = "not at all" to 5 = "maximal or unable to do because of breathlessness")  Scoring Scores range from 0 to 120.  Minimally important difference is 5 units  CAT: CAT can identify the health impairment of COPD patients and is better correlated with disease progression.  CAT has a scoring range of zero to 40. The CAT score is classified into four groups of low (less than 10), medium (10 - 20), high (21-30) and very high (31-40) based on the impact level of disease on health status. A CAT score over 10 suggests significant symptoms.  A worsening CAT score could be explained by an exacerbation, poor medication adherence, poor inhaler technique, or progression of COPD or comorbid conditions.  CAT MCID is 2 points  mMRC: mMRC (Modified Medical Research Council) Dyspnea Scale is used to assess the degree of baseline functional disability in patients of respiratory disease due to dyspnea. No minimal important difference is established. A decrease in score of 1 point or greater is considered a positive change.   Pulmonary Function Assessment:   Exercise Target Goals: Exercise Program Goal: Individual exercise prescription set using results from initial 6 min walk test and THRR while considering  patients activity barriers and safety.   Exercise Prescription Goal: Initial exercise prescription builds to 30-45 minutes a day of aerobic activity, 2-3 days per week.  Home exercise guidelines will be given to patient during program as part of exercise prescription that the participant will acknowledge.  Activity Barriers & Risk Stratification:  Activity Barriers & Cardiac Risk Stratification - 05/29/21 1304       Activity Barriers & Cardiac Risk  Stratification   Activity Barriers Shortness of Breath    Cardiac Risk Stratification Moderate             6 Minute Walk:  6 Minute Walk     Row Name 05/29/21 1456         6 Minute Walk  Phase Initial     Distance 1150 feet     Walk Time 6 minutes     # of Rest Breaks 0     MPH 2.18     METS 2.74     RPE 10     Perceived Dyspnea  10     VO2 Peak 9.58     Symptoms No     Resting HR 88 bpm     Resting BP 138/82     Resting Oxygen Saturation  95 %     Exercise Oxygen Saturation  during 6 min walk 93 %     Max Ex. HR 111 bpm     Max Ex. BP 140/84     2 Minute Post BP 130/78       Interval HR   1 Minute HR 105     2 Minute HR 110     3 Minute HR 111     4 Minute HR 108     5 Minute HR 111     6 Minute HR 110     2 Minute Post HR 92     Interval Heart Rate? Yes       Interval Oxygen   Interval Oxygen? Yes     Baseline Oxygen Saturation % 95 %     1 Minute Oxygen Saturation % 93 %     1 Minute Liters of Oxygen 0 L     2 Minute Oxygen Saturation % 94 %     2 Minute Liters of Oxygen 0 L     3 Minute Oxygen Saturation % 93 %     3 Minute Liters of Oxygen 0 L     4 Minute Oxygen Saturation % 93 %     4 Minute Liters of Oxygen 0 L     5 Minute Oxygen Saturation % 93 %     5 Minute Liters of Oxygen 0 L     6 Minute Oxygen Saturation % 93 %     6 Minute Liters of Oxygen 0 L     2 Minute Post Oxygen Saturation % 96 %     2 Minute Post Liters of Oxygen 0 L              Oxygen Initial Assessment:  Oxygen Initial Assessment - 05/29/21 1502       Initial 6 min Walk   Oxygen Used None      Program Oxygen Prescription   Program Oxygen Prescription None      Intervention   Short Term Goals To learn and understand importance of monitoring SPO2 with pulse oximeter and demonstrate accurate use of the pulse oximeter.;To learn and demonstrate proper pursed lip breathing techniques or other breathing techniques. ;To learn and understand importance of maintaining  oxygen saturations>88%    Long  Term Goals Compliance with respiratory medication;Exhibits proper breathing techniques, such as pursed lip breathing or other method taught during program session;Maintenance of O2 saturations>88%;Verbalizes importance of monitoring SPO2 with pulse oximeter and return demonstration             Oxygen Re-Evaluation:   Oxygen Discharge (Final Oxygen Re-Evaluation):   Initial Exercise Prescription:  Initial Exercise Prescription - 05/29/21 1400       Date of Initial Exercise RX and Referring Provider   Date 05/29/21    Referring Provider Dr. Chase Caller    Expected Discharge Date 10/02/21      Treadmill   MPH 2  Grade 0    Minutes 17      NuStep   Level 1    SPM 80    Minutes 22      Prescription Details   Frequency (times per week) 2    Duration Progress to 30 minutes of continuous aerobic without signs/symptoms of physical distress      Intensity   THRR 40-80% of Max Heartrate 60-120    Ratings of Perceived Exertion 11-13    Perceived Dyspnea 0-4      Resistance Training   Training Prescription Yes    Weight 3 lbs    Reps 10-15             Perform Capillary Blood Glucose checks as needed.  Exercise Prescription Changes:   Exercise Comments:   Exercise Goals and Review:   Exercise Goals     Row Name 05/29/21 1501             Exercise Goals   Increase Physical Activity Yes       Intervention Provide advice, education, support and counseling about physical activity/exercise needs.;Develop an individualized exercise prescription for aerobic and resistive training based on initial evaluation findings, risk stratification, comorbidities and participant's personal goals.       Expected Outcomes Short Term: Attend rehab on a regular basis to increase amount of physical activity.;Long Term: Exercising regularly at least 3-5 days a week.;Long Term: Add in home exercise to make exercise part of routine and to increase  amount of physical activity.       Increase Strength and Stamina Yes       Intervention Provide advice, education, support and counseling about physical activity/exercise needs.;Develop an individualized exercise prescription for aerobic and resistive training based on initial evaluation findings, risk stratification, comorbidities and participant's personal goals.       Expected Outcomes Short Term: Perform resistance training exercises routinely during rehab and add in resistance training at home;Short Term: Increase workloads from initial exercise prescription for resistance, speed, and METs.;Long Term: Improve cardiorespiratory fitness, muscular endurance and strength as measured by increased METs and functional capacity (6MWT)       Able to understand and use rate of perceived exertion (RPE) scale Yes       Intervention Provide education and explanation on how to use RPE scale       Expected Outcomes Long Term:  Able to use RPE to guide intensity level when exercising independently;Short Term: Able to use RPE daily in rehab to express subjective intensity level       Able to understand and use Dyspnea scale Yes       Intervention Provide education and explanation on how to use Dyspnea scale       Expected Outcomes Short Term: Able to use Dyspnea scale daily in rehab to express subjective sense of shortness of breath during exertion;Long Term: Able to use Dyspnea scale to guide intensity level when exercising independently       Knowledge and understanding of Target Heart Rate Range (THRR) Yes       Intervention Provide education and explanation of THRR including how the numbers were predicted and where they are located for reference       Expected Outcomes Short Term: Able to state/look up THRR;Long Term: Able to use THRR to govern intensity when exercising independently;Short Term: Able to use daily as guideline for intensity in rehab       Understanding of Exercise Prescription Yes  Intervention Provide education, explanation, and written materials on patient's individual exercise prescription       Expected Outcomes Short Term: Able to explain program exercise prescription;Long Term: Able to explain home exercise prescription to exercise independently                Exercise Goals Re-Evaluation :   Discharge Exercise Prescription (Final Exercise Prescription Changes):   Nutrition:  Target Goals: Understanding of nutrition guidelines, daily intake of sodium 1500mg , cholesterol 200mg , calories 30% from fat and 7% or less from saturated fats, daily to have 5 or more servings of fruits and vegetables.  Biometrics:  Pre Biometrics - 05/29/21 1501       Pre Biometrics   Height 5\' 1"  (1.549 m)    Weight 141 lb 8.6 oz (64.2 kg)    Waist Circumference 35 inches    Hip Circumference 39.5 inches    Waist to Hip Ratio 0.89 %    BMI (Calculated) 26.76    Triceps Skinfold 35 mm    % Body Fat 40.4 %    Grip Strength 22.7 kg    Flexibility 0 in    Single Leg Stand 25.15 seconds              Nutrition Therapy Plan and Nutrition Goals:  Nutrition Therapy & Goals - 05/29/21 1313       Intervention Plan   Intervention Prescribe, educate and counsel regarding individualized specific dietary modifications aiming towards targeted core components such as weight, hypertension, lipid management, diabetes, heart failure and other comorbidities.;Nutrition handout(s) given to patient.    Expected Outcomes Short Term Goal: Understand basic principles of dietary content, such as calories, fat, sodium, cholesterol and nutrients.;Long Term Goal: Adherence to prescribed nutrition plan.             Nutrition Assessments:  Nutrition Assessments - 05/29/21 1321       MEDFICTS Scores   Pre Score 54            MEDIFICTS Score Key: ?70 Need to make dietary changes  40-70 Heart Healthy Diet ? 40 Therapeutic Level Cholesterol Diet   Picture Your Plate  Scores: <14 Unhealthy dietary pattern with much room for improvement. 41-50 Dietary pattern unlikely to meet recommendations for good health and room for improvement. 51-60 More healthful dietary pattern, with some room for improvement.  >60 Healthy dietary pattern, although there may be some specific behaviors that could be improved.    Nutrition Goals Re-Evaluation:   Nutrition Goals Discharge (Final Nutrition Goals Re-Evaluation):   Psychosocial: Target Goals: Acknowledge presence or absence of significant depression and/or stress, maximize coping skills, provide positive support system. Participant is able to verbalize types and ability to use techniques and skills needed for reducing stress and depression.  Initial Review & Psychosocial Screening:  Initial Psych Review & Screening - 05/29/21 1312       Initial Review   Current issues with None Identified      Family Dynamics   Good Support System? Yes    Comments Her family is her support system.      Barriers   Psychosocial barriers to participate in program There are no identifiable barriers or psychosocial needs.      Screening Interventions   Interventions Encouraged to exercise    Expected Outcomes Short Term goal: Identification and review with participant of any Quality of Life or Depression concerns found by scoring the questionnaire.  Quality of Life Scores:  Scores of 19 and below usually indicate a poorer quality of life in these areas.  A difference of  2-3 points is a clinically meaningful difference.  A difference of 2-3 points in the total score of the Quality of Life Index has been associated with significant improvement in overall quality of life, self-image, physical symptoms, and general health in studies assessing change in quality of life.   PHQ-9: Recent Review Flowsheet Data     Depression screen [facility] 2/9 05/29/2021 03/23/2021 09/22/2020 09/21/2020 03/23/2020   Decreased Interest 0 0 0 0  0   Down, Depressed, Hopeless 0 0 0 0 0   PHQ - 2 Score 0 0 0 0 0   Altered sleeping 2 - - - -   Tired, decreased energy 0 - - - -   Change in appetite 0 - - - -   Feeling bad or failure about yourself  0 - - - -   Trouble concentrating 0 - - - -   Moving slowly or fidgety/restless 2 - - - -   Suicidal thoughts 0 - - - -   PHQ-9 Score 4 - - - -   Difficult doing work/chores Not difficult at all - - - -      Interpretation of Total Score  Total Score Depression Severity:  1-4 = Minimal depression, 5-9 = Mild depression, 10-14 = Moderate depression, 15-19 = Moderately severe depression, 20-27 = Severe depression   Psychosocial Evaluation and Intervention:  Psychosocial Evaluation - 05/29/21 1503       Psychosocial Evaluation & Interventions   Interventions Stress management education;Relaxation education;Encouraged to exercise with the program and follow exercise prescription    Comments Pt's only barrier to rehab is language. She speaks very little [place]. Her daughter, Brayton Layman, was her translator today. A translator will be here during all of her visits. She has no identifiable psychosocial issues. She scored a 4 on her PHQ-9, but reports that she copes well. Her support system is her family which includes her husband, her children, and her grand children. Her goals while in the program are to decrease her SOB with exertion. She previously went to the gym months ago, but has not been back in awhile. She is eager to start the program.    Expected Outcomes Pt will continue to have no identifiable psychosocial issue.    Continue Psychosocial Services  No Follow up required             Psychosocial Re-Evaluation:   Psychosocial Discharge (Final Psychosocial Re-Evaluation):    Education: Education Goals: Education classes will be provided on a weekly basis, covering required topics. Participant will state understanding/return demonstration of topics presented.  Learning  Barriers/Preferences:  Learning Barriers/Preferences - 05/29/21 1318       Learning Barriers/Preferences   Learning Barriers Language   Daughter is here to translate during orientation. Will need a translator for her other visits.   Learning Preferences Audio;Computer/Internet;Group Instruction;Individual Instruction;Pictoral;Skilled Demonstration;Verbal Instruction;Video;Written Material             Education Topics: How Lungs Work and Diseases: - Discuss the anatomy of the lungs and diseases that can affect the lungs, such as COPD.   Exercise: -Discuss the importance of exercise, FITT principles of exercise, normal and abnormal responses to exercise, and how to exercise safely.   Environmental Irritants: -Discuss types of environmental irritants and how to limit exposure to environmental irritants.   Meds/Inhalers and oxygen: - Discuss  respiratory medications, definition of an inhaler and oxygen, and the proper way to use an inhaler and oxygen.   Energy Saving Techniques: - Discuss methods to conserve energy and decrease shortness of breath when performing activities of daily living.    Bronchial Hygiene / Breathing Techniques: - Discuss breathing mechanics, pursed-lip breathing technique,  proper posture, effective ways to clear airways, and other functional breathing techniques   Cleaning Equipment: - Provides group verbal and written instruction about the health risks of elevated stress, cause of high stress, and healthy ways to reduce stress.   Nutrition I: Fats: - Discuss the types of cholesterol, what cholesterol does to the body, and how cholesterol levels can be controlled.   Nutrition II: Labels: -Discuss the different components of food labels and how to read food labels.   Respiratory Infections: - Discuss the signs and symptoms of respiratory infections, ways to prevent respiratory infections, and the importance of seeking medical treatment when  having a respiratory infection.   Stress I: Signs and Symptoms: - Discuss the causes of stress, how stress may lead to anxiety and depression, and ways to limit stress.   Stress II: Relaxation: -Discuss relaxation techniques to limit stress.   Oxygen for Home/Travel: - Discuss how to prepare for travel when on oxygen and proper ways to transport and store oxygen to ensure safety.   Knowledge Questionnaire Score:  Knowledge Questionnaire Score - 05/29/21 1323       Knowledge Questionnaire Score   Pre Score 14/18             Core Components/Risk Factors/Patient Goals at Admission:  Personal Goals and Risk Factors at Admission - 05/29/21 1352       Core Components/Risk Factors/Patient Goals on Admission   Improve shortness of breath with ADL's Yes    Intervention Provide education, individualized exercise plan and daily activity instruction to help decrease symptoms of SOB with activities of daily living.    Expected Outcomes Short Term: Improve cardiorespiratory fitness to achieve a reduction of symptoms when performing ADLs;Long Term: Be able to perform more ADLs without symptoms or delay the onset of symptoms             Core Components/Risk Factors/Patient Goals Review:    Core Components/Risk Factors/Patient Goals at Discharge (Final Review):    ITP Comments:   Comments: Patient arrived for 1st visit/orientation/education at 1230. Her daughter, Brayton Layman, is here as her Optometrist. Patient was referred to PR by Dr. Chase Caller due to Interstitial Lung Disease (J84.9). During orientation advised patient on arrival and appointment times what to wear, what to do before, during and after exercise. Reviewed attendance and class policy.  Pt is scheduled to return Pulmonary Rehab on 05/31/2021 at 1330. Pt was advised to come to class 15 minutes before class starts.  Discussed RPE/Dpysnea scales. Patient participated in warm up stretches. Patient was able to complete 6 minute  walk test. Patient was measured for the equipment. Discussed equipment safety with patient. Took patient pre-anthropometric measurements. Patient finished visit at 1415.

## 2021-05-31 ENCOUNTER — Encounter (HOSPITAL_COMMUNITY)
Admission: RE | Admit: 2021-05-31 | Discharge: 2021-05-31 | Disposition: A | Discharge status: Home / Self Care | Payer: Medicare Other | Source: Ambulatory Visit | Attending: Internal Medicine | Admitting: Internal Medicine

## 2021-05-31 DIAGNOSIS — J849 Interstitial pulmonary disease, unspecified: Secondary | ICD-10-CM

## 2021-05-31 NOTE — Progress Notes (Signed)
Daily Session Note  Patient Details  Name: Tasha [name] MRN: [id number] Date of Birth: [birth year] Referring Provider:   Flowsheet Row PULMONARY REHAB OTHER RESP ORIENTATION from 05/29/2021 in [place]  Referring Provider Dr. Chase Caller       Encounter Date: 05/31/2021  Check In:  Session Check In - 05/31/21 1330       Check-In   Supervising physician immediately available to respond to emergencies [facility] MD immediately available    Physician(s) Dr. Johnsie Cancel    Location [facility]    Staff Present Geanie Cooley, RN;Dalton Kris Mouton, MS, ACSM-CEP, Exercise Physiologist;Heather Zigmund Daniel, Exercise Physiologist    Virtual Visit No    Medication changes reported     No    Fall or balance concerns reported    No    Tobacco Cessation No Change    Warm-up and Cool-down Performed as group-led instruction    Resistance Training Performed Yes    VAD Patient? No    PAD/SET Patient? No      Pain Assessment   Currently in Pain? No/denies    Multiple Pain Sites No             Capillary Blood Glucose: No results found for this or any previous visit (from the past 24 hour(s)).    Social History   Tobacco Use  Smoking Status Former  Smokeless Tobacco Never    Goals Met:  Exercise tolerated well No report of concerns or symptoms today Strength training completed today  Goals Unmet:  Not Applicable  Comments: check out @ 14:30pm   Dr. Kathie Dike is Medical Director for [facility] Pul[facility].

## 2021-06-05 ENCOUNTER — Encounter (HOSPITAL_COMMUNITY)
Admission: RE | Admit: 2021-06-05 | Discharge: 2021-06-05 | Disposition: A | Discharge status: Home / Self Care | Payer: Medicare Other | Source: Ambulatory Visit | Attending: Internal Medicine | Admitting: Internal Medicine

## 2021-06-05 VITALS — Wt 140.4 lb

## 2021-06-05 DIAGNOSIS — J849 Interstitial pulmonary disease, unspecified: Secondary | ICD-10-CM | POA: Diagnosis not present

## 2021-06-05 NOTE — Progress Notes (Signed)
Daily Session Note  Patient Details  Name: Tasha [name] MRN: [id number] Date of Birth: 12-02-1950 Referring Provider:   Flowsheet Row PULMONARY REHAB OTHER RESP ORIENTATION from 05/29/2021 in [place]  Referring Provider Dr. Chase Caller       Encounter Date: 06/05/2021  Check In:  Session Check In - 06/05/21 1330       Check-In   Supervising physician immediately available to respond to emergencies [facility] MD immediately available    Physician(s) Dr. Harl Bowie    Location [facility]    Staff Present Geanie Cooley, RN;Dalton Kris Mouton, MS, ACSM-CEP, Exercise Physiologist;Heather Zigmund Daniel, Exercise Physiologist    Virtual Visit No    Medication changes reported     No    Fall or balance concerns reported    No    Tobacco Cessation No Change    Warm-up and Cool-down Performed as group-led instruction    Resistance Training Performed Yes    VAD Patient? No    PAD/SET Patient? No      Pain Assessment   Currently in Pain? No/denies    Multiple Pain Sites No             Capillary Blood Glucose: No results found for this or any previous visit (from the past 24 hour(s)).    Social History   Tobacco Use  Smoking Status Former  Smokeless Tobacco Never    Goals Met:  Proper associated with RPD/PD & O2 Sat Exercise tolerated well No report of concerns or symptoms today Strength training completed today  Goals Unmet:  Not Applicable  Comments: check out @ 2:30pm   Dr. Kathie Dike is Medical Director for [facility].

## 2021-06-07 ENCOUNTER — Encounter (HOSPITAL_COMMUNITY)
Admission: RE | Admit: 2021-06-07 | Discharge: 2021-06-07 | Disposition: A | Discharge status: Home / Self Care | Payer: Medicare Other | Source: Ambulatory Visit | Attending: Internal Medicine | Admitting: Internal Medicine

## 2021-06-07 DIAGNOSIS — J849 Interstitial pulmonary disease, unspecified: Secondary | ICD-10-CM | POA: Diagnosis not present

## 2021-06-07 NOTE — Progress Notes (Signed)
Daily Session Note  Patient Details  Name: Tasha [name] MRN: [id number] Date of Birth: 08-10-1950 Referring Provider:   Flowsheet Row PULMONARY REHAB OTHER RESP ORIENTATION from 05/29/2021 in [place]  Referring Provider Dr. Chase Caller       Encounter Date: 06/07/2021  Check In:  Session Check In - 06/07/21 1330       Check-In   Supervising physician immediately available to respond to emergencies [facility] MD immediately available    Physician(s) Dr. Harl Bowie    Location [facility]    Staff Present Geanie Cooley, RN;Dalton Kris Mouton, MS, ACSM-CEP, Exercise Physiologist;Heather Zigmund Daniel, Exercise Physiologist    Virtual Visit No    Medication changes reported     No    Fall or balance concerns reported    No    Tobacco Cessation No Change    Warm-up and Cool-down Performed as group-led instruction    Resistance Training Performed Yes    VAD Patient? No    PAD/SET Patient? No      Pain Assessment   Currently in Pain? No/denies    Multiple Pain Sites No             Capillary Blood Glucose: No results found for this or any previous visit (from the past 24 hour(s)).    Social History   Tobacco Use  Smoking Status Former  Smokeless Tobacco Never    Goals Met:  Exercise tolerated well No report of concerns or symptoms today Strength training completed today  Goals Unmet:  Not Applicable  Comments: checkout @ 2:30pm   Dr. Kathie Dike is Medical Director for [facility].

## 2021-06-12 ENCOUNTER — Encounter (HOSPITAL_COMMUNITY)
Admission: RE | Admit: 2021-06-12 | Discharge: 2021-06-12 | Disposition: A | Discharge status: Home / Self Care | Payer: Medicare Other | Source: Ambulatory Visit | Attending: Internal Medicine | Admitting: Internal Medicine

## 2021-06-12 DIAGNOSIS — J849 Interstitial pulmonary disease, unspecified: Secondary | ICD-10-CM

## 2021-06-12 NOTE — Progress Notes (Signed)
Daily Session Note  Patient Details  Name: Tasha [name] MRN: [id number] Date of Birth: April 21, 1951 Referring Provider:   Flowsheet Row PULMONARY REHAB OTHER RESP ORIENTATION from 05/29/2021 in [place]  Referring Provider Dr. Chase Caller       Encounter Date: 06/12/2021  Check In:  Session Check In - 06/12/21 1330       Check-In   Supervising physician immediately available to respond to emergencies [facility] immediately available    Physician(s) Dr. Debara Pickett    Location [facility]    Staff Present Geanie Cooley, RN;Dalton Kris Mouton, MS, ACSM-CEP, Exercise Physiologist    Virtual Visit No    Medication changes reported     No    Fall or balance concerns reported    No    Tobacco Cessation No Change    Warm-up and Cool-down Performed as group-led instruction    Resistance Training Performed Yes    VAD Patient? No    PAD/SET Patient? No      Pain Assessment   Currently in Pain? No/denies    Multiple Pain Sites No             Capillary Blood Glucose: No results found for this or any previous visit (from the past 24 hour(s)).    Social History   Tobacco Use  Smoking Status Former  Smokeless Tobacco Never    Goals Met:  Independence with exercise equipment Exercise tolerated well No report of concerns or symptoms today Strength training completed today  Goals Unmet:  Not Applicable  Comments: check out @ 2:30pm   Dr. Kathie Dike is Medical Director for [facility] Pul[facility].

## 2021-06-14 ENCOUNTER — Encounter (HOSPITAL_COMMUNITY)
Admission: RE | Admit: 2021-06-14 | Discharge: 2021-06-14 | Disposition: A | Discharge status: Home / Self Care | Payer: Medicare Other | Source: Ambulatory Visit | Attending: Internal Medicine | Admitting: Internal Medicine

## 2021-06-14 DIAGNOSIS — J849 Interstitial pulmonary disease, unspecified: Secondary | ICD-10-CM

## 2021-06-14 NOTE — Progress Notes (Signed)
Daily Session Note  Patient Details  Name: Tasha [name] MRN: [id number] Date of Birth: July 12, 1950 Referring Provider:   Flowsheet Row PULMONARY REHAB OTHER RESP ORIENTATION from 05/29/2021 in [place]  Referring Provider Dr. Chase Caller       Encounter Date: 06/14/2021  Check In:  Session Check In - 06/14/21 1327       Check-In   Supervising physician immediately available to respond to emergencies [facility] MD immediately available    Physician(s) Dr. Domenic Polite    Location [facility]    Staff Present Redge Gainer, BS, Exercise Physiologist;Varina Hulon Kris Mouton, MS, ACSM-CEP, Exercise Physiologist;Other    Virtual Visit No    Medication changes reported     No    Fall or balance concerns reported    No    Tobacco Cessation No Change    Warm-up and Cool-down Performed as group-led instruction    Resistance Training Performed Yes    VAD Patient? No    PAD/SET Patient? No      Pain Assessment   Currently in Pain? No/denies    Multiple Pain Sites No             Capillary Blood Glucose: No results found for this or any previous visit (from the past 24 hour(s)).    Social History   Tobacco Use  Smoking Status Former  Smokeless Tobacco Never    Goals Met:  Independence with exercise equipment Exercise tolerated well No report of concerns or symptoms today Strength training completed today  Goals Unmet:  Not Applicable  Comments: checkout time is 1430   Dr. Kathie Dike is Medical Director for [facility].

## 2021-06-18 ENCOUNTER — Other Ambulatory Visit: Payer: Self-pay

## 2021-06-18 DIAGNOSIS — R7303 Prediabetes: Secondary | ICD-10-CM

## 2021-06-18 MED ORDER — LOSARTAN POTASSIUM 50 MG PO TABS: 50.0000 mg | ORAL_TABLET | Freq: Every day | ORAL | 3 refills | Status: DC

## 2021-06-18 NOTE — Telephone Encounter (Signed)
This is a [place] pt.  °

## 2021-06-19 ENCOUNTER — Encounter (HOSPITAL_COMMUNITY)
Admission: RE | Admit: 2021-06-19 | Discharge: 2021-06-19 | Disposition: A | Discharge status: Home / Self Care | Payer: Medicare Other | Source: Ambulatory Visit | Attending: Internal Medicine | Admitting: Internal Medicine

## 2021-06-19 VITALS — Wt 138.7 lb

## 2021-06-19 DIAGNOSIS — J849 Interstitial pulmonary disease, unspecified: Secondary | ICD-10-CM

## 2021-06-19 NOTE — Progress Notes (Signed)
Daily Session Note  Patient Details  Name: Tasha [name] MRN: [id number] Date of Birth: 03/20/1951 Referring Provider:   Flowsheet Row PULMONARY REHAB OTHER RESP ORIENTATION from 05/29/2021 in [place]  Referring Provider Dr. Chase Caller       Encounter Date: 06/19/2021  Check In:  Session Check In - 06/19/21 1329       Check-In   Supervising physician immediately available to respond to emergencies [facility] MD immediately available    Physician(s) Dr. Harl Bowie    Location [facility]    Staff Present Geanie Cooley, RN;Dalton Kris Mouton, MS, ACSM-CEP, Exercise Physiologist;Heather Zigmund Daniel, Exercise Physiologist    Virtual Visit No    Medication changes reported     No    Fall or balance concerns reported    No    Tobacco Cessation No Change    Warm-up and Cool-down Performed as group-led instruction    Resistance Training Performed Yes    VAD Patient? No    PAD/SET Patient? No      Pain Assessment   Currently in Pain? No/denies    Multiple Pain Sites No             Capillary Blood Glucose: No results found for this or any previous visit (from the past 24 hour(s)).    Social History   Tobacco Use  Smoking Status Former  Smokeless Tobacco Never    Goals Met:  Exercise tolerated well No report of concerns or symptoms today Strength training completed today  Goals Unmet:  Not Applicable  Comments: check out @ 2:45pm   Dr. Kathie Dike is Medical Director for [facility].

## 2021-06-20 NOTE — Progress Notes (Signed)
Pulmonary Individual Treatment Plan  Patient Details  Name: Tasha [name] MRN: [id number] Date of Birth: Dec 10, 1950 Referring Provider:   Flowsheet Row PULMONARY REHAB OTHER RESP ORIENTATION from 05/29/2021 in [facility]  Referring Provider Dr. Chase Caller       Initial Encounter Date:  Flowsheet Row PULMONARY REHAB OTHER RESP ORIENTATION from 05/29/2021 in [facility]  Date 05/29/21       Visit Diagnosis: Interstitial lung disease ([place])  Patient's Home Medications on Admission:   Current Outpatient Medications:    acetaminophen (TYLENOL) 500 MG tablet, Take 1 tablet (500 mg total) by mouth every 6 (six) hours as needed., Disp: 30 tablet, Rfl: 0   aspirin EC 81 MG tablet, Take 1 tablet (81 mg total) by mouth daily., Disp: 90 tablet, Rfl: 3   atorvastatin (LIPITOR) 20 MG tablet, Take 1 tablet (20 mg total) by mouth daily at 6 PM., Disp: 90 tablet, Rfl: 3   budesonide (PULMICORT) 180 MCG/ACT inhaler, Inhale 2 puffs into the lungs in the morning and at bedtime., Disp: 1 each, Rfl: 5   cetirizine (ZYRTEC) 10 MG tablet, Take 1 tablet (10 mg total) by mouth daily as needed for allergies., Disp: 90 tablet, Rfl: 3   diltiazem (CARDIZEM CD) 120 MG 24 hr capsule, Take 120 mg by mouth daily., Disp: , Rfl:    guaiFENesin (MUCINEX) 600 MG 12 hr tablet, Take 1 tablet (600 mg total) by mouth 2 (two) times daily. (Patient not taking: Reported on 05/29/2021), Disp: 60 tablet, Rfl: 0   hydrochlorothiazide (HYDRODIURIL) 25 MG tablet, Take 1 tablet (25 mg total) by mouth daily., Disp: 90 tablet, Rfl: 3   ibuprofen (ADVIL) 200 MG tablet, Take 400 mg by mouth every 6 (six) hours as needed for headache or moderate pain., Disp: , Rfl:    Ipratropium-Albuterol (COMBIVENT RESPIMAT) 20-100 MCG/ACT AERS respimat, Inhale 1 puff into the lungs every 6 (six) hours as needed for wheezing., Disp: 4 g, Rfl: 5   levothyroxine (SYNTHROID) 50 MCG tablet, Take 1 tablet (50 mcg total)  by mouth daily before breakfast., Disp: 90 tablet, Rfl: 3   losartan (COZAAR) 50 MG tablet, Take 1 tablet (50 mg total) by mouth daily., Disp: 90 tablet, Rfl: 3   Omega 3 1000 MG CAPS, Take 1,000 mg by mouth daily. , Disp: , Rfl:    ondansetron (ZOFRAN-ODT) 8 MG disintegrating tablet, Take 1 tablet (8 mg total) by mouth every 6 (six) hours as needed for nausea or vomiting. (Patient not taking: Reported on 05/29/2021), Disp: 20 tablet, Rfl: 1   pantoprazole (PROTONIX) 40 MG tablet, Take 1 tablet (40 mg total) by mouth daily., Disp: 90 tablet, Rfl: 3   potassium chloride (KLOR-CON) 20 MEQ tablet, Take 1 tablet (20 mEq total) by mouth daily., Disp: 90 tablet, Rfl: 3  Past Medical History: Past Medical History:  Diagnosis Date   Acute respiratory failure ([place])    CAP (community acquired pneumonia)    Hyperlipidemia    Hypertension    Metabolic syndrome     Tobacco Use: Social History   Tobacco Use  Smoking Status Former  Smokeless Tobacco Never    Labs: Recent Review Scientist, physiological     Labs for ITP Cardiac and Pulmonary Rehab Latest Ref Rng & Units 04/14/2019 07/14/2019 10/18/2019 03/23/2020 03/23/2021   Cholestrol 100 - 199 mg/dL 116 - 117 135 125   LDLCALC 0 - 99 mg/dL 60 - 51 71 66   LDLDIRECT 0 - 99 mg/dL - - - - -  HDL >39 mg/dL 37(L) - 36(L) 45 41   Trlycerides 0 - 149 mg/dL 101 - 177(H) 105 94   Hemoglobin A1c 4.8 - 5.6 % 5.6 5.3 5.6 5.3 5.1   PHART 7.350 - 7.450 - - - - -   PCO2ART 32.0 - 48.0 mmHg - - - - -   HCO3 20.0 - 28.0 mmol/L - - - - -   TCO2 22 - 32 mmol/L - - - - -   O2SAT % - - - - -       Capillary Blood Glucose: Lab Results  Component Value Date   GLUCAP 165 (H) 01/09/2018   GLUCAP 142 (H) 01/08/2018   GLUCAP 139 (H) 01/08/2018   GLUCAP 117 (H) 01/08/2018   GLUCAP 85 01/08/2018     Pulmonary Assessment Scores:  Pulmonary Assessment Scores     Row Name 05/29/21 1305         ADL UCSD   ADL Phase Entry     SOB Score total 13     Rest 0      Walk 1     Stairs 4     Bath 0     Dress 0     Shop 0       CAT Score   CAT Score 12       mMRC Score   mMRC Score 2             UCSD: Self-administered rating of dyspnea associated with activities of daily living (ADLs) 6-point scale (0 = "not at all" to 5 = "maximal or unable to do because of breathlessness")  Scoring Scores range from 0 to 120.  Minimally important difference is 5 units  CAT: CAT can identify the health impairment of COPD patients and is better correlated with disease progression.  CAT has a scoring range of zero to 40. The CAT score is classified into four groups of low (less than 10), medium (10 - 20), high (21-30) and very high (31-40) based on the impact level of disease on health status. A CAT score over 10 suggests significant symptoms.  A worsening CAT score could be explained by an exacerbation, poor medication adherence, poor inhaler technique, or progression of COPD or comorbid conditions.  CAT MCID is 2 points  mMRC: mMRC (Modified Medical Research Council) Dyspnea Scale is used to assess the degree of baseline functional disability in patients of respiratory disease due to dyspnea. No minimal important difference is established. A decrease in score of 1 point or greater is considered a positive change.   Pulmonary Function Assessment:   Exercise Target Goals: Exercise Program Goal: Individual exercise prescription set using results from initial 6 min walk test and THRR while considering  patients activity barriers and safety.   Exercise Prescription Goal: Initial exercise prescription builds to 30-45 minutes a day of aerobic activity, 2-3 days per week.  Home exercise guidelines will be given to patient during program as part of exercise prescription that the participant will acknowledge.  Activity Barriers & Risk Stratification:  Activity Barriers & Cardiac Risk Stratification - 05/29/21 1304       Activity Barriers & Cardiac Risk  Stratification   Activity Barriers Shortness of Breath    Cardiac Risk Stratification Moderate             6 Minute Walk:  6 Minute Walk     Row Name 05/29/21 1456         6 Minute Walk  Phase Initial     Distance 1150 feet     Walk Time 6 minutes     # of Rest Breaks 0     MPH 2.18     METS 2.74     RPE 10     Perceived Dyspnea  10     VO2 Peak 9.58     Symptoms No     Resting HR 88 bpm     Resting BP 138/82     Resting Oxygen Saturation  95 %     Exercise Oxygen Saturation  during 6 min walk 93 %     Max Ex. HR 111 bpm     Max Ex. BP 140/84     2 Minute Post BP 130/78       Interval HR   1 Minute HR 105     2 Minute HR 110     3 Minute HR 111     4 Minute HR 108     5 Minute HR 111     6 Minute HR 110     2 Minute Post HR 92     Interval Heart Rate? Yes       Interval Oxygen   Interval Oxygen? Yes     Baseline Oxygen Saturation % 95 %     1 Minute Oxygen Saturation % 93 %     1 Minute Liters of Oxygen 0 L     2 Minute Oxygen Saturation % 94 %     2 Minute Liters of Oxygen 0 L     3 Minute Oxygen Saturation % 93 %     3 Minute Liters of Oxygen 0 L     4 Minute Oxygen Saturation % 93 %     4 Minute Liters of Oxygen 0 L     5 Minute Oxygen Saturation % 93 %     5 Minute Liters of Oxygen 0 L     6 Minute Oxygen Saturation % 93 %     6 Minute Liters of Oxygen 0 L     2 Minute Post Oxygen Saturation % 96 %     2 Minute Post Liters of Oxygen 0 L              Oxygen Initial Assessment:  Oxygen Initial Assessment - 05/29/21 1502       Initial 6 min Walk   Oxygen Used None      Program Oxygen Prescription   Program Oxygen Prescription None      Intervention   Short Term Goals To learn and understand importance of monitoring SPO2 with pulse oximeter and demonstrate accurate use of the pulse oximeter.;To learn and demonstrate proper pursed lip breathing techniques or other breathing techniques. ;To learn and understand importance of maintaining  oxygen saturations>88%    Long  Term Goals Compliance with respiratory medication;Exhibits proper breathing techniques, such as pursed lip breathing or other method taught during program session;Maintenance of O2 saturations>88%;Verbalizes importance of monitoring SPO2 with pulse oximeter and return demonstration             Oxygen Re-Evaluation:  Oxygen Re-Evaluation     Row Name 06/19/21 1453             Program Oxygen Prescription   Program Oxygen Prescription None         Home Oxygen   Home Oxygen Device None       Sleep Oxygen Prescription None  Home Exercise Oxygen Prescription None       Home Resting Oxygen Prescription None       Compliance with Home Oxygen Use Yes         Goals/Expected Outcomes   Short Term Goals To learn and understand importance of monitoring SPO2 with pulse oximeter and demonstrate accurate use of the pulse oximeter.;To learn and demonstrate proper pursed lip breathing techniques or other breathing techniques. ;To learn and understand importance of maintaining oxygen saturations>88%       Long  Term Goals Compliance with respiratory medication;Exhibits proper breathing techniques, such as pursed lip breathing or other method taught during program session;Maintenance of O2 saturations>88%;Verbalizes importance of monitoring SPO2 with pulse oximeter and return demonstration       Goals/Expected Outcomes compliance                Oxygen Discharge (Final Oxygen Re-Evaluation):  Oxygen Re-Evaluation - 06/19/21 1453       Program Oxygen Prescription   Program Oxygen Prescription None      Home Oxygen   Home Oxygen Device None    Sleep Oxygen Prescription None    Home Exercise Oxygen Prescription None    Home Resting Oxygen Prescription None    Compliance with Home Oxygen Use Yes      Goals/Expected Outcomes   Short Term Goals To learn and understand importance of monitoring SPO2 with pulse oximeter and demonstrate accurate use of the  pulse oximeter.;To learn and demonstrate proper pursed lip breathing techniques or other breathing techniques. ;To learn and understand importance of maintaining oxygen saturations>88%    Long  Term Goals Compliance with respiratory medication;Exhibits proper breathing techniques, such as pursed lip breathing or other method taught during program session;Maintenance of O2 saturations>88%;Verbalizes importance of monitoring SPO2 with pulse oximeter and return demonstration    Goals/Expected Outcomes compliance             Initial Exercise Prescription:  Initial Exercise Prescription - 05/29/21 1400       Date of Initial Exercise RX and Referring Provider   Date 05/29/21    Referring Provider Dr. Chase Caller    Expected Discharge Date 10/02/21      Treadmill   MPH 2    Grade 0    Minutes 17      NuStep   Level 1    SPM 80    Minutes 22      Prescription Details   Frequency (times per week) 2    Duration Progress to 30 minutes of continuous aerobic without signs/symptoms of physical distress      Intensity   THRR 40-80% of Max Heartrate 60-120    Ratings of Perceived Exertion 11-13    Perceived Dyspnea 0-4      Resistance Training   Training Prescription Yes    Weight 3 lbs    Reps 10-15             Perform Capillary Blood Glucose checks as needed.  Exercise Prescription Changes:   Exercise Prescription Changes     Row Name 06/05/21 1400 06/19/21 1400           Response to Exercise   Blood Pressure (Admit) 152/78 112/60      Blood Pressure (Exercise) 142/80 138/70      Blood Pressure (Exit) 134/62 110/60      Heart Rate (Admit) 90 bpm 85 bpm      Heart Rate (Exercise) 111 bpm 116 bpm  Heart Rate (Exit) 96 bpm 95 bpm      Oxygen Saturation (Admit) 94 % 95 %      Oxygen Saturation (Exercise) 94 % 92 %      Oxygen Saturation (Exit) 95 % 96 %      Rating of Perceived Exertion (Exercise) 10 12      Perceived Dyspnea (Exercise) 10 13      Duration  Continue with 30 min of aerobic exercise without signs/symptoms of physical distress. Continue with 30 min of aerobic exercise without signs/symptoms of physical distress.      Intensity THRR unchanged THRR unchanged        Progression   Progression Continue to progress workloads to maintain intensity without signs/symptoms of physical distress. Continue to progress workloads to maintain intensity without signs/symptoms of physical distress.        Resistance Training   Training Prescription Yes Yes      Weight 3 3      Reps 10-15 10-15      Time 10 Minutes 10 Minutes        Treadmill   MPH 2.5 2.9      Grade 0 0      Minutes 17 17      METs 2.91 3.22        NuStep   Level 1 2      SPM 83 107      Minutes 22 22      METs 2 2.4               Exercise Comments:   Exercise Goals and Review:   Exercise Goals     Row Name 05/29/21 1501 06/19/21 1454           Exercise Goals   Increase Physical Activity Yes Yes      Intervention Provide advice, education, support and counseling about physical activity/exercise needs.;Develop an individualized exercise prescription for aerobic and resistive training based on initial evaluation findings, risk stratification, comorbidities and participant's personal goals. Provide advice, education, support and counseling about physical activity/exercise needs.;Develop an individualized exercise prescription for aerobic and resistive training based on initial evaluation findings, risk stratification, comorbidities and participant's personal goals.      Expected Outcomes Short Term: Attend rehab on a regular basis to increase amount of physical activity.;Long Term: Exercising regularly at least 3-5 days a week.;Long Term: Add in home exercise to make exercise part of routine and to increase amount of physical activity. Short Term: Attend rehab on a regular basis to increase amount of physical activity.;Long Term: Exercising regularly at least 3-5  days a week.;Long Term: Add in home exercise to make exercise part of routine and to increase amount of physical activity.      Increase Strength and Stamina Yes Yes      Intervention Provide advice, education, support and counseling about physical activity/exercise needs.;Develop an individualized exercise prescription for aerobic and resistive training based on initial evaluation findings, risk stratification, comorbidities and participant's personal goals. Provide advice, education, support and counseling about physical activity/exercise needs.;Develop an individualized exercise prescription for aerobic and resistive training based on initial evaluation findings, risk stratification, comorbidities and participant's personal goals.      Expected Outcomes Short Term: Perform resistance training exercises routinely during rehab and add in resistance training at home;Short Term: Increase workloads from initial exercise prescription for resistance, speed, and METs.;Long Term: Improve cardiorespiratory fitness, muscular endurance and strength as measured by increased METs and functional capacity (6MWT)  Short Term: Perform resistance training exercises routinely during rehab and add in resistance training at home;Short Term: Increase workloads from initial exercise prescription for resistance, speed, and METs.;Long Term: Improve cardiorespiratory fitness, muscular endurance and strength as measured by increased METs and functional capacity (6MWT)      Able to understand and use rate of perceived exertion (RPE) scale Yes Yes      Intervention Provide education and explanation on how to use RPE scale Provide education and explanation on how to use RPE scale      Expected Outcomes Long Term:  Able to use RPE to guide intensity level when exercising independently;Short Term: Able to use RPE daily in rehab to express subjective intensity level Long Term:  Able to use RPE to guide intensity level when exercising  independently;Short Term: Able to use RPE daily in rehab to express subjective intensity level      Able to understand and use Dyspnea scale Yes Yes      Intervention Provide education and explanation on how to use Dyspnea scale Provide education and explanation on how to use Dyspnea scale      Expected Outcomes Short Term: Able to use Dyspnea scale daily in rehab to express subjective sense of shortness of breath during exertion;Long Term: Able to use Dyspnea scale to guide intensity level when exercising independently Short Term: Able to use Dyspnea scale daily in rehab to express subjective sense of shortness of breath during exertion;Long Term: Able to use Dyspnea scale to guide intensity level when exercising independently      Knowledge and understanding of Target Heart Rate Range (THRR) Yes Yes      Intervention Provide education and explanation of THRR including how the numbers were predicted and where they are located for reference Provide education and explanation of THRR including how the numbers were predicted and where they are located for reference      Expected Outcomes Short Term: Able to state/look up THRR;Long Term: Able to use THRR to govern intensity when exercising independently;Short Term: Able to use daily as guideline for intensity in rehab Short Term: Able to state/look up THRR;Long Term: Able to use THRR to govern intensity when exercising independently;Short Term: Able to use daily as guideline for intensity in rehab      Understanding of Exercise Prescription Yes Yes      Intervention Provide education, explanation, and written materials on patient's individual exercise prescription Provide education, explanation, and written materials on patient's individual exercise prescription      Expected Outcomes Short Term: Able to explain program exercise prescription;Long Term: Able to explain home exercise prescription to exercise independently Short Term: Able to explain program  exercise prescription;Long Term: Able to explain home exercise prescription to exercise independently               Exercise Goals Re-Evaluation :  Exercise Goals Re-Evaluation     Row Name 06/19/21 1455             Exercise Goal Re-Evaluation   Exercise Goals Review Increase Physical Activity;Increase Strength and Stamina;Able to understand and use rate of perceived exertion (RPE) scale;Able to understand and use Dyspnea scale;Knowledge and understanding of Target Heart Rate Range (THRR);Understanding of Exercise Prescription       Comments Pt has attended 6 exercise sessions. She reports going to the gym already before starting the program, but her daughter reports that she does not push herself hard enough. She has been able to progress her workloads and  is eager to improve her functioning. She is currently exercising at 3.22 METs. Will continue to monitor and progress as able.       Expected Outcomes Through exercise at rehab and at home, the patient will meet their stated goals.                Discharge Exercise Prescription (Final Exercise Prescription Changes):  Exercise Prescription Changes - 06/19/21 1400       Response to Exercise   Blood Pressure (Admit) 112/60    Blood Pressure (Exercise) 138/70    Blood Pressure (Exit) 110/60    Heart Rate (Admit) 85 bpm    Heart Rate (Exercise) 116 bpm    Heart Rate (Exit) 95 bpm    Oxygen Saturation (Admit) 95 %    Oxygen Saturation (Exercise) 92 %    Oxygen Saturation (Exit) 96 %    Rating of Perceived Exertion (Exercise) 12    Perceived Dyspnea (Exercise) 13    Duration Continue with 30 min of aerobic exercise without signs/symptoms of physical distress.    Intensity THRR unchanged      Progression   Progression Continue to progress workloads to maintain intensity without signs/symptoms of physical distress.      Resistance Training   Training Prescription Yes    Weight 3    Reps 10-15    Time 10 Minutes       Treadmill   MPH 2.9    Grade 0    Minutes 17    METs 3.22      NuStep   Level 2    SPM 107    Minutes 22    METs 2.4             Nutrition:  Target Goals: Understanding of nutrition guidelines, daily intake of sodium 1500mg , cholesterol 200mg , calories 30% from fat and 7% or less from saturated fats, daily to have 5 or more servings of fruits and vegetables.  Biometrics:  Pre Biometrics - 05/29/21 1501       Pre Biometrics   Height 5\' 1"  (1.549 m)    Weight 64.2 kg    Waist Circumference 35 inches    Hip Circumference 39.5 inches    Waist to Hip Ratio 0.89 %    BMI (Calculated) 26.76    Triceps Skinfold 35 mm    % Body Fat 40.4 %    Grip Strength 22.7 kg    Flexibility 0 in    Single Leg Stand 25.15 seconds              Nutrition Therapy Plan and Nutrition Goals:  Nutrition Therapy & Goals - 06/12/21 1012       Personal Nutrition Goals   Comments Patient is new to the program, she scored 54 on the diet assessment.  We offer 2 educational sessions on heart healthy nutrition with handouts and offer assistance with RD referral if patinet is interested.  Will evaluate the need for spanish information .      Intervention Plan   Intervention Nutrition handout(s) given to patient.    Expected Outcomes Short Term Goal: Understand basic principles of dietary content, such as calories, fat, sodium, cholesterol and nutrients.             Nutrition Assessments:  Nutrition Assessments - 05/29/21 1321       MEDFICTS Scores   Pre Score 54            MEDIFICTS Score Key: ?70 Need to  make dietary changes  40-70 Heart Healthy Diet ? 40 Therapeutic Level Cholesterol Diet   Picture Your Plate Scores: <54 Unhealthy dietary pattern with much room for improvement. 41-50 Dietary pattern unlikely to meet recommendations for good health and room for improvement. 51-60 More healthful dietary pattern, with some room for improvement.  >60 Healthy dietary  pattern, although there may be some specific behaviors that could be improved.    Nutrition Goals Re-Evaluation:   Nutrition Goals Discharge (Final Nutrition Goals Re-Evaluation):   Psychosocial: Target Goals: Acknowledge presence or absence of significant depression and/or stress, maximize coping skills, provide positive support system. Participant is able to verbalize types and ability to use techniques and skills needed for reducing stress and depression.  Initial Review & Psychosocial Screening:  Initial Psych Review & Screening - 05/29/21 1312       Initial Review   Current issues with None Identified      Family Dynamics   Good Support System? Yes    Comments Her family is her support system.      Barriers   Psychosocial barriers to participate in program There are no identifiable barriers or psychosocial needs.      Screening Interventions   Interventions Encouraged to exercise    Expected Outcomes Short Term goal: Identification and review with participant of any Quality of Life or Depression concerns found by scoring the questionnaire.             Quality of Life Scores:  Scores of 19 and below usually indicate a poorer quality of life in these areas.  A difference of  2-3 points is a clinically meaningful difference.  A difference of 2-3 points in the total score of the Quality of Life Index has been associated with significant improvement in overall quality of life, self-image, physical symptoms, and general health in studies assessing change in quality of life.   PHQ-9: Recent Review Flowsheet Data     Depression screen [facility] 2/9 05/29/2021 03/23/2021 09/22/2020 09/21/2020 03/23/2020   Decreased Interest 0 0 0 0 0   Down, Depressed, Hopeless 0 0 0 0 0   PHQ - 2 Score 0 0 0 0 0   Altered sleeping 2 - - - -   Tired, decreased energy 0 - - - -   Change in appetite 0 - - - -   Feeling bad or failure about yourself  0 - - - -   Trouble concentrating 0 - - - -    Moving slowly or fidgety/restless 2 - - - -   Suicidal thoughts 0 - - - -   PHQ-9 Score 4 - - - -   Difficult doing work/chores Not difficult at all - - - -      Interpretation of Total Score  Total Score Depression Severity:  1-4 = Minimal depression, 5-9 = Mild depression, 10-14 = Moderate depression, 15-19 = Moderately severe depression, 20-27 = Severe depression   Psychosocial Evaluation and Intervention:  Psychosocial Evaluation - 05/29/21 1503       Psychosocial Evaluation & Interventions   Interventions Stress management education;Relaxation education;Encouraged to exercise with the program and follow exercise prescription    Comments Pt's only barrier to rehab is language. She speaks very little [place]. Her daughter, Brayton Layman, was her translator today. A translator will be here during all of her visits. She has no identifiable psychosocial issues. She scored a 4 on her PHQ-9, but reports that she copes well. Her support system is her  family which includes her husband, her children, and her grand children. Her goals while in the program are to decrease her SOB with exertion. She previously went to the gym months ago, but has not been back in awhile. She is eager to start the program.    Expected Outcomes Pt will continue to have no identifiable psychosocial issue.    Continue Psychosocial Services  No Follow up required             Psychosocial Re-Evaluation:  Psychosocial Re-Evaluation     [place] Name 06/12/21 1003             Psychosocial Re-Evaluation   Current issues with None Identified       Comments Patient is new to the program completing 3 sessions.  Her initial QOL score was 12 and her PHQ-9 was 4.  She seems to enjly coming to the program.  She don't speak english (very little).  Interpreter is here during class .  We will continue to monitor and also she will continue to have no psychosocial issuses identified.       Expected Outcomes Patient will have no  psychosocial issuses identified.       Interventions Relaxation education;Encouraged to attend Pulmonary Rehabilitation for the exercise;Stress management education       Continue Psychosocial Services  No Follow up required                Psychosocial Discharge (Final Psychosocial Re-Evaluation):  Psychosocial Re-Evaluation - 06/12/21 1003       Psychosocial Re-Evaluation   Current issues with None Identified    Comments Patient is new to the program completing 3 sessions.  Her initial QOL score was 12 and her PHQ-9 was 4.  She seems to enjly coming to the program.  She don't speak english (very little).  Interpreter is here during class .  We will continue to monitor and also she will continue to have no psychosocial issuses identified.    Expected Outcomes Patient will have no psychosocial issuses identified.    Interventions Relaxation education;Encouraged to attend Pulmonary Rehabilitation for the exercise;Stress management education    Continue Psychosocial Services  No Follow up required              Education: Education Goals: Education classes will be provided on a weekly basis, covering required topics. Participant will state understanding/return demonstration of topics presented.  Learning Barriers/Preferences:  Learning Barriers/Preferences - 05/29/21 1318       Learning Barriers/Preferences   Learning Barriers Language   Daughter is here to translate during orientation. Will need a translator for her other visits.   Learning Preferences Audio;Computer/Internet;Group Instruction;Individual Instruction;Pictoral;Skilled Demonstration;Verbal Instruction;Video;Written Material             Education Topics: How Lungs Work and Diseases: - Discuss the anatomy of the lungs and diseases that can affect the lungs, such as COPD.   Exercise: -Discuss the importance of exercise, FITT principles of exercise, normal and abnormal responses to exercise, and how to  exercise safely.   Environmental Irritants: -Discuss types of environmental irritants and how to limit exposure to environmental irritants.   Meds/Inhalers and oxygen: - Discuss respiratory medications, definition of an inhaler and oxygen, and the proper way to use an inhaler and oxygen.   Energy Saving Techniques: - Discuss methods to conserve energy and decrease shortness of breath when performing activities of daily living.    Bronchial Hygiene / Breathing Techniques: - Discuss breathing mechanics, pursed-lip breathing  technique,  proper posture, effective ways to clear airways, and other functional breathing techniques   Cleaning Equipment: - Provides group verbal and written instruction about the health risks of elevated stress, cause of high stress, and healthy ways to reduce stress. Flowsheet Row PULMONARY REHAB OTHER RESPIRATORY from 06/14/2021 in [place]  Date 05/31/21  Educator pb  Instruction Review Code 1- Verbalizes Understanding       Nutrition I: Fats: - Discuss the types of cholesterol, what cholesterol does to the body, and how cholesterol levels can be controlled. Flowsheet Row PULMONARY REHAB OTHER RESPIRATORY from 06/14/2021 in [place]  Date 06/07/21  Educator pb  Instruction Review Code 1- Verbalizes Understanding       Nutrition II: Labels: -Discuss the different components of food labels and how to read food labels. Flowsheet Row PULMONARY REHAB OTHER RESPIRATORY from 06/14/2021 in [place]  Date 06/14/21  Educator DF  Instruction Review Code 2- Demonstrated Understanding       Respiratory Infections: - Discuss the signs and symptoms of respiratory infections, ways to prevent respiratory infections, and the importance of seeking medical treatment when having a respiratory infection.   Stress I: Signs and Symptoms: - Discuss the causes of stress, how stress may lead to  anxiety and depression, and ways to limit stress.   Stress II: Relaxation: -Discuss relaxation techniques to limit stress.   Oxygen for Home/Travel: - Discuss how to prepare for travel when on oxygen and proper ways to transport and store oxygen to ensure safety.   Knowledge Questionnaire Score:  Knowledge Questionnaire Score - 05/29/21 1323       Knowledge Questionnaire Score   Pre Score 14/18             Core Components/Risk Factors/Patient Goals at Admission:  Personal Goals and Risk Factors at Admission - 05/29/21 1352       Core Components/Risk Factors/Patient Goals on Admission   Improve shortness of breath with ADL's Yes    Intervention Provide education, individualized exercise plan and daily activity instruction to help decrease symptoms of SOB with activities of daily living.    Expected Outcomes Short Term: Improve cardiorespiratory fitness to achieve a reduction of symptoms when performing ADLs;Long Term: Be able to perform more ADLs without symptoms or delay the onset of symptoms             Core Components/Risk Factors/Patient Goals Review:   Goals and Risk Factor Review     Row Name 06/12/21 1016             Core Components/Risk Factors/Patient Goals Review   Personal Goals Review Improve shortness of breath with ADL's       Review Patient is new in the program completing 3 sessions.  She was referred to PR with ILD by Dr. Chase Caller.  Her personal goals for the program are to decrease SOB  with exertion .  We will encourage her progress as she meets these goals.       Expected Outcomes Patient will complete the program meeting both program and personal goals.                Core Components/Risk Factors/Patient Goals at Discharge (Final Review):   Goals and Risk Factor Review - 06/12/21 1016       Core Components/Risk Factors/Patient Goals Review   Personal Goals Review Improve shortness of breath with ADL's    Review Patient is new in  the program  completing 3 sessions.  She was referred to PR with ILD by Dr. Chase Caller.  Her personal goals for the program are to decrease SOB  with exertion .  We will encourage her progress as she meets these goals.    Expected Outcomes Patient will complete the program meeting both program and personal goals.             ITP Comments:   Comments: ITP REVIEW Pt is making expected progress toward pulmonary rehab goals after completing 7 sessions. Recommend continued exercise, life style modification, education, and utilization of breathing techniques to increase stamina and strength and decrease shortness of breath with exertion.

## 2021-06-21 ENCOUNTER — Encounter: Payer: Self-pay | Admitting: Family Medicine

## 2021-06-21 ENCOUNTER — Encounter (HOSPITAL_COMMUNITY): Payer: Medicare Other

## 2021-06-21 ENCOUNTER — Ambulatory Visit (INDEPENDENT_AMBULATORY_CARE_PROVIDER_SITE_OTHER): Payer: Medicare Other | Admitting: Family Medicine

## 2021-06-21 VITALS — BP 129/87 | HR 81 | Ht 61.0 in | Wt 143.0 lb

## 2021-06-21 DIAGNOSIS — I10 Essential (primary) hypertension: Secondary | ICD-10-CM | POA: Diagnosis not present

## 2021-06-21 DIAGNOSIS — I2510 Atherosclerotic heart disease of native coronary artery without angina pectoris: Secondary | ICD-10-CM

## 2021-06-21 DIAGNOSIS — E785 Hyperlipidemia, unspecified: Secondary | ICD-10-CM | POA: Diagnosis not present

## 2021-06-21 DIAGNOSIS — E039 Hypothyroidism, unspecified: Secondary | ICD-10-CM

## 2021-06-21 DIAGNOSIS — R7303 Prediabetes: Secondary | ICD-10-CM

## 2021-06-21 DIAGNOSIS — Z23 Encounter for immunization: Secondary | ICD-10-CM

## 2021-06-21 LAB — BAYER DCA HB A1C WAIVED: HB A1C (BAYER DCA - WAIVED): 5.3 % (ref 4.8–5.6)

## 2021-06-21 MED ORDER — LEVOTHYROXINE SODIUM 50 MCG PO TABS: 50.0000 ug | ORAL_TABLET | Freq: Every day | ORAL | 3 refills | Status: DC

## 2021-06-21 MED ORDER — HYDROCHLOROTHIAZIDE 25 MG PO TABS: 25.0000 mg | ORAL_TABLET | Freq: Every day | ORAL | 3 refills | Status: DC

## 2021-06-21 MED ORDER — ATORVASTATIN CALCIUM 20 MG PO TABS: 20.0000 mg | ORAL_TABLET | Freq: Every day | ORAL | 3 refills | Status: DC

## 2021-06-21 NOTE — Progress Notes (Signed)
? ?BP 129/87   Pulse 81   Ht 5\' 1"  (1.549 m)   Wt 143 lb (64.9 kg)   SpO2 98%   BMI 27.02 kg/m?   ? ?Subjective:  ? ?[name], female    DOB: 1951-01-25, 71 y.o.   MRN: [id number] ? ?HPI: ?Tasha [name] is a 71 y.o. female presenting on 06/21/2021 for Medical Management of Chronic Issues, Prediabetes, and Hypertension ? ? ?HPI ?Prediabetes ?Patient comes in today for recheck of his diabetes. Patient has been currently taking no medication currently, A1c is 5.3 looks good, diet controlled.. Patient is currently on an ACE inhibitor/ARB. Patient has not seen an ophthalmologist this year. Patient denies any issues with their feet. The symptom started onset as an adult hctz ARE RELATED TO DM  ? ?Hypertension ?Patient is currently on hydrochlorothiazide and losartan, and their blood pressure today is 129/87. Patient denies any lightheadedness or dizziness. Patient denies headaches, blurred vision, chest pains, shortness of breath, or weakness. Denies any side effects from medication and is content with current medication.  ? ?Hyperlipidemia ?Patient is coming in for recheck of his hyperlipidemia. The patient is currently taking fish oils. They deny any issues with myalgias or history of liver damage from it. They deny any focal numbness or weakness or chest pain.  ? ?Hypothyroidism recheck ?Patient is coming in for thyroid recheck today as well. They deny any issues with hair changes or heat or cold problems or diarrhea or constipation. They deny any chest pain or palpitations. They are currently on levothyroxine 50 micrograms  ? ?Relevant past medical, surgical, family and social history reviewed and updated as indicated. Interim medical history since our last visit reviewed. ?Allergies and medications reviewed and updated. ? ?Review of Systems  ?Constitutional:  Negative for chills and fever.  ?HENT:  Negative for congestion, ear discharge and ear pain.   ?Eyes:  Negative for redness and visual  disturbance.  ?Respiratory:  Negative for chest tightness and shortness of breath.   ?Cardiovascular:  Negative for chest pain and leg swelling.  ?Genitourinary:  Negative for difficulty urinating and dysuria.  ?Musculoskeletal:  Negative for back pain and gait problem.  ?Skin:  Negative for rash.  ?Neurological:  Negative for light-headedness and headaches.  ?Psychiatric/Behavioral:  Negative for agitation and behavioral problems.   ?All other systems reviewed and are negative. ? ?Per HPI unless specifically indicated above ? ? ?Allergies as of 06/21/2021   ? ?   Reactions  ? Hydromet [hydrocodone Bit-homatrop Mbr]   ? Pt doesn't remember  ? Toprol Xl [metoprolol] Hives  ? Facial rash, itching, hives  ? Amoxicillin Rash  ? Did it involve swelling of the face/tongue/throat, SOB, or low BP? No ?Did it involve sudden or severe rash/hives, skin peeling, or any reaction on the inside of your mouth or nose? Unknown ?Did you need to seek medical attention at a hospital or doctor's office? Unknown ?When did it last happen?      10 + years ?If all above answers are ?NO?, may proceed with cephalosporin use.  ? ?  ? ?  ?Medication List  ?  ? ?  ? Accurate as of June 21, 2021  1:44 PM. If you have any questions, ask your nurse or doctor.  ?  ?  ? ?  ? ?STOP taking these medications   ? ?diltiazem 120 MG 24 hr capsule ?Commonly known as: CARDIZEM CD ?Stopped by: Worthy Rancher, MD ?  ?guaiFENesin 600 MG 12 hr  tablet ?Commonly known as: Mucinex ?Stopped by: Worthy Rancher, MD ?  ? ?  ? ?TAKE these medications   ? ?acetaminophen 500 MG tablet ?Commonly known as: TYLENOL ?Take 1 tablet (500 mg total) by mouth every 6 (six) hours as needed. ?  ?aspirin EC 81 MG tablet ?Take 1 tablet (81 mg total) by mouth daily. ?  ?atorvastatin 20 MG tablet ?Commonly known as: LIPITOR ?Take 1 tablet (20 mg total) by mouth daily at 6 PM. ?  ?budesonide 180 MCG/ACT inhaler ?Commonly known as: PULMICORT ?Inhale 2 puffs into the lungs in the  morning and at bedtime. ?  ?cetirizine 10 MG tablet ?Commonly known as: ZYRTEC ?Take 1 tablet (10 mg total) by mouth daily as needed for allergies. ?  ?Combivent Respimat 20-100 MCG/ACT Aers respimat ?Generic drug: Ipratropium-Albuterol ?Inhale 1 puff into the lungs every 6 (six) hours as needed for wheezing. ?  ?hydrochlorothiazide 25 MG tablet ?Commonly known as: HYDRODIURIL ?Take 1 tablet (25 mg total) by mouth daily. ?  ?ibuprofen 200 MG tablet ?Commonly known as: ADVIL ?Take 400 mg by mouth every 6 (six) hours as needed for headache or moderate pain. ?  ?levothyroxine 50 MCG tablet ?Commonly known as: SYNTHROID ?Take 1 tablet (50 mcg total) by mouth daily before breakfast. ?  ?losartan 50 MG tablet ?Commonly known as: COZAAR ?Take 1 tablet (50 mg total) by mouth daily. ?  ?Omega 3 1000 MG Caps ?Take 1,000 mg by mouth daily. ?  ?ondansetron 8 MG disintegrating tablet ?Commonly known as: ZOFRAN-ODT ?Take 1 tablet (8 mg total) by mouth every 6 (six) hours as needed for nausea or vomiting. ?  ?pantoprazole 40 MG tablet ?Commonly known as: PROTONIX ?Take 1 tablet (40 mg total) by mouth daily. ?  ?potassium chloride SA 20 MEQ tablet ?Commonly known as: KLOR-CON M ?Take 1 tablet (20 mEq total) by mouth daily. ?  ? ?  ? ? ? ?Objective:  ? ?BP 129/87   Pulse 81   Ht 5\' 1"  (1.549 m)   Wt 143 lb (64.9 kg)   SpO2 98%   BMI 27.02 kg/m?   ?Wt Readings from Last 3 Encounters:  ?06/21/21 143 lb (64.9 kg)  ?06/19/21 138 lb 10.7 oz (62.9 kg)  ?06/05/21 140 lb 6.9 oz (63.7 kg)  ?  ?Physical Exam ?Vitals and nursing note reviewed.  ?Constitutional:   ?   General: She is not in acute distress. ?   Appearance: She is well-developed. She is not diaphoretic.  ?Eyes:  ?   Conjunctiva/sclera: Conjunctivae normal.  ?Cardiovascular:  ?   Rate and Rhythm: Normal rate and regular rhythm.  ?   Heart sounds: Normal heart sounds. No murmur heard. ?Pulmonary:  ?   Effort: Pulmonary effort is normal. No respiratory distress.  ?   Breath  sounds: Normal breath sounds. No wheezing.  ?Musculoskeletal:     ?   General: No tenderness. Normal range of motion.  ?Skin: ?   General: Skin is warm and dry.  ?   Findings: No rash.  ?Neurological:  ?   Mental Status: She is alert and oriented to person, place, and time.  ?   Coordination: Coordination normal.  ?Psychiatric:     ?   Behavior: Behavior normal.  ? ? ? ? ?Assessment & Plan:  ? ?Problem List Items Addressed This Visit   ? ?  ? Cardiovascular and Mediastinum  ? Hypertension  ? Relevant Medications  ? hydrochlorothiazide (HYDRODIURIL) 25 MG tablet  ? atorvastatin (  LIPITOR) 20 MG tablet  ?  ? Endocrine  ? Hypothyroidism  ? Relevant Medications  ? levothyroxine (SYNTHROID) 50 MCG tablet  ?  ? Other  ? Hyperlipidemia with target LDL less than 100  ? Relevant Medications  ? hydrochlorothiazide (HYDRODIURIL) 25 MG tablet  ? atorvastatin (LIPITOR) 20 MG tablet  ? Prediabetes - Primary  ? Relevant Orders  ? Bayer DCA Hb A1c Waived  ? ?Other Visit Diagnoses   ? ? Need for shingles vaccine      ? Relevant Orders  ? Varicella-zoster vaccine IM (Shingrix) (Completed)  ? Essential hypertension      ? Relevant Medications  ? hydrochlorothiazide (HYDRODIURIL) 25 MG tablet  ? atorvastatin (LIPITOR) 20 MG tablet  ? ?  ?  ?A1c 5.3, no changes, seems to be doing well.  Continue current medicine. ?Follow up plan: ?Return in about 3 months (around 09/21/2021), or if symptoms worsen or fail to improve, for htn and hld and thyroid. ? ?Counseling provided for all of the vaccine components ?Orders Placed This Encounter  ?Procedures  ? Varicella-zoster vaccine IM (Shingrix)  ? Bayer DCA Hb A1c Waived  ? ? ?Caryl Pina, MD ?[place] ?06/21/2021, 1:44 PM ? ? ?  ?

## 2021-06-26 ENCOUNTER — Encounter (HOSPITAL_COMMUNITY)
Admission: RE | Admit: 2021-06-26 | Discharge: 2021-06-26 | Disposition: A | Discharge status: Home / Self Care | Payer: Medicare Other | Source: Ambulatory Visit | Attending: Internal Medicine | Admitting: Internal Medicine

## 2021-06-26 DIAGNOSIS — J849 Interstitial pulmonary disease, unspecified: Secondary | ICD-10-CM | POA: Insufficient documentation

## 2021-06-26 NOTE — Progress Notes (Signed)
Daily Session Note ? ?Patient Details  ?Name: Tasha [name] ?MRN: [id number] ?Date of Birth: April 23, 1950 ?Referring Provider:   ?Flowsheet Row PULMONARY REHAB OTHER RESP ORIENTATION from 05/29/2021 in [place]  ?Referring Provider Dr. Chase Caller  ? ?  ? ? ?Encounter Date: 06/26/2021 ? ?Check In: ? Session Check In - 06/26/21 1328   ? ?  ? Check-In  ? Physician(s) Dr. Gasper Sells   ? Location [facility]   ? Staff Present Geanie Cooley, RN;Dalton Kris Mouton, MS, ACSM-CEP, Exercise Physiologist;Heather Zigmund Daniel, Exercise Physiologist   ? Virtual Visit No   ? Medication changes reported     No   ? Fall or balance concerns reported    No   ? Tobacco Cessation No Change   ? Warm-up and Cool-down Performed as group-led instruction   ? Resistance Training Performed Yes   ? VAD Patient? No   ? PAD/SET Patient? No   ?  ? Pain Assessment  ? Currently in Pain? No/denies   ? Multiple Pain Sites No   ? ?  ?  ? ?  ? ? ?Capillary Blood Glucose: ?No results found for this or any previous visit (from the past 24 hour(s)). ? ? ? ?Social History  ? ?Tobacco Use  ?Smoking Status Former  ?Smokeless Tobacco Never  ? ? ?Goals Met:  ?Independence with exercise equipment ?Exercise tolerated well ?No report of concerns or symptoms today ?Strength training completed today ? ?Goals Unmet:  ?Not Applicable ? ?Comments: checkout @ 2:30pm ? ? ?Dr. Kathie Dike is Medical Director for [facility] Pulmonary Rehab. ?

## 2021-06-28 ENCOUNTER — Encounter (HOSPITAL_COMMUNITY)
Admission: RE | Admit: 2021-06-28 | Discharge: 2021-06-28 | Disposition: A | Discharge status: Home / Self Care | Payer: Medicare Other | Source: Ambulatory Visit | Attending: Internal Medicine | Admitting: Internal Medicine

## 2021-06-28 DIAGNOSIS — J849 Interstitial pulmonary disease, unspecified: Secondary | ICD-10-CM | POA: Diagnosis not present

## 2021-06-28 NOTE — Progress Notes (Signed)
Daily Session Note ? ?Patient Details  ?Name: Tasha [name] ?MRN: [id number] ?Date of Birth: 10-21-1950 ?Referring Provider:   ?Flowsheet Row PULMONARY REHAB OTHER RESP ORIENTATION from 05/29/2021 in [place]  ?Referring Provider Dr. Chase Caller  ? ?  ? ? ?Encounter Date: 06/28/2021 ? ?Check In: ? Session Check In - 06/28/21 1330   ? ?  ? Check-In  ? Supervising physician immediately available to respond to emergencies [facility] MD immediately available   ? Physician(s) Dr. Johnsie Cancel   ? Location [facility]   ? Staff Present Geanie Cooley, RN;Heather Otho Ket, [place], Exercise Physiologist   ? Virtual Visit No   ? Medication changes reported     No   ? Fall or balance concerns reported    No   ? Resistance Training Performed Yes   ? VAD Patient? No   ? PAD/SET Patient? No   ?  ? Pain Assessment  ? Currently in Pain? No/denies   ? Multiple Pain Sites No   ? ?  ?  ? ?  ? ? ?Capillary Blood Glucose: ?No results found for this or any previous visit (from the past 24 hour(s)). ? ? ? ?Social History  ? ?Tobacco Use  ?Smoking Status Former  ?Smokeless Tobacco Never  ? ? ?Goals Met:  ?Improved SOB with ADL's ?Exercise tolerated well ?No report of concerns or symptoms today ?Strength training completed today ? ?Goals Unmet:  ?Not Applicable ? ?Comments: check out @ 2:30pm ? ? ?Dr. Kathie Dike is Medical Director for [facility] Pulmonary Rehab. ?

## 2021-07-03 ENCOUNTER — Encounter (HOSPITAL_COMMUNITY)
Admission: RE | Admit: 2021-07-03 | Discharge: 2021-07-03 | Disposition: A | Discharge status: Home / Self Care | Payer: Medicare Other | Source: Ambulatory Visit | Attending: Internal Medicine | Admitting: Internal Medicine

## 2021-07-03 VITALS — Wt 136.5 lb

## 2021-07-03 DIAGNOSIS — J849 Interstitial pulmonary disease, unspecified: Secondary | ICD-10-CM

## 2021-07-03 NOTE — Progress Notes (Signed)
Daily Session Note ? ?Patient Details  ?Name: Tasha [name] ?MRN: [id number] ?Date of Birth: Oct 21, 1950 ?Referring Provider:   ?Flowsheet Row PULMONARY REHAB OTHER RESP ORIENTATION from 05/29/2021 in [place]  ?Referring Provider Dr. Chase Caller  ? ?  ? ? ?Encounter Date: 07/03/2021 ? ?Check In: ? Session Check In - 07/03/21 1330   ? ?  ? Check-In  ? Supervising physician immediately available to respond to emergencies [facility] MD immediately available   ? Physician(s) Dr Johney Frame   ? Location [facility]   ? Staff Present Hoy Register, MS, ACSM-CEP, Exercise Physiologist;Heather Zigmund Daniel, Exercise Physiologist   Madelyn Flavors, BSN  ? Virtual Visit No   ? Medication changes reported     No   ? Fall or balance concerns reported    No   ? Tobacco Cessation No Change   ? Warm-up and Cool-down Performed as group-led instruction   ? Resistance Training Performed Yes   ? VAD Patient? No   ? PAD/SET Patient? No   ?  ? Pain Assessment  ? Currently in Pain? No/denies   ? Multiple Pain Sites No   ? ?  ?  ? ?  ? ? ?Capillary Blood Glucose: ?No results found for this or any previous visit (from the past 24 hour(s)). ? ? ? ?Social History  ? ?Tobacco Use  ?Smoking Status Former  ?Smokeless Tobacco Never  ? ? ?Goals Met:  ?Independence with exercise equipment ?Exercise tolerated well ?No report of concerns or symptoms today ?Strength training completed today ? ?Goals Unmet:  ?Not Applicable ? ?Comments: Check  ? ?out at 1430. ? ? ? ?Dr. Kathie Dike is Medical Director for [facility] Pulmonary Re[facility]. ?

## 2021-07-05 ENCOUNTER — Encounter (HOSPITAL_COMMUNITY)
Admission: RE | Admit: 2021-07-05 | Discharge: 2021-07-05 | Disposition: A | Discharge status: Home / Self Care | Payer: Medicare Other | Source: Ambulatory Visit | Attending: Internal Medicine | Admitting: Internal Medicine

## 2021-07-05 DIAGNOSIS — J849 Interstitial pulmonary disease, unspecified: Secondary | ICD-10-CM | POA: Diagnosis not present

## 2021-07-05 NOTE — Progress Notes (Signed)
Daily Session Note ? ?Patient Details  ?Name: Tasha [name] ?MRN: [id number] ?Date of Birth: 02/27/1951 ?Referring Provider:   ?Flowsheet Row PULMONARY REHAB OTHER RESP ORIENTATION from 05/29/2021 in [place]  ?Referring Provider Dr. Chase Caller  ? ?  ? ? ?Encounter Date: 07/05/2021 ? ?Check In: ? Session Check In - 07/05/21 1327   ? ?  ? Check-In  ? Supervising physician immediately available to respond to emergencies [facility] MD immediately available   ? Physician(s) Dr Harl Bowie   ? Location [facility]   ? Staff Present Geanie Cooley, RN;Heather Otho Ket, BS, Exercise Physiologist;Debra Wynetta Emery, RN, Joanette Gula, RN, BSN   ? Virtual Visit No   ? Medication changes reported     No   ? Fall or balance concerns reported    No   ? Tobacco Cessation No Change   ? Warm-up and Cool-down Performed as group-led instruction   ? Resistance Training Performed Yes   ? VAD Patient? No   ? PAD/SET Patient? No   ?  ? Pain Assessment  ? Currently in Pain? No/denies   ? Multiple Pain Sites No   ? ?  ?  ? ?  ? ? ?Capillary Blood Glucose: ?No results found for this or any previous visit (from the past 24 hour(s)). ? ? ? ?Social History  ? ?Tobacco Use  ?Smoking Status Former  ?Smokeless Tobacco Never  ? ? ?Goals Met:  ?Independence with exercise equipment ?Exercise tolerated well ?No report of concerns or symptoms today ?Strength training completed today ? ?Goals Unmet:  ?Not Applicable ? ?Comments: Checkout at 1430. ? ? ?Dr. Carlyle Dolly is Medical Director for [place] ?

## 2021-07-10 ENCOUNTER — Encounter (HOSPITAL_COMMUNITY)
Admission: RE | Admit: 2021-07-10 | Discharge: 2021-07-10 | Disposition: A | Discharge status: Home / Self Care | Payer: Medicare Other | Source: Ambulatory Visit | Attending: Internal Medicine | Admitting: Internal Medicine

## 2021-07-10 DIAGNOSIS — J849 Interstitial pulmonary disease, unspecified: Secondary | ICD-10-CM | POA: Diagnosis not present

## 2021-07-10 NOTE — Progress Notes (Signed)
Daily Session Note ? ?Patient Details  ?Name: Tasha [name] ?MRN: [id number] ?Date of Birth: January 28, 1951 ?Referring Provider:   ?Flowsheet Row PULMONARY REHAB OTHER RESP ORIENTATION from 05/29/2021 in [place]  ?Referring Provider Dr. Chase Caller  ? ?  ? ? ?Encounter Date: 07/10/2021 ? ?Check In: ? Session Check In - 07/10/21 1326   ? ?  ? Check-In  ? Supervising physician immediately available to respond to emergencies [facility] MD immediately available   ? Physician(s) Dr Domenic Polite   ? Location [facility]   ? Staff Present Geanie Cooley, RN;Heather Otho Ket, [place], Exercise Physiologist;Ameia Morency Hassell Done, RN, Bjorn Loser, MS, ACSM-CEP, Exercise Physiologist   ? Virtual Visit No   ? Medication changes reported     No   ? Fall or balance concerns reported    No   ? Tobacco Cessation No Change   ? Warm-up and Cool-down Performed as group-led instruction   ? Resistance Training Performed Yes   ? VAD Patient? No   ? PAD/SET Patient? No   ?  ? Pain Assessment  ? Currently in Pain? No/denies   ? Multiple Pain Sites No   ? ?  ?  ? ?  ? ? ?Capillary Blood Glucose: ?No results found for this or any previous visit (from the past 24 hour(s)). ? ? ? ?Social History  ? ?Tobacco Use  ?Smoking Status Former  ?Smokeless Tobacco Never  ? ? ?Goals Met:  ?Independence with exercise equipment ?Exercise tolerated well ?No report of concerns or symptoms today ?Strength training completed today ? ?Goals Unmet:  ?Not Applicable ? ?Comments: Check out at 1430 ? ? ?Dr. Kathie Dike is Medical Director for [facility] Pulmonary Rehab. ?

## 2021-07-12 ENCOUNTER — Encounter (HOSPITAL_COMMUNITY)
Admission: RE | Admit: 2021-07-12 | Discharge: 2021-07-12 | Disposition: A | Discharge status: Home / Self Care | Payer: Medicare Other | Source: Ambulatory Visit | Attending: Internal Medicine | Admitting: Internal Medicine

## 2021-07-12 DIAGNOSIS — J849 Interstitial pulmonary disease, unspecified: Secondary | ICD-10-CM | POA: Diagnosis not present

## 2021-07-12 NOTE — Progress Notes (Signed)
Daily Session Note ? ?Patient Details  ?Name: Tasha [name] ?MRN: [id number] ?Date of Birth: November 24, 1950 ?Referring Provider:   ?Flowsheet Row PULMONARY REHAB OTHER RESP ORIENTATION from 05/29/2021 in [place]  ?Referring Provider Dr. Chase Caller  ? ?  ? ? ?Encounter Date: 07/12/2021 ? ?Check In: ? Session Check In - 07/12/21 1327   ? ?  ? Check-In  ? Supervising physician immediately available to respond to emergencies [facility] MD immediately available   ? Physician(s) Dr Debara Pickett   ? Location [facility]   ? Staff Present Redge Gainer, BS, Exercise Physiologist;Dalton Kris Mouton, MS, ACSM-CEP, Exercise Physiologist;Kayly Kriegel Hassell Done, RN, BSN   ? Virtual Visit No   ? Medication changes reported     No   ? Fall or balance concerns reported    No   ? Tobacco Cessation No Change   ? Warm-up and Cool-down Performed as group-led instruction   ? Resistance Training Performed Yes   ? VAD Patient? No   ? PAD/SET Patient? No   ?  ? Pain Assessment  ? Currently in Pain? No/denies   ? Multiple Pain Sites No   ? ?  ?  ? ?  ? ? ?Capillary Blood Glucose: ?No results found for this or any previous visit (from the past 24 hour(s)). ? ? ? ?Social History  ? ?Tobacco Use  ?Smoking Status Former  ?Smokeless Tobacco Never  ? ? ?Goals Met:  ?Independence with exercise equipment ?Exercise tolerated well ?No report of concerns or symptoms today ?Strength training completed today ? ?Goals Unmet:  ?Not Applicable ? ?Comments: Check out at 1430 ? ? ?Dr. Kathie Dike is Medical Director for [facility] Pulmonary Rehab. ?

## 2021-07-17 ENCOUNTER — Encounter (HOSPITAL_COMMUNITY)
Admission: RE | Admit: 2021-07-17 | Discharge: 2021-07-17 | Disposition: A | Discharge status: Home / Self Care | Payer: Medicare Other | Source: Ambulatory Visit | Attending: Internal Medicine | Admitting: Internal Medicine

## 2021-07-17 VITALS — Wt 136.9 lb

## 2021-07-17 DIAGNOSIS — J849 Interstitial pulmonary disease, unspecified: Secondary | ICD-10-CM

## 2021-07-17 NOTE — Progress Notes (Signed)
Daily Session Note ? ?Patient Details  ?Name: Tasha [name] ?MRN: [id number] ?Date of Birth: Apr 01, 1951 ?Referring Provider:   ?Flowsheet Row PULMONARY REHAB OTHER RESP ORIENTATION from 05/29/2021 in [place]  ?Referring Provider Dr. Chase Caller  ? ?  ? ? ?Encounter Date: 07/17/2021 ? ?Check In: ? Session Check In - 07/17/21 1330   ? ?  ? Check-In  ? Supervising physician immediately available to respond to emergencies [facility] MD immediately available   ? Physician(s) Dr Marlou Porch   ? Location [facility]   ? Staff Present Geanie Cooley, RN;Andjela Wickes Hassell Done, RN, Bjorn Loser, MS, ACSM-CEP, Exercise Physiologist;Heather Zigmund Daniel, Exercise Physiologist   ? Virtual Visit No   ? Medication changes reported     No   ? Fall or balance concerns reported    No   ? Tobacco Cessation No Change   ? Warm-up and Cool-down Performed as group-led instruction   ? Resistance Training Performed Yes   ? VAD Patient? No   ? PAD/SET Patient? No   ?  ? Pain Assessment  ? Currently in Pain? No/denies   ? Multiple Pain Sites No   ? ?  ?  ? ?  ? ? ?Capillary Blood Glucose: ?No results found for this or any previous visit (from the past 24 hour(s)). ? ? ? ?Social History  ? ?Tobacco Use  ?Smoking Status Former  ?Smokeless Tobacco Never  ? ? ?Goals Met:  ?Independence with exercise equipment ?Exercise tolerated well ?No report of concerns or symptoms today ?Strength training completed today ? ?Goals Unmet:  ?Not Applicable ? ?Comments: Check out at 1430. ? ? ?Dr. Kathie Dike is Medical Director for [facility] Pulmonary Rehab. ?

## 2021-07-18 NOTE — Progress Notes (Signed)
Pulmonary Individual Treatment Plan ? ?Patient Details  ?Name: Tasha [name] ?MRN: [id number] ?Date of Birth: 1951-01-10 ?Referring Provider:   ?Flowsheet Row PULMONARY REHAB OTHER RESP ORIENTATION from 05/29/2021 in [place]  ?Referring Provider Dr. Chase Caller  ? ?  ? ? ?Initial Encounter Date:  ?Flowsheet Row PULMONARY REHAB OTHER RESP ORIENTATION from 05/29/2021 in [place]  ?Date 05/29/21  ? ?  ? ? ?Visit Diagnosis: Interstitial lung disease ([place]) ? ?Patient's Home Medications on Admission:  ? ?Current Outpatient Medications:  ?  acetaminophen (TYLENOL) 500 MG tablet, Take 1 tablet (500 mg total) by mouth every 6 (six) hours as needed., Disp: 30 tablet, Rfl: 0 ?  aspirin EC 81 MG tablet, Take 1 tablet (81 mg total) by mouth daily., Disp: 90 tablet, Rfl: 3 ?  atorvastatin (LIPITOR) 20 MG tablet, Take 1 tablet (20 mg total) by mouth daily at 6 PM., Disp: 90 tablet, Rfl: 3 ?  budesonide (PULMICORT) 180 MCG/ACT inhaler, Inhale 2 puffs into the lungs in the morning and at bedtime., Disp: 1 each, Rfl: 5 ?  cetirizine (ZYRTEC) 10 MG tablet, Take 1 tablet (10 mg total) by mouth daily as needed for allergies., Disp: 90 tablet, Rfl: 3 ?  hydrochlorothiazide (HYDRODIURIL) 25 MG tablet, Take 1 tablet (25 mg total) by mouth daily., Disp: 90 tablet, Rfl: 3 ?  ibuprofen (ADVIL) 200 MG tablet, Take 400 mg by mouth every 6 (six) hours as needed for headache or moderate pain., Disp: , Rfl:  ?  Ipratropium-Albuterol (COMBIVENT RESPIMAT) 20-100 MCG/ACT AERS respimat, Inhale 1 puff into the lungs every 6 (six) hours as needed for wheezing., Disp: 4 g, Rfl: 5 ?  levothyroxine (SYNTHROID) 50 MCG tablet, Take 1 tablet (50 mcg total) by mouth daily before breakfast., Disp: 90 tablet, Rfl: 3 ?  losartan (COZAAR) 50 MG tablet, Take 1 tablet (50 mg total) by mouth daily., Disp: 90 tablet, Rfl: 3 ?  Omega 3 1000 MG CAPS, Take 1,000 mg by mouth daily. , Disp: , Rfl:  ?  ondansetron (ZOFRAN-ODT) 8  MG disintegrating tablet, Take 1 tablet (8 mg total) by mouth every 6 (six) hours as needed for nausea or vomiting., Disp: 20 tablet, Rfl: 1 ?  pantoprazole (PROTONIX) 40 MG tablet, Take 1 tablet (40 mg total) by mouth daily., Disp: 90 tablet, Rfl: 3 ?  potassium chloride (KLOR-CON) 20 MEQ tablet, Take 1 tablet (20 mEq total) by mouth daily., Disp: 90 tablet, Rfl: 3 ? ?Past Medical History: ?Past Medical History:  ?Diagnosis Date  ? Acute respiratory failure ([place])   ? CAP (community acquired pneumonia)   ? Hyperlipidemia   ? Hypertension   ? Metabolic syndrome   ? ? ?Tobacco Use: ?Social History  ? ?Tobacco Use  ?Smoking Status Former  ?Smokeless Tobacco Never  ? ? ?Labs: ?Review Flowsheet   ? ?  ?  Latest Ref Rng & Units 07/14/2019 10/18/2019 03/23/2020 03/23/2021  ?Labs for ITP Cardiac and Pulmonary Rehab  ?Cholestrol 100 - 199 mg/dL  117   135   125    ?LDL (calc) 0 - 99 mg/dL  51   71   66    ?HDL-C >39 mg/dL  36   45   41    ?Trlycerides 0 - 149 mg/dL  177   105   94    ?Hemoglobin A1c 4.8 - 5.6 % 5.3   5.6   5.3   5.1    ? ?  06/21/2021  ?Labs for  [facility]P Cardiac and Pulmonary Rehab  ?Cholestrol   ?LDL (calc)   ?HDL-C   ?Trlycerides   ?Hemoglobin A1c 5.3    ?  ? ? Multiple values from one day are sorted in reverse-chronological order  ?  ?  ? ? ?Capillary Blood Glucose: ?Lab Results  ?Component Value Date  ? GLUCAP 165 (H) 01/09/2018  ? GLUCAP 142 (H) 01/08/2018  ? GLUCAP 139 (H) 01/08/2018  ? GLUCAP 117 (H) 01/08/2018  ? GLUCAP 85 01/08/2018  ? ? ? ?Pulmonary Assessment Scores: ? Pulmonary Assessment Scores   ? ? [place] Name 05/29/21 1305  ?  ?  ?  ? ADL UCSD  ? ADL Phase Entry    ? SOB Score total 13    ? Rest 0    ? Walk 1    ? Stairs 4    ? Bath 0    ? Dress 0    ? Shop 0    ?  ? CAT Score  ? CAT Score 12    ?  ? mMRC Score  ? mMRC Score 2    ? ?  ?  ? ?  ? ?UCSD: ?Self-administered rating of dyspnea associated with activities of daily living (ADLs) ?6-point scale (0 = "not at all" to 5 = "maximal or unable to do  because of breathlessness")  ?Scoring Scores range from 0 to 120.  Minimally important difference is 5 units ? ?CAT: ?CAT can identify the health impairment of COPD patients and is better correlated with disease progression.  ?CAT has a scoring range of zero to 40. The CAT score is classified into four groups of low (less than 10), medium (10 - 20), high (21-30) and very high (31-40) based on the impact level of disease on health status. A CAT score over 10 suggests significant symptoms.  A worsening CAT score could be explained by an exacerbation, poor medication adherence, poor inhaler technique, or progression of COPD or comorbid conditions.  ?CAT MCID is 2 points ? ?mMRC: ?mMRC (Modified Medical Research Council) Dyspnea Scale is used to assess the degree of baseline functional disability in patients of respiratory disease due to dyspnea. ?No minimal important difference is established. A decrease in score of 1 point or greater is considered a positive change.  ? ?Pulmonary Function Assessment: ? ? ?Exercise Target Goals: ?Exercise Program Goal: ?Individual exercise prescription set using results from initial 6 min walk test and THRR while considering  patient?s activity barriers and safety.  ? ?Exercise Prescription Goal: ?Initial exercise prescription builds to 30-45 minutes a day of aerobic activity, 2-3 days per week.  Home exercise guidelines will be given to patient during program as part of exercise prescription that the participant will acknowledge. ? ?Activity Barriers & Risk Stratification: ? Activity Barriers & Cardiac Risk Stratification - 05/29/21 1304   ? ?  ? Activity Barriers & Cardiac Risk Stratification  ? Activity Barriers Shortness of Breath   ? Cardiac Risk Stratification Moderate   ? ?  ?  ? ?  ? ? ?6 Minute Walk: ? 6 Minute Walk   ? ? [place] Name 05/29/21 1456  ?  ?  ?  ? 6 Minute Walk  ? Phase Initial    ? Distance 1150 feet    ? Walk Time 6 minutes    ? # of Rest Breaks 0    ? MPH 2.18    ?  METS 2.74    ? RPE 10    ?  Perceived Dyspnea  10    ? VO2 Peak 9.58    ? Symptoms No    ? Resting HR 88 bpm    ? Resting BP 138/82    ? Resting Oxygen Saturation  95 %    ? Exercise Oxygen Saturation  during 6 min walk 93 %    ? Max Ex. HR 111 bpm    ? Max Ex. BP 140/84    ? 2 Minute Post BP 130/78    ?  ? Interval HR  ? 1 Minute HR 105    ? 2 Minute HR 110    ? 3 Minute HR 111    ? 4 Minute HR 108    ? 5 Minute HR 111    ? 6 Minute HR 110    ? 2 Minute Post HR 92    ? Interval Heart Rate? Yes    ?  ? Interval Oxygen  ? Interval Oxygen? Yes    ? Baseline Oxygen Saturation % 95 %    ? 1 Minute Oxygen Saturation % 93 %    ? 1 Minute Liters of Oxygen 0 L    ? 2 Minute Oxygen Saturation % 94 %    ? 2 Minute Liters of Oxygen 0 L    ? 3 Minute Oxygen Saturation % 93 %    ? 3 Minute Liters of Oxygen 0 L    ? 4 Minute Oxygen Saturation % 93 %    ? 4 Minute Liters of Oxygen 0 L    ? 5 Minute Oxygen Saturation % 93 %    ? 5 Minute Liters of Oxygen 0 L    ? 6 Minute Oxygen Saturation % 93 %    ? 6 Minute Liters of Oxygen 0 L    ? 2 Minute Post Oxygen Saturation % 96 %    ? 2 Minute Post Liters of Oxygen 0 L    ? ?  ?  ? ?  ? ? ?Oxygen Initial Assessment: ? Oxygen Initial Assessment - 05/29/21 1502   ? ?  ? Initial 6 min Walk  ? Oxygen Used None   ?  ? Program Oxygen Prescription  ? Program Oxygen Prescription None   ?  ? Intervention  ? Short Term Goals To learn and understand importance of monitoring SPO2 with pulse oximeter and demonstrate accurate use of the pulse oximeter.;To learn and demonstrate proper pursed lip breathing techniques or other breathing techniques. ;To learn and understand importance of maintaining oxygen saturations>88%   ? Long  Term Goals Compliance with respiratory medication;Exhibits proper breathing techniques, such as pursed lip breathing or other method taught during program session;Maintenance of O2 saturations>88%;Verbalizes importance of monitoring SPO2 with pulse oximeter and return  demonstration   ? ?  ?  ? ?  ? ? ?Oxygen Re-Evaluation: ? Oxygen Re-Evaluation   ? ? [place] Name 06/19/21 1453 07/17/21 1448  ?  ?  ?  ?  ? Program Oxygen Prescription  ? Program Oxygen Prescription None None     ?  ?

## 2021-07-19 ENCOUNTER — Encounter (HOSPITAL_COMMUNITY)
Admission: RE | Admit: 2021-07-19 | Discharge: 2021-07-19 | Disposition: A | Discharge status: Home / Self Care | Payer: Medicare Other | Source: Ambulatory Visit | Attending: Internal Medicine | Admitting: Internal Medicine

## 2021-07-19 DIAGNOSIS — Z20822 Contact with and (suspected) exposure to covid-19: Secondary | ICD-10-CM | POA: Diagnosis not present

## 2021-07-19 DIAGNOSIS — J849 Interstitial pulmonary disease, unspecified: Secondary | ICD-10-CM | POA: Diagnosis not present

## 2021-07-19 NOTE — Progress Notes (Signed)
Daily Session Note ? ?Patient Details  ?Name: Tasha [name] ?MRN: [id number] ?Date of Birth: 1950/11/17 ?Referring Provider:   ?Flowsheet Row PULMONARY REHAB OTHER RESP ORIENTATION from 05/29/2021 in [place]  ?Referring Provider Dr. Chase Caller  ? ?  ? ? ?Encounter Date: 07/19/2021 ? ?Check In: ? Session Check In - 07/19/21 1330   ? ?  ? Check-In  ? Supervising physician immediately available to respond to emergencies [facility] MD immediately available   ? Physician(s) Dr. Harl Bowie   ? Location [facility]   ? Staff Present Geanie Cooley, RN;Heather Otho Ket, BS, Exercise Physiologist;Dalton Fletcher, MS, ACSM-CEP, Exercise Physiologist   ? Virtual Visit No   ? Medication changes reported     No   ? Fall or balance concerns reported    No   ? Tobacco Cessation No Change   ? Warm-up and Cool-down Performed as group-led instruction   ? Resistance Training Performed Yes   ? VAD Patient? No   ? PAD/SET Patient? No   ?  ? Pain Assessment  ? Currently in Pain? No/denies   ? Multiple Pain Sites No   ? ?  ?  ? ?  ? ? ?Capillary Blood Glucose: ?No results found for this or any previous visit (from the past 24 hour(s)). ? ? ? ?Social History  ? ?Tobacco Use  ?Smoking Status Former  ?Smokeless Tobacco Never  ? ? ?Goals Met:  ?Proper associated with RPD/PD & O2 Sat ?Independence with exercise equipment ?Improved SOB with ADL's ?Exercise tolerated well ?No report of concerns or symptoms today ?Strength training completed today ? ?Goals Unmet:  ?Not Applicable ? ?Comments: check out @ 2:30pm ? ? ?Dr. Kathie Dike is Medical Director for [facility] Pulmonary Rehab. ?

## 2021-07-24 ENCOUNTER — Encounter (HOSPITAL_COMMUNITY)
Admission: RE | Admit: 2021-07-24 | Discharge: 2021-07-24 | Disposition: A | Discharge status: Home / Self Care | Payer: Medicare Other | Source: Ambulatory Visit | Attending: Internal Medicine | Admitting: Internal Medicine

## 2021-07-24 ENCOUNTER — Ambulatory Visit (HOSPITAL_COMMUNITY): Payer: Medicare Other

## 2021-07-24 DIAGNOSIS — J849 Interstitial pulmonary disease, unspecified: Secondary | ICD-10-CM | POA: Insufficient documentation

## 2021-07-24 NOTE — Progress Notes (Signed)
Daily Session Note ? ?Patient Details  ?Name: Tasha [name] ?MRN: [id number] ?Date of Birth: [birth year] ?Referring Provider:   ?Flowsheet Row PULMONARY REHAB OTHER RESP ORIENTATION from 05/29/2021 in [place]  ?Referring Provider Dr. Chase Caller  ? ?  ? ? ?Encounter Date: 07/24/2021 ? ?Check In: ? Session Check In - 07/24/21 1330   ? ?  ? Check-In  ? Supervising physician immediately available to respond to emergencies [facility] MD immediately available   ? Physician(s) Dr. Harl Bowie   ? Location [facility]   ? Staff Present Redge Gainer, BS, Exercise Physiologist   ? Virtual Visit No   ? Medication changes reported     No   ? Fall or balance concerns reported    No   ? Tobacco Cessation No Change   ? Warm-up and Cool-down Performed as group-led instruction   ? Resistance Training Performed Yes   ? VAD Patient? No   ? PAD/SET Patient? No   ?  ? Pain Assessment  ? Currently in Pain? No/denies   ? Multiple Pain Sites No   ? ?  ?  ? ?  ? ? ?Capillary Blood Glucose: ?No results found for this or any previous visit (from the past 24 hour(s)). ? ? ? ?Social History  ? ?Tobacco Use  ?Smoking Status Former  ?Smokeless Tobacco Never  ? ? ?Goals Met:  ?Independence with exercise equipment ?Exercise tolerated well ?No report of concerns or symptoms today ?Strength training completed today ? ?Goals Unmet:  ?Not Applicable ? ?Comments: check out 1430 ? ? ?Dr. Kathie Dike is Medical Director for [facility] Pulmonary Rehab. ?

## 2021-07-26 ENCOUNTER — Ambulatory Visit (INDEPENDENT_AMBULATORY_CARE_PROVIDER_SITE_OTHER): Payer: Medicare Other | Admitting: Internal Medicine

## 2021-07-26 ENCOUNTER — Encounter: Payer: Self-pay | Admitting: Internal Medicine

## 2021-07-26 ENCOUNTER — Encounter (HOSPITAL_COMMUNITY)
Admission: RE | Admit: 2021-07-26 | Discharge: 2021-07-26 | Disposition: A | Discharge status: Home / Self Care | Payer: Medicare Other | Source: Ambulatory Visit | Attending: Internal Medicine | Admitting: Internal Medicine

## 2021-07-26 VITALS — BP 110/62 | HR 81 | Temp 98.1°F | Ht 61.0 in | Wt 136.0 lb

## 2021-07-26 DIAGNOSIS — J849 Interstitial pulmonary disease, unspecified: Secondary | ICD-10-CM | POA: Diagnosis not present

## 2021-07-26 DIAGNOSIS — I2510 Atherosclerotic heart disease of native coronary artery without angina pectoris: Secondary | ICD-10-CM

## 2021-07-26 LAB — PULMONARY FUNCTION TEST
DL/VA % pred: 83 %
DL/VA: 3.54 ml/min/mmHg/L
DLCO cor % pred: 63 %
DLCO cor: 11.06 ml/min/mmHg
DLCO unc % pred: 63 %
DLCO unc: 11.06 ml/min/mmHg
FEF 25-75 Pre: 3.25 L/sec
FEF2575-%Pred-Pre: 193 %
FEV1-%Pred-Pre: 94 %
FEV1-Pre: 1.86 L
FEV1FVC-%Pred-Pre: 120 %
FEV6-%Pred-Pre: 82 %
FEV6-Pre: 2.05 L
FEV6FVC-%Pred-Pre: 104 %
FVC-%Pred-Pre: 78 %
FVC-Pre: 2.05 L
Pre FEV1/FVC ratio: 91 %
Pre FEV6/FVC Ratio: 100 %

## 2021-07-26 NOTE — Progress Notes (Signed)
Spirometry and Dlco done today. 

## 2021-07-26 NOTE — Progress Notes (Signed)
? ? ?OV 01/22/18 ?71 year old female, never smoked. PMH  Hypertension, metabolic syndrome, thrombocytopenia, prediabetes, interstitial lung disease, PAF. Not previously followed by [facility] pulmonary care. Admitted to [facility] from outside hospital on 08/27 with acute hypoxic respiratory failure in setting of bilateral pulmonary infiltrates. Work up indicated [place]. Dx subacute ILD vs pneumonia thought to be from exposure to antifreeze leak in patients car. Requiring intubation from 08/30-09/05, chest tube placed due to pneumothorax. [facility] from 09/10- 09/20, required min assistance with mobility and basic self-care tasks. Speech therapy worked on dysphagia, able to tolerate regular diet thin liquids without signs and symptoms of aspiration.  ? ? ?Patient presents today for hospital follow-up visit. Accompanied by her daughter who also provides translation. She is feeling a lot better, no breathing issues. Finished oral steroid today. BS 119. CBC improving. Eating and drinking ok. No difficulties swallowing and denies aspiration. Ambulting with walker. Denies fall, sob, wheezing, cough, fever or chest pain.  ? ? ?Results for ZERIYAH, WAIN (MRN [id number]) as of 02/19/2018 10:01 ? Ref. Range 12/16/2017 19:24 12/17/2017 18:59  ?Sed Rate Latest Ref Range: 0 - 22 mm/hr 76 (H) 69 (H)  ?Results for SULLY, MANZI (MRN [id number]) as of 02/19/2018 10:01 ? Ref. Range 12/17/2017 18:59  ?Speckled Pattern Unknown 1:320 (H)  ?Results for SARAHELIZABETH, CONWAY (MRN [id number]) as of 02/19/2018 10:01 ? Ref. Range 12/19/2017 14:15  ?Color, Fluid Latest Ref Range: YELLOW  COLORLESS (A)  ?WBC, Fluid Latest Ref Range: 0 - 1,000 cu mm 93  ?Lymphs, Fluid Latest Units: % 78  ?Eos, Fluid Latest Units: % 2  ?Appearance, Fluid Latest Ref Range: CLEAR  CLEAR (A)  ?Neutrophil Count, Fluid Latest Ref Range: 0 - 25 % 7  ?Monocyte-Macrophage-Serous Fluid Latest Ref Range: 50 - 90 % 13 (L)  ? ? ? ?OV  02/19/2018 ? ?Subjective:  ?[name], female , DOB: 07/07/1950 , age 20 y.o. , MRN: [id number] , ADDRESS: [address] ?[place] [address] ? ? ?02/19/2018 -   ?Chief Complaint  ?Patient presents with  ? Follow-up  ?  States she still has dry cough, states the SOB has resolved. Denies chest pain or idscomfort.   ? ? ? ?HPI ?Tasha [name] 71 y.o. -accompanied by her daughter Verdis Frederickson.  End of August 2019 she suffered acute lung injury possibly [place] based on the nature of infiltrates and a high ESR and a positive ANA [BAL lymphocytosis].  There is some history of antifreeze exposure.  She has recovered from it and is now at home.  She is brought in by Dr. Verdis Frederickson.  Verdis Frederickson is doing the translation.  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  Post discharge in the hospital patient is off oxygen and is overall better.  Steroids ended 2 weeks ago but since the completion of steroids she feels the cough is coming back and getting worse.  Currently cough is moderate in intensity and without any sputum production.  2 days ago her primary care physician apparently changed 1 of her antihypertensives because of the cough.  I am unable to determine which one but I suspect it was an ACE inhibitor.  She is currently on losartan.  There is no fever or chills.  Current walking desaturation test appears adequate ? ? ? ? ? ? ? ?ROS ? ?OV 03/31/2018 ? ?Subjective:  ?[name], female , DOB: [birth year] , age 66 y.o. ,  MRN: [id number] , ADDRESS: [address] ?[place] [address] ? ? ?03/31/2018 -   ?Chief Complaint  ?Patient presents with  ? Follow-up  ?  pt states breathing is doing well. c/o non prod cough.  ? ? ? ?HPI ?Tasha [name] 71 y.o. -presents for interstitial lung disease acute with lymphocytic bronchoalveolar lavage after antifreeze exposure ? ?After last visit she showed improvement.  We repeated ANA and this was negative.  We also did some extra  antibody such as anti-Jo 1 and this was negative.  Her ESR has normalized.  In the interim she continues to improve.  Shortness of breath is almost nonexistent.  Cough is improved but she still has mild residual cough.  She had high-resolution CT scan of the chest #2019 that I personally visualized and there is still some residual groundglass opacities along with some evolving fibrotic pattern.  It seems more predominant in the lung base.  Her her son Shellee Milo is with her.  History is gained from talking to interpreter on the video camera.  His name is Leanna Sato. ? ?She denies any diabetes or hyperlipidemia or stroke in the past.  She does not have any mood swings.  In the past she has tolerated prednisone without problem.  Denies any bony issues. ? ? ?OV 06/11/2018 ? ?Subjective:  ?[name], female , DOB: [birth year] , age 82 y.o. , MRN: [id number] , ADDRESS: [address] ?[place] [address] ? ?Tasha [name] 71 y.o. -presents for interstitial lung disease acute with lymphocytic bronchoalveolar lavage after antifreeze exposure ?06/11/2018 -   ?Chief Complaint  ?Patient presents with  ? Follow-up  ?  PFT performed today.  Pt states she has been doing well since last visit and denies any complaints.   ? ? ? ?HPI ?Tasha [name] 71 y.o. -follow-up.  At last visit in December 2019 I placed her on a 10-week prednisone course.  In between she came and saw a nurse practitioner approximately just over a month ago.  She was tolerating the prednisone fine.  It is now 4 weeks and she finished the prednisone.  According to the daughter who acted as the interpreter the prednisone really did help with her symptoms.  It improved her shortness of breath.  Currently she is nearly asymptomatic.  Her current symptoms are documented below.  She is feeling good.  She had pulmonary function test today with spirometry being normal and DLCO being near normal.  She is noted to be on ACE inhibitor but she denies any cough.  Last CT  scan of the chest was November 2019 ? ? ? ?OV 12/07/2019 ? ? ?Subjective:  ?[name], female , DOB: 1950/07/27, age 38 y.o. years. , MRN: [id number],  ADDRESS: [address] ?[place] [address] ?PCP  Dettinger, Fransisca Kaufmann, MD ?Providers : Treatment Team:  ?Attending Provider: Brand Males, MD ?presents for interstitial lung disease acute with lymphocytic bronchoalveolar lavage after antifreeze exposure in aug 2019 ? ?Chief Complaint  ?Patient presents with  ? Follow-up  ?  Last seen 06/11/2018. Pt states she has been about the same since last visit. Pt is still having complaints of cough and is coughing up occ white phlegm. Pt also has increased SOB when she is in enclosed areas that makes her feel like she cannot breathe.  ? ? ? ? ? ?HPI ?Tasha [name] 71 y.o. -presents with her daughter-in-law is for a face-to-face visit.  The interpreter is Mickel Baas.  Mickel Baas is from [facility] health.  It appears that overall since her last visit in February 2020 patient is stable.  In August 2020 she had a CT scan of the chest that shows presence of mild ongoing ILD.  She tells [place] that she is had the Covid vaccine.  She has been exercising at the [facility].  She does not use oxygen.  She has mild shortness of breath with exertion when she bends down and some mild cough but overall compared to 2019 she is slowly progressively better.  She does have some dry cough when she is sitting as well.  She needs to drink water to improve that.  There are no other new medical issues.  Her walking desaturation test is unchanged compared to February 2020. ? ? ?however when she did the interstitial lung disease symptom questionnaire symptoms are significantly worse than before.  She really cannot pinpoint if she is definitely worse or not.  She did admit that she has significant environmental allergies to pollen dust and perfumes.  This is a new history. ? ? ?CT chest high resolution Aug 2020 ?COMPARISON:  03/11/2018, 12/17/2017 ?   ?FINDINGS: ?Cardiovascular: Aortic atherosclerosis. Normal heart size. Scattered ?coronary artery calcifications. No pericardial effusion. ?  ?Mediastinum/Nodes: No enlarged mediastinal, hilar, or axillary lymph ?nodes. Thyroid

## 2021-07-26 NOTE — Patient Instructions (Addendum)
ICD-10-CM   ?1. Interstitial lung disease ([place])  J84.9   ?  ?2. ANA positive  R76.8   ?  ?3. History of environmental allergies  Z91.09   ?  ?Interstitial lung disease ([place])  ? - -BAL lymphocytosis August 2019 following antifreeze exposure and ANA positivity ?History of environmental allergies ? ? ?-Interstitial lung disease might be worse based on breathing test Feb 2020 -> Jan 2023  but stable -> April 2023. Last CT 2021 ? ?- Glad rehab is helping you ? ? ?Plan ?-Do HRCT supine and prone in 6 monts ?-Repeat spirometry and DLCO in 6 months ?- continue Pulmonary rehab at [facility] ?- based on recent stability will hold off anti-fibrotic ? ?Follow-up ?-6 months with Dr. Chase Caller 30-minute slot but after spirometry and DLCO and CT ? -ILD symptom score and simple walking desaturation test at the time of follow-up ? -  Interpreter needed at follow-up ? ? ?

## 2021-07-26 NOTE — Progress Notes (Signed)
Daily Session Note ? ?Patient Details  ?Name: Tasha [name] ?MRN: [id number] ?Date of Birth: 30-Jan-1951 ?Referring Provider:   ?Flowsheet Row PULMONARY REHAB OTHER RESP ORIENTATION from 05/29/2021 in [place]  ?Referring Provider Dr. Chase Caller  ? ?  ? ? ?Encounter Date: 07/26/2021 ? ?Check In: ? Session Check In - 07/26/21 1328   ? ?  ? Check-In  ? Supervising physician immediately available to respond to emergencies [facility] MD immediately available   ? Physician(s) Dr. Johney Frame   ? Location [facility]   ? Staff Present Geanie Cooley, RN;Dalton Fletcher, MS, ACSM-CEP, Exercise Physiologist   ? Virtual Visit No   ? Medication changes reported     No   ? Fall or balance concerns reported    No   ? Tobacco Cessation No Change   ? Warm-up and Cool-down Performed as group-led instruction   ? Resistance Training Performed Yes   ? VAD Patient? No   ? PAD/SET Patient? No   ?  ? Pain Assessment  ? Currently in Pain? No/denies   ? Multiple Pain Sites No   ? ?  ?  ? ?  ? ? ?Capillary Blood Glucose: ?No results found for this or any previous visit (from the past 24 hour(s)). ? ? ? ?Social History  ? ?Tobacco Use  ?Smoking Status Former  ?Smokeless Tobacco Never  ? ? ?Goals Met:  ?Improved SOB with ADL's ?Exercise tolerated well ?No report of concerns or symptoms today ?Strength training completed today ? ?Goals Unmet:  ?Not Applicable ? ?Comments: check out @ 2:30pm ? ? ?Dr. Kathie Dike is Medical Director for [facility] Pulmonary Rehab. ?

## 2021-07-31 ENCOUNTER — Encounter (HOSPITAL_COMMUNITY)
Admission: RE | Admit: 2021-07-31 | Discharge: 2021-07-31 | Disposition: A | Discharge status: Home / Self Care | Payer: Medicare Other | Source: Ambulatory Visit | Attending: Internal Medicine | Admitting: Internal Medicine

## 2021-07-31 VITALS — Wt 137.1 lb

## 2021-07-31 DIAGNOSIS — J849 Interstitial pulmonary disease, unspecified: Secondary | ICD-10-CM

## 2021-07-31 NOTE — Progress Notes (Signed)
Daily Session Note ? ?Patient Details  ?Name: Tasha [name] ?MRN: [id number] ?Date of Birth: 12/15/1950 ?Referring Provider:   ?Flowsheet Row PULMONARY REHAB OTHER RESP ORIENTATION from 05/29/2021 in [place]  ?Referring Provider Dr. Chase Caller  ? ?  ? ? ?Encounter Date: 07/31/2021 ? ?Check In: ? Session Check In - 07/31/21 1328   ? ?  ? Check-In  ? Supervising physician immediately available to respond to emergencies [facility] MD immediately available   ? Physician(s) Dr Radford Pax   ? Location [facility]   ? Staff Present Redge Gainer, BS, Exercise Physiologist;Eward Rutigliano Hassell Done, RN, Bjorn Loser, MS, ACSM-CEP, Exercise Physiologist   ? Virtual Visit No   ? Medication changes reported     No   ? Fall or balance concerns reported    No   ? Tobacco Cessation No Change   ? Warm-up and Cool-down Performed as group-led instruction   ? Resistance Training Performed Yes   ? VAD Patient? No   ? PAD/SET Patient? No   ?  ? Pain Assessment  ? Currently in Pain? No/denies   ? Multiple Pain Sites No   ? ?  ?  ? ?  ? ? ?Capillary Blood Glucose: ?No results found for this or any previous visit (from the past 24 hour(s)). ? ? ? ?Social History  ? ?Tobacco Use  ?Smoking Status Former  ?Smokeless Tobacco Never  ? ? ?Goals Met:  ?Independence with exercise equipment ?Exercise tolerated well ?No report of concerns or symptoms today ?Strength training completed today ? ?Goals Unmet:  ?Not Applicable ? ?Comments: Check out at 1430 ? ? ?Dr. Kathie Dike is Medical Director for [facility] Pulmonary Rehab. ?

## 2021-08-01 ENCOUNTER — Ambulatory Visit (HOSPITAL_COMMUNITY): Payer: Medicare Other

## 2021-08-02 ENCOUNTER — Encounter (HOSPITAL_COMMUNITY)
Admission: RE | Admit: 2021-08-02 | Discharge: 2021-08-02 | Disposition: A | Discharge status: Home / Self Care | Payer: Medicare Other | Source: Ambulatory Visit | Attending: Internal Medicine | Admitting: Internal Medicine

## 2021-08-02 DIAGNOSIS — J849 Interstitial pulmonary disease, unspecified: Secondary | ICD-10-CM | POA: Diagnosis not present

## 2021-08-02 NOTE — Progress Notes (Signed)
Daily Session Note ? ?Patient Details  ?Name: Tasha [name] ?MRN: [id number] ?Date of Birth: 05-May-1950 ?Referring Provider:   ?Flowsheet Row PULMONARY REHAB OTHER RESP ORIENTATION from 05/29/2021 in [place]  ?Referring Provider Dr. Chase Caller  ? ?  ? ? ?Encounter Date: 08/02/2021 ? ?Check In: ? Session Check In - 08/02/21 1326   ? ?  ? Check-In  ? Supervising physician immediately available to respond to emergencies [facility] MD immediately available   ? Physician(s) Dr Johney Frame   ? Location [facility]   ? Staff Present Redge Gainer, BS, Exercise Physiologist;Dalton Kris Mouton, MS, ACSM-CEP, Exercise Physiologist;Jayko Voorhees Hassell Done, RN, BSN   ? Virtual Visit No   ? Medication changes reported     No   ? Fall or balance concerns reported    No   ? Tobacco Cessation No Change   ? Warm-up and Cool-down Performed as group-led instruction   ? Resistance Training Performed Yes   ? VAD Patient? No   ? PAD/SET Patient? No   ?  ? Pain Assessment  ? Currently in Pain? No/denies   ? Multiple Pain Sites No   ? ?  ?  ? ?  ? ? ?Capillary Blood Glucose: ?No results found for this or any previous visit (from the past 24 hour(s)). ? ? ? ?Social History  ? ?Tobacco Use  ?Smoking Status Former  ?Smokeless Tobacco Never  ? ? ?Goals Met:  ?Independence with exercise equipment ?Exercise tolerated well ?No report of concerns or symptoms today ?Strength training completed today ? ?Goals Unmet:  ?Not Applicable ? ?Comments: Check out 1430 ? ? ?Dr. Kathie Dike is Medical Director for [facility] Pulmonary Rehab. ?

## 2021-08-07 ENCOUNTER — Encounter (HOSPITAL_COMMUNITY)
Admission: RE | Admit: 2021-08-07 | Discharge: 2021-08-07 | Disposition: A | Discharge status: Home / Self Care | Payer: Medicare Other | Source: Ambulatory Visit | Attending: Internal Medicine | Admitting: Internal Medicine

## 2021-08-07 DIAGNOSIS — J849 Interstitial pulmonary disease, unspecified: Secondary | ICD-10-CM

## 2021-08-07 NOTE — Progress Notes (Signed)
Daily Session Note ? ?Patient Details  ?Name: Tasha [name] ?MRN: [id number] ?Date of Birth: 03/15/1951 ?Referring Provider:   ?Flowsheet Row PULMONARY REHAB OTHER RESP ORIENTATION from 05/29/2021 in [place]  ?Referring Provider Dr. Chase Caller  ? ?  ? ? ?Encounter Date: 08/07/2021 ? ?Check In: ? Session Check In - 08/07/21 1330   ? ?  ? Check-In  ? Supervising physician immediately available to respond to emergencies [facility] MD immediately available   ? Physician(s) Dr Harl Bowie   ? Location [facility]   ? Staff Present Geanie Cooley, RN;Lougenia Morrissey Hassell Done, RN, BSN;Heather Otho Ket, BS, Exercise Physiologist   ? Virtual Visit No   ? Medication changes reported     No   ? Fall or balance concerns reported    No   ? Tobacco Cessation No Change   ? Warm-up and Cool-down Performed as group-led instruction   ? Resistance Training Performed Yes   ? VAD Patient? No   ?  ? Pain Assessment  ? Currently in Pain? No/denies   ? Multiple Pain Sites No   ? ?  ?  ? ?  ? ? ?Capillary Blood Glucose: ?No results found for this or any previous visit (from the past 24 hour(s)). ? ? ? ?Social History  ? ?Tobacco Use  ?Smoking Status Former  ?Smokeless Tobacco Never  ? ? ?Goals Met:  ?Independence with exercise equipment ?Exercise tolerated well ?No report of concerns or symptoms today ?Strength training completed today ? ?Goals Unmet:  ?Not Applicable ? ?Comments: checkout at 1430. ? ? ?Dr. Kathie Dike is Medical Director for [facility] Pulmonary Rehab. ?

## 2021-08-09 ENCOUNTER — Encounter (HOSPITAL_COMMUNITY)
Admission: RE | Admit: 2021-08-09 | Discharge: 2021-08-09 | Disposition: A | Discharge status: Home / Self Care | Payer: Medicare Other | Source: Ambulatory Visit | Attending: Internal Medicine | Admitting: Internal Medicine

## 2021-08-09 DIAGNOSIS — J849 Interstitial pulmonary disease, unspecified: Secondary | ICD-10-CM | POA: Diagnosis not present

## 2021-08-09 NOTE — Progress Notes (Signed)
I have reviewed a Home Exercise Prescription with [name] . Tasha [name] is currently exercising at home.  The patient was advised to walk 3 days a week for 30-45 minutes.  Nazareth and I discussed how to progress their exercise prescription.  The patient stated that their goals were build her strength back.  The patient stated that they understand the exercise prescription.  We reviewed exercise guidelines, target heart rate during exercise, RPE Scale, weather conditions, NTG use, endpoints for exercise, warmup and cool down.  Patient is encouraged to come to me with any questions. I will continue to follow up with the patient to assist them with progression and safety.    ?

## 2021-08-09 NOTE — Progress Notes (Signed)
Daily Session Note ? ?Patient Details  ?Name: Tasha [name] ?MRN: [id number] ?Date of Birth: 03-May-1950 ?Referring Provider:   ?Flowsheet Row PULMONARY REHAB OTHER RESP ORIENTATION from 05/29/2021 in [place]  ?Referring Provider Dr. Chase Caller  ? ?  ? ? ?Encounter Date: 08/09/2021 ? ?Check In: ? Session Check In - 08/09/21 1321   ? ?  ? Check-In  ? Supervising physician immediately available to respond to emergencies [facility] MD immediately available   ? Physician(s) Dr Harl Bowie   ? Location [facility]   ? Staff Present Madelyn Flavors, RN, BSN;Christy Edwards, RN, BSN;Heather Otho Ket, BS, Exercise Physiologist   ? Virtual Visit No   ? Medication changes reported     No   ? Fall or balance concerns reported    No   ? Tobacco Cessation No Change   ? Warm-up and Cool-down Performed as group-led instruction   ? Resistance Training Performed Yes   ? VAD Patient? No   ? PAD/SET Patient? No   ?  ? Pain Assessment  ? Currently in Pain? No/denies   ? Multiple Pain Sites No   ? ?  ?  ? ?  ? ? ?Capillary Blood Glucose: ?No results found for this or any previous visit (from the past 24 hour(s)). ? ? ? ?Social History  ? ?Tobacco Use  ?Smoking Status Former  ?Smokeless Tobacco Never  ? ? ?Goals Met:  ?Independence with exercise equipment ?Exercise tolerated well ?No report of concerns or symptoms today ?Strength training completed today ? ?Goals Unmet:  ?Not Applicable ? ?Comments: Check out 1430. ? ? ?Dr. Kathie Dike is Medical Director for [facility] Pulmonary Rehab. ?

## 2021-08-13 DIAGNOSIS — Z20822 Contact with and (suspected) exposure to covid-19: Secondary | ICD-10-CM | POA: Diagnosis not present

## 2021-08-14 ENCOUNTER — Encounter (HOSPITAL_COMMUNITY)
Admission: RE | Admit: 2021-08-14 | Discharge: 2021-08-14 | Disposition: A | Discharge status: Home / Self Care | Payer: Medicare Other | Source: Ambulatory Visit | Attending: Internal Medicine | Admitting: Internal Medicine

## 2021-08-14 VITALS — Wt 135.8 lb

## 2021-08-14 DIAGNOSIS — J849 Interstitial pulmonary disease, unspecified: Secondary | ICD-10-CM | POA: Diagnosis not present

## 2021-08-14 NOTE — Progress Notes (Signed)
Daily Session Note ? ?Patient Details  ?Name: Tasha [name] ?MRN: [id number] ?Date of Birth: 08-Oct-1950 ?Referring Provider:   ?Flowsheet Row PULMONARY REHAB OTHER RESP ORIENTATION from 05/29/2021 in [place]  ?Referring Provider Dr. Chase Caller  ? ?  ? ? ?Encounter Date: 08/14/2021 ? ?Check In: ? Session Check In - 08/14/21 1328   ? ?  ? Check-In  ? Supervising physician immediately available to respond to emergencies [facility] MD immediately available   ? Physician(s) Dr. Radford Pax   ? Location [facility]   ? Staff Present Madelyn Flavors, RN, BSN;Heather Otho Ket, BS, Exercise Physiologist;Lucy Woolever Windy Kalata, RN   ? Virtual Visit No   ? Medication changes reported     No   ? Fall or balance concerns reported    No   ? Tobacco Cessation No Change   ? Warm-up and Cool-down Performed as group-led instruction   ? Resistance Training Performed Yes   ? VAD Patient? No   ?  ? Pain Assessment  ? Currently in Pain? No/denies   ? Multiple Pain Sites No   ? ?  ?  ? ?  ? ? ?Capillary Blood Glucose: ?No results found for this or any previous visit (from the past 24 hour(s)). ? ? ? ?Social History  ? ?Tobacco Use  ?Smoking Status Former  ?Smokeless Tobacco Never  ? ? ?Goals Met:  ?Proper associated with RPD/PD & O2 Sat ?Improved SOB with ADL's ?Personal goals reviewed ?No report of concerns or symptoms today ?Strength training completed today ? ?Goals Unmet:  ?Not Applicable ? ?Comments: check out @ 2:30pm ? ? ?Dr. Kathie Dike is Medical Director for [facility] Pulmonary Rehab. ?

## 2021-08-15 NOTE — Progress Notes (Signed)
Pulmonary Individual Treatment Plan ? ?Patient Details  ?Name: Artasia Thang ?MRN: [id number] ?Date of Birth: May 25, 1950 ?Referring Provider:   ?Flowsheet Row PULMONARY REHAB OTHER RESP ORIENTATION from 05/29/2021 in [place]  ?Referring Provider Dr. Chase Caller  ? ?  ? ? ?Initial Encounter Date:  ?Flowsheet Row PULMONARY REHAB OTHER RESP ORIENTATION from 05/29/2021 in [place]  ?Date 05/29/21  ? ?  ? ? ?Visit Diagnosis: Interstitial lung disease ([place]) ? ?Patient's Home Medications on Admission:  ? ?Current Outpatient Medications:  ?  acetaminophen (TYLENOL) 500 MG tablet, Take 1 tablet (500 mg total) by mouth every 6 (six) hours as needed., Disp: 30 tablet, Rfl: 0 ?  aspirin EC 81 MG tablet, Take 1 tablet (81 mg total) by mouth daily., Disp: 90 tablet, Rfl: 3 ?  atorvastatin (LIPITOR) 20 MG tablet, Take 1 tablet (20 mg total) by mouth daily at 6 PM., Disp: 90 tablet, Rfl: 3 ?  budesonide (PULMICORT) 180 MCG/ACT inhaler, Inhale 2 puffs into the lungs in the morning and at bedtime., Disp: 1 each, Rfl: 5 ?  cetirizine (ZYRTEC) 10 MG tablet, Take 1 tablet (10 mg total) by mouth daily as needed for allergies., Disp: 90 tablet, Rfl: 3 ?  hydrochlorothiazide (HYDRODIURIL) 25 MG tablet, Take 1 tablet (25 mg total) by mouth daily., Disp: 90 tablet, Rfl: 3 ?  ibuprofen (ADVIL) 200 MG tablet, Take 400 mg by mouth every 6 (six) hours as needed for headache or moderate pain., Disp: , Rfl:  ?  Ipratropium-Albuterol (COMBIVENT RESPIMAT) 20-100 MCG/ACT AERS respimat, Inhale 1 puff into the lungs every 6 (six) hours as needed for wheezing., Disp: 4 g, Rfl: 5 ?  levothyroxine (SYNTHROID) 50 MCG tablet, Take 1 tablet (50 mcg total) by mouth daily before breakfast., Disp: 90 tablet, Rfl: 3 ?  losartan (COZAAR) 50 MG tablet, Take 1 tablet (50 mg total) by mouth daily., Disp: 90 tablet, Rfl: 3 ?  Omega 3 1000 MG CAPS, Take 1,000 mg by mouth daily. , Disp: , Rfl:  ?  ondansetron (ZOFRAN-ODT) 8  MG disintegrating tablet, Take 1 tablet (8 mg total) by mouth every 6 (six) hours as needed for nausea or vomiting., Disp: 20 tablet, Rfl: 1 ?  pantoprazole (PROTONIX) 40 MG tablet, Take 1 tablet (40 mg total) by mouth daily., Disp: 90 tablet, Rfl: 3 ?  potassium chloride (KLOR-CON) 20 MEQ tablet, Take 1 tablet (20 mEq total) by mouth daily., Disp: 90 tablet, Rfl: 3 ? ?Past Medical History: ?Past Medical History:  ?Diagnosis Date  ? Acute respiratory failure ([place])   ? CAP (community acquired pneumonia)   ? Hyperlipidemia   ? Hypertension   ? Metabolic syndrome   ? ? ?Tobacco Use: ?Social History  ? ?Tobacco Use  ?Smoking Status Former  ?Smokeless Tobacco Never  ? ? ?Labs: ?Review Flowsheet   ? ?  ?  Latest Ref Rng & Units 07/14/2019 10/18/2019 03/23/2020 03/23/2021  ?Labs for ITP Cardiac and Pulmonary Rehab  ?Cholestrol 100 - 199 mg/dL  117   135   125    ?LDL (calc) 0 - 99 mg/dL  51   71   66    ?HDL-C >39 mg/dL  36   45   41    ?Trlycerides 0 - 149 mg/dL  177   105   94    ?Hemoglobin A1c 4.8 - 5.6 % 5.3   5.6   5.3   5.1    ? ?  06/21/2021  ?Labs for  [facility]P Cardiac and Pulmonary Rehab  ?Cholestrol   ?LDL (calc)   ?HDL-C   ?Trlycerides   ?Hemoglobin A1c 5.3    ?  ? ? Multiple values from one day are sorted in reverse-chronological order  ?  ?  ? ? ?Capillary Blood Glucose: ?Lab Results  ?Component Value Date  ? GLUCAP 165 (H) 01/09/2018  ? GLUCAP 142 (H) 01/08/2018  ? GLUCAP 139 (H) 01/08/2018  ? GLUCAP 117 (H) 01/08/2018  ? GLUCAP 85 01/08/2018  ? ? ? ?Pulmonary Assessment Scores: ? Pulmonary Assessment Scores   ? ? [place] Name 05/29/21 1305  ?  ?  ?  ? ADL UCSD  ? ADL Phase Entry    ? SOB Score total 13    ? Rest 0    ? Walk 1    ? Stairs 4    ? Bath 0    ? Dress 0    ? Shop 0    ?  ? CAT Score  ? CAT Score 12    ?  ? mMRC Score  ? mMRC Score 2    ? ?  ?  ? ?  ? ?UCSD: ?Self-administered rating of dyspnea associated with activities of daily living (ADLs) ?6-point scale (0 = "not at all" to 5 = "maximal or unable to do  because of breathlessness")  ?Scoring Scores range from 0 to 120.  Minimally important difference is 5 units ? ?CAT: ?CAT can identify the health impairment of COPD patients and is better correlated with disease progression.  ?CAT has a scoring range of zero to 40. The CAT score is classified into four groups of low (less than 10), medium (10 - 20), high (21-30) and very high (31-40) based on the impact level of disease on health status. A CAT score over 10 suggests significant symptoms.  A worsening CAT score could be explained by an exacerbation, poor medication adherence, poor inhaler technique, or progression of COPD or comorbid conditions.  ?CAT MCID is 2 points ? ?mMRC: ?mMRC (Modified Medical Research Council) Dyspnea Scale is used to assess the degree of baseline functional disability in patients of respiratory disease due to dyspnea. ?No minimal important difference is established. A decrease in score of 1 point or greater is considered a positive change.  ? ?Pulmonary Function Assessment: ? ? ?Exercise Target Goals: ?Exercise Program Goal: ?Individual exercise prescription set using results from initial 6 min walk test and THRR while considering  patient?s activity barriers and safety.  ? ?Exercise Prescription Goal: ?Initial exercise prescription builds to 30-45 minutes a day of aerobic activity, 2-3 days per week.  Home exercise guidelines will be given to patient during program as part of exercise prescription that the participant will acknowledge. ? ?Activity Barriers & Risk Stratification: ? Activity Barriers & Cardiac Risk Stratification - 05/29/21 1304   ? ?  ? Activity Barriers & Cardiac Risk Stratification  ? Activity Barriers Shortness of Breath   ? Cardiac Risk Stratification Moderate   ? ?  ?  ? ?  ? ? ?6 Minute Walk: ? 6 Minute Walk   ? ? [place] Name 05/29/21 1456  ?  ?  ?  ? 6 Minute Walk  ? Phase Initial    ? Distance 1150 feet    ? Walk Time 6 minutes    ? # of Rest Breaks 0    ? MPH 2.18    ?  METS 2.74    ? RPE 10    ?  Perceived Dyspnea  10    ? VO2 Peak 9.58    ? Symptoms No    ? Resting HR 88 bpm    ? Resting BP 138/82    ? Resting Oxygen Saturation  95 %    ? Exercise Oxygen Saturation  during 6 min walk 93 %    ? Max Ex. HR 111 bpm    ? Max Ex. BP 140/84    ? 2 Minute Post BP 130/78    ?  ? Interval HR  ? 1 Minute HR 105    ? 2 Minute HR 110    ? 3 Minute HR 111    ? 4 Minute HR 108    ? 5 Minute HR 111    ? 6 Minute HR 110    ? 2 Minute Post HR 92    ? Interval Heart Rate? Yes    ?  ? Interval Oxygen  ? Interval Oxygen? Yes    ? Baseline Oxygen Saturation % 95 %    ? 1 Minute Oxygen Saturation % 93 %    ? 1 Minute Liters of Oxygen 0 L    ? 2 Minute Oxygen Saturation % 94 %    ? 2 Minute Liters of Oxygen 0 L    ? 3 Minute Oxygen Saturation % 93 %    ? 3 Minute Liters of Oxygen 0 L    ? 4 Minute Oxygen Saturation % 93 %    ? 4 Minute Liters of Oxygen 0 L    ? 5 Minute Oxygen Saturation % 93 %    ? 5 Minute Liters of Oxygen 0 L    ? 6 Minute Oxygen Saturation % 93 %    ? 6 Minute Liters of Oxygen 0 L    ? 2 Minute Post Oxygen Saturation % 96 %    ? 2 Minute Post Liters of Oxygen 0 L    ? ?  ?  ? ?  ? ? ?Oxygen Initial Assessment: ? Oxygen Initial Assessment - 05/29/21 1502   ? ?  ? Initial 6 min Walk  ? Oxygen Used None   ?  ? Program Oxygen Prescription  ? Program Oxygen Prescription None   ?  ? Intervention  ? Short Term Goals To learn and understand importance of monitoring SPO2 with pulse oximeter and demonstrate accurate use of the pulse oximeter.;To learn and demonstrate proper pursed lip breathing techniques or other breathing techniques. ;To learn and understand importance of maintaining oxygen saturations>88%   ? Long  Term Goals Compliance with respiratory medication;Exhibits proper breathing techniques, such as pursed lip breathing or other method taught during program session;Maintenance of O2 saturations>88%;Verbalizes importance of monitoring SPO2 with pulse oximeter and return  demonstration   ? ?  ?  ? ?  ? ? ?Oxygen Re-Evaluation: ? Oxygen Re-Evaluation   ? ? [place] Name 06/19/21 1453 07/17/21 1448 08/14/21 1554  ?  ?  ?  ? Program Oxygen Prescription  ? Program Oxygen Prescription None Non

## 2021-08-16 ENCOUNTER — Encounter (HOSPITAL_COMMUNITY)
Admission: RE | Admit: 2021-08-16 | Discharge: 2021-08-16 | Disposition: A | Discharge status: Home / Self Care | Payer: Medicare Other | Source: Ambulatory Visit | Attending: Internal Medicine | Admitting: Internal Medicine

## 2021-08-16 DIAGNOSIS — J849 Interstitial pulmonary disease, unspecified: Secondary | ICD-10-CM | POA: Diagnosis not present

## 2021-08-16 NOTE — Progress Notes (Signed)
Daily Session Note ? ?Patient Details  ?Name: Tasha [name] ?MRN: [id number] ?Date of Birth: [birth year] ?Referring Provider:   ?Flowsheet Row PULMONARY REHAB OTHER RESP ORIENTATION from 05/29/2021 in [place]  ?Referring Provider Dr. Chase Caller  ? ?  ? ? ?Encounter Date: 08/16/2021 ? ?Check In: ? Session Check In - 08/16/21 1330   ? ?  ? Check-In  ? Supervising physician immediately available to respond to emergencies [facility] MD immediately available   ? Physician(s) Dr Domenic Polite   ? Location [facility] Pulmonary Rehab   ? Staff Present Geanie Cooley, RN;Kahealani Yankovich Hassell Done, RN, BSN;Heather Otho Ket, BS, Exercise Physiologist   ? Virtual Visit No   ? Medication changes reported     No   ? Fall or balance concerns reported    No   ? Tobacco Cessation No Change   ? Warm-up and Cool-down Performed as group-led instruction   ? Resistance Training Performed Yes   ? VAD Patient? No   ? PAD/SET Patient? No   ?  ? Pain Assessment  ? Currently in Pain? No/denies   ? Multiple Pain Sites No   ? ?  ?  ? ?  ? ? ?Capillary Blood Glucose: ?No results found for this or any previous visit (from the past 24 hour(s)). ? ? ? ?Social History  ? ?Tobacco Use  ?Smoking Status Former  ?Smokeless Tobacco Never  ? ? ?Goals Met:  ?Independence with exercise equipment ?Exercise tolerated well ?No report of concerns or symptoms today ?Strength training completed today ? ?Goals Unmet:  ?Not Applicable ? ?Comments: Check out at 1430. ? ? ?Dr. Kathie Dike is Medical Director for [facility] Pulmonary Rehab. ?

## 2021-08-21 ENCOUNTER — Encounter (HOSPITAL_COMMUNITY)
Admission: RE | Admit: 2021-08-21 | Discharge: 2021-08-21 | Disposition: A | Discharge status: Home / Self Care | Payer: Medicare Other | Source: Ambulatory Visit | Attending: Internal Medicine | Admitting: Internal Medicine

## 2021-08-21 DIAGNOSIS — J849 Interstitial pulmonary disease, unspecified: Secondary | ICD-10-CM | POA: Insufficient documentation

## 2021-08-21 NOTE — Progress Notes (Signed)
Daily Session Note ? ?Patient Details  ?Name: Tasha [name] ?MRN: [id number] ?Date of Birth: 1951-02-23 ?Referring Provider:   ?Flowsheet Row PULMONARY REHAB OTHER RESP ORIENTATION from 05/29/2021 in [place]  ?Referring Provider Dr. Chase Caller  ? ?  ? ? ?Encounter Date: 08/21/2021 ? ?Check In: ? Session Check In - 08/21/21 1330   ? ?  ? Check-In  ? Supervising physician immediately available to respond to emergencies [facility] MD immediately available   ? Physician(s) Dr Harl Bowie   ? Location [facility]   ? Staff Present Madelyn Flavors, RN, BSN;Phyllis Billingsley, RN;Heather Otho Ket, BS, Exercise Physiologist   ? Virtual Visit No   ? Medication changes reported     No   ? Fall or balance concerns reported    No   ? Tobacco Cessation No Change   ? Warm-up and Cool-down Performed as group-led instruction   ? Resistance Training Performed Yes   ? VAD Patient? No   ? PAD/SET Patient? No   ?  ? Pain Assessment  ? Currently in Pain? No/denies   ? Multiple Pain Sites No   ? ?  ?  ? ?  ? ? ?Capillary Blood Glucose: ?No results found for this or any previous visit (from the past 24 hour(s)). ? ? ? ?Social History  ? ?Tobacco Use  ?Smoking Status Former  ?Smokeless Tobacco Never  ? ? ?Goals Met:  ?Independence with exercise equipment ?Exercise tolerated well ?No report of concerns or symptoms today ?Strength training completed today ? ?Goals Unmet:  ?Not Applicable ? ?Comments: Check out at 1430 ? ? ?Dr. Kathie Dike is Medical Director for [facility] Pulmonary Rehab. ?

## 2021-08-23 ENCOUNTER — Encounter (HOSPITAL_COMMUNITY)
Admission: RE | Admit: 2021-08-23 | Discharge: 2021-08-23 | Disposition: A | Discharge status: Home / Self Care | Payer: Medicare Other | Source: Ambulatory Visit | Attending: Internal Medicine | Admitting: Internal Medicine

## 2021-08-23 DIAGNOSIS — J849 Interstitial pulmonary disease, unspecified: Secondary | ICD-10-CM

## 2021-08-23 NOTE — Progress Notes (Signed)
Daily Session Note ? ?Patient Details  ?Name: Tasha [name] ?MRN: [id number] ?Date of Birth: 01-23-1951 ?Referring Provider:   ?Flowsheet Row PULMONARY REHAB OTHER RESP ORIENTATION from 05/29/2021 in [place]  ?Referring Provider Dr. Chase Caller  ? ?  ? ? ?Encounter Date: 08/23/2021 ? ?Check In: ? Session Check In - 08/23/21 1300   ? ?  ? Check-In  ? Supervising physician immediately available to respond to emergencies [facility] MD immediately available   ? Physician(s) Dr Harl Bowie   ? Location [facility]   ? Staff Present Madelyn Flavors, RN, BSN;Phyllis Billingsley, RN;Heather Otho Ket, BS, Exercise Physiologist   ? Virtual Visit No   ? Medication changes reported     No   ? Fall or balance concerns reported    No   ? Tobacco Cessation No Change   ? Warm-up and Cool-down Performed as group-led instruction   ? Resistance Training Performed Yes   ? VAD Patient? No   ? PAD/SET Patient? No   ?  ? Pain Assessment  ? Currently in Pain? No/denies   ? Multiple Pain Sites No   ? ?  ?  ? ?  ? ? ?Capillary Blood Glucose: ?No results found for this or any previous visit (from the past 24 hour(s)). ? ? ? ?Social History  ? ?Tobacco Use  ?Smoking Status Former  ?Smokeless Tobacco Never  ? ? ?Goals Met:  ?Independence with exercise equipment ?Exercise tolerated well ?No report of concerns or symptoms today ?Strength training completed today ? ?Goals Unmet:  ?Not Applicable ? ?Comments: Check out at 1430. ? ? ?Dr. Kathie Dike is Medical Director for [facility] Pulmonary Rehab. ?

## 2021-08-24 DIAGNOSIS — Z20822 Contact with and (suspected) exposure to covid-19: Secondary | ICD-10-CM | POA: Diagnosis not present

## 2021-08-28 ENCOUNTER — Encounter (HOSPITAL_COMMUNITY)
Admission: RE | Admit: 2021-08-28 | Discharge: 2021-08-28 | Disposition: A | Discharge status: Home / Self Care | Payer: Medicare Other | Source: Ambulatory Visit | Attending: Internal Medicine | Admitting: Internal Medicine

## 2021-08-28 VITALS — Wt 136.7 lb

## 2021-08-28 DIAGNOSIS — J849 Interstitial pulmonary disease, unspecified: Secondary | ICD-10-CM | POA: Diagnosis not present

## 2021-08-28 NOTE — Progress Notes (Signed)
Daily Session Note ? ?Patient Details  ?Name: Tasha [name] ?MRN: [id number] ?Date of Birth: 02-15-1951 ?Referring Provider:   ?Flowsheet Row PULMONARY REHAB OTHER RESP ORIENTATION from 05/29/2021 in [place]  ?Referring Provider Dr. Chase Caller  ? ?  ? ? ?Encounter Date: 08/28/2021 ? ?Check In: ? Session Check In - 08/28/21 1300   ? ?  ? Check-In  ? Supervising physician immediately available to respond to emergencies [facility] MD immediately available   ? Physician(s) Dr Domenic Polite   ? Location [facility]   ? Staff Present Madelyn Flavors, RN, BSN;Phyllis Billingsley, RN;Heather Otho Ket, BS, Exercise Physiologist;Dalton Fletcher, MS, ACSM-CEP, Exercise Physiologist   ? Virtual Visit No   ? Medication changes reported     No   ? Fall or balance concerns reported    No   ? Tobacco Cessation No Change   ? Warm-up and Cool-down Performed as group-led instruction   ? Resistance Training Performed Yes   ? VAD Patient? No   ? PAD/SET Patient? No   ?  ? Pain Assessment  ? Currently in Pain? No/denies   ? Multiple Pain Sites No   ? ?  ?  ? ?  ? ? ?Capillary Blood Glucose: ?No results found for this or any previous visit (from the past 24 hour(s)). ? ? ? ?Social History  ? ?Tobacco Use  ?Smoking Status Former  ?Smokeless Tobacco Never  ? ? ?Goals Met:  ?Independence with exercise equipment ?Exercise tolerated well ?No report of concerns or symptoms today ?Strength training completed today ? ?Goals Unmet:  ?Not Applicable ? ?Comments: check out at 1430. ? ? ?Dr. Kathie Dike is Medical Director for [facility] Pulmonary Rehab. ?

## 2021-08-30 ENCOUNTER — Encounter (HOSPITAL_COMMUNITY)
Admission: RE | Admit: 2021-08-30 | Discharge: 2021-08-30 | Disposition: A | Discharge status: Home / Self Care | Payer: Medicare Other | Source: Ambulatory Visit | Attending: Internal Medicine | Admitting: Internal Medicine

## 2021-08-30 DIAGNOSIS — J849 Interstitial pulmonary disease, unspecified: Secondary | ICD-10-CM | POA: Diagnosis not present

## 2021-08-30 NOTE — Progress Notes (Signed)
Daily Session Note ? ?Patient Details  ?Name: Tasha [name] ?MRN: [id number] ?Date of Birth: 09-Dec-1950 ?Referring Provider:   ?Flowsheet Row PULMONARY REHAB OTHER RESP ORIENTATION from 05/29/2021 in [place]  ?Referring Provider Dr. Chase Caller  ? ?  ? ? ?Encounter Date: 08/30/2021 ? ?Check In: ? Session Check In - 08/30/21 1325   ? ?  ? Check-In  ? Supervising physician immediately available to respond to emergencies [facility] MD immediately available   ? Physician(s) Dr Marlou Porch   ? Location [facility]   ? Staff Present Madelyn Flavors, RN, BSN;Phyllis Billingsley, RN;Heather Otho Ket, BS, Exercise Physiologist;Dalton Fletcher, MS, ACSM-CEP, Exercise Physiologist   ? Virtual Visit No   ? Medication changes reported     No   ? Fall or balance concerns reported    No   ? Tobacco Cessation No Change   ? Warm-up and Cool-down Performed as group-led instruction   ? Resistance Training Performed No   ? VAD Patient? No   ? PAD/SET Patient? No   ?  ? Pain Assessment  ? Currently in Pain? No/denies   ? Multiple Pain Sites No   ? ?  ?  ? ?  ? ? ?Capillary Blood Glucose: ?No results found for this or any previous visit (from the past 24 hour(s)). ? ? ? ?Social History  ? ?Tobacco Use  ?Smoking Status Former  ?Smokeless Tobacco Never  ? ? ?Goals Met:  ?Independence with exercise equipment ?Exercise tolerated well ?No report of concerns or symptoms today ?Strength training completed today ? ?Goals Unmet:  ?Not Applicable ? ?Comments: check out at 1430. ? ? ?Dr. Kathie Dike is Medical Director for [facility] Pulmonary Rehab. ?

## 2021-09-04 ENCOUNTER — Encounter (HOSPITAL_COMMUNITY)
Admission: RE | Admit: 2021-09-04 | Discharge: 2021-09-04 | Disposition: A | Discharge status: Home / Self Care | Payer: Medicare Other | Source: Ambulatory Visit | Attending: Internal Medicine | Admitting: Internal Medicine

## 2021-09-04 DIAGNOSIS — J849 Interstitial pulmonary disease, unspecified: Secondary | ICD-10-CM

## 2021-09-04 NOTE — Progress Notes (Signed)
Daily Session Note ? ?Patient Details  ?Name: Tasha [name] ?MRN: [id number] ?Date of Birth: 08-11-1950 ?Referring Provider:   ?Flowsheet Row PULMONARY REHAB OTHER RESP ORIENTATION from 05/29/2021 in [place]  ?Referring Provider Dr. Chase Caller  ? ?  ? ? ?Encounter Date: 09/04/2021 ? ?Check In: ? Session Check In - 09/04/21 1327   ? ?  ? Check-In  ? Supervising physician immediately available to respond to emergencies [facility] MD immediately available   ? Physician(s) Dr Harl Bowie   ? Location [facility]   ? Staff Present Madelyn Flavors, RN, BSN;Phyllis Billingsley, RN;Heather Otho Ket, BS, Exercise Physiologist;Dalton Fletcher, MS, ACSM-CEP, Exercise Physiologist   ? Virtual Visit No   ? Medication changes reported     No   ? Fall or balance concerns reported    No   ? Tobacco Cessation No Change   ? Warm-up and Cool-down Performed as group-led instruction   ? Resistance Training Performed No   ? VAD Patient? No   ? PAD/SET Patient? No   ?  ? Pain Assessment  ? Currently in Pain? No/denies   ? Multiple Pain Sites No   ? ?  ?  ? ?  ? ? ?Capillary Blood Glucose: ?No results found for this or any previous visit (from the past 24 hour(s)). ? ? ? ?Social History  ? ?Tobacco Use  ?Smoking Status Former  ?Smokeless Tobacco Never  ? ? ?Goals Met:  ?Independence with exercise equipment ?Exercise tolerated well ?No report of concerns or symptoms today ?Strength training completed today ? ?Goals Unmet:  ?Not Applicable ? ?Comments: checkout at 1430. ? ? ?Dr. Kathie Dike is Medical Director for [facility] Pulmonary Rehab. ?

## 2021-09-06 ENCOUNTER — Encounter (HOSPITAL_COMMUNITY)
Admission: RE | Admit: 2021-09-06 | Discharge: 2021-09-06 | Disposition: A | Discharge status: Home / Self Care | Payer: Medicare Other | Source: Ambulatory Visit | Attending: Internal Medicine | Admitting: Internal Medicine

## 2021-09-06 DIAGNOSIS — J849 Interstitial pulmonary disease, unspecified: Secondary | ICD-10-CM | POA: Diagnosis not present

## 2021-09-06 NOTE — Progress Notes (Signed)
Daily Session Note  Patient Details  Name: Tasha [name] MRN: [id number] Date of Birth: [birth year] Referring Provider:   Flowsheet Row PULMONARY REHAB OTHER RESP ORIENTATION from 05/29/2021 in [place]  Referring Provider Dr. Chase Caller       Encounter Date: 09/06/2021  Check In:  Session Check In - 09/06/21 1326       Check-In   Supervising physician immediately available to respond to emergencies [facility] MD immediately available    Physician(s) Dr Harrington Challenger    Location [facility]    Staff Present Lumir Demetriou Hassell Done, RN, BSN;Phyllis Billingsley, RN;Dalton Fletcher, MS, ACSM-CEP, Exercise Physiologist;Debra Wynetta Emery, RN, BSN    Virtual Visit No    Medication changes reported     No    Fall or balance concerns reported    No    Tobacco Cessation No Change    Warm-up and Cool-down Performed as group-led instruction    Resistance Training Performed Yes    VAD Patient? No    PAD/SET Patient? No      Pain Assessment   Currently in Pain? No/denies    Multiple Pain Sites No             Capillary Blood Glucose: No results found for this or any previous visit (from the past 24 hour(s)).    Social History   Tobacco Use  Smoking Status Former  Smokeless Tobacco Never    Goals Met:  Independence with exercise equipment Exercise tolerated well No report of concerns or symptoms today Strength training completed today  Goals Unmet:  Not Applicable  Comments: Checkout at 1430.   Dr. Kathie Dike is Medical Director for [facility] Pulmonary Rehab.

## 2021-09-11 ENCOUNTER — Encounter (HOSPITAL_COMMUNITY)
Admission: RE | Admit: 2021-09-11 | Discharge: 2021-09-11 | Disposition: A | Discharge status: Home / Self Care | Payer: Medicare Other | Source: Ambulatory Visit | Attending: Internal Medicine | Admitting: Internal Medicine

## 2021-09-11 VITALS — Wt 136.0 lb

## 2021-09-11 DIAGNOSIS — J849 Interstitial pulmonary disease, unspecified: Secondary | ICD-10-CM

## 2021-09-11 NOTE — Progress Notes (Signed)
Daily Session Note  Patient Details  Name: Tasha [name] MRN: [id number] Date of Birth: 1950-11-18 Referring Provider:   Flowsheet Row PULMONARY REHAB OTHER RESP ORIENTATION from 05/29/2021 in [place]  Referring Provider Dr. Chase Caller       Encounter Date: 09/11/2021  Check In:  Session Check In - 09/11/21 1330       Check-In   Supervising physician immediately available to respond to emergencies [facility] MD immediately available    Physician(s) Dr Domenic Polite    Location [facility]    Staff Present Barbra Miner Hassell Done, RN, BSN;Phyllis Billingsley, RN;Dalton Kris Mouton, MS, ACSM-CEP, Exercise Physiologist;Heather Zigmund Daniel, Exercise Physiologist    Virtual Visit No    Medication changes reported     No    Fall or balance concerns reported    No    Tobacco Cessation No Change    Warm-up and Cool-down Performed as group-led instruction    Resistance Training Performed Yes    VAD Patient? No    PAD/SET Patient? No      Pain Assessment   Currently in Pain? No/denies    Multiple Pain Sites No             Capillary Blood Glucose: No results found for this or any previous visit (from the past 24 hour(s)).    Social History   Tobacco Use  Smoking Status Former  Smokeless Tobacco Never    Goals Met:  Independence with exercise equipment Exercise tolerated well No report of concerns or symptoms today Strength training completed today  Goals Unmet:  Not Applicable  Comments: Checkout at 1430.   Dr. Kathie Dike is Medical Director for [facility] Pulmonary Rehab.

## 2021-09-12 NOTE — Progress Notes (Signed)
Pulmonary Individual Treatment Plan  Patient Details  Name: Tasha [name] MRN: [id number] Date of Birth: 12-05-1950 Referring Provider:   Flowsheet Row PULMONARY REHAB OTHER RESP ORIENTATION from 05/29/2021 in [place]  Referring Provider Dr. Chase Caller       Initial Encounter Date:  Flowsheet Row PULMONARY REHAB OTHER RESP ORIENTATION from 05/29/2021 in [place]  Date 05/29/21       Visit Diagnosis: Interstitial lung disease ([place])  Patient's Home Medications on Admission:   Current Outpatient Medications:    acetaminophen (TYLENOL) 500 MG tablet, Take 1 tablet (500 mg total) by mouth every 6 (six) hours as needed., Disp: 30 tablet, Rfl: 0   aspirin EC 81 MG tablet, Take 1 tablet (81 mg total) by mouth daily., Disp: 90 tablet, Rfl: 3   atorvastatin (LIPITOR) 20 MG tablet, Take 1 tablet (20 mg total) by mouth daily at 6 PM., Disp: 90 tablet, Rfl: 3   budesonide (PULMICORT) 180 MCG/ACT inhaler, Inhale 2 puffs into the lungs in the morning and at bedtime., Disp: 1 each, Rfl: 5   cetirizine (ZYRTEC) 10 MG tablet, Take 1 tablet (10 mg total) by mouth daily as needed for allergies., Disp: 90 tablet, Rfl: 3   hydrochlorothiazide (HYDRODIURIL) 25 MG tablet, Take 1 tablet (25 mg total) by mouth daily., Disp: 90 tablet, Rfl: 3   ibuprofen (ADVIL) 200 MG tablet, Take 400 mg by mouth every 6 (six) hours as needed for headache or moderate pain., Disp: , Rfl:    Ipratropium-Albuterol (COMBIVENT RESPIMAT) 20-100 MCG/ACT AERS respimat, Inhale 1 puff into the lungs every 6 (six) hours as needed for wheezing., Disp: 4 g, Rfl: 5   levothyroxine (SYNTHROID) 50 MCG tablet, Take 1 tablet (50 mcg total) by mouth daily before breakfast., Disp: 90 tablet, Rfl: 3   losartan (COZAAR) 50 MG tablet, Take 1 tablet (50 mg total) by mouth daily., Disp: 90 tablet, Rfl: 3   Omega 3 1000 MG CAPS, Take 1,000 mg by mouth daily. , Disp: , Rfl:    ondansetron (ZOFRAN-ODT) 8  MG disintegrating tablet, Take 1 tablet (8 mg total) by mouth every 6 (six) hours as needed for nausea or vomiting., Disp: 20 tablet, Rfl: 1   pantoprazole (PROTONIX) 40 MG tablet, Take 1 tablet (40 mg total) by mouth daily., Disp: 90 tablet, Rfl: 3   potassium chloride (KLOR-CON) 20 MEQ tablet, Take 1 tablet (20 mEq total) by mouth daily., Disp: 90 tablet, Rfl: 3  Past Medical History: Past Medical History:  Diagnosis Date   Acute respiratory failure ([place])    CAP (community acquired pneumonia)    Hyperlipidemia    Hypertension    Metabolic syndrome     Tobacco Use: Social History   Tobacco Use  Smoking Status Former  Smokeless Tobacco Never    Labs: Review Flowsheet        Latest Ref Rng & Units 07/14/2019 10/18/2019 03/23/2020 03/23/2021  Labs for ITP Cardiac and Pulmonary Rehab  Cholestrol 100 - 199 mg/dL  117   135   125    LDL (calc) 0 - 99 mg/dL  51   71   66    HDL-C >39 mg/dL  36   45   41    Trlycerides 0 - 149 mg/dL  177   105   94    Hemoglobin A1c 4.8 - 5.6 % 5.3   5.6   5.3   5.1       06/21/2021  Labs for  ITP Cardiac and Pulmonary Rehab  Cholestrol   LDL (calc)   HDL-C   Trlycerides   Hemoglobin A1c 5.3            Capillary Blood Glucose: Lab Results  Component Value Date   GLUCAP 165 (H) 01/09/2018   GLUCAP 142 (H) 01/08/2018   GLUCAP 139 (H) 01/08/2018   GLUCAP 117 (H) 01/08/2018   GLUCAP 85 01/08/2018     Pulmonary Assessment Scores:  Pulmonary Assessment Scores     Row Name 05/29/21 1305         ADL UCSD   ADL Phase Entry     SOB Score total 13     Rest 0     Walk 1     Stairs 4     Bath 0     Dress 0     Shop 0       CAT Score   CAT Score 12       mMRC Score   mMRC Score 2             UCSD: Self-administered rating of dyspnea associated with activities of daily living (ADLs) 6-point scale (0 = "not at all" to 5 = "maximal or unable to do because of breathlessness")  Scoring Scores range from 0 to 120.  Minimally  important difference is 5 units  CAT: CAT can identify the health impairment of COPD patients and is better correlated with disease progression.  CAT has a scoring range of zero to 40. The CAT score is classified into four groups of low (less than 10), medium (10 - 20), high (21-30) and very high (31-40) based on the impact level of disease on health status. A CAT score over 10 suggests significant symptoms.  A worsening CAT score could be explained by an exacerbation, poor medication adherence, poor inhaler technique, or progression of COPD or comorbid conditions.  CAT MCID is 2 points  mMRC: mMRC (Modified Medical Research Council) Dyspnea Scale is used to assess the degree of baseline functional disability in patients of respiratory disease due to dyspnea. No minimal important difference is established. A decrease in score of 1 point or greater is considered a positive change.   Pulmonary Function Assessment:   Exercise Target Goals: Exercise Program Goal: Individual exercise prescription set using results from initial 6 min walk test and THRR while considering  patient's activity barriers and safety.   Exercise Prescription Goal: Initial exercise prescription builds to 30-45 minutes a day of aerobic activity, 2-3 days per week.  Home exercise guidelines will be given to patient during program as part of exercise prescription that the participant will acknowledge.  Activity Barriers & Risk Stratification:  Activity Barriers & Cardiac Risk Stratification - 05/29/21 1304       Activity Barriers & Cardiac Risk Stratification   Activity Barriers Shortness of Breath    Cardiac Risk Stratification Moderate             6 Minute Walk:  6 Minute Walk     Row Name 05/29/21 1456         6 Minute Walk   Phase Initial     Distance 1150 feet     Walk Time 6 minutes     # of Rest Breaks 0     MPH 2.18     METS 2.74     RPE 10     Perceived Dyspnea  10     VO2 Peak 9.58  Symptoms No     Resting HR 88 bpm     Resting BP 138/82     Resting Oxygen Saturation  95 %     Exercise Oxygen Saturation  during 6 min walk 93 %     Max Ex. HR 111 bpm     Max Ex. BP 140/84     2 Minute Post BP 130/78       Interval HR   1 Minute HR 105     2 Minute HR 110     3 Minute HR 111     4 Minute HR 108     5 Minute HR 111     6 Minute HR 110     2 Minute Post HR 92     Interval Heart Rate? Yes       Interval Oxygen   Interval Oxygen? Yes     Baseline Oxygen Saturation % 95 %     1 Minute Oxygen Saturation % 93 %     1 Minute Liters of Oxygen 0 L     2 Minute Oxygen Saturation % 94 %     2 Minute Liters of Oxygen 0 L     3 Minute Oxygen Saturation % 93 %     3 Minute Liters of Oxygen 0 L     4 Minute Oxygen Saturation % 93 %     4 Minute Liters of Oxygen 0 L     5 Minute Oxygen Saturation % 93 %     5 Minute Liters of Oxygen 0 L     6 Minute Oxygen Saturation % 93 %     6 Minute Liters of Oxygen 0 L     2 Minute Post Oxygen Saturation % 96 %     2 Minute Post Liters of Oxygen 0 L              Oxygen Initial Assessment:  Oxygen Initial Assessment - 05/29/21 1502       Initial 6 min Walk   Oxygen Used None      Program Oxygen Prescription   Program Oxygen Prescription None      Intervention   Short Term Goals To learn and understand importance of monitoring SPO2 with pulse oximeter and demonstrate accurate use of the pulse oximeter.;To learn and demonstrate proper pursed lip breathing techniques or other breathing techniques. ;To learn and understand importance of maintaining oxygen saturations>88%    Long  Term Goals Compliance with respiratory medication;Exhibits proper breathing techniques, such as pursed lip breathing or other method taught during program session;Maintenance of O2 saturations>88%;Verbalizes importance of monitoring SPO2 with pulse oximeter and return demonstration             Oxygen Re-Evaluation:  Oxygen Re-Evaluation      Row Name 06/19/21 1453 07/17/21 1448 08/14/21 1554 09/11/21 1539       Program Oxygen Prescription   Program Oxygen Prescription None None None None      Home Oxygen   Home Oxygen Device None None None None    Sleep Oxygen Prescription None None None None    Home Exercise Oxygen Prescription None None None None    Home Resting Oxygen Prescription None None None None    Compliance with Home Oxygen Use Yes Yes Yes Yes      Goals/Expected Outcomes   Short Term Goals To learn and understand importance of monitoring SPO2 with pulse oximeter and demonstrate accurate use of the pulse oximeter.;To  learn and demonstrate proper pursed lip breathing techniques or other breathing techniques. ;To learn and understand importance of maintaining oxygen saturations>88% To learn and understand importance of monitoring SPO2 with pulse oximeter and demonstrate accurate use of the pulse oximeter.;To learn and demonstrate proper pursed lip breathing techniques or other breathing techniques. ;To learn and understand importance of maintaining oxygen saturations>88% To learn and understand importance of monitoring SPO2 with pulse oximeter and demonstrate accurate use of the pulse oximeter.;To learn and demonstrate proper pursed lip breathing techniques or other breathing techniques. ;To learn and understand importance of maintaining oxygen saturations>88% To learn and understand importance of monitoring SPO2 with pulse oximeter and demonstrate accurate use of the pulse oximeter.;To learn and demonstrate proper pursed lip breathing techniques or other breathing techniques. ;To learn and understand importance of maintaining oxygen saturations>88%    Long  Term Goals Compliance with respiratory medication;Exhibits proper breathing techniques, such as pursed lip breathing or other method taught during program session;Maintenance of O2 saturations>88%;Verbalizes importance of monitoring SPO2 with pulse oximeter and return  demonstration Compliance with respiratory medication;Exhibits proper breathing techniques, such as pursed lip breathing or other method taught during program session;Maintenance of O2 saturations>88%;Verbalizes importance of monitoring SPO2 with pulse oximeter and return demonstration Compliance with respiratory medication;Exhibits proper breathing techniques, such as pursed lip breathing or other method taught during program session;Maintenance of O2 saturations>88%;Verbalizes importance of monitoring SPO2 with pulse oximeter and return demonstration Compliance with respiratory medication;Exhibits proper breathing techniques, such as pursed lip breathing or other method taught during program session;Maintenance of O2 saturations>88%;Verbalizes importance of monitoring SPO2 with pulse oximeter and return demonstration    Goals/Expected Outcomes compliance compliance compliance compliance             Oxygen Discharge (Final Oxygen Re-Evaluation):  Oxygen Re-Evaluation - 09/11/21 1539       Program Oxygen Prescription   Program Oxygen Prescription None      Home Oxygen   Home Oxygen Device None    Sleep Oxygen Prescription None    Home Exercise Oxygen Prescription None    Home Resting Oxygen Prescription None    Compliance with Home Oxygen Use Yes      Goals/Expected Outcomes   Short Term Goals To learn and understand importance of monitoring SPO2 with pulse oximeter and demonstrate accurate use of the pulse oximeter.;To learn and demonstrate proper pursed lip breathing techniques or other breathing techniques. ;To learn and understand importance of maintaining oxygen saturations>88%    Long  Term Goals Compliance with respiratory medication;Exhibits proper breathing techniques, such as pursed lip breathing or other method taught during program session;Maintenance of O2 saturations>88%;Verbalizes importance of monitoring SPO2 with pulse oximeter and return demonstration    Goals/Expected  Outcomes compliance             Initial Exercise Prescription:  Initial Exercise Prescription - 05/29/21 1400       Date of Initial Exercise RX and Referring Provider   Date 05/29/21    Referring Provider Dr. Chase Caller    Expected Discharge Date 10/02/21      Treadmill   MPH 2    Grade 0    Minutes 17      NuStep   Level 1    SPM 80    Minutes 22      Prescription Details   Frequency (times per week) 2    Duration Progress to 30 minutes of continuous aerobic without signs/symptoms of physical distress      Intensity   THRR  40-80% of Max Heartrate 60-120    Ratings of Perceived Exertion 11-13    Perceived Dyspnea 0-4      Resistance Training   Training Prescription Yes    Weight 3 lbs    Reps 10-15             Perform Capillary Blood Glucose checks as needed.  Exercise Prescription Changes:   Exercise Prescription Changes     Row Name 06/05/21 1400 06/19/21 1400 07/03/21 1330 07/17/21 1400 07/31/21 1400     Response to Exercise   Blood Pressure (Admit) 152/78 112/60 112/66 110/66 122/64   Blood Pressure (Exercise) 142/80 138/70 134/76 132/66 142/62   Blood Pressure (Exit) 134/62 110/60 120/60 108/64 118/60   Heart Rate (Admit) 90 bpm 85 bpm 84 bpm 79 bpm 80 bpm   Heart Rate (Exercise) 111 bpm 116 bpm 117 bpm 114 bpm 114 bpm   Heart Rate (Exit) 96 bpm 95 bpm 100 bpm 95 bpm 96 bpm   Oxygen Saturation (Admit) 94 % 95 % 98 % 96 % 98 %   Oxygen Saturation (Exercise) 94 % 92 % 92 % 93 % 93 %   Oxygen Saturation (Exit) 95 % 96 % 95 % 96 % 94 %   Rating of Perceived Exertion (Exercise) '10 12 12 12 13   '$ Perceived Dyspnea (Exercise) '10 13 13 12 13   '$ Duration Continue with 30 min of aerobic exercise without signs/symptoms of physical distress. Continue with 30 min of aerobic exercise without signs/symptoms of physical distress. Continue with 30 min of aerobic exercise without signs/symptoms of physical distress. Continue with 30 min of aerobic exercise without  signs/symptoms of physical distress. Continue with 30 min of aerobic exercise without signs/symptoms of physical distress.   Intensity THRR unchanged THRR unchanged THRR unchanged THRR unchanged THRR unchanged     Progression   Progression Continue to progress workloads to maintain intensity without signs/symptoms of physical distress. Continue to progress workloads to maintain intensity without signs/symptoms of physical distress. Continue to progress workloads to maintain intensity without signs/symptoms of physical distress. Continue to progress workloads to maintain intensity without signs/symptoms of physical distress. Continue to progress workloads to maintain intensity without signs/symptoms of physical distress.     Resistance Training   Training Prescription Yes Yes Yes Yes Yes   Weight '3 3 3 3 3   '$ Reps 10-15 10-15 10-15 10-15 10-15   Time 10 Minutes 10 Minutes 10 Minutes 10 Minutes 10 Minutes     Treadmill   MPH 2.5 2.9 2.'9 3 3   '$ Grade 0 0 0 0 0   Minutes '17 17 17 17 17   '$ METs 2.91 3.22 3.22 3.3 3.3     NuStep   Level '1 2 3 3 3   '$ SPM 83 107 96 110 107   Minutes '22 22 22 22 22   '$ METs 2 2.4 2.4 2.6 2.4    Row Name 08/09/21 1400 08/14/21 1500 08/28/21 1400 09/11/21 1500       Response to Exercise   Blood Pressure (Admit) -- 106/60 122/60 122/64    Blood Pressure (Exercise) -- 132/68 138/60 140/66    Blood Pressure (Exit) -- 120/60 128/60 124/60    Heart Rate (Admit) -- 71 bpm 81 bpm 76 bpm    Heart Rate (Exercise) -- 113 bpm 116 bpm 106 bpm    Heart Rate (Exit) -- 84 bpm 87 bpm 85 bpm    Oxygen Saturation (Admit) -- 98 % 97 % 97 %  Oxygen Saturation (Exercise) -- 96 % 94 % 93 %    Oxygen Saturation (Exit) -- 96 % 97 % 96 %    Rating of Perceived Exertion (Exercise) -- '14 13 12    '$ Perceived Dyspnea (Exercise) -- '13 13 12    '$ Duration -- Continue with 30 min of aerobic exercise without signs/symptoms of physical distress. Continue with 30 min of aerobic exercise without  signs/symptoms of physical distress. Continue with 30 min of aerobic exercise without signs/symptoms of physical distress.    Intensity -- THRR unchanged THRR unchanged THRR unchanged      Progression   Progression -- Continue to progress workloads to maintain intensity without signs/symptoms of physical distress. Continue to progress workloads to maintain intensity without signs/symptoms of physical distress. Continue to progress workloads to maintain intensity without signs/symptoms of physical distress.      Resistance Training   Training Prescription -- Yes Yes Yes    Weight -- '3 3 3    '$ Reps -- 10-15 10-15 10-15    Time -- 10 Minutes 10 Minutes 10 Minutes      Treadmill   MPH -- '3 3 3    '$ Grade -- 0 0 2    Minutes -- '17 17 17    '$ METs -- 3.3 3.3 4.12      NuStep   Level -- '3 3 3    '$ SPM -- 110 103 106    Minutes -- '22 22 22    '$ METs -- 2.3 2.5 2.5      Home Exercise Plan   Plans to continue exercise at Home (comment) -- -- --    Frequency Add 3 additional days to program exercise sessions. -- -- --    Initial Home Exercises Provided 08/09/21 -- -- --             Exercise Comments:   Exercise Comments     Row Name 08/09/21 1411           Exercise Comments home exrecise reviewd                Exercise Goals and Review:   Exercise Goals     Row Name 05/29/21 1501 06/19/21 1454 07/17/21 1447 08/14/21 1550 09/11/21 1535     Exercise Goals   Increase Physical Activity Yes Yes Yes Yes Yes   Intervention Provide advice, education, support and counseling about physical activity/exercise needs.;Develop an individualized exercise prescription for aerobic and resistive training based on initial evaluation findings, risk stratification, comorbidities and participant's personal goals. Provide advice, education, support and counseling about physical activity/exercise needs.;Develop an individualized exercise prescription for aerobic and resistive training based on initial  evaluation findings, risk stratification, comorbidities and participant's personal goals. Provide advice, education, support and counseling about physical activity/exercise needs.;Develop an individualized exercise prescription for aerobic and resistive training based on initial evaluation findings, risk stratification, comorbidities and participant's personal goals. Provide advice, education, support and counseling about physical activity/exercise needs.;Develop an individualized exercise prescription for aerobic and resistive training based on initial evaluation findings, risk stratification, comorbidities and participant's personal goals. Provide advice, education, support and counseling about physical activity/exercise needs.;Develop an individualized exercise prescription for aerobic and resistive training based on initial evaluation findings, risk stratification, comorbidities and participant's personal goals.   Expected Outcomes Short Term: Attend rehab on a regular basis to increase amount of physical activity.;Long Term: Exercising regularly at least 3-5 days a week.;Long Term: Add in home exercise to make exercise part of routine and  to increase amount of physical activity. Short Term: Attend rehab on a regular basis to increase amount of physical activity.;Long Term: Exercising regularly at least 3-5 days a week.;Long Term: Add in home exercise to make exercise part of routine and to increase amount of physical activity. Short Term: Attend rehab on a regular basis to increase amount of physical activity.;Long Term: Exercising regularly at least 3-5 days a week.;Long Term: Add in home exercise to make exercise part of routine and to increase amount of physical activity. Short Term: Attend rehab on a regular basis to increase amount of physical activity.;Long Term: Exercising regularly at least 3-5 days a week.;Long Term: Add in home exercise to make exercise part of routine and to increase amount of  physical activity. Short Term: Attend rehab on a regular basis to increase amount of physical activity.;Long Term: Exercising regularly at least 3-5 days a week.;Long Term: Add in home exercise to make exercise part of routine and to increase amount of physical activity.   Increase Strength and Stamina Yes Yes Yes Yes Yes   Intervention Provide advice, education, support and counseling about physical activity/exercise needs.;Develop an individualized exercise prescription for aerobic and resistive training based on initial evaluation findings, risk stratification, comorbidities and participant's personal goals. Provide advice, education, support and counseling about physical activity/exercise needs.;Develop an individualized exercise prescription for aerobic and resistive training based on initial evaluation findings, risk stratification, comorbidities and participant's personal goals. Provide advice, education, support and counseling about physical activity/exercise needs.;Develop an individualized exercise prescription for aerobic and resistive training based on initial evaluation findings, risk stratification, comorbidities and participant's personal goals. Provide advice, education, support and counseling about physical activity/exercise needs.;Develop an individualized exercise prescription for aerobic and resistive training based on initial evaluation findings, risk stratification, comorbidities and participant's personal goals. Provide advice, education, support and counseling about physical activity/exercise needs.;Develop an individualized exercise prescription for aerobic and resistive training based on initial evaluation findings, risk stratification, comorbidities and participant's personal goals.   Expected Outcomes Short Term: Perform resistance training exercises routinely during rehab and add in resistance training at home;Short Term: Increase workloads from initial exercise prescription for  resistance, speed, and METs.;Long Term: Improve cardiorespiratory fitness, muscular endurance and strength as measured by increased METs and functional capacity (6MWT) Short Term: Perform resistance training exercises routinely during rehab and add in resistance training at home;Short Term: Increase workloads from initial exercise prescription for resistance, speed, and METs.;Long Term: Improve cardiorespiratory fitness, muscular endurance and strength as measured by increased METs and functional capacity (6MWT) Short Term: Perform resistance training exercises routinely during rehab and add in resistance training at home;Short Term: Increase workloads from initial exercise prescription for resistance, speed, and METs.;Long Term: Improve cardiorespiratory fitness, muscular endurance and strength as measured by increased METs and functional capacity (6MWT) Short Term: Perform resistance training exercises routinely during rehab and add in resistance training at home;Short Term: Increase workloads from initial exercise prescription for resistance, speed, and METs.;Long Term: Improve cardiorespiratory fitness, muscular endurance and strength as measured by increased METs and functional capacity (6MWT) Short Term: Perform resistance training exercises routinely during rehab and add in resistance training at home;Short Term: Increase workloads from initial exercise prescription for resistance, speed, and METs.;Long Term: Improve cardiorespiratory fitness, muscular endurance and strength as measured by increased METs and functional capacity (6MWT)   Able to understand and use rate of perceived exertion (RPE) scale Yes Yes Yes Yes Yes   Intervention Provide education and explanation on how to use RPE  scale Provide education and explanation on how to use RPE scale Provide education and explanation on how to use RPE scale Provide education and explanation on how to use RPE scale Provide education and explanation on how to  use RPE scale   Expected Outcomes Long Term:  Able to use RPE to guide intensity level when exercising independently;Short Term: Able to use RPE daily in rehab to express subjective intensity level Long Term:  Able to use RPE to guide intensity level when exercising independently;Short Term: Able to use RPE daily in rehab to express subjective intensity level Long Term:  Able to use RPE to guide intensity level when exercising independently;Short Term: Able to use RPE daily in rehab to express subjective intensity level Long Term:  Able to use RPE to guide intensity level when exercising independently;Short Term: Able to use RPE daily in rehab to express subjective intensity level Long Term:  Able to use RPE to guide intensity level when exercising independently;Short Term: Able to use RPE daily in rehab to express subjective intensity level   Able to understand and use Dyspnea scale Yes Yes Yes Yes Yes   Intervention Provide education and explanation on how to use Dyspnea scale Provide education and explanation on how to use Dyspnea scale Provide education and explanation on how to use Dyspnea scale Provide education and explanation on how to use Dyspnea scale Provide education and explanation on how to use Dyspnea scale   Expected Outcomes Short Term: Able to use Dyspnea scale daily in rehab to express subjective sense of shortness of breath during exertion;Long Term: Able to use Dyspnea scale to guide intensity level when exercising independently Short Term: Able to use Dyspnea scale daily in rehab to express subjective sense of shortness of breath during exertion;Long Term: Able to use Dyspnea scale to guide intensity level when exercising independently Short Term: Able to use Dyspnea scale daily in rehab to express subjective sense of shortness of breath during exertion;Long Term: Able to use Dyspnea scale to guide intensity level when exercising independently Short Term: Able to use Dyspnea scale daily in  rehab to express subjective sense of shortness of breath during exertion;Long Term: Able to use Dyspnea scale to guide intensity level when exercising independently Short Term: Able to use Dyspnea scale daily in rehab to express subjective sense of shortness of breath during exertion;Long Term: Able to use Dyspnea scale to guide intensity level when exercising independently   Knowledge and understanding of Target Heart Rate Range (THRR) Yes Yes Yes Yes Yes   Intervention Provide education and explanation of THRR including how the numbers were predicted and where they are located for reference Provide education and explanation of THRR including how the numbers were predicted and where they are located for reference Provide education and explanation of THRR including how the numbers were predicted and where they are located for reference Provide education and explanation of THRR including how the numbers were predicted and where they are located for reference Provide education and explanation of THRR including how the numbers were predicted and where they are located for reference   Expected Outcomes Short Term: Able to state/look up THRR;Long Term: Able to use THRR to govern intensity when exercising independently;Short Term: Able to use daily as guideline for intensity in rehab Short Term: Able to state/look up THRR;Long Term: Able to use THRR to govern intensity when exercising independently;Short Term: Able to use daily as guideline for intensity in rehab Short Term: Able to state/look up  THRR;Long Term: Able to use THRR to govern intensity when exercising independently;Short Term: Able to use daily as guideline for intensity in rehab Short Term: Able to state/look up THRR;Long Term: Able to use THRR to govern intensity when exercising independently;Short Term: Able to use daily as guideline for intensity in rehab Short Term: Able to state/look up THRR;Long Term: Able to use THRR to govern intensity when  exercising independently;Short Term: Able to use daily as guideline for intensity in rehab   Understanding of Exercise Prescription Yes Yes Yes Yes Yes   Intervention Provide education, explanation, and written materials on patient's individual exercise prescription Provide education, explanation, and written materials on patient's individual exercise prescription Provide education, explanation, and written materials on patient's individual exercise prescription Provide education, explanation, and written materials on patient's individual exercise prescription Provide education, explanation, and written materials on patient's individual exercise prescription   Expected Outcomes Short Term: Able to explain program exercise prescription;Long Term: Able to explain home exercise prescription to exercise independently Short Term: Able to explain program exercise prescription;Long Term: Able to explain home exercise prescription to exercise independently Short Term: Able to explain program exercise prescription;Long Term: Able to explain home exercise prescription to exercise independently Short Term: Able to explain program exercise prescription;Long Term: Able to explain home exercise prescription to exercise independently Short Term: Able to explain program exercise prescription;Long Term: Able to explain home exercise prescription to exercise independently            Exercise Goals Re-Evaluation :  Exercise Goals Re-Evaluation     [place] Name 06/19/21 1455 07/17/21 1447 08/14/21 1550 09/11/21 1535       Exercise Goal Re-Evaluation   Exercise Goals Review Increase Physical Activity;Increase Strength and Stamina;Able to understand and use rate of perceived exertion (RPE) scale;Able to understand and use Dyspnea scale;Knowledge and understanding of Target Heart Rate Range (THRR);Understanding of Exercise Prescription Increase Physical Activity;Increase Strength and Stamina;Able to understand and use rate  of perceived exertion (RPE) scale;Able to understand and use Dyspnea scale;Knowledge and understanding of Target Heart Rate Range (THRR);Understanding of Exercise Prescription Increase Physical Activity;Increase Strength and Stamina;Able to understand and use rate of perceived exertion (RPE) scale;Able to understand and use Dyspnea scale;Knowledge and understanding of Target Heart Rate Range (THRR);Understanding of Exercise Prescription Increase Physical Activity;Increase Strength and Stamina;Able to understand and use rate of perceived exertion (RPE) scale;Able to understand and use Dyspnea scale;Knowledge and understanding of Target Heart Rate Range (THRR);Understanding of Exercise Prescription    Comments Pt has attended 6 exercise sessions. She reports going to the gym already before starting the program, but her daughter reports that she does not push herself hard enough. She has been able to progress her workloads and is eager to improve her functioning. She is currently exercising at 3.22 METs. Will continue to monitor and progress as able. Pt has attended 13 exercise sessions. She has been able to progress her workloads while in the program and is motivated to improve her physical fitness. She continues to go to the gym on her days away from rehab. Her HR has decreased while on the TM, so we have been able to increase her intensities on it. She is currently exercising at 3.3 METs. Will continue to monitor and progress as able. Pt has attended 21 sessions of pulmonary rehab. She continues to progress her workload and is motivated to improve. She has a home exercise equipment and also goes to the gym on her off days from class. She is  currently exercising at 3.30 METs on the treadmill. Will continue to monitor and progress as able. Pt has attended 29 sessions of pulmonary rehab. She is pushing herself during class and ibcreasing her workload when able. She is exercising at home using treadmill and also goes  to the gym. She is currently exercising at 4.15 METs. Will continue to monitor and progress as able.    Expected Outcomes Through exercise at rehab and at home, the patient will meet their stated goals. Through exercise at rehab and at home, the patient will meet their stated goals. Through exercise at rehab and at home, the patient will meet their stated goals. Through exercise at rehab and at home, the patient will meet their stated goals.             Discharge Exercise Prescription (Final Exercise Prescription Changes):  Exercise Prescription Changes - 09/11/21 1500       Response to Exercise   Blood Pressure (Admit) 122/64    Blood Pressure (Exercise) 140/66    Blood Pressure (Exit) 124/60    Heart Rate (Admit) 76 bpm    Heart Rate (Exercise) 106 bpm    Heart Rate (Exit) 85 bpm    Oxygen Saturation (Admit) 97 %    Oxygen Saturation (Exercise) 93 %    Oxygen Saturation (Exit) 96 %    Rating of Perceived Exertion (Exercise) 12    Perceived Dyspnea (Exercise) 12    Duration Continue with 30 min of aerobic exercise without signs/symptoms of physical distress.    Intensity THRR unchanged      Progression   Progression Continue to progress workloads to maintain intensity without signs/symptoms of physical distress.      Resistance Training   Training Prescription Yes    Weight 3    Reps 10-15    Time 10 Minutes      Treadmill   MPH 3    Grade 2    Minutes 17    METs 4.12      NuStep   Level 3    SPM 106    Minutes 22    METs 2.5             Nutrition:  Target Goals: Understanding of nutrition guidelines, daily intake of sodium '1500mg'$ , cholesterol '200mg'$ , calories 30% from fat and 7% or less from saturated fats, daily to have 5 or more servings of fruits and vegetables.  Biometrics:  Pre Biometrics - 05/29/21 1501       Pre Biometrics   Height '5\' 1"'$  (1.549 m)    Weight 64.2 kg    Waist Circumference 35 inches    Hip Circumference 39.5 inches    Waist  to Hip Ratio 0.89 %    BMI (Calculated) 26.76    Triceps Skinfold 35 mm    % Body Fat 40.4 %    Grip Strength 22.7 kg    Flexibility 0 in    Single Leg Stand 25.15 seconds              Nutrition Therapy Plan and Nutrition Goals:  Nutrition Therapy & Goals - 08/06/21 1142       Personal Nutrition Goals   Comments Patient averages weight around 62.2kg, staying consistent .   We offer 2 educational sessions on heart healthy nutrition with handouts and offer assistance with RD referral if patinet is interested.  Will evaluate the need for spanish information .      Intervention Plan   Intervention Nutrition  handout(s) given to patient.    Expected Outcomes Short Term Goal: Understand basic principles of dietary content, such as calories, fat, sodium, cholesterol and nutrients.             Nutrition Assessments:  Nutrition Assessments - 05/29/21 1321       MEDFICTS Scores   Pre Score 54            MEDIFICTS Score Key: ?70 Need to make dietary changes  40-70 Heart Healthy Diet ? 40 Therapeutic Level Cholesterol Diet   Picture Your Plate Scores: <75 Unhealthy dietary pattern with much room for improvement. 41-50 Dietary pattern unlikely to meet recommendations for good health and room for improvement. 51-60 More healthful dietary pattern, with some room for improvement.  >60 Healthy dietary pattern, although there may be some specific behaviors that could be improved.    Nutrition Goals Re-Evaluation:   Nutrition Goals Discharge (Final Nutrition Goals Re-Evaluation):   Psychosocial: Target Goals: Acknowledge presence or absence of significant depression and/or stress, maximize coping skills, provide positive support system. Participant is able to verbalize types and ability to use techniques and skills needed for reducing stress and depression.  Initial Review & Psychosocial Screening:  Initial Psych Review & Screening - 05/29/21 1312       Initial  Review   Current issues with None Identified      Family Dynamics   Good Support System? Yes    Comments Her family is her support system.      Barriers   Psychosocial barriers to participate in program There are no identifiable barriers or psychosocial needs.      Screening Interventions   Interventions Encouraged to exercise    Expected Outcomes Short Term goal: Identification and review with participant of any Quality of Life or Depression concerns found by scoring the questionnaire.             Quality of Life Scores:  Scores of 19 and below usually indicate a poorer quality of life in these areas.  A difference of  2-3 points is a clinically meaningful difference.  A difference of 2-3 points in the total score of the Quality of Life Index has been associated with significant improvement in overall quality of life, self-image, physical symptoms, and general health in studies assessing change in quality of life.   PHQ-9: Review Flowsheet        06/21/2021 05/29/2021 03/23/2021 09/22/2020 09/21/2020  Depression screen PHQ 2/9  Decreased Interest 0 0 0 0 0  Down, Depressed, Hopeless 0 0 0 0 0  PHQ - 2 Score 0 0 0 0 0  Altered sleeping 0 2     Tired, decreased energy 0 0     Change in appetite 0 0     Feeling bad or failure about yourself  0 0     Trouble concentrating 0 0     Moving slowly or fidgety/restless 0 2     Suicidal thoughts 0 0     PHQ-9 Score  4     Difficult doing work/chores  Not difficult at all            Interpretation of Total Score  Total Score Depression Severity:  1-4 = Minimal depression, 5-9 = Mild depression, 10-14 = Moderate depression, 15-19 = Moderately severe depression, 20-27 = Severe depression   Psychosocial Evaluation and Intervention:  Psychosocial Evaluation - 05/29/21 1503       Psychosocial Evaluation & Interventions   Interventions Stress management  education;Relaxation education;Encouraged to exercise with the program and follow  exercise prescription    Comments Pt's only barrier to rehab is language. She speaks very little [place]. Her daughter, Brayton Layman, was her translator today. A translator will be here during all of her visits. She has no identifiable psychosocial issues. She scored a 4 on her PHQ-9, but reports that she copes well. Her support system is her family which includes her husband, her children, and her grand children. Her goals while in the program are to decrease her SOB with exertion. She previously went to the gym months ago, but has not been back in awhile. She is eager to start the program.    Expected Outcomes Pt will continue to have no identifiable psychosocial issue.    Continue Psychosocial Services  No Follow up required             Psychosocial Re-Evaluation:  Psychosocial Re-Evaluation     [place] Name 06/12/21 1003 07/04/21 1458 08/06/21 1140 09/03/21 0957       Psychosocial Re-Evaluation   Current issues with None Identified None Identified None Identified None Identified    Comments Patient is new to the program completing 3 sessions.  Her initial QOL score was 12 and her PHQ-9 was 4.  She seems to enjly coming to the program.  She don't speak english (very little).  Interpreter is here during class .  We will continue to monitor and also she will continue to have no psychosocial issuses identified. Patient has completed 9 sessions and continues to have no psychosocial issues identified.   She seems to enjoy coming to the program.  She don't speak english (very little).  Interpreter is here during class .  We will continue to monitor and also she will continue to have no psychosocial issuses identified. Patient has completed 18 sessions and continues to have no psychosocial issues identified.   She seems to enjoy coming to the program. Patient very motivated and works hard during exercise.  She don't speak english (very little).  Interpreter is here during class .  We will continue to monitor  and also she will continue to have no psychosocial issuses identified. Patient has completed 27 sessions and continues to have no psychosocial issues identified.   She seems to enjoy coming to the program. Patient very motivated and works hard during exercise.  She don't speak english (very little).  Interpreter is here during class .  We will continue to monitor and also she will continue to have no psychosocial issuses identified.    Expected Outcomes Patient will have no psychosocial issuses identified. Patient will have no psychosocial issuses identified. Patient will have no psychosocial issuses identified. Patient will have no psychosocial issuses identified.    Interventions Relaxation education;Encouraged to attend Pulmonary Rehabilitation for the exercise;Stress management education Relaxation education;Encouraged to attend Pulmonary Rehabilitation for the exercise;Stress management education Relaxation education;Encouraged to attend Pulmonary Rehabilitation for the exercise;Stress management education Relaxation education;Encouraged to attend Pulmonary Rehabilitation for the exercise;Stress management education    Continue Psychosocial Services  No Follow up required No Follow up required No Follow up required No Follow up required             Psychosocial Discharge (Final Psychosocial Re-Evaluation):  Psychosocial Re-Evaluation - 09/03/21 0957       Psychosocial Re-Evaluation   Current issues with None Identified    Comments Patient has completed 27 sessions and continues to have no psychosocial issues identified.   She seems to  enjoy coming to the program. Patient very motivated and works hard during exercise.  She don't speak english (very little).  Interpreter is here during class .  We will continue to monitor and also she will continue to have no psychosocial issuses identified.    Expected Outcomes Patient will have no psychosocial issuses identified.    Interventions Relaxation  education;Encouraged to attend Pulmonary Rehabilitation for the exercise;Stress management education    Continue Psychosocial Services  No Follow up required              Education: Education Goals: Education classes will be provided on a weekly basis, covering required topics. Participant will state understanding/return demonstration of topics presented.  Learning Barriers/Preferences:  Learning Barriers/Preferences - 05/29/21 1318       Learning Barriers/Preferences   Learning Barriers Language   Daughter is here to translate during orientation. Will need a translator for her other visits.   Learning Preferences Audio;Computer/Internet;Group Instruction;Individual Instruction;Pictoral;Skilled Demonstration;Verbal Instruction;Video;Written Material             Education Topics: How Lungs Work and Diseases: - Discuss the anatomy of the lungs and diseases that can affect the lungs, such as COPD. Flowsheet Row PULMONARY REHAB OTHER RESPIRATORY from 09/06/2021 in [place]  Date 07/19/21  Educator pb  Instruction Review Code 1- Verbalizes Understanding       Exercise: -Discuss the importance of exercise, FITT principles of exercise, normal and abnormal responses to exercise, and how to exercise safely.   Environmental Irritants: -Discuss types of environmental irritants and how to limit exposure to environmental irritants. Flowsheet Row PULMONARY REHAB OTHER RESPIRATORY from 09/06/2021 in [place]  Date 08/02/21  Educator hj  Instruction Review Code 1- Verbalizes Understanding       Meds/Inhalers and oxygen: - Discuss respiratory medications, definition of an inhaler and oxygen, and the proper way to use an inhaler and oxygen. Flowsheet Row PULMONARY REHAB OTHER RESPIRATORY from 09/06/2021 in [place]  Date 08/09/21  Educator HJ       Energy Saving Techniques: - Discuss methods to  conserve energy and decrease shortness of breath when performing activities of daily living.  Flowsheet Row PULMONARY REHAB OTHER RESPIRATORY from 09/06/2021 in [place]  Date 08/16/21  Educator hj  Instruction Review Code 1- Verbalizes Understanding       Bronchial Hygiene / Breathing Techniques: - Discuss breathing mechanics, pursed-lip breathing technique,  proper posture, effective ways to clear airways, and other functional breathing techniques Flowsheet Row PULMONARY REHAB OTHER RESPIRATORY from 09/06/2021 in [place]  Date 08/23/21  Educator DM  Instruction Review Code 1- Research scientist (medical): - Provides group verbal and written instruction about the health risks of elevated stress, cause of high stress, and healthy ways to reduce stress. Flowsheet Row PULMONARY REHAB OTHER RESPIRATORY from 09/06/2021 in [place]  Date 08/30/21  Educator DM  Instruction Review Code 1- Verbalizes Understanding       Nutrition I: Fats: - Discuss the types of cholesterol, what cholesterol does to the body, and how cholesterol levels can be controlled. Flowsheet Row PULMONARY REHAB OTHER RESPIRATORY from 09/06/2021 in [place]  Date 06/07/21  Educator pb  Instruction Review Code 1- Verbalizes Understanding       Nutrition II: Labels: -Discuss the different components of food labels and how to read food labels. Flowsheet Row PULMONARY REHAB OTHER RESPIRATORY from  09/06/2021 in [place]  Date 06/14/21  Educator DF  Instruction Review Code 2- Demonstrated Understanding       Respiratory Infections: - Discuss the signs and symptoms of respiratory infections, ways to prevent respiratory infections, and the importance of seeking medical treatment when having a respiratory infection.   Stress I: Signs and Symptoms: - Discuss the causes  of stress, how stress may lead to anxiety and depression, and ways to limit stress. Flowsheet Row PULMONARY REHAB OTHER RESPIRATORY from 09/06/2021 in [place]  Date 06/28/21  Educator pb  Instruction Review Code 1- Verbalizes Understanding       Stress II: Relaxation: -Discuss relaxation techniques to limit stress. Flowsheet Row PULMONARY REHAB OTHER RESPIRATORY from 09/06/2021 in [place]  Date 06/28/21  Educator pb  Instruction Review Code 1- Verbalizes Understanding       Oxygen for Home/Travel: - Discuss how to prepare for travel when on oxygen and proper ways to transport and store oxygen to ensure safety. Flowsheet Row PULMONARY REHAB OTHER RESPIRATORY from 09/06/2021 in [place]  Date 07/12/21  Educator [place]  Instruction Review Code 1- Verbalizes Understanding       Knowledge Questionnaire Score:  Knowledge Questionnaire Score - 05/29/21 1323       Knowledge Questionnaire Score   Pre Score 14/18             Core Components/Risk Factors/Patient Goals at Admission:  Personal Goals and Risk Factors at Admission - 05/29/21 1352       Core Components/Risk Factors/Patient Goals on Admission   Improve shortness of breath with ADL's Yes    Intervention Provide education, individualized exercise plan and daily activity instruction to help decrease symptoms of SOB with activities of daily living.    Expected Outcomes Short Term: Improve cardiorespiratory fitness to achieve a reduction of symptoms when performing ADLs;Long Term: Be able to perform more ADLs without symptoms or delay the onset of symptoms             Core Components/Risk Factors/Patient Goals Review:   Goals and Risk Factor Review     Row Name 06/12/21 1016 07/04/21 1503 08/06/21 1143 09/03/21 0958       Core Components/Risk Factors/Patient Goals Review   Personal Goals Review Improve shortness of breath with ADL's  Improve shortness of breath with ADL's Improve shortness of breath with ADL's Improve shortness of breath with ADL's    Review Patient is new in the program completing 3 sessions.  She was referred to PR with ILD by Dr. Chase Caller.  Her personal goals for the program are to decrease SOB  with exertion .  We will encourage her progress as she meets these goals. Patient has completed 9 sessions.    She was referred to PR with ILD by Dr. Chase Caller.  Her personal goals for the program are to decrease SOB  with exertion .  Continue to use spanish interpertor, speaks very little english.  She is doing well in program with progression and consistent attendance.  She tolerates exercise well on TM and Nu-step with O2 sats averaging 91%-95% with room air.   We will encourage her progress as she meets these goals. Patient has completed 18 sessions.    She was referred to PR with ILD by Dr. Chase Caller.  Her personal goals for the program are to decrease SOB  with exertion .  Continue to use spanish interpertor, speaks very little english.  She is  doing well in program with progression and consistent attendance.  She tolerates exercise well on TM and Nu-step with O2 sats averaging 91%-95% with room air.   We will encourage her progress as she meets these goals. Patient has completed 27 sessions.  Her personal goals for the program are to decrease SOB  with exertion .  Continue to use Spanish Interpertor, speaks very little [place].  She is doing well in program with progression and consistent attendance.  She tolerates exercise well on TM and Nu-step with O2 sats averaging 93%-98% with room air.   We will encourage her progress as she meets these goals.    Expected Outcomes Patient will complete the program meeting both program and personal goals. Patient will complete the program meeting both program and personal goals. Patient will complete the program meeting both program and personal goals. Patient will complete the program  meeting both program and personal goals.             Core Components/Risk Factors/Patient Goals at Discharge (Final Review):   Goals and Risk Factor Review - 09/03/21 0958       Core Components/Risk Factors/Patient Goals Review   Personal Goals Review Improve shortness of breath with ADL's    Review Patient has completed 27 sessions.  Her personal goals for the program are to decrease SOB  with exertion .  Continue to use Spanish Interpertor, speaks very little [place].  She is doing well in program with progression and consistent attendance.  She tolerates exercise well on TM and Nu-step with O2 sats averaging 93%-98% with room air.   We will encourage her progress as she meets these goals.    Expected Outcomes Patient will complete the program meeting both program and personal goals.             ITP Comments:   Comments: ITP REVIEW Pt is making expected progress toward pulmonary rehab goals after completing 30 sessions. Recommend continued exercise, life style modification, education, and utilization of breathing techniques to increase stamina and strength and decrease shortness of breath with exertion.

## 2021-09-13 ENCOUNTER — Encounter (HOSPITAL_COMMUNITY)
Admission: RE | Admit: 2021-09-13 | Discharge: 2021-09-13 | Disposition: A | Discharge status: Home / Self Care | Payer: Medicare Other | Source: Ambulatory Visit | Attending: Internal Medicine | Admitting: Internal Medicine

## 2021-09-13 DIAGNOSIS — J849 Interstitial pulmonary disease, unspecified: Secondary | ICD-10-CM

## 2021-09-13 NOTE — Progress Notes (Addendum)
Daily Session Note  Patient Details  Name: Tasha [name] MRN: [id number] Date of Birth: 1950-09-03 Referring Provider:   Flowsheet Row PULMONARY REHAB OTHER RESP ORIENTATION from 05/29/2021 in [place]  Referring Provider Dr. Chase Caller       Encounter Date: 09/13/2021  Check In:  Session Check In - 09/13/21 1329       Check-In   Supervising physician immediately available to respond to emergencies [facility] MD immediately available    Physician(s) Dr Johney Frame    Location [facility]    Staff Present Meridian Scherger Hassell Done, RN, Bjorn Loser, MS, ACSM-CEP, Exercise Physiologist;Heather Zigmund Daniel, Exercise Physiologist    Virtual Visit No    Medication changes reported     No    Fall or balance concerns reported    No    Tobacco Cessation No Change    Warm-up and Cool-down Performed as group-led instruction    Resistance Training Performed Yes    VAD Patient? No    PAD/SET Patient? No      Pain Assessment   Currently in Pain? No/denies    Multiple Pain Sites No             Capillary Blood Glucose: No results found for this or any previous visit (from the past 24 hour(s)).    Social History   Tobacco Use  Smoking Status Former  Smokeless Tobacco Never    Goals Met:  Independence with exercise equipment Exercise tolerated well No report of concerns or symptoms today Strength training completed today  Goals Unmet:  Not Applicable  Comments: Checkout at 1430.   Dr. Kathie Dike is Medical Director for [facility].

## 2021-09-18 ENCOUNTER — Encounter (HOSPITAL_COMMUNITY)
Admission: RE | Admit: 2021-09-18 | Discharge: 2021-09-18 | Disposition: A | Discharge status: Home / Self Care | Payer: Medicare Other | Source: Ambulatory Visit | Attending: Internal Medicine | Admitting: Internal Medicine

## 2021-09-18 DIAGNOSIS — J849 Interstitial pulmonary disease, unspecified: Secondary | ICD-10-CM

## 2021-09-18 NOTE — Progress Notes (Signed)
Daily Session Note  Patient Details  Name: Tasha [name] MRN: [id number] Date of Birth: 1950-08-08 Referring Provider:   Flowsheet Row PULMONARY REHAB OTHER RESP ORIENTATION from 05/29/2021 in [place]  Referring Provider Dr. Chase Caller       Encounter Date: 09/18/2021  Check In:  Session Check In - 09/18/21 1329       Check-In   Supervising physician immediately available to respond to emergencies [facility] MD immediately available    Physician(s) Dr Harl Bowie    Location [facility]    Staff Present Trameka Dorough Hassell Done, RN, Bjorn Loser, MS, ACSM-CEP, Exercise Physiologist;Heather Zigmund Daniel, Exercise Physiologist    Virtual Visit No    Medication changes reported     No    Tobacco Cessation No Change    Warm-up and Cool-down Performed as group-led instruction    Resistance Training Performed Yes    VAD Patient? No    PAD/SET Patient? No      Pain Assessment   Currently in Pain? No/denies    Multiple Pain Sites No             Capillary Blood Glucose: No results found for this or any previous visit (from the past 24 hour(s)).    Social History   Tobacco Use  Smoking Status Former  Smokeless Tobacco Never    Goals Met:  Independence with exercise equipment Exercise tolerated well No report of concerns or symptoms today Strength training completed today  Goals Unmet:  Not Applicable  Comments: checkout at 1430.   Dr. Kathie Dike is Medical Director for [facility].

## 2021-09-20 ENCOUNTER — Encounter (HOSPITAL_COMMUNITY)
Admission: RE | Admit: 2021-09-20 | Discharge: 2021-09-20 | Disposition: A | Discharge status: Home / Self Care | Payer: Medicare Other | Source: Ambulatory Visit | Attending: Internal Medicine | Admitting: Internal Medicine

## 2021-09-20 DIAGNOSIS — J849 Interstitial pulmonary disease, unspecified: Secondary | ICD-10-CM | POA: Diagnosis not present

## 2021-09-20 NOTE — Progress Notes (Signed)
Daily Session Note  Patient Details  Name: Tasha [name] MRN: [id number] Date of Birth: 1950-08-19 Referring Provider:   Flowsheet Row PULMONARY REHAB OTHER RESP ORIENTATION from 05/29/2021 in [place]  Referring Provider Dr. Chase Caller       Encounter Date: 09/20/2021  Check In:  Session Check In - 09/20/21 1317       Check-In   Supervising physician immediately available to respond to emergencies [facility] MD immediately available    Physician(s) Dr Harl Bowie    [facility]    Staff Present Geanie Cooley, RN;Johnnye Sandford Otho Ket, BS, Exercise Physiologist;Debra Wynetta Emery, RN, Joanette Gula, RN, Bjorn Loser, MS, ACSM-CEP, Exercise Physiologist    Virtual Visit No    Medication changes reported     No    Fall or balance concerns reported    No    Tobacco Cessation No Change    Warm-up and Cool-down Performed as group-led instruction    Resistance Training Performed Yes    VAD Patient? No    PAD/SET Patient? No      Pain Assessment   Currently in Pain? No/denies    Multiple Pain Sites No             Capillary Blood Glucose: No results found for this or any previous visit (from the past 24 hour(s)).    Social History   Tobacco Use  Smoking Status Former  Smokeless Tobacco Never    Goals Met:  Independence with exercise equipment Exercise tolerated well No report of concerns or symptoms today Strength training completed today  Goals Unmet:  Not Applicable  Comments: check out 1430   Dr. Kathie Dike is Medical Director for [facility] Pul[facility] Re[facility].

## 2021-09-21 ENCOUNTER — Ambulatory Visit (INDEPENDENT_AMBULATORY_CARE_PROVIDER_SITE_OTHER): Payer: Medicare Other | Admitting: Family Medicine

## 2021-09-21 ENCOUNTER — Encounter: Payer: Self-pay | Admitting: Family Medicine

## 2021-09-21 VITALS — BP 118/67 | HR 80 | Temp 97.8°F | Ht 61.0 in | Wt 136.0 lb

## 2021-09-21 DIAGNOSIS — R7303 Prediabetes: Secondary | ICD-10-CM | POA: Diagnosis not present

## 2021-09-21 DIAGNOSIS — E785 Hyperlipidemia, unspecified: Secondary | ICD-10-CM | POA: Diagnosis not present

## 2021-09-21 DIAGNOSIS — I2510 Atherosclerotic heart disease of native coronary artery without angina pectoris: Secondary | ICD-10-CM | POA: Diagnosis not present

## 2021-09-21 DIAGNOSIS — E039 Hypothyroidism, unspecified: Secondary | ICD-10-CM

## 2021-09-21 DIAGNOSIS — I4819 Other persistent atrial fibrillation: Secondary | ICD-10-CM | POA: Diagnosis not present

## 2021-09-21 DIAGNOSIS — I10 Essential (primary) hypertension: Secondary | ICD-10-CM | POA: Diagnosis not present

## 2021-09-21 LAB — BAYER DCA HB A1C WAIVED: HB A1C (BAYER DCA - WAIVED): 5.1 % (ref 4.8–5.6)

## 2021-09-21 NOTE — Progress Notes (Signed)
BP 118/67   Pulse 80   Temp 97.8 F (36.6 C)   Ht _0  (1.549 m)   Wt 136 lb (61.7 kg)   SpO2 99%   BMI 25.70 kg/m    Subjective:   [name], female    DOB: 1950-11-02, 71 y.o.   MRN: [id number]  HPI: Tasha [name] is a 71 y.o. female presenting on 09/21/2021 for Medical Management of Chronic Issues, Hypertension, and Prediabetes   HPI Prediabetes  patient comes in today for recheck of his diabetes. Patient has been currently taking no medicine has been diet controlled, will check A1c today. Patient is currently on an ACE inhibitor/ARB. Patient has not seen an ophthalmologist this year. Patient denies any issues with their feet. The symptom started onset as an adult hypertension and hyperlipidemia and hypothyroidism ARE RELATED TO DM   Hypertension Patient is currently on losartan and hydrochlorothiazide, and their blood pressure today is 118/67. Patient denies any lightheadedness or dizziness. Patient denies headaches, blurred vision, chest pains, shortness of breath, or weakness. Denies any side effects from medication and is content with current medication.   Hyperlipidemia Patient is coming in for recheck of his hyperlipidemia. The patient is currently taking fish oils and atorvastatin. They deny any issues with myalgias or history of liver damage from it. They deny any focal numbness or weakness or chest pain.  Hypothyroidism recheck Patient is coming in for thyroid recheck today as well. They deny any issues with hair changes or heat or cold problems or diarrhea or constipation. They deny any chest pain or palpitations. They are currently on levothyroxine 50 micrograms   Relevant past medical, surgical, family and social history reviewed and updated as indicated. Interim medical history since our last visit reviewed. Allergies and medications reviewed and updated.  Review of Systems  Constitutional:  Negative for chills and fever.  Eyes:  Negative for  redness and visual disturbance.  Respiratory:  Negative for chest tightness and shortness of breath.   Cardiovascular:  Negative for chest pain and leg swelling.  Genitourinary:  Negative for difficulty urinating and dysuria.  Musculoskeletal:  Negative for back pain and gait problem.  Skin:  Negative for rash.  Neurological:  Negative for dizziness, light-headedness and headaches.  Psychiatric/Behavioral:  Negative for agitation and behavioral problems.   All other systems reviewed and are negative.  Per HPI unless specifically indicated above   Allergies as of 09/21/2021       Reactions   Hydromet [hydrocodone Bit-homatrop Mbr]    Pt doesn't remember   Toprol Xl [metoprolol] Hives   Facial rash, itching, hives   Amoxicillin Rash   Did it involve swelling of the face/tongue/throat, SOB, or low BP? No Did it involve sudden or severe rash/hives, skin peeling, or any reaction on the inside of your mouth or nose? Unknown Did you need to seek medical attention at a hospital or doctor's office? Unknown When did it last happen?      10 + years If all above answers are "NO", may proceed with cephalosporin use.        Medication List        Accurate as of September 21, 2021 10:49 AM. If you have any questions, ask your nurse or doctor.          acetaminophen 500 MG tablet Commonly known as: TYLENOL Take 1 tablet (500 mg total) by mouth every 6 (six) hours as needed.   aspirin EC 81 MG tablet Take  1 tablet (81 mg total) by mouth daily.   atorvastatin 20 MG tablet Commonly known as: LIPITOR Take 1 tablet (20 mg total) by mouth daily at 6 PM.   budesonide 180 MCG/ACT inhaler Commonly known as: PULMICORT Inhale 2 puffs into the lungs in the morning and at bedtime.   cetirizine 10 MG tablet Commonly known as: ZYRTEC Take 1 tablet (10 mg total) by mouth daily as needed for allergies.   Combivent Respimat 20-100 MCG/ACT Aers respimat Generic drug: Ipratropium-Albuterol Inhale 1  puff into the lungs every 6 (six) hours as needed for wheezing.   hydrochlorothiazide 25 MG tablet Commonly known as: HYDRODIURIL Take 1 tablet (25 mg total) by mouth daily.   ibuprofen 200 MG tablet Commonly known as: ADVIL Take 400 mg by mouth every 6 (six) hours as needed for headache or moderate pain.   levothyroxine 50 MCG tablet Commonly known as: SYNTHROID Take 1 tablet (50 mcg total) by mouth daily before breakfast.   losartan 50 MG tablet Commonly known as: COZAAR Take 1 tablet (50 mg total) by mouth daily.   Omega 3 1000 MG Caps Take 1,000 mg by mouth daily.   ondansetron 8 MG disintegrating tablet Commonly known as: ZOFRAN-ODT Take 1 tablet (8 mg total) by mouth every 6 (six) hours as needed for nausea or vomiting.   pantoprazole 40 MG tablet Commonly known as: PROTONIX Take 1 tablet (40 mg total) by mouth daily.   potassium chloride SA 20 MEQ tablet Commonly known as: KLOR-CON M Take 1 tablet (20 mEq total) by mouth daily.         Objective:   BP 118/67   Pulse 80   Temp 97.8 F (36.6 C)   Ht _0  (1.549 m)   Wt 136 lb (61.7 kg)   SpO2 99%   BMI 25.70 kg/m   Wt Readings from Last 3 Encounters:  09/21/21 136 lb (61.7 kg)  09/11/21 136 lb 0.4 oz (61.7 kg)  08/28/21 136 lb 11 oz (62 kg)    Physical Exam Vitals and nursing note reviewed.  Constitutional:      General: She is not in acute distress.    Appearance: She is well-developed. She is not diaphoretic.  Eyes:     Conjunctiva/sclera: Conjunctivae normal.  Cardiovascular:     Rate and Rhythm: Normal rate and regular rhythm.     Heart sounds: Normal heart sounds. No murmur heard. Pulmonary:     Effort: Pulmonary effort is normal. No respiratory distress.     Breath sounds: Normal breath sounds. No wheezing.  Musculoskeletal:        General: No tenderness. Normal range of motion.  Skin:    General: Skin is warm and dry.     Findings: No rash.  Neurological:     Mental Status: She is  alert and oriented to person, place, and time.     Coordination: Coordination normal.  Psychiatric:        Behavior: Behavior normal.      Assessment & Plan:   Problem List Items Addressed This Visit       Cardiovascular and Mediastinum   Hypertension - Primary   Relevant Orders   CBC with Differential/Platelet   CMP14+EGFR   Lipid panel   Bayer DCA Hb A1c Waived   TSH   Persistent atrial fibrillation (HCC)   Relevant Orders   CBC with Differential/Platelet   CMP14+EGFR   Lipid panel   Bayer DCA Hb A1c Waived   TSH  Endocrine   Hypothyroidism   Relevant Orders   CBC with Differential/Platelet   CMP14+EGFR   Lipid panel   Bayer DCA Hb A1c Waived   TSH     Other   Hyperlipidemia with target LDL less than 100   Relevant Orders   CBC with Differential/Platelet   CMP14+EGFR   Lipid panel   Bayer DCA Hb A1c Waived   TSH   Prediabetes   Relevant Orders   CBC with Differential/Platelet   CMP14+EGFR   Lipid panel   Bayer DCA Hb A1c Waived   TSH    She does have some allergy symptoms and takes Zyrtec but recommended she try Flonase to see if it would help.  Blood pressure looks good, will check blood work today, A1c is pending and blood work is pending. Follow up plan: Return in about 3 months (around 12/22/2021), or if symptoms worsen or fail to improve, for Prediabetes and hypertension and cholesterol.  Counseling provided for all of the vaccine components Orders Placed This Encounter  Procedures   CBC with Differential/Platelet   CMP14+EGFR   Lipid panel   Bayer DCA Hb A1c Waived   TSH    Caryl Pina, MD [facility] 09/21/2021, 10:49 AM

## 2021-09-22 LAB — CBC WITH DIFFERENTIAL/PLATELET
Basophils Absolute: 0 10*3/uL (ref 0.0–0.2)
Basos: 0 %
EOS (ABSOLUTE): 0.2 10*3/uL (ref 0.0–0.4)
Eos: 2 %
Hematocrit: 40 % (ref 34.0–46.6)
Hemoglobin: 13.3 g/dL (ref 11.1–15.9)
Immature Grans (Abs): 0 10*3/uL (ref 0.0–0.1)
Immature Granulocytes: 0 %
Lymphocytes Absolute: 1.2 10*3/uL (ref 0.7–3.1)
Lymphs: 16 %
MCH: 31 pg (ref 26.6–33.0)
MCHC: 33.3 g/dL (ref 31.5–35.7)
MCV: 93 fL (ref 79–97)
Monocytes Absolute: 1 10*3/uL — ABNORMAL HIGH (ref 0.1–0.9)
Monocytes: 14 %
Neutrophils Absolute: 5.1 10*3/uL (ref 1.4–7.0)
Neutrophils: 68 %
Platelets: 159 10*3/uL (ref 150–450)
RBC: 4.29 x10E6/uL (ref 3.77–5.28)
RDW: 13.1 % (ref 11.7–15.4)
WBC: 7.6 10*3/uL (ref 3.4–10.8)

## 2021-09-22 LAB — CMP14+EGFR
ALT: 95 IU/L — ABNORMAL HIGH (ref 0–32)
AST: 73 IU/L — ABNORMAL HIGH (ref 0–40)
Albumin/Globulin Ratio: 2.1 (ref 1.2–2.2)
Albumin: 4.1 g/dL (ref 3.7–4.7)
Alkaline Phosphatase: 63 IU/L (ref 44–121)
BUN/Creatinine Ratio: 26 (ref 12–28)
BUN: 21 mg/dL (ref 8–27)
Bilirubin Total: 0.3 mg/dL (ref 0.0–1.2)
CO2: 25 mmol/L (ref 20–29)
Calcium: 9.6 mg/dL (ref 8.7–10.3)
Chloride: 104 mmol/L (ref 96–106)
Creatinine, Ser: 0.81 mg/dL (ref 0.57–1.00)
Globulin, Total: 2 g/dL (ref 1.5–4.5)
Glucose: 97 mg/dL (ref 70–99)
Potassium: 3.7 mmol/L (ref 3.5–5.2)
Sodium: 141 mmol/L (ref 134–144)
Total Protein: 6.1 g/dL (ref 6.0–8.5)
eGFR: 78 mL/min/{1.73_m2} (ref >59)

## 2021-09-22 LAB — LIPID PANEL
Chol/HDL Ratio: 3 ratio (ref 0.0–4.4)
Cholesterol, Total: 112 mg/dL (ref 100–199)
HDL: 37 mg/dL — ABNORMAL LOW (ref >39)
LDL Chol Calc (NIH): 58 mg/dL (ref 0–99)
Triglycerides: 83 mg/dL (ref 0–149)
VLDL Cholesterol Cal: 17 mg/dL (ref 5–40)

## 2021-09-22 LAB — TSH: TSH: 1.44 u[IU]/mL (ref 0.450–4.500)

## 2021-09-25 ENCOUNTER — Ambulatory Visit: Payer: Medicare Other

## 2021-09-25 ENCOUNTER — Encounter (HOSPITAL_COMMUNITY)
Admission: RE | Admit: 2021-09-25 | Discharge: 2021-09-25 | Disposition: A | Discharge status: Home / Self Care | Payer: Medicare Other | Source: Ambulatory Visit | Attending: Internal Medicine | Admitting: Internal Medicine

## 2021-09-25 VITALS — Wt 136.2 lb

## 2021-09-25 DIAGNOSIS — J849 Interstitial pulmonary disease, unspecified: Secondary | ICD-10-CM

## 2021-09-25 NOTE — Progress Notes (Signed)
Daily Session Note  Patient Details  Name: Tasha [name] MRN: [id number] Date of Birth: [birth year] Referring Provider:   Flowsheet Row PULMONARY REHAB OTHER RESP ORIENTATION from 05/29/2021 in [place]  Referring Provider Dr. Chase Caller       Encounter Date: 09/25/2021  Check In:  Session Check In - 09/25/21 1327       Check-In   Supervising physician immediately available to respond to emergencies [facility] MD immediately available    Physician(s) Dr Johnsie Cancel    Location [facility]    Staff Present Geanie Cooley, RN;Heather Otho Ket, BS, Exercise Physiologist;Tkai Large Hassell Done, RN, Bjorn Loser, MS, ACSM-CEP, Exercise Physiologist    Virtual Visit No    Medication changes reported     No    Fall or balance concerns reported    No    Tobacco Cessation No Change    Warm-up and Cool-down Performed as group-led instruction    Resistance Training Performed Yes    VAD Patient? No    PAD/SET Patient? No      Pain Assessment   Currently in Pain? No/denies    Multiple Pain Sites No             Capillary Blood Glucose: No results found for this or any previous visit (from the past 24 hour(s)).    Social History   Tobacco Use  Smoking Status Former  Smokeless Tobacco Never    Goals Met:  Independence with exercise equipment Exercise tolerated well No report of concerns or symptoms today Strength training completed today  Goals Unmet:  Not Applicable  Comments: checkout at 1430.   Dr. Kathie Dike is Medical Director for [facility] Pulmonary Rehab.

## 2021-09-27 ENCOUNTER — Encounter (HOSPITAL_COMMUNITY)
Admission: RE | Admit: 2021-09-27 | Discharge: 2021-09-27 | Disposition: A | Discharge status: Home / Self Care | Payer: Medicare Other | Source: Ambulatory Visit | Attending: Internal Medicine | Admitting: Internal Medicine

## 2021-09-27 VITALS — Ht 61.0 in | Wt 136.2 lb

## 2021-09-27 DIAGNOSIS — J849 Interstitial pulmonary disease, unspecified: Secondary | ICD-10-CM | POA: Diagnosis not present

## 2021-09-27 NOTE — Progress Notes (Signed)
Daily Session Note  Patient Details  Name: Tasha [name] MRN: [id number] Date of Birth: 01/26/1951 Referring Provider:   Flowsheet Row PULMONARY REHAB OTHER RESP ORIENTATION from 05/29/2021 in [place]  Referring Provider Dr. Chase Caller       Encounter Date: 09/27/2021  Check In:  Session Check In - 09/27/21 1325       Check-In   Supervising physician immediately available to respond to emergencies [facility] MD immediately available    Physician(s) Dr Domenic Polite    Location [facility]    Staff Present Redge Gainer, BS, Exercise Physiologist;Debra Wynetta Emery, RN, Joanette Gula, RN, BSN    Virtual Visit No    Medication changes reported     No    Fall or balance concerns reported    No    Tobacco Cessation No Change    Warm-up and Cool-down Performed as group-led instruction    Resistance Training Performed Yes    VAD Patient? No    PAD/SET Patient? No      Pain Assessment   Currently in Pain? No/denies    Multiple Pain Sites No             Capillary Blood Glucose: No results found for this or any previous visit (from the past 24 hour(s)).    Social History   Tobacco Use  Smoking Status Former  Smokeless Tobacco Never    Goals Met:  Independence with exercise equipment Exercise tolerated well No report of concerns or symptoms today Strength training completed today  Goals Unmet:  Not Applicable  Comments: checkout at 1430.   Dr. Kathie Dike is Medical Director for [facility] Pulmonary Rehab.

## 2021-10-01 ENCOUNTER — Other Ambulatory Visit: Payer: Self-pay

## 2021-10-01 DIAGNOSIS — R7989 Other specified abnormal findings of blood chemistry: Secondary | ICD-10-CM

## 2021-10-02 ENCOUNTER — Encounter (HOSPITAL_COMMUNITY)
Admission: RE | Admit: 2021-10-02 | Discharge: 2021-10-02 | Disposition: A | Discharge status: Home / Self Care | Payer: Medicare Other | Source: Ambulatory Visit | Attending: Internal Medicine | Admitting: Internal Medicine

## 2021-10-02 DIAGNOSIS — J849 Interstitial pulmonary disease, unspecified: Secondary | ICD-10-CM

## 2021-10-02 NOTE — Progress Notes (Signed)
Daily Session Note  Patient Details  Name: Tasha [name] MRN: [id number] Date of Birth: May 09, 1950 Referring Provider:   Flowsheet Row PULMONARY REHAB OTHER RESP ORIENTATION from 05/29/2021 in [place]  Referring Provider Dr. Chase Caller       Encounter Date: 10/02/2021  Check In:  Session Check In - 10/02/21 1322       Check-In   Supervising physician immediately available to respond to emergencies [facility] MD immediately available    Physician(s) Dr Audie Box    Location [facility]    Staff Present Geanie Cooley, RN;Heather Otho Ket, BS, Exercise Physiologist;Henreitta Spittler Hassell Done, RN, Bjorn Loser, MS, ACSM-CEP, Exercise Physiologist    Virtual Visit No    Medication changes reported     No    Fall or balance concerns reported    No    Tobacco Cessation No Change    Warm-up and Cool-down Performed as group-led instruction    Resistance Training Performed Yes    VAD Patient? No    PAD/SET Patient? No      Pain Assessment   Currently in Pain? No/denies    Multiple Pain Sites No             Capillary Blood Glucose: No results found for this or any previous visit (from the past 24 hour(s)).    Social History   Tobacco Use  Smoking Status Former  Smokeless Tobacco Never    Goals Met:  Independence with exercise equipment Exercise tolerated well No report of concerns or symptoms today Strength training completed today  Goals Unmet:  Not Applicable  Comments: Checkout at 1430.   Dr. Kathie Dike is Medical Director for [facility] Pulmonary Rehab.

## 2021-10-03 NOTE — Progress Notes (Signed)
Discharge Progress Report  Patient Details  Name: Tasha [name] MRN: [id number] Date of Birth: [birth year] Referring Provider:   Flowsheet Row PULMONARY REHAB OTHER RESP ORIENTATION from 05/29/2021 in [place]  Referring Provider Dr. Chase Caller        Number of Visits: 36  Reason for Discharge:  Patient reached a stable level of exercise. Patient independent in their exercise. Patient has met program and personal goals.  Smoking History:  Social History   Tobacco Use  Smoking Status Former  Smokeless Tobacco Never    Diagnosis:  Interstitial lung disease ([place])  ADL UCSD:  Pulmonary Assessment Scores     Row Name 05/29/21 1305 10/03/21 1407       ADL UCSD   ADL Phase Entry --    SOB Score total 13 2    Rest 0 0    Walk 1 0    Stairs 4 0    Bath 0 0    Dress 0 0    Shop 0 0      CAT Score   CAT Score 12 6      mMRC Score   mMRC Score 2 1             Initial Exercise Prescription:  Initial Exercise Prescription - 05/29/21 1400       Date of Initial Exercise RX and Referring Provider   Date 05/29/21    Referring Provider Dr. Chase Caller    Expected Discharge Date 10/02/21      Treadmill   MPH 2    Grade 0    Minutes 17      NuStep   Level 1    SPM 80    Minutes 22      Prescription Details   Frequency (times per week) 2    Duration Progress to 30 minutes of continuous aerobic without signs/symptoms of physical distress      Intensity   THRR 40-80% of Max Heartrate 60-120    Ratings of Perceived Exertion 11-13    Perceived Dyspnea 0-4      Resistance Training   Training Prescription Yes    Weight 3 lbs    Reps 10-15             Discharge Exercise Prescription (Final Exercise Prescription Changes):  Exercise Prescription Changes - 09/25/21 1500       Response to Exercise   Blood Pressure (Admit) 106/60    Blood Pressure (Exercise) 140/64    Blood Pressure (Exit) 120/60    Heart Rate (Admit) 85 bpm     Heart Rate (Exercise) 116 bpm    Heart Rate (Exit) 94 bpm    Oxygen Saturation (Admit) 98 %    Oxygen Saturation (Exercise) 92 %    Oxygen Saturation (Exit) 97 %    Rating of Perceived Exertion (Exercise) 14    Perceived Dyspnea (Exercise) 14    Duration Continue with 30 min of aerobic exercise without signs/symptoms of physical distress.    Intensity THRR unchanged      Progression   Progression Continue to progress workloads to maintain intensity without signs/symptoms of physical distress.      Resistance Training   Training Prescription Yes    Weight 3    Reps 10-15    Time 10 Minutes      Treadmill   MPH 3    Grade 3    Minutes 17    METs 4.54      NuStep  Level 4    SPM 84    Minutes 22    METs 2.5             Functional Capacity:  6 Minute Walk     Row Name 05/29/21 1456 09/27/21 1357       6 Minute Walk   Phase Initial Discharge    Distance 1150 feet 1300 feet    Walk Time 6 minutes 6 minutes    # of Rest Breaks 0 0    MPH 2.18 2.46    METS 2.74 2.96    RPE 10 9    Perceived Dyspnea  10 9    VO2 Peak 9.58 10.36    Symptoms No No    Resting HR 88 bpm 78 bpm    Resting BP 138/82 110/60    Resting Oxygen Saturation  95 % 98 %    Exercise Oxygen Saturation  during 6 min walk 93 % 94 %    Max Ex. HR 111 bpm 108 bpm    Max Ex. BP 140/84 140/70    2 Minute Post BP 130/78 123/66      Interval HR   1 Minute HR 105 102    2 Minute HR 110 108    3 Minute HR 111 108    4 Minute HR 108 110    5 Minute HR 111 107    6 Minute HR 110 108    2 Minute Post HR 92 87    Interval Heart Rate? Yes Yes      Interval Oxygen   Interval Oxygen? Yes Yes    Baseline Oxygen Saturation % 95 % 98 %    1 Minute Oxygen Saturation % 93 % 94 %    1 Minute Liters of Oxygen 0 L 0 L    2 Minute Oxygen Saturation % 94 % 94 %    2 Minute Liters of Oxygen 0 L 0 L    3 Minute Oxygen Saturation % 93 % 94 %    3 Minute Liters of Oxygen 0 L 0 L    4 Minute Oxygen  Saturation % 93 % 95 %    4 Minute Liters of Oxygen 0 L 0 L    5 Minute Oxygen Saturation % 93 % 94 %    5 Minute Liters of Oxygen 0 L 0 L    6 Minute Oxygen Saturation % 93 % 95 %    6 Minute Liters of Oxygen 0 L 0 L    2 Minute Post Oxygen Saturation % 96 % 97 %    2 Minute Post Liters of Oxygen 0 L 0 L             Psychological, QOL, Others - Outcomes: PHQ 2/9:    10/03/2021    2:08 PM 09/21/2021   10:32 AM 06/21/2021    1:06 PM 06/21/2021    1:05 PM 05/29/2021   12:58 PM  Depression screen PHQ 2/9  Decreased Interest 0 0  0 0  Down, Depressed, Hopeless 0 0  0 0  PHQ - 2 Score 0 0  0 0  Altered sleeping 0  0  2  Tired, decreased energy 0  0  0  Change in appetite 0  0  0  Feeling bad or failure about yourself  0  0  0  Trouble concentrating 0  0  0  Moving slowly or fidgety/restless 0  0  2  Suicidal  thoughts 0  0  0  PHQ-9 Score 0    4  Difficult doing work/chores     Not difficult at all    Quality of Life:  Quality of Life - 10/02/21 1409       Quality of Life   Select Quality of Life      Quality of Life Scores   Health/Function Pre 20.25 %    Health/Function Post 25.5 %    Health/Function % Change 25.93 %    Socioeconomic Pre 20.64 %    Socioeconomic Post 25 %    Socioeconomic % Change  21.12 %    Psych/Spiritual Pre 19.71 %    Psych/Spiritual Post 30 %    Psych/Spiritual % Change 52.21 %    Family Pre 30 %    Family Post 28.8 %    Family % Change -4 %    GLOBAL Pre 21.61 %    GLOBAL Post 26.82 %    GLOBAL % Change 24.11 %             Personal Goals: Goals established at orientation with interventions provided to work toward goal.  Personal Goals and Risk Factors at Admission - 05/29/21 1352       Core Components/Risk Factors/Patient Goals on Admission   Improve shortness of breath with ADL's Yes    Intervention Provide education, individualized exercise plan and daily activity instruction to help decrease symptoms of SOB with activities of  daily living.    Expected Outcomes Short Term: Improve cardiorespiratory fitness to achieve a reduction of symptoms when performing ADLs;Long Term: Be able to perform more ADLs without symptoms or delay the onset of symptoms              Personal Goals Discharge:  Goals and Risk Factor Review     Row Name 06/12/21 1016 07/04/21 1503 08/06/21 1143 09/03/21 0958 10/03/21 1415     Core Components/Risk Factors/Patient Goals Review   Personal Goals Review Improve shortness of breath with ADL's Improve shortness of breath with ADL's Improve shortness of breath with ADL's Improve shortness of breath with ADL's Improve shortness of breath with ADL's   Review Patient is new in the program completing 3 sessions.  She was referred to PR with ILD by Dr. Chase Caller.  Her personal goals for the program are to decrease SOB  with exertion .  We will encourage her progress as she meets these goals. Patient has completed 9 sessions.    She was referred to PR with ILD by Dr. Chase Caller.  Her personal goals for the program are to decrease SOB  with exertion .  Continue to use spanish interpertor, speaks very little english.  She is doing well in program with progression and consistent attendance.  She tolerates exercise well on TM and Nu-step with O2 sats averaging 91%-95% with room air.   We will encourage her progress as she meets these goals. Patient has completed 18 sessions.    She was referred to PR with ILD by Dr. Chase Caller.  Her personal goals for the program are to decrease SOB  with exertion .  Continue to use spanish interpertor, speaks very little english.  She is doing well in program with progression and consistent attendance.  She tolerates exercise well on TM and Nu-step with O2 sats averaging 91%-95% with room air.   We will encourage her progress as she meets these goals. Patient has completed 27 sessions.  Her personal goals for the program  are to decrease SOB  with exertion .  Continue to use Spanish  Interpertor, speaks very little [place].  She is doing well in program with progression and consistent attendance.  She tolerates exercise well on TM and Nu-step with O2 sats averaging 93%-98% with room air.   We will encourage her progress as she meets these goals. Pt graduated after 33 sessions. She had good attendance and gave good effort. All vitals were WNLs while she was in the program. She reports that her SOB has decreased with exertion and she continues to exercise several days per week outside of rehab.   Expected Outcomes Patient will complete the program meeting both program and personal goals. Patient will complete the program meeting both program and personal goals. Patient will complete the program meeting both program and personal goals. Patient will complete the program meeting both program and personal goals. Pt will continue to work towards their goals post discharge.            Exercise Goals and Review:  Exercise Goals     Row Name 05/29/21 1501 06/19/21 1454 07/17/21 1447 08/14/21 1550 09/11/21 1535     Exercise Goals   Increase Physical Activity Yes Yes Yes Yes Yes   Intervention Provide advice, education, support and counseling about physical activity/exercise needs.;Develop an individualized exercise prescription for aerobic and resistive training based on initial evaluation findings, risk stratification, comorbidities and participant's personal goals. Provide advice, education, support and counseling about physical activity/exercise needs.;Develop an individualized exercise prescription for aerobic and resistive training based on initial evaluation findings, risk stratification, comorbidities and participant's personal goals. Provide advice, education, support and counseling about physical activity/exercise needs.;Develop an individualized exercise prescription for aerobic and resistive training based on initial evaluation findings, risk stratification, comorbidities and  participant's personal goals. Provide advice, education, support and counseling about physical activity/exercise needs.;Develop an individualized exercise prescription for aerobic and resistive training based on initial evaluation findings, risk stratification, comorbidities and participant's personal goals. Provide advice, education, support and counseling about physical activity/exercise needs.;Develop an individualized exercise prescription for aerobic and resistive training based on initial evaluation findings, risk stratification, comorbidities and participant's personal goals.   Expected Outcomes Short Term: Attend rehab on a regular basis to increase amount of physical activity.;Long Term: Exercising regularly at least 3-5 days a week.;Long Term: Add in home exercise to make exercise part of routine and to increase amount of physical activity. Short Term: Attend rehab on a regular basis to increase amount of physical activity.;Long Term: Exercising regularly at least 3-5 days a week.;Long Term: Add in home exercise to make exercise part of routine and to increase amount of physical activity. Short Term: Attend rehab on a regular basis to increase amount of physical activity.;Long Term: Exercising regularly at least 3-5 days a week.;Long Term: Add in home exercise to make exercise part of routine and to increase amount of physical activity. Short Term: Attend rehab on a regular basis to increase amount of physical activity.;Long Term: Exercising regularly at least 3-5 days a week.;Long Term: Add in home exercise to make exercise part of routine and to increase amount of physical activity. Short Term: Attend rehab on a regular basis to increase amount of physical activity.;Long Term: Exercising regularly at least 3-5 days a week.;Long Term: Add in home exercise to make exercise part of routine and to increase amount of physical activity.   Increase Strength and Stamina Yes Yes Yes Yes Yes   Intervention  Provide advice, education, support and  counseling about physical activity/exercise needs.;Develop an individualized exercise prescription for aerobic and resistive training based on initial evaluation findings, risk stratification, comorbidities and participant's personal goals. Provide advice, education, support and counseling about physical activity/exercise needs.;Develop an individualized exercise prescription for aerobic and resistive training based on initial evaluation findings, risk stratification, comorbidities and participant's personal goals. Provide advice, education, support and counseling about physical activity/exercise needs.;Develop an individualized exercise prescription for aerobic and resistive training based on initial evaluation findings, risk stratification, comorbidities and participant's personal goals. Provide advice, education, support and counseling about physical activity/exercise needs.;Develop an individualized exercise prescription for aerobic and resistive training based on initial evaluation findings, risk stratification, comorbidities and participant's personal goals. Provide advice, education, support and counseling about physical activity/exercise needs.;Develop an individualized exercise prescription for aerobic and resistive training based on initial evaluation findings, risk stratification, comorbidities and participant's personal goals.   Expected Outcomes Short Term: Perform resistance training exercises routinely during rehab and add in resistance training at home;Short Term: Increase workloads from initial exercise prescription for resistance, speed, and METs.;Long Term: Improve cardiorespiratory fitness, muscular endurance and strength as measured by increased METs and functional capacity (6MWT) Short Term: Perform resistance training exercises routinely during rehab and add in resistance training at home;Short Term: Increase workloads from initial exercise prescription  for resistance, speed, and METs.;Long Term: Improve cardiorespiratory fitness, muscular endurance and strength as measured by increased METs and functional capacity (6MWT) Short Term: Perform resistance training exercises routinely during rehab and add in resistance training at home;Short Term: Increase workloads from initial exercise prescription for resistance, speed, and METs.;Long Term: Improve cardiorespiratory fitness, muscular endurance and strength as measured by increased METs and functional capacity (6MWT) Short Term: Perform resistance training exercises routinely during rehab and add in resistance training at home;Short Term: Increase workloads from initial exercise prescription for resistance, speed, and METs.;Long Term: Improve cardiorespiratory fitness, muscular endurance and strength as measured by increased METs and functional capacity (6MWT) Short Term: Perform resistance training exercises routinely during rehab and add in resistance training at home;Short Term: Increase workloads from initial exercise prescription for resistance, speed, and METs.;Long Term: Improve cardiorespiratory fitness, muscular endurance and strength as measured by increased METs and functional capacity (6MWT)   Able to understand and use rate of perceived exertion (RPE) scale Yes Yes Yes Yes Yes   Intervention Provide education and explanation on how to use RPE scale Provide education and explanation on how to use RPE scale Provide education and explanation on how to use RPE scale Provide education and explanation on how to use RPE scale Provide education and explanation on how to use RPE scale   Expected Outcomes Long Term:  Able to use RPE to guide intensity level when exercising independently;Short Term: Able to use RPE daily in rehab to express subjective intensity level Long Term:  Able to use RPE to guide intensity level when exercising independently;Short Term: Able to use RPE daily in rehab to express subjective  intensity level Long Term:  Able to use RPE to guide intensity level when exercising independently;Short Term: Able to use RPE daily in rehab to express subjective intensity level Long Term:  Able to use RPE to guide intensity level when exercising independently;Short Term: Able to use RPE daily in rehab to express subjective intensity level Long Term:  Able to use RPE to guide intensity level when exercising independently;Short Term: Able to use RPE daily in rehab to express subjective intensity level   Able to understand and use Dyspnea scale  Yes Yes Yes Yes Yes   Intervention Provide education and explanation on how to use Dyspnea scale Provide education and explanation on how to use Dyspnea scale Provide education and explanation on how to use Dyspnea scale Provide education and explanation on how to use Dyspnea scale Provide education and explanation on how to use Dyspnea scale   Expected Outcomes Short Term: Able to use Dyspnea scale daily in rehab to express subjective sense of shortness of breath during exertion;Long Term: Able to use Dyspnea scale to guide intensity level when exercising independently Short Term: Able to use Dyspnea scale daily in rehab to express subjective sense of shortness of breath during exertion;Long Term: Able to use Dyspnea scale to guide intensity level when exercising independently Short Term: Able to use Dyspnea scale daily in rehab to express subjective sense of shortness of breath during exertion;Long Term: Able to use Dyspnea scale to guide intensity level when exercising independently Short Term: Able to use Dyspnea scale daily in rehab to express subjective sense of shortness of breath during exertion;Long Term: Able to use Dyspnea scale to guide intensity level when exercising independently Short Term: Able to use Dyspnea scale daily in rehab to express subjective sense of shortness of breath during exertion;Long Term: Able to use Dyspnea scale to guide intensity level  when exercising independently   Knowledge and understanding of Target Heart Rate Range (THRR) Yes Yes Yes Yes Yes   Intervention Provide education and explanation of THRR including how the numbers were predicted and where they are located for reference Provide education and explanation of THRR including how the numbers were predicted and where they are located for reference Provide education and explanation of THRR including how the numbers were predicted and where they are located for reference Provide education and explanation of THRR including how the numbers were predicted and where they are located for reference Provide education and explanation of THRR including how the numbers were predicted and where they are located for reference   Expected Outcomes Short Term: Able to state/look up THRR;Long Term: Able to use THRR to govern intensity when exercising independently;Short Term: Able to use daily as guideline for intensity in rehab Short Term: Able to state/look up THRR;Long Term: Able to use THRR to govern intensity when exercising independently;Short Term: Able to use daily as guideline for intensity in rehab Short Term: Able to state/look up THRR;Long Term: Able to use THRR to govern intensity when exercising independently;Short Term: Able to use daily as guideline for intensity in rehab Short Term: Able to state/look up THRR;Long Term: Able to use THRR to govern intensity when exercising independently;Short Term: Able to use daily as guideline for intensity in rehab Short Term: Able to state/look up THRR;Long Term: Able to use THRR to govern intensity when exercising independently;Short Term: Able to use daily as guideline for intensity in rehab   Understanding of Exercise Prescription Yes Yes Yes Yes Yes   Intervention Provide education, explanation, and written materials on patient's individual exercise prescription Provide education, explanation, and written materials on patient's individual  exercise prescription Provide education, explanation, and written materials on patient's individual exercise prescription Provide education, explanation, and written materials on patient's individual exercise prescription Provide education, explanation, and written materials on patient's individual exercise prescription   Expected Outcomes Short Term: Able to explain program exercise prescription;Long Term: Able to explain home exercise prescription to exercise independently Short Term: Able to explain program exercise prescription;Long Term: Able to explain home exercise prescription to exercise  independently Short Term: Able to explain program exercise prescription;Long Term: Able to explain home exercise prescription to exercise independently Short Term: Able to explain program exercise prescription;Long Term: Able to explain home exercise prescription to exercise independently Short Term: Able to explain program exercise prescription;Long Term: Able to explain home exercise prescription to exercise independently            Exercise Goals Re-Evaluation:  Exercise Goals Re-Evaluation     Row Name 06/19/21 1455 07/17/21 1447 08/14/21 1550 09/11/21 1535       Exercise Goal Re-Evaluation   Exercise Goals Review Increase Physical Activity;Increase Strength and Stamina;Able to understand and use rate of perceived exertion (RPE) scale;Able to understand and use Dyspnea scale;Knowledge and understanding of Target Heart Rate Range (THRR);Understanding of Exercise Prescription Increase Physical Activity;Increase Strength and Stamina;Able to understand and use rate of perceived exertion (RPE) scale;Able to understand and use Dyspnea scale;Knowledge and understanding of Target Heart Rate Range (THRR);Understanding of Exercise Prescription Increase Physical Activity;Increase Strength and Stamina;Able to understand and use rate of perceived exertion (RPE) scale;Able to understand and use Dyspnea scale;Knowledge  and understanding of Target Heart Rate Range (THRR);Understanding of Exercise Prescription Increase Physical Activity;Increase Strength and Stamina;Able to understand and use rate of perceived exertion (RPE) scale;Able to understand and use Dyspnea scale;Knowledge and understanding of Target Heart Rate Range (THRR);Understanding of Exercise Prescription    Comments Pt has attended 6 exercise sessions. She reports going to the gym already before starting the program, but her daughter reports that she does not push herself hard enough. She has been able to progress her workloads and is eager to improve her functioning. She is currently exercising at 3.22 METs. Will continue to monitor and progress as able. Pt has attended 13 exercise sessions. She has been able to progress her workloads while in the program and is motivated to improve her physical fitness. She continues to go to the gym on her days away from rehab. Her HR has decreased while on the TM, so we have been able to increase her intensities on it. She is currently exercising at 3.3 METs. Will continue to monitor and progress as able. Pt has attended 21 sessions of pulmonary rehab. She continues to progress her workload and is motivated to improve. She has a home exercise equipment and also goes to the gym on her off days from class. She is currently exercising at 3.30 METs on the treadmill. Will continue to monitor and progress as able. Pt has attended 29 sessions of pulmonary rehab. She is pushing herself during class and ibcreasing her workload when able. She is exercising at home using treadmill and also goes to the gym. She is currently exercising at 4.15 METs. Will continue to monitor and progress as able.    Expected Outcomes Through exercise at rehab and at home, the patient will meet their stated goals. Through exercise at rehab and at home, the patient will meet their stated goals. Through exercise at rehab and at home, the patient will meet their  stated goals. Through exercise at rehab and at home, the patient will meet their stated goals.             Nutrition & Weight - Outcomes:  Pre Biometrics - 05/29/21 1501       Pre Biometrics   Height 5' 1"  (1.549 m)    Weight 141 lb 8.6 oz (64.2 kg)    Waist Circumference 35 inches    Hip Circumference 39.5 inches  Waist to Hip Ratio 0.89 %    BMI (Calculated) 26.76    Triceps Skinfold 35 mm    % Body Fat 40.4 %    Grip Strength 22.7 kg    Flexibility 0 in    Single Leg Stand 25.15 seconds             Post Biometrics - 09/27/21 1401        Post  Biometrics   Height 5' 1"  (1.549 m)    Weight 136 lb 3.9 oz (61.8 kg)    Waist Circumference 35 inches    Hip Circumference 38 inches    Waist to Hip Ratio 0.92 %    BMI (Calculated) 25.76    Triceps Skinfold 25 mm    % Body Fat 38.1 %    Grip Strength 22.6 kg    Flexibility 0 in    Single Leg Stand 45 seconds             Nutrition:  Nutrition Therapy & Goals - 08/06/21 1142       Personal Nutrition Goals   Comments Patient averages weight around 62.2kg, staying consistent .   We offer 2 educational sessions on heart healthy nutrition with handouts and offer assistance with RD referral if patinet is interested.  Will evaluate the need for spanish information .      Intervention Plan   Intervention Nutrition handout(s) given to patient.    Expected Outcomes Short Term Goal: Understand basic principles of dietary content, such as calories, fat, sodium, cholesterol and nutrients.             Nutrition Discharge:  Nutrition Assessments - 10/03/21 1409       MEDFICTS Scores   Pre Score 54    Post Score 24    Score Difference -30             Education Questionnaire Score:  Knowledge Questionnaire Score - 10/03/21 1406       Knowledge Questionnaire Score   Pre Score 14/18    Post Score 17/18             Goals reviewed with patient; copy given to patient. Pt discharged from PR after  36 sessions. She was able to improve her walk test distance by 13.0%, and her MET level at graduation was 4.54. She already goes to the gym multiple times per week and reports she will continue to do so now that she has graduated from the program.

## 2021-10-10 ENCOUNTER — Ambulatory Visit (HOSPITAL_COMMUNITY)
Admission: RE | Admit: 2021-10-10 | Discharge: 2021-10-10 | Disposition: A | Discharge status: Home / Self Care | Payer: Medicare Other | Source: Ambulatory Visit | Attending: Family Medicine | Admitting: Family Medicine

## 2021-10-10 DIAGNOSIS — R7989 Other specified abnormal findings of blood chemistry: Secondary | ICD-10-CM | POA: Insufficient documentation

## 2021-10-10 DIAGNOSIS — R945 Abnormal results of liver function studies: Secondary | ICD-10-CM | POA: Diagnosis not present

## 2021-10-16 ENCOUNTER — Telehealth: Payer: Self-pay | Admitting: Family Medicine

## 2021-10-16 NOTE — Telephone Encounter (Signed)
Spoke with Robbins who said neither she or Pallavi called.

## 2021-12-04 ENCOUNTER — Ambulatory Visit (INDEPENDENT_AMBULATORY_CARE_PROVIDER_SITE_OTHER): Payer: Medicare Other | Admitting: Family Medicine

## 2021-12-04 ENCOUNTER — Encounter: Payer: Self-pay | Admitting: Family Medicine

## 2021-12-04 ENCOUNTER — Ambulatory Visit (INDEPENDENT_AMBULATORY_CARE_PROVIDER_SITE_OTHER): Payer: Medicare Other

## 2021-12-04 VITALS — BP 134/75 | HR 91 | Temp 97.8°F | Ht 61.0 in | Wt 135.4 lb

## 2021-12-04 DIAGNOSIS — T148XXA Other injury of unspecified body region, initial encounter: Secondary | ICD-10-CM

## 2021-12-04 DIAGNOSIS — S60222A Contusion of left hand, initial encounter: Secondary | ICD-10-CM | POA: Diagnosis not present

## 2021-12-04 DIAGNOSIS — M79642 Pain in left hand: Secondary | ICD-10-CM

## 2021-12-04 NOTE — Progress Notes (Signed)
Translator used for the duration of the visit: [place]:  1-2. Left hand pain/Bruising Encouraged ice, elevation, and Tylenol for pain. X-ray without any acute abnormalities. - DG Hand Complete Left   Follow up plan: Return if symptoms worsen or fail to improve.  Hendricks Limes, MSN, APRN, FNP-C [facility]  Subjective:   [name], female    DOB: Apr 26, 1950, 71 y.o.   MRN: [id number]  HPI: Tasha [name] is a 71 y.o. female presenting on 12/04/2021 for bruise on finger (Left ring finger- bruise and painful that was noticed yesterday morning )  Patient reports a bruise to her left ring finger that she noticed yesterday. It is painful and the bruise is spreading to the middle finger. Denies any injury. No home treatments. Not on blood thinners. No bruising anywhere else.   ROS: Negative unless specifically indicated above in HPI.   Relevant past medical history reviewed and updated as indicated.   Allergies and medications reviewed and updated.   Current Outpatient Medications:    acetaminophen (TYLENOL) 500 MG tablet, Take 1 tablet (500 mg total) by mouth every 6 (six) hours as needed., Disp: 30 tablet, Rfl: 0   aspirin EC 81 MG tablet, Take 1 tablet (81 mg total) by mouth daily., Disp: 90 tablet, Rfl: 3   atorvastatin (LIPITOR) 20 MG tablet, Take 1 tablet (20 mg total) by mouth daily at 6 PM., Disp: 90 tablet, Rfl: 3   budesonide (PULMICORT) 180 MCG/ACT inhaler, Inhale 2 puffs into the lungs in the morning and at bedtime., Disp: 1 each, Rfl: 5   cetirizine (ZYRTEC) 10 MG tablet, Take 1 tablet (10 mg total) by mouth daily as needed for allergies., Disp: 90 tablet, Rfl: 3   diltiazem (CARDIZEM CD) 120 MG 24 hr capsule, Take 120 mg by mouth daily., Disp: , Rfl:    hydrochlorothiazide (HYDRODIURIL) 25 MG tablet, Take 1 tablet (25 mg total) by mouth daily., Disp: 90 tablet, Rfl: 3   ibuprofen (ADVIL) 200 MG tablet, Take  400 mg by mouth every 6 (six) hours as needed for headache or moderate pain., Disp: , Rfl:    Ipratropium-Albuterol (COMBIVENT RESPIMAT) 20-100 MCG/ACT AERS respimat, Inhale 1 puff into the lungs every 6 (six) hours as needed for wheezing., Disp: 4 g, Rfl: 5   levothyroxine (SYNTHROID) 50 MCG tablet, Take 1 tablet (50 mcg total) by mouth daily before breakfast., Disp: 90 tablet, Rfl: 3   losartan (COZAAR) 50 MG tablet, Take 1 tablet (50 mg total) by mouth daily., Disp: 90 tablet, Rfl: 3   Omega 3 1000 MG CAPS, Take 1,000 mg by mouth daily. , Disp: , Rfl:    ondansetron (ZOFRAN-ODT) 8 MG disintegrating tablet, Take 1 tablet (8 mg total) by mouth every 6 (six) hours as needed for nausea or vomiting., Disp: 20 tablet, Rfl: 1   pantoprazole (PROTONIX) 40 MG tablet, Take 1 tablet (40 mg total) by mouth daily., Disp: 90 tablet, Rfl: 3   potassium chloride (KLOR-CON) 20 MEQ tablet, Take 1 tablet (20 mEq total) by mouth daily., Disp: 90 tablet, Rfl: 3  Allergies  Allergen Reactions   Hydromet [Hydrocodone Bit-Homatrop Mbr]     Pt doesn't remember   Toprol Xl [Metoprolol] Hives    Facial rash, itching, hives   Amoxicillin Rash    Did it involve swelling of the face/tongue/throat, SOB, or low BP? No Did it involve sudden or severe rash/hives, skin peeling, or any reaction on the inside  of your mouth or nose? Unknown Did you need to seek medical attention at a hospital or doctor's office? Unknown When did it last happen?      10 + years If all above answers are "NO", may proceed with cephalosporin use.     Objective:   BP 134/75   Pulse 91   Temp 97.8 F (36.6 C) (Temporal)   Ht '5\' 1"'$  (1.549 m)   Wt 135 lb 6.4 oz (61.4 kg)   SpO2 96%   BMI 25.58 kg/m    Physical Exam Vitals reviewed.  Constitutional:      General: She is not in acute distress.    Appearance: Normal appearance. She is not ill-appearing, toxic-appearing or diaphoretic.  HENT:     Head: Normocephalic and atraumatic.   Eyes:     General: No scleral icterus.       Right eye: No discharge.        Left eye: No discharge.     Conjunctiva/sclera: Conjunctivae normal.  Cardiovascular:     Rate and Rhythm: Normal rate.  Pulmonary:     Effort: Pulmonary effort is normal. No respiratory distress.  Musculoskeletal:        General: Normal range of motion.     Left hand: Bony tenderness present. No swelling, deformity or lacerations. Normal range of motion. Normal capillary refill. Normal pulse.     Cervical back: Normal range of motion.     Comments: Bruising to left ring finger and proximal part of middle finger  Skin:    General: Skin is warm and dry.     Capillary Refill: Capillary refill takes less than 2 seconds.  Neurological:     General: No focal deficit present.     Mental Status: She is alert and oriented to person, place, and time. Mental status is at baseline.  Psychiatric:        Mood and Affect: Mood normal.        Behavior: Behavior normal.        Thought Content: Thought content normal.        Judgment: Judgment normal.

## 2021-12-27 ENCOUNTER — Ambulatory Visit (INDEPENDENT_AMBULATORY_CARE_PROVIDER_SITE_OTHER): Payer: Medicare Other | Admitting: Family Medicine

## 2021-12-27 ENCOUNTER — Encounter: Payer: Self-pay | Admitting: Family Medicine

## 2021-12-27 VITALS — BP 129/67 | HR 81 | Temp 98.0°F | Ht 61.0 in | Wt 136.0 lb

## 2021-12-27 DIAGNOSIS — E039 Hypothyroidism, unspecified: Secondary | ICD-10-CM

## 2021-12-27 DIAGNOSIS — R7989 Other specified abnormal findings of blood chemistry: Secondary | ICD-10-CM

## 2021-12-27 DIAGNOSIS — I2510 Atherosclerotic heart disease of native coronary artery without angina pectoris: Secondary | ICD-10-CM | POA: Diagnosis not present

## 2021-12-27 DIAGNOSIS — E785 Hyperlipidemia, unspecified: Secondary | ICD-10-CM | POA: Diagnosis not present

## 2021-12-27 DIAGNOSIS — R7303 Prediabetes: Secondary | ICD-10-CM

## 2021-12-27 DIAGNOSIS — I10 Essential (primary) hypertension: Secondary | ICD-10-CM

## 2021-12-27 DIAGNOSIS — K219 Gastro-esophageal reflux disease without esophagitis: Secondary | ICD-10-CM | POA: Diagnosis not present

## 2021-12-27 LAB — BAYER DCA HB A1C WAIVED: HB A1C (BAYER DCA - WAIVED): 5.2 % (ref 4.8–5.6)

## 2021-12-27 MED ORDER — PANTOPRAZOLE SODIUM 40 MG PO TBEC: 40.0000 mg | DELAYED_RELEASE_TABLET | Freq: Every day | ORAL | 3 refills | Status: DC

## 2021-12-27 NOTE — Progress Notes (Signed)
BP 129/67   Pulse 81   Temp 98 F (36.7 C)   Ht 5' 1" (1.549 m)   Wt 136 lb (61.7 kg)   SpO2 98%   BMI 25.70 kg/m    Subjective:   [name], female    DOB: 1950-12-05, 71 y.o.   MRN: [id number]  HPI: Tasha [name] is a 71 y.o. female presenting on 12/27/2021 for Medical Management of Chronic Issues, Hypertension, and Hypothyroidism   HPI Prediabetes Patient comes in today for recheck of his diabetes. Patient has been currently taking diet control. Patient is currently on an ACE inhibitor/ARB. Patient has not seen an ophthalmologist this year. Patient denies any issues with their feet. The symptom started onset as an adult hypertension hyperlipidemia and hypothyroidism ARE RELATED TO DM   Hypertension Patient is currently on losartan and hydrochlorothiazide, and their blood pressure today is 129/67.Marland Kitchen  At times she says she has had occasional dizziness, she checked her blood pressure and its not been too low during this time.  She has not checked her blood sugar but it is only happened over the summertime. Patient denies headaches, blurred vision, chest pains, shortness of breath, or weakness. Denies any side effects from medication and is content with current medication.  Hyperlipidemia Patient is coming in for recheck of his hyperlipidemia. The patient is currently taking fish oil and atorvastatin. They deny any issues with myalgias or history of liver damage from it. They deny any focal numbness or weakness or chest pain.   Hypothyroidism recheck Patient is coming in for thyroid recheck today as well. They deny any issues with hair changes or heat or cold problems or diarrhea or constipation. They deny any chest pain or palpitations. They are currently on levothyroxine 50 micrograms   GERD Patient is currently on Protonix.  She denies any major symptoms or abdominal pain or belching or burping. She denies any blood in her stool or lightheadedness or dizziness.    Relevant past medical, surgical, family and social history reviewed and updated as indicated. Interim medical history since our last visit reviewed. Allergies and medications reviewed and updated.  Review of Systems  Constitutional:  Negative for chills and fever.  HENT:  Negative for congestion, ear discharge and ear pain.   Eyes:  Negative for redness and visual disturbance.  Respiratory:  Negative for chest tightness and shortness of breath.   Cardiovascular:  Negative for chest pain and leg swelling.  Genitourinary:  Negative for difficulty urinating and dysuria.  Musculoskeletal:  Negative for back pain and gait problem.  Skin:  Negative for rash.  Neurological:  Negative for light-headedness and headaches.  Psychiatric/Behavioral:  Negative for agitation and behavioral problems.   All other systems reviewed and are negative.   Per HPI unless specifically indicated above   Allergies as of 12/27/2021       Reactions   Hydromet [hydrocodone Bit-homatrop Mbr]    Pt doesn't remember   Toprol Xl [metoprolol] Hives   Facial rash, itching, hives   Amoxicillin Rash   Did it involve swelling of the face/tongue/throat, SOB, or low BP? No Did it involve sudden or severe rash/hives, skin peeling, or any reaction on the inside of your mouth or nose? Unknown Did you need to seek medical attention at a hospital or doctor's office? Unknown When did it last happen?      10 + years If all above answers are "NO", may proceed with cephalosporin use.  Medication List        Accurate as of December 27, 2021 11:45 AM. If you have any questions, ask your nurse or doctor.          acetaminophen 500 MG tablet Commonly known as: TYLENOL Take 1 tablet (500 mg total) by mouth every 6 (six) hours as needed.   aspirin EC 81 MG tablet Take 1 tablet (81 mg total) by mouth daily.   atorvastatin 20 MG tablet Commonly known as: LIPITOR Take 1 tablet (20 mg total) by mouth daily at 6  PM.   budesonide 180 MCG/ACT inhaler Commonly known as: PULMICORT Inhale 2 puffs into the lungs in the morning and at bedtime.   cetirizine 10 MG tablet Commonly known as: ZYRTEC Take 1 tablet (10 mg total) by mouth daily as needed for allergies.   Combivent Respimat 20-100 MCG/ACT Aers respimat Generic drug: Ipratropium-Albuterol Inhale 1 puff into the lungs every 6 (six) hours as needed for wheezing.   diltiazem 120 MG 24 hr capsule Commonly known as: CARDIZEM CD Take 120 mg by mouth daily.   hydrochlorothiazide 25 MG tablet Commonly known as: HYDRODIURIL Take 1 tablet (25 mg total) by mouth daily.   ibuprofen 200 MG tablet Commonly known as: ADVIL Take 400 mg by mouth every 6 (six) hours as needed for headache or moderate pain.   levothyroxine 50 MCG tablet Commonly known as: SYNTHROID Take 1 tablet (50 mcg total) by mouth daily before breakfast.   losartan 50 MG tablet Commonly known as: COZAAR Take 1 tablet (50 mg total) by mouth daily.   Omega 3 1000 MG Caps Take 1,000 mg by mouth daily.   ondansetron 8 MG disintegrating tablet Commonly known as: ZOFRAN-ODT Take 1 tablet (8 mg total) by mouth every 6 (six) hours as needed for nausea or vomiting.   pantoprazole 40 MG tablet Commonly known as: PROTONIX Take 1 tablet (40 mg total) by mouth daily.   potassium chloride SA 20 MEQ tablet Commonly known as: KLOR-CON M Take 1 tablet (20 mEq total) by mouth daily.         Objective:   BP 129/67   Pulse 81   Temp 98 F (36.7 C)   Ht 5' 1" (1.549 m)   Wt 136 lb (61.7 kg)   SpO2 98%   BMI 25.70 kg/m   Wt Readings from Last 3 Encounters:  12/27/21 136 lb (61.7 kg)  12/04/21 135 lb 6.4 oz (61.4 kg)  09/27/21 136 lb 3.9 oz (61.8 kg)    Physical Exam Vitals and nursing note reviewed.  Constitutional:      General: She is not in acute distress.    Appearance: She is well-developed. She is not diaphoretic.  Eyes:     Conjunctiva/sclera: Conjunctivae  normal.  Cardiovascular:     Rate and Rhythm: Normal rate and regular rhythm.     Heart sounds: Normal heart sounds. No murmur heard. Pulmonary:     Effort: Pulmonary effort is normal. No respiratory distress.     Breath sounds: Normal breath sounds. No wheezing.  Musculoskeletal:        General: No swelling or tenderness. Normal range of motion.  Skin:    General: Skin is warm and dry.     Findings: No rash.  Neurological:     Mental Status: She is alert and oriented to person, place, and time.     Coordination: Coordination normal.  Psychiatric:        Behavior: Behavior normal.  Assessment & Plan:   Problem List Items Addressed This Visit       Cardiovascular and Mediastinum   Hypertension - Primary     Digestive   Gastroesophageal reflux disease without esophagitis   Relevant Medications   pantoprazole (PROTONIX) 40 MG tablet     Endocrine   Hypothyroidism   Relevant Orders   TSH     Other   Hyperlipidemia with target LDL less than 100   Prediabetes   Relevant Orders   Bayer DCA Hb A1c Waived   Other Visit Diagnoses     Elevated liver function tests       Relevant Orders   CMP14+EGFR       Patient wants to wait to tetanus till Monday do a nurse visit because of party this weekend  Patient had a normal right upper quadrant ultrasound, will recheck LFTs today and recheck A1c and thyroid today as well. Follow up plan: Return in about 3 months (around 03/28/2022), or if symptoms worsen or fail to improve, for Tetanus appointment with nurse on Monday and 3 month prediabetes and hypertension and hyperlipidemia.  Counseling provided for all of the vaccine components Orders Placed This Encounter  Procedures   Bayer [place]ry Hb A1c Waived   TSH   CMP14+EGFR    Caryl Pina, MD [facility] 12/27/2021, 11:45 AM

## 2021-12-28 LAB — CMP14+EGFR
ALT: 89 [IU]/L — ABNORMAL HIGH (ref 0–32)
AST: 65 [IU]/L — ABNORMAL HIGH (ref 0–40)
Albumin/Globulin Ratio: 1.7 (ref 1.2–2.2)
Albumin: 4.1 g/dL (ref 3.8–4.8)
Alkaline Phosphatase: 70 [IU]/L (ref 44–121)
BUN/Creatinine Ratio: 19 (ref 12–28)
BUN: 16 mg/dL (ref 8–27)
Bilirubin Total: 0.3 mg/dL (ref 0.0–1.2)
CO2: 27 mmol/L (ref 20–29)
Calcium: 9.4 mg/dL (ref 8.7–10.3)
Chloride: 102 mmol/L (ref 96–106)
Creatinine, Ser: 0.84 mg/dL (ref 0.57–1.00)
Globulin, Total: 2.4 g/dL (ref 1.5–4.5)
Glucose: 90 mg/dL (ref 70–99)
Potassium: 3.9 mmol/L (ref 3.5–5.2)
Sodium: 143 mmol/L (ref 134–144)
Total Protein: 6.5 g/dL (ref 6.0–8.5)
eGFR: 74 mL/min/{1.73_m2}

## 2021-12-28 LAB — TSH: TSH: 1.57 u[IU]/mL (ref 0.450–4.500)

## 2021-12-31 ENCOUNTER — Ambulatory Visit (INDEPENDENT_AMBULATORY_CARE_PROVIDER_SITE_OTHER): Payer: Medicare Other | Admitting: *Deleted

## 2021-12-31 DIAGNOSIS — Z23 Encounter for immunization: Secondary | ICD-10-CM

## 2021-12-31 NOTE — Progress Notes (Signed)
Pt in today for Tdap. Given left deltoid. Pt tolerated well.

## 2022-01-11 ENCOUNTER — Other Ambulatory Visit: Payer: Self-pay | Admitting: *Deleted

## 2022-01-11 DIAGNOSIS — R7303 Prediabetes: Secondary | ICD-10-CM

## 2022-01-11 MED ORDER — LOSARTAN POTASSIUM 50 MG PO TABS: 50.0000 mg | ORAL_TABLET | Freq: Every day | ORAL | 1 refills | Status: DC

## 2022-01-25 ENCOUNTER — Ambulatory Visit (HOSPITAL_COMMUNITY)
Admission: RE | Admit: 2022-01-25 | Discharge: 2022-01-25 | Disposition: A | Discharge status: Home / Self Care | Payer: Medicare Other | Source: Ambulatory Visit | Attending: Family Medicine | Admitting: Family Medicine

## 2022-01-25 DIAGNOSIS — J849 Interstitial pulmonary disease, unspecified: Secondary | ICD-10-CM | POA: Diagnosis not present

## 2022-02-07 ENCOUNTER — Encounter: Payer: Self-pay | Admitting: Internal Medicine

## 2022-02-07 ENCOUNTER — Ambulatory Visit (INDEPENDENT_AMBULATORY_CARE_PROVIDER_SITE_OTHER): Payer: Medicare Other | Admitting: Internal Medicine

## 2022-02-07 VITALS — BP 146/80 | HR 84 | Temp 98.4°F | Ht 61.0 in | Wt 138.0 lb

## 2022-02-07 DIAGNOSIS — Z23 Encounter for immunization: Secondary | ICD-10-CM | POA: Diagnosis not present

## 2022-02-07 DIAGNOSIS — J849 Interstitial pulmonary disease, unspecified: Secondary | ICD-10-CM

## 2022-02-07 DIAGNOSIS — I2510 Atherosclerotic heart disease of native coronary artery without angina pectoris: Secondary | ICD-10-CM | POA: Diagnosis not present

## 2022-02-07 DIAGNOSIS — R768 Other specified abnormal immunological findings in serum: Secondary | ICD-10-CM

## 2022-02-07 DIAGNOSIS — R7989 Other specified abnormal findings of blood chemistry: Secondary | ICD-10-CM | POA: Diagnosis not present

## 2022-02-07 LAB — PULMONARY FUNCTION TEST
DL/VA % pred: 114 %
DL/VA: 4.85 ml/min/mmHg/L
DLCO cor % pred: 82 %
DLCO cor: 14.38 ml/min/mmHg
DLCO unc % pred: 82 %
DLCO unc: 14.38 ml/min/mmHg
FEF 25-75 Pre: 2.78 L/sec
FEF2575-%Pred-Pre: 164 %
FEV1-%Pred-Pre: 95 %
FEV1-Pre: 1.88 L
FEV1FVC-%Pred-Pre: 121 %
FEV6-%Pred-Pre: 82 %
FEV6-Pre: 2.03 L
FEV6FVC-%Pred-Pre: 104 %
FVC-%Pred-Pre: 78 %
FVC-Pre: 2.04 L
Pre FEV1/FVC ratio: 92 %
Pre FEV6/FVC Ratio: 100 %

## 2022-02-07 NOTE — Patient Instructions (Signed)
Spirometry/DLCO performed today. 

## 2022-02-07 NOTE — Progress Notes (Signed)
Spirometry/DLCO performed today. 

## 2022-02-07 NOTE — Patient Instructions (Addendum)
ICD-10-CM   1. Interstitial lung disease ([place])  J84.9     2. ANA positive  R76.8     3. Flu vaccine need  Z23     4. Abnormal LFTs  R79.89     5. Coronary artery calcification seen on CAT scan  I25.10        - -BAL lymphocytosis August 2019 following antifreeze exposure and ANA positivity History of environmental allergies   -ILD getting worse on CT  -Liver function found to be slightly abnormal in September 2023 - Coronary artery calcification seen on CT - you need to see cardiology  Plan - high dose flu shot 02/07/2022 -Check function test today 02/07/2022 -If this is normal we will start low-dose nintedanib 100 mg twice daily with food  -Extensively counseled through interpreter [place] - refer cardiology for coronary artery calcification  Follow-up -6 weeks  with nurse practitioner to discuss nintedanib uptake  -ILD symptom score and simple walking desaturation test at the time of follow-up  -  Interpreter needed at follow-up

## 2022-02-07 NOTE — Progress Notes (Signed)
OV 01/22/18 71 year old female, never smoked. PMH  Hypertension, metabolic syndrome, thrombocytopenia, prediabetes, interstitial lung disease, PAF. Not previously followed by [facility] pulmonary care. Admitted to [facility] from outside hospital on 08/27 with acute hypoxic respiratory failure in setting of bilateral pulmonary infiltrates. Work up indicated [place]. Dx subacute ILD vs pneumonia thought to be from exposure to antifreeze leak in patients car. Requiring intubation from 08/30-09/05, chest tube placed due to pneumothorax. [facility] from 09/10- 09/20, required min assistance with mobility and basic self-care tasks. Speech therapy worked on dysphagia, able to tolerate regular diet thin liquids without signs and symptoms of aspiration.    Patient presents today for hospital follow-up visit. Accompanied by her daughter who also provides translation. She is feeling a lot better, no breathing issues. Finished oral steroid today. BS 119. CBC improving. Eating and drinking ok. No difficulties swallowing and denies aspiration. Ambulting with walker. Denies fall, sob, wheezing, cough, fever or chest pain.    Results for ROSAMARY, BOUDREAU (MRN [id number]) as of 02/19/2018 10:01  Ref. Range 12/16/2017 19:24 12/17/2017 18:59  Sed Rate Latest Ref Range: 0 - 22 mm/hr 76 (H) 69 (H)  Results for DARCUS, EDDS (MRN [id number]) as of 02/19/2018 10:01  Ref. Range 12/17/2017 18:59  Speckled Pattern Unknown 1:320 (H)  Results for CHENEE, MUNNS (MRN [id number]) as of 02/19/2018 10:01  Ref. Range 12/19/2017 14:15  Color, Fluid Latest Ref Range: YELLOW  COLORLESS (A)  WBC, Fluid Latest Ref Range: 0 - 1,000 cu mm 93  Lymphs, Fluid Latest Units: % 78  Eos, Fluid Latest Units: % 2  Appearance, Fluid Latest Ref Range: CLEAR  CLEAR (A)  Neutrophil Count, Fluid Latest Ref Range: 0 - 25 % 7  Monocyte-Macrophage-Serous Fluid Latest Ref Range: 50 - 90 % 13 (L)     OV  02/19/2018  Subjective:  Patient ID: Talmage Nap, female , DOB: 09/14/1950 , age 66 y.o. , MRN: [id number] , ADDRESS: [address] [place] [address]   02/19/2018 -   Chief Complaint  Patient presents with   Follow-up    States she still has dry cough, states the SOB has resolved. Denies chest pain or idscomfort.      HPI Kierre Bradway 71 y.o. -accompanied by her daughter Verdis Frederickson.  End of August 2019 she suffered acute lung injury possibly Boop based on the nature of infiltrates and a high ESR and a positive ANA [BAL lymphocytosis].  There is some history of antifreeze exposure.  She has recovered from it and is now at home.  She is brought in by Dr. Verdis Frederickson.  Verdis Frederickson is doing the translation.  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  Post discharge in the hospital patient is off oxygen and is overall better.  Steroids ended 2 weeks ago but since the completion of steroids she feels the cough is coming back and getting worse.  Currently cough is moderate in intensity and without any sputum production.  2 days ago her primary care physician apparently changed 1 of her antihypertensives because of the cough.  I am unable to determine which one but I suspect it was an ACE inhibitor.  She is currently on losartan.  There is no fever or chills.  Current walking desaturation test appears adequate        ROS  OV 03/31/2018  Subjective:  Patient ID: Talmage Nap, female , DOB: [birth year] , age 26 y.o. ,  MRN: [id number] , ADDRESS: [address] [place] [address]   03/31/2018 -   Chief Complaint  Patient presents with   Follow-up    pt states breathing is doing well. c/o non prod cough.     HPI Neelie Howdeshell 71 y.o. -presents for interstitial lung disease acute with lymphocytic bronchoalveolar lavage after antifreeze exposure  After last visit she showed improvement.  We repeated ANA and this was negative.  We also did some extra  antibody such as anti-Jo 1 and this was negative.  Her ESR has normalized.  In the interim she continues to improve.  Shortness of breath is almost nonexistent.  Cough is improved but she still has mild residual cough.  She had high-resolution CT scan of the chest #2019 that I personally visualized and there is still some residual groundglass opacities along with some evolving fibrotic pattern.  It seems more predominant in the lung base.  Her her son Shellee Milo is with her.  History is gained from talking to interpreter on the video camera.  His name is Leanna Sato.  She denies any diabetes or hyperlipidemia or stroke in the past.  She does not have any mood swings.  In the past she has tolerated prednisone without problem.  Denies any bony issues.   OV 06/11/2018  Subjective:  Patient ID: Talmage Nap, female , DOB: May 20, 1950 , age 36 y.o. , MRN: [id number] , ADDRESS: [address] [place] [address]  Christinamarie Tall 71 y.o. -presents for interstitial lung disease acute with lymphocytic bronchoalveolar lavage after antifreeze exposure 06/11/2018 -   Chief Complaint  Patient presents with   Follow-up    PFT performed today.  Pt states she has been doing well since last visit and denies any complaints.      HPI Tashima Capasso 71 y.o. -follow-up.  At last visit in December 2019 I placed her on a 10-week prednisone course.  In between she came and saw a nurse practitioner approximately just over a month ago.  She was tolerating the prednisone fine.  It is now 4 weeks and she finished the prednisone.  According to the daughter who acted as the interpreter the prednisone really did help with her symptoms.  It improved her shortness of breath.  Currently she is nearly asymptomatic.  Her current symptoms are documented below.  She is feeling good.  She had pulmonary function test today with spirometry being normal and DLCO being near normal.  She is noted to be on ACE inhibitor but she denies any cough.  Last CT  scan of the chest was November 2019    OV 12/07/2019   Subjective:  Patient ID: Talmage Nap, female , DOB: 1950-05-09, age 15 y.o. years. , MRN: [id number],  ADDRESS: [place] [address] PCP  Dettinger, Fransisca Kaufmann, MD Providers : Treatment Team:  Attending Provider: Brand Males, MD presents for interstitial lung disease acute with lymphocytic bronchoalveolar lavage after antifreeze exposure in aug 2019  Chief Complaint  Patient presents with   Follow-up    Last seen 06/11/2018. Pt states she has been about the same since last visit. Pt is still having complaints of cough and is coughing up occ white phlegm. Pt also has increased SOB when she is in enclosed areas that makes her feel like she cannot breathe.       HPI Kaylene Clarin 71 y.o. -presents with her daughter-in-law is for a face-to-face visit.  The interpreter is Mickel Baas.  Mickel Baas is from [facility] health.  It appears that overall since her last visit in February 2020 patient is stable.  In August 2020 she had a CT scan of the chest that shows presence of mild ongoing ILD.  She tells [place] that she is had the Covid vaccine.  She has been exercising at the [facility].  She does not use oxygen.  She has mild shortness of breath with exertion when she bends down and some mild cough but overall compared to 2019 she is slowly progressively better.  She does have some dry cough when she is sitting as well.  She needs to drink water to improve that.  There are no other new medical issues.  Her walking desaturation test is unchanged compared to February 2020.   however when she did the interstitial lung disease symptom questionnaire symptoms are significantly worse than before.  She really cannot pinpoint if she is definitely worse or not.  She did admit that she has significant environmental allergies to pollen dust and perfumes.  This is a new history.   CT chest high resolution Aug 2020 COMPARISON:  03/11/2018, 12/17/2017    FINDINGS: Cardiovascular: Aortic atherosclerosis. Normal heart size. Scattered coronary artery calcifications. No pericardial effusion.   Mediastinum/Nodes: No enlarged mediastinal, hilar, or axillary lymph nodes. Thyroid gland, trachea, and esophagus demonstrate no significant findings.   Lungs/Pleura: There is a mild pattern of pulmonary fibrosis with an apical to basal gradient featuring mild tubular bronchiectasis, irregular peripheral interstitial opacity with some superimposed ground-glass, and some evidence of bronchiolectasis in the lung bases. There is no overt honeycombing. No significant air trapping on expiratory phase imaging. There is a densely calcified benign pulmonary nodule of the left lung base. No pleural effusion or pneumothorax.  IMPRESSION: 1. There is a mild pattern of pulmonary fibrosis with an apical to basal gradient featuring mild tubular bronchiectasis, irregular peripheral interstitial opacity with some superimposed ground-glass, and some evidence of bronchiolectasis in the lung bases. There is no overt honeycombing. No significant air trapping on expiratory phase imaging. Findings are not significantly changed compared to prior examination and consistent with an "indeterminate for UIP" pattern fibrosis by ATS pulmonary fibrosis criteria.   2.  Aortic atherosclerosis and coronary artery disease.     Electronically Signed   By: Eddie Candle M.D.   On: 12/10/2018 12:13  Results for PHOENYX, MELKA (MRN [id number]) as of 06/11/2018 12:13  Ref. Range 06/11/2018 10:31  FVC-Pre Latest Units: L 2.05  FVC-%Pred-Pre Latest Units: % 74  FEV1-Pre Latest Units: L 1.88  FEV1-%Pred-Pre Latest Units: % 90  Pre FEV1/FVC ratio Latest Units: % 92   Results for JERZY, CROTTEAU (MRN [id number]) as of 06/11/2018 12:13  Ref. Range 06/11/2018 10:31  DLCO unc Latest Units: ml/min/mmHg 13.22  DLCO unc % pred Latest Units: % 74  Results for LATEESHA, BEZOLD (MRN  [id number]) as of 03/31/2018 16:37  Ref. Range 12/16/2017 19:24 12/17/2017 18:59 02/19/2018 10:53  Sed Rate Latest Ref Range: 0 - 30 mm/hr 76 (H) 69 (H) 18  Results for ROSSELYN, MARTHA (MRN [id number]) as of 03/31/2018 16:37  Ref. Range 02/19/2018 10:53 02/19/2018 11:03  Anti Nuclear Antibody(ANA) Latest Ref Range: Negative   Negative  Anti JO-1 Latest Ref Range: 0.0 - 0.9 AI  <0.2  ds DNA Ab Latest Units: IU/mL <1   Results for AVORY, RAHIMI (MRN [id number]) as of 01/25/2020 16:46  Ref. Range 11/25/2018 09:57 01/25/2019 09:31  Anti Nuclear Antibody (ANA) Latest Ref Range: NEGATIVE   POSITIVE (A)  ANA Pattern 1 Unknown  Nuclear, Nucleolar (A)  ANA Titer 1 Latest Units: titer Positive (A) 2:620 (H)  Cyclic Citrullin Peptide Ab Latest Units: UNITS 3 <16  ds DNA Ab Latest Units: IU/mL  <1  RA Latex Turbid. Latest Ref Range: <14 IU/mL 10.9 <14  ENA SM Ab Ser-aCnc Latest Ref Range: <1.0 NEG AI  <1.0 NEG  Homogeneous Pattern Unknown 1:160 (H)   Speckled Pattern Unknown 1:320 (H)       OV 01/25/2020   Subjective:  Patient ID: Talmage Nap, female , DOB: 11/01/1950, age 78 y.o. years. , MRN: [id number],  ADDRESS: [place] [address] PCP  Dettinger, Fransisca Kaufmann, MD Providers : Treatment Team:  Attending Provider: Brand Males, MD   Chief Complaint  Patient presents with   Follow-up    cough decreased     August 2019 she suffered acute lung injury possibly Boop based on the nature of infiltrates and a high ESR and a positive ANA [BAL lymphocytosis].  There is some history of antifreeze exposure. -> ILD  Hx of env allergies - blood allergy test Aug 2021 - igE normal / but positive for Grass  Intermittent ANA positive - last +ve late 2020   HPI Syliva Wirsing 71 y.o. -presents with her daughter in law Early Chars and also the interpreter Vicente Males.  This visit has been scheduled because last visit she had increased symptoms.  Therefore we did a high-resolution CT chest Dr.  Weber Cooks the radiologist thinks the slight progression.  However we also repeated pulmonary function test improvement/stabilization.  In terms of symptoms patient tells me her symptoms are actually improved.  ILD symptom score actually shows improvement.  She attributes this to exercise.  We did walking desaturation test and it is stable.  Glucotrol documented below.  He understands that she has pulmonary fibrosis.  In fact she says that she looked at [employer] and got extremely worried about pulmonary fibrosis.  At this point in time she denies any symptoms of connective tissue disease.  Overall she is stable.  She continues to have cough.  We did blood allergy work-up and is positive for grass mold IgE is normal.  She was supposed to exam nitric oxide test but this has not been done.  She has had a Covid vaccine and flu shot.  She had to have a booster counseled her about that.   No results found for: NITRICOXIDE     IMPRESSION: 1. The appearance of the lungs is compatible with interstitial lung disease, with mild progression of changes compared to the prior study, with a spectrum of findings considered probable usual interstitial pneumonia (UIP) per current ATS guidelines. 2. Aortic atherosclerosis, in addition to left main and 2 vessel coronary artery disease. Please note that although the presence of coronary artery calcium documents the presence of coronary artery disease, the severity of this disease and any potential stenosis cannot be assessed on this non-gated CT examination. Assessment for potential risk factor modification, dietary therapy or pharmacologic therapy may be warranted, if clinically indicated.   Aortic Atherosclerosis (ICD10-I70.0).     Electronically Signed   By: Vinnie Langton M.D.   On: 12/31/2019 08:22 OV 10/26/2020  Subjective:  Patient ID: Talmage Nap, female , DOB: [birth year] , age 8 y.o. , MRN: [id number] , ADDRESS: [address] [place]  [address] PCP Dettinger, Fransisca Kaufmann, MD Patient Care Team: Dettinger, Fransisca Kaufmann, MD as PCP - General (Family Medicine) Herminio Commons, MD (Inactive) as PCP - Cardiology (Cardiology) Manus Rudd  Jerilynn Mages, MD as Consulting Physician (Gastroenterology)  This Provider for this visit: Treatment Team:  Attending Provider: Brand Males, MD    10/26/2020 -   Chief Complaint  Patient presents with   Follow-up    Pt states she is feeling better since last visit. States that she does have some SOB but after using her inhaler twice a day she feels fine.    August 2019 she suffered acute lung injury possibly Boop based on the nature of infiltrates and a high ESR and a positive ANA [BAL lymphocytosis].  There is some history of antifreeze exposure. -> ILD  Hx of env allergies - blood allergy test Aug 2021 - igE normal / but positive for Grass  Intermittent ANA positive - last +ve late 2020   HPI Ama Rocchi 71 y.o. -returns for follow-up with her daughter-in-law Early Chars.  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.  She feels the Pulmicort is helping her.  In fact symptoms improved after Pulmicort.  There are no interim health issues.  Symptom score is stable.  Walking desaturation test is stable.  Previously we checked ANA and is again positive the entire ANA profile as listed.  On exam she still has bilateral bibasal crackles      OV 04/27/2021  Subjective:  Patient ID: Talmage Nap, female , DOB: [birth year] , age 58 y.o. , MRN: [id number] , ADDRESS: [address] [place] [address] PCP Dettinger, Fransisca Kaufmann, MD Patient Care Team: Dettinger, Fransisca Kaufmann, MD as PCP - General (Family Medicine) Donato Heinz, MD as PCP - Cardiology (Cardiology) Gala Romney Cristopher Estimable, MD as Consulting Physician (Gastroenterology)  This Provider for this visit: Treatment Team:  Attending Provider: Brand Males, MD    04/27/2021 -   Chief Complaint  Patient presents with   Follow-up     PFT performed today.  Pt states she is about the same since last visit.    August 2019 she suffered acute lung injury possibly Boop based on the nature of infiltrates and a high ESR and a positive ANA [BAL lymphocytosis].  There is some history of antifreeze exposure. -> ILD  Hx of env allergies - blood allergy test Aug 2021 - igE normal / but positive for Grass  Intermittent ANA positive - last +ve late 2020  HPI Yeng Ciulla 71 y.o. -returns for follow-up.  Her daughter acts as the interpreter.  Overall she feels stable.  This can be evidence in the ILD symptom score.  Also walking desaturation test is stable.  She is interested in pulmonary rehabilitation to improve her shortness of breath that is still there somewhat.  Her pulmonary function test shows and suggest decline in 3 years.  She is not feeling this.  Last CT scan of the chest was in September 2021.  Explained the need for repeat CT scan and pulmonary function test at a closer than 47-monthinterval and she is willing.  Explained that if there is evidence of progression we would consider antifibrotic's and she is willing.  Meanwhile she will get referred to pulmonary rehabilitation.    .      OV 07/26/2021  Subjective:  Patient ID: DTalmage Nap female , DOB: [birth year], age 71y.o. , MRN: [id number], [place] [address] [address] Dettinger, JFransisca Kaufmann MD Patient Care Team: Dettinger, JFransisca Kaufmann MD as PCP - General (Family Medicine) SDonato Heinz MD as PCP - Cardiology (Cardiology) RGala RomneyRCristopher Estimable MD as Consulting Physician (Gastroenterology)  This  Provider for this visit: Treatment Team:  Attending Provider: Brand Males, MD   07/26/2021 -   Chief Complaint  Patient presents with   Follow-up    PFT performed today.  Pt states she has been doing okay since last visit and denies any complaints.     HPI Kassandra Kren 71 y.o. -ILD undifferentiated phenotype.  Presents for  follow-up.  Last visit was in January 2023.  There was some concern for progression so ordered high-resolution CT chest and pulmonary function testing.  For unclear reason she missed a high-resolution CT chest.  She does not remember she had an appointment.  CMA tells me that she missed appointment.  She did have pulmonary function testing.  It shows stability compared to January 2023 but possible decline compared to 2 years ago or 3 years ago.  Symptom wise she is going to pulmonary rehabilitation she says she is feeling better but the symptom score below is inaccurate.  This time it was filled by interpreter.  Previously has been filled by family.  So do not know which of the numbers are accurate.  Did indicate to her that there is recent stability in her fibrosis.  Did indicate to her that the goal is continued stability.  Did indicate to her maybe in the last few years she is slightly worse.  Did indicate to her that if there is progressive phenotype then she would need antifibrotic's.  Did indicate to her that given current stability and feeling better with rehabilitatin and without evidence of CT scan we will hold off on any antifibrotic initiation for today    OV 02/07/2022  Subjective:  Patient ID: Talmage Nap, female , DOB: 01/15/1951 , age 56 y.o. , MRN: [id number] , ADDRESS: [address] [place] [address] PCP Dettinger, Fransisca Kaufmann, MD Patient Care Team: Dettinger, Fransisca Kaufmann, MD as PCP - General (Family Medicine) Donato Heinz, MD as PCP - Cardiology (Cardiology) Gala Romney Cristopher Estimable, MD as Consulting Physician (Gastroenterology) Brand Males, MD as Consulting Physician (Pulmonary Disease)  This Provider for this visit: Treatment Team:  Attending Provider: Brand Males, MD    02/07/2022 -   Chief Complaint  Patient presents with   Follow-up    Review PFT and CT.  No sx noted today.  Would like flu vaccine today    August 2019 she suffered acute lung injury  possibly Boop based on the nature of infiltrates and a high ESR and a positive ANA [BAL lymphocytosis].  There is some history of antifreeze exposure. -> ILD  Hx of env allergies - blood allergy test Aug 2021 - igE normal / but positive for Grass  Intermittent ANA positive - last +ve late 2020  HPI Kaelen Lortz 71 y.o. -returns for follow-up.  Presents with her daughter-in-law Andria Frames.  Interpreter is Harriett Sine from [employer].  She tells me that she is stable.  [facility] rehabilitation really worked well.  Symptom score shows stability she had pulmonary function test is also shows stability.  However she had high-resolution CT chest and I personally visualized this and compared it and showed it to her and her family.  I agree with the radiologist that significant worsening.  Therefore she has progressive phenotype.  The pattern is looking like probable UIP working towards definite of UIP which would be IPF but everything started after inhalation lung injury.  The scan shows coronary artery calcification.  No prior stress test.   She denies any GI issues of bleeding diathesis.  CT scan   SYMPTOM SCALE - ILD 06/11/2018  12/07/2019  01/25/2020 Last Weight  Most recent update: 01/25/2020  4:14 PM    Weight  65.1 kg (143 lb 9.6 oz)             7/7/2022145# 04/27/2021  07/26/2021 Pulmonary rehabilitation at [facility] since February 2023  -filled by interpreter Verdis Frederickson 02/07/2022   O2 use _0  ra ra  Shortness of Breath 0 -> 5 scale with 5 being worst (score 6 If unable to do)      0  At rest 0 0 0 0 0 2 0  Simple tasks - showers, clothes change, eating, shaving 0 3 0 0 0 2 0  Household (dishes, doing bed, laundry) 0 3 3 03 4 3 02  Shopping _1 03 0 1 0  Walking level at own pace 0 4 0 0 0 1 0  Walking stairs 0 _2 Total (40 - 48) Dyspnea Score _3 How bad is your cough? 0 _4 How bad is your fatigue _5 0  nausea  0 0 0 0  0 0  vomit  0 0 0 0 0 0  diarrhea  0 0 0 0 0 0  anzity  _6 0 00 0   Dep[resssio  meds 0 0 0 0 0  0    Simple office walk 185 feet x  3 laps goal with forehead probe 02/19/2018  03/31/2018  06/11/2018  12/07/2019  01/25/2020  10/26/2020  04/27/2021   O2 used rooom air Room air Room air ra ra ra ra  Number laps completed 3 3 x 250 feet 3 x 236 feet 3 laops _7 Comments about pace Moderate pace Mod pace Normal pace avg pace  Avg pace avg  Resting Pulse Ox/HR 99% and 71/min 100% and 89/min 100% and 83/mn 100% and 83/min 99% and 99/min 99% and 83 98% and 83  Final Pulse Ox/HR 95% and 92/min 98% and 111 98% and 101/min 98% and 101/min 98% and 107/min 97% and 99 97% and 101  Desaturated </= 88% _8  non no  Desaturated <= 3% points yes no no no none no no  Got Tachycardic >/= 90/min _9  yes yes  Symptoms at end of test none none none Mild dyspnea none noe Mild dyspnea  Miscellaneous comments x x x       No results found   CT Chest data  No results found.    PFT     Latest Ref Rng & Units 02/07/2022    1:39 PM 07/26/2021    9:08 AM 04/27/2021   10:21 AM 12/10/2019   12:24 PM 06/11/2018   10:31 AM  PFT Results  FVC-Pre L 2.04  P 2.05  1.98  2.13  2.05   FVC-Predicted Pre % 78  P 78  75  79  74   FVC-Post L    2.18    FVC-Predicted Post %    81    Pre FEV1/FVC % % 92  P 91  89  91  92   Post FEV1/FCV % %    92    FEV1-Pre L 1.88  P 1.86  1.77  1.94  1.88   FEV1-Predicted Pre % 95  P 94  88  96  90   FEV1-Post L    2.01    DLCO uncorrected ml/min/mmHg 14.38  P 11.06  10.43   13.22   DLCO UNC% % 82  P 63  59   74   DLCO corrected ml/min/mmHg 14.38  P 11.06  10.28     DLCO COR %Predicted % 82  P 63  58     DLVA Predicted % 114  P 83  80   95     P Preliminary result    HRCT Oct 2023  Narrative & Impression  CLINICAL DATA:  71 year old female history of interstitial lung disease. Follow-up study.   EXAM: CT CHEST WITHOUT CONTRAST    TECHNIQUE: Multidetector CT imaging of the chest was performed following the standard protocol without intravenous contrast. High resolution imaging of the lungs, as well as inspiratory and expiratory imaging, was performed.   RADIATION DOSE REDUCTION: This exam was performed according to the departmental dose-optimization program which includes automated exposure control, adjustment of the mA and/or kV according to patient size and/or use of iterative reconstruction technique.   COMPARISON:  High-resolution chest CT 12/30/2019. Cardiac CT 04/05/2021.   FINDINGS: Cardiovascular: Heart size is normal. There is no significant pericardial fluid, thickening or pericardial calcification. There is aortic atherosclerosis, as well as atherosclerosis of the great vessels of the mediastinum and the coronary arteries, including calcified atherosclerotic plaque in the left main, left anterior descending, left circumflex and right coronary arteries.   Mediastinum/Nodes: No pathologically enlarged mediastinal or hilar lymph nodes. Please note that accurate exclusion of hilar adenopathy is limited on noncontrast CT scans. Esophagus is unremarkable in appearance. No axillary lymphadenopathy.   Lungs/Pleura: High-resolution images demonstrate widespread but patchy areas of ground-glass attenuation, septal thickening, subpleural reticulation, traction bronchiectasis, peripheral bronchiolectasis and regional areas of architectural distortion. No frank honeycombing confidently identified at this time, however, findings are clearly progressive compared to the prior study from 12/30/2019. Inspiratory and expiratory imaging is unremarkable. Calcified granuloma in the left lower lobe. No other definite suspicious appearing pulmonary nodules or masses are noted. No acute consolidative airspace disease. No pleural effusions.   Upper Abdomen: Aortic atherosclerosis.  Colonic diverticulosis.    Musculoskeletal: There are no aggressive appearing lytic or blastic lesions noted in the visualized portions of the skeleton.   IMPRESSION: 1. Progressive interstitial lung disease with imaging findings once again categorized as probable usual interstitial pneumonia (UIP), as above. 2. Aortic atherosclerosis, in addition to left main and three-vessel coronary artery disease. Assessment for potential risk factor modification, dietary therapy or pharmacologic therapy may be warranted, if clinically indicated. 3. Colonic diverticulosis.   Aortic Atherosclerosis (ICD10-I70.0).     Electronically Signed   By: Vinnie Langton M.D.   On: 01/26/2022 12:50     has a past medical history of Acute respiratory failure ([place]), CAP (community acquired pneumonia), Hyperlipidemia, Hypertension, and Metabolic syndrome.   reports that she has quit smoking. She has never used smokeless tobacco.  Past Surgical History:  Procedure Laterality Date   CESAREAN SECTION     COLONOSCOPY N/A 06/07/2019   Procedure: COLONOSCOPY;  Surgeon: Danie Binder, MD;  [facility];  Service: Endoscopy;  Laterality: N/A;  12:45   POLYPECTOMY  06/07/2019   Procedure: POLYPECTOMY;  Surgeon: Danie Binder, MD;  [facility];  Service: Endoscopy;;    Allergies  Allergen Reactions   Hydromet [Hydrocodone Bit-Homatrop Mbr]     Pt doesn't remember   Toprol  Xl [Metoprolol] Hives    Facial rash, itching, hives   Amoxicillin Rash    Did it involve swelling of the face/tongue/throat, SOB, or low BP? No Did it involve sudden or severe rash/hives, skin peeling, or any reaction on the inside of your mouth or nose? Unknown Did you need to seek medical attention at a hospital or doctor's office? Unknown When did it last happen?      10 + years If all above answers are "NO", may proceed with cephalosporin use.     Immunization History  Administered Date(s) Administered   Fluad Quad(high Dose 65+)  03/01/2019, 01/14/2020, 01/16/2021   Influenza, High Dose Seasonal PF 03/11/2017, 03/09/2018   Influenza,inj,Quad PF,6+ Mos 02/04/2014, 02/09/2015, 02/20/2016   Moderna Sars-Covid-2 Vaccination 05/21/2019, 06/18/2019   Pneumococcal Conjugate-13 08/10/2015   Pneumococcal Polysaccharide-23 06/17/2017, 12/21/2017   Tdap 12/31/2021   Zoster Recombinat (Shingrix) 09/21/2020, 06/21/2021    Family History  Problem Relation Age of Onset   Cancer Father    Healthy Brother    Healthy Daughter    Healthy Sister    Memory loss Sister    Healthy Son    Healthy Son    Breast cancer Niece      Current Outpatient Medications:    acetaminophen (TYLENOL) 500 MG tablet, Take 1 tablet (500 mg total) by mouth every 6 (six) hours as needed., Disp: 30 tablet, Rfl: 0   aspirin EC 81 MG tablet, Take 1 tablet (81 mg total) by mouth daily., Disp: 90 tablet, Rfl: 3   atorvastatin (LIPITOR) 20 MG tablet, Take 1 tablet (20 mg total) by mouth daily at 6 PM., Disp: 90 tablet, Rfl: 3   budesonide (PULMICORT) 180 MCG/ACT inhaler, Inhale 2 puffs into the lungs in the morning and at bedtime., Disp: 1 each, Rfl: 5   cetirizine (ZYRTEC) 10 MG tablet, Take 1 tablet (10 mg total) by mouth daily as needed for allergies., Disp: 90 tablet, Rfl: 3   diltiazem (CARDIZEM CD) 120 MG 24 hr capsule, Take 120 mg by mouth daily., Disp: , Rfl:    hydrochlorothiazide (HYDRODIURIL) 25 MG tablet, Take 1 tablet (25 mg total) by mouth daily., Disp: 90 tablet, Rfl: 3   ibuprofen (ADVIL) 200 MG tablet, Take 400 mg by mouth every 6 (six) hours as needed for headache or moderate pain., Disp: , Rfl:    Ipratropium-Albuterol (COMBIVENT RESPIMAT) 20-100 MCG/ACT AERS respimat, Inhale 1 puff into the lungs every 6 (six) hours as needed for wheezing., Disp: 4 g, Rfl: 5   levothyroxine (SYNTHROID) 50 MCG tablet, Take 1 tablet (50 mcg total) by mouth daily before breakfast., Disp: 90 tablet, Rfl: 3   losartan (COZAAR) 50 MG tablet, Take 1 tablet (50  mg total) by mouth daily., Disp: 90 tablet, Rfl: 1   Omega 3 1000 MG CAPS, Take 1,000 mg by mouth daily. , Disp: , Rfl:    ondansetron (ZOFRAN-ODT) 8 MG disintegrating tablet, Take 1 tablet (8 mg total) by mouth every 6 (six) hours as needed for nausea or vomiting., Disp: 20 tablet, Rfl: 1   pantoprazole (PROTONIX) 40 MG tablet, Take 1 tablet (40 mg total) by mouth daily., Disp: 90 tablet, Rfl: 3   potassium chloride (KLOR-CON) 20 MEQ tablet, Take 1 tablet (20 mEq total) by mouth daily., Disp: 90 tablet, Rfl: 3      Objective:   Vitals:   02/07/22 1432  BP: (!) 146/80  Pulse: 84  Temp: 98.4 F (36.9 C)  TempSrc: Oral  SpO2: 99%  Weight: 138 lb (62.6 kg)  Height: _0  (1.549 m)    Estimated body mass index is 26.07 kg/m as calculated from the following:   Height as of this encounter: _1  (1.549 m).   Weight as of this encounter: 138 lb (62.6 kg).  _2 @  Filed Weights   02/07/22 1432  Weight: 138 lb (62.6 kg)   General: No distress. Looks well Neuro: Alert and Oriented x 3. GCS 15. Speech normal Psych: Pleasant Resp:  Barrel Chest - no.  Wheeze - no, Crackles - YES, No overt respiratory distress CVS: Normal heart sounds. Murmurs - no Ext: Stigmata of Connective Tissue Disease - no HEENT: Normal upper airway. PEERL +. No post nasal drip        Assessment:       ICD-10-CM   1. Interstitial lung disease ([place])  J84.9     2. ANA positive  R76.8     3. Flu vaccine need  Z23     4. Abnormal LFTs  R79.89     5. Coronary artery calcification seen on CAT scan  I25.10      She has progressive ILD.  Almost behaving now like IPF but nevertheless its progressive phenotype.  Nintedanib is the best established agent for this.  She has no bleeding diathesis or GI issues.  She does have coronary artery calcification but no prior history of heart disease.  Given her short stature we will start with low-dose nintedanib.  Extensively counseled about nintedanib  including diarrhea and liver function test monitoring.  Also counseled her about progressive phenotype and short of the CT scan.  Therefore antifibrotic is indicated.  We took a shared decision making to start it.     We will check a liver function today.  She did have some abnormal transaminitis in September 2023 when if this is significantly abnormal even now then we would not be able to start pirfenidone  For a coronary artery calcification we will get her to cardiology.    Plan:     Patient Instructions     ICD-10-CM   1. Interstitial lung disease ([place])  J84.9     2. ANA positive  R76.8     3. Flu vaccine need  Z23     4. Abnormal LFTs  R79.89     5. Coronary artery calcification seen on CAT scan  I25.10        - -BAL lymphocytosis August 2019 following antifreeze exposure and ANA positivity History of environmental allergies   -ILD getting worse on CT  -Liver function found to be slightly abnormal in September 2023 - Coronary artery calcification seen on CT - you need to see cardiology  Plan - high dose flu shot 02/07/2022 -Check function test today 02/07/2022 -If this is normal we will start low-dose nintedanib 100 mg twice daily with food  -Extensively counseled through interpreter [place] - refer cardiology for coronary artery calcification  Follow-up -6 weeks  with nurse practitioner to discuss nintedanib uptake  -ILD symptom score and simple walking desaturation test at the time of follow-up  -  Interpreter needed at follow-up   High complex medical condition requiring high risk prescription with intensive therapeutic monitoring requirement   SIGNATURE    Dr. Brand Males, M.D., F.C.C.P,  Pulmonary and Critical Care Medicine Staff Physician, [place] Director - Interstitial Lung Disease  Program  P[facility] [place] at [place], [place], [address]  Pager: [phone]  [phone], If no  answer or between  15:00h - 7:00h: call [phone] Telephone: [phone]  3:08 PM 02/07/2022

## 2022-02-08 ENCOUNTER — Other Ambulatory Visit (HOSPITAL_COMMUNITY): Payer: Self-pay

## 2022-02-08 ENCOUNTER — Telehealth: Payer: Self-pay

## 2022-02-08 LAB — HEPATIC FUNCTION PANEL
ALT: 91 U/L — ABNORMAL HIGH (ref 0–35)
AST: 68 U/L — ABNORMAL HIGH (ref 0–37)
Albumin: 4.5 g/dL (ref 3.5–5.2)
Alkaline Phosphatase: 61 U/L (ref 39–117)
Bilirubin, Direct: 0.1 mg/dL (ref 0.0–0.3)
Total Bilirubin: 0.4 mg/dL (ref 0.2–1.2)
Total Protein: 7.4 g/dL (ref 6.0–8.3)

## 2022-02-08 NOTE — Telephone Encounter (Signed)
New start for Ofev '100mg'$  capsules required PA.  PA submitted in CMM through [facility].  Awaiting determination.   Key: [id number]   PAP paperwork has been started pending insurance determination.

## 2022-02-11 NOTE — Telephone Encounter (Signed)
Received a fax regarding Prior Authorization from [facility] for [place]. Authorization has been DENIED because This drug used for INTERSTITIAL PULMONARY DISEASE UNSPECIFIED is not an approved use. Medicare Part D rules states the drug must be used for a "medically-accepted indication".  Correct diagnosis of ILD with progressive fibrotic phenotype was not entered as dx code. Will have to now appeal  Knox Saliva, PharmD, MPH, BCPS, CPP Clinical Pharmacist (Rheumatology and Pulmonology)

## 2022-02-14 NOTE — Telephone Encounter (Signed)
Received income documents and patient portion of [employer] application from patient. Still working on appeal. Will place this patient asistance paperwork in PAP pending info folder with provider portion  Knox Saliva, PharmD, MPH, BCPS, CPP Clinical Pharmacist (Rheumatology and Pulmonology)

## 2022-02-15 ENCOUNTER — Other Ambulatory Visit (HOSPITAL_COMMUNITY): Payer: Self-pay

## 2022-02-15 NOTE — Telephone Encounter (Addendum)
Returned call to [facility] regarding diagnosis code/ Phone line does not reach to any rep due to fully automated system. I've refaxed the appeal with the ICD code on cover page since I was unable to reach a rep directly  ICD Code: J84.170 Phone: [phone]  Knox Saliva, PharmD, MPH, BCPS, CPP Clinical Pharmacist (Rheumatology and Pulmonology)

## 2022-02-15 NOTE — Telephone Encounter (Signed)
Deyanira from [facility] needs diagnosis code for Ofev medication. Deyanira phone number is [phone]

## 2022-02-15 NOTE — Telephone Encounter (Signed)
Submitted an URGENT appeal to [facility] for [place].  Reference # [id number] Fax: [phone] Phone: [phone]  Knox Saliva, PharmD, MPH, BCPS, CPP Clinical Pharmacist (Rheumatology and Pulmonology)

## 2022-02-18 ENCOUNTER — Other Ambulatory Visit (HOSPITAL_COMMUNITY): Payer: Self-pay

## 2022-02-18 NOTE — Telephone Encounter (Signed)
Received fax from [facility] stating that initial denial has been overturned and OFEV is APPROVED from 02/08/2022 until further notice.  Auth# [id number]  Per test claim, pt's copay for a 30-day supply is $10.35.  Patient can fill through [place]: [phone]   PAP application will be sent to scan center for retention.

## 2022-02-18 NOTE — Telephone Encounter (Signed)
[facility] cannot fill Ofev. Will plan to send Ofev prescription to [facility]  Knox Saliva, PharmD, MPH, BCPS, CPP Clinical Pharmacist (Rheumatology and Pulmonology)

## 2022-02-20 ENCOUNTER — Telehealth: Payer: Self-pay

## 2022-02-20 DIAGNOSIS — Z7189 Other specified counseling: Secondary | ICD-10-CM

## 2022-02-20 DIAGNOSIS — Z5181 Encounter for therapeutic drug level monitoring: Secondary | ICD-10-CM

## 2022-02-20 DIAGNOSIS — J849 Interstitial pulmonary disease, unspecified: Secondary | ICD-10-CM

## 2022-02-20 NOTE — Telephone Encounter (Signed)
ATC the patient for initial counseling of Ofev. LVM for both the patient and her daughter via an interpreter.   Maryan Puls, PharmD PGY-1 [facility]: [facility] Resident

## 2022-02-22 ENCOUNTER — Other Ambulatory Visit: Payer: Self-pay | Admitting: Family Medicine

## 2022-03-05 ENCOUNTER — Encounter: Payer: Self-pay | Admitting: Internal Medicine

## 2022-03-05 ENCOUNTER — Ambulatory Visit: Payer: Medicare Other | Admitting: Cardiology

## 2022-03-05 ENCOUNTER — Ambulatory Visit: Payer: Medicare Other | Attending: Cardiology | Admitting: Internal Medicine

## 2022-03-05 VITALS — BP 162/90 | HR 79 | Ht 61.0 in | Wt 134.0 lb

## 2022-03-05 DIAGNOSIS — R0789 Other chest pain: Secondary | ICD-10-CM | POA: Insufficient documentation

## 2022-03-05 DIAGNOSIS — I480 Paroxysmal atrial fibrillation: Secondary | ICD-10-CM | POA: Diagnosis not present

## 2022-03-05 DIAGNOSIS — I2584 Coronary atherosclerosis due to calcified coronary lesion: Secondary | ICD-10-CM | POA: Diagnosis not present

## 2022-03-05 DIAGNOSIS — I2510 Atherosclerotic heart disease of native coronary artery without angina pectoris: Secondary | ICD-10-CM | POA: Insufficient documentation

## 2022-03-05 DIAGNOSIS — I4891 Unspecified atrial fibrillation: Secondary | ICD-10-CM | POA: Insufficient documentation

## 2022-03-05 NOTE — Addendum Note (Signed)
Addended by: Barbarann Ehlers A on: 03/05/2022 11:07 AM   Modules accepted: Orders

## 2022-03-05 NOTE — Progress Notes (Signed)
Cardiology Office Note  Date: 03/05/2022   ID: Tasha [name], Tasha [name] 24-Jan-1951, MRN [id number]  PCP:  Dettinger, [name], MD  Cardiologist:  Little Ishikawa, MD Electrophysiologist:  None   Reason for Office Visit: Evaluation of coronary artery calcifications on CT scan at the request of Dr. Louanne Skye   History of Present Illness: Tasha [name] is a 71 y.o. female known to have HTN, HLD, interstitial lung disease was referred to cardiology clinic for evaluation of coronary calcifications on CT scan at the request of Dr. Louanne Skye.  Patient was seen by Dr. Bjorn Pippin in Jan 2023 for evaluation of CAD. She underwent a CTA cardiac with calcium scoring which revealed 20 to 49% of nonobstructive CAD and calcium scoring of 100.  For paroxysmal A-fib, she underwent event monitor in 2021 and 2022 with no recurrence of A-fib. She is continuing diltiazem (as she has allergy to metoprolol) and not on AC. Patient is currently following up with pulmonology pending interstitial lung disease management and is enrolled in the medical trial. Patient is here for follow-up visit. She reported having burning chest pains lasting for 1 second whenever she sweeps the floor, occurs 2-3 times per week and is an ongoing issue for the last 1 year. The chest pain or burning pain did not last for more than 1 second at any point of time. Denied any DOE, lightheadedness, dizziness, syncope, LE swelling, palpitations.  Past Medical History:  Diagnosis Date   Acute respiratory failure (HCC)    CAP (community acquired pneumonia)    Hyperlipidemia    Hypertension    Metabolic syndrome     Past Surgical History:  Procedure Laterality Date   CESAREAN SECTION     COLONOSCOPY N/A 06/07/2019   Procedure: COLONOSCOPY;  Surgeon: West Bali, MD;  [facility];  Service: Endoscopy;  Laterality: N/A;  12:45   POLYPECTOMY  06/07/2019   Procedure: POLYPECTOMY;  Surgeon: West Bali, MD;  [facility];  Service: Endoscopy;;    Current Outpatient Medications  Medication Sig Dispense Refill   acetaminophen (TYLENOL) 500 MG tablet Take 1 tablet (500 mg total) by mouth every 6 (six) hours as needed. 30 tablet 0   aspirin EC 81 MG tablet Take 1 tablet (81 mg total) by mouth daily. 90 tablet 3   atorvastatin (LIPITOR) 20 MG tablet Take 1 tablet (20 mg total) by mouth daily at 6 PM. 90 tablet 3   budesonide (PULMICORT) 180 MCG/ACT inhaler Inhale 2 puffs into the lungs in the morning and at bedtime. 1 each 5   cetirizine (ZYRTEC) 10 MG tablet Take 1 tablet (10 mg total) by mouth daily as needed for allergies. 90 tablet 3   diltiazem (CARDIZEM CD) 120 MG 24 hr capsule Take 120 mg by mouth daily.     hydrochlorothiazide (HYDRODIURIL) 25 MG tablet Take 1 tablet (25 mg total) by mouth daily. 90 tablet 3   ibuprofen (ADVIL) 200 MG tablet Take 400 mg by mouth every 6 (six) hours as needed for headache or moderate pain.     Ipratropium-Albuterol (COMBIVENT RESPIMAT) 20-100 MCG/ACT AERS respimat Inhale 1 puff into the lungs every 6 (six) hours as needed for wheezing. 4 g 5   levothyroxine (SYNTHROID) 50 MCG tablet Take 1 tablet (50 mcg total) by mouth daily before breakfast. 90 tablet 3   losartan (COZAAR) 50 MG tablet Take 1 tablet (50 mg total) by mouth daily. 90 tablet 1   Omega 3 1000 MG CAPS  Take 1,000 mg by mouth daily.      ondansetron (ZOFRAN-ODT) 8 MG disintegrating tablet Take 1 tablet (8 mg total) by mouth every 6 (six) hours as needed for nausea or vomiting. 20 tablet 1   potassium chloride SA (KLOR-CON M) 20 MEQ tablet TAKE 1 TABLET BY MOUTH ONCE DAILY. 30 tablet 1   pantoprazole (PROTONIX) 40 MG tablet Take 1 tablet (40 mg total) by mouth daily. (Patient not taking: Reported on 03/05/2022) 90 tablet 3   No current facility-administered medications for this visit.   Allergies:  Hydromet [hydrocodone bit-homatrop mbr], Toprol xl [metoprolol], and Amoxicillin   Social History: The  patient  reports that she has quit smoking. She has never used smokeless tobacco. She reports that she does not drink alcohol and does not use drugs.   Family History: The patient's family history includes Breast cancer in her niece; Cancer in her father; Healthy in her brother, daughter, sister, son, and son; Memory loss in her sister.   ROS:  Please see the history of present illness. Otherwise, complete review of systems is positive for none.  All other systems are reviewed and negative.   Physical Exam: VS:  BP (!) 162/90   Pulse 79   Ht 5\' 1"  (1.549 m)   Wt 134 lb (60.8 kg)   SpO2 98%   BMI 25.32 kg/m , BMI Body mass index is 25.32 kg/m.  Wt Readings from Last 3 Encounters:  03/05/22 134 lb (60.8 kg)  02/07/22 138 lb (62.6 kg)  12/27/21 136 lb (61.7 kg)    General: Patient appears comfortable at rest. HEENT: Conjunctiva and lids normal, oropharynx clear with moist mucosa. Neck: Supple, no elevated JVP or carotid bruits, no thyromegaly. Lungs: Clear to auscultation, nonlabored breathing at rest. Cardiac: Regular rate and rhythm, no S3 or significant systolic murmur, no pericardial rub. Abdomen: Soft, nontender, no hepatomegaly, bowel sounds present, no guarding or rebound. Extremities: No pitting edema, distal pulses 2+. Skin: Warm and dry. Musculoskeletal: No kyphosis. Neuropsychiatric: Alert and oriented x3, affect grossly appropriate.  Recent Labwork: 09/21/2021: Hemoglobin 13.3; Platelets 159 12/27/2021: BUN 16; Creatinine, Ser 0.84; Potassium 3.9; Sodium 143; TSH 1.570 02/07/2022: ALT 91; AST 68     Component Value Date/Time   CHOL 112 09/21/2021 1056   CHOL 180 09/23/2012 1335   TRIG 83 09/21/2021 1056   TRIG 108 08/10/2014 0852   TRIG 135 09/23/2012 1335   HDL 37 (L) 09/21/2021 1056   HDL 40 08/10/2014 0852   HDL 37 (L) 09/23/2012 1335   CHOLHDL 3.0 09/21/2021 1056   LDLCALC 58 09/21/2021 1056   LDLCALC 118 (H) 05/04/2013 1528   LDLCALC 116 (H) 09/23/2012 1335    LDLDIRECT 77 07/11/2016 1536    Other Studies Reviewed Today: CT cardiac with calcium scoring in 03/2021 IMPRESSION: 1. Coronary calcium score of 100. This was 82nd percentile for age, sex, and race matched control. 2. Normal coronary origin with right dominance. 3. CAD-RADS 2. Mild non-obstructive CAD (25-49%). Consider non-atherosclerotic causes of chest pain. Consider preventive therapy and risk factor modification. 4. Aortic atherosclerosis.  Echo from 2022 LVEF 55 to 60% RV systolic function is normal Trivial pericardial effusion  Assessment and Plan: Patient is a 71 year old F known to have HTN, HLD, interstitial lung disease was referred to cardiology clinic for evaluation of coronary calcifications at the request of Dr. Louanne Skye.  #Noncardiac chest pain -Noncardiac causes of chest pain need to be evaluated  #Coronary artery calcifications on CT scan (mild nonobstructive  CAD, 30 to 49% on CTA cardiac in 2022) #Calcium score 100 -Continue aspirin 81 mg once daily -Continue atorvastatin 20 mg nightly  #HLD, currently at goal LDL 58 in 2023 -Continue atorvastatin 20 mg nightly.LDL goal less than 100.  #HTN, poorly controlled, rule out whitecoat hypertension -Recommend patient to check her blood pressures every day in the morning before she takes blood pressure medications and keep log to rule out whitecoat hypertension. -Continue HCTZ 25 mg once daily  #Paroxysmal A-fib, currently in NSR -Continue diltiazem 120 mg once daily (she has allergy to metoprolol) -CV score is 3. She will benefit from systemic anticoagulation due to worsening of interstitial lung disease. Currently, patient is enrolled in a medical trial and prefers to check with her pulmonologist about interactions of the investigational drug and Eliquis. If no interactions, instructed patient to call the clinic or her PCP for Eliquis prescription. I explained the risk and benefits of being on Eliquis. Risk of  ICH if she has falls in the future and benefit being stroke prevention. Patient voiced understanding.  I have spent a total of 43 minutes with patient reviewing chart , EKGs, labs and examining patient as well as establishing an assessment and plan that was discussed with the patient.  > 50% of time was spent in direct patient care. Spanish interpreter assisted my interview.   Medication Adjustments/Labs and Tests Ordered: Current medicines are reviewed at length with the patient today.  Concerns regarding medicines are outlined above.   Tests Ordered: No orders of the defined types were placed in this encounter.   Medication Changes: No orders of the defined types were placed in this encounter.   Disposition:  Follow up  1 year  Signed, Jasun Gasparini Verne Spurr, MD, 03/05/2022 8:33 AM    [facility] at [facility] [address] [place], [place] [address]

## 2022-03-05 NOTE — Patient Instructions (Addendum)
Medication Instructions:  Your physician recommends that you continue on your current medications as directed. Please refer to the Current Medication list given to you today.   Labwork: None  Testing/Procedures: None  Follow-Up: Follow up with Dr. Dellia Cloud in 1 year.   Any Other Special Instructions Will Be Listed Below (If Applicable).  Please contact Pulmonology to see if you can start Eliquis.    If you need a refill on your cardiac medications before your next appointment, please call your pharmacy.

## 2022-03-06 NOTE — Telephone Encounter (Signed)
ATC again for Ofev counseling. LVM with the daughter, Brayton Layman. Patient does not have a vm box set up.   Maryan Puls, PharmD PGY-1 [facility] Pharm[facility] Resident

## 2022-03-12 MED ORDER — OFEV 100 MG PO CAPS: 100.0000 mg | ORAL_CAPSULE | Freq: Two times a day (BID) | ORAL | 1 refills | Status: DC

## 2022-03-12 NOTE — Telephone Encounter (Addendum)
Subjective:  Patient's daughter Brayton Layman called today by [facility] pharmacy team for Ofev new start counseling.   Patient was last seen by Dr. Chase Caller on 02/07/2022. Pertinent past medical history includes probable UIP working toward definite UIP. No prior therapy.  Spoke with patient's daughter Brayton Layman about dosing, adverse events, management of diarrhea, monitoring, and medication delivery from specialty pharmacy.   History of CAD: Yes History of MI: No Current anticoagulant use: Yes History of HTN: Yes  History of elevated LFTs: Yes History of diarrhea, nausea, vomiting: No  Objective: Allergies  Allergen Reactions   Hydromet [Hydrocodone Bit-Homatrop Mbr]     Pt doesn't remember   Toprol Xl [Metoprolol] Hives    Facial rash, itching, hives   Amoxicillin Rash    Did it involve swelling of the face/tongue/throat, SOB, or low BP? No Did it involve sudden or severe rash/hives, skin peeling, or any reaction on the inside of your mouth or nose? Unknown Did you need to seek medical attention at a hospital or doctor's office? Unknown When did it last happen?      10 + years If all above answers are "NO", may proceed with cephalosporin use.     Outpatient Encounter Medications as of 02/20/2022  Medication Sig   acetaminophen (TYLENOL) 500 MG tablet Take 1 tablet (500 mg total) by mouth every 6 (six) hours as needed.   aspirin EC 81 MG tablet Take 1 tablet (81 mg total) by mouth daily.   atorvastatin (LIPITOR) 20 MG tablet Take 1 tablet (20 mg total) by mouth daily at 6 PM.   budesonide (PULMICORT) 180 MCG/ACT inhaler Inhale 2 puffs into the lungs in the morning and at bedtime.   cetirizine (ZYRTEC) 10 MG tablet Take 1 tablet (10 mg total) by mouth daily as needed for allergies.   diltiazem (CARDIZEM CD) 120 MG 24 hr capsule Take 120 mg by mouth daily.   hydrochlorothiazide (HYDRODIURIL) 25 MG tablet Take 1 tablet (25 mg total) by mouth daily.   ibuprofen (ADVIL) 200 MG  tablet Take 400 mg by mouth every 6 (six) hours as needed for headache or moderate pain.   Ipratropium-Albuterol (COMBIVENT RESPIMAT) 20-100 MCG/ACT AERS respimat Inhale 1 puff into the lungs every 6 (six) hours as needed for wheezing.   levothyroxine (SYNTHROID) 50 MCG tablet Take 1 tablet (50 mcg total) by mouth daily before breakfast.   losartan (COZAAR) 50 MG tablet Take 1 tablet (50 mg total) by mouth daily.   Omega 3 1000 MG CAPS Take 1,000 mg by mouth daily.    ondansetron (ZOFRAN-ODT) 8 MG disintegrating tablet Take 1 tablet (8 mg total) by mouth every 6 (six) hours as needed for nausea or vomiting.   pantoprazole (PROTONIX) 40 MG tablet Take 1 tablet (40 mg total) by mouth daily. (Patient not taking: Reported on 03/05/2022)   [DISCONTINUED] potassium chloride (KLOR-CON) 20 MEQ tablet Take 1 tablet (20 mEq total) by mouth daily.   No facility-administered encounter medications on file as of 02/20/2022.     Immunization History  Administered Date(s) Administered   Fluad Quad(high Dose 65+) 03/01/2019, 01/14/2020, 01/16/2021, 02/07/2022   Influenza, High Dose Seasonal PF 03/11/2017, 03/09/2018   Influenza,inj,Quad PF,6+ Mos 02/04/2014, 02/09/2015, 02/20/2016   Moderna Sars-Covid-2 Vaccination 05/21/2019, 06/18/2019   Pneumococcal Conjugate-13 08/10/2015   Pneumococcal Polysaccharide-23 06/17/2017, 12/21/2017   Tdap 12/31/2021   Zoster Recombinat (Shingrix) 09/21/2020, 06/21/2021      PFT's No results found for: "FEV1", "FVC", "FEV1FVC", "TLC", "DLCO"    CMP  Component Value Date/Time   NA 143 12/27/2021 1305   K 3.9 12/27/2021 1305   CL 102 12/27/2021 1305   CO2 27 12/27/2021 1305   GLUCOSE 90 12/27/2021 1305   GLUCOSE 102 (H) 01/31/2021 1304   BUN 16 12/27/2021 1305   CREATININE 0.84 12/27/2021 1305   CREATININE 0.70 09/23/2012 1335   CALCIUM 9.4 12/27/2021 1305   PROT 7.4 02/07/2022 1531   PROT 6.5 12/27/2021 1305   ALBUMIN 4.5 02/07/2022 1531   ALBUMIN 4.1  12/27/2021 1305   AST 68 (H) 02/07/2022 1531   ALT 91 (H) 02/07/2022 1531   ALKPHOS 61 02/07/2022 1531   BILITOT 0.4 02/07/2022 1531   BILITOT 0.3 12/27/2021 1305   GFRNONAA >60 01/31/2021 1304   GFRNONAA >89 09/23/2012 1335   GFRAA 93 03/23/2020 1042   GFRAA >89 09/23/2012 1335      CBC    Component Value Date/Time   WBC 7.6 09/21/2021 1056   WBC 9.1 09/06/2020 1429   RBC 4.29 09/21/2021 1056   RBC 4.25 09/06/2020 1429   HGB 13.3 09/21/2021 1056   HCT 40.0 09/21/2021 1056   PLT 159 09/21/2021 1056   MCV 93 09/21/2021 1056   MCH 31.0 09/21/2021 1056   MCH 30.8 09/06/2020 1429   MCHC 33.3 09/21/2021 1056   MCHC 33.3 09/06/2020 1429   RDW 13.1 09/21/2021 1056   LYMPHSABS 1.2 09/21/2021 1056   MONOABS 1.0 12/07/2019 1155   EOSABS 0.2 09/21/2021 1056   BASOSABS 0.0 09/21/2021 1056    LFT's    Latest Ref Rng & Units 02/07/2022    3:31 PM 12/27/2021    1:05 PM 09/21/2021   10:56 AM  Hepatic Function  Total Protein 6.0 - 8.3 g/dL 7.4  6.5  6.1   Albumin 3.5 - 5.2 g/dL 4.5  4.1  4.1   AST 0 - 37 U/L 68  65  73   ALT 0 - 35 U/L 91  89  95   Alk Phosphatase 39 - 117 U/L 61  70  63   Total Bilirubin 0.2 - 1.2 mg/dL 0.4  0.3  0.3   Bilirubin, Direct 0.0 - 0.3 mg/dL 0.1       HRCT (01/2022) -- Progressive ILD with probable UIP, aortic atherosclerosis, left main and three-vessel CAD, colonic diverticulosis.  Assessment and Plan  Ofev Medication Management Thoroughly counseled patient's daughter on the efficacy, mechanism of action, dosing, administration, adverse effects, and monitoring parameters of Ofev. Patient's daughter verbalized understanding.  Sent prescription to [facility] pharmacy.  Goals of Therapy: Will not stop or reverse the progression of ILD. It will slow the progression of ILD.  Inhibits tyrosine kinase inhibitors which slow the fibrosis/progression of ILD -Significant reduction in the rate of disease progression was observed after treatment (61.1%  [before] vs 33.3% [after], P?=?0.008) over 42 weeks.  Dosing: 100 mg (one capsule) by mouth twice daily (approx 12 hours apart). Discussed taking with food approximately 12 hours apart. Discussed that capsule should not be crushed or split.  Adverse Effects: Nausea, vomiting, diarrhea (2 in 3 patients) appetite loss, weight loss - management of diarrhea with loperamide discussed including max use of 48 hours and max of 8 capsules per day. Abdominal pain (up to 1 in 5 patients) Nasopharyngitis (13%), UTI (6%) Risk of thrombosis (3%) and acute MI (2%) Hypertension (5%) Dizziness Fatigue (10%)  Monitoring: Monitor for diarrhea, nausea and vomiting, GI perforation, hepatotoxicity  Monitor LFTs - baseline, 2 weeks from initiation (due to  baseline ALT/AST elevation), monthly for first 6 months, then every 3 months routinely CBC w differential at baseline and every 3 months routinely Patient requests labs drawn with PCP (Dr. Warrick Parisian) office  Access: Approval of Ofev through: insurance Rx sent to: [facility] (pulmonary fibrosis team): [phone]  Medication Reconciliation A drug regimen assessment was performed, including review of allergies, interactions, disease-state management, dosing and immunization history. Medications were reviewed with the patient, including name, instructions, indication, goals of therapy, potential side effects, importance of adherence, and safe use.  Anticoagulant use: No  Immunizations Patient is indicated for the influenzae, pneumonia, and shingles vaccinations. Patient has received 3 COVID19 vaccines.  This appointment required 20 minutes of patient care (this includes precharting, chart review, review of results, face-to-face care, etc.).  Thank you for involving pharmacy to assist in providing this patient's care.   [place] [facility] PharmD Candidate [phone]

## 2022-03-18 NOTE — Telephone Encounter (Signed)
Called [facility], confirmed OFEV is to be delivered to patient's home 03/21/2022.   Pharmacy also tried to get in contact with [place] over the weekend to confirm that patient is receiving hepatic functioning monitoring, informed them that she is indeed being monitored.   [place] [facility] PharmD Candidate [phone]

## 2022-03-21 ENCOUNTER — Ambulatory Visit (INDEPENDENT_AMBULATORY_CARE_PROVIDER_SITE_OTHER): Payer: Medicare Other | Admitting: Nurse Practitioner

## 2022-03-21 ENCOUNTER — Encounter: Payer: Self-pay | Admitting: Nurse Practitioner

## 2022-03-21 VITALS — BP 124/72 | HR 84 | Ht 61.0 in | Wt 132.8 lb

## 2022-03-21 DIAGNOSIS — J849 Interstitial pulmonary disease, unspecified: Secondary | ICD-10-CM

## 2022-03-21 NOTE — Progress Notes (Deleted)
_0  ID: Tasha [name], female    DOB: [birth year], 71 y.o.   MRN: [id number]  No chief complaint on file.   Referring provider: Dettinger, Fransisca Kaufmann, MD  HPI: 71 year old female, former smoker followed for interstitial lung disease and probable UIP pattern with progressive phenotype.  She is a patient of Dr. Golden Pop and last seen in office 02/07/2022.  Past medical history significant for  TEST/EVENTS:   02/07/2022: OV with Dr. Chase Caller.  In August 2019 she suffered acute lung injury, possibly [place] given nature of infiltrates and high ESR and positive ANA.  BAL showed lymphocytosis.  There was also some history of antifreeze exposure > ILD.  She has a history of environmental allergies mostly to grasses.  Intermittent ANA positive.  Presented for follow-up.  Doing well and feeling stable.  PFTs were also stable.  Unfortunately, she had a high-resolution CT chest which shows significant worsening.  Therefore she has a progressive phenotype and the pattern is looking like probable UIP working towards definite UIP, which would be consistent with IPF.  Everything did start after inhalation lung injury.  Shared decision to start on nintedanib.  Plan is to start with low-dose given her short stature.  SYMPTOM SCALE - ILD 06/11/2018   12/07/2019   01/25/2020 Last Weight  Most recent update: 01/25/2020  4:14 PM      Weight  65.1 kg (143 lb 9.6 oz)                     7/7/2022145# 04/27/2021   07/26/2021 Pulmonary rehabilitation at [facility] since February 2023  -filled by interpreter Verdis Frederickson 02/07/2022   03/21/2022  O2 use _1  ra ra   Shortness of Breath 0 -> 5 scale with 5 being worst (score 6 If unable to do)           0   At rest 0 0 0 0 0 2 0   Simple tasks - showers, clothes change, eating, shaving 0 3 0 0 0 2 0   Household (dishes, doing bed, laundry) 0 3 3 03 4 3 02   Shopping _2 03 0 1 0   Walking level at own pace 0 4 0 0 0 1 0   Walking stairs 0 _3 Total (40 - 48) Dyspnea Score _4 How bad is your cough? 0 _5 How bad is your fatigue _6 0   nausea   0 0 0 0 0 0   vomit   0 0 0 0 0 0   diarrhea   0 0 0 0 0 0   anzity   _7 0 00 0    Dep[resssio   meds 0 0 0 0 0   0       Simple office walk 185 feet x  3 laps goal with forehead probe 02/19/2018   03/31/2018   06/11/2018   12/07/2019   01/25/2020   10/26/2020   04/27/2021   03/21/2022  O2 used rooom air Room air Room air ra ra ra ra   Number laps completed 3 3 x 250 feet 3 x 236 feet 3 laops _8 Comments about pace Moderate pace Mod pace Normal pace avg pace   Avg pace avg   Resting Pulse Ox/HR  99% and 71/min 100% and 89/min 100% and 83/mn 100% and 83/min 99% and 99/min 99% and 83 98% and 83   Final Pulse Ox/HR 95% and 92/min 98% and 111 98% and 101/min 98% and 101/min 98% and 107/min 97% and 99 97% and 101   Desaturated </= 88% _0  non no   Desaturated <= 3% points yes no no no none no no   Got Tachycardic >/= 90/min _1  yes yes   Symptoms at end of test none none none Mild dyspnea none noe Mild dyspnea   Miscellaneous comments x x x             Allergies  Allergen Reactions   Hydromet [Hydrocodone Bit-Homatrop Mbr]     Pt doesn't remember   Toprol Xl [Metoprolol] Hives    Facial rash, itching, hives   Amoxicillin Rash    Did it involve swelling of the face/tongue/throat, SOB, or low BP? No Did it involve sudden or severe rash/hives, skin peeling, or any reaction on the inside of your mouth or nose? Unknown Did you need to seek medical attention at a hospital or doctor's office? Unknown When did it last happen?      10 + years If all above answers are "NO", may proceed with cephalosporin use.     Immunization History  Administered Date(s) Administered   Fluad Quad(high Dose 65+) 03/01/2019, 01/14/2020, 01/16/2021, 02/07/2022   Influenza, High Dose Seasonal PF 03/11/2017, 03/09/2018    Influenza,inj,Quad PF,6+ Mos 02/04/2014, 02/09/2015, 02/20/2016   Moderna Sars-Covid-2 Vaccination 05/21/2019, 06/18/2019   Pneumococcal Conjugate-13 08/10/2015   Pneumococcal Polysaccharide-23 06/17/2017, 12/21/2017   Tdap 12/31/2021   Zoster Recombinat (Shingrix) 09/21/2020, 06/21/2021    Past Medical History:  Diagnosis Date   Acute respiratory failure ([place])    CAP (community acquired pneumonia)    Hyperlipidemia    Hypertension    Metabolic syndrome     Tobacco History: Social History   Tobacco Use  Smoking Status Former  Smokeless Tobacco Never   Counseling given: Not Answered   Outpatient Medications Prior to Visit  Medication Sig Dispense Refill   acetaminophen (TYLENOL) 500 MG tablet Take 1 tablet (500 mg total) by mouth every 6 (six) hours as needed. 30 tablet 0   aspirin EC 81 MG tablet Take 1 tablet (81 mg total) by mouth daily. 90 tablet 3   atorvastatin (LIPITOR) 20 MG tablet Take 1 tablet (20 mg total) by mouth daily at 6 PM. 90 tablet 3   budesonide (PULMICORT) 180 MCG/ACT inhaler Inhale 2 puffs into the lungs in the morning and at bedtime. 1 each 5   cetirizine (ZYRTEC) 10 MG tablet Take 1 tablet (10 mg total) by mouth daily as needed for allergies. 90 tablet 3   diltiazem (CARDIZEM CD) 120 MG 24 hr capsule Take 120 mg by mouth daily.     hydrochlorothiazide (HYDRODIURIL) 25 MG tablet Take 1 tablet (25 mg total) by mouth daily. 90 tablet 3   ibuprofen (ADVIL) 200 MG tablet Take 400 mg by mouth every 6 (six) hours as needed for headache or moderate pain.     Ipratropium-Albuterol (COMBIVENT RESPIMAT) 20-100 MCG/ACT AERS respimat Inhale 1 puff into the lungs every 6 (six) hours as needed for wheezing. 4 g 5   levothyroxine (SYNTHROID) 50 MCG tablet Take 1 tablet (50 mcg total) by mouth daily before breakfast. 90 tablet 3   losartan (COZAAR) 50 MG tablet Take 1 tablet (50 mg total)  by mouth daily. 90 tablet 1   Nintedanib (OFEV) 100 MG CAPS Take 1 capsule (100 mg  total) by mouth 2 (two) times daily. 180 capsule 1   Omega 3 1000 MG CAPS Take 1,000 mg by mouth daily.      ondansetron (ZOFRAN-ODT) 8 MG disintegrating tablet Take 1 tablet (8 mg total) by mouth every 6 (six) hours as needed for nausea or vomiting. 20 tablet 1   pantoprazole (PROTONIX) 40 MG tablet Take 1 tablet (40 mg total) by mouth daily. (Patient not taking: Reported on 03/05/2022) 90 tablet 3   potassium chloride SA (KLOR-CON M) 20 MEQ tablet TAKE 1 TABLET BY MOUTH ONCE DAILY. 30 tablet 1   No facility-administered medications prior to visit.     Review of Systems:   Constitutional: No weight loss or gain, night sweats, fevers, chills, fatigue, or lassitude. HEENT: No headaches, difficulty swallowing, tooth/dental problems, or sore throat. No sneezing, itching, ear ache, nasal congestion, or post nasal drip CV:  No chest pain, orthopnea, PND, swelling in lower extremities, anasarca, dizziness, palpitations, syncope Resp: No shortness of breath with exertion or at rest. No excess mucus or change in color of mucus. No productive or non-productive. No hemoptysis. No wheezing.  No chest wall deformity GI:  No heartburn, indigestion, abdominal pain, nausea, vomiting, diarrhea, change in bowel habits, loss of appetite, bloody stools.  GU: No dysuria, change in color of urine, urgency or frequency.  No flank pain, no hematuria  Skin: No rash, lesions, ulcerations MSK:  No joint pain or swelling.  No decreased range of motion.  No back pain. Neuro: No dizziness or lightheadedness.  Psych: No depression or anxiety. Mood stable.     Physical Exam:  There were no vitals taken for this visit.  GEN: Pleasant, interactive, well-nourished/chronically-ill appearing/acutely-ill appearing/poorly-nourished/morbidly obese; in no acute distress.****** HEENT:  Normocephalic and atraumatic. EACs patent bilaterally. TM pearly gray with present light reflex bilaterally. PERRLA. Sclera white. Nasal  turbinates pink, moist and patent bilaterally. No rhinorrhea present. Oropharynx pink and moist, without exudate or edema. No lesions, ulcerations, or postnasal drip.  NECK:  Supple w/ fair ROM. No JVD present. Normal carotid impulses w/o bruits. Thyroid symmetrical with no goiter or nodules palpated. No lymphadenopathy.   CV: RRR, no m/r/g, no peripheral edema. Pulses intact, +2 bilaterally. No cyanosis, pallor or clubbing. PULMONARY:  Unlabored, regular breathing. Clear bilaterally A&P w/o wheezes/rales/rhonchi. No accessory muscle use.  GI: BS present and normoactive. Soft, non-tender to palpation. No organomegaly or masses detected. No CVA tenderness. MSK: No erythema, warmth or tenderness. Cap refil <2 sec all extrem. No deformities or joint swelling noted.  Neuro: A/Ox3. No focal deficits noted.   Skin: Warm, no lesions or rashe Psych: Normal affect and behavior. Judgement and thought content appropriate.     Lab Results:  CBC    Component Value Date/Time   WBC 7.6 09/21/2021 1056   WBC 9.1 09/06/2020 1429   RBC 4.29 09/21/2021 1056   RBC 4.25 09/06/2020 1429   HGB 13.3 09/21/2021 1056   HCT 40.0 09/21/2021 1056   PLT 159 09/21/2021 1056   MCV 93 09/21/2021 1056   MCH 31.0 09/21/2021 1056   MCH 30.8 09/06/2020 1429   MCHC 33.3 09/21/2021 1056   MCHC 33.3 09/06/2020 1429   RDW 13.1 09/21/2021 1056   LYMPHSABS 1.2 09/21/2021 1056   MONOABS 1.0 12/07/2019 1155   EOSABS 0.2 09/21/2021 1056   BASOSABS 0.0 09/21/2021 1056    BMET  Component Value Date/Time   NA 143 12/27/2021 1305   K 3.9 12/27/2021 1305   CL 102 12/27/2021 1305   CO2 27 12/27/2021 1305   GLUCOSE 90 12/27/2021 1305   GLUCOSE 102 (H) 01/31/2021 1304   BUN 16 12/27/2021 1305   CREATININE 0.84 12/27/2021 1305   CREATININE 0.70 09/23/2012 1335   CALCIUM 9.4 12/27/2021 1305   GFRNONAA >60 01/31/2021 1304   GFRNONAA >89 09/23/2012 1335   GFRAA 93 03/23/2020 1042   GFRAA >89 09/23/2012 1335    BNP     Component Value Date/Time   BNP 77.7 12/16/2017 1924     Imaging:  No results found.       Latest Ref Rng & Units 02/07/2022    1:39 PM 07/26/2021    9:08 AM 04/27/2021   10:21 AM 12/10/2019   12:24 PM 06/11/2018   10:31 AM  PFT Results  FVC-Pre L 2.04  2.05  1.98  2.13  2.05   FVC-Predicted Pre % 78  78  75  79  74   FVC-Post L    2.18    FVC-Predicted Post %    81    Pre FEV1/FVC % % 92  91  89  91  92   Post FEV1/FCV % %    92    FEV1-Pre L 1.88  1.86  1.77  1.94  1.88   FEV1-Predicted Pre % 95  94  88  96  90   FEV1-Post L    2.01    DLCO uncorrected ml/min/mmHg 14.38  11.06  10.43   13.22   DLCO UNC% % 82  63  59   74   DLCO corrected ml/min/mmHg 14.38  11.06  10.28     DLCO COR %Predicted % 82  63  58     DLVA Predicted % 114  83  80   95     No results found for: "NITRICOXIDE"      Assessment & Plan:   No problem-specific Assessment & Plan notes found for this encounter.   I spent *** minutes of dedicated to the care of this patient on the date of this encounter to include pre-visit review of records, face-to-face time with the patient discussing conditions above, post visit ordering of testing, clinical documentation with the electronic health record, making appropriate referrals as documented, and communicating necessary findings to members of the patients care team.  Clayton Bibles, NP 03/21/2022  Pt aware and understands NP's role.

## 2022-03-22 ENCOUNTER — Other Ambulatory Visit: Payer: Self-pay | Admitting: Family Medicine

## 2022-03-29 ENCOUNTER — Ambulatory Visit (INDEPENDENT_AMBULATORY_CARE_PROVIDER_SITE_OTHER): Payer: Medicare Other | Admitting: Family Medicine

## 2022-03-29 ENCOUNTER — Encounter: Payer: Self-pay | Admitting: Family Medicine

## 2022-03-29 VITALS — BP 157/76 | HR 79 | Temp 98.2°F | Ht 61.0 in | Wt 135.0 lb

## 2022-03-29 DIAGNOSIS — I2510 Atherosclerotic heart disease of native coronary artery without angina pectoris: Secondary | ICD-10-CM

## 2022-03-29 DIAGNOSIS — E039 Hypothyroidism, unspecified: Secondary | ICD-10-CM | POA: Diagnosis not present

## 2022-03-29 DIAGNOSIS — R7303 Prediabetes: Secondary | ICD-10-CM

## 2022-03-29 DIAGNOSIS — E785 Hyperlipidemia, unspecified: Secondary | ICD-10-CM | POA: Diagnosis not present

## 2022-03-29 DIAGNOSIS — I10 Essential (primary) hypertension: Secondary | ICD-10-CM

## 2022-03-29 LAB — BAYER DCA HB A1C WAIVED: HB A1C (BAYER DCA - WAIVED): 5.7 % — ABNORMAL HIGH (ref 4.8–5.6)

## 2022-03-29 NOTE — Progress Notes (Signed)
Visit canceled. Yet to start [place]. Symptoms otherwise stable. She is supposed to receive shipment today, 11/30. Rescheduled for 4 weeks.

## 2022-03-29 NOTE — Patient Instructions (Signed)
Fibrilacin auricular Atrial Fibrillation  La fibrilacin auricular es un tipo de latido cardaco irregular o rpido (arritmia). En la fibrilacin auricular, la parte superior del corazn (aurculas) late con un patrn irregular. Esto hace que el corazn no pueda bombear sangre de [place] normal y Armed forces logistics/support/administrative officer. El [place] del tratamiento es prevenir la formacin de cogulos de [place], Chief Technology Officer la frecuencia cardaca o Medical laboratory scientific officer el ritmo normal de los latidos cardacos. Si esta afeccin no se trata, puede causar graves complicaciones, como el debilitamiento del msculo cardaco (miocardiopata) o un accidente cerebrovascular. Cules son las causas? Esta afeccin suele ser causada por otras afecciones que daan el sistema elctrico del corazn. Estas incluyen lo siguiente: Presin arterial alta (hipertensin arterial). Esta es la causa ms frecuente. Ciertos problemas o afecciones del corazn, como insuficiencia cardaca, arteriopata coronaria, problemas en las vlvulas cardacas o ciruga cardaca. Diabetes. Hiperactividad de la glndula tiroidea (hipertiroidismo). Obesidad. Enfermedad renal crnica. En algunos casos, se desconoce la causa de esta afeccin. Qu incrementa el riesgo? Es ms probable que Orthoptist en: Personas de edad avanzada. Fumadores. Deportistas que realizan ejercicios de resistencia. Las personas que tienen antecedentes familiares de fibrilacin auricular. Hombres. Personas que consumen drogas. Personas que beben mucho alcohol. Personas con afecciones pulmonares, como enfisema, neumona o EPOC. Personas que tienen apnea obstructiva del sueo. Cules son los signos o sntomas? Los sntomas de esta afeccin incluyen: Sensacin de que el corazn late rpida o irregularmente. Malestar o Tourist information centre manager. Falta de aire. Mareos o debilidad repentinos. Cansarse fcilmente al hacer ejercicio o actividad fsica. Fatiga. Sncope (episodio de  desmayo). Sudoracin. En algunos casos, no hay sntomas. Cmo se diagnostica? El mdico puede detectar la fibrilacin auricular al tomarle el pulso. Si se detecta, esta afeccin podra diagnosticarse a travs de lo siguiente: Un electrocardiograma (ECG) para controlar las seales elctricas del corazn. Un monitor cardaco ambulatorio para Financial planner actividad del corazn durante algunos das. Un ecocardiograma transtorcico (ETT) para obtener imgenes del corazn. Un ecocardiograma transesofgico (ETE) para obtener imgenes incluso ms cercanas del corazn. Una ergometra para evaluar la irrigacin sangunea mientras hace ejercicio. Estudios de diagnstico por imgenes, como una exploracin por tomografa computarizada (TC) o una radiografa torcica. Anlisis de [place]. Cmo se trata? El tratamiento depende de las afecciones subyacentes y de cmo se siente cuando tiene fibrilacin auricular. El tratamiento de esta afeccin puede incluir: Medicamentos para prevenir la formacin de cogulos de sangre o tratar los problemas de frecuencia cardaca o ritmo cardaco. Cardioversin elctrica para restablecer el ritmo cardaco. Un marcapasos para corregir el ritmo cardaco anormal. Ablacin para extirpar el tejido cardaco que enva seales anormales. Cierre de Production manager auricular izquierda para sellar la zona donde pueden formarse cogulos de [place]. En algunos casos, se tratarn las afecciones subyacentes. Siga estas instrucciones en su casa: Medicamentos Tome los medicamentos de venta libre y los recetados solamente como se lo haya indicado el mdico. No tome medicamentos nuevos sin antes Teacher, adult education con su mdico. Si est tomando anticoagulantes, tenga en cuenta lo siguiente: Hable con el mdico antes de tomar cualquier medicamento que contenga aspirina o antiinflamatorios no esteroideos (AINE), como el ibuprofeno. Estos medicamentos aumentan el riesgo de tener una hemorragia  peligrosa. Tome los [employer] se lo indicaron, [employer] a la misma hora. Evite las actividades que podran causarle lesiones o moretones y [place] cadas. Use un brazalete de alerta mdica o lleve una tarjeta con una lista de los medicamentos que toma. Estilo de  vida     No consuma ningn producto que contenga nicotina o tabaco, como cigarrillos, cigarrillos electrnicos y tabaco de Higher education careers adviser. Si necesita ayuda para dejar de fumar, consulte al mdico. Consuma alimentos cardiosaludables. Hable con un nutricionista para crear un plan de alimentacin que sea adecuado para usted. Haga actividad fsica habitualmente como se lo haya indicado el mdico. No beba alcohol. Baje de peso si es necesario. No consuma drogas, ni siquiera cannabis. Instrucciones generales Si tiene apnea obstructiva del sueo, controle la afeccin como se lo haya indicado el mdico. No tome pastillas para adelgazar a menos que el mdico lo autorice. Estas pastillas pueden agravar los problemas cardacos. Concurra a todas las visitas de seguimiento como se lo haya indicado el mdico. Esto es importante. Comunquese con un mdico si: Nota un cambio en la frecuencia, el ritmo o la fuerza de los latidos cardacos. Toma anticoagulantes y observa ms moretones. Se cansa con ms facilidad cuando hace ejercicio o hace trabajos pesados. Tiene cambios repentinos en [employer]. Solicite ayuda inmediatamente si tiene:  Dolor de pecho, dolor abdominal, sudoracin o debilidad. Dificultad para respirar. Efectos secundarios de los anticoagulantes, como sangre en el vmito, las heces o la [place], o sangrado que no puede detenerse. Cualquier sntoma de un accidente cerebrovascular. "BE FAST" es una manera fcil de recordar las principales seales de advertencia de un accidente cerebrovascular: B - Balance (equilibrio). Los signos son mareos, dificultad repentina para caminar o prdida del  equilibrio. E - Eyes (ojos). Los signos son problemas para ver o un cambio repentino en la visin. F - Face (rostro). Los signos son debilidad repentina o adormecimiento del rostro, o el rostro o el prpado que se caen hacia un lado. A - Arms (brazos). Los signos son debilidad o adormecimiento en un brazo. Esto sucede de repente y generalmente en un lado del cuerpo. S - Speech (habla). Los signos son dificultad para hablar, hablar arrastrando las palabras o dificultad para comprender lo que la gente dice. T - Time (tiempo). Es tiempo de llamar al servicio de Multimedia programmer. Anote la hora a la que [employer] sntomas. Otros signos de accidente cerebrovascular, tales como: Dolor de cabeza sbito e intenso que no tiene causa aparente. Nuseas o vmitos. Convulsiones. Estos sntomas pueden representar un problema grave que constituye Engineer, maintenance (IT). No espere a ver si los sntomas desaparecen. Solicite atencin mdica de inmediato. Comunquese con el servicio de emergencias de su localidad (911 en los [place]). No conduzca por sus propios medios Principal Financial. Resumen La fibrilacin auricular es un tipo de latido cardaco irregular o rpido (arritmia). Los sntomas incluyen sensacin de que el corazn late rpida o irregularmente. Es posible que le den medicamentos para prevenir la formacin de cogulos de sangre o tratar los problemas de frecuencia cardaca o ritmo cardaco. Obtenga ayuda de inmediato si tiene signos o sntomas de un accidente cerebrovascular. Busque ayuda de inmediato si no puede respirar o siente dolor o presin en el pecho. Esta informacin no tiene Marine scientist el consejo del mdico. Asegrese de hacerle al mdico cualquier pregunta que tenga. Document Revised: 11/24/2018 Document Reviewed: 11/24/2018 Elsevier Patient Education  [place].

## 2022-03-29 NOTE — Progress Notes (Signed)
BP (!) 157/76   Pulse 79   Temp 98.2 F (36.8 C)   Ht 5\' 1"  (1.549 m)   Wt 135 lb (61.2 kg)   SpO2 98%   BMI 25.51 kg/m    Subjective:   [name], female    DOB: December 13, 1950, 71 y.o.   MRN: [id number]  HPI: Tasha [name] is a 71 y.o. female presenting on 03/29/2022 for Medical Management of Chronic Issues, Hypertension, and Prediabetes   HPI Hypertension Patient is currently on hydrochlorothiazide and losartan, and their blood pressure today is 157/76 but home blood pressures yesterday and on multiple occasions are 120s to 130s. Patient denies any lightheadedness or dizziness. Patient denies headaches, blurred vision, chest pains, shortness of breath, or weakness. Denies any side effects from medication and is content with current medication.   Hyperlipidemia Patient is coming in for recheck of his hyperlipidemia. The patient is currently taking atorvastatin and fish oil. They deny any issues with myalgias or history of liver damage from it. They deny any focal numbness or weakness or chest pain.   Prediabetes Patient comes in today for recheck of his diabetes. Patient has been currently taking no medication currently, diet control. Patient is currently on an ACE inhibitor/ARB. Patient has seen an ophthalmologist this year. Patient denies any issues with their feet. The symptom started onset as an adult hyperlipidemia and hypertension and A-fib ARE RELATED TO DM   Hypothyroidism recheck Patient is coming in for thyroid recheck today as well. They deny any issues with hair changes or heat or cold problems or diarrhea or constipation. They deny any chest pain or palpitations. They are currently on levothyroxine 50 micrograms   Relevant past medical, surgical, family and social history reviewed and updated as indicated. Interim medical history since our last visit reviewed. Allergies and medications reviewed and updated.  Review of Systems  Constitutional:   Negative for chills and fever.  Eyes:  Negative for visual disturbance.  Respiratory:  Negative for chest tightness and shortness of breath.   Cardiovascular:  Negative for chest pain and leg swelling.  Musculoskeletal:  Negative for back pain and gait problem.  Skin:  Negative for rash.  Neurological:  Negative for light-headedness and headaches.  Psychiatric/Behavioral:  Negative for agitation, behavioral problems and dysphoric mood. The patient is not nervous/anxious.   All other systems reviewed and are negative.   Per HPI unless specifically indicated above   Allergies as of 03/29/2022       Reactions   Hydromet [hydrocodone Bit-homatrop Mbr]    Pt doesn't remember   Toprol Xl [metoprolol] Hives   Facial rash, itching, hives   Amoxicillin Rash   Did it involve swelling of the face/tongue/throat, SOB, or low BP? No Did it involve sudden or severe rash/hives, skin peeling, or any reaction on the inside of your mouth or nose? Unknown Did you need to seek medical attention at a hospital or doctor's office? Unknown When did it last happen?      10 + years If all above answers are "NO", may proceed with cephalosporin use.        Medication List        Accurate as of March 29, 2022 10:38 AM. If you have any questions, ask your nurse or doctor.          acetaminophen 500 MG tablet Commonly known as: TYLENOL Take 1 tablet (500 mg total) by mouth every 6 (six) hours as needed.   aspirin EC  81 MG tablet Take 1 tablet (81 mg total) by mouth daily.   atorvastatin 20 MG tablet Commonly known as: LIPITOR Take 1 tablet (20 mg total) by mouth daily at 6 PM.   budesonide 180 MCG/ACT inhaler Commonly known as: PULMICORT Inhale 2 puffs into the lungs in the morning and at bedtime.   cetirizine 10 MG tablet Commonly known as: ZYRTEC Take 1 tablet (10 mg total) by mouth daily as needed for allergies.   Combivent Respimat 20-100 MCG/ACT Aers respimat Generic drug:  Ipratropium-Albuterol Inhale 1 puff into the lungs every 6 (six) hours as needed for wheezing.   diltiazem 120 MG 24 hr capsule Commonly known as: CARDIZEM CD Take 120 mg by mouth daily.   hydrochlorothiazide 25 MG tablet Commonly known as: HYDRODIURIL Take 1 tablet (25 mg total) by mouth daily.   ibuprofen 200 MG tablet Commonly known as: ADVIL Take 400 mg by mouth every 6 (six) hours as needed for headache or moderate pain.   levothyroxine 50 MCG tablet Commonly known as: SYNTHROID Take 1 tablet (50 mcg total) by mouth daily before breakfast.   losartan 50 MG tablet Commonly known as: COZAAR Take 1 tablet (50 mg total) by mouth daily.   Ofev 100 MG Caps Generic drug: Nintedanib Take 1 capsule (100 mg total) by mouth 2 (two) times daily.   Omega 3 1000 MG Caps Take 1,000 mg by mouth daily.   ondansetron 8 MG disintegrating tablet Commonly known as: ZOFRAN-ODT Take 1 tablet (8 mg total) by mouth every 6 (six) hours as needed for nausea or vomiting.   pantoprazole 40 MG tablet Commonly known as: PROTONIX Take 1 tablet (40 mg total) by mouth daily.   potassium chloride SA 20 MEQ tablet Commonly known as: KLOR-CON M TAKE 1 TABLET BY MOUTH ONCE DAILY.         Objective:   BP (!) 157/76   Pulse 79   Temp 98.2 F (36.8 C)   Ht 5\' 1"  (1.549 m)   Wt 135 lb (61.2 kg)   SpO2 98%   BMI 25.51 kg/m   Wt Readings from Last 3 Encounters:  03/29/22 135 lb (61.2 kg)  03/21/22 132 lb 12.8 oz (60.2 kg)  03/05/22 134 lb (60.8 kg)    Physical Exam Vitals and nursing note reviewed.  Constitutional:      General: She is not in acute distress.    Appearance: She is well-developed. She is not diaphoretic.  Eyes:     Conjunctiva/sclera: Conjunctivae normal.  Cardiovascular:     Rate and Rhythm: Normal rate and regular rhythm.     Heart sounds: Normal heart sounds. No murmur heard. Pulmonary:     Effort: Pulmonary effort is normal. No respiratory distress.     Breath  sounds: Normal breath sounds. No wheezing.  Musculoskeletal:        General: No swelling or tenderness. Normal range of motion.  Skin:    General: Skin is warm and dry.     Findings: No rash.  Neurological:     Mental Status: She is alert and oriented to person, place, and time.     Coordination: Coordination normal.  Psychiatric:        Behavior: Behavior normal.       Assessment & Plan:   Problem List Items Addressed This Visit       Cardiovascular and Mediastinum   Hypertension (Chronic)     Endocrine   Hypothyroidism     Other  Hyperlipidemia with target LDL less than 100 (Chronic)   Prediabetes - Primary   Relevant Orders   Bayer DCA Hb A1c Waived    A1c looks good at 5.6.  No changes.  Monitor blood pressure at home Follow up plan: Return in about 3 months (around 06/28/2022), or if symptoms worsen or fail to improve, for Hypertension hyperlipidemia and prediabetes.  Counseling provided for all of the vaccine components Orders Placed This Encounter  Procedures   Bayer DCA Hb A1c Waived    Arville Care, MD [facility] 03/29/2022, 10:38 AM

## 2022-04-19 ENCOUNTER — Encounter: Payer: Self-pay | Admitting: Nurse Practitioner

## 2022-04-19 ENCOUNTER — Ambulatory Visit (INDEPENDENT_AMBULATORY_CARE_PROVIDER_SITE_OTHER): Payer: Medicare Other | Admitting: Nurse Practitioner

## 2022-04-19 ENCOUNTER — Telehealth: Payer: Self-pay | Admitting: *Deleted

## 2022-04-19 VITALS — BP 160/82 | HR 90 | Ht 61.0 in | Wt 134.6 lb

## 2022-04-19 DIAGNOSIS — J4520 Mild intermittent asthma, uncomplicated: Secondary | ICD-10-CM | POA: Diagnosis not present

## 2022-04-19 DIAGNOSIS — J849 Interstitial pulmonary disease, unspecified: Secondary | ICD-10-CM | POA: Diagnosis not present

## 2022-04-19 LAB — COMPREHENSIVE METABOLIC PANEL
ALT: 57 U/L — ABNORMAL HIGH (ref 0–35)
AST: 46 U/L — ABNORMAL HIGH (ref 0–37)
Albumin: 4.3 g/dL (ref 3.5–5.2)
Alkaline Phosphatase: 53 U/L (ref 39–117)
BUN: 17 mg/dL (ref 6–23)
CO2: 31 mEq/L (ref 19–32)
Calcium: 9.6 mg/dL (ref 8.4–10.5)
Chloride: 104 mEq/L (ref 96–112)
Creatinine, Ser: 0.84 mg/dL (ref 0.40–1.20)
GFR: 69.71 mL/min (ref >60.00)
Glucose, Bld: 103 mg/dL — ABNORMAL HIGH (ref 70–99)
Potassium: 3.6 mEq/L (ref 3.5–5.1)
Sodium: 142 mEq/L (ref 135–145)
Total Bilirubin: 0.7 mg/dL (ref 0.2–1.2)
Total Protein: 7.1 g/dL (ref 6.0–8.3)

## 2022-04-19 NOTE — Telephone Encounter (Signed)
Called and spoke with Nira Conn at [facility].  The Ofev should have been delivered on 12/28, however was delayed and should arrive today by 9 pm.  Patient made aware and verified understanding through interpreter.  Nothing further needed.

## 2022-04-19 NOTE — Assessment & Plan Note (Signed)
Possible underlying asthma. She has been on budesonide for management. Stable for quite some time. She would like to try coming off of this. We will de-escalate her to PRN use for SOB/wheezing. She understands that if her breathing worsens or develops new respiratory symptoms, she may need to restart daily use. Asthma action plan in place.

## 2022-04-19 NOTE — Patient Instructions (Addendum)
-  Continue nintedanib (Ofev) 100 mg Twice daily. Take with food -Ok to try using budesonide inhaler Twice daily as needed for shortness of breath or wheezing. If you notice that you are using it on a consistent basis or your breathing/activity tolerance worsens with this change, go back to using it on a scheduled basis of twice daily -Continue duoneb 3 mL neb every 6 hours as needed for shortness of breath or wheezing.  -Continue cetirizine (zyrtec) 1 tab daily for allergies -Continue pantoprazole 1 tab daily for reflux  Labs today - CMET  Follow up in 8 weeks with Dr. Chase Caller (1st) or Katie Yaniel Limbaugh,NP and repeat spirometry/DLCO beforehand. If symptoms worsen, please contact office for sooner follow up or seek emergency care.

## 2022-04-19 NOTE — Assessment & Plan Note (Signed)
Progressive phenotype; now behaving like IPF. She was started on antifibrotics with nintedanib in November 2023. Tolerating well. Symptom score is improved today. Walk test without desaturation. She is clinically stable or even slightly improved. Encouraged her to continue activity as tolerated. Recheck CMET today with plans to repeat in 4 weeks. She will have repeat spiro/DLCO upon return.  Patient Instructions  -Continue nintedanib (Ofev) 100 mg Twice daily. Take with food -Ok to try using budesonide inhaler Twice daily as needed for shortness of breath or wheezing. If you notice that you are using it on a consistent basis or your breathing/activity tolerance worsens with this change, go back to using it on a scheduled basis of twice daily -Continue duoneb 3 mL neb every 6 hours as needed for shortness of breath or wheezing.  -Continue cetirizine (zyrtec) 1 tab daily for allergies -Continue pantoprazole 1 tab daily for reflux  Labs today - CMET  Follow up in 8 weeks with Dr. Chase Caller (1st) or Katie Greenley Martone,NP and repeat spirometry/DLCO beforehand. If symptoms worsen, please contact office for sooner follow up or seek emergency care.

## 2022-04-19 NOTE — Progress Notes (Signed)
@Patient  ID: Tasha [name], female    DOB: [birth year], 71 y.o.   MRN: [id number]  Chief Complaint  Patient presents with   Follow-up    Referring provider: Dettinger, [name], MD  HPI: 71 year old female, former smoker followed for ILD. She is a patient of Dr. Jane Canary and last seen in office 02/07/2022. Past medical history significant for environmental allergies, PAF, HTN, GERD, hypothyroid, prediabetes, HLD.  August 2019, suffered from acute lung injury possibly Boop based on the nature of infiltrates and a high ESR and positive ANA.  She had bronchoscopy with BAL but showed lymphocytosis.  There was some history of antifreeze exposure as well.  Following this, she was diagnosed with ILD.  Over the years, had progressive changes on CT imaging.  Most recently completed October 2023 with concern for UIP.  She was started on Ofev in November 2023.  TEST/EVENTS:  06/11/2018 PFT: FVC 74, FEV1 90, ratio 92, DLCOunc 74 12/07/2019: Positive RAST for various grasses, IgE normal; eos 200 12/10/2019 PFT: FVC 79, FEV1 96, ratio 92.  Unable to complete DLCO.  No BD. 01/26/2020: Autoimmune workup negative aside from positive ANA 1:640 03/08/2021 echo: EF 55 to 60%.  RV size and function normal.  Normal PASP.  Trivial pericardial effusion.  Trivial MR. 04/27/2021 PFT: FVC 75, FEV1 88, ratio 89, DLCOcor 58 07/26/2021 PFT: FVC 78, FEV1 94, ratio 91, DLCO 63 01/26/2022 HRCT chest: Atherosclerosis.  No LAD.  Progressive interstitial lung disease; probable UIP. 02/07/2022 PFT: FVC 78, FEV1 95, ratio 92, DLCO 82  02/07/2022: OV with Dr. Marchelle Gearing.  Symptom scores and PFTs have been stable.  However, high-resolution CT chest showed significant worsening and probable UIP pattern.  She was diagnosed with progressive phenotype, behaving like IPF.  Reviewed risk and benefits of starting antifibrotic therapy.  Shared decision to move forward with Ofev.  04/19/2022: Today-follow-up Patient presents today with  interpreter for follow-up.  She actually came to see me around 4 weeks ago but she had yet to start on Ofev therapy and given her stability, we postpone her appointment.  Since then, she has started on her antifibrotic's; believes the date was around 11/30.  She has been tolerating these well.  Has not noticed any significant side effects.  Overall, she feels like her breathing is stable.  She actually feels like it has been a little bit better over the last few months.  She feels like she has been trying to work on her exercise and activity status.  She was able to go shopping over Christmas and did not have any difficulty walking through the stores.  Her cough is very minimal, almost nonexistent.  She denies any wheezing, chest congestion, lower extremity swelling, orthopnea.  She is using budesonide inhaler twice daily.  Is not sure if this is made much of a difference for her.  She would like to see if she does okay using it less often.  Never uses her nebulizer treatments.  SYMPTOM SCALE - ILD 06/11/2018   12/07/2019   01/25/2020 Last Weight  Most recent update: 01/25/2020  4:14 PM      Weight  65.1 kg (143 lb 9.6 oz)                     7/7/2022145# 04/27/2021   07/26/2021 Pulmonary rehabilitation at [facility] since February 2023  -filled by interpreter [name] 02/07/2022   04/19/2022  O2 use ra ra ra ra ra ra ra ra  Shortness  of Breath 0 -> 5 scale with 5 being worst (score 6 If unable to do)           0   At rest 0 0 0 0 0 2 0 0  Simple tasks - showers, clothes change, eating, shaving 0 3 0 0 0 2 0 0  Household (dishes, doing bed, laundry) 0 3 3 03 4 3 02 0  Shopping 1 4 3  03 0 1 0 0  Walking level at own pace 0 4 0 0 0 1 0 0  Walking stairs 0 6 4 4 2 3 4 1   Total (40 - 48) Dyspnea Score 1 20 10 10 6 12 6 1   How bad is your cough? 0 5 3 4 4 2 1 1   How bad is your fatigue 3 4 4 3 3 3  0 0  nausea   0 0 0 0 0 0 0  vomit   0 0 0 0 0 0 0  diarrhea   0 0 0 0 0 0 0  anzity   3 3 3  0 00 0  0   Dep[resssio   meds 0 0 0 0 0 0  0       Simple office walk 185 feet x  3 laps goal with forehead probe 02/19/2018   03/31/2018   06/11/2018   12/07/2019   01/25/2020   10/26/2020   04/27/2021   04/19/2022  O2 used rooom air Room air Room air ra ra ra ra ra  Number laps completed 3 3 x 250 feet 3 x 236 feet 3 laops 3 3 3 3   Comments about pace Moderate pace Mod pace Normal pace avg pace   Avg pace avg avg  Resting Pulse Ox/HR 99% and 71/min 100% and 89/min 100% and 83/mn 100% and 83/min 99% and 99/min 99% and 83 98% and 83 98% and 80  Final Pulse Ox/HR 95% and 92/min 98% and 111 98% and 101/min 98% and 101/min 98% and 107/min 97% and 99 97% and 101 98% and 92  Desaturated </= 88% no no no no no non no no  Desaturated <= 3% points yes no no no none no no no  Got Tachycardic >/= 90/min yes yes yes yes yes yes yes no  Symptoms at end of test none none none Mild dyspnea none noe Mild dyspnea none  Miscellaneous comments x x x              Allergies  Allergen Reactions   Hydromet [Hydrocodone Bit-Homatrop Mbr]     Pt doesn't remember   Toprol Xl [Metoprolol] Hives    Facial rash, itching, hives   Amoxicillin Rash    Did it involve swelling of the face/tongue/throat, SOB, or low BP? No Did it involve sudden or severe rash/hives, skin peeling, or any reaction on the inside of your mouth or nose? Unknown Did you need to seek medical attention at a hospital or doctor's office? Unknown When did it last happen?      10 + years If all above answers are "NO", may proceed with cephalosporin use.     Immunization History  Administered Date(s) Administered   Fluad Quad(high Dose 65+) 03/01/2019, 01/14/2020, 01/16/2021, 02/07/2022   Influenza, High Dose Seasonal PF 03/11/2017, 03/09/2018   Influenza,inj,Quad PF,6+ Mos 02/04/2014, 02/09/2015, 02/20/2016   Moderna Sars-Covid-2 Vaccination 05/21/2019, 06/18/2019   Pneumococcal Conjugate-13 08/10/2015   Pneumococcal Polysaccharide-23 06/17/2017,  12/21/2017   Tdap 12/31/2021   Zoster Recombinat (  Shingrix) 09/21/2020, 06/21/2021    Past Medical History:  Diagnosis Date   Acute respiratory failure (HCC)    CAP (community acquired pneumonia)    Hyperlipidemia    Hypertension    Metabolic syndrome     Tobacco History: Social History   Tobacco Use  Smoking Status Former  Smokeless Tobacco Never   Counseling given: Not Answered   Outpatient Medications Prior to Visit  Medication Sig Dispense Refill   budesonide (PULMICORT) 180 MCG/ACT inhaler Inhale 2 puffs into the lungs in the morning and at bedtime. 1 each 5   ibuprofen (ADVIL) 200 MG tablet Take 400 mg by mouth every 6 (six) hours as needed for headache or moderate pain.     Nintedanib (OFEV) 100 MG CAPS Take 1 capsule (100 mg total) by mouth 2 (two) times daily. 180 capsule 1   acetaminophen (TYLENOL) 500 MG tablet Take 1 tablet (500 mg total) by mouth every 6 (six) hours as needed. 30 tablet 0   aspirin EC 81 MG tablet Take 1 tablet (81 mg total) by mouth daily. 90 tablet 3   atorvastatin (LIPITOR) 20 MG tablet Take 1 tablet (20 mg total) by mouth daily at 6 PM. 90 tablet 3   cetirizine (ZYRTEC) 10 MG tablet Take 1 tablet (10 mg total) by mouth daily as needed for allergies. 90 tablet 3   diltiazem (CARDIZEM CD) 120 MG 24 hr capsule Take 120 mg by mouth daily.     hydrochlorothiazide (HYDRODIURIL) 25 MG tablet Take 1 tablet (25 mg total) by mouth daily. 90 tablet 3   Ipratropium-Albuterol (COMBIVENT RESPIMAT) 20-100 MCG/ACT AERS respimat Inhale 1 puff into the lungs every 6 (six) hours as needed for wheezing. 4 g 5   levothyroxine (SYNTHROID) 50 MCG tablet Take 1 tablet (50 mcg total) by mouth daily before breakfast. 90 tablet 3   losartan (COZAAR) 50 MG tablet Take 1 tablet (50 mg total) by mouth daily. 90 tablet 1   Omega 3 1000 MG CAPS Take 1,000 mg by mouth daily.      ondansetron (ZOFRAN-ODT) 8 MG disintegrating tablet Take 1 tablet (8 mg total) by mouth every 6  (six) hours as needed for nausea or vomiting. 20 tablet 1   pantoprazole (PROTONIX) 40 MG tablet Take 1 tablet (40 mg total) by mouth daily. 90 tablet 3   potassium chloride SA (KLOR-CON M) 20 MEQ tablet TAKE 1 TABLET BY MOUTH ONCE DAILY. 30 tablet 1   No facility-administered medications prior to visit.     Review of Systems:   Constitutional: No weight loss or gain, night sweats, fevers, chills, fatigue, or lassitude. HEENT: No headaches, difficulty swallowing, tooth/dental problems, or sore throat. No sneezing, itching, ear ache, nasal congestion, or post nasal drip CV:  No chest pain, orthopnea, PND, swelling in lower extremities, anasarca, dizziness, palpitations, syncope Resp: +shortness of breath with strenuous activity (improved); rare cough. No excess mucus or change in color of mucus. No hemoptysis. No wheezing.  No chest wall deformity GI:  No heartburn, indigestion, abdominal pain, nausea, vomiting, diarrhea, change in bowel habits, loss of appetite, bloody stools.  GU: No dysuria, change in color of urine, urgency or frequency.   Skin: No rash, lesions, ulcerations MSK:  No joint pain or swelling.   Neuro: No dizziness or lightheadedness.  Psych: No depression or anxiety. Mood stable.     Physical Exam:  BP (!) 160/82 (BP Location: Left Arm)   Pulse 90   Ht 5\' 1"  (1.549 m)  Wt 134 lb 9.6 oz (61.1 kg)   SpO2 96%   BMI 25.43 kg/m   GEN: Pleasant, interactive, well-appearing; in no acute distress HEENT:  Normocephalic and atraumatic. PERRLA. Sclera white. Nasal turbinates pink, moist and patent bilaterally. No rhinorrhea present. Oropharynx pink and moist, without exudate or edema. No lesions, ulcerations, or postnasal drip.  NECK:  Supple w/ fair ROM. No JVD present. Normal carotid impulses w/o bruits. Thyroid symmetrical with no goiter or nodules palpated. No lymphadenopathy.   CV: RRR, no m/r/g, no peripheral edema. Pulses intact, +2 bilaterally. No cyanosis, pallor  or clubbing. PULMONARY:  Unlabored, regular breathing. Bibasilar crackles bilaterally otherwise clear A&P w/o wheezes/rales/rhonchi. No accessory muscle use.  GI: BS present and normoactive. Soft, non-tender to palpation. No organomegaly or masses detected.  MSK: No erythema, warmth or tenderness. Cap refil <2 sec all extrem. No deformities or joint swelling noted.  Neuro: A/Ox3. No focal deficits noted.   Skin: Warm, no lesions or rashe Psych: Normal affect and behavior. Judgement and thought content appropriate.     Lab Results:  CBC    Component Value Date/Time   WBC 7.6 09/21/2021 1056   WBC 9.1 09/06/2020 1429   RBC 4.29 09/21/2021 1056   RBC 4.25 09/06/2020 1429   HGB 13.3 09/21/2021 1056   HCT 40.0 09/21/2021 1056   PLT 159 09/21/2021 1056   MCV 93 09/21/2021 1056   MCH 31.0 09/21/2021 1056   MCH 30.8 09/06/2020 1429   MCHC 33.3 09/21/2021 1056   MCHC 33.3 09/06/2020 1429   RDW 13.1 09/21/2021 1056   LYMPHSABS 1.2 09/21/2021 1056   MONOABS 1.0 12/07/2019 1155   EOSABS 0.2 09/21/2021 1056   BASOSABS 0.0 09/21/2021 1056    BMET    Component Value Date/Time   NA 143 12/27/2021 1305   K 3.9 12/27/2021 1305   CL 102 12/27/2021 1305   CO2 27 12/27/2021 1305   GLUCOSE 90 12/27/2021 1305   GLUCOSE 102 (H) 01/31/2021 1304   BUN 16 12/27/2021 1305   CREATININE 0.84 12/27/2021 1305   CREATININE 0.70 09/23/2012 1335   CALCIUM 9.4 12/27/2021 1305   GFRNONAA >60 01/31/2021 1304   GFRNONAA >89 09/23/2012 1335   GFRAA 93 03/23/2020 1042   GFRAA >89 09/23/2012 1335    BNP    Component Value Date/Time   BNP 77.7 12/16/2017 1924     Imaging:  No results found.       Latest Ref Rng & Units 02/07/2022    1:39 PM 07/26/2021    9:08 AM 04/27/2021   10:21 AM 12/10/2019   12:24 PM 06/11/2018   10:31 AM  PFT Results  FVC-Pre L 2.04  2.05  1.98  2.13  2.05   FVC-Predicted Pre % 78  78  75  79  74   FVC-Post L    2.18    FVC-Predicted Post %    81    Pre FEV1/FVC %  % 92  91  89  91  92   Post FEV1/FCV % %    92    FEV1-Pre L 1.88  1.86  1.77  1.94  1.88   FEV1-Predicted Pre % 95  94  88  96  90   FEV1-Post L    2.01    DLCO uncorrected ml/min/mmHg 14.38  11.06  10.43   13.22   DLCO UNC% % 82  63  59   74   DLCO corrected ml/min/mmHg 14.38  11.06  10.28  DLCO COR %Predicted % 82  63  58     DLVA Predicted % 114  83  80   95     No results found for: "NITRICOXIDE"      Assessment & Plan:   ILD (interstitial lung disease) (HCC) Progressive phenotype; now behaving like IPF. She was started on antifibrotics with nintedanib in November 2023. Tolerating well. Symptom score is improved today. Walk test without desaturation. She is clinically stable or even slightly improved. Encouraged her to continue activity as tolerated. Recheck CMET today with plans to repeat in 4 weeks. She will have repeat spiro/DLCO upon return.  Patient Instructions  -Continue nintedanib (Ofev) 100 mg Twice daily. Take with food -Ok to try using budesonide inhaler Twice daily as needed for shortness of breath or wheezing. If you notice that you are using it on a consistent basis or your breathing/activity tolerance worsens with this change, go back to using it on a scheduled basis of twice daily -Continue duoneb 3 mL neb every 6 hours as needed for shortness of breath or wheezing.  -Continue cetirizine (zyrtec) 1 tab daily for allergies -Continue pantoprazole 1 tab daily for reflux  Labs today - CMET  Follow up in 8 weeks with Dr. Marchelle Gearing (1st) or Katie Kloee Ballew,NP and repeat spirometry/DLCO beforehand. If symptoms worsen, please contact office for sooner follow up or seek emergency care.     Allergy-induced asthma Possible underlying asthma. She has been on budesonide for management. Stable for quite some time. She would like to try coming off of this. We will de-escalate her to PRN use for SOB/wheezing. She understands that if her breathing worsens or develops new  respiratory symptoms, she may need to restart daily use. Asthma action plan in place.    I spent 35 minutes of dedicated to the care of this patient on the date of this encounter to include pre-visit review of records, face-to-face time with the patient discussing conditions above, post visit ordering of testing, clinical documentation with the electronic health record, making appropriate referrals as documented, and communicating necessary findings to members of the patients care team.  Noemi Chapel, NP 04/19/2022  Pt aware and understands NP's role.

## 2022-04-23 ENCOUNTER — Encounter: Payer: Self-pay | Admitting: Family Medicine

## 2022-04-23 ENCOUNTER — Ambulatory Visit (INDEPENDENT_AMBULATORY_CARE_PROVIDER_SITE_OTHER): Payer: Medicare Other | Admitting: Family Medicine

## 2022-04-23 VITALS — BP 129/74 | HR 91 | Temp 97.6°F | Ht 61.0 in | Wt 134.8 lb

## 2022-04-23 DIAGNOSIS — J329 Chronic sinusitis, unspecified: Secondary | ICD-10-CM | POA: Diagnosis not present

## 2022-04-23 DIAGNOSIS — J069 Acute upper respiratory infection, unspecified: Secondary | ICD-10-CM | POA: Diagnosis not present

## 2022-04-23 DIAGNOSIS — J40 Bronchitis, not specified as acute or chronic: Secondary | ICD-10-CM

## 2022-04-23 MED ORDER — BENZONATATE 200 MG PO CAPS: 200.0000 mg | ORAL_CAPSULE | Freq: Three times a day (TID) | ORAL | 0 refills | Status: DC | PRN

## 2022-04-23 MED ORDER — CEFUROXIME AXETIL 250 MG PO TABS: 250.0000 mg | ORAL_TABLET | Freq: Two times a day (BID) | ORAL | 0 refills | Status: AC

## 2022-04-23 MED ORDER — PSEUDOEPHEDRINE-GUAIFENESIN ER 60-600 MG PO TB12: 1.0000 | ORAL_TABLET | Freq: Two times a day (BID) | ORAL | 0 refills | Status: AC

## 2022-04-23 NOTE — Addendum Note (Signed)
Addended by: Christia Reading on: 04/23/2022 04:48 PM   Modules accepted: Orders

## 2022-04-23 NOTE — Progress Notes (Signed)
Chief Complaint  Patient presents with   Cough   Nasal Congestion   Headache   Sore Throat    HPI  Patient presents today for Patient presents with upper respiratory congestion. Rhinorrhea that is frequently purulent. There is moderate sore throat. Patient reports coughing frequently as well.  yellow sputum noted. There is no fever, chills or sweats. The patient denies being short of breath. Onset was 4 days ago. Gradually worsening. Tried OTCs without improvement.  PMH: Smoking status noted ROS: Per HPI  Objective: BP 129/74   Pulse 91   Temp 97.6 F (36.4 C)   Ht '5\' 1"'$  (1.549 m)   Wt 134 lb 12.8 oz (61.1 kg)   SpO2 93%   BMI 25.47 kg/m  Gen: NAD, alert, cooperative with exam HEENT: NCAT, Nasal passages swollen, red TMS dull, fluid present CV: RRR, good S1/S2, no murmur Resp: Bronchitis changes with scattered wheezes, non-labored Ext: No edema, warm Neuro: Alert and oriented, No gross deficits  Assessment and plan:  1. Sinobronchitis     Meds ordered this encounter  Medications   cefUROXime (CEFTIN) 250 MG tablet    Sig: Take 1 tablet (250 mg total) by mouth 2 (two) times daily with a meal for 10 days.    Dispense:  20 tablet    Refill:  0   benzonatate (TESSALON) 200 MG capsule    Sig: Take 1 capsule (200 mg total) by mouth 3 (three) times daily as needed for cough.    Dispense:  20 capsule    Refill:  0   pseudoephedrine-guaifenesin (MUCINEX D) 60-600 MG 12 hr tablet    Sig: Take 1 tablet by mouth every 12 (twelve) hours for 10 days. As needed for congestion    Dispense:  20 tablet    Refill:  0    No orders of the defined types were placed in this encounter.   Follow up as needed.  Claretta Fraise, MD

## 2022-04-24 ENCOUNTER — Other Ambulatory Visit: Payer: Self-pay | Admitting: Family Medicine

## 2022-04-24 LAB — COVID-19, FLU A+B AND RSV
Influenza A, NAA: NOT DETECTED
Influenza B, NAA: NOT DETECTED
RSV, NAA: NOT DETECTED
SARS-CoV-2, NAA: NOT DETECTED

## 2022-04-24 NOTE — Progress Notes (Signed)
Liver enzymes remain slightly elevated; however, they are improved when compared to previous. Continue Ofev as prescribed. Return for repeat labs in 4 weeks. Thanks.

## 2022-04-27 DIAGNOSIS — R0689 Other abnormalities of breathing: Secondary | ICD-10-CM | POA: Diagnosis not present

## 2022-04-27 DIAGNOSIS — R079 Chest pain, unspecified: Secondary | ICD-10-CM | POA: Diagnosis not present

## 2022-04-27 DIAGNOSIS — I10 Essential (primary) hypertension: Secondary | ICD-10-CM | POA: Diagnosis not present

## 2022-05-14 ENCOUNTER — Other Ambulatory Visit: Payer: Self-pay | Admitting: Family Medicine

## 2022-05-14 DIAGNOSIS — Z1231 Encounter for screening mammogram for malignant neoplasm of breast: Secondary | ICD-10-CM

## 2022-05-15 ENCOUNTER — Telehealth: Payer: Self-pay | Admitting: Internal Medicine

## 2022-05-15 NOTE — Telephone Encounter (Signed)
PT granddaughter calling asking for explanation of blood work results sent in a letter to her in [place]. Please call Grandaughter to review the results to her. She was not sure what all the values meant. Thanks.  [place] Granddaughter [phone]

## 2022-05-17 NOTE — Telephone Encounter (Signed)
Spoke with patient grand daughter. Went over blood work results. Advised Katie wanted pt to come in for repeat cmet. Told grand daughter orders have been placed. She verbalized understanding. Nothing further needed.

## 2022-05-20 ENCOUNTER — Ambulatory Visit
Admission: RE | Admit: 2022-05-20 | Discharge: 2022-05-20 | Disposition: A | Discharge status: Home / Self Care | Payer: Medicare Other | Source: Ambulatory Visit | Attending: Family Medicine | Admitting: Family Medicine

## 2022-05-20 DIAGNOSIS — Z1231 Encounter for screening mammogram for malignant neoplasm of breast: Secondary | ICD-10-CM | POA: Diagnosis not present

## 2022-05-21 ENCOUNTER — Other Ambulatory Visit (INDEPENDENT_AMBULATORY_CARE_PROVIDER_SITE_OTHER): Payer: Medicare Other

## 2022-05-21 DIAGNOSIS — J849 Interstitial pulmonary disease, unspecified: Secondary | ICD-10-CM

## 2022-05-21 LAB — COMPREHENSIVE METABOLIC PANEL
ALT: 55 U/L — ABNORMAL HIGH (ref 0–35)
AST: 48 U/L — ABNORMAL HIGH (ref 0–37)
Albumin: 4.1 g/dL (ref 3.5–5.2)
Alkaline Phosphatase: 48 U/L (ref 39–117)
BUN: 12 mg/dL (ref 6–23)
CO2: 33 mEq/L — ABNORMAL HIGH (ref 19–32)
Calcium: 9.2 mg/dL (ref 8.4–10.5)
Chloride: 99 mEq/L (ref 96–112)
Creatinine, Ser: 0.74 mg/dL (ref 0.40–1.20)
GFR: 81.12 mL/min (ref >60.00)
Glucose, Bld: 132 mg/dL — ABNORMAL HIGH (ref 70–99)
Potassium: 3.1 mEq/L — ABNORMAL LOW (ref 3.5–5.1)
Sodium: 140 mEq/L (ref 135–145)
Total Bilirubin: 0.5 mg/dL (ref 0.2–1.2)
Total Protein: 6.5 g/dL (ref 6.0–8.3)

## 2022-05-22 NOTE — Progress Notes (Signed)
Potassium level is low at 3.1. Please clarify if she is still taking potassium chloride 20 meq daily. If so, please instruct her to take an additional pill (20 meq) for the next 3 days then return to 1 pill afterwards. She will need redraw with BMET in one week. If she is not still taking this, she will need to restart 20 meq daily and discuss monitoring with her PCP. Thanks.

## 2022-05-24 ENCOUNTER — Other Ambulatory Visit: Payer: Self-pay | Admitting: Family Medicine

## 2022-05-24 DIAGNOSIS — I10 Essential (primary) hypertension: Secondary | ICD-10-CM

## 2022-05-24 DIAGNOSIS — E039 Hypothyroidism, unspecified: Secondary | ICD-10-CM

## 2022-05-29 ENCOUNTER — Encounter: Payer: Self-pay | Admitting: *Deleted

## 2022-05-29 NOTE — Progress Notes (Signed)
Called language line for Spanish interpreter.  No answer on patient phone [phone].  No vm set up.  Called [phone], line was picked up and as I was speaking, the woman who picked up the phone hung up.  I called back, no answer and the mail box was full.    Unable to reach letter sent in [place].

## 2022-06-06 NOTE — Telephone Encounter (Signed)
Left message for patient's granddaughter to call back.

## 2022-06-06 NOTE — Telephone Encounter (Signed)
Tasha [name] daughter is returning phone call Tasha [name] phone number is [phone].

## 2022-06-14 ENCOUNTER — Ambulatory Visit (INDEPENDENT_AMBULATORY_CARE_PROVIDER_SITE_OTHER): Payer: Medicare Other | Admitting: Internal Medicine

## 2022-06-14 ENCOUNTER — Ambulatory Visit (INDEPENDENT_AMBULATORY_CARE_PROVIDER_SITE_OTHER): Payer: Medicare Other | Admitting: Nurse Practitioner

## 2022-06-14 ENCOUNTER — Telehealth: Payer: Self-pay | Admitting: Nurse Practitioner

## 2022-06-14 ENCOUNTER — Encounter: Payer: Self-pay | Admitting: Nurse Practitioner

## 2022-06-14 VITALS — BP 142/70 | HR 80 | Temp 98.5°F | Ht 60.5 in | Wt 130.6 lb

## 2022-06-14 DIAGNOSIS — Z5181 Encounter for therapeutic drug level monitoring: Secondary | ICD-10-CM | POA: Diagnosis not present

## 2022-06-14 DIAGNOSIS — E876 Hypokalemia: Secondary | ICD-10-CM

## 2022-06-14 DIAGNOSIS — J4520 Mild intermittent asthma, uncomplicated: Secondary | ICD-10-CM

## 2022-06-14 DIAGNOSIS — J849 Interstitial pulmonary disease, unspecified: Secondary | ICD-10-CM

## 2022-06-14 LAB — PULMONARY FUNCTION TEST
DL/VA % pred: 84 %
DL/VA: 3.56 ml/min/mmHg/L
DLCO cor % pred: 67 %
DLCO cor: 11.51 ml/min/mmHg
DLCO unc % pred: 67 %
DLCO unc: 11.51 ml/min/mmHg
FEF 25-75 Pre: 2.86 L/sec
FEF2575-%Pred-Pre: 172 %
FEV1-%Pred-Pre: 99 %
FEV1-Pre: 1.91 L
FEV1FVC-%Pred-Pre: 118 %
FEV6-%Pred-Pre: 87 %
FEV6-Pre: 2.12 L
FEV6FVC-%Pred-Pre: 105 %
FVC-%Pred-Pre: 83 %
FVC-Pre: 2.12 L
Pre FEV1/FVC ratio: 90 %
Pre FEV6/FVC Ratio: 100 %

## 2022-06-14 LAB — COMPREHENSIVE METABOLIC PANEL
ALT: 65 U/L — ABNORMAL HIGH (ref 0–35)
AST: 54 U/L — ABNORMAL HIGH (ref 0–37)
Albumin: 4.2 g/dL (ref 3.5–5.2)
Alkaline Phosphatase: 54 U/L (ref 39–117)
BUN: 16 mg/dL (ref 6–23)
CO2: 30 mEq/L (ref 19–32)
Calcium: 9.5 mg/dL (ref 8.4–10.5)
Chloride: 101 mEq/L (ref 96–112)
Creatinine, Ser: 1.2 mg/dL (ref 0.40–1.20)
GFR: 45.39 mL/min — ABNORMAL LOW (ref >60.00)
Glucose, Bld: 88 mg/dL (ref 70–99)
Potassium: 2.9 mEq/L — ABNORMAL LOW (ref 3.5–5.1)
Sodium: 141 mEq/L (ref 135–145)
Total Bilirubin: 0.6 mg/dL (ref 0.2–1.2)
Total Protein: 7 g/dL (ref 6.0–8.3)

## 2022-06-14 MED ORDER — POTASSIUM CHLORIDE CRYS ER 20 MEQ PO TBCR: EXTENDED_RELEASE_TABLET | ORAL | 0 refills | Status: DC

## 2022-06-14 NOTE — Telephone Encounter (Signed)
06/14/2022: Patient CMET results with potassium 2.9.  She is currently taking potassium chloride 20 mEq every day.  Spoke with patient's granddaughter, listed as DPR on her chart.  Advised her to have her grandmother take an extra 20 mEq for the next 4 days then return to 1 tablet daily.  Advised her to follow-up with her PCP next week to have labs rechecked and monitor kidney function as GFR has also declined.  Provided with ED precautions.

## 2022-06-14 NOTE — Progress Notes (Signed)
$'@Patient'U$  ID: Tasha [name], female    DOB: [birth year], 72 y.o.   MRN: [id number]  Chief Complaint  Patient presents with   Follow-up    Follow up after PFT.      Referring provider: Dettinger, Fransisca Kaufmann, MD  HPI: 72 year old female, former smoker followed for ILD. She is a patient of Dr. Golden Pop and last seen in office 04/19/2022 by [facility] NP. Past medical history significant for environmental allergies, PAF, HTN, GERD, hypothyroid, prediabetes, HLD.  August 2019, suffered from acute lung injury possibly [place] based on the nature of infiltrates and a high ESR and positive ANA.  She had bronchoscopy with BAL but showed lymphocytosis.  There was some history of antifreeze exposure as well.  Following this, she was diagnosed with ILD.  Over the years, had progressive changes on CT imaging.  Most recently completed October 2023 with concern for UIP.  She was started on Ofev in November 2023.  TEST/EVENTS:  06/11/2018 PFT: FVC 74, FEV1 90, ratio 92, DLCOunc 74 12/07/2019: Positive RAST for various grasses, IgE normal; eos 200 12/10/2019 PFT: FVC 79, FEV1 96, ratio 92.  Unable to complete DLCO.  No BD. 01/26/2020: Autoimmune workup negative aside from positive ANA 1:640 03/08/2021 echo: EF 55 to 60%.  RV size and function normal.  Normal PASP.  Trivial pericardial effusion.  Trivial MR. 04/27/2021 PFT: FVC 75, FEV1 88, ratio 89, DLCOcor 58 07/26/2021 PFT: FVC 78, FEV1 94, ratio 91, DLCO 63 01/26/2022 HRCT chest: Atherosclerosis.  No LAD.  Progressive interstitial lung disease; probable UIP. 02/07/2022 PFT: FVC 78, FEV1 95, ratio 92, DLCO 82  02/07/2022: OV with Dr. Chase Caller.  Symptom scores and PFTs have been stable.  However, high-resolution CT chest showed significant worsening and probable UIP pattern.  She was diagnosed with progressive phenotype, behaving like IPF.  Reviewed risk and benefits of starting antifibrotic therapy.  Shared decision to move forward with Ofev.  04/19/2022: OV with  Janyce Ellinger NP for follow-up.  She actually came to see me around 4 weeks ago but she had yet to start on Ofev therapy and given her stability, we postpone her appointment.  Since then, she has started on her antifibrotic's; believes the date was around 11/30.  She has been tolerating these well.  Has not noticed any significant side effects.  Overall, she feels like her breathing is stable.  She actually feels like it has been a little bit better over the last few months.  She feels like she has been trying to work on her exercise and activity status.  She was able to go shopping over Christmas and did not have any difficulty walking through the stores.  Her cough is very minimal, almost nonexistent.  She denies any wheezing, chest congestion, lower extremity swelling, orthopnea.  She is using budesonide inhaler twice daily.  Is not sure if this is made much of a difference for her.  She would like to see if she does okay using it less often.  Never uses her nebulizer treatments      Simple office walk 185 feet x  3 laps goal with forehead probe 02/19/2018   03/31/2018   06/11/2018   12/07/2019   01/25/2020   10/26/2020   04/27/2021   04/19/2022  O2 used rooom air Room air Room air ra ra ra ra ra  Number laps completed 3 3 x 250 feet 3 x 236 feet 3 laops '3 3 3 3  '$ Comments about pace Moderate pace Mod  pace Normal pace avg pace   Avg pace avg avg  Resting Pulse Ox/HR 99% and 71/min 100% and 89/min 100% and 83/mn 100% and 83/min 99% and 99/min 99% and 83 98% and 83 98% and 80  Final Pulse Ox/HR 95% and 92/min 98% and 111 98% and 101/min 98% and 101/min 98% and 107/min 97% and 99 97% and 101 98% and 92  Desaturated </= 88% no no no no no non no no  Desaturated <= 3% points yes no no no none no no no  Got Tachycardic >/= 90/min yes yes yes yes yes yes yes no  Symptoms at end of test none none none Mild dyspnea none noe Mild dyspnea none  Miscellaneous comments x x x            06/14/2022: Today - follow  up Patient presents today for follow up. She had PFTs today. Spirometry was stable. DLCO was declined when compared to 4 months ago but improved compared to a year ago. She feels stable to how she was last time she was here. She doesn't feel like she has much trouble with her breathing. She's able to go shopping now without any troubles. She has a rare, dry cough. She has been tolerating Ofev well. No GI side effects. She is curious if she has any food limitations on this. Otherwise, no concerns or complaints today.   SYMPTOM SCALE - ILD 06/11/2018   12/07/2019   01/25/2020 Last Weight  Most recent update: 01/25/2020  4:14 PM      Weight  65.1 kg (143 lb 9.6 oz)                     7/7/2022145# 04/27/2021   07/26/2021 Pulmonary rehabilitation at [facility] since February 2023  -filled by interpreter Verdis Frederickson 02/07/2022   04/19/2022 06/14/2022  O2 use ra ra ra ra ra ra ra ra ra  Shortness of Breath 0 -> 5 scale with 5 being worst (score 6 If unable to do)           0    At rest 0 0 0 0 0 2 0 0 0  Simple tasks - showers, clothes change, eating, shaving 0 3 0 0 0 2 0 0 0  Household (dishes, doing bed, laundry) 0 3 3 03 4 3 02 0 0  Shopping '1 4 3 '$ 03 0 1 0 0 0  Walking level at own pace 0 4 0 0 0 1 0 0 0  Walking stairs 0 '6 4 4 2 3 4 1 1  '$ Total (40 - 48) Dyspnea Score '1 20 10 10 6 12 6 1 1  '$ How bad is your cough? 0 '5 3 4 4 2 1 1 1  '$ How bad is your fatigue '3 4 4 3 3 3 '$ 0 0 0  nausea   0 0 0 0 0 0 0 0  vomit   0 0 0 0 0 0 0 0  diarrhea   0 0 0 0 0 0 0 0  anzity   '3 3 3 '$ 0 00 0  0 0  Dep[resssio   meds 0 0 0 0 0 0 0    Allergies  Allergen Reactions   Hydromet [Hydrocodone Bit-Homatrop Mbr]     Pt doesn't remember   Toprol Xl [Metoprolol] Hives    Facial rash, itching, hives   Amoxicillin Rash    Did it involve swelling of the face/tongue/throat, SOB, or low  BP? No Did it involve sudden or severe rash/hives, skin peeling, or any reaction on the inside of your mouth or nose? Unknown Did you need  to seek medical attention at a hospital or doctor's office? Unknown When did it last happen?      10 + years If all above answers are "NO", may proceed with cephalosporin use.     Immunization History  Administered Date(s) Administered   Fluad Quad(high Dose 65+) 03/01/2019, 01/14/2020, 01/16/2021, 02/07/2022   Influenza, High Dose Seasonal PF 03/11/2017, 03/09/2018   Influenza,inj,Quad PF,6+ Mos 02/04/2014, 02/09/2015, 02/20/2016   Moderna Sars-Covid-2 Vaccination 05/21/2019, 06/18/2019   Pneumococcal Conjugate-13 08/10/2015   Pneumococcal Polysaccharide-23 06/17/2017, 12/21/2017   Tdap 12/31/2021   Zoster Recombinat (Shingrix) 09/21/2020, 06/21/2021    Past Medical History:  Diagnosis Date   Acute respiratory failure ([place])    CAP (community acquired pneumonia)    Hyperlipidemia    Hypertension    Metabolic syndrome     Tobacco History: Social History   Tobacco Use  Smoking Status Never   Passive exposure: Past  Smokeless Tobacco Never   Counseling given: Not Answered   Outpatient Medications Prior to Visit  Medication Sig Dispense Refill   acetaminophen (TYLENOL) 500 MG tablet Take 1 tablet (500 mg total) by mouth every 6 (six) hours as needed. 30 tablet 0   aspirin EC 81 MG tablet Take 1 tablet (81 mg total) by mouth daily. 90 tablet 3   atorvastatin (LIPITOR) 20 MG tablet Take 1 tablet (20 mg total) by mouth daily at 6 PM. 90 tablet 3   budesonide (PULMICORT) 180 MCG/ACT inhaler Inhale 2 puffs into the lungs in the morning and at bedtime. 1 each 5   cetirizine (ZYRTEC) 10 MG tablet Take 1 tablet (10 mg total) by mouth daily as needed for allergies. 90 tablet 3   diltiazem (CARDIZEM CD) 120 MG 24 hr capsule TAKE 1 CAPSULE BY MOUTH ONCE DAILY. 30 capsule 4   hydrochlorothiazide (HYDRODIURIL) 25 MG tablet TAKE 1 TABLET ONCE DAILY. 30 tablet 4   ibuprofen (ADVIL) 200 MG tablet Take 400 mg by mouth every 6 (six) hours as needed for headache or moderate pain.      levothyroxine (SYNTHROID) 50 MCG tablet TAKE 1 TABLET BY MOUTH DAILY BEFORE BREAKFAST. 30 tablet 7   losartan (COZAAR) 50 MG tablet Take 1 tablet (50 mg total) by mouth daily. 90 tablet 1   Nintedanib (OFEV) 100 MG CAPS Take 1 capsule (100 mg total) by mouth 2 (two) times daily. 180 capsule 1   Omega 3 1000 MG CAPS Take 1,000 mg by mouth daily.      ondansetron (ZOFRAN-ODT) 8 MG disintegrating tablet Take 1 tablet (8 mg total) by mouth every 6 (six) hours as needed for nausea or vomiting. 20 tablet 1   pantoprazole (PROTONIX) 40 MG tablet Take 1 tablet (40 mg total) by mouth daily. 90 tablet 3   potassium chloride SA (KLOR-CON M) 20 MEQ tablet TAKE 1 TABLET BY MOUTH ONCE DAILY. 30 tablet 1   benzonatate (TESSALON) 200 MG capsule Take 1 capsule (200 mg total) by mouth 3 (three) times daily as needed for cough. 20 capsule 0   Ipratropium-Albuterol (COMBIVENT RESPIMAT) 20-100 MCG/ACT AERS respimat Inhale 1 puff into the lungs every 6 (six) hours as needed for wheezing. 4 g 5   No facility-administered medications prior to visit.     Review of Systems:   Constitutional: No weight loss or gain, night sweats, fevers, chills, fatigue,  or lassitude. HEENT: No headaches, difficulty swallowing, tooth/dental problems, or sore throat. No sneezing, itching, ear ache, nasal congestion, or post nasal drip CV:  No chest pain, orthopnea, PND, swelling in lower extremities, anasarca, dizziness, palpitations, syncope Resp: +shortness of breath with strenuous activity (improved); rare cough. No excess mucus or change in color of mucus. No hemoptysis. No wheezing.  No chest wall deformity GI:  No heartburn, indigestion, abdominal pain, nausea, vomiting, diarrhea, change in bowel habits, loss of appetite, bloody stools.  GU: No dysuria, change in color of urine, urgency or frequency.   Skin: No rash, lesions, ulcerations MSK:  No joint pain or swelling.   Neuro: No dizziness or lightheadedness.  Psych: No  depression or anxiety. Mood stable.     Physical Exam:  BP (!) 142/70 (BP Location: Right Arm, Patient Position: Sitting, Cuff Size: Normal)   Pulse 80   Temp 98.5 F (36.9 C) (Oral)   Ht 5' 0.5" (1.537 m)   Wt 130 lb 9.6 oz (59.2 kg)   SpO2 96%   BMI 25.09 kg/m   GEN: Pleasant, interactive, well-appearing; in no acute distress HEENT:  Normocephalic and atraumatic. PERRLA. Sclera white. Nasal turbinates pink, moist and patent bilaterally. No rhinorrhea present. Oropharynx pink and moist, without exudate or edema. No lesions, ulcerations, or postnasal drip.  NECK:  Supple w/ fair ROM. No JVD present. Normal carotid impulses w/o bruits. Thyroid symmetrical with no goiter or nodules palpated. No lymphadenopathy.   CV: RRR, no m/r/g, no peripheral edema. Pulses intact, +2 bilaterally. No cyanosis, pallor or clubbing. PULMONARY:  Unlabored, regular breathing. Bibasilar crackles bilaterally otherwise clear A&P w/o wheezes/rales/rhonchi. No accessory muscle use.  GI: BS present and normoactive. Soft, non-tender to palpation. No organomegaly or masses detected.  MSK: No erythema, warmth or tenderness. Cap refil <2 sec all extrem. No deformities or joint swelling noted.  Neuro: A/Ox3. No focal deficits noted.   Skin: Warm, no lesions or rashe Psych: Normal affect and behavior. Judgement and thought content appropriate.     Lab Results:  CBC    Component Value Date/Time   WBC 7.6 09/21/2021 1056   WBC 9.1 09/06/2020 1429   RBC 4.29 09/21/2021 1056   RBC 4.25 09/06/2020 1429   HGB 13.3 09/21/2021 1056   HCT 40.0 09/21/2021 1056   PLT 159 09/21/2021 1056   MCV 93 09/21/2021 1056   MCH 31.0 09/21/2021 1056   MCH 30.8 09/06/2020 1429   MCHC 33.3 09/21/2021 1056   MCHC 33.3 09/06/2020 1429   RDW 13.1 09/21/2021 1056   LYMPHSABS 1.2 09/21/2021 1056   MONOABS 1.0 12/07/2019 1155   EOSABS 0.2 09/21/2021 1056   BASOSABS 0.0 09/21/2021 1056    BMET    Component Value Date/Time    NA 140 05/21/2022 0900   NA 143 12/27/2021 1305   K 3.1 (L) 05/21/2022 0900   CL 99 05/21/2022 0900   CO2 33 (H) 05/21/2022 0900   GLUCOSE 132 (H) 05/21/2022 0900   BUN 12 05/21/2022 0900   BUN 16 12/27/2021 1305   CREATININE 0.74 05/21/2022 0900   CREATININE 0.70 09/23/2012 1335   CALCIUM 9.2 05/21/2022 0900   GFRNONAA >60 01/31/2021 1304   GFRNONAA >89 09/23/2012 1335   GFRAA 93 03/23/2020 1042   GFRAA >89 09/23/2012 1335    BNP    Component Value Date/Time   BNP 77.7 12/16/2017 1924     Imaging:  MM 3D SCREEN BREAST BILATERAL  Result Date: 05/22/2022 CLINICAL DATA:  Screening.  EXAM: DIGITAL SCREENING BILATERAL MAMMOGRAM WITH TOMOSYNTHESIS AND CAD TECHNIQUE: Bilateral screening digital craniocaudal and mediolateral oblique mammograms were obtained. Bilateral screening digital breast tomosynthesis was performed. The images were evaluated with computer-aided detection. COMPARISON:  Previous exam(s). ACR Breast Density Category c: The breast tissue is heterogeneously dense, which may obscure small masses. FINDINGS: There are no findings suspicious for malignancy. IMPRESSION: No mammographic evidence of malignancy. A result letter of this screening mammogram will be mailed directly to the patient. RECOMMENDATION: Screening mammogram in one year. (Code:SM-B-01Y) BI-RADS CATEGORY  1: Negative. Electronically Signed   By: Kristopher Oppenheim M.D.   On: 05/22/2022 11:21         Latest Ref Rng & Units 06/14/2022    9:49 AM 02/07/2022    1:39 PM 07/26/2021    9:08 AM 04/27/2021   10:21 AM 12/10/2019   12:24 PM 06/11/2018   10:31 AM  PFT Results  FVC-Pre L 2.12  P 2.04  2.05  1.98  2.13  2.05   FVC-Predicted Pre % 83  P 78  78  75  79  74   FVC-Post L     2.18    FVC-Predicted Post %     81    Pre FEV1/FVC % % 90  P 92  91  89  91  92   Post FEV1/FCV % %     92    FEV1-Pre L 1.91  P 1.88  1.86  1.77  1.94  1.88   FEV1-Predicted Pre % 99  P 95  94  88  96  90   FEV1-Post L     2.01     DLCO uncorrected ml/min/mmHg 11.51  P 14.38  11.06  10.43   13.22   DLCO UNC% % 67  P 82  63  59   74   DLCO corrected ml/min/mmHg 11.51  P 14.38  11.06  10.28     DLCO COR %Predicted % 67  P 82  63  58     DLVA Predicted % 84  P 114  83  80   95     P Preliminary result    No results found for: "NITRICOXIDE"      Assessment & Plan:   ILD (interstitial lung disease) (HCC) Progressive phenotype; behaving like IPF. She was started on antifibrotics with nintedanib in November 2023. Tolerating well. She had PFTs today with stable spirometry, decline in DLCO compared to 4 months ago but stable to improved when compared to 10 months to a year ago. She is clinically improved over the past 6 months. Encouraged her to continue activity as tolerated. Recheck CMET today for medication monitoring. She will have repeat spiro/DLCO and walk test upon return.  Patient Instructions  -Continue nintedanib (Ofev) 100 mg Twice daily. Take with food -Ok to try using budesonide inhaler Twice daily as needed for shortness of breath or wheezing. If you notice that you are using it on a consistent basis or your breathing/activity tolerance worsens with this change, go back to using it on a scheduled basis of twice daily -Continue duoneb 3 mL neb every 6 hours as needed for shortness of breath or wheezing.  -Continue cetirizine (zyrtec) 1 tab daily for allergies -Continue pantoprazole 1 tab daily for reflux   Labs today - CMET   Follow up in 12 weeks with Dr. Chase Caller and repeat spirometry/DLCO beforehand. If symptoms worsen, please contact office for sooner follow up or seek emergency care.  Allergy-induced asthma Compensated on regimen of PRN ICS and SABA. Rare use. No recent exacerbations requiring steroids or abx. Action plan in place.    I spent 28 minutes of dedicated to the care of this patient on the date of this encounter to include pre-visit review of records, face-to-face time with the  patient discussing conditions above, post visit ordering of testing, clinical documentation with the electronic health record, making appropriate referrals as documented, and communicating necessary findings to members of the patients care team.  Clayton Bibles, NP 06/14/2022  Pt aware and understands NP's role.

## 2022-06-14 NOTE — Progress Notes (Signed)
Spirometry & DLCO Performed Today.

## 2022-06-14 NOTE — Patient Instructions (Signed)
Spirometry & DLCO Performed Today.

## 2022-06-14 NOTE — Assessment & Plan Note (Signed)
Progressive phenotype; behaving like IPF. She was started on antifibrotics with nintedanib in November 2023. Tolerating well. She had PFTs today with stable spirometry, decline in DLCO compared to 4 months ago but stable to improved when compared to 10 months to a year ago. She is clinically improved over the past 6 months. Encouraged her to continue activity as tolerated. Recheck CMET today for medication monitoring. She will have repeat spiro/DLCO and walk test upon return.  Patient Instructions  -Continue nintedanib (Ofev) 100 mg Twice daily. Take with food -Ok to try using budesonide inhaler Twice daily as needed for shortness of breath or wheezing. If you notice that you are using it on a consistent basis or your breathing/activity tolerance worsens with this change, go back to using it on a scheduled basis of twice daily -Continue duoneb 3 mL neb every 6 hours as needed for shortness of breath or wheezing.  -Continue cetirizine (zyrtec) 1 tab daily for allergies -Continue pantoprazole 1 tab daily for reflux   Labs today - CMET   Follow up in 12 weeks with Dr. Chase Caller and repeat spirometry/DLCO beforehand. If symptoms worsen, please contact office for sooner follow up or seek emergency care.

## 2022-06-14 NOTE — Assessment & Plan Note (Signed)
Compensated on regimen of PRN ICS and SABA. Rare use. No recent exacerbations requiring steroids or abx. Action plan in place.

## 2022-06-14 NOTE — Patient Instructions (Signed)
-  Continue nintedanib (Ofev) 100 mg Twice daily. Take with food -Ok to try using budesonide inhaler Twice daily as needed for shortness of breath or wheezing. If you notice that you are using it on a consistent basis or your breathing/activity tolerance worsens with this change, go back to using it on a scheduled basis of twice daily -Continue duoneb 3 mL neb every 6 hours as needed for shortness of breath or wheezing.  -Continue cetirizine (zyrtec) 1 tab daily for allergies -Continue pantoprazole 1 tab daily for reflux   Labs today - CMET   Follow up in 12 weeks with Dr. Chase Caller and repeat spirometry/DLCO beforehand. If symptoms worsen, please contact office for sooner follow up or seek emergency care.

## 2022-06-15 ENCOUNTER — Other Ambulatory Visit: Payer: Self-pay | Admitting: Family Medicine

## 2022-06-17 ENCOUNTER — Other Ambulatory Visit: Payer: Medicare Other

## 2022-06-20 ENCOUNTER — Encounter: Payer: Self-pay | Admitting: Family Medicine

## 2022-06-20 ENCOUNTER — Ambulatory Visit (INDEPENDENT_AMBULATORY_CARE_PROVIDER_SITE_OTHER): Payer: Medicare Other | Admitting: Family Medicine

## 2022-06-20 ENCOUNTER — Ambulatory Visit (HOSPITAL_COMMUNITY)
Admission: RE | Admit: 2022-06-20 | Discharge: 2022-06-20 | Disposition: A | Discharge status: Home / Self Care | Payer: Medicare Other | Source: Ambulatory Visit | Attending: Family Medicine | Admitting: Family Medicine

## 2022-06-20 VITALS — BP 122/69 | HR 81 | Ht 60.5 in | Wt 131.0 lb

## 2022-06-20 DIAGNOSIS — E876 Hypokalemia: Secondary | ICD-10-CM

## 2022-06-20 DIAGNOSIS — J84112 Idiopathic pulmonary fibrosis: Secondary | ICD-10-CM | POA: Diagnosis not present

## 2022-06-20 DIAGNOSIS — K828 Other specified diseases of gallbladder: Secondary | ICD-10-CM | POA: Diagnosis not present

## 2022-06-20 DIAGNOSIS — R1011 Right upper quadrant pain: Secondary | ICD-10-CM | POA: Insufficient documentation

## 2022-06-20 NOTE — Progress Notes (Signed)
BP 122/69   Pulse 81   Ht 5' 0.5" (1.537 m)   Wt 131 lb (59.4 kg)   SpO2 98%   BMI 25.16 kg/m    Subjective:   [name], female    DOB: 09-06-1950, 72 y.o.   MRN: [id number]  HPI: Tasha [name] is a 72 y.o. female presenting on 06/20/2022 for Interstitial Lung Disease (Seen pulmo, f/u from that visit)   HPI Right upper quadrant abdominal pain and postprandial indigestion. Patient comes in today complaining of upper abdominal pain and indigestion and some periumbilical pain as well.  She says the pain is much worse after she eats anything as older fats.  If she avoids those things it does get better.  She feels like the pain is a crampy pain that goes all the way across her upper abdomen at times and then down both sides at times but usually mostly in the upper abdomen.  She said it started a couple weeks ago and has been off and on going.  Patient had hyperkalemia when she is up to neurology, they told her to double up For Few Days and Come See [place].  Relevant past medical, surgical, family and social history reviewed and updated as indicated. Interim medical history since our last visit reviewed. Allergies and medications reviewed and updated.  Review of Systems  Constitutional:  Negative for chills and fever.  HENT:  Negative for congestion, ear discharge and ear pain.   Eyes:  Negative for visual disturbance.  Respiratory:  Negative for chest tightness and shortness of breath.   Cardiovascular:  Negative for chest pain and leg swelling.  Gastrointestinal:  Positive for abdominal pain, constipation, diarrhea and nausea. Negative for abdominal distention and vomiting.  Musculoskeletal:  Negative for back pain and gait problem.  Skin:  Negative for rash.  Neurological:  Negative for light-headedness and headaches.  Psychiatric/Behavioral:  Negative for agitation and behavioral problems.   All other systems reviewed and are negative.   Per HPI unless  specifically indicated above   Allergies as of 06/20/2022       Reactions   Hydromet [hydrocodone Bit-homatrop Mbr]    Pt doesn't remember   Toprol Xl [metoprolol] Hives   Facial rash, itching, hives   Amoxicillin Rash   Did it involve swelling of the face/tongue/throat, SOB, or low BP? No Did it involve sudden or severe rash/hives, skin peeling, or any reaction on the inside of your mouth or nose? Unknown Did you need to seek medical attention at a hospital or doctor's office? Unknown When did it last happen?      10 + years If all above answers are "NO", may proceed with cephalosporin use.        Medication List        Accurate as of June 20, 2022  1:30 PM. If you have any questions, ask your nurse or doctor.          acetaminophen 500 MG tablet Commonly known as: TYLENOL Take 1 tablet (500 mg total) by mouth every 6 (six) hours as needed.   aspirin EC 81 MG tablet Take 1 tablet (81 mg total) by mouth daily.   atorvastatin 20 MG tablet Commonly known as: LIPITOR Take 1 tablet (20 mg total) by mouth daily at 6 PM.   budesonide 180 MCG/ACT inhaler Commonly known as: PULMICORT Inhale 2 puffs into the lungs in the morning and at bedtime.   cetirizine 10 MG tablet Commonly known as: ZYRTEC  Take 1 tablet (10 mg total) by mouth daily as needed for allergies.   diltiazem 120 MG 24 hr capsule Commonly known as: CARDIZEM CD TAKE 1 CAPSULE BY MOUTH ONCE DAILY.   hydrochlorothiazide 25 MG tablet Commonly known as: HYDRODIURIL TAKE 1 TABLET ONCE DAILY.   ibuprofen 200 MG tablet Commonly known as: ADVIL Take 400 mg by mouth every 6 (six) hours as needed for headache or moderate pain.   ipratropium-albuterol 0.5-2.5 (3) MG/3ML Soln Commonly known as: DUONEB Take 3 mLs by nebulization every 6 (six) hours as needed.   levothyroxine 50 MCG tablet Commonly known as: SYNTHROID TAKE 1 TABLET BY MOUTH DAILY BEFORE BREAKFAST.   losartan 50 MG tablet Commonly known  as: COZAAR Take 1 tablet (50 mg total) by mouth daily.   Ofev 100 MG Caps Generic drug: Nintedanib Take 1 capsule (100 mg total) by mouth 2 (two) times daily.   Omega 3 1000 MG Caps Take 1,000 mg by mouth daily.   ondansetron 8 MG disintegrating tablet Commonly known as: ZOFRAN-ODT Take 1 tablet (8 mg total) by mouth every 6 (six) hours as needed for nausea or vomiting.   pantoprazole 40 MG tablet Commonly known as: PROTONIX Take 1 tablet (40 mg total) by mouth daily.   potassium chloride SA 20 MEQ tablet Commonly known as: KLOR-CON M TAKE 1 TABLET BY MOUTH ONCE DAILY.         Objective:   BP 122/69   Pulse 81   Ht 5' 0.5" (1.537 m)   Wt 131 lb (59.4 kg)   SpO2 98%   BMI 25.16 kg/m   Wt Readings from Last 3 Encounters:  06/20/22 131 lb (59.4 kg)  06/14/22 130 lb 9.6 oz (59.2 kg)  04/23/22 134 lb 12.8 oz (61.1 kg)    Physical Exam Vitals and nursing note reviewed.  Constitutional:      General: She is not in acute distress.    Appearance: She is well-developed. She is not diaphoretic.  Eyes:     Conjunctiva/sclera: Conjunctivae normal.  Cardiovascular:     Rate and Rhythm: Normal rate and regular rhythm.     Heart sounds: Normal heart sounds. No murmur heard. Pulmonary:     Effort: Pulmonary effort is normal. No respiratory distress.     Breath sounds: Normal breath sounds. No wheezing.  Abdominal:     General: Abdomen is flat. Bowel sounds are normal. There is no distension.     Tenderness: There is abdominal tenderness. There is no guarding or rebound.  Musculoskeletal:        General: No tenderness. Normal range of motion.  Skin:    General: Skin is warm and dry.     Findings: No rash.  Neurological:     Mental Status: She is alert and oriented to person, place, and time.     Coordination: Coordination normal.  Psychiatric:        Behavior: Behavior normal.       Assessment & Plan:   Problem List Items Addressed This Visit   None Visit  Diagnoses     Idiopathic pulmonary fibrosis ([place])    -  Primary   Relevant Medications   ipratropium-albuterol (DUONEB) 0.5-2.5 (3) MG/3ML SOLN   Hypokalemia       Relevant Orders   [id number]   Colicky RUQ abdominal pain       Relevant Orders   US Abdomen Limited RUQ (LIVER/GB)       Will order stat right upper quadrant  ultrasound, concern for cholecystitis and recheck her potassium today.  No other changes. Follow up plan: Return if symptoms worsen or fail to improve, for 1 to 67-monthrecheck abdominal pain routine labs..  Counseling provided for all of the vaccine components Orders Placed This Encounter  Procedures   [place]domen Limited RUQ (LIVER/GB)   BMP8+EGFR    JCaryl Pina MD [facility] 06/20/2022, 1:30 PM

## 2022-06-21 LAB — BMP8+EGFR
BUN/Creatinine Ratio: 13 (ref 12–28)
BUN: 11 mg/dL (ref 8–27)
CO2: 23 mmol/L (ref 20–29)
Calcium: 9.1 mg/dL (ref 8.7–10.3)
Chloride: 103 mmol/L (ref 96–106)
Creatinine, Ser: 0.82 mg/dL (ref 0.57–1.00)
Glucose: 121 mg/dL — ABNORMAL HIGH (ref 70–99)
Potassium: 3.5 mmol/L (ref 3.5–5.2)
Sodium: 144 mmol/L (ref 134–144)
eGFR: 76 mL/min/{1.73_m2} (ref >59)

## 2022-06-24 ENCOUNTER — Other Ambulatory Visit: Payer: Self-pay | Admitting: Physician Assistant

## 2022-06-24 ENCOUNTER — Other Ambulatory Visit: Payer: Self-pay | Admitting: Family Medicine

## 2022-06-24 DIAGNOSIS — E785 Hyperlipidemia, unspecified: Secondary | ICD-10-CM

## 2022-06-24 DIAGNOSIS — R7303 Prediabetes: Secondary | ICD-10-CM

## 2022-06-25 ENCOUNTER — Other Ambulatory Visit: Payer: Self-pay

## 2022-06-25 DIAGNOSIS — K819 Cholecystitis, unspecified: Secondary | ICD-10-CM

## 2022-06-28 ENCOUNTER — Ambulatory Visit: Payer: Medicare Other | Admitting: Family Medicine

## 2022-07-02 ENCOUNTER — Telehealth: Payer: Self-pay | Admitting: Family Medicine

## 2022-07-02 NOTE — Telephone Encounter (Signed)
Patient was seen in February. Will close encounter.

## 2022-07-02 NOTE — Telephone Encounter (Signed)
Called patient to schedule Medicare Annual Wellness Visit (AWV). No voicemail available to leave a message.  Last date of AWV: 09/22/2020   Please schedule an appointment at any time with either Mickel Baas or [place], [facility]. .  If any questions, please contact me at [phone].  Thank you,  Colletta Maryland,  [place] Program Direct Dial ??[phone]

## 2022-07-02 NOTE — Telephone Encounter (Signed)
Re routing to Triage for 2nd attemp to call.

## 2022-07-03 ENCOUNTER — Telehealth: Payer: Self-pay

## 2022-07-03 NOTE — Telephone Encounter (Signed)
It does not look like they ever contacted it but she does have gallstones and I wanted her to go see the surgeon.  A referral was placed but it looks like they tried to call her without being able to contact her.  I do think this is related to her gallbladder and that she should see the surgeon.  In the meantime she can take over-the-counter Imodium to help with the diarrhea.  Given the number to call the surgeons office.  If it worsens come back and see [place].

## 2022-07-03 NOTE — Telephone Encounter (Signed)
Son calling for patient.  She wants to let us know she has an appointment scheduled with the gastroenterologist on 07/09/22 but her abdominal pain has worsened and she is now experiencing diarrhea.  She would like to know if there is something you can send in for the abdominal pain and diarrhea.  Her pharmacy is [facility] in [place].

## 2022-07-04 NOTE — Telephone Encounter (Signed)
Made patients son aware of Dr Neldon Mc advise. Son voiced understanding. Son advised to call back in 48 hrs if symptoms worsen.

## 2022-07-09 ENCOUNTER — Ambulatory Visit (INDEPENDENT_AMBULATORY_CARE_PROVIDER_SITE_OTHER): Payer: Medicare Other | Admitting: General Surgery

## 2022-07-09 ENCOUNTER — Encounter: Payer: Self-pay | Admitting: General Surgery

## 2022-07-09 VITALS — BP 133/78 | HR 87 | Temp 98.3°F | Resp 12 | Ht 60.05 in | Wt 129.0 lb

## 2022-07-09 DIAGNOSIS — R1011 Right upper quadrant pain: Secondary | ICD-10-CM

## 2022-07-09 NOTE — Patient Instructions (Addendum)
Will get a HIDA scan to look at how your gallbladder works and then she you back.  We are not sure if this is your gallbladder acting up or if you had a viral illness.   Le harn una exploracin HIDA para observar cmo funciona su vescula biliar y luego regresar. No estamos seguros de si se trata de su vescula biliar funcionando mal o si tuvo una enfermedad viral.

## 2022-07-09 NOTE — Progress Notes (Signed)
[facility] History and Physical  Reason for Referral:*** Referring Physician: ***  Chief Complaint   New Patient (Initial Visit)     Tasha [name] is a 72 y.o. female.  HPI: ***.  The *** started *** and has had a duration of ***.  It is associated with ***.  The *** is improved with ***, and is made worse with ***.    Quality*** Context***  Past Medical History:  Diagnosis Date  . Acute respiratory failure ([place])   . CAP (community acquired pneumonia)   . Hyperlipidemia   . Hypertension   . Metabolic syndrome     Past Surgical History:  Procedure Laterality Date  . CESAREAN SECTION    . COLONOSCOPY N/A 06/07/2019   Procedure: COLONOSCOPY;  Surgeon: Danie Binder, MD;  Location: [facility];  Service: Endoscopy;  Laterality: N/A;  12:45  . POLYPECTOMY  06/07/2019   Procedure: POLYPECTOMY;  Surgeon: Danie Binder, MD;  Location: [facility];  Service: Endoscopy;;    Family History  Problem Relation Age of Onset  . Cancer Father   . Healthy Brother   . Healthy Daughter   . Healthy Sister   . Memory loss Sister   . Healthy Son   . Healthy Son   . Breast cancer Niece     Social History   Tobacco Use  . Smoking status: Never    Passive exposure: Past  . Smokeless tobacco: Never  Vaping Use  . Vaping Use: Never used  Substance Use Topics  . Alcohol use: No  . Drug use: No    Medications: {medication reviewed/display:3041432} Allergies as of 07/09/2022       Reactions   Hydromet [hydrocodone Bit-homatrop Mbr]    Pt doesn't remember   Toprol Xl [metoprolol] Hives   Facial rash, itching, hives   Amoxicillin Rash   Did it involve swelling of the face/tongue/throat, SOB, or low BP? No Did it involve sudden or severe rash/hives, skin peeling, or any reaction on the inside of your mouth or nose? Unknown Did you need to seek medical attention at a hospital or doctor's office? Unknown When did it last happen?      10 + years If all  above answers are "NO", may proceed with cephalosporin use.        Medication List        Accurate as of July 09, 2022  9:25 AM. If you have any questions, ask your nurse or doctor.          acetaminophen 500 MG tablet Commonly known as: TYLENOL Take 1 tablet (500 mg total) by mouth every 6 (six) hours as needed.   aspirin EC 81 MG tablet Take 1 tablet (81 mg total) by mouth daily.   atorvastatin 20 MG tablet Commonly known as: LIPITOR TAKE 1 TABLET BY MOUTH DAILY AT 6 PM.   budesonide 180 MCG/ACT inhaler Commonly known as: PULMICORT Inhale 2 puffs into the lungs in the morning and at bedtime.   cetirizine 10 MG tablet Commonly known as: ZYRTEC Take 1 tablet (10 mg total) by mouth daily as needed for allergies.   diltiazem 120 MG 24 hr capsule Commonly known as: CARDIZEM CD TAKE 1 CAPSULE BY MOUTH ONCE DAILY.   hydrochlorothiazide 25 MG tablet Commonly known as: HYDRODIURIL TAKE 1 TABLET ONCE DAILY.   ibuprofen 200 MG tablet Commonly known as: ADVIL Take 400 mg by mouth every 6 (six) hours as needed for headache or moderate pain.  ipratropium-albuterol 0.5-2.5 (3) MG/3ML Soln Commonly known as: DUONEB Take 3 mLs by nebulization every 6 (six) hours as needed.   levothyroxine 50 MCG tablet Commonly known as: SYNTHROID TAKE 1 TABLET BY MOUTH DAILY BEFORE BREAKFAST.   losartan 50 MG tablet Commonly known as: COZAAR TAKE 1 TABLET ONCE DAILY.   Ofev 100 MG Caps Generic drug: Nintedanib Take 1 capsule (100 mg total) by mouth 2 (two) times daily.   Omega 3 1000 MG Caps Take 1,000 mg by mouth daily.   ondansetron 8 MG disintegrating tablet Commonly known as: ZOFRAN-ODT Take 1 tablet (8 mg total) by mouth every 6 (six) hours as needed for nausea or vomiting.   pantoprazole 40 MG tablet Commonly known as: PROTONIX Take 1 tablet (40 mg total) by mouth daily.   potassium chloride SA 20 MEQ tablet Commonly known as: KLOR-CON M TAKE 1 TABLET BY MOUTH ONCE  DAILY.         ROS:  {Review of Systems:30496}  Blood pressure 133/78, pulse 87, temperature 98.3 F (36.8 C), temperature source Oral, resp. rate 12, height 5' 0.05" (1.525 m), weight 129 lb (58.5 kg), SpO2 97 %. Physical Exam  Results: CLINICAL DATA:  Right upper quadrant pain, especially after eating greasy foods   EXAM: ULTRASOUND ABDOMEN LIMITED RIGHT UPPER QUADRANT   COMPARISON:  Ultrasound 10/10/2021   FINDINGS: Gallbladder:   The gallbladder is contracted. Negative sonographic Murphy sign. No gallstones identified.   Common bile duct:   Diameter: 3 mm, normal.  No intrahepatic ductal dilation.   Liver:   No focal lesion identified. Within normal limits in parenchymal echogenicity. Portal vein is patent on color Doppler imaging with normal direction of blood flow towards the liver.   Other: None.   IMPRESSION: Contracted gallbladder without intraluminal stones identified.   No evidence of biliary obstruction.     Electronically Signed   By: Maurine Simmering M.D.   On: 06/20/2022 16:00     Assessment & Plan:  Tasha [name] is a 72 y.o. female with *** -*** -*** -Follow up ***  All questions were answered to the satisfaction of the patient and family***.  The risk and benefits of *** were discussed including but not limited to ***.  After careful consideration, Tasha [name] has decided to ***.    Virl Cagey 07/09/2022, 9:25 AM

## 2022-07-11 ENCOUNTER — Encounter (HOSPITAL_COMMUNITY)
Admission: RE | Admit: 2022-07-11 | Discharge: 2022-07-11 | Disposition: A | Discharge status: Home / Self Care | Payer: Medicare Other | Source: Ambulatory Visit | Attending: General Surgery | Admitting: General Surgery

## 2022-07-11 DIAGNOSIS — R1011 Right upper quadrant pain: Secondary | ICD-10-CM | POA: Diagnosis not present

## 2022-07-11 MED ORDER — SINCALIDE 5 MCG IJ SOLR
INTRAMUSCULAR | Status: AC
  Filled 2022-07-11: qty 5

## 2022-07-11 MED ORDER — STERILE WATER FOR INJECTION IJ SOLN
INTRAMUSCULAR | Status: AC
  Filled 2022-07-11: qty 10

## 2022-07-11 MED ORDER — TECHNETIUM TC 99M MEBROFENIN IV KIT
5.0000 | PACK | Freq: Once | INTRAVENOUS | Status: AC | PRN
  Administered 2022-07-11: 5 via INTRAVENOUS

## 2022-07-16 ENCOUNTER — Ambulatory Visit (INDEPENDENT_AMBULATORY_CARE_PROVIDER_SITE_OTHER): Payer: Medicare Other | Admitting: General Surgery

## 2022-07-16 ENCOUNTER — Other Ambulatory Visit: Payer: Self-pay

## 2022-07-16 ENCOUNTER — Encounter: Payer: Self-pay | Admitting: General Surgery

## 2022-07-16 VITALS — BP 134/80 | HR 89 | Temp 98.3°F | Resp 16 | Ht 60.0 in | Wt 128.0 lb

## 2022-07-16 DIAGNOSIS — R1011 Right upper quadrant pain: Secondary | ICD-10-CM

## 2022-07-16 NOTE — Patient Instructions (Addendum)
The image on your gallbladder was normal. You can monitor your diet and symptoms and if you are having right upper abdominal pain with greasy/ fatty food, then give [place] a call to discuss removal of the gallbladder.   La imagen de su vescula biliar fue normal. Puede controlar su dieta y sus sntomas y, si tiene dolor en la parte superior derecha del abdomen con alimentos grasosos/grasos, llmenos para analizar la extirpacin de la vescula biliar.

## 2022-07-16 NOTE — Progress Notes (Signed)
[facility]  Patient came back after HIDA to discuss the results and option. She has no stones on her [place] and her HIDA was negative. She has not had further symptoms. She is not really eating much fatty food to date though. She is here with the interpretor and her family.   BP 134/80   Pulse 89   Temp 98.3 F (36.8 C) (Oral)   Resp 16   Ht 5' (1.524 m)   Wt 128 lb (58.1 kg)   SpO2 97%   BMI 25.00 kg/m  Soft, nontender, nondistended  Results:  CLINICAL DATA:  Right upper quadrant abdominal pain.   EXAM: NUCLEAR MEDICINE HEPATOBILIARY IMAGING WITH GALLBLADDER EF   TECHNIQUE: Sequential images of the abdomen were obtained out to 60 minutes following intravenous administration of radiopharmaceutical. After slow intravenous infusion of 1.2 micrograms Cholecystokinin, gallbladder ejection fraction was determined.   RADIOPHARMACEUTICALS:  5.0 mCi Tc-80m Choletec IV   COMPARISON:  Ultrasound June 20, 2022   FINDINGS: Prompt uptake and biliary excretion of activity by the liver is seen. Gallbladder activity is visualized, consistent with patency of cystic duct. Biliary activity passes into small bowel, consistent with patent common bile duct.   Calculated gallbladder ejection fraction is 59%. (At 60 min, normal ejection fraction is greater than 40%.)   IMPRESSION: 1.  Patent cystic and common bile ducts.   2.  Normal gallbladder ejection fraction.     Electronically Signed   By: Dahlia Bailiff M.D.   On: 07/11/2022 12:14   Patient with an episode of RUQ pain with a [place] rellano. Patient feels better and no further symptoms. Discussed that despite all normal imaging that the gallbladder could still be causing symptoms. Discussed that she can wait and see what happens with her symptoms if she wants to at this time or we could pursue cholecystectomy. She could have some degree of biliary colic.   For now she wants to wait and trial her diet and see  what happens. She will call if she becomes symptomatic and wants to pursue surgery.  Curlene Labrum, MD [facility] [address] [place], [place] [address] [phone] (office)

## 2022-07-26 ENCOUNTER — Other Ambulatory Visit: Payer: Self-pay | Admitting: Family Medicine

## 2022-07-31 ENCOUNTER — Ambulatory Visit (INDEPENDENT_AMBULATORY_CARE_PROVIDER_SITE_OTHER): Payer: Medicare Other | Admitting: Family Medicine

## 2022-07-31 ENCOUNTER — Encounter: Payer: Self-pay | Admitting: Family Medicine

## 2022-07-31 VITALS — BP 139/77 | HR 86 | Ht 60.0 in | Wt 129.0 lb

## 2022-07-31 DIAGNOSIS — R1011 Right upper quadrant pain: Secondary | ICD-10-CM | POA: Diagnosis not present

## 2022-07-31 NOTE — Progress Notes (Signed)
BP 139/77   Pulse 86   Ht 5' (1.524 m)   Wt 129 lb (58.5 kg)   SpO2 96%   BMI 25.19 kg/m    Subjective:   [name], female    DOB: 09/09/1950, 72 y.o.   MRN: [id number]  HPI: Tasha LloydDelfina Klomp is a 72 y.o. female presenting on 07/31/2022 for Abdominal Pain (RUQ- Pain has improved. Had negative HIDA and there were no gallstones seen. Pt had consult with Dr. Henreitta LeberBridges. Pt is not wanting surgery at this time. She will focus on diet per Dr. Henreitta LeberBridges note)   HPI Right upper quadrant abdominal pain Patient is coming in for follow-up on right upper quadrant abdominal pain, she had an ultrasound and then a HIDA scan which were both normal but she has seen surgery and they discussed the findings with her.  She says if she smells a lot or eats anything or or is near anything with oil she will feel upset to her stomach and then get diarrhea.  She says it has been happening less often but still happening at times.  She says it has been less severe.  Relevant past medical, surgical, family and social history reviewed and updated as indicated. Interim medical history since our last visit reviewed. Allergies and medications reviewed and updated.  Review of Systems  Constitutional:  Negative for chills and fever.  HENT:  Negative for congestion, ear discharge and ear pain.   Eyes:  Negative for redness and visual disturbance.  Respiratory:  Negative for chest tightness and shortness of breath.   Cardiovascular:  Negative for chest pain and leg swelling.  Gastrointestinal:  Positive for abdominal distention, abdominal pain, diarrhea and vomiting. Negative for constipation and rectal pain.  Genitourinary:  Negative for difficulty urinating and dysuria.  Musculoskeletal:  Negative for back pain and gait problem.  Skin:  Negative for rash.  Neurological:  Negative for light-headedness and headaches.  Psychiatric/Behavioral:  Negative for agitation and behavioral problems.   All other  systems reviewed and are negative.   Per HPI unless specifically indicated above   Allergies as of 07/31/2022       Reactions   Hydromet [hydrocodone Bit-homatrop Mbr]    Pt doesn't remember   Toprol Xl [metoprolol] Hives   Facial rash, itching, hives   Amoxicillin Rash   Did it involve swelling of the face/tongue/throat, SOB, or low BP? No Did it involve sudden or severe rash/hives, skin peeling, or any reaction on the inside of your mouth or nose? Unknown Did you need to seek medical attention at a hospital or doctor's office? Unknown When did it last happen?      10 + years If all above answers are "NO", may proceed with cephalosporin use.        Medication List        Accurate as of July 31, 2022  3:08 PM. If you have any questions, ask your nurse or doctor.          acetaminophen 500 MG tablet Commonly known as: TYLENOL Take 1 tablet (500 mg total) by mouth every 6 (six) hours as needed.   aspirin EC 81 MG tablet Take 1 tablet (81 mg total) by mouth daily.   atorvastatin 20 MG tablet Commonly known as: LIPITOR TAKE 1 TABLET BY MOUTH DAILY AT 6 PM.   budesonide 180 MCG/ACT inhaler Commonly known as: PULMICORT Inhale 2 puffs into the lungs in the morning and at bedtime.   cetirizine 10  MG tablet Commonly known as: ZYRTEC Take 1 tablet (10 mg total) by mouth daily as needed for allergies.   diltiazem 120 MG 24 hr capsule Commonly known as: CARDIZEM CD TAKE 1 CAPSULE BY MOUTH ONCE DAILY.   hydrochlorothiazide 25 MG tablet Commonly known as: HYDRODIURIL TAKE 1 TABLET ONCE DAILY.   ibuprofen 200 MG tablet Commonly known as: ADVIL Take 400 mg by mouth every 6 (six) hours as needed for headache or moderate pain.   ipratropium-albuterol 0.5-2.5 (3) MG/3ML Soln Commonly known as: DUONEB Take 3 mLs by nebulization every 6 (six) hours as needed.   levothyroxine 50 MCG tablet Commonly known as: SYNTHROID TAKE 1 TABLET BY MOUTH DAILY BEFORE BREAKFAST.    losartan 50 MG tablet Commonly known as: COZAAR TAKE 1 TABLET ONCE DAILY.   Ofev 100 MG Caps Generic drug: Nintedanib Take 1 capsule (100 mg total) by mouth 2 (two) times daily.   Omega 3 1000 MG Caps Take 1,000 mg by mouth daily.   ondansetron 8 MG disintegrating tablet Commonly known as: ZOFRAN-ODT Take 1 tablet (8 mg total) by mouth every 6 (six) hours as needed for nausea or vomiting.   pantoprazole 40 MG tablet Commonly known as: PROTONIX Take 1 tablet (40 mg total) by mouth daily.   potassium chloride SA 20 MEQ tablet Commonly known as: KLOR-CON M TAKE 1 TABLET BY MOUTH ONCE DAILY.         Objective:   BP 139/77   Pulse 86   Ht 5' (1.524 m)   Wt 129 lb (58.5 kg)   SpO2 96%   BMI 25.19 kg/m   Wt Readings from Last 3 Encounters:  07/31/22 129 lb (58.5 kg)  07/16/22 128 lb (58.1 kg)  07/09/22 129 lb (58.5 kg)    Physical Exam Vitals and nursing note reviewed.  Constitutional:      General: She is not in acute distress.    Appearance: She is well-developed. She is not diaphoretic.  Eyes:     Conjunctiva/sclera: Conjunctivae normal.  Cardiovascular:     Rate and Rhythm: Normal rate and regular rhythm.     Heart sounds: Normal heart sounds. No murmur heard. Pulmonary:     Effort: Pulmonary effort is normal. No respiratory distress.     Breath sounds: Normal breath sounds. No wheezing.  Abdominal:     General: Abdomen is flat. Bowel sounds are normal. There is no distension.     Tenderness: There is no abdominal tenderness. There is no right CVA tenderness, left CVA tenderness, guarding or rebound.  Musculoskeletal:        General: No tenderness. Normal range of motion.  Skin:    General: Skin is warm and dry.     Findings: No rash.  Neurological:     Mental Status: She is alert and oriented to person, place, and time.     Coordination: Coordination normal.  Psychiatric:        Behavior: Behavior normal.       Assessment & Plan:   Problem  List Items Addressed This Visit   None Visit Diagnoses     Colicky RUQ abdominal pain    -  Primary   Relevant Orders   Ambulatory referral to Gastroenterology       Will do referral to gastroenterology, continue to try with her diet and see if improved. Follow up plan: Return if symptoms worsen or fail to improve.  Counseling provided for all of the vaccine components Orders Placed This Encounter  Procedures   Ambulatory referral to Gastroenterology    Arville Care, MD [facility] 07/31/2022, 3:08 PM

## 2022-08-14 ENCOUNTER — Telehealth: Payer: Self-pay | Admitting: Family Medicine

## 2022-08-14 NOTE — Telephone Encounter (Signed)
Called patient to schedule Medicare Annual Wellness Visit (AWV). No voicemail available to leave a message.  Last date of AWV: 09/22/2020   Please schedule an appointment at any time with Vernona Rieger, [facility]. .  If any questions, please contact me at [phone].  Thank you,  Judeth Cornfield,  AMB Clinical Support [facility] AWV Program Direct Dial ??[phone]

## 2022-08-26 ENCOUNTER — Other Ambulatory Visit: Payer: Self-pay | Admitting: Internal Medicine

## 2022-08-26 DIAGNOSIS — J849 Interstitial pulmonary disease, unspecified: Secondary | ICD-10-CM

## 2022-09-23 ENCOUNTER — Other Ambulatory Visit: Payer: Self-pay | Admitting: Family Medicine

## 2022-09-23 DIAGNOSIS — E785 Hyperlipidemia, unspecified: Secondary | ICD-10-CM

## 2022-09-26 ENCOUNTER — Encounter: Payer: Self-pay | Admitting: Family Medicine

## 2022-09-26 ENCOUNTER — Ambulatory Visit (INDEPENDENT_AMBULATORY_CARE_PROVIDER_SITE_OTHER): Payer: Medicare Other | Admitting: Family Medicine

## 2022-09-26 VITALS — BP 117/70 | HR 78 | Ht 60.0 in | Wt 122.0 lb

## 2022-09-26 DIAGNOSIS — K219 Gastro-esophageal reflux disease without esophagitis: Secondary | ICD-10-CM

## 2022-09-26 DIAGNOSIS — I10 Essential (primary) hypertension: Secondary | ICD-10-CM | POA: Diagnosis not present

## 2022-09-26 DIAGNOSIS — R7303 Prediabetes: Secondary | ICD-10-CM

## 2022-09-26 DIAGNOSIS — E785 Hyperlipidemia, unspecified: Secondary | ICD-10-CM | POA: Diagnosis not present

## 2022-09-26 DIAGNOSIS — E039 Hypothyroidism, unspecified: Secondary | ICD-10-CM | POA: Diagnosis not present

## 2022-09-26 LAB — BAYER DCA HB A1C WAIVED: HB A1C (BAYER DCA - WAIVED): 5.1 % (ref 4.8–5.6)

## 2022-09-26 MED ORDER — PANTOPRAZOLE SODIUM 40 MG PO TBEC: 40.0000 mg | DELAYED_RELEASE_TABLET | Freq: Every day | ORAL | 3 refills | Status: DC

## 2022-09-26 MED ORDER — ATORVASTATIN CALCIUM 20 MG PO TABS: 20.0000 mg | ORAL_TABLET | Freq: Every day | ORAL | 3 refills | Status: DC

## 2022-09-26 NOTE — Progress Notes (Signed)
BP 117/70   Pulse 78   Ht 5' (1.524 m)   Wt 122 lb (55.3 kg)   SpO2 98%   BMI 23.83 kg/m    Subjective:   [name], female    DOB: 01/25/1951, 72 y.o.   MRN: [id number]  HPI: Tasha [name] is a 72 y.o. female presenting on 09/26/2022 for Medical Management of Chronic Issues, Hypertension, and Hyperlipidemia   HPI Hypertension Patient is currently on losartan and diltiazem and hydrochlorothiazide, and their blood pressure today is 117/70. Patient denies any lightheadedness or dizziness. Patient denies headaches, blurred vision, chest pains, shortness of breath, or weakness. Denies any side effects from medication and is content with current medication.   Hyperlipidemia Patient is coming in for recheck of his hyperlipidemia. The patient is currently taking fish oils and atorvastatin. They deny any issues with myalgias or history of liver damage from it. They deny any focal numbness or weakness or chest pain.   GERD Patient is currently on pantoprazole.  She denies any major symptoms or abdominal pain or belching or burping. She denies any blood in her stool or lightheadedness or dizziness.   Prediabetes Patient comes in today for recheck of his diabetes. Patient has been currently taking no medicine currently, diet control. Patient is currently on an ACE inhibitor/ARB. Patient has not seen an ophthalmologist this year. Patient denies any new issues with their feet. The symptom started onset as an adult hypertension and hyperlipidemia and hypothyroidism ARE RELATED TO DM   Hypothyroidism recheck Patient is coming in for thyroid recheck today as well. They deny any issues with hair changes or heat or cold problems or diarrhea or constipation. They deny any chest pain or palpitations. They are currently on levothyroxine 50 micrograms h  Relevant past medical, surgical, family and social history reviewed and updated as indicated. Interim medical history since our last  visit reviewed. Allergies and medications reviewed and updated.  Review of Systems  Constitutional:  Negative for chills and fever.  Eyes:  Negative for visual disturbance.  Respiratory:  Negative for chest tightness and shortness of breath.   Cardiovascular:  Negative for chest pain and leg swelling.  Genitourinary:  Negative for difficulty urinating and dysuria.  Musculoskeletal:  Negative for back pain and gait problem.  Skin:  Negative for rash.  Neurological:  Negative for dizziness, light-headedness and headaches.  Psychiatric/Behavioral:  Negative for agitation and behavioral problems.   All other systems reviewed and are negative.   Per HPI unless specifically indicated above   Allergies as of 09/26/2022       Reactions   Hydromet [hydrocodone Bit-homatrop Mbr]    Pt doesn't remember   Toprol Xl [metoprolol] Hives   Facial rash, itching, hives   Amoxicillin Rash   Did it involve swelling of the face/tongue/throat, SOB, or low BP? No Did it involve sudden or severe rash/hives, skin peeling, or any reaction on the inside of your mouth or nose? Unknown Did you need to seek medical attention at a hospital or doctor's office? Unknown When did it last happen?      10 + years If all above answers are "NO", may proceed with cephalosporin use.        Medication List        Accurate as of September 26, 2022  3:42 PM. If you have any questions, ask your nurse or doctor.          acetaminophen 500 MG tablet Commonly known as: TYLENOL  Take 1 tablet (500 mg total) by mouth every 6 (six) hours as needed.   aspirin EC 81 MG tablet Take 1 tablet (81 mg total) by mouth daily.   atorvastatin 20 MG tablet Commonly known as: LIPITOR Take 1 tablet (20 mg total) by mouth daily. What changed: when to take this Changed by: [name] Emelyn Roen, MD   budesonide 180 MCG/ACT inhaler Commonly known as: PULMICORT Inhale 2 puffs into the lungs in the morning and at bedtime.   cetirizine  10 MG tablet Commonly known as: ZYRTEC Take 1 tablet (10 mg total) by mouth daily as needed for allergies.   diltiazem 120 MG 24 hr capsule Commonly known as: CARDIZEM CD TAKE 1 CAPSULE BY MOUTH ONCE DAILY.   hydrochlorothiazide 25 MG tablet Commonly known as: HYDRODIURIL TAKE 1 TABLET ONCE DAILY.   ibuprofen 200 MG tablet Commonly known as: ADVIL Take 400 mg by mouth every 6 (six) hours as needed for headache or moderate pain.   ipratropium-albuterol 0.5-2.5 (3) MG/3ML Soln Commonly known as: DUONEB Take 3 mLs by nebulization every 6 (six) hours as needed.   levothyroxine 50 MCG tablet Commonly known as: SYNTHROID TAKE 1 TABLET BY MOUTH DAILY BEFORE BREAKFAST.   losartan 50 MG tablet Commonly known as: COZAAR TAKE 1 TABLET ONCE DAILY.   Ofev 100 MG Caps Generic drug: Nintedanib TAKE 1 CAPSULE BY MOUTH TWICE DAILY WITH FOOD.   Omega 3 1000 MG Caps Take 1,000 mg by mouth daily.   ondansetron 8 MG disintegrating tablet Commonly known as: ZOFRAN-ODT Take 1 tablet (8 mg total) by mouth every 6 (six) hours as needed for nausea or vomiting.   pantoprazole 40 MG tablet Commonly known as: PROTONIX Take 1 tablet (40 mg total) by mouth daily.   potassium chloride SA 20 MEQ tablet Commonly known as: KLOR-CON M TAKE 1 TABLET BY MOUTH ONCE DAILY.         Objective:   BP 117/70   Pulse 78   Ht 5' (1.524 m)   Wt 122 lb (55.3 kg)   SpO2 98%   BMI 23.83 kg/m   Wt Readings from Last 3 Encounters:  09/26/22 122 lb (55.3 kg)  07/31/22 129 lb (58.5 kg)  07/16/22 128 lb (58.1 kg)    Physical Exam Vitals and nursing note reviewed.  Constitutional:      General: She is not in acute distress.    Appearance: She is well-developed. She is not diaphoretic.  Eyes:     Conjunctiva/sclera: Conjunctivae normal.  Cardiovascular:     Rate and Rhythm: Normal rate and regular rhythm.     Heart sounds: Normal heart sounds. No murmur heard. Pulmonary:     Effort: Pulmonary  effort is normal. No respiratory distress.     Breath sounds: Normal breath sounds. No wheezing.  Musculoskeletal:        General: No swelling or tenderness. Normal range of motion.  Skin:    General: Skin is warm and dry.     Findings: No rash.  Neurological:     Mental Status: She is alert and oriented to person, place, and time.     Coordination: Coordination normal.  Psychiatric:        Behavior: Behavior normal.       Assessment & Plan:   Problem List Items Addressed This Visit       Cardiovascular and Mediastinum   Hypertension - Primary (Chronic)   Relevant Medications   atorvastatin (LIPITOR) 20 MG tablet  Other Relevant Orders   Bayer DCA Hb A1c Waived (Completed)   Lipid panel   TSH   CBC with Differential/Platelet   BMP8+EGFR     Digestive   Gastroesophageal reflux disease without esophagitis   Relevant Medications   pantoprazole (PROTONIX) 40 MG tablet     Endocrine   Hypothyroidism   Relevant Orders   Bayer DCA Hb A1c Waived (Completed)   Lipid panel   TSH   CBC with Differential/Platelet   BMP8+EGFR     Other   Hyperlipidemia with target LDL less than 100 (Chronic)   Relevant Medications   atorvastatin (LIPITOR) 20 MG tablet   Other Relevant Orders   Bayer DCA Hb A1c Waived (Completed)   Lipid panel   TSH   CBC with Differential/Platelet   BMP8+EGFR   Prediabetes   Relevant Orders   Bayer DCA Hb A1c Waived (Completed)   Lipid panel   TSH   CBC with Differential/Platelet   BMP8+EGFR    A1c looks good at 5.1, continue current medicine, no changes, seems to be doing well.  She does have some stomach issues with the Ofev and recommended that she talk with her specialist about it Follow up plan: Return in about 3 months (around 12/27/2022), or if symptoms worsen or fail to improve, for Hypertension and hyperlipidemia and prediabetes.  Counseling provided for all of the vaccine components Orders Placed This Encounter  Procedures   Bayer  DCA Hb A1c Waived   Lipid panel   TSH   CBC with Differential/Platelet   BMP8+EGFR    Arville Care, MD [facility] 09/26/2022, 3:42 PM

## 2022-09-27 LAB — BMP8+EGFR
BUN/Creatinine Ratio: 15 (ref 12–28)
BUN: 13 mg/dL (ref 8–27)
CO2: 25 mmol/L (ref 20–29)
Calcium: 9.6 mg/dL (ref 8.7–10.3)
Chloride: 100 mmol/L (ref 96–106)
Creatinine, Ser: 0.87 mg/dL (ref 0.57–1.00)
Glucose: 96 mg/dL (ref 70–99)
Potassium: 3.3 mmol/L — ABNORMAL LOW (ref 3.5–5.2)
Sodium: 141 mmol/L (ref 134–144)
eGFR: 71 mL/min/{1.73_m2} (ref >59)

## 2022-09-27 LAB — CBC WITH DIFFERENTIAL/PLATELET
Basophils Absolute: 0 10*3/uL (ref 0.0–0.2)
Basos: 0 %
EOS (ABSOLUTE): 0.2 10*3/uL (ref 0.0–0.4)
Eos: 3 %
Hematocrit: 38.4 % (ref 34.0–46.6)
Hemoglobin: 12.7 g/dL (ref 11.1–15.9)
Immature Grans (Abs): 0 10*3/uL (ref 0.0–0.1)
Immature Granulocytes: 0 %
Lymphocytes Absolute: 1.8 10*3/uL (ref 0.7–3.1)
Lymphs: 23 %
MCH: 32 pg (ref 26.6–33.0)
MCHC: 33.1 g/dL (ref 31.5–35.7)
MCV: 97 fL (ref 79–97)
Monocytes Absolute: 1 10*3/uL — ABNORMAL HIGH (ref 0.1–0.9)
Monocytes: 13 %
Neutrophils Absolute: 4.8 10*3/uL (ref 1.4–7.0)
Neutrophils: 61 %
Platelets: 125 10*3/uL — ABNORMAL LOW (ref 150–450)
RBC: 3.97 x10E6/uL (ref 3.77–5.28)
RDW: 12.5 % (ref 11.7–15.4)
WBC: 7.9 10*3/uL (ref 3.4–10.8)

## 2022-09-27 LAB — LIPID PANEL
Chol/HDL Ratio: 3.2 ratio (ref 0.0–4.4)
Cholesterol, Total: 133 mg/dL (ref 100–199)
HDL: 42 mg/dL (ref >39)
LDL Chol Calc (NIH): 74 mg/dL (ref 0–99)
Triglycerides: 88 mg/dL (ref 0–149)
VLDL Cholesterol Cal: 17 mg/dL (ref 5–40)

## 2022-09-27 LAB — TSH: TSH: 3.48 u[IU]/mL (ref 0.450–4.500)

## 2022-10-03 NOTE — Progress Notes (Signed)
Patient r/c, please call [phone]

## 2022-11-01 ENCOUNTER — Other Ambulatory Visit: Payer: Self-pay | Admitting: Family Medicine

## 2022-11-08 ENCOUNTER — Other Ambulatory Visit: Payer: Self-pay | Admitting: Family Medicine

## 2022-11-25 ENCOUNTER — Other Ambulatory Visit: Payer: Self-pay | Admitting: Internal Medicine

## 2022-11-25 DIAGNOSIS — J849 Interstitial pulmonary disease, unspecified: Secondary | ICD-10-CM

## 2022-11-26 ENCOUNTER — Telehealth: Payer: Self-pay | Admitting: Internal Medicine

## 2022-11-26 ENCOUNTER — Other Ambulatory Visit: Payer: Self-pay | Admitting: Family Medicine

## 2022-11-26 DIAGNOSIS — I10 Essential (primary) hypertension: Secondary | ICD-10-CM

## 2022-11-26 NOTE — Telephone Encounter (Signed)
Called and spoke with patient, who put her granddaughter Tasha [name] on the phone.  Tasha [name] stated patient is taking Ofev.  Tasha [name] stated she has started losing weight, experiencing nausea, upset stomach, and diarrhea. Tasha [name] is requesting recommendations for the Ofev with current side effects she is experiencing.    Message routed to Dr. Marchelle Gearing to advise on Ofev

## 2022-11-26 NOTE — Telephone Encounter (Signed)
Stop ofev Call back in 1 week if symptoms resolved Call back in anytime if getting worse Shuld start seeing improvement in 48h

## 2022-11-26 NOTE — Telephone Encounter (Signed)
Patient has lost 2-3 lbs per week since starting Ofev. She is reporting upset stomach and increased diaheria. LOV 06/14/22. Patient did contact PCP, and he wanted her to see [place]. No acutes avail at time of call. Pharmacy is [facility] in [place]. Informed Tasha [name] that we will call her back if she is listed on DPR, but if she is not we will call patient with Interpreter. Please advise.

## 2022-11-26 NOTE — Telephone Encounter (Signed)
Refill sent for OFEV to [facility] (pulmonary fibrosis team): [phone]  Dose: 100 mg twice daily  Last OV: 06/14/2022 Provider: Rhunette Croft, Dr. Marchelle Gearing  Next OV: 12/26/22 LFTs on 09/26/22 - wnl. Labs due to be repeated in September 2024.  Chesley Mires, PharmD, MPH, BCPS Clinical Pharmacist (Rheumatology and Pulmonology)

## 2022-11-27 NOTE — Telephone Encounter (Signed)
Called and spoke with Tasha [name].  Dr. Jane Canary recommendations given.  Understanding stated.  Nothing further at this time.

## 2022-12-05 ENCOUNTER — Telehealth: Payer: Self-pay | Admitting: Internal Medicine

## 2022-12-05 NOTE — Telephone Encounter (Signed)
ATC granddaughter, Itzel.  No answer.  No VM.

## 2022-12-05 NOTE — Telephone Encounter (Signed)
Granddaughter called the answering service. Patient is complaining of chest pain and SOB.

## 2022-12-06 NOTE — Telephone Encounter (Signed)
Spoke with patient's daughter per [facility]     Pt calling in complaining of chest pain and SOB since yesterday & today she is saying the OFEV that she stopped is causing her to lose weight and have diarrhea. Pt is scheduled to see MR on 09/05 is it okay for me to double book this pt in you 4 pm slot today for an acute visit ?     If having active chest pain and sob needs to be evaluated in ED. She should have stopped OFEV. Weight loss and diarrhea should not be getting worse after stopping medication. Kendal Hymen can you call patient  Also Shine Mikes with the diarrhea concern labs could be abnormal, her potassium has been as low as 2.9 in the past which can cause cardiac arrhthymias. Best to go to ED or an urgent care   Patient's daughter's voice was understanding.Nothing else further needed.

## 2022-12-13 ENCOUNTER — Other Ambulatory Visit: Payer: Self-pay | Admitting: Family Medicine

## 2022-12-26 ENCOUNTER — Encounter: Payer: Self-pay | Admitting: Internal Medicine

## 2022-12-26 ENCOUNTER — Ambulatory Visit (INDEPENDENT_AMBULATORY_CARE_PROVIDER_SITE_OTHER): Payer: Medicare Other | Admitting: Internal Medicine

## 2022-12-26 VITALS — BP 122/78 | HR 84 | Temp 97.6°F | Ht 61.0 in | Wt 116.8 lb

## 2022-12-26 DIAGNOSIS — R634 Abnormal weight loss: Secondary | ICD-10-CM | POA: Diagnosis not present

## 2022-12-26 DIAGNOSIS — R7989 Other specified abnormal findings of blood chemistry: Secondary | ICD-10-CM

## 2022-12-26 DIAGNOSIS — Z5181 Encounter for therapeutic drug level monitoring: Secondary | ICD-10-CM

## 2022-12-26 DIAGNOSIS — J849 Interstitial pulmonary disease, unspecified: Secondary | ICD-10-CM

## 2022-12-26 DIAGNOSIS — T50905A Adverse effect of unspecified drugs, medicaments and biological substances, initial encounter: Secondary | ICD-10-CM | POA: Diagnosis not present

## 2022-12-26 DIAGNOSIS — K521 Toxic gastroenteritis and colitis: Secondary | ICD-10-CM

## 2022-12-26 DIAGNOSIS — R768 Other specified abnormal immunological findings in serum: Secondary | ICD-10-CM | POA: Diagnosis not present

## 2022-12-26 LAB — PULMONARY FUNCTION TEST
DL/VA % pred: 95 %
DL/VA: 4.05 ml/min/mmHg/L
DLCO cor % pred: 73 %
DLCO cor: 12.58 ml/min/mmHg
DLCO unc % pred: 73 %
DLCO unc: 12.58 ml/min/mmHg
FEF 25-75 Pre: 0.84 L/s
FEF2575-%Pred-Pre: 52 %
FEV1-%Pred-Pre: 57 %
FEV1-Pre: 1.08 L
FEV1FVC-%Pred-Pre: 65 %
FEV6-%Pred-Pre: 90 %
FEV6-Pre: 2.16 L
FEV6FVC-%Pred-Pre: 105 %
FVC-%Pred-Pre: 86 %
FVC-Pre: 2.18 L
Pre FEV1/FVC ratio: 50 %
Pre FEV6/FVC Ratio: 100 %

## 2022-12-26 NOTE — Patient Instructions (Addendum)
ICD-10-CM   1. Interstitial lung disease (HCC)  J84.9     2. ANA positive  R76.8     3. Abnormal LFTs  R79.89     4. Medication monitoring encounter  Z51.81     5. Drug-induced weight loss  R63.4    T50.905A     6. Diarrhea due to drug  K52.1         - -BAL lymphocytosis August 2019 following antifreeze exposure and ANA positivity    -ILD got worse in October 2023 CT scan of the chest but since then subjectively stable and pulmonary function test stable.  Nintedanib might of helped but it is caused significant weight loss and diarrhea and now stopped since early to mid August 2024.  -Glad side effects of stopped and her weight is improved after stopping nintedanib/Ofev  -Liver function last checked February 2024 was abnormal  Plan - NO MORE OFEV    -  cma to list it as allergy - weight loss, diarrhea, nausea  -Given recent recovery from side effects and also current stability with pulmonary function test will hold off on recommending pirfenidone the other antifibrotic  -Check Lover function test today 12/26/2022  -Do high-resolution CT chest in 4 months  Follow-up - 4 months with Dr. Marchelle Gearing following CT scan of the chest  -If further progression then we will recommend second antifibrotic.  -ILD symptom score and simple walking desaturation test at the time of follow-up  -  Interpreter needed at follow-up

## 2022-12-26 NOTE — Patient Instructions (Signed)
Spiro/DLCO performed today. 

## 2022-12-26 NOTE — Progress Notes (Signed)
Spiro/DLCO performed today. 

## 2022-12-26 NOTE — Progress Notes (Signed)
OV 01/22/18 72 year old female, never smoked. PMH  Hypertension, metabolic syndrome, thrombocytopenia, prediabetes, interstitial lung disease, PAF. Not previously followed by [facility] pulmonary care. Admitted to [facility] from outside hospital on 08/27 with acute hypoxic respiratory failure in setting of bilateral pulmonary infiltrates. Work up indicated BOOPD. Dx subacute ILD vs pneumonia thought to be from exposure to antifreeze leak in patients car. Requiring intubation from 08/30-09/05, chest tube placed due to pneumothorax. [facility] from 09/10- 09/20, required min assistance with mobility and basic self-care tasks. Speech therapy worked on dysphagia, able to tolerate regular diet thin liquids without signs and symptoms of aspiration.    Patient presents today for hospital follow-up visit. Accompanied by her daughter who also provides translation. She is feeling a lot better, no breathing issues. Finished oral steroid today. BS 119. CBC improving. Eating and drinking ok. No difficulties swallowing and denies aspiration. Ambulting with walker. Denies fall, sob, wheezing, cough, fever or chest pain.    Results for ONELL, CHRISTENSEN (MRN [id number]) as of 02/19/2018 10:01  Ref. Range 12/16/2017 19:24 12/17/2017 18:59  Sed Rate Latest Ref Range: 0 - 22 mm/hr 76 (H) 69 (H)  Results for DENETRICE, STARCK (MRN [id number]) as of 02/19/2018 10:01  Ref. Range 12/17/2017 18:59  Speckled Pattern Unknown 1:320 (H)  Results for TAMEKKA, GORES (MRN [id number]) as of 02/19/2018 10:01  Ref. Range 12/19/2017 14:15  Color, Fluid Latest Ref Range: YELLOW  COLORLESS (A)  WBC, Fluid Latest Ref Range: 0 - 1,000 cu mm 93  Lymphs, Fluid Latest Units: % 78  Eos, Fluid Latest Units: % 2  Appearance, Fluid Latest Ref Range: CLEAR  CLEAR (A)  Neutrophil Count, Fluid Latest Ref Range: 0 - 25 % 7  Monocyte-Macrophage-Serous Fluid Latest Ref Range: 50 - 90 % 13 (L)     OV  02/19/2018  Subjective:  [name], female , DOB: 18-May-1950 , age 16 y.o. , MRN: [id number] , ADDRESS: [address] [place] [address]   02/19/2018 -   Chief Complaint  Patient presents with   Follow-up    States she still has dry cough, states the SOB has resolved. Denies chest pain or idscomfort.      HPI Tasha [name] 72 y.o. -accompanied by her daughter [name].  End of August 2019 she suffered acute lung injury possibly Boop based on the nature of infiltrates and a high ESR and a positive ANA [BAL lymphocytosis].  There is some history of antifreeze exposure.  She has recovered from it and is now at home.  She is brought in by Dr. [name].  [name] is doing the translation.  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  Post discharge in the hospital patient is off oxygen and is overall better.  Steroids ended 2 weeks ago but since the completion of steroids she feels the cough is coming back and getting worse.  Currently cough is moderate in intensity and without any sputum production.  2 days ago her primary care physician apparently changed 1 of her antihypertensives because of the cough.  I am unable to determine which one but I suspect it was an ACE inhibitor.  She is currently on losartan.  There is no fever or chills.  Current walking desaturation test appears adequate        ROS  OV 03/31/2018  Subjective:  [name], female , DOB: [birth year] , age 44 y.o. ,  MRN: [id number] , ADDRESS: [address] [place] [address]   03/31/2018 -   Chief Complaint  Patient presents with   Follow-up    pt states breathing is doing well. c/o non prod cough.     HPI Tasha [name] 72 y.o. -presents for interstitial lung disease acute with lymphocytic bronchoalveolar lavage after antifreeze exposure  After last visit she showed improvement.  We repeated ANA and this was negative.  We also did some extra  antibody such as anti-Jo 1 and this was negative.  Her ESR has normalized.  In the interim she continues to improve.  Shortness of breath is almost nonexistent.  Cough is improved but she still has mild residual cough.  She had high-resolution CT scan of the chest #2019 that I personally visualized and there is still some residual groundglass opacities along with some evolving fibrotic pattern.  It seems more predominant in the lung base.  Her her son Burley Saver is with her.  History is gained from talking to interpreter on the video camera.  His name is Shari Prows.  She denies any diabetes or hyperlipidemia or stroke in the past.  She does not have any mood swings.  In the past she has tolerated prednisone without problem.  Denies any bony issues.   OV 06/11/2018  Subjective:  [name], female , DOB: 01/21/1951 , age 41 y.o. , MRN: [id number] , ADDRESS: [address] [place] [address]  Tasha [name] 72 y.o. -presents for interstitial lung disease acute with lymphocytic bronchoalveolar lavage after antifreeze exposure 06/11/2018 -   Chief Complaint  Patient presents with   Follow-up    PFT performed today.  Pt states she has been doing well since last visit and denies any complaints.      HPI Tasha [name] 72 y.o. -follow-up.  At last visit in December 2019 I placed her on a 10-week prednisone course.  In between she came and saw a nurse practitioner approximately just over a month ago.  She was tolerating the prednisone fine.  It is now 4 weeks and she finished the prednisone.  According to the daughter who acted as the interpreter the prednisone really did help with her symptoms.  It improved her shortness of breath.  Currently she is nearly asymptomatic.  Her current symptoms are documented below.  She is feeling good.  She had pulmonary function test today with spirometry being normal and DLCO being near normal.  She is noted to be on ACE inhibitor but she denies any cough.  Last CT  scan of the chest was November 2019    OV 12/07/2019   Subjective:  [name], female , DOB: 30-Jul-1950, age 36 y.o. years. , MRN: [id number],  ADDRESS: [address] [place] [address] PCP  Dettinger, [name], MD Providers : Treatment Team:  Attending Provider: Kalman Shan, MD presents for interstitial lung disease acute with lymphocytic bronchoalveolar lavage after antifreeze exposure in aug 2019  Chief Complaint  Patient presents with   Follow-up    Last seen 06/11/2018. Pt states she has been about the same since last visit. Pt is still having complaints of cough and is coughing up occ white phlegm. Pt also has increased SOB when she is in enclosed areas that makes her feel like she cannot breathe.       HPI Dream Delpino 72 y.o. -presents with her daughter-in-law is for a face-to-face visit.  The interpreter is Vernona Rieger.  Vernona Rieger is from [facility].  It appears that overall since her last visit in February 2020 patient is stable.  In August 2020 she had a CT scan of the chest that shows presence of mild ongoing ILD.  She tells [place] that she is had the Covid vaccine.  She has been exercising at the [facility]  She does not use oxygen.  She has mild shortness of breath with exertion when she bends down and some mild cough but overall compared to 2019 she is slowly progressively better.  She does have some dry cough when she is sitting as well.  She needs to drink water to improve that.  There are no other new medical issues.  Her walking desaturation test is unchanged compared to February 2020.   however when she did the interstitial lung disease symptom questionnaire symptoms are significantly worse than before.  She really cannot pinpoint if she is definitely worse or not.  She did admit that she has significant environmental allergies to pollen dust and perfumes.  This is a new history.   CT chest high resolution Aug 2020 COMPARISON:  03/11/2018, 12/17/2017    FINDINGS: Cardiovascular: Aortic atherosclerosis. Normal heart size. Scattered coronary artery calcifications. No pericardial effusion.   Mediastinum/Nodes: No enlarged mediastinal, hilar, or axillary lymph nodes. Thyroid gland, trachea, and esophagus demonstrate no significant findings.   Lungs/Pleura: There is a mild pattern of pulmonary fibrosis with an apical to basal gradient featuring mild tubular bronchiectasis, irregular peripheral interstitial opacity with some superimposed ground-glass, and some evidence of bronchiolectasis in the lung bases. There is no overt honeycombing. No significant air trapping on expiratory phase imaging. There is a densely calcified benign pulmonary nodule of the left lung base. No pleural effusion or pneumothorax.  IMPRESSION: 1. There is a mild pattern of pulmonary fibrosis with an apical to basal gradient featuring mild tubular bronchiectasis, irregular peripheral interstitial opacity with some superimposed ground-glass, and some evidence of bronchiolectasis in the lung bases. There is no overt honeycombing. No significant air trapping on expiratory phase imaging. Findings are not significantly changed compared to prior examination and consistent with an "indeterminate for UIP" pattern fibrosis by ATS pulmonary fibrosis criteria.   2.  Aortic atherosclerosis and coronary artery disease.     Electronically Signed   By: Lauralyn Primes M.D.   On: 12/10/2018 12:13  Results for KANIKA, MALLO (MRN [id number]) as of 06/11/2018 12:13  Ref. Range 06/11/2018 10:31  FVC-Pre Latest Units: L 2.05  FVC-%Pred-Pre Latest Units: % 74  FEV1-Pre Latest Units: L 1.88  FEV1-%Pred-Pre Latest Units: % 90  Pre FEV1/FVC ratio Latest Units: % 92   Results for JOLAINE, HAMBRIC (MRN [id number]) as of 06/11/2018 12:13  Ref. Range 06/11/2018 10:31  DLCO unc Latest Units: ml/min/mmHg 13.22  DLCO unc % pred Latest Units: % 74  Results for MYSTICAL, STORZ (MRN  [id number]) as of 03/31/2018 16:37  Ref. Range 12/16/2017 19:24 12/17/2017 18:59 02/19/2018 10:53  Sed Rate Latest Ref Range: 0 - 30 mm/hr 76 (H) 69 (H) 18  Results for RYIAH, DEPAS (MRN [id number]) as of 03/31/2018 16:37  Ref. Range 02/19/2018 10:53 02/19/2018 11:03  Anti Nuclear Antibody(ANA) Latest Ref Range: Negative   Negative  Anti JO-1 Latest Ref Range: 0.0 - 0.9 AI  <0.2  ds DNA Ab Latest Units: IU/mL <1   Results for MARAE, BLAN (MRN [id number]) as of 01/25/2020 16:46  Ref. Range 11/25/2018 09:57 01/25/2019 09:31  Anti Nuclear Antibody (ANA) Latest Ref Range: NEGATIVE   POSITIVE (A)  ANA Pattern 1 Unknown  Nuclear, Nucleolar (A)  ANA Titer 1 Latest Units: titer Positive (A) 1:640 (H)  Cyclic Citrullin Peptide Ab Latest Units: UNITS 3 <16  ds DNA Ab Latest Units: IU/mL  <1  RA Latex Turbid. Latest Ref Range: <14 IU/mL 10.9 <14  ENA SM Ab Ser-aCnc Latest Ref Range: <1.0 NEG AI  <1.0 NEG  Homogeneous Pattern Unknown 1:160 (H)   Speckled Pattern Unknown 1:320 (H)       OV 01/25/2020   Subjective:  [name], female , DOB: 09/27/1950, age 67 y.o. years. , MRN: [id number],  ADDRESS: [address] [place] [address] PCP  Dettinger, [name], MD Providers : Treatment Team:  Attending Provider: Kalman Shan, MD   Chief Complaint  Patient presents with   Follow-up    cough decreased     August 2019 she suffered acute lung injury possibly Boop based on the nature of infiltrates and a high ESR and a positive ANA [BAL lymphocytosis].  There is some history of antifreeze exposure. -> ILD  Hx of env allergies - blood allergy test Aug 2021 - igE normal / but positive for Grass  Intermittent ANA positive - last +ve late 2020   HPI Tyneshia Aguilar 72 y.o. -presents with her daughter in law Merlene Laughter and also the interpreter Tobi Bastos.  This visit has been scheduled because last visit she had increased symptoms.  Therefore we did a high-resolution CT chest Dr.  Llana Aliment the radiologist thinks the slight progression.  However we also repeated pulmonary function test improvement/stabilization.  In terms of symptoms patient tells me her symptoms are actually improved.  ILD symptom score actually shows improvement.  She attributes this to exercise.  We did walking desaturation test and it is stable.  Glucotrol documented below.  He understands that she has pulmonary fibrosis.  In fact she says that she looked at [employer] and got extremely worried about pulmonary fibrosis.  At this point in time she denies any symptoms of connective tissue disease.  Overall she is stable.  She continues to have cough.  We did blood allergy work-up and is positive for grass mold IgE is normal.  She was supposed to exam nitric oxide test but this has not been done.  She has had a Covid vaccine and flu shot.  She had to have a booster counseled her about that.   No results found for: NITRICOXIDE     IMPRESSION: 1. The appearance of the lungs is compatible with interstitial lung disease, with mild progression of changes compared to the prior study, with a spectrum of findings considered probable usual interstitial pneumonia (UIP) per current ATS guidelines. 2. Aortic atherosclerosis, in addition to left main and 2 vessel coronary artery disease. Please note that although the presence of coronary artery calcium documents the presence of coronary artery disease, the severity of this disease and any potential stenosis cannot be assessed on this non-gated CT examination. Assessment for potential risk factor modification, dietary therapy or pharmacologic therapy may be warranted, if clinically indicated.   Aortic Atherosclerosis (ICD10-I70.0).     Electronically Signed   By: Trudie Reed M.D.   On: 12/31/2019 08:22 OV 10/26/2020  Subjective:  [name], female , DOB: 1950-09-09 , age 2 y.o. , MRN: [id number] , ADDRESS: [address] [place]  [address] PCP Dettinger, [name], MD Patient Care Team: Dettinger, [name], MD as PCP - General (Family Medicine) Laqueta Linden, MD (Inactive) as PCP - Cardiology (Cardiology) Eula Listen  Judie Petit, MD as Consulting Physician (Gastroenterology)  This Provider for this visit: Treatment Team:  Attending Provider: Kalman Shan, MD    10/26/2020 -   Chief Complaint  Patient presents with   Follow-up    Pt states she is feeling better since last visit. States that she does have some SOB but after using her inhaler twice a day she feels fine.    August 2019 she suffered acute lung injury possibly Boop based on the nature of infiltrates and a high ESR and a positive ANA [BAL lymphocytosis].  There is some history of antifreeze exposure. -> ILD  Hx of env allergies - blood allergy test Aug 2021 - igE normal / but positive for Grass  Intermittent ANA positive - last +ve late 2020   HPI Anjenette Lagerquist 72 y.o. -returns for follow-up with her daughter-in-law Merlene Laughter.  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  She feels the Pulmicort is helping her.  In fact symptoms improved after Pulmicort.  There are no interim health issues.  Symptom score is stable.  Walking desaturation test is stable.  Previously we checked ANA and is again positive the entire ANA profile as listed.  On exam she still has bilateral bibasal crackles      OV 04/27/2021  Subjective:  [name], female , DOB: April 17, 1951 , age 69 y.o. , MRN: [id number] , ADDRESS: [address] [place] [address] PCP Dettinger, [name], MD Patient Care Team: Dettinger, [name], MD as PCP - General (Family Medicine) Little Ishikawa, MD as PCP - Cardiology (Cardiology) Jena Gauss Gerrit Friends, MD as Consulting Physician (Gastroenterology)  This Provider for this visit: Treatment Team:  Attending Provider: Kalman Shan, MD    04/27/2021 -   Chief Complaint  Patient presents with   Follow-up     PFT performed today.  Pt states she is about the same since last visit.    August 2019 she suffered acute lung injury possibly Boop based on the nature of infiltrates and a high ESR and a positive ANA [BAL lymphocytosis].  There is some history of antifreeze exposure. -> ILD  Hx of env allergies - blood allergy test Aug 2021 - igE normal / but positive for Grass  Intermittent ANA positive - last +ve late 2020  HPI Jennette Finfrock 72 y.o. -returns for follow-up.  Her daughter acts as the interpreter.  Overall she feels stable.  This can be evidence in the ILD symptom score.  Also walking desaturation test is stable.  She is interested in pulmonary rehabilitation to improve her shortness of breath that is still there somewhat.  Her pulmonary function test shows and suggest decline in 3 years.  She is not feeling this.  Last CT scan of the chest was in September 2021.  Explained the need for repeat CT scan and pulmonary function test at a closer than 17-month interval and she is willing.  Explained that if there is evidence of progression we would consider antifibrotic's and she is willing.  Meanwhile she will get referred to pulmonary rehabilitation.    .      OV 07/26/2021  Subjective:  [name], female , DOB: 04/14/1951 , age 89 y.o. , MRN: [id number] , ADDRESS: [address] [place] [address] PCP Dettinger, [name], MD Patient Care Team: Dettinger, [name], MD as PCP - General (Family Medicine) Little Ishikawa, MD as PCP - Cardiology (Cardiology) Jena Gauss Gerrit Friends, MD as Consulting Physician (Gastroenterology)  This  Provider for this visit: Treatment Team:  Attending Provider: Kalman Shan, MD   07/26/2021 -   Chief Complaint  Patient presents with   Follow-up    PFT performed today.  Pt states she has been doing okay since last visit and denies any complaints.     HPI Bevan Oboyle 72 y.o. -ILD undifferentiated phenotype.  Presents for  follow-up.  Last visit was in January 2023.  There was some concern for progression so ordered high-resolution CT chest and pulmonary function testing.  For unclear reason she missed a high-resolution CT chest.  She does not remember she had an appointment.  CMA tells me that she missed appointment.  She did have pulmonary function testing.  It shows stability compared to January 2023 but possible decline compared to 2 years ago or 3 years ago.  Symptom wise she is going to pulmonary rehabilitation she says she is feeling better but the symptom score below is inaccurate.  This time it was filled by interpreter.  Previously has been filled by family.  So do not know which of the numbers are accurate.  Did indicate to her that there is recent stability in her fibrosis.  Did indicate to her that the goal is continued stability.  Did indicate to her maybe in the last few years she is slightly worse.  Did indicate to her that if there is progressive phenotype then she would need antifibrotic's.  Did indicate to her that given current stability and feeling better with rehabilitatin and without evidence of CT scan we will hold off on any antifibrotic initiation for today    OV 02/07/2022  Subjective:  [name], female , DOB: 05/07/1950 , age 58 y.o. , MRN: [id number] , ADDRESS: [address] [place] [address] PCP Dettinger, [name], MD Patient Care Team: Dettinger, [name], MD as PCP - General (Family Medicine) Little Ishikawa, MD as PCP - Cardiology (Cardiology) Jena Gauss Gerrit Friends, MD as Consulting Physician (Gastroenterology) Kalman Shan, MD as Consulting Physician (Pulmonary Disease)  This Provider for this visit: Treatment Team:  Attending Provider: Kalman Shan, MD    02/07/2022 -   Chief Complaint  Patient presents with   Follow-up    Review PFT and CT.  No sx noted today.  Would like flu vaccine today    HPI Danielle Mcconaghy 72 y.o. -returns for  follow-up.  Presents with her daughter-in-law Theadora Rama.  Interpreter is Dareen Piano from [employer].  She tells me that she is stable.  [facility] rehabilitation really worked well.  Symptom score shows stability she had pulmonary function test is also shows stability.  However she had high-resolution CT chest and I personally visualized this and compared it and showed it to her and her family.  I agree with the radiologist that significant worsening.  Therefore she has progressive phenotype.  The pattern is looking like probable UIP working towards definite of UIP which would be IPF but everything started after inhalation lung injury.  The scan shows coronary artery calcification.  No prior stress test.   She denies any GI issues of bleeding diathesis.  CT scan    OV 12/26/2022  Subjective:  [name], female , DOB: Mar 15, 1951 , age 26 y.o. , MRN: [id number] , ADDRESS: [address] [place] [address] PCP Dettinger, [name], MD Patient Care Team: Dettinger, [name], MD as PCP - General (Family Medicine) Marjo Bicker, MD as PCP - Cardiology (Cardiology) Corbin Ade,  MD as Consulting Physician (Gastroenterology) Kalman Shan, MD as Consulting Physician (Pulmonary Disease)  This Provider for this visit: Treatment Team:  Attending Provider: Kalman Shan, MD  04/19/2022: Sudie Grumbling with [facility] NP for follow-up.  She actually came to see me around 4 weeks ago but she had yet to start on Ofev therapy and given her stability, we postpone her appointment.  Since then, she has started on her antifibrotic's; believes the date was around 11/30.  She has been tolerating these well.  Has not noticed any significant side effects.  Overall, she feels like her breathing is stable.  She actually feels like it has been a little bit better over the last few months.  She feels like she has been trying to work on her exercise and activity status.  She was able to go  shopping over Christmas and did not have any difficulty walking through the stores.  Her cough is very minimal, almost nonexistent.  She denies any wheezing, chest congestion, lower extremity swelling, orthopnea.  She is using budesonide inhaler twice daily.  Is not sure if this is made much of a difference for her.  She would like to see if she does okay using it less often.  Never uses her nebulizer treatments       06/14/2022: Today - follow up Patient presents today for follow up. She had PFTs today. Spirometry was stable. DLCO was declined when compared to 4 months ago but improved compared to a year ago. She feels stable to how she was last time she was here. She doesn't feel like she has much trouble with her breathing. She's able to go shopping now without any troubles. She has a rare, dry cough. She has been tolerating Ofev well. No GI side effects. She is curious if she has any food limitations on this. Otherwise, no concerns or complaints today.    12/26/2022 -   Chief Complaint  Patient presents with   Follow-up    PFT 12/26/22 SOB     August 2019 she suffered acute lung injury possibly Boop based on the nature of infiltrates and a high ESR and a positive ANA [BAL lymphocytosis].  There is some history of antifreeze exposure. -> ILD  Hx of env allergies - blood allergy test Aug 2021 - igE normal / but positive for Grass  Intermittent ANA positive - last +ve late 2020  Coronary artery calcification follows up with cardiology in any pain  HPI Octa Vondra 72 y.o. -presents with her son.  Interpreter had to leave because it was running 15-30 minutes late.  Son did the translation.  She called in approximately a month ago with significant amount of weight loss diarrhea nausea and also chest chest tightness.  We had her stop the nintedanib.  She is off it now she is gained her weight back no diarrhea no nausea.  Regarding this is an allergy.  In terms of her respiratory status she says  she is stable.  Pulmonary function test shows stability.  This only had a CT scan that showed decline back in the fall 2023.  No interim hospitalizations or ER visits or surgeries.  Symptom score appears stable.  Sit/stand test did not show any exertional desaturations.    SYMPTOM SCALE - ILD 06/11/2018   12/07/2019   01/25/2020 Last Weight  Most recent update: 01/25/2020  4:14 PM      Weight  65.1 kg (143 lb 9.6 oz)  7/7/2022145# 04/27/2021   07/26/2021 Pulmonary rehabilitation at [facility] since February 2023  -filled by interpreter [name] 02/07/2022   04/19/2022 06/14/2022 12/26/2022 Off ofev due to gi sidde ffects  O2 use ra ra ra ra ra ra ra ra ra ra  Shortness of Breath 0 -> 5 scale with 5 being worst (score 6 If unable to do)           0     At rest 0 0 0 0 0 2 0 0 0 0   Simple tasks - showers, clothes change, eating, shaving 0 3 0 0 0 2 0 0 0 1   Household (dishes, doing bed, laundry) 0 3 3 03 4 3 02 0 0 1   Shopping 1 4 3  03 0 1 0 0 0 0  Walking level at own pace 0 4 0 0 0 1 0 0 0 1   Walking stairs 0 6 4 4 2 3 4 1 1 3   Total (40 - 48) Dyspnea Score 1 20 10 10 6 12 6 1 1 6   How bad is your cough? 0 5 3 4 4 2 1 1 1 1   How bad is your fatigue 3 4 4 3 3 3  0 0 0 3  nausea   0 0 0 0 0 0 0 0 0  vomit   0 0 0 0 0 0 0 0 0  diarrhea   0 0 0 0 0 0 0 0 0  anzity   3 3 3  0 00 0  0 0 1  Dep[resssio   meds 0 0 0 0 0 0 0 0     Simple office walk 185 feet x  3 laps goal with forehead probe 02/19/2018   03/31/2018   06/11/2018   12/07/2019   01/25/2020   10/26/2020   04/27/2021   04/19/2022 12/26/2022   O2 used rooom air Room air Room air ra ra ra ra ra ra  Number laps completed 3 3 x 250 feet 3 x 236 feet 3 laops 3 3 3 3  Sit stand x 10  Comments about pace Moderate pace Mod pace Normal pace avg pace   Avg pace avg avg avg  Resting Pulse Ox/HR 99% and 71/min 100% and 89/min 100% and 83/mn 100% and 83/min 99% and 99/min 99% and 83 98% and 83 98% and 80 98% and 85  Final Pulse  Ox/HR 95% and 92/min 98% and 111 98% and 101/min 98% and 101/min 98% and 107/min 97% and 99 97% and 101 98% and 92 97% and 85  Desaturated </= 88% no no no no no non no no   Desaturated <= 3% points yes no no no none no no no   Got Tachycardic >/= 90/min yes yes yes yes yes yes yes no   Symptoms at end of test none none none Mild dyspnea none noe Mild dyspnea none   Miscellaneous comments x x x              CT Chest data from date: *HRCT 01/25/22  - personally visualized and independently interpreted : no - my findings are: per official report   IMPRESSION: 1. Progressive interstitial lung disease with imaging findings once again categorized as probable usual interstitial pneumonia (UIP), as above. 2. Aortic atherosclerosis, in addition to left main and three-vessel coronary artery disease. Assessment for potential risk factor modification, dietary therapy or pharmacologic therapy may be warranted, if clinically indicated. 3. Colonic diverticulosis.   Aortic Atherosclerosis (  ICD10-I70.0).     Electronically Signed   By: Trudie Reed M.D.   On: 01/26/2022 12:50   PFT     Latest Ref Rng & Units 12/26/2022    2:52 PM 06/14/2022    9:49 AM 02/07/2022    1:39 PM 07/26/2021    9:08 AM 04/27/2021   10:21 AM 12/10/2019   12:24 PM 06/11/2018   10:31 AM  ILD indicators  FVC-Pre L 2.18  P 2.12  2.04  2.05  1.98  2.13  2.05   FVC-Predicted Pre % 86  P 83  78  78  75  79  74   FVC-Post L      2.18    FVC-Predicted Post %      81    DLCO uncorrected ml/min/mmHg 12.58  P 11.51  14.38  11.06  10.43   13.22   DLCO UNC %Pred % 73  P 67  82  63  59   74   DLCO Corrected ml/min/mmHg 12.58  P 11.51  14.38  11.06  10.28     DLCO COR %Pred % 73  P 67  82  63  58       P Preliminary result      LAB RESULTS last 96 hours No results found.  LAB RESULTS last 90 days Recent Results (from the past 2160 hour(s))  Pulmonary Function Test     Status: None (Preliminary result)   Collection  Time: 12/26/22  2:52 PM  Result Value Ref Range   FVC-Pre 2.18 L   FVC-%Pred-Pre 86 %   FEV1-Pre 1.08 L   FEV1-%Pred-Pre 57 %   FEV6-Pre 2.16 L   FEV6-%Pred-Pre 90 %   Pre FEV1/FVC ratio 50 %   FEV1FVC-%Pred-Pre 65 %   Pre FEV6/FVC Ratio 100 %   FEV6FVC-%Pred-Pre 105 %   FEF 25-75 Pre 0.84 L/sec   FEF2575-%Pred-Pre 52 %   DLCO unc 12.58 ml/min/mmHg   DLCO unc % pred 73 %   DLCO cor 12.58 ml/min/mmHg   DLCO cor % pred 73 %   DL/VA 1.61 ml/min/mmHg/L   DL/VA % pred 95 %         has a past medical history of Acute respiratory failure (HCC), CAP (community acquired pneumonia), Hyperlipidemia, Hypertension, and Metabolic syndrome.   reports that she has never smoked. She has been exposed to tobacco smoke. She has never used smokeless tobacco.  Past Surgical History:  Procedure Laterality Date   CESAREAN SECTION     COLONOSCOPY N/A 06/07/2019   Procedure: COLONOSCOPY;  Surgeon: West Bali, MD;  [facility]: [facility];  Service: Endoscopy;  Laterality: N/A;  12:45   POLYPECTOMY  06/07/2019   Procedure: POLYPECTOMY;  Surgeon: West Bali, MD;  Location: [facility];  Service: Endoscopy;;    Allergies  Allergen Reactions   Hydromet [Hydrocodone Bit-Homatrop Mbr]     Pt doesn't remember   Toprol Xl [Metoprolol] Hives    Facial rash, itching, hives   Amoxicillin Rash    Did it involve swelling of the face/tongue/throat, SOB, or low BP? No Did it involve sudden or severe rash/hives, skin peeling, or any reaction on the inside of your mouth or nose? Unknown Did you need to seek medical attention at a hospital or doctor's office? Unknown When did it last happen?      10 + years If all above answers are "NO", may proceed with cephalosporin use.     Immunization History  Administered Date(s) Administered   Fluad Quad(high Dose 65+) 03/01/2019, 01/14/2020, 01/16/2021, 02/07/2022   Influenza, High Dose Seasonal PF 03/11/2017, 03/09/2018   Influenza,inj,Quad PF,6+ Mos  02/04/2014, 02/09/2015, 02/20/2016   Moderna Sars-Covid-2 Vaccination 05/21/2019, 06/18/2019   Pneumococcal Conjugate-13 08/10/2015   Pneumococcal Polysaccharide-23 06/17/2017, 12/21/2017   Tdap 12/31/2021   Zoster Recombinant(Shingrix) 09/21/2020, 06/21/2021    Family History  Problem Relation Age of Onset   Cancer Father    Healthy Brother    Healthy Daughter    Healthy Sister    Memory loss Sister    Healthy Son    Healthy Son    Breast cancer Niece      Current Outpatient Medications:    acetaminophen (TYLENOL) 500 MG tablet, Take 1 tablet (500 mg total) by mouth every 6 (six) hours as needed., Disp: 30 tablet, Rfl: 0   aspirin EC 81 MG tablet, Take 1 tablet (81 mg total) by mouth daily., Disp: 90 tablet, Rfl: 3   atorvastatin (LIPITOR) 20 MG tablet, Take 1 tablet (20 mg total) by mouth daily., Disp: 90 tablet, Rfl: 3   budesonide (PULMICORT) 180 MCG/ACT inhaler, Inhale 2 puffs into the lungs in the morning and at bedtime., Disp: 1 each, Rfl: 5   cetirizine (ZYRTEC) 10 MG tablet, TAKE 1 TABLET ONCE DAILY AS NEEDED FOR ALLERGIES., Disp: 30 tablet, Rfl: 5   diltiazem (CARDIZEM CD) 120 MG 24 hr capsule, take 1 capsule by mouth once daily., Disp: 90 capsule, Rfl: 0   hydrochlorothiazide (HYDRODIURIL) 25 MG tablet, take 1 tablet once daily., Disp: 30 tablet, Rfl: 1   ibuprofen (ADVIL) 200 MG tablet, Take 400 mg by mouth every 6 (six) hours as needed for headache or moderate pain., Disp: , Rfl:    ipratropium-albuterol (DUONEB) 0.5-2.5 (3) MG/3ML SOLN, Take 3 mLs by nebulization every 6 (six) hours as needed., Disp: , Rfl:    levothyroxine (SYNTHROID) 50 MCG tablet, TAKE 1 TABLET BY MOUTH DAILY BEFORE BREAKFAST., Disp: 30 tablet, Rfl: 7   losartan (COZAAR) 50 MG tablet, TAKE 1 TABLET ONCE DAILY., Disp: 90 tablet, Rfl: 2   Omega 3 1000 MG CAPS, Take 1,000 mg by mouth daily. , Disp: , Rfl:    ondansetron (ZOFRAN-ODT) 8 MG disintegrating tablet, Take 1 tablet (8 mg total) by mouth every  6 (six) hours as needed for nausea or vomiting., Disp: 20 tablet, Rfl: 1   pantoprazole (PROTONIX) 40 MG tablet, Take 1 tablet (40 mg total) by mouth daily., Disp: 90 tablet, Rfl: 3   potassium chloride SA (KLOR-CON M) 20 MEQ tablet, Take 1 tablet (20 mEq total) by mouth daily. **NEEDS TO BE SEEN BEFORE NEXT REFILL**, Disp: 30 tablet, Rfl: 0      Objective:   Vitals:   12/26/22 1536  BP: 122/78  Pulse: 84  Temp: 97.6 F (36.4 C)  TempSrc: Temporal  SpO2: 99%  Weight: 116 lb 12.8 oz (53 kg)  Height: 5\' 1"  (1.549 m)    Estimated body mass index is 22.07 kg/m as calculated from the following:   Height as of this encounter: 5\' 1"  (1.549 m).   Weight as of this encounter: 116 lb 12.8 oz (53 kg).  @WEIGHTCHANGE @  [employer]   12/26/22 1536  Weight: 116 lb 12.8 oz (53 kg)     Physical Exam   General: No distress. Looks well O2 at rest: no Cane present: no Sitting in wheel chair: no Frail: no Obese: no Neuro: Alert and Oriented x 3. GCS 15. Speech  normal Psych: Pleasant Resp:  Barrel Chest - no.  Wheeze - no, Crackles - yes , No overt respiratory distress CVS: Normal heart sounds. Murmurs - no Ext: Stigmata of Connective Tissue Disease - no HEENT: Normal upper airway. PEERL +. No post nasal drip        Assessment:       ICD-10-CM   1. Interstitial lung disease (HCC)  J84.9     2. ANA positive  R76.8     3. Abnormal LFTs  R79.89     4. Medication monitoring encounter  Z51.81     5. Drug-induced weight loss  R63.4    T50.905A     6. Diarrhea due to drug  K52.1          Plan:     Patient Instructions     ICD-10-CM   1. Interstitial lung disease (HCC)  J84.9     2. ANA positive  R76.8     3. Abnormal LFTs  R79.89     4. Medication monitoring encounter  Z51.81     5. Drug-induced weight loss  R63.4    T50.905A     6. Diarrhea due to drug  K52.1         - -BAL lymphocytosis August 2019 following antifreeze exposure and ANA  positivity    -ILD got worse in October 2023 CT scan of the chest but since then subjectively stable and pulmonary function test stable.  Nintedanib might of helped but it is caused significant weight loss and diarrhea and now stopped since early to mid August 2024.  -Glad side effects of stopped and her weight is improved after stopping nintedanib/Ofev  -Liver function last checked February 2024 was abnormal  Plan - NO MORE OFEV    -  cma to list it as allergy - weight loss, diarrhea, nausea  -Given recent recovery from side effects and also current stability with pulmonary function test will hold off on recommending pirfenidone the other antifibrotic  -Check Lover function test today 12/26/2022  -Do high-resolution CT chest in 4 months  Follow-up - 4 months with Dr. Marchelle Gearing following CT scan of the chest  -If further progression then we will recommend second antifibrotic.  -ILD symptom score and simple walking desaturation test at the time of follow-up  -  Interpreter needed at follow-up   FOLLOWUP Return in about 4 months (around 04/27/2023) for ILD, with Dr Marchelle Gearing, Face to Face Visit, after HRCT chest.    SIGNATURE    Dr. Kalman Shan, M.D., F.C.C.P,  Pulmonary and Critical Care Medicine Staff Physician, [facility] Director - Interstitial Lung Disease  Program  Pulmonary Fibrosis [facility] at [facility] [place], [place], [address]  Pager: [phone], If no answer or between  15:00h - 7:00h: call [phone] Telephone: [phone]  4:25 PM 12/26/2022

## 2022-12-27 LAB — HEPATIC FUNCTION PANEL
ALT: 52 U/L — ABNORMAL HIGH (ref 0–35)
AST: 50 U/L — ABNORMAL HIGH (ref 0–37)
Albumin: 4.1 g/dL (ref 3.5–5.2)
Alkaline Phosphatase: 55 U/L (ref 39–117)
Bilirubin, Direct: 0.1 mg/dL (ref 0.0–0.3)
Total Bilirubin: 0.4 mg/dL (ref 0.2–1.2)
Total Protein: 7.2 g/dL (ref 6.0–8.3)

## 2023-01-01 ENCOUNTER — Encounter: Payer: Self-pay | Admitting: Family Medicine

## 2023-01-01 ENCOUNTER — Ambulatory Visit (INDEPENDENT_AMBULATORY_CARE_PROVIDER_SITE_OTHER): Payer: Medicare Other | Admitting: Family Medicine

## 2023-01-01 VITALS — BP 110/65 | HR 75 | Ht 61.0 in | Wt 117.0 lb

## 2023-01-01 DIAGNOSIS — I10 Essential (primary) hypertension: Secondary | ICD-10-CM

## 2023-01-01 DIAGNOSIS — J4520 Mild intermittent asthma, uncomplicated: Secondary | ICD-10-CM

## 2023-01-01 DIAGNOSIS — R7303 Prediabetes: Secondary | ICD-10-CM

## 2023-01-01 DIAGNOSIS — E785 Hyperlipidemia, unspecified: Secondary | ICD-10-CM | POA: Diagnosis not present

## 2023-01-01 DIAGNOSIS — Z23 Encounter for immunization: Secondary | ICD-10-CM

## 2023-01-01 DIAGNOSIS — E039 Hypothyroidism, unspecified: Secondary | ICD-10-CM

## 2023-01-01 LAB — BAYER DCA HB A1C WAIVED: HB A1C (BAYER DCA - WAIVED): 5 % (ref 4.8–5.6)

## 2023-01-01 MED ORDER — HYDROCHLOROTHIAZIDE 25 MG PO TABS
25.0000 mg | ORAL_TABLET | Freq: Every day | ORAL | 1 refills | Status: DC
Start: 2023-01-01 — End: 2023-07-04

## 2023-01-01 MED ORDER — LEVOTHYROXINE SODIUM 50 MCG PO TABS
50.0000 ug | ORAL_TABLET | Freq: Every day | ORAL | 1 refills | Status: DC
Start: 2023-01-01 — End: 2023-07-04

## 2023-01-01 MED ORDER — CETIRIZINE HCL 10 MG PO TABS
10.0000 mg | ORAL_TABLET | Freq: Every day | ORAL | 1 refills | Status: DC | PRN
Start: 2023-01-01 — End: 2023-07-04

## 2023-01-01 MED ORDER — DILTIAZEM HCL ER COATED BEADS 120 MG PO CP24
120.0000 mg | ORAL_CAPSULE | Freq: Every day | ORAL | 1 refills | Status: DC
Start: 2023-01-01 — End: 2023-07-04

## 2023-01-01 MED ORDER — BUDESONIDE 180 MCG/ACT IN AEPB
2.0000 | INHALATION_SPRAY | Freq: Two times a day (BID) | RESPIRATORY_TRACT | 5 refills | Status: DC
Start: 2023-01-01 — End: 2024-01-12

## 2023-01-01 MED ORDER — POTASSIUM CHLORIDE CRYS ER 20 MEQ PO TBCR: 20.0000 meq | EXTENDED_RELEASE_TABLET | Freq: Every day | ORAL | 1 refills | Status: DC

## 2023-01-01 NOTE — Progress Notes (Signed)
BP 110/65   Pulse 75   Ht 5\' 1"  (1.549 m)   Wt 117 lb (53.1 kg)   SpO2 96%   BMI 22.11 kg/m    Subjective:   [name], female    DOB: [birth year], 72 y.o.   MRN: [id number]  HPI: Tasha [name] is a 72 y.o. female presenting on 01/01/2023 for Medical Management of Chronic Issues, Hypertension, Hypothyroidism, and Prediabetes   HPI Prediabetes Patient comes in today for recheck of his diabetes. Patient has been currently taking no medicine, diet control. Patient is currently on an ACE inhibitor/ARB. Patient has not seen an ophthalmologist this year. Patient denies any new issues with their feet. The symptom started onset as an adult hypertension and hypothyroidism ARE RELATED TO DM   Hypertension and A-fib Patient is currently on losartan and diltiazem, and their blood pressure today is 110/65. Patient denies any lightheadedness or dizziness. Patient denies headaches, blurred vision, chest pains, shortness of breath, or weakness. Denies any side effects from medication and is content with current medication.   Hypothyroidism recheck Patient is coming in for thyroid recheck today as well. They deny any issues with hair changes or heat or cold problems or diarrhea or constipation. They deny any chest pain or palpitations. They are currently on levothyroxine 50 micrograms   Patient has allergy induced asthma and uses budesonide when she needs it she was seeing pulmonology for this before but she does need to 2 options they said she did not need to come back unless it picked up.  Relevant past medical, surgical, family and social history reviewed and updated as indicated. Interim medical history since our last visit reviewed. Allergies and medications reviewed and updated.  Review of Systems  Constitutional:  Negative for chills and fever.  Eyes:  Negative for redness and visual disturbance.  Respiratory:  Positive for wheezing (Occasional, worse in the wintertime).  Negative for chest tightness and shortness of breath.   Cardiovascular:  Negative for chest pain and leg swelling.  Genitourinary:  Negative for difficulty urinating and dysuria.  Musculoskeletal:  Negative for back pain and gait problem.  Skin:  Negative for rash.  Neurological:  Negative for dizziness, light-headedness and headaches.  Psychiatric/Behavioral:  Negative for agitation and behavioral problems.   All other systems reviewed and are negative.   Per HPI unless specifically indicated above   Allergies as of 01/01/2023       Reactions   Hydromet [hydrocodone Bit-homatrop Mbr]    Pt doesn't remember   Toprol Xl [metoprolol] Hives   Facial rash, itching, hives   Amoxicillin Rash   Did it involve swelling of the face/tongue/throat, SOB, or low BP? No Did it involve sudden or severe rash/hives, skin peeling, or any reaction on the inside of your mouth or nose? Unknown Did you need to seek medical attention at a hospital or doctor's office? Unknown When did it last happen?      10 + years If all above answers are "NO", may proceed with cephalosporin use.        Medication List        Accurate as of January 01, 2023  1:46 PM. If you have any questions, ask your nurse or doctor.          acetaminophen 500 MG tablet Commonly known as: TYLENOL Take 1 tablet (500 mg total) by mouth every 6 (six) hours as needed.   aspirin EC 81 MG tablet Take 1 tablet (81 mg total)  by mouth daily.   atorvastatin 20 MG tablet Commonly known as: LIPITOR Take 1 tablet (20 mg total) by mouth daily.   budesonide 180 MCG/ACT inhaler Commonly known as: PULMICORT Inhale 2 puffs into the lungs in the morning and at bedtime.   cetirizine 10 MG tablet Commonly known as: ZYRTEC Take 1 tablet (10 mg total) by mouth daily as needed for allergies. What changed: See the new instructions. Changed by: [name] Tameron Lama   diltiazem 120 MG 24 hr capsule Commonly known as: CARDIZEM CD Take  1 capsule (120 mg total) by mouth daily.   hydrochlorothiazide 25 MG tablet Commonly known as: HYDRODIURIL Take 1 tablet (25 mg total) by mouth daily.   ibuprofen 200 MG tablet Commonly known as: ADVIL Take 400 mg by mouth every 6 (six) hours as needed for headache or moderate pain.   ipratropium-albuterol 0.5-2.5 (3) MG/3ML Soln Commonly known as: DUONEB Take 3 mLs by nebulization every 6 (six) hours as needed.   levothyroxine 50 MCG tablet Commonly known as: SYNTHROID Take 1 tablet (50 mcg total) by mouth daily before breakfast.   losartan 50 MG tablet Commonly known as: COZAAR TAKE 1 TABLET ONCE DAILY.   Omega 3 1000 MG Caps Take 1,000 mg by mouth daily.   ondansetron 8 MG disintegrating tablet Commonly known as: ZOFRAN-ODT Take 1 tablet (8 mg total) by mouth every 6 (six) hours as needed for nausea or vomiting.   pantoprazole 40 MG tablet Commonly known as: PROTONIX Take 1 tablet (40 mg total) by mouth daily.   potassium chloride SA 20 MEQ tablet Commonly known as: KLOR-CON M Take 1 tablet (20 mEq total) by mouth daily. What changed: additional instructions Changed by: [name] Jasper Ruminski         Objective:   BP 110/65   Pulse 75   Ht 5\' 1"  (1.549 m)   Wt 117 lb (53.1 kg)   SpO2 96%   BMI 22.11 kg/m   Wt Readings from Last 3 Encounters:  01/01/23 117 lb (53.1 kg)  12/26/22 116 lb 12.8 oz (53 kg)  09/26/22 122 lb (55.3 kg)    Physical Exam Vitals and nursing note reviewed.  Constitutional:      General: She is not in acute distress.    Appearance: She is well-developed. She is not diaphoretic.  Eyes:     Conjunctiva/sclera: Conjunctivae normal.  Cardiovascular:     Rate and Rhythm: Normal rate and regular rhythm.     Heart sounds: Normal heart sounds. No murmur heard. Pulmonary:     Effort: Pulmonary effort is normal. No respiratory distress.     Breath sounds: Normal breath sounds. No wheezing.  Musculoskeletal:        General: No swelling or  tenderness. Normal range of motion.  Skin:    General: Skin is warm and dry.     Findings: No rash.  Neurological:     Mental Status: She is alert and oriented to person, place, and time.     Coordination: Coordination normal.  Psychiatric:        Behavior: Behavior normal.       Assessment & Plan:   Problem List Items Addressed This Visit       Cardiovascular and Mediastinum   Hypertension (Chronic)   Relevant Medications   diltiazem (CARDIZEM CD) 120 MG 24 hr capsule   hydrochlorothiazide (HYDRODIURIL) 25 MG tablet     Respiratory   Allergy-induced asthma   Relevant Medications   cetirizine (ZYRTEC)  10 MG tablet   budesonide (PULMICORT) 180 MCG/ACT inhaler     Endocrine   Hypothyroidism   Relevant Medications   levothyroxine (SYNTHROID) 50 MCG tablet     Other   Hyperlipidemia with target LDL less than 100 (Chronic)   Relevant Medications   diltiazem (CARDIZEM CD) 120 MG 24 hr capsule   hydrochlorothiazide (HYDRODIURIL) 25 MG tablet   Prediabetes - Primary   Relevant Orders   CBC with Differential/Platelet   Bayer DCA Hb A1c Waived   Other Visit Diagnoses     Essential hypertension       Relevant Medications   diltiazem (CARDIZEM CD) 120 MG 24 hr capsule   hydrochlorothiazide (HYDRODIURIL) 25 MG tablet       Continue current medicine, seems to be doing well, gave refills for inhaler.  A1c looks good at 5.0. Follow up plan: Return in about 3 months (around 04/02/2023), or if symptoms worsen or fail to improve, for Prediabetes and hypothyroidism and hypertension.  Counseling provided for all of the vaccine components Orders Placed This Encounter  Procedures   CBC with Differential/Platelet   Bayer DCA Hb A1c Waived    Arville Care, MD [facility] 01/01/2023, 1:46 PM

## 2023-01-02 ENCOUNTER — Telehealth: Payer: Self-pay | Admitting: Internal Medicine

## 2023-01-02 DIAGNOSIS — R7989 Other specified abnormal findings of blood chemistry: Secondary | ICD-10-CM

## 2023-01-02 LAB — CBC WITH DIFFERENTIAL/PLATELET
Basophils Absolute: 0 10*3/uL (ref 0.0–0.2)
Basos: 0 %
EOS (ABSOLUTE): 0.2 10*3/uL (ref 0.0–0.4)
Eos: 3 %
Hematocrit: 36.2 % (ref 34.0–46.6)
Hemoglobin: 11.5 g/dL (ref 11.1–15.9)
Immature Grans (Abs): 0 10*3/uL (ref 0.0–0.1)
Immature Granulocytes: 0 %
Lymphocytes Absolute: 1.5 10*3/uL (ref 0.7–3.1)
Lymphs: 21 %
MCH: 30.7 pg (ref 26.6–33.0)
MCHC: 31.8 g/dL (ref 31.5–35.7)
MCV: 97 fL (ref 79–97)
Monocytes Absolute: 0.9 10*3/uL (ref 0.1–0.9)
Monocytes: 12 %
Neutrophils Absolute: 4.5 10*3/uL (ref 1.4–7.0)
Neutrophils: 64 %
Platelets: 114 10*3/uL — ABNORMAL LOW (ref 150–450)
RBC: 3.75 x10E6/uL — ABNORMAL LOW (ref 3.77–5.28)
RDW: 12.9 % (ref 11.7–15.4)
WBC: 7 10*3/uL (ref 3.4–10.8)

## 2023-01-02 NOTE — Telephone Encounter (Signed)
LFTs chronically high x 3 years RUQ [place] normal  Plan  -refer GI if they have never seen one before    Latest Reference Range & Units 09/23/12 13:35 05/04/13 15:28 02/04/14 16:05 08/10/14 08:52 11/09/14 11:31 02/09/15 11:11 08/10/15 12:02 02/20/16 10:00 07/11/16 15:36 06/17/17 10:19 12/17/17 03:24 12/20/17 02:56 12/29/17 04:10 12/30/17 05:09 12/31/17 04:40 01/05/18 06:58 01/09/18 05:45 01/21/18 12:48 03/18/18 14:37 11/25/18 09:57 12/09/18 14:42 01/13/19 16:56 04/14/19 09:13 10/18/19 11:09 03/23/20 10:42 09/06/20 14:29 03/23/21 09:53 09/21/21 10:56 12/27/21 13:05 02/07/22 15:31 04/19/22 10:35 05/21/22 09:00 06/14/22 10:59 12/26/22 16:30  AST 0 - 37 U/L 23 21 20 17 20 21 22 18 22 20 30 19 24 31  36 32 25 17 33 32 28 33 30 41  (H) 36 46 (H) 75 (H) 73 (H) 65 (H) 68 (H) 46 (H) 48 (H) 54 (H) 50 (H)  ALT 0 - 35 U/L 21 19 17 12 14 16 22 16 18 19 17 21  37 57 (H) 68 (H) 74 (H) 51 (H) 26 38 (H) 30 26 36 (H) 30 43 (H) 44 (H) 56 (H) 97 (H) 95 (H) 89 (H) 91 (H) 57 (H) 55 (H) 65 (H) 52 (H)  (H): Data is abnormally high

## 2023-01-03 NOTE — Telephone Encounter (Signed)
Referral for GI placed nothing further needed.

## 2023-02-26 ENCOUNTER — Ambulatory Visit: Payer: Medicare Other

## 2023-02-26 VITALS — Ht 62.0 in | Wt 120.0 lb

## 2023-02-26 DIAGNOSIS — Z0000 Encounter for general adult medical examination without abnormal findings: Secondary | ICD-10-CM | POA: Diagnosis not present

## 2023-02-26 NOTE — Progress Notes (Signed)
Subjective:   Tasha [name] is a 72 y.o. female who presents for Medicare Annual (Subsequent) preventive examination.  Visit Complete: Virtual I connected with  Nitza Leaks on 02/26/23 by a audio enabled telemedicine application and verified that I am speaking with the correct person using two identifiers.  Patient Location: Home  Provider Location: Home Office  I discussed the limitations of evaluation and management by telemedicine. The patient expressed understanding and agreed to proceed.  Vital Signs: Because this visit was a virtual/telehealth visit, some criteria may be missing or patient reported. Any vitals not documented were not able to be obtained and vitals that have been documented are patient reported.  Patient Medicare AWV questionnaire was completed by the patient on 02/26/2023; I have confirmed that all information answered by patient is correct and no changes since this date.  Cardiac Risk Factors include: advanced age (>70men, >2 women);dyslipidemia;hypertension     Objective:    Today's Vitals   02/26/23 0841  Weight: 120 lb (54.4 kg)  Height: 5\' 2"  (1.575 m)   Body mass index is 21.95 kg/m.     02/26/2023    8:51 AM 05/29/2021   12:57 PM 09/22/2020    8:50 AM 06/07/2019   12:26 PM 12/10/2018    2:43 PM 12/16/2017    2:12 PM  Advanced Directives  Does Patient Have a Medical Advance Directive? Yes No No No No No  Type of Estate agent of [place];Living will       Copy of Healthcare Power of Attorney in Chart? No - copy requested       Would patient like information on creating a medical advance directive?  No - Patient declined No - Patient declined No - Patient declined No - Patient declined No - Patient declined    Current Medications (verified) Outpatient Encounter Medications as of 02/26/2023  Medication Sig   acetaminophen (TYLENOL) 500 MG tablet Take 1 tablet (500 mg total) by mouth every 6 (six) hours as needed.    aspirin EC 81 MG tablet Take 1 tablet (81 mg total) by mouth daily.   atorvastatin (LIPITOR) 20 MG tablet Take 1 tablet (20 mg total) by mouth daily.   budesonide (PULMICORT) 180 MCG/ACT inhaler Inhale 2 puffs into the lungs in the morning and at bedtime.   cetirizine (ZYRTEC) 10 MG tablet Take 1 tablet (10 mg total) by mouth daily as needed for allergies.   diltiazem (CARDIZEM CD) 120 MG 24 hr capsule Take 1 capsule (120 mg total) by mouth daily.   hydrochlorothiazide (HYDRODIURIL) 25 MG tablet Take 1 tablet (25 mg total) by mouth daily.   ibuprofen (ADVIL) 200 MG tablet Take 400 mg by mouth every 6 (six) hours as needed for headache or moderate pain.   ipratropium-albuterol (DUONEB) 0.5-2.5 (3) MG/3ML SOLN Take 3 mLs by nebulization every 6 (six) hours as needed.   levothyroxine (SYNTHROID) 50 MCG tablet Take 1 tablet (50 mcg total) by mouth daily before breakfast.   losartan (COZAAR) 50 MG tablet TAKE 1 TABLET ONCE DAILY.   Omega 3 1000 MG CAPS Take 1,000 mg by mouth daily.    ondansetron (ZOFRAN-ODT) 8 MG disintegrating tablet Take 1 tablet (8 mg total) by mouth every 6 (six) hours as needed for nausea or vomiting.   pantoprazole (PROTONIX) 40 MG tablet Take 1 tablet (40 mg total) by mouth daily.   potassium chloride SA (KLOR-CON M) 20 MEQ tablet Take 1 tablet (20 mEq total) by mouth daily.  No facility-administered encounter medications on file as of 02/26/2023.    Allergies (verified) Hydromet [hydrocodone bit-homatrop mbr], Toprol xl [metoprolol], and Amoxicillin   History: Past Medical History:  Diagnosis Date   Acute respiratory failure (HCC)    CAP (community acquired pneumonia)    Hyperlipidemia    Hypertension    Metabolic syndrome    Past Surgical History:  Procedure Laterality Date   CESAREAN SECTION     COLONOSCOPY N/A 06/07/2019   Procedure: COLONOSCOPY;  Surgeon: West Bali, MD;  Location: [facility];  Service: Endoscopy;  Laterality: N/A;  12:45    POLYPECTOMY  06/07/2019   Procedure: POLYPECTOMY;  Surgeon: West Bali, MD;  Location: [facility];  Service: Endoscopy;;   Family History  Problem Relation Age of Onset   Cancer Father    Healthy Brother    Healthy Daughter    Healthy Sister    Memory loss Sister    Healthy Son    Healthy Son    Breast cancer Niece    Social History   Socioeconomic History   Marital status: Married    Spouse name: Ramiro   Number of children: 3   Years of education: 6   Highest education level: 6th grade  Occupational History   Occupation: retired  Tobacco Use   Smoking status: Never    Passive exposure: Past   Smokeless tobacco: Never  Vaping Use   Vaping status: Never Used  Substance and Sexual Activity   Alcohol use: No   Drug use: No   Sexual activity: Not Currently    Birth control/protection: Post-menopausal  Other Topics Concern   Not on file  Social History Narrative   Lives home with her husband - active in church - children live nearby   Social Determinants of Health   Financial Resource Strain: Low Risk  (02/26/2023)   Overall Financial Resource Strain (CARDIA)    Difficulty of Paying Living Expenses: Not hard at all  Food Insecurity: No Food Insecurity (02/26/2023)   Hunger Vital Sign    Worried About Running Out of Food in the Last Year: Never true    Ran Out of Food in the Last Year: Never true  Transportation Needs: No Transportation Needs (02/26/2023)   PRAPARE - Administrator, Civil Service (Medical): No    Lack of Transportation (Non-Medical): No  Physical Activity: Insufficiently Active (02/26/2023)   Exercise Vital Sign    Days of Exercise per Week: 3 days    Minutes of Exercise per Session: 30 min  Stress: No Stress Concern Present (02/26/2023)   [employer] of Occupational Health - Occupational Stress Questionnaire    Feeling of Stress : Not at all  Social Connections: Socially Integrated (02/26/2023)   Social Connection and  Isolation Panel [NHANES]    Frequency of Communication with Friends and Family: More than three times a week    Frequency of Social Gatherings with Friends and Family: More than three times a week    Attends Religious Services: More than 4 times per year    Active Member of [employer] or Organizations: Yes    Attends Engineer, structural: More than 4 times per year    Marital Status: Married    Tobacco Counseling Counseling given: Not Answered   Clinical Intake:  Pre-visit preparation completed: Yes  Pain : No/denies pain     Nutritional Risks: None Diabetes: No  How often do you need to have someone help you  when you read instructions, pamphlets, or other written materials from your doctor or pharmacy?: 1 - Never  Interpreter Needed?: No  Information entered by :: Renie Ora, LPN   Activities of Daily Living    02/26/2023    8:51 AM  In your present state of health, do you have any difficulty performing the following activities:  Hearing? 0  Vision? 0  Difficulty concentrating or making decisions? 0  Walking or climbing stairs? 0  Dressing or bathing? 0  Doing errands, shopping? 0  Preparing Food and eating ? N  Using the Toilet? N  In the past six months, have you accidently leaked urine? N  Do you have problems with loss of bowel control? N  Managing your Medications? N  Managing your Finances? N  Housekeeping or managing your Housekeeping? N    Patient Care Team: Dettinger, [name], MD as PCP - General (Family Medicine) Mallipeddi, Orion Modest, MD as PCP - Cardiology (Cardiology) Jena Gauss Gerrit Friends, MD as Consulting Physician (Gastroenterology) Kalman Shan, MD as Consulting Physician (Pulmonary Disease)  Indicate any recent Medical Services you may have received from other than [facility] providers in the past year (date may be approximate).     Assessment:   This is a routine wellness examination for Analynn.  Hearing/Vision screen Vision Screening  - Comments:: Wears rx glasses - up to date with routine eye exams with  Dr.Le    Goals Addressed             This Visit's Progress    DIET - INCREASE WATER INTAKE   On track    Try to drink 6-8 glasses of water daily.       Depression Screen    02/26/2023    8:47 AM 01/01/2023    1:17 PM 09/26/2022    3:27 PM 07/31/2022    2:30 PM 06/20/2022    1:11 PM 12/27/2021   11:17 AM 10/03/2021    2:08 PM  PHQ 2/9 Scores  PHQ - 2 Score 0 0  0 0  0  PHQ- 9 Score 0 0  0 0  0  Exception Documentation   Patient refusal   Patient refusal     Fall Risk    02/26/2023    8:43 AM 01/01/2023    1:17 PM 07/31/2022    2:30 PM 07/09/2022    9:02 AM 06/20/2022    1:11 PM  Fall Risk   Falls in the past year? 0 0 0 0 0  Number falls in past yr: 0      Injury with Fall? 0      Risk for fall due to : No Fall Risks      Follow up Falls prevention discussed   Falls evaluation completed     MEDICARE RISK AT HOME: Medicare Risk at Home Any stairs in or around the home?: Yes If so, are there any without handrails?: No Home free of loose throw rugs in walkways, pet beds, electrical cords, etc?: Yes Adequate lighting in your home to reduce risk of falls?: Yes Life alert?: No Use of a cane, walker or w/c?: No Grab bars in the bathroom?: Yes Shower chair or bench in shower?: Yes Elevated toilet seat or a handicapped toilet?: Yes  TIMED UP AND GO:  Was the test performed?  No    Cognitive Function:    06/18/2017    9:04 AM  MMSE - Mini Mental State Exam  Orientation to time 4  Orientation to  Place 4  Registration 3  Recall 3  Language- name 2 objects 1  Language- repeat 1  Copy design 1        02/26/2023    8:51 AM 12/10/2018    2:49 PM  6CIT Screen  What Year? 0 points 0 points  What month? 0 points 0 points  What time? 0 points 0 points  Count back from 20 0 points 4 points  Months in reverse 0 points 0 points  Repeat phrase 0 points 4 points  Total Score 0 points 8 points     Immunizations Immunization History  Administered Date(s) Administered   Fluad Quad(high Dose 65+) 03/01/2019, 01/14/2020, 01/16/2021, 02/07/2022   Fluad Trivalent(High Dose 65+) 01/01/2023   Influenza, High Dose Seasonal PF 03/11/2017, 03/09/2018   Influenza,inj,Quad PF,6+ Mos 02/04/2014, 02/09/2015, 02/20/2016   Moderna Sars-Covid-2 Vaccination 05/21/2019, 06/18/2019   Pneumococcal Conjugate-13 08/10/2015   Pneumococcal Polysaccharide-23 06/17/2017, 12/21/2017   Tdap 12/31/2021   Zoster Recombinant(Shingrix) 09/21/2020, 06/21/2021    TDAP status: Up to date  Flu Vaccine status: Due, Education has been provided regarding the importance of this vaccine. Advised may receive this vaccine at local pharmacy or Health Dept. Aware to provide a copy of the vaccination record if obtained from local pharmacy or Health Dept. Verbalized acceptance and understanding.  Pneumococcal vaccine status: Up to date  Covid-19 vaccine status: Completed vaccines  Qualifies for Shingles Vaccine? Yes   Zostavax completed No   Shingrix Completed?: No.    Education has been provided regarding the importance of this vaccine. Patient has been advised to call insurance company to determine out of pocket expense if they have not yet received this vaccine. Advised may also receive vaccine at local pharmacy or Health Dept. Verbalized acceptance and understanding.  Screening Tests Health Maintenance  Topic Date Due   MAMMOGRAM  05/21/2023   Medicare Annual Wellness (AWV)  02/26/2024   DEXA SCAN  03/24/2025   Colonoscopy  06/06/2029   DTaP/Tdap/Td (2 - Td or Tdap) 01/01/2032   Pneumonia Vaccine 62+ Years old  Completed   INFLUENZA VACCINE  Completed   Hepatitis C Screening  Completed   Zoster Vaccines- Shingrix  Completed   HPV VACCINES  Aged Out   COLON CANCER SCREENING ANNUAL FOBT  Discontinued   COVID-19 Vaccine  Discontinued    Health Maintenance  There are no preventive care reminders to display  for this patient.   Colorectal cancer screening: Type of screening: Colonoscopy. Completed 06/07/2019. Repeat every 10 years  Mammogram status: Completed 05/20/2022. Repeat every year  Bone Density status: Completed 03/24/2020. Results reflect: Bone density results: OSTEOPENIA. Repeat every 5 years.  Lung Cancer Screening: (Low Dose CT Chest recommended if Age 81-80 years, 20 pack-year currently smoking OR have quit w/in 15years.) does not qualify.   Lung Cancer Screening Referral: n/a  Additional Screening:  Hepatitis C Screening: does not qualify; Completed 02/20/2020  Vision Screening: Recommended annual ophthalmology exams for early detection of glaucoma and other disorders of the eye. Is the patient up to date with their annual eye exam?  Yes  Who is the provider or what is the name of the office in which the patient attends annual eye exams? Dr.Le  If pt is not established with a provider, would they like to be referred to a provider to establish care? No .   Dental Screening: Recommended annual dental exams for proper oral hygiene   Community Resource Referral / Chronic Care Management: CRR required this visit?  No  CCM required this visit?  No     Plan:     I have personally reviewed and noted the following in the patient's chart:   Medical and social history Use of alcohol, tobacco or illicit drugs  Current medications and supplements including opioid prescriptions. Patient is not currently taking opioid prescriptions. Functional ability and status Nutritional status Physical activity Advanced directives List of other physicians Hospitalizations, surgeries, and ER visits in previous 12 months Vitals Screenings to include cognitive, depression, and falls Referrals and appointments  In addition, I have reviewed and discussed with patient certain preventive protocols, quality metrics, and best practice recommendations. A written personalized care plan for  preventive services as well as general preventive health recommendations were provided to patient.     Lorrene Reid, LPN   27/05/5364   After Visit Summary: (MyChart) Due to this being a telephonic visit, the after visit summary with patients personalized plan was offered to patient via MyChart   Nurse Notes: Theron Arista Number [id number]

## 2023-02-26 NOTE — Patient Instructions (Signed)
Ms. Ytuarte , Thank you for taking time to come for your Medicare Wellness Visit. I appreciate your ongoing commitment to your health goals. Please review the following plan we discussed and let me know if I can assist you in the future.   Referrals/Orders/Follow-Ups/Clinician Recommendations: Aim for 30 minutes of exercise or brisk walking, 6-8 glasses of water, and 5 servings of fruits and vegetables each day.   This is a list of the screening recommended for you and due dates:  Health Maintenance  Topic Date Due   Mammogram  05/21/2023   Medicare Annual Wellness Visit  02/26/2024   DEXA scan (bone density measurement)  03/24/2025   Colon Cancer Screening  06/06/2029   DTaP/Tdap/Td vaccine (2 - Td or Tdap) 01/01/2032   Pneumonia Vaccine  Completed   Flu Shot  Completed   Hepatitis C Screening  Completed   Zoster (Shingles) Vaccine  Completed   HPV Vaccine  Aged Out   Stool Blood Test  Discontinued   COVID-19 Vaccine  Discontinued    Advanced directives: (Copy Requested) Please bring a copy of your health care power of attorney and living will to the office to be added to your chart at your convenience.  Next Medicare Annual Wellness Visit scheduled for next year: Yes  Insert Preventive Care attachment Insert FALL PREVENTION attachment if needed

## 2023-02-28 ENCOUNTER — Encounter: Payer: Self-pay | Admitting: Internal Medicine

## 2023-02-28 ENCOUNTER — Ambulatory Visit: Payer: Medicare Other | Attending: Internal Medicine | Admitting: Internal Medicine

## 2023-02-28 NOTE — Progress Notes (Signed)
Erroneous encounter - please disregard.

## 2023-03-25 ENCOUNTER — Other Ambulatory Visit: Payer: Self-pay | Admitting: Family Medicine

## 2023-03-25 DIAGNOSIS — R7303 Prediabetes: Secondary | ICD-10-CM

## 2023-04-02 ENCOUNTER — Ambulatory Visit: Payer: Medicare Other | Admitting: Family Medicine

## 2023-04-02 ENCOUNTER — Encounter: Payer: Self-pay | Admitting: Family Medicine

## 2023-04-02 VITALS — BP 121/66 | HR 81 | Temp 98.4°F | Resp 20 | Ht 62.0 in | Wt 125.2 lb

## 2023-04-02 DIAGNOSIS — R7303 Prediabetes: Secondary | ICD-10-CM | POA: Diagnosis not present

## 2023-04-02 DIAGNOSIS — E039 Hypothyroidism, unspecified: Secondary | ICD-10-CM

## 2023-04-02 DIAGNOSIS — E785 Hyperlipidemia, unspecified: Secondary | ICD-10-CM

## 2023-04-02 DIAGNOSIS — I10 Essential (primary) hypertension: Secondary | ICD-10-CM

## 2023-04-02 LAB — BAYER DCA HB A1C WAIVED: HB A1C (BAYER DCA - WAIVED): 5.1 % (ref 4.8–5.6)

## 2023-04-02 MED ORDER — LOSARTAN POTASSIUM 50 MG PO TABS: 50.0000 mg | ORAL_TABLET | Freq: Every day | ORAL | 3 refills | Status: DC

## 2023-04-02 NOTE — Progress Notes (Signed)
BP 121/66   Pulse 81   Temp 98.4 F (36.9 C) (Temporal)   Resp 20   Ht 5\' 2"  (1.575 m)   Wt 125 lb 3.2 oz (56.8 kg)   SpO2 98%   BMI 22.90 kg/m    Subjective:   [name], female    DOB: April 11, 1951, 72 y.o.   MRN: [id number]  HPI: Tasha [name] is a 72 y.o. female presenting on 04/02/2023 for Medication Management, Prediabetes, Hypertension, and Hypothyroidism   HPI Prediabetes Patient comes in today for recheck of his diabetes. Patient has been currently taking no medicine currently, diet control. Patient is currently on an ACE inhibitor/ARB. Patient has seen an ophthalmologist this year. Patient denies any new issues with their feet. The symptom started onset as an adult hypertension and hyperlipidemia ARE RELATED TO DM   Hypertension Patient is currently on diltiazem and hydrochlorothiazide and losartan, and their blood pressure today is 121/66. Patient denies any lightheadedness or dizziness. Patient denies headaches, blurred vision, chest pains, shortness of breath, or weakness. Denies any side effects from medication and is content with current medication.   Hyperlipidemia Patient is coming in for recheck of his hyperlipidemia. The patient is currently taking atorvastatin and fish oils. They deny any issues with myalgias or history of liver damage from it. They deny any focal numbness or weakness or chest pain.   Relevant past medical, surgical, family and social history reviewed and updated as indicated. Interim medical history since our last visit reviewed. Allergies and medications reviewed and updated.  Review of Systems  Constitutional:  Negative for chills and fever.  HENT:  Negative for congestion, ear discharge and ear pain.   Eyes:  Negative for visual disturbance.  Respiratory:  Negative for chest tightness and shortness of breath.   Cardiovascular:  Negative for chest pain and leg swelling.  Genitourinary:  Negative for difficulty urinating  and dysuria.  Musculoskeletal:  Negative for back pain and gait problem.  Skin:  Negative for rash.  Neurological:  Negative for dizziness, light-headedness and headaches.  Psychiatric/Behavioral:  Negative for agitation and behavioral problems.   All other systems reviewed and are negative.   Per HPI unless specifically indicated above   Allergies as of 04/02/2023       Reactions   Hydromet [hydrocodone Bit-homatrop Mbr]    Pt doesn't remember   Toprol Xl [metoprolol] Hives   Facial rash, itching, hives   Amoxicillin Rash   Did it involve swelling of the face/tongue/throat, SOB, or low BP? No Did it involve sudden or severe rash/hives, skin peeling, or any reaction on the inside of your mouth or nose? Unknown Did you need to seek medical attention at a hospital or doctor's office? Unknown When did it last happen?      10 + years If all above answers are "NO", may proceed with cephalosporin use.        Medication List        Accurate as of April 02, 2023 10:06 AM. If you have any questions, ask your nurse or doctor.          acetaminophen 500 MG tablet Commonly known as: TYLENOL Take 1 tablet (500 mg total) by mouth every 6 (six) hours as needed.   aspirin EC 81 MG tablet Take 1 tablet (81 mg total) by mouth daily.   atorvastatin 20 MG tablet Commonly known as: LIPITOR Take 1 tablet (20 mg total) by mouth daily.   budesonide 180 MCG/ACT inhaler  Commonly known as: PULMICORT Inhale 2 puffs into the lungs in the morning and at bedtime.   cetirizine 10 MG tablet Commonly known as: ZYRTEC Take 1 tablet (10 mg total) by mouth daily as needed for allergies.   diltiazem 120 MG 24 hr capsule Commonly known as: CARDIZEM CD Take 1 capsule (120 mg total) by mouth daily.   hydrochlorothiazide 25 MG tablet Commonly known as: HYDRODIURIL Take 1 tablet (25 mg total) by mouth daily.   ibuprofen 200 MG tablet Commonly known as: ADVIL Take 400 mg by mouth every 6  (six) hours as needed for headache or moderate pain.   ipratropium-albuterol 0.5-2.5 (3) MG/3ML Soln Commonly known as: DUONEB Take 3 mLs by nebulization every 6 (six) hours as needed.   levothyroxine 50 MCG tablet Commonly known as: SYNTHROID Take 1 tablet (50 mcg total) by mouth daily before breakfast.   losartan 50 MG tablet Commonly known as: COZAAR Take 1 tablet (50 mg total) by mouth daily.   Omega 3 1000 MG Caps Take 1,000 mg by mouth daily.   ondansetron 8 MG disintegrating tablet Commonly known as: ZOFRAN-ODT Take 1 tablet (8 mg total) by mouth every 6 (six) hours as needed for nausea or vomiting.   pantoprazole 40 MG tablet Commonly known as: PROTONIX Take 1 tablet (40 mg total) by mouth daily.   potassium chloride SA 20 MEQ tablet Commonly known as: KLOR-CON M Take 1 tablet (20 mEq total) by mouth daily.         Objective:   BP 121/66   Pulse 81   Temp 98.4 F (36.9 C) (Temporal)   Resp 20   Ht 5\' 2"  (1.575 m)   Wt 125 lb 3.2 oz (56.8 kg)   SpO2 98%   BMI 22.90 kg/m   Wt Readings from Last 3 Encounters:  04/02/23 125 lb 3.2 oz (56.8 kg)  02/26/23 120 lb (54.4 kg)  01/01/23 117 lb (53.1 kg)    Physical Exam Vitals and nursing note reviewed.  Constitutional:      General: She is not in acute distress.    Appearance: She is well-developed. She is not diaphoretic.  Eyes:     Conjunctiva/sclera: Conjunctivae normal.  Cardiovascular:     Rate and Rhythm: Normal rate and regular rhythm.     Heart sounds: Normal heart sounds. No murmur heard. Pulmonary:     Effort: Pulmonary effort is normal. No respiratory distress.     Breath sounds: Normal breath sounds. No wheezing.  Musculoskeletal:        General: No swelling or tenderness. Normal range of motion.  Skin:    General: Skin is warm and dry.     Findings: No rash.  Neurological:     Mental Status: She is alert and oriented to person, place, and time.     Coordination: Coordination normal.   Psychiatric:        Behavior: Behavior normal.       Assessment & Plan:   Problem List Items Addressed This Visit       Cardiovascular and Mediastinum   Hypertension (Chronic)   Relevant Medications   losartan (COZAAR) 50 MG tablet     Endocrine   Hypothyroidism   Relevant Orders   Bayer DCA Hb A1c Waived   CBC with Differential/Platelet   BMP8+EGFR   Lipid panel   TSH     Other   Hyperlipidemia with target LDL less than 100 (Chronic)   Relevant Medications   losartan (COZAAR)  50 MG tablet   Prediabetes - Primary   Relevant Medications   losartan (COZAAR) 50 MG tablet   Other Relevant Orders   Bayer DCA Hb A1c Waived   CBC with Differential/Platelet   BMP8+EGFR   Lipid panel   TSH   Other Visit Diagnoses     Essential hypertension       Relevant Medications   losartan (COZAAR) 50 MG tablet   Other Relevant Orders   Bayer DCA Hb A1c Waived   CBC with Differential/Platelet   BMP8+EGFR   Lipid panel   TSH       A1c looks good at 5.2 today, will see what other blood work shows.  Follow-up in 3 months.  Blood pressure looks good today as well, no changes. Follow up plan: Return in about 3 months (around 07/01/2023), or if symptoms worsen or fail to improve, for Prediabetes and hypertension and cholesterol.  Counseling provided for all of the vaccine components Orders Placed This Encounter  Procedures   Bayer DCA Hb A1c Waived   CBC with Differential/Platelet   BMP8+EGFR   Lipid panel   TSH    Arville Care, MD [facility] 04/02/2023, 10:06 AM

## 2023-04-03 LAB — LIPID PANEL
Chol/HDL Ratio: 2.8 {ratio} (ref 0.0–4.4)
Cholesterol, Total: 124 mg/dL (ref 100–199)
HDL: 44 mg/dL (ref >39)
LDL Chol Calc (NIH): 58 mg/dL (ref 0–99)
Triglycerides: 123 mg/dL (ref 0–149)
VLDL Cholesterol Cal: 22 mg/dL (ref 5–40)

## 2023-04-03 LAB — CBC WITH DIFFERENTIAL/PLATELET
Basophils Absolute: 0 10*3/uL (ref 0.0–0.2)
Basos: 0 %
EOS (ABSOLUTE): 0.2 10*3/uL (ref 0.0–0.4)
Eos: 2 %
Hematocrit: 39.7 % (ref 34.0–46.6)
Hemoglobin: 12.6 g/dL (ref 11.1–15.9)
Immature Grans (Abs): 0 10*3/uL (ref 0.0–0.1)
Immature Granulocytes: 0 %
Lymphocytes Absolute: 1.2 10*3/uL (ref 0.7–3.1)
Lymphs: 17 %
MCH: 30.1 pg (ref 26.6–33.0)
MCHC: 31.7 g/dL (ref 31.5–35.7)
MCV: 95 fL (ref 79–97)
Monocytes Absolute: 0.9 10*3/uL (ref 0.1–0.9)
Monocytes: 12 %
Neutrophils Absolute: 4.8 10*3/uL (ref 1.4–7.0)
Neutrophils: 69 %
Platelets: 121 10*3/uL — ABNORMAL LOW (ref 150–450)
RBC: 4.19 x10E6/uL (ref 3.77–5.28)
RDW: 13 % (ref 11.7–15.4)
WBC: 7.1 10*3/uL (ref 3.4–10.8)

## 2023-04-03 LAB — BMP8+EGFR
BUN/Creatinine Ratio: 21 (ref 12–28)
BUN: 17 mg/dL (ref 8–27)
CO2: 24 mmol/L (ref 20–29)
Calcium: 9.7 mg/dL (ref 8.7–10.3)
Chloride: 104 mmol/L (ref 96–106)
Creatinine, Ser: 0.81 mg/dL (ref 0.57–1.00)
Glucose: 128 mg/dL — ABNORMAL HIGH (ref 70–99)
Potassium: 3.5 mmol/L (ref 3.5–5.2)
Sodium: 143 mmol/L (ref 134–144)
eGFR: 77 mL/min/{1.73_m2} (ref >59)

## 2023-04-03 LAB — TSH: TSH: 3.02 u[IU]/mL (ref 0.450–4.500)

## 2023-04-28 ENCOUNTER — Ambulatory Visit (HOSPITAL_COMMUNITY): Admission: RE | Admit: 2023-04-28 | Payer: Medicare Other | Source: Ambulatory Visit

## 2023-05-06 ENCOUNTER — Ambulatory Visit: Payer: Medicare Other | Admitting: Internal Medicine

## 2023-05-07 ENCOUNTER — Encounter: Payer: Self-pay | Admitting: Internal Medicine

## 2023-05-07 ENCOUNTER — Ambulatory Visit: Payer: Medicare Other | Attending: Internal Medicine | Admitting: Internal Medicine

## 2023-05-07 VITALS — BP 138/78 | HR 79 | Ht 65.0 in | Wt 126.2 lb

## 2023-05-07 DIAGNOSIS — I4819 Other persistent atrial fibrillation: Secondary | ICD-10-CM | POA: Diagnosis not present

## 2023-05-07 DIAGNOSIS — I10 Essential (primary) hypertension: Secondary | ICD-10-CM | POA: Insufficient documentation

## 2023-05-07 MED ORDER — APIXABAN 5 MG PO TABS: 5.0000 mg | ORAL_TABLET | Freq: Two times a day (BID) | ORAL | 5 refills | Status: DC

## 2023-05-07 NOTE — Progress Notes (Signed)
 Cardiology Office Note  Date: 05/07/2023   ID: Tasha [name], Tasha [name] Tasha [name], MRN [id number]  PCP:  Dettinger, Lucio Sabin, MD  Cardiologist:  Lasalle Pointer, MD Electrophysiologist:  None    History of Present Illness: Tasha [name] is a 73 y.o. female known to have HTN, HLD, interstitial lung disease , paroxysmal A-fib is here for follow-up visit.  Patient was seen by Dr. Alda Amas in Jan 2023 for evaluation of CAD. She underwent a CTA cardiac with calcium  scoring which revealed 20 to 49% of nonobstructive CAD and calcium  scoring of 100.  For paroxysmal A-fib, she underwent event monitor in 2021 and 2022 with no recurrence of A-fib. She is continuing diltiazem  (as she has allergy to metoprolol ) and not on AC.  She wanted to discuss with pulmonology if it is okay to take [facility] with the investigational drug for interstitial lung disease.  She has not talked to her pulmonologist yet.  She does not report any symptoms of chest pain, DOE, dizziness, syncope, palpitations.  History obtained with the help of Spanish interpreter.  Past Medical History:  Diagnosis Date   Acute respiratory failure (HCC)    CAP (community acquired pneumonia)    Hyperlipidemia    Hypertension    Metabolic syndrome     Past Surgical History:  Procedure Laterality Date   CESAREAN SECTION     COLONOSCOPY N/A 06/07/2019   Procedure: COLONOSCOPY;  Surgeon: Alyce Jubilee, MD;  [facility];  Service: Endoscopy;  Laterality: N/A;  12:45   POLYPECTOMY  06/07/2019   Procedure: POLYPECTOMY;  Surgeon: Alyce Jubilee, MD;  [facility]: [facility];  Service: Endoscopy;;    Current Outpatient Medications  Medication Sig Dispense Refill   acetaminophen  (TYLENOL ) 500 MG tablet Take 1 tablet (500 mg total) by mouth every 6 (six) hours as needed. 30 tablet 0   apixaban  (ELIQUIS ) 5 MG TABS tablet Take 1 tablet (5 mg total) by mouth 2 (two) times daily. 30 tablet 5   atorvastatin  (LIPITOR) 20 MG tablet Take 1  tablet (20 mg total) by mouth daily. 90 tablet 3   budesonide  (PULMICORT ) 180 MCG/ACT inhaler Inhale 2 puffs into the lungs in the morning and at bedtime. 1 each 5   cetirizine  (ZYRTEC ) 10 MG tablet Take 1 tablet (10 mg total) by mouth daily as needed for allergies. 90 tablet 1   diltiazem  (CARDIZEM  CD) 120 MG 24 hr capsule Take 1 capsule (120 mg total) by mouth daily. 90 capsule 1   hydrochlorothiazide  (HYDRODIURIL ) 25 MG tablet Take 1 tablet (25 mg total) by mouth daily. 90 tablet 1   ibuprofen (ADVIL) 200 MG tablet Take 400 mg by mouth every 6 (six) hours as needed for headache or moderate pain.     ipratropium-albuterol  (DUONEB) 0.5-2.5 (3) MG/3ML SOLN Take 3 mLs by nebulization every 6 (six) hours as needed.     levothyroxine  (SYNTHROID ) 50 MCG tablet Take 1 tablet (50 mcg total) by mouth daily before breakfast. 90 tablet 1   losartan  (COZAAR ) 50 MG tablet Take 1 tablet (50 mg total) by mouth daily. 90 tablet 3   Omega 3 1000 MG CAPS Take 1,000 mg by mouth daily.      ondansetron  (ZOFRAN -ODT) 8 MG disintegrating tablet Take 1 tablet (8 mg total) by mouth every 6 (six) hours as needed for nausea or vomiting. 20 tablet 1   pantoprazole  (PROTONIX ) 40 MG tablet Take 1 tablet (40 mg total) by mouth daily. 90 tablet 3  potassium chloride  SA (KLOR-CON  M) 20 MEQ tablet Take 1 tablet (20 mEq total) by mouth daily. 90 tablet 1   No current facility-administered medications for this visit.   Allergies:  Hydromet [hydrocodone  bit-homatrop mbr], Toprol  xl [metoprolol ], and Amoxicillin   Social History: The patient  reports that she has never smoked. She has been exposed to tobacco smoke. She has never used smokeless tobacco. She reports that she does not drink alcohol and does not use drugs.   Family History: The patient's family history includes Breast cancer in her niece; Cancer in her father; Healthy in her brother, daughter, sister, son, and son; Memory loss in her sister.   ROS:  Please see the  history of present illness. Otherwise, complete review of systems is positive for none.  All other systems are reviewed and negative.   Physical Exam: VS:  BP 138/78   Pulse 79   Ht 5\' 5"  (1.651 m)   Wt 126 lb 3.2 oz (57.2 kg)   SpO2 98%   BMI 21.00 kg/m , BMI Body mass index is 21 kg/m.  Wt Readings from Last 3 Encounters:  05/07/23 126 lb 3.2 oz (57.2 kg)  04/02/23 125 lb 3.2 oz (56.8 kg)  02/26/23 120 lb (54.4 kg)    General: Patient appears comfortable at rest. HEENT: Conjunctiva and lids normal, oropharynx clear with moist mucosa. Neck: Supple, no elevated JVP or carotid bruits, no thyromegaly. Lungs: Clear to auscultation, nonlabored breathing at rest. Cardiac: Regular rate and rhythm, no S3 or significant systolic murmur, no pericardial rub. Abdomen: Soft, nontender, no hepatomegaly, bowel sounds present, no guarding or rebound. Extremities: No pitting edema, distal pulses 2+. Skin: Warm and dry. Musculoskeletal: No kyphosis. Neuropsychiatric: Alert and oriented x3, affect grossly appropriate.  Recent Labwork: 12/26/2022: ALT 52; AST 50 04/02/2023: BUN 17; Creatinine, Ser 0.81; Hemoglobin 12.6; Platelets 121; Potassium 3.5; Sodium 143; TSH 3.020     Component Value Date/Time   CHOL 124 04/02/2023 0914   CHOL 180 09/23/2012 1335   TRIG 123 04/02/2023 0914   TRIG 108 08/10/2014 0852   TRIG 135 09/23/2012 1335   HDL 44 04/02/2023 0914   HDL 40 08/10/2014 0852   HDL 37 (L) 09/23/2012 1335   CHOLHDL 2.8 04/02/2023 0914   LDLCALC 58 04/02/2023 0914   LDLCALC 118 (H) 05/04/2013 1528   LDLCALC 116 (H) 09/23/2012 1335   LDLDIRECT 77 07/11/2016 1536    Other Studies Reviewed Today: CT cardiac with calcium  scoring in 03/2021 IMPRESSION: 1. Coronary calcium  score of 100. This was 82nd percentile for age, sex, and race matched control. 2. Normal coronary origin with right dominance. 3. CAD-RADS 2. Mild non-obstructive CAD (25-49%). Consider non-atherosclerotic causes of  chest pain. Consider preventive therapy and risk factor modification. 4. Aortic atherosclerosis.  Echo from 2022 LVEF 55 to 60% RV systolic function is normal Trivial pericardial effusion  Assessment and Plan:   Paroxysmal A-fib: EKG today showed NSR.  Continue diltiazem  120 mg once daily (allergic to metoprolol ).  CV score is 3, will start Eliquis  5 mg twice daily.  Discontinue aspirin  81 mg once daily.  Discussed the benefit and risk of being on [facility].  She would like to discuss with her pulmonologist before starting the medication.  Coronary artery calcifications: CT cardiac in 2022 showed mild nonobstructive CAD.  30 to 49% CAD.  Calcium  score is 100.  Will discontinue aspirin  as we are starting Eliquis  as stated above and continue atorvastatin  20 mg nightly.  HTN, controlled: Continue  HCTZ 25 mg once daily.  HLD, unknown values: Continue atorvastatin  20 mg nightly.  Goal LDL less than 100.   Medication Adjustments/Labs and Tests Ordered: Current medicines are reviewed at length with the patient today.  Concerns regarding medicines are outlined above.   Tests Ordered: Orders Placed This Encounter  Procedures   EKG 12-Lead    Medication Changes: Meds ordered this encounter  Medications   apixaban  (ELIQUIS ) 5 MG TABS tablet    Sig: Take 1 tablet (5 mg total) by mouth 2 (two) times daily.    Dispense:  30 tablet    Refill:  5    05/07/2023-New    Disposition:  Follow up  1 year  Signed, Jabre Heo Beauford Bounds, MD, 05/07/2023 4:31 PM    [facility] [address] [place], [place] [address]

## 2023-05-07 NOTE — Patient Instructions (Signed)
 Medication Instructions:  Your physician has recommended you make the following change in your medication:  Stop taking Aspirin  Start taking Eliquis  5 mg twice daily Continue taking all other medications as prescribed  Labwork: None  Testing/Procedures: None  Follow-Up: Your physician recommends that you schedule a follow-up appointment in: 1 year. You will receive a reminder call in about 8 months reminding you to schedule your appointment. If you don't receive this call, please contact our office.   Any Other Special Instructions Will Be Listed Below (If Applicable).  Gave reading material on Eliquis   Thank you for choosing [facility]!      If you need a refill on your cardiac medications before your next appointment, please call your pharmacy.

## 2023-05-29 ENCOUNTER — Other Ambulatory Visit: Payer: Self-pay | Admitting: Family Medicine

## 2023-05-29 DIAGNOSIS — Z1231 Encounter for screening mammogram for malignant neoplasm of breast: Secondary | ICD-10-CM

## 2023-06-02 ENCOUNTER — Ambulatory Visit
Admission: RE | Admit: 2023-06-02 | Discharge: 2023-06-02 | Disposition: A | Discharge status: Home / Self Care | Payer: Medicare Other | Source: Ambulatory Visit | Attending: Family Medicine

## 2023-06-02 DIAGNOSIS — Z1231 Encounter for screening mammogram for malignant neoplasm of breast: Secondary | ICD-10-CM

## 2023-06-27 ENCOUNTER — Ambulatory Visit: Payer: Medicare Other | Admitting: Gastroenterology

## 2023-07-04 ENCOUNTER — Ambulatory Visit: Payer: Medicare Other | Admitting: Family Medicine

## 2023-07-04 ENCOUNTER — Encounter: Payer: Self-pay | Admitting: Family Medicine

## 2023-07-04 VITALS — BP 127/63 | HR 82 | Ht 65.0 in | Wt 131.0 lb

## 2023-07-04 DIAGNOSIS — I4819 Other persistent atrial fibrillation: Secondary | ICD-10-CM | POA: Diagnosis not present

## 2023-07-04 DIAGNOSIS — E039 Hypothyroidism, unspecified: Secondary | ICD-10-CM

## 2023-07-04 DIAGNOSIS — I10 Essential (primary) hypertension: Secondary | ICD-10-CM

## 2023-07-04 DIAGNOSIS — R7303 Prediabetes: Secondary | ICD-10-CM | POA: Diagnosis not present

## 2023-07-04 DIAGNOSIS — E785 Hyperlipidemia, unspecified: Secondary | ICD-10-CM | POA: Diagnosis not present

## 2023-07-04 LAB — BAYER DCA HB A1C WAIVED: HB A1C (BAYER DCA - WAIVED): 5.1 % (ref 4.8–5.6)

## 2023-07-04 MED ORDER — DILTIAZEM HCL ER COATED BEADS 120 MG PO CP24: 120.0000 mg | ORAL_CAPSULE | Freq: Every day | ORAL | 3 refills | Status: DC

## 2023-07-04 MED ORDER — HYDROCHLOROTHIAZIDE 25 MG PO TABS: 25.0000 mg | ORAL_TABLET | Freq: Every day | ORAL | 3 refills | Status: DC

## 2023-07-04 MED ORDER — LEVOTHYROXINE SODIUM 50 MCG PO TABS: 50.0000 ug | ORAL_TABLET | Freq: Every day | ORAL | 3 refills | Status: DC

## 2023-07-04 MED ORDER — POTASSIUM CHLORIDE CRYS ER 20 MEQ PO TBCR: 20.0000 meq | EXTENDED_RELEASE_TABLET | Freq: Every day | ORAL | 3 refills | Status: DC

## 2023-07-04 MED ORDER — CETIRIZINE HCL 10 MG PO TABS: 10.0000 mg | ORAL_TABLET | Freq: Every day | ORAL | 3 refills | Status: DC | PRN

## 2023-07-04 NOTE — Progress Notes (Signed)
 BP 127/63   Pulse 82   Ht 5\' 5"  (1.651 m)   Wt 131 lb (59.4 kg)   SpO2 99%   BMI 21.80 kg/m    Subjective:   [name], female    DOB: 16-Feb-1951, 73 y.o.   MRN: [id number]  HPI: Tasha [name] is a 73 y.o. female presenting on 07/04/2023 for Medical Management of Chronic Issues, Prediabetes, and Hypertension   HPI Prediabetes Patient comes in today for recheck of his diabetes. Patient has been currently taking no medication, diet control. Patient is currently on an ACE inhibitor/ARB. Patient has not seen an ophthalmologist this year. Patient denies any new issues with their feet. The symptom started onset as an adult hypertension and A-fib and hyperlipidemia and hypothyroidism ARE RELATED TO DM   Hypertension and A-fib recheck Patient is currently on Eliquis and diltiazem and hydrochlorothiazide and losartan, and their blood pressure today is 127/63. Patient denies any lightheadedness or dizziness. Patient denies headaches, blurred vision, chest pains, shortness of breath, or weakness. Denies any side effects from medication and is content with current medication.   Hyperlipidemia Patient is coming in for recheck of his hyperlipidemia. The patient is currently taking atorvastatin and fish oils. They deny any issues with myalgias or history of liver damage from it. They deny any focal numbness or weakness or chest pain.   Hypothyroidism recheck Patient is coming in for thyroid recheck today as well. They deny any issues with hair changes or heat or cold problems or diarrhea or constipation. They deny any chest pain or palpitations. They are currently on levothyroxine 50 micrograms   Relevant past medical, surgical, family and social history reviewed and updated as indicated. Interim medical history since our last visit reviewed. Allergies and medications reviewed and updated.  Review of Systems  Constitutional:  Negative for chills and fever.  HENT:  Negative for  congestion, ear discharge and ear pain.   Eyes:  Negative for redness and visual disturbance.  Respiratory:  Negative for chest tightness, shortness of breath and wheezing.   Cardiovascular:  Negative for chest pain and leg swelling.  Genitourinary:  Negative for difficulty urinating and dysuria.  Musculoskeletal:  Negative for back pain and gait problem.  Skin:  Negative for rash.  Neurological:  Negative for light-headedness and headaches.  Psychiatric/Behavioral:  Negative for agitation and behavioral problems.   All other systems reviewed and are negative.   Per HPI unless specifically indicated above   Allergies as of 07/04/2023       Reactions   Hydromet [hydrocodone Bit-homatrop Mbr]    Pt doesn't remember   Toprol Xl [metoprolol] Hives   Facial rash, itching, hives   Amoxicillin Rash   Did it involve swelling of the face/tongue/throat, SOB, or low BP? No Did it involve sudden or severe rash/hives, skin peeling, or any reaction on the inside of your mouth or nose? Unknown Did you need to seek medical attention at a hospital or doctor's office? Unknown When did it last happen?      10 + years If all above answers are "NO", may proceed with cephalosporin use.        Medication List        Accurate as of July 04, 2023 10:01 AM. If you have any questions, ask your nurse or doctor.          acetaminophen 500 MG tablet Commonly known as: TYLENOL Take 1 tablet (500 mg total) by mouth every 6 (six) hours  as needed.   apixaban 5 MG Tabs tablet Commonly known as: ELIQUIS Take 1 tablet (5 mg total) by mouth 2 (two) times daily.   atorvastatin 20 MG tablet Commonly known as: LIPITOR Take 1 tablet (20 mg total) by mouth daily.   budesonide 180 MCG/ACT inhaler Commonly known as: PULMICORT Inhale 2 puffs into the lungs in the morning and at bedtime.   cetirizine 10 MG tablet Commonly known as: ZYRTEC Take 1 tablet (10 mg total) by mouth daily as needed for  allergies.   diltiazem 120 MG 24 hr capsule Commonly known as: CARDIZEM CD Take 1 capsule (120 mg total) by mouth daily.   hydrochlorothiazide 25 MG tablet Commonly known as: HYDRODIURIL Take 1 tablet (25 mg total) by mouth daily.   ibuprofen 200 MG tablet Commonly known as: ADVIL Take 400 mg by mouth every 6 (six) hours as needed for headache or moderate pain.   ipratropium-albuterol 0.5-2.5 (3) MG/3ML Soln Commonly known as: DUONEB Take 3 mLs by nebulization every 6 (six) hours as needed.   levothyroxine 50 MCG tablet Commonly known as: SYNTHROID Take 1 tablet (50 mcg total) by mouth daily before breakfast.   losartan 50 MG tablet Commonly known as: COZAAR Take 1 tablet (50 mg total) by mouth daily.   Omega 3 1000 MG Caps Take 1,000 mg by mouth daily.   ondansetron 8 MG disintegrating tablet Commonly known as: ZOFRAN-ODT Take 1 tablet (8 mg total) by mouth every 6 (six) hours as needed for nausea or vomiting.   pantoprazole 40 MG tablet Commonly known as: PROTONIX Take 1 tablet (40 mg total) by mouth daily.   potassium chloride SA 20 MEQ tablet Commonly known as: KLOR-CON M Take 1 tablet (20 mEq total) by mouth daily.         Objective:   BP 127/63   Pulse 82   Ht 5\' 5"  (1.651 m)   Wt 131 lb (59.4 kg)   SpO2 99%   BMI 21.80 kg/m   Wt Readings from Last 3 Encounters:  07/04/23 131 lb (59.4 kg)  05/07/23 126 lb 3.2 oz (57.2 kg)  04/02/23 125 lb 3.2 oz (56.8 kg)    Physical Exam Vitals and nursing note reviewed.  Constitutional:      General: She is not in acute distress.    Appearance: She is well-developed. She is not diaphoretic.  Eyes:     Conjunctiva/sclera: Conjunctivae normal.  Cardiovascular:     Rate and Rhythm: Normal rate and regular rhythm.     Heart sounds: Normal heart sounds. No murmur heard. Pulmonary:     Effort: Pulmonary effort is normal. No respiratory distress.     Breath sounds: Normal breath sounds. No wheezing.   Musculoskeletal:        General: No swelling.  Skin:    General: Skin is warm and dry.     Findings: No rash.  Neurological:     Mental Status: She is alert and oriented to person, place, and time.     Coordination: Coordination normal.  Psychiatric:        Behavior: Behavior normal.       Assessment & Plan:   Problem List Items Addressed This Visit       Cardiovascular and Mediastinum   Hypertension (Chronic)   Relevant Medications   diltiazem (CARDIZEM CD) 120 MG 24 hr capsule   hydrochlorothiazide (HYDRODIURIL) 25 MG tablet   Persistent atrial fibrillation (HCC)   Relevant Medications   diltiazem (CARDIZEM CD)  120 MG 24 hr capsule   hydrochlorothiazide (HYDRODIURIL) 25 MG tablet     Endocrine   Hypothyroidism   Relevant Medications   levothyroxine (SYNTHROID) 50 MCG tablet     Other   Hyperlipidemia with target LDL less than 100 (Chronic)   Relevant Medications   diltiazem (CARDIZEM CD) 120 MG 24 hr capsule   hydrochlorothiazide (HYDRODIURIL) 25 MG tablet   Prediabetes - Primary   Relevant Orders   Bayer DCA Hb A1c Waived   Other Visit Diagnoses       Essential hypertension       Relevant Medications   diltiazem (CARDIZEM CD) 120 MG 24 hr capsule   hydrochlorothiazide (HYDRODIURIL) 25 MG tablet       A1c looks good at 5.1.  Blood pressure looks good as well.  Heart rate and everything sounds good there.  No changes Follow up plan: Return in about 3 months (around 10/04/2023), or if symptoms worsen or fail to improve, for Prediabetes and hypertension and hypothyroidism.  Counseling provided for all of the vaccine components Orders Placed This Encounter  Procedures   Bayer DCA Hb A1c Waived    Arville Care, MD [facility] 07/04/2023, 10:01 AM

## 2023-07-07 ENCOUNTER — Telehealth: Payer: Self-pay | Admitting: *Deleted

## 2023-07-07 NOTE — Telephone Encounter (Signed)
 Pt has ov with MR scheduled for Thurs 3/20, however, she never had HRCT that was requested   PCC's- can you please get this CT rescheduled and then appt with MR after? Thanks!

## 2023-07-08 NOTE — Telephone Encounter (Signed)
 Pt has been rescheduled for both OV and CT. Left VM and sent Letter to pt. NFN

## 2023-07-10 ENCOUNTER — Ambulatory Visit: Admitting: Internal Medicine

## 2023-07-14 ENCOUNTER — Ambulatory Visit (INDEPENDENT_AMBULATORY_CARE_PROVIDER_SITE_OTHER): Admitting: Family Medicine

## 2023-07-14 ENCOUNTER — Encounter: Payer: Self-pay | Admitting: Family Medicine

## 2023-07-14 VITALS — BP 125/71 | HR 92 | Temp 96.5°F | Ht 65.0 in | Wt 131.0 lb

## 2023-07-14 DIAGNOSIS — J0110 Acute frontal sinusitis, unspecified: Secondary | ICD-10-CM | POA: Diagnosis not present

## 2023-07-14 DIAGNOSIS — J069 Acute upper respiratory infection, unspecified: Secondary | ICD-10-CM | POA: Diagnosis not present

## 2023-07-14 LAB — VERITOR FLU A/B WAIVED
Influenza A: NEGATIVE
Influenza B: NEGATIVE

## 2023-07-14 MED ORDER — CEFDINIR 300 MG PO CAPS: 300.0000 mg | ORAL_CAPSULE | Freq: Two times a day (BID) | ORAL | 0 refills | Status: DC

## 2023-07-14 NOTE — Progress Notes (Signed)
 BP 125/71   Pulse 92   Temp (!) 96.5 F (35.8 C)   Ht 5\' 5"  (1.651 m)   Wt 131 lb (59.4 kg)   SpO2 95%   BMI 21.80 kg/m    Subjective:   [name], female    DOB: 1950-12-03, 73 y.o.   MRN: [id number]  HPI: Tasha [name] is a 73 y.o. female presenting on 07/14/2023 for Cough, Headache, and Nasal Congestion   HPI Cough and congestion and headache Patient is coming in today with cough and congestion and headache and nasal drainage.  She said it started about 3 or 4 days ago.  She denies any fevers or chills that she knows of but has felt a little warm at times.  She has used her inhaler twice and feels like it did help some.  She denies any shortness of breath or wheezing.  She denies any sick contacts that she knows of.  She has not used anything over-the-counter at this point.  The cough has been better point that it keeps her up at night.  Relevant past medical, surgical, family and social history reviewed and updated as indicated. Interim medical history since our last visit reviewed. Allergies and medications reviewed and updated.  Review of Systems  Constitutional:  Positive for fever (Felt warm at times but does not know if she had a true fever). Negative for chills.  HENT:  Positive for congestion, postnasal drip, rhinorrhea, sinus pressure and sore throat. Negative for ear discharge and ear pain.   Eyes:  Negative for visual disturbance.  Respiratory:  Positive for cough and wheezing. Negative for chest tightness and shortness of breath.   Cardiovascular:  Negative for chest pain and leg swelling.  Genitourinary:  Negative for difficulty urinating and dysuria.  Musculoskeletal:  Negative for back pain and gait problem.  Skin:  Negative for rash.  Neurological:  Negative for light-headedness and headaches.  Psychiatric/Behavioral:  Negative for agitation and behavioral problems.   All other systems reviewed and are negative.   Per HPI unless  specifically indicated above   Allergies as of 07/14/2023       Reactions   Hydromet [hydrocodone Bit-homatrop Mbr]    Pt doesn't remember   Toprol Xl [metoprolol] Hives   Facial rash, itching, hives   Amoxicillin Rash   Did it involve swelling of the face/tongue/throat, SOB, or low BP? No Did it involve sudden or severe rash/hives, skin peeling, or any reaction on the inside of your mouth or nose? Unknown Did you need to seek medical attention at a hospital or doctor's office? Unknown When did it last happen?      10 + years If all above answers are "NO", may proceed with cephalosporin use.        Medication List        Accurate as of July 14, 2023 10:08 AM. If you have any questions, ask your nurse or doctor.          acetaminophen 500 MG tablet Commonly known as: TYLENOL Take 1 tablet (500 mg total) by mouth every 6 (six) hours as needed.   apixaban 5 MG Tabs tablet Commonly known as: ELIQUIS Take 1 tablet (5 mg total) by mouth 2 (two) times daily.   atorvastatin 20 MG tablet Commonly known as: LIPITOR Take 1 tablet (20 mg total) by mouth daily.   budesonide 180 MCG/ACT inhaler Commonly known as: PULMICORT Inhale 2 puffs into the lungs in the morning and at  bedtime.   cefdinir 300 MG capsule Commonly known as: OMNICEF Take 1 capsule (300 mg total) by mouth 2 (two) times daily. 1 po BID Started by: [name] Elizabeth Paulsen   cetirizine 10 MG tablet Commonly known as: ZYRTEC Take 1 tablet (10 mg total) by mouth daily as needed for allergies.   diltiazem 120 MG 24 hr capsule Commonly known as: CARDIZEM CD Take 1 capsule (120 mg total) by mouth daily.   hydrochlorothiazide 25 MG tablet Commonly known as: HYDRODIURIL Take 1 tablet (25 mg total) by mouth daily.   ibuprofen 200 MG tablet Commonly known as: ADVIL Take 400 mg by mouth every 6 (six) hours as needed for headache or moderate pain.   ipratropium-albuterol 0.5-2.5 (3) MG/3ML Soln Commonly known as:  DUONEB Take 3 mLs by nebulization every 6 (six) hours as needed.   levothyroxine 50 MCG tablet Commonly known as: SYNTHROID Take 1 tablet (50 mcg total) by mouth daily before breakfast.   losartan 50 MG tablet Commonly known as: COZAAR Take 1 tablet (50 mg total) by mouth daily.   Omega 3 1000 MG Caps Take 1,000 mg by mouth daily.   ondansetron 8 MG disintegrating tablet Commonly known as: ZOFRAN-ODT Take 1 tablet (8 mg total) by mouth every 6 (six) hours as needed for nausea or vomiting.   pantoprazole 40 MG tablet Commonly known as: PROTONIX Take 1 tablet (40 mg total) by mouth daily.   potassium chloride SA 20 MEQ tablet Commonly known as: KLOR-CON M Take 1 tablet (20 mEq total) by mouth daily.         Objective:   BP 125/71   Pulse 92   Temp (!) 96.5 F (35.8 C)   Ht 5\' 5"  (1.651 m)   Wt 131 lb (59.4 kg)   SpO2 95%   BMI 21.80 kg/m   Wt Readings from Last 3 Encounters:  07/14/23 131 lb (59.4 kg)  07/04/23 131 lb (59.4 kg)  05/07/23 126 lb 3.2 oz (57.2 kg)    Physical Exam Vitals reviewed.  Constitutional:      General: She is not in acute distress.    Appearance: She is well-developed. She is not diaphoretic.  HENT:     Right Ear: External ear normal.     Left Ear: External ear normal.     Nose: Mucosal edema and rhinorrhea present.     Right Sinus: No maxillary sinus tenderness or frontal sinus tenderness.     Left Sinus: No maxillary sinus tenderness or frontal sinus tenderness.     Mouth/Throat:     Pharynx: Uvula midline. Posterior oropharyngeal erythema present. No oropharyngeal exudate.     Tonsils: No tonsillar abscesses.  Eyes:     Conjunctiva/sclera: Conjunctivae normal.  Cardiovascular:     Rate and Rhythm: Normal rate and regular rhythm.     Heart sounds: Normal heart sounds. No murmur heard. Pulmonary:     Effort: Pulmonary effort is normal. No respiratory distress.     Breath sounds: Normal breath sounds. No wheezing, rhonchi or  rales.  Musculoskeletal:        General: No swelling.  Skin:    General: Skin is warm and dry.     Findings: No rash.  Neurological:     Mental Status: She is alert and oriented to person, place, and time.     Coordination: Coordination normal.  Psychiatric:        Behavior: Behavior normal.     Flu negative, COVID pending  Assessment &  Plan:   Problem List Items Addressed This Visit   None Visit Diagnoses       Upper respiratory infection with cough and congestion    -  Primary   Relevant Medications   cefdinir (OMNICEF) 300 MG capsule   Other Relevant Orders   Veritor Flu A/B Waived   Novel Coronavirus, NAA ([facility])     Acute non-recurrent frontal sinusitis       Relevant Medications   cefdinir (OMNICEF) 300 MG capsule       Somewhat high risk with age and comorbidities, will treat with cephalosporin antibiotic, will let her know once the COVID test comes back.  Likely sinus infection Follow up plan: Return if symptoms worsen or fail to improve.  Counseling provided for all of the vaccine components Orders Placed This Encounter  Procedures   Novel Coronavirus, NAA ([facility])   Veritor Flu A/B Waived    Arville Care, MD [facility] 07/14/2023, 10:08 AM

## 2023-07-15 LAB — NOVEL CORONAVIRUS, NAA: SARS-CoV-2, NAA: NOT DETECTED

## 2023-08-07 ENCOUNTER — Other Ambulatory Visit: Payer: Self-pay | Admitting: Internal Medicine

## 2023-08-19 ENCOUNTER — Ambulatory Visit (HOSPITAL_COMMUNITY)
Admission: RE | Admit: 2023-08-19 | Discharge: 2023-08-19 | Disposition: A | Discharge status: Home / Self Care | Source: Ambulatory Visit | Attending: Internal Medicine | Admitting: Internal Medicine

## 2023-08-19 DIAGNOSIS — J849 Interstitial pulmonary disease, unspecified: Secondary | ICD-10-CM | POA: Insufficient documentation

## 2023-08-19 DIAGNOSIS — I700 Atherosclerosis of aorta: Secondary | ICD-10-CM | POA: Diagnosis not present

## 2023-08-19 DIAGNOSIS — J8410 Pulmonary fibrosis, unspecified: Secondary | ICD-10-CM | POA: Diagnosis not present

## 2023-09-04 ENCOUNTER — Encounter: Payer: Self-pay | Admitting: Internal Medicine

## 2023-09-04 ENCOUNTER — Ambulatory Visit: Admitting: Internal Medicine

## 2023-09-04 VITALS — BP 138/68 | HR 82 | Ht 65.0 in | Wt 135.0 lb

## 2023-09-04 DIAGNOSIS — Z5181 Encounter for therapeutic drug level monitoring: Secondary | ICD-10-CM | POA: Diagnosis not present

## 2023-09-04 DIAGNOSIS — Z7722 Contact with and (suspected) exposure to environmental tobacco smoke (acute) (chronic): Secondary | ICD-10-CM | POA: Diagnosis not present

## 2023-09-04 DIAGNOSIS — J849 Interstitial pulmonary disease, unspecified: Secondary | ICD-10-CM | POA: Diagnosis not present

## 2023-09-04 DIAGNOSIS — R768 Other specified abnormal immunological findings in serum: Secondary | ICD-10-CM

## 2023-09-04 NOTE — Progress Notes (Signed)
 OV 01/22/18 73 year old female, never smoked. PMH  Hypertension, metabolic syndrome, thrombocytopenia, prediabetes, interstitial lung disease, PAF. Not previously followed by [facility] pulmonary care. Admitted to [facility] from outside hospital on 08/27 with acute hypoxic respiratory failure in setting of bilateral pulmonary infiltrates. Work up indicated BOOPD. Dx subacute ILD vs pneumonia thought to be from exposure to antifreeze leak in patients car. Requiring intubation from 08/30-09/05, chest tube placed due to pneumothorax. [facility] from 09/10- 09/20, required min assistance with mobility and basic self-care tasks. Speech therapy worked on dysphagia, able to tolerate regular diet thin liquids without signs and symptoms of aspiration.    Patient presents today for hospital follow-up visit. Accompanied by her daughter who also provides translation. She is feeling a lot better, no breathing issues. Finished oral steroid today. BS 119. CBC improving. Eating and drinking ok. No difficulties swallowing and denies aspiration. Ambulting with walker. Denies fall, sob, wheezing, cough, fever or chest pain.    Results for Tasha, [name] (MRN [id number]) as of 02/19/2018 10:01  Ref. Range 12/16/2017 19:24 12/17/2017 18:59  Sed Rate Latest Ref Range: 0 - 22 mm/hr 76 (H) 69 (H)  Results for Tasha, [name] (MRN [id number]) as of 02/19/2018 10:01  Ref. Range 12/17/2017 18:59  Speckled Pattern Unknown 1:320 (H)  Results for Tasha, [name] (MRN [id number]) as of 02/19/2018 10:01  Ref. Range 12/19/2017 14:15  Color, Fluid Latest Ref Range: YELLOW  COLORLESS (A)  WBC, Fluid Latest Ref Range: 0 - 1,000 cu mm 93  Lymphs, Fluid Latest Units: % 78  Eos, Fluid Latest Units: % 2  Appearance, Fluid Latest Ref Range: CLEAR  CLEAR (A)  Neutrophil Count, Fluid Latest Ref Range: 0 - 25 % 7  Monocyte-Macrophage-Serous Fluid Latest Ref Range: 50 - 90 % 13 (L)     OV  02/19/2018  Subjective:  [name], female , DOB: [birth year] , age 66 y.o. , MRN: [id number] , ADDRESS: [address] [place] [address]   02/19/2018 -   Chief Complaint  Patient presents with   Follow-up    States she still has dry cough, states the SOB has resolved. Denies chest pain or idscomfort.      HPI Tasha [name] 73 y.o. -accompanied by her daughter Shelagh Derrick.  End of August 2019 she suffered acute lung injury possibly Boop based on the nature of infiltrates and a high ESR and a positive ANA [BAL lymphocytosis].  There is some history of antifreeze exposure.  She has recovered from it and is now at home.  She is brought in by Dr. Shelagh Derrick.  Shelagh Derrick is doing the translation.  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  Post discharge in the hospital patient is off oxygen and is overall better.  Steroids ended 2 weeks ago but since the completion of steroids she feels the cough is coming back and getting worse.  Currently cough is moderate in intensity and without any sputum production.  2 days ago her primary care physician apparently changed 1 of her antihypertensives because of the cough.  I am unable to determine which one but I suspect it was an ACE inhibitor.  She is currently on losartan .  There is no fever or chills.  Current walking desaturation test appears adequate        ROS  OV 03/31/2018  Subjective:  [name], female , DOB: 26-Sep-1950 , age 30  y.o. , MRN: [id number] , ADDRESS: [address] [place] [address]   03/31/2018 -   Chief Complaint  Patient presents with   Follow-up    pt states breathing is doing well. c/o non prod cough.     HPI Tasha [name] 73 y.o. -presents for interstitial lung disease acute with lymphocytic bronchoalveolar lavage after antifreeze exposure  After last visit she showed improvement.  We repeated ANA and this was negative.  We also did some extra  antibody such as anti-Jo 1 and this was negative.  Her ESR has normalized.  In the interim she continues to improve.  Shortness of breath is almost nonexistent.  Cough is improved but she still has mild residual cough.  She had high-resolution CT scan of the chest #2019 that I personally visualized and there is still some residual groundglass opacities along with some evolving fibrotic pattern.  It seems more predominant in the lung base.  Her her son Lawerance Presser is with her.  History is gained from talking to interpreter on the video camera.  His name is Cornelius Dill.  She denies any diabetes or hyperlipidemia or stroke in the past.  She does not have any mood swings.  In the past she has tolerated prednisone  without problem.  Denies any bony issues.   OV 06/11/2018  Subjective:  [name], female , DOB: 08/11/1950 , age 2 y.o. , MRN: [id number] , ADDRESS: [address] [place] [address]  Kerrington Poss 73 y.o. -presents for interstitial lung disease acute with lymphocytic bronchoalveolar lavage after antifreeze exposure 06/11/2018 -   Chief Complaint  Patient presents with   Follow-up    PFT performed today.  Pt states she has been doing well since last visit and denies any complaints.      HPI Tasha [name] 73 y.o. -follow-up.  At last visit in December 2019 I placed her on a 10-week prednisone  course.  In between she came and saw a nurse practitioner approximately just over a month ago.  She was tolerating the prednisone  fine.  It is now 4 weeks and she finished the prednisone .  According to the daughter who acted as the interpreter the prednisone  really did help with her symptoms.  It improved her shortness of breath.  Currently she is nearly asymptomatic.  Her current symptoms are documented below.  She is feeling good.  She had pulmonary function test today with spirometry being normal and DLCO being near normal.  She is noted to be on ACE inhibitor but she denies any cough.  Last CT  scan of the chest was November 2019    OV 12/07/2019   Subjective:  [name], female , DOB: Mar 06, 1951, age 5 y.o. years. , MRN: [id number],  ADDRESS: [address] [place] [address] PCP  Dettinger, Lucio Sabin, MD Providers : Treatment Team:  Attending Provider: Maire Scot, MD presents for interstitial lung disease acute with lymphocytic bronchoalveolar lavage after antifreeze exposure in aug 2019  Chief Complaint  Patient presents with   Follow-up    Last seen 06/11/2018. Pt states she has been about the same since last visit. Pt is still having complaints of cough and is coughing up occ white phlegm. Pt also has increased SOB when she is in enclosed areas that makes her feel like she cannot breathe.       HPI Tasha [name] 73 y.o. -presents with her daughter-in-law is for a face-to-face visit.  The interpreter is Rice Chamorro.  Rice Chamorro is from  [place].  It appears that overall since her last visit in February 2020 patient is stable.  In August 2020 she had a CT scan of the chest that shows presence of mild ongoing ILD.  She tells us  that she is had the Covid vaccine.  She has been exercising at the [facility].  She does not use oxygen.  She has mild shortness of breath with exertion when she bends down and some mild cough but overall compared to 2019 she is slowly progressively better.  She does have some dry cough when she is sitting as well.  She needs to drink water  to improve that.  There are no other new medical issues.  Her walking desaturation test is unchanged compared to February 2020.   however when she did the interstitial lung disease symptom questionnaire symptoms are significantly worse than before.  She really cannot pinpoint if she is definitely worse or not.  She did admit that she has significant environmental allergies to pollen dust and perfumes.  This is a new history.   CT chest high resolution Aug 2020 COMPARISON:  03/11/2018, 12/17/2017    FINDINGS: Cardiovascular: Aortic atherosclerosis. Normal heart size. Scattered coronary artery calcifications. No pericardial effusion.   Mediastinum/Nodes: No enlarged mediastinal, hilar, or axillary lymph nodes. Thyroid  gland, trachea, and esophagus demonstrate no significant findings.   Lungs/Pleura: There is a mild pattern of pulmonary fibrosis with an apical to basal gradient featuring mild tubular bronchiectasis, irregular peripheral interstitial opacity with some superimposed ground-glass, and some evidence of bronchiolectasis in the lung bases. There is no overt honeycombing. No significant air trapping on expiratory phase imaging. There is a densely calcified benign pulmonary nodule of the left lung base. No pleural effusion or pneumothorax.  IMPRESSION: 1. There is a mild pattern of pulmonary fibrosis with an apical to basal gradient featuring mild tubular bronchiectasis, irregular peripheral interstitial opacity with some superimposed ground-glass, and some evidence of bronchiolectasis in the lung bases. There is no overt honeycombing. No significant air trapping on expiratory phase imaging. Findings are not significantly changed compared to prior examination and consistent with an "indeterminate for UIP" pattern fibrosis by ATS pulmonary fibrosis criteria.   2.  Aortic atherosclerosis and coronary artery disease.     Electronically Signed   By: Hubbard Mad M.D.   On: 12/10/2018 12:13  Results for LACOLE, CUMBO (MRN [id number]) as of 06/11/2018 12:13  Ref. Range 06/11/2018 10:31  FVC-Pre Latest Units: L 2.05  FVC-%Pred-Pre Latest Units: % 74  FEV1-Pre Latest Units: L 1.88  FEV1-%Pred-Pre Latest Units: % 90  Pre FEV1/FVC ratio Latest Units: % 92   Results for DUANA, LAUTT (MRN [id number]) as of 06/11/2018 12:13  Ref. Range 06/11/2018 10:31  DLCO unc Latest Units: ml/min/mmHg 13.22  DLCO unc % pred Latest Units: % 74  Results for MIKEILA, SUTTER (MRN  [id number]) as of 03/31/2018 16:37  Ref. Range 12/16/2017 19:24 12/17/2017 18:59 02/19/2018 10:53  Sed Rate Latest Ref Range: 0 - 30 mm/hr 76 (H) 69 (H) 18  Results for AMILLIANA, PASCHE (MRN [id number]) as of 03/31/2018 16:37  Ref. Range 02/19/2018 10:53 02/19/2018 11:03  Anti Nuclear Antibody(ANA) Latest Ref Range: Negative   Negative  Anti JO-1 Latest Ref Range: 0.0 - 0.9 AI  <0.2  ds DNA Ab Latest Units: IU/mL <1   Results for SUSSIE, DIOGO (MRN [id number]) as of 01/25/2020 16:46  Ref. Range 11/25/2018 09:57 01/25/2019 09:31  Anti Nuclear Antibody (ANA) Latest Ref Range: NEGATIVE   POSITIVE (A)  ANA  Pattern 1 Unknown  Nuclear, Nucleolar (A)  ANA Titer 1 Latest Units: titer Positive (A) 1:640 (H)  Cyclic Citrullin Peptide Ab Latest Units: UNITS 3 <16  ds DNA Ab Latest Units: IU/mL  <1  RA Latex Turbid. Latest Ref Range: <14 IU/mL 10.9 <14  ENA SM Ab Ser-aCnc Latest Ref Range: <1.0 NEG AI  <1.0 NEG  Homogeneous Pattern Unknown 1:160 (H)   Speckled Pattern Unknown 1:320 (H)       OV 01/25/2020   Subjective:  [name], female , DOB: 1951-04-12, age 45 y.o. years. , MRN: [id number],  ADDRESS: [address] [place] [address] PCP  Dettinger, Lucio Sabin, MD Providers : Treatment Team:  Attending Provider: Maire Scot, MD   Chief Complaint  Patient presents with   Follow-up    cough decreased     August 2019 she suffered acute lung injury possibly Boop based on the nature of infiltrates and a high ESR and a positive ANA [BAL lymphocytosis].  There is some history of antifreeze exposure. -> ILD  Hx of env allergies - blood allergy test Aug 2021 - igE normal / but positive for Grass  Intermittent ANA positive - last +ve late 2020   HPI Tasha [name] 73 y.o. -presents with her daughter in law Neill Balm and also the interpreter Antony Baumgartner.  This visit has been scheduled because last visit she had increased symptoms.  Therefore we did a high-resolution CT chest Dr.  Alfonse Angle the radiologist thinks the slight progression.  However we also repeated pulmonary function test improvement/stabilization.  In terms of symptoms patient tells me her symptoms are actually improved.  ILD symptom score actually shows improvement.  She attributes this to exercise.  We did walking desaturation test and it is stable.  Glucotrol documented below.  He understands that she has pulmonary fibrosis.  In fact she says that she looked at [employer] and got extremely worried about pulmonary fibrosis.  At this point in time she denies any symptoms of connective tissue disease.  Overall she is stable.  She continues to have cough.  We did blood allergy work-up and is positive for grass mold IgE is normal.  She was supposed to exam nitric oxide test but this has not been done.  She has had a Covid vaccine and flu shot.  She had to have a booster counseled her about that.   No results found for: NITRICOXIDE     IMPRESSION: 1. The appearance of the lungs is compatible with interstitial lung disease, with mild progression of changes compared to the prior study, with a spectrum of findings considered probable usual interstitial pneumonia (UIP) per current ATS guidelines. 2. Aortic atherosclerosis, in addition to left main and 2 vessel coronary artery disease. Please note that although the presence of coronary artery calcium  documents the presence of coronary artery disease, the severity of this disease and any potential stenosis cannot be assessed on this non-gated CT examination. Assessment for potential risk factor modification, dietary therapy or pharmacologic therapy may be warranted, if clinically indicated.   Aortic Atherosclerosis (ICD10-I70.0).     Electronically Signed   By: Alexandria Angel M.D.   On: 12/31/2019 08:22 OV 10/26/2020  Subjective:  [name], female , DOB: Jul 17, 1950 , age 12 y.o. , MRN: [id number] , ADDRESS: [address] [place]  [address] PCP Dettinger, Lucio Sabin, MD Patient Care Team: Dettinger, Lucio Sabin, MD as PCP - General (Family Medicine) Flavia Hughs, MD (Inactive) as PCP -  Cardiology (Cardiology) Riley Cheadle Windsor Hatcher, MD as Consulting Physician (Gastroenterology)  This Provider for this visit: Treatment Team:  Attending Provider: Maire Scot, MD    10/26/2020 -   Chief Complaint  Patient presents with   Follow-up    Pt states she is feeling better since last visit. States that she does have some SOB but after using her inhaler twice a day she feels fine.    August 2019 she suffered acute lung injury possibly Boop based on the nature of infiltrates and a high ESR and a positive ANA [BAL lymphocytosis].  There is some history of antifreeze exposure. -> ILD  Hx of env allergies - blood allergy test Aug 2021 - igE normal / but positive for Grass  Intermittent ANA positive - last +ve late 2020   HPI Tasha [name] 72 y.o. -returns for follow-up with her daughter-in-law Neill Balm.  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  She feels the Pulmicort  is helping her.  In fact symptoms improved after Pulmicort .  There are no interim health issues.  Symptom score is stable.  Walking desaturation test is stable.  Previously we checked ANA and is again positive the entire ANA profile as listed.  On exam she still has bilateral bibasal crackles      OV 04/27/2021  Subjective:  [name], female , DOB: 11/09/1950 , age 6 y.o. , MRN: [id number] , ADDRESS: [address] [place] [address] PCP Dettinger, Lucio Sabin, MD Patient Care Team: Dettinger, Lucio Sabin, MD as PCP - General (Family Medicine) Wendie Hamburg, MD as PCP - Cardiology (Cardiology) Riley Cheadle Windsor Hatcher, MD as Consulting Physician (Gastroenterology)  This Provider for this visit: Treatment Team:  Attending Provider: Maire Scot, MD    04/27/2021 -   Chief Complaint  Patient presents with   Follow-up     PFT performed today.  Pt states she is about the same since last visit.    August 2019 she suffered acute lung injury possibly Boop based on the nature of infiltrates and a high ESR and a positive ANA [BAL lymphocytosis].  There is some history of antifreeze exposure. -> ILD  Hx of env allergies - blood allergy test Aug 2021 - igE normal / but positive for Grass  Intermittent ANA positive - last +ve late 2020  HPI Tasha [name] 73 y.o. -returns for follow-up.  Her daughter acts as the interpreter.  Overall she feels stable.  This can be evidence in the ILD symptom score.  Also walking desaturation test is stable.  She is interested in pulmonary rehabilitation to improve her shortness of breath that is still there somewhat.  Her pulmonary function test shows and suggest decline in 3 years.  She is not feeling this.  Last CT scan of the chest was in September 2021.  Explained the need for repeat CT scan and pulmonary function test at a closer than 33-month interval and she is willing.  Explained that if there is evidence of progression we would consider antifibrotic's and she is willing.  Meanwhile she will get referred to pulmonary rehabilitation.    .      OV 07/26/2021  Subjective:  [name], female , DOB: [birth year] , age 13 y.o. , MRN: [id number] , ADDRESS: [address] [place] [address] PCP Dettinger, Lucio Sabin, MD Patient Care Team: Dettinger, Lucio Sabin, MD as PCP - General (Family Medicine) Wendie Hamburg, MD as PCP - Cardiology (Cardiology) Suzette Espy, MD as Consulting  Physician (Gastroenterology)  This Provider for this visit: Treatment Team:  Attending Provider: Maire Scot, MD   07/26/2021 -   Chief Complaint  Patient presents with   Follow-up    PFT performed today.  Pt states she has been doing okay since last visit and denies any complaints.     HPI Tasha [name] 73 y.o. -ILD undifferentiated phenotype.  Presents for  follow-up.  Last visit was in January 2023.  There was some concern for progression so ordered high-resolution CT chest and pulmonary function testing.  For unclear reason she missed a high-resolution CT chest.  She does not remember she had an appointment.  CMA tells me that she missed appointment.  She did have pulmonary function testing.  It shows stability compared to January 2023 but possible decline compared to 2 years ago or 3 years ago.  Symptom wise she is going to pulmonary rehabilitation she says she is feeling better but the symptom score below is inaccurate.  This time it was filled by interpreter.  Previously has been filled by family.  So do not know which of the numbers are accurate.  Did indicate to her that there is recent stability in her fibrosis.  Did indicate to her that the goal is continued stability.  Did indicate to her maybe in the last few years she is slightly worse.  Did indicate to her that if there is progressive phenotype then she would need antifibrotic's.  Did indicate to her that given current stability and feeling better with rehabilitatin and without evidence of CT scan we will hold off on any antifibrotic initiation for today    OV 02/07/2022  Subjective:  [name], female , DOB: January 02, 1951 , age 47 y.o. , MRN: [id number] , ADDRESS: [address] [place] [address] PCP Dettinger, Lucio Sabin, MD Patient Care Team: Dettinger, Lucio Sabin, MD as PCP - General (Family Medicine) Wendie Hamburg, MD as PCP - Cardiology (Cardiology) Riley Cheadle Windsor Hatcher, MD as Consulting Physician (Gastroenterology) Maire Scot, MD as Consulting Physician (Pulmonary Disease)  This Provider for this visit: Treatment Team:  Attending Provider: Maire Scot, MD    02/07/2022 -   Chief Complaint  Patient presents with   Follow-up    Review PFT and CT.  No sx noted today.  Would like flu vaccine today    HPI Tasha [name] 73 y.o. -returns for  follow-up.  Presents with her daughter-in-law Christopher Cramp.  Interpreter is Oza Blumenthal from [employer].  She tells me that she is stable.  Cristine Done rehabilitation really worked well.  Symptom score shows stability she had pulmonary function test is also shows stability.  However she had high-resolution CT chest and I personally visualized this and compared it and showed it to her and her family.  I agree with the radiologist that significant worsening.  Therefore she has progressive phenotype.  The pattern is looking like probable UIP working towards definite of UIP which would be IPF but everything started after inhalation lung injury.  The scan shows coronary artery calcification.  No prior stress test.   She denies any GI issues of bleeding diathesis.  CT scan    OV 12/26/2022  Subjective:  [name], female , DOB: 17-May-1950 , age 38 y.o. , MRN: [id number] , ADDRESS: [address] [place] [address] PCP Dettinger, Lucio Sabin, MD Patient Care Team: Dettinger, Lucio Sabin, MD as PCP - General (Family Medicine) Mallipeddi, Kennyth Pean, MD as PCP - Cardiology (  Cardiology) Riley Cheadle, Windsor Hatcher, MD as Consulting Physician (Gastroenterology) Maire Scot, MD as Consulting Physician (Pulmonary Disease)  This Provider for this visit: Treatment Team:  Attending Provider: Maire Scot, MD  04/19/2022: Tasha [name] with [facility] NP for follow-up.  She actually came to see me around 4 weeks ago but she had yet to start on Ofev  therapy and given her stability, we postpone her appointment.  Since then, she has started on her antifibrotic's; believes the date was around 11/30.  She has been tolerating these well.  Has not noticed any significant side effects.  Overall, she feels like her breathing is stable.  She actually feels like it has been a little bit better over the last few months.  She feels like she has been trying to work on her exercise and activity status.  She was able to go  shopping over Christmas and did not have any difficulty walking through the stores.  Her cough is very minimal, almost nonexistent.  She denies any wheezing, chest congestion, lower extremity swelling, orthopnea.  She is using budesonide  inhaler twice daily.  Is not sure if this is made much of a difference for her.  She would like to see if she does okay using it less often.  Never uses her nebulizer treatments       06/14/2022: Today - follow up Patient presents today for follow up. She had PFTs today. Spirometry was stable. DLCO was declined when compared to 4 months ago but improved compared to a year ago. She feels stable to how she was last time she was here. She doesn't feel like she has much trouble with her breathing. She's able to go shopping now without any troubles. She has a rare, dry cough. She has been tolerating Ofev  well. No GI side effects. She is curious if she has any food limitations on this. Otherwise, no concerns or complaints today.    12/26/2022 -   Chief Complaint  Patient presents with   Follow-up    PFT 12/26/22 SOB     August 2019 she suffered acute lung injury possibly Boop based on the nature of infiltrates and a high ESR and a positive ANA [BAL lymphocytosis].  There is some history of antifreeze exposure. -> ILD  Hx of env allergies - blood allergy test Aug 2021 - igE normal / but positive for Grass  Intermittent ANA positive - last +ve late 2020  Coronary artery calcification follows up with cardiology in any pain  HPI Tasha [name] 73 y.o. -presents with her son.  Interpreter had to leave because it was running 15-30 minutes late.  Son did the translation.  She called in approximately a month ago with significant amount of weight loss diarrhea nausea and also chest chest tightness.  We had her stop the nintedanib.  She is off it now she is gained her weight back no diarrhea no nausea.  Regarding this is an allergy.  In terms of her respiratory status she says  she is stable.  Pulmonary function test shows stability.  This only had a CT scan that showed decline back in the fall 2023.  No interim hospitalizations or ER visits or surgeries.  Symptom score appears stable.  Sit/stand test did not show any exertional desaturations.   CT Chest data from date: *[facility] 01/25/22  - personally visualized and independently interpreted : no - my findings are: per official report   IMPRESSION: 1. Progressive interstitial lung disease with imaging findings once again categorized as probable  usual interstitial pneumonia (UIP), as above. 2. Aortic atherosclerosis, in addition to left main and three-vessel coronary artery disease. Assessment for potential risk factor modification, dietary therapy or pharmacologic therapy may be warranted, if clinically indicated. 3. Colonic diverticulosis.   Aortic Atherosclerosis (ICD10-I70.0).     Electronically Signed   By: Alexandria Angel M.D.   On: 01/26/2022 12:50    OV 09/04/2023  Subjective:  [name], female , DOB: [birth year] , age 35 y.o. , MRN: [id number] , ADDRESS: [address] [place] [address] PCP Dettinger, Lucio Sabin, MD Patient Care Team: Dettinger, Lucio Sabin, MD as PCP - General (Family Medicine) Mallipeddi, Kennyth Pean, MD as PCP - Cardiology (Cardiology) Riley Cheadle Windsor Hatcher, MD as Consulting Physician (Gastroenterology) Maire Scot, MD as Consulting Physician (Pulmonary Disease)  This Provider for this visit: Treatment Team:  Attending Provider: Maire Scot, MD    09/04/2023 -   Chief Complaint  Patient presents with   Follow-up    Breathing has improved since the last visit. Has occ mild, coughing episode.      HPI Tasha [name] 73 y.o. -presents for follow-up.  Presents with her son.  She is Spanish only speaking.  Interpreter as a Dietitian was a formal interpreter.  Interpreter's name is [email or web address].Aaron Aas  History is obtained from the patient through the  interpreter.  The son is not a historian.  She reports she is stable. Interim Health status: No new complaints No new medical problems. No new surgeries. No ER visits. No Urgent care visits. No changes to medications although she does say that she has an upcoming tooth extraction she is on Eliquis  for paroxysmal atrial fibrillation and wanted know about anticoagulation hold protocols.  I deferred it to cardiology but did indicate to her at least 48 hours without Eliquis  before and after surgery.  I did pointed to her that her pulmonary function fibrosis is getting worse even compared to October 2023.  Currently all the nintedanib side effects are washed out.  Even though the symptom burden is mild the disease is slowly progressive.  I visualized and show to the CT scan.  We then went on to discuss about pirfenidone/Esbriet as a next antifibrotic option   Esbriet/Pirfenidone requires intensive drug monitoring due to high concerns for Adverse effects of , including  Drug Induced Liver Injury, significant GI side effects that include but not limited to Diarrhea, Nausea, Vomiting,  and other system side effects that include Fatigue, headaches, weight loss and other side effects such as skin rash. These will be monitored with  blood work such as LFT initially once a month for 6 months and then quarterly   Based on the above she is willing to try this medication.   Liver function test was slightly elevated in September 2024 and this will require repeat.   SYMPTOM SCALE - ILD 06/11/2018   12/07/2019   01/25/2020 Last Weight  Most recent update: 01/25/2020  4:14 PM      Weight  65.1 kg (143 lb 9.6 oz)                     7/7/2022145# 04/27/2021   07/26/2021 Pulmonary rehabilitation at [facility] since February 2023  -filled by interpreter Shelagh Derrick 02/07/2022   04/19/2022 06/14/2022 12/26/2022 Off ofev  due to gi sidde ffects 09/04/2023 On supprotiove cre  O2 use ra ra ra ra ra ra ra ra ra ra ra  Shortness of  Breath 0 -> 5 scale with  5 being worst (score 6 If unable to do)           0      At rest 0 0 0 0 0 2 0 0 0 0  0  Simple tasks - showers, clothes change, eating, shaving 0 3 0 0 0 2 0 0 0 1  0  Household (dishes, doing bed, laundry) 0 3 3 03 4 3 02 0 0 1  0  Shopping 1 4 3  03 0 1 0 0 0 0 0  Walking level at own pace 0 4 0 0 0 1 0 0 0 1  2  Walking stairs 0 6 4 4 2 3 4 1 1 3  2  Total (40 - 48) Dyspnea Score 1 20 10 10 6 12 6 1 1 6 1   How bad is your cough? 0 5 3 4 4 2 1 1 1 1  1  How bad is your fatigue 3 4 4 3 3 3  0 0 0 3 0  nausea   0 0 0 0 0 0 0 0 0 0   vomit   0 0 0 0 0 0 0 0 0 0   diarrhea   0 0 0 0 0 0 0 0 0 0   anzity   3 3 3  0 00 0  0 0 1 0  Dep[resssio   meds 0 0 0 0 0 0 0 0 0     Simple office walk 185 feet x  3 laps goal with forehead probe 02/19/2018   03/31/2018   06/11/2018   12/07/2019   01/25/2020   10/26/2020   04/27/2021   04/19/2022 12/26/2022   O2 used rooom air Room air Room air ra ra ra ra ra ra  Number laps completed 3 3 x 250 feet 3 x 236 feet 3 laops 3 3 3 3  Sit stand x 10  Comments about pace Moderate pace Mod pace Normal pace avg pace   Avg pace avg avg avg  Resting Pulse Ox/HR 99% and 71/min 100% and 89/min 100% and 83/mn 100% and 83/min 99% and 99/min 99% and 83 98% and 83 98% and 80 98% and 85  Final Pulse Ox/HR 95% and 92/min 98% and 111 98% and 101/min 98% and 101/min 98% and 107/min 97% and 99 97% and 101 98% and 92 97% and 85  Desaturated </= 88% no no no no no non no no   Desaturated <= 3% points yes no no no none no no no   Got Tachycardic >/= 90/min yes yes yes yes yes yes yes no   Symptoms at end of test none none none Mild dyspnea none noe Mild dyspnea none   Miscellaneous comments x x x                CT Chest data from date: A[ril 2025  - personally visualized and independently interpreted : ues - my findings are:  AGREE   IMPRESSION: 1. Pulmonary parenchymal pattern of interstitial lung disease, as detailed above, is mildly progressive from  01/25/2022. Findings are categorized as probable UIP per consensus guidelines: Diagnosis of Idiopathic Pulmonary Fibrosis: An Official ATS/ERS/JRS/ALAT Clinical Practice Guideline. Am Annie Barton Crit Care Med Vol 198, Iss 5, [phone], Dec 21 2016. 2. Aortic atherosclerosis (ICD10-I70.0). Coronary artery calcification.     Electronically Signed   By: Shearon Denis M.D.   On: 08/20/2023 13:00 PFT     Latest Ref Rng & Units 12/26/2022    2:52 PM 06/14/2022  9:49 AM 02/07/2022    1:39 PM 07/26/2021    9:08 AM 04/27/2021   10:21 AM 12/10/2019   12:24 PM 06/11/2018   10:31 AM  PFT Results  FVC-Pre L 2.18  2.12  2.04  2.05  1.98  2.13  2.05   FVC-Predicted Pre % 86  83  78  78  75  79  74   FVC-Post L      2.18    FVC-Predicted Post %      81    Pre FEV1/FVC % % 50  90  92  91  89  91  92   Post FEV1/FCV % %      92    FEV1-Pre L 1.08  1.91  1.88  1.86  1.77  1.94  1.88   FEV1-Predicted Pre % 57  99  95  94  88  96  90   FEV1-Post L      2.01    DLCO uncorrected ml/min/mmHg 12.58  11.51  14.38  11.06  10.43   13.22   DLCO UNC% % 73  67  82  63  59   74   DLCO corrected ml/min/mmHg 12.58  11.51  14.38  11.06  10.28     DLCO COR %Predicted % 73  67  82  63  58     DLVA Predicted % 95  84  114  83  80   95        LAB RESULTS last 96 hours No results found.       has a past medical history of Acute respiratory failure (HCC), CAP (community acquired pneumonia), Hyperlipidemia, Hypertension, and Metabolic syndrome.   reports that she has never smoked. She has been exposed to tobacco smoke. She has never used smokeless tobacco.  Past Surgical History:  Procedure Laterality Date   CESAREAN SECTION     COLONOSCOPY N/A 06/07/2019   Procedure: COLONOSCOPY;  Surgeon: Alyce Jubilee, MD;  [facility];  Service: Endoscopy;  Laterality: N/A;  12:45   POLYPECTOMY  06/07/2019   Procedure: POLYPECTOMY;  Surgeon: Alyce Jubilee, MD;  Location: [facility];  Service:  Endoscopy;;    Allergies  Allergen Reactions   Hydromet [Hydrocodone  Bit-Homatrop Mbr]     Pt doesn't remember   Ofev  [Nintedanib] Diarrhea   Toprol  Xl [Metoprolol ] Hives    Facial rash, itching, hives   Amoxicillin Rash    Did it involve swelling of the face/tongue/throat, SOB, or low BP? No Did it involve sudden or severe rash/hives, skin peeling, or any reaction on the inside of your mouth or nose? Unknown Did you need to seek medical attention at a hospital or doctor's office? Unknown When did it last happen?      10 + years If all above answers are "NO", may proceed with cephalosporin use.     Immunization History  Administered Date(s) Administered   Fluad Quad(high Dose 65+) 03/01/2019, 01/14/2020, 01/16/2021, 02/07/2022   Fluad Trivalent(High Dose 65+) 01/01/2023   Influenza, High Dose Seasonal PF 03/11/2017, 03/09/2018   Influenza,inj,Quad PF,6+ Mos 02/04/2014, 02/09/2015, 02/20/2016   Moderna Sars-Covid-2 Vaccination 05/21/2019, 06/18/2019   Pneumococcal Conjugate-13 08/10/2015   Pneumococcal Polysaccharide-23 06/17/2017, 12/21/2017   Tdap 12/31/2021   Zoster Recombinant(Shingrix) 09/21/2020, 06/21/2021    Family History  Problem Relation Age of Onset   Cancer Father    Healthy Brother    Healthy Daughter    Healthy Sister    Memory  loss Sister    Healthy Son    Healthy Son    Breast cancer Niece      Current Outpatient Medications:    acetaminophen  (TYLENOL ) 500 MG tablet, Take 1 tablet (500 mg total) by mouth every 6 (six) hours as needed., Disp: 30 tablet, Rfl: 0   apixaban  (ELIQUIS ) 5 MG TABS tablet, Take 1 tablet by mouth twice daily, Disp: 180 tablet, Rfl: 1   atorvastatin  (LIPITOR) 20 MG tablet, Take 1 tablet (20 mg total) by mouth daily., Disp: 90 tablet, Rfl: 3   budesonide  (PULMICORT ) 180 MCG/ACT inhaler, Inhale 2 puffs into the lungs in the morning and at bedtime., Disp: 1 each, Rfl: 5   cetirizine  (ZYRTEC ) 10 MG tablet, Take 1 tablet (10 mg  total) by mouth daily as needed for allergies., Disp: 90 tablet, Rfl: 3   diltiazem  (CARDIZEM  CD) 120 MG 24 hr capsule, Take 1 capsule (120 mg total) by mouth daily., Disp: 90 capsule, Rfl: 3   hydrochlorothiazide  (HYDRODIURIL ) 25 MG tablet, Take 1 tablet (25 mg total) by mouth daily., Disp: 90 tablet, Rfl: 3   ibuprofen (ADVIL) 200 MG tablet, Take 400 mg by mouth every 6 (six) hours as needed for headache or moderate pain., Disp: , Rfl:    ipratropium-albuterol  (DUONEB) 0.5-2.5 (3) MG/3ML SOLN, Take 3 mLs by nebulization every 6 (six) hours as needed., Disp: , Rfl:    levothyroxine  (SYNTHROID ) 50 MCG tablet, Take 1 tablet (50 mcg total) by mouth daily before breakfast., Disp: 90 tablet, Rfl: 3   losartan  (COZAAR ) 50 MG tablet, Take 1 tablet (50 mg total) by mouth daily., Disp: 90 tablet, Rfl: 3   Omega 3 1000 MG CAPS, Take 1,000 mg by mouth daily. , Disp: , Rfl:    ondansetron  (ZOFRAN -ODT) 8 MG disintegrating tablet, Take 1 tablet (8 mg total) by mouth every 6 (six) hours as needed for nausea or vomiting., Disp: 20 tablet, Rfl: 1   pantoprazole  (PROTONIX ) 40 MG tablet, Take 1 tablet (40 mg total) by mouth daily., Disp: 90 tablet, Rfl: 3   potassium chloride  SA (KLOR-CON  M) 20 MEQ tablet, Take 1 tablet (20 mEq total) by mouth daily., Disp: 90 tablet, Rfl: 3      Objective:   Vitals:   09/04/23 1556  BP: 138/68  Pulse: 82  SpO2: 97%  Weight: 135 lb (61.2 kg)  Height: 5\' 5"  (1.651 m)    Estimated body mass index is 22.47 kg/m as calculated from the following:   Height as of this encounter: 5\' 5"  (1.651 m).   Weight as of this encounter: 135 lb (61.2 kg).  @WEIGHTCHANGE @  [employer]   09/04/23 1556  Weight: 135 lb (61.2 kg)     Physical Exam   General: No distress. Looks well O2 at rest: no Cane present: no Sitting in wheel chair: no Frail: no Obese: no Neuro: Alert and Oriented x 3. GCS 15. Speech normal Psych: Pleasant Resp:  Barrel Chest - nono.  Wheeze - no,  Crackles - YES BASE, No overt respiratory distress CVS: Normal heart sounds. Murmurs - no Ext: Stigmata of Connective Tissue Disease - no HEENT: Normal upper airway. PEERL +. No post nasal drip        Assessment:       ICD-10-CM   1. Interstitial lung disease (HCC)  J84.9 Hepatic function panel    Pulmonary function test    2. ANA positive  R76.8 Hepatic function panel    Pulmonary function test  3. Medication monitoring encounter  Z51.81 Hepatic function panel    Pulmonary function test         Plan:     Patient Instructions     ICD-10-CM   1. Interstitial lung disease (HCC)  J84.9 Hepatic function panel    Pulmonary function test    2. ANA positive  R76.8 Hepatic function panel    Pulmonary function test    3. Medication monitoring encounter  Z51.81 Hepatic function panel    Pulmonary function test          - -BAL lymphocytosis August 2019 following antifreeze exposure and ANA positivity  -  -ILD got worse in October 2023 CT scan of the chest and alsso worse since then through APril 2025  - Ofev  caused weight loss, diarrhea, nausea in 2024   - LFT increased in sept 2024  Plan - NO MORE OFEV     -  cma to list it as allergy - weight loss, diarrhea, nausea  --Check LFT  09/04/2023   - STart Low dose esbriet protocol   - spirometrn and dlco In 8 weeks  Follow-up - 8 weeks with APP to discuss esbriet update   FOLLOWUP Return in about 8 weeks (around 10/30/2023) for with any of the APPS, after Spiro and DLCO, Face to Face Visit.    SIGNATURE    Dr. Maire Scot, M.D., F.C.C.P,  Pulmonary and Critical Care Medicine Staff Physician, [facility] Director - Interstitial Lung Disease  Program  Pulmonary Fibrosis [facility] Network at [facility] [place], [place], [address]  Pager: [phone], If no answer or between  15:00h - 7:00h: call [phone] Telephone: [phone]  4:56 PM 09/04/2023

## 2023-09-04 NOTE — Patient Instructions (Addendum)
 ICD-10-CM   1. Interstitial lung disease (HCC)  J84.9 Hepatic function panel    Pulmonary function test    2. ANA positive  R76.8 Hepatic function panel    Pulmonary function test    3. Medication monitoring encounter  Z51.81 Hepatic function panel    Pulmonary function test          - -BAL lymphocytosis August 2019 following antifreeze exposure and ANA positivity  -  -ILD got worse in October 2023 CT scan of the chest and alsso worse since then through APril 2025  - Ofev  caused weight loss, diarrhea, nausea in 2024   - LFT increased in sept 2024  Plan - NO MORE OFEV     -  cma to list it as allergy - weight loss, diarrhea, nausea  --Check LFT  09/04/2023   - STart Low dose esbriet protocol   - spirometrn and dlco In 8 weeks  Follow-up - 8 weeks with APP to discuss esbriet update

## 2023-09-05 ENCOUNTER — Other Ambulatory Visit: Payer: Self-pay | Admitting: Internal Medicine

## 2023-09-05 ENCOUNTER — Other Ambulatory Visit

## 2023-09-05 DIAGNOSIS — J849 Interstitial pulmonary disease, unspecified: Secondary | ICD-10-CM

## 2023-09-05 DIAGNOSIS — R7989 Other specified abnormal findings of blood chemistry: Secondary | ICD-10-CM | POA: Diagnosis not present

## 2023-09-05 DIAGNOSIS — R768 Other specified abnormal immunological findings in serum: Secondary | ICD-10-CM

## 2023-09-05 DIAGNOSIS — Z5181 Encounter for therapeutic drug level monitoring: Secondary | ICD-10-CM

## 2023-09-06 LAB — HEPATIC FUNCTION PANEL
ALT: 53 IU/L — ABNORMAL HIGH (ref 0–32)
AST: 52 IU/L — ABNORMAL HIGH (ref 0–40)
Albumin: 4.3 g/dL (ref 3.8–4.8)
Alkaline Phosphatase: 71 IU/L (ref 44–121)
Bilirubin Total: 0.5 mg/dL (ref 0.0–1.2)
Bilirubin, Direct: 0.13 mg/dL (ref 0.00–0.40)
Total Protein: 6.8 g/dL (ref 6.0–8.5)

## 2023-09-08 ENCOUNTER — Telehealth: Payer: Self-pay

## 2023-09-08 DIAGNOSIS — J849 Interstitial pulmonary disease, unspecified: Secondary | ICD-10-CM

## 2023-09-08 NOTE — Telephone Encounter (Signed)
 Received New start paperwork for low-dose PIRFENIDONE. Pt previously had adverse effects with Ofev . Will update as we work through the benefits process.  Submitted a Prior Authorization request to [facility] for PIRFENIDONE via CoverMyMeds. Will update once we receive a response.  Key: [id number]

## 2023-09-12 ENCOUNTER — Other Ambulatory Visit (HOSPITAL_COMMUNITY): Payer: Self-pay

## 2023-09-12 NOTE — Telephone Encounter (Signed)
 Received notification from [facility] regarding a prior authorization for PIRFENIDONE. Authorization has been APPROVED from 09/08/2023 to 04/21/2098. Approval letter sent to scan center.  Per test claim, copay for 30 days supply is $4.90  Patient can fill through [facility]: [phone]   Authorization # [id number]  Routing to [facility] for clinical f/u.

## 2023-09-30 MED ORDER — PIRFENIDONE 267 MG PO TABS
ORAL_TABLET | ORAL | 0 refills | Status: DC
  Filled 2023-10-01: qty 159, 30d supply, fill #0

## 2023-09-30 MED ORDER — PIRFENIDONE 267 MG PO TABS
534.0000 mg | ORAL_TABLET | Freq: Three times a day (TID) | ORAL | 2 refills | Status: DC
  Filled 2023-10-01 – 2023-11-11 (×3): qty 180, 30d supply, fill #0
  Filled 2023-12-23: qty 180, 30d supply, fill #1

## 2023-09-30 NOTE — Telephone Encounter (Signed)
 Rx for pirfenidone sent to [facility]. Patient counseled at OV on 09/04/23 by Dr. Jeffery Ming, PharmD, MPH, BCPS, CPP Clinical Pharmacist (Rheumatology and Pulmonology)

## 2023-10-01 ENCOUNTER — Other Ambulatory Visit: Payer: Self-pay

## 2023-10-01 ENCOUNTER — Other Ambulatory Visit (HOSPITAL_COMMUNITY): Payer: Self-pay

## 2023-10-01 NOTE — Progress Notes (Signed)
 Specialty Pharmacy Initial Fill Coordination Note  Tasha [name] is a 73 y.o. female contacted today regarding initial fill of specialty medication(s) Pirfenidone   Patient requested Delivery   Delivery date: 10/03/23   Verified address: [address]  [place] [address]   Medication will be filled on 10/02/23.   Patient is aware of $4.90 copayment. She requests that medication be billed to AR account. She also does not have MyChart, so welcome packet will need to be mailed to her.

## 2023-10-02 ENCOUNTER — Other Ambulatory Visit: Payer: Self-pay

## 2023-10-02 ENCOUNTER — Other Ambulatory Visit (HOSPITAL_COMMUNITY): Payer: Self-pay

## 2023-10-06 ENCOUNTER — Encounter: Payer: Self-pay | Admitting: Family Medicine

## 2023-10-06 ENCOUNTER — Ambulatory Visit (INDEPENDENT_AMBULATORY_CARE_PROVIDER_SITE_OTHER): Admitting: Family Medicine

## 2023-10-06 VITALS — BP 129/75 | HR 77 | Temp 97.9°F | Ht 65.0 in | Wt 136.0 lb

## 2023-10-06 DIAGNOSIS — E785 Hyperlipidemia, unspecified: Secondary | ICD-10-CM

## 2023-10-06 DIAGNOSIS — E039 Hypothyroidism, unspecified: Secondary | ICD-10-CM | POA: Diagnosis not present

## 2023-10-06 DIAGNOSIS — K219 Gastro-esophageal reflux disease without esophagitis: Secondary | ICD-10-CM

## 2023-10-06 DIAGNOSIS — R7303 Prediabetes: Secondary | ICD-10-CM | POA: Diagnosis not present

## 2023-10-06 DIAGNOSIS — I10 Essential (primary) hypertension: Secondary | ICD-10-CM

## 2023-10-06 LAB — BAYER DCA HB A1C WAIVED: HB A1C (BAYER DCA - WAIVED): 5.1 % (ref 4.8–5.6)

## 2023-10-06 MED ORDER — PANTOPRAZOLE SODIUM 40 MG PO TBEC: 40.0000 mg | DELAYED_RELEASE_TABLET | Freq: Every day | ORAL | 3 refills | Status: AC

## 2023-10-06 MED ORDER — ATORVASTATIN CALCIUM 20 MG PO TABS: 20.0000 mg | ORAL_TABLET | Freq: Every day | ORAL | 3 refills | Status: AC

## 2023-10-06 NOTE — Progress Notes (Signed)
 BP 129/75   Pulse 77   Temp 97.9 F (36.6 C)   Ht 5' 5 (1.651 m)   Wt 136 lb (61.7 kg)   SpO2 96%   BMI 22.63 kg/m    Subjective:   [name], female    DOB: 06-07-1950, 73 y.o.   MRN: [id number]  HPI: Tasha [name] is a 73 y.o. female presenting on 10/06/2023 for No chief complaint on file.   HPI prediabetes Patient comes in today for recheck of his diabetes. Patient has been currently taking no medication. Patient is currently on an ACE inhibitor/ARB. Patient has not seen an ophthalmologist this year. Patient denies any new issues with their feet. The symptom started onset as an adult hypertension and hyperlipidemia and hypothyroidism ARE RELATED TO DM   Hypertension Patient is currently on diltiazem  and hydrochlorothiazide  and losartan , and their blood pressure today is 129/75. Patient denies any lightheadedness or dizziness. Patient denies headaches, blurred vision, chest pains, shortness of breath, or weakness. Denies any side effects from medication and is content with current medication.   Hyperlipidemia Patient is coming in for recheck of his hyperlipidemia. The patient is currently taking fish oils and atorvastatin . They deny any issues with myalgias or history of liver damage from it. They deny any focal numbness or weakness or chest pain.   Hypothyroidism recheck Patient is coming in for thyroid  recheck today as well. They deny any issues with hair changes or heat or cold problems or diarrhea or constipation. They deny any chest pain or palpitations. They are currently on levothyroxine  50 micrograms   GERD Patient is currently on pantoprazole .  She denies any major symptoms or abdominal pain or belching or burping. She denies any blood in her stool or lightheadedness or dizziness.   Relevant past medical, surgical, family and social history reviewed and updated as indicated. Interim medical history since our last visit reviewed. Allergies and  medications reviewed and updated.  Review of Systems  Constitutional:  Negative for chills and fever.  Eyes:  Negative for visual disturbance.  Respiratory:  Negative for chest tightness and shortness of breath.   Cardiovascular:  Negative for chest pain and leg swelling.  Genitourinary:  Negative for difficulty urinating and dysuria.  Musculoskeletal:  Negative for back pain and gait problem.  Skin:  Negative for rash.  Neurological:  Negative for dizziness, light-headedness and headaches.  Psychiatric/Behavioral:  Negative for agitation and behavioral problems.   All other systems reviewed and are negative.   Per HPI unless specifically indicated above   Allergies as of 10/06/2023       Reactions   Hydromet [hydrocodone  Bit-homatrop Mbr]    Pt doesn't remember   Ofev  [nintedanib] Diarrhea   Toprol  Xl [metoprolol ] Hives   Facial rash, itching, hives   Amoxicillin Rash   Did it involve swelling of the face/tongue/throat, SOB, or low BP? No Did it involve sudden or severe rash/hives, skin peeling, or any reaction on the inside of your mouth or nose? Unknown Did you need to seek medical attention at a hospital or doctor's office? Unknown When did it last happen?      10 + years If all above answers are "NO", may proceed with cephalosporin use.        Medication List        Accurate as of October 06, 2023 11:38 AM. If you have any questions, ask your nurse or doctor.          acetaminophen  500  MG tablet Commonly known as: TYLENOL  Take 1 tablet (500 mg total) by mouth every 6 (six) hours as needed.   atorvastatin  20 MG tablet Commonly known as: LIPITOR Take 1 tablet (20 mg total) by mouth daily.   budesonide  180 MCG/ACT inhaler Commonly known as: PULMICORT  Inhale 2 puffs into the lungs in the morning and at bedtime.   cetirizine  10 MG tablet Commonly known as: ZYRTEC  Take 1 tablet (10 mg total) by mouth daily as needed for allergies.   diltiazem  120 MG 24 hr  capsule Commonly known as: CARDIZEM  CD Take 1 capsule (120 mg total) by mouth daily.   Eliquis  5 MG Tabs tablet Generic drug: apixaban  Take 1 tablet by mouth twice daily   hydrochlorothiazide  25 MG tablet Commonly known as: HYDRODIURIL  Take 1 tablet (25 mg total) by mouth daily.   ibuprofen 200 MG tablet Commonly known as: ADVIL Take 400 mg by mouth every 6 (six) hours as needed for headache or moderate pain.   ipratropium-albuterol  0.5-2.5 (3) MG/3ML Soln Commonly known as: DUONEB Take 3 mLs by nebulization every 6 (six) hours as needed.   levothyroxine  50 MCG tablet Commonly known as: SYNTHROID  Take 1 tablet (50 mcg total) by mouth daily before breakfast.   losartan  50 MG tablet Commonly known as: COZAAR  Take 1 tablet (50 mg total) by mouth daily.   Omega 3 1000 MG Caps Take 1,000 mg by mouth daily.   ondansetron  8 MG disintegrating tablet Commonly known as: ZOFRAN -ODT Take 1 tablet (8 mg total) by mouth every 6 (six) hours as needed for nausea or vomiting.   pantoprazole  40 MG tablet Commonly known as: PROTONIX  Take 1 tablet (40 mg total) by mouth daily.   Pirfenidone  267 MG Tabs Take 1 tab three times daily for 7 days, then 2 tabs three times daily thereafter **low dose as maintenance*   Pirfenidone  267 MG Tabs Take 2 tablets (534 mg total) by mouth 3 (three) times daily with meals.   potassium chloride  SA 20 MEQ tablet Commonly known as: KLOR-CON  M Take 1 tablet (20 mEq total) by mouth daily.         Objective:   BP 129/75   Pulse 77   Temp 97.9 F (36.6 C)   Ht 5' 5 (1.651 m)   Wt 136 lb (61.7 kg)   SpO2 96%   BMI 22.63 kg/m   Wt Readings from Last 3 Encounters:  10/06/23 136 lb (61.7 kg)  09/04/23 135 lb (61.2 kg)  07/14/23 131 lb (59.4 kg)    Physical Exam Vitals and nursing note reviewed.  Constitutional:      General: She is not in acute distress.    Appearance: She is well-developed. She is not diaphoretic.   Eyes:      Conjunctiva/sclera: Conjunctivae normal.    Cardiovascular:     Rate and Rhythm: Normal rate and regular rhythm.     Heart sounds: Normal heart sounds. No murmur heard. Pulmonary:     Effort: Pulmonary effort is normal. No respiratory distress.     Breath sounds: Normal breath sounds. No wheezing.   Musculoskeletal:        General: No swelling.   Skin:    General: Skin is warm and dry.     Findings: No rash.   Neurological:     Mental Status: She is alert and oriented to person, place, and time.     Coordination: Coordination normal.   Psychiatric:        Behavior:  Behavior normal.       Assessment & Plan:   Problem List Items Addressed This Visit       Cardiovascular and Mediastinum   Hypertension (Chronic)   Relevant Medications   atorvastatin  (LIPITOR) 20 MG tablet   Other Relevant Orders   CMP14+EGFR     Digestive   Gastroesophageal reflux disease without esophagitis   Relevant Medications   pantoprazole  (PROTONIX ) 40 MG tablet   Other Relevant Orders   CBC with Differential/Platelet     Endocrine   Hypothyroidism   Relevant Orders   TSH     Other   Hyperlipidemia with target LDL less than 100 - Primary (Chronic)   Relevant Medications   atorvastatin  (LIPITOR) 20 MG tablet   Other Relevant Orders   Lipid panel   CBC with Differential/Platelet   Prediabetes   Relevant Orders   Bayer DCA Hb A1c Waived    Will check blood work on the way out, seems to be doing well. Follow up plan: Return in about 3 months (around 01/06/2024), or if symptoms worsen or fail to improve, for Prediabetes hyperlipidemia and hypothyroidism..  Counseling provided for all of the vaccine components Orders Placed This Encounter  Procedures   Bayer DCA Hb A1c Waived   CMP14+EGFR   Lipid panel   CBC with Differential/Platelet   TSH    Jolyne Needs, MD [facility] 10/06/2023, 11:38 AM

## 2023-10-07 LAB — CBC WITH DIFFERENTIAL/PLATELET
Basophils Absolute: 0 10*3/uL (ref 0.0–0.2)
Basos: 0 %
EOS (ABSOLUTE): 0.3 10*3/uL (ref 0.0–0.4)
Eos: 4 %
Hematocrit: 39.7 % (ref 34.0–46.6)
Hemoglobin: 12.6 g/dL (ref 11.1–15.9)
Immature Grans (Abs): 0 10*3/uL (ref 0.0–0.1)
Immature Granulocytes: 0 %
Lymphocytes Absolute: 1.4 10*3/uL (ref 0.7–3.1)
Lymphs: 20 %
MCH: 31 pg (ref 26.6–33.0)
MCHC: 31.7 g/dL (ref 31.5–35.7)
MCV: 98 fL — ABNORMAL HIGH (ref 79–97)
Monocytes Absolute: 1 10*3/uL — ABNORMAL HIGH (ref 0.1–0.9)
Monocytes: 14 %
Neutrophils Absolute: 4.5 10*3/uL (ref 1.4–7.0)
Neutrophils: 62 %
Platelets: 121 10*3/uL — ABNORMAL LOW (ref 150–450)
RBC: 4.06 x10E6/uL (ref 3.77–5.28)
RDW: 12.9 % (ref 11.7–15.4)
WBC: 7.2 10*3/uL (ref 3.4–10.8)

## 2023-10-07 LAB — LIPID PANEL
Chol/HDL Ratio: 3.1 ratio (ref 0.0–4.4)
Cholesterol, Total: 119 mg/dL (ref 100–199)
HDL: 39 mg/dL — ABNORMAL LOW (ref >39)
LDL Chol Calc (NIH): 61 mg/dL (ref 0–99)
Triglycerides: 98 mg/dL (ref 0–149)
VLDL Cholesterol Cal: 19 mg/dL (ref 5–40)

## 2023-10-07 LAB — CMP14+EGFR
ALT: 53 IU/L — ABNORMAL HIGH (ref 0–32)
AST: 48 IU/L — ABNORMAL HIGH (ref 0–40)
Albumin: 4 g/dL (ref 3.8–4.8)
Alkaline Phosphatase: 63 IU/L (ref 44–121)
BUN/Creatinine Ratio: 24 (ref 12–28)
BUN: 18 mg/dL (ref 8–27)
Bilirubin Total: 0.4 mg/dL (ref 0.0–1.2)
CO2: 23 mmol/L (ref 20–29)
Calcium: 9.2 mg/dL (ref 8.7–10.3)
Chloride: 106 mmol/L (ref 96–106)
Creatinine, Ser: 0.75 mg/dL (ref 0.57–1.00)
Globulin, Total: 2.1 g/dL (ref 1.5–4.5)
Glucose: 90 mg/dL (ref 70–99)
Potassium: 3.9 mmol/L (ref 3.5–5.2)
Sodium: 143 mmol/L (ref 134–144)
Total Protein: 6.1 g/dL (ref 6.0–8.5)
eGFR: 84 mL/min/{1.73_m2} (ref >59)

## 2023-10-07 LAB — TSH: TSH: 1.61 u[IU]/mL (ref 0.450–4.500)

## 2023-10-10 ENCOUNTER — Ambulatory Visit: Payer: Self-pay | Admitting: Family Medicine

## 2023-10-21 ENCOUNTER — Ambulatory Visit: Payer: Self-pay

## 2023-10-21 NOTE — Telephone Encounter (Signed)
 FYI Only or Action Required?: Action required by provider: clinical question for provider.  Patient is followed in Pulmonology for ILD, last seen on 09/04/2023 by Geronimo Amel, MD. Called Nurse Triage reporting Medication Reaction. Symptoms began a week ago. Interventions attempted: Other: decreased Rx dosage with sx relief. Symptoms are: unchanged.  Triage Disposition: Call Specialist Today  Patient/caregiver understands and will follow disposition?: Yes      Copied from CRM [id number]. Topic: Clinical - Red Word Triage >> Oct 21, 2023  1:42 PM Isabell A wrote: [facility] that prompted transfer to Nurse Triage: Son Arnolodo calling - states Pirfenidone  267 MG TABS medicine is making her dizzy, stomach aches and unable to get up and walk >> Oct 21, 2023  1:46 PM Isabell A wrote: Callback number - [phone]  Reason for Disposition  [1] Caller has URGENT medicine question about med that PCP or specialist prescribed AND [2] triager unable to answer question  Answer Assessment - Initial Assessment Questions 1. NAME of MEDICINE: What medicine(s) are you calling about?    Pirfenidone  267 MG TABS Son reports started getting sick once she starting taking 2 pills, 3x a day as instructed Reports she only took 1 today (sx improvement)  2. QUESTION: What is your question? (e.g., double dose of medicine, side effect)     Side effects: Stomach aches, H/A, weakness 3. PRESCRIBER: Who prescribed the medicine? Reason: if prescribed by specialist, call should be referred to that group.     Dr. Geronimo 4. SYMPTOMS: Do you have any symptoms? If Yes, ask: What symptoms are you having?  How bad are the symptoms (e.g., mild, moderate, severe)     Stomach aches, H/A, weakness, dizziness Currently feeling ok since lowering dosage 5. PREGNANCY:  Is there any chance that you are pregnant? When was your last menstrual period?     N/a   Triager will forward encounter for Dr Geronimo 's  office to review and advise. Caregiver verbalized understanding and is expecting call back from office for next steps. Triager also advised that if pt does not hear back from office, to call PCP for further evaluation/treatment.  Protocols used: Medication Question Call-A-AH

## 2023-10-21 NOTE — Telephone Encounter (Signed)
Please advise Dr. Chase Caller.

## 2023-10-22 ENCOUNTER — Other Ambulatory Visit: Payer: Self-pay

## 2023-10-23 NOTE — Telephone Encounter (Signed)
 Plan - Please find out how much pirfenidone  she is taking if she is taking all 3 pills 3 times daily - Ask her to stop the pirfenidone  - She has to call mid next week as to how she is feeling - Go to ER if she is getting worse  Please send the message back to me so I can track

## 2023-10-23 NOTE — Telephone Encounter (Signed)
 Tried calling and there was no answer and the voicemail was full, so unable to leave msg. Will call back.

## 2023-10-30 NOTE — Telephone Encounter (Addendum)
 Tried calling x2, no answer. Voicemail was full- unable to leave msg. Pt is not active on Mychart. Sending letter to patient regarding MR's questions.

## 2023-11-06 ENCOUNTER — Encounter

## 2023-11-11 ENCOUNTER — Other Ambulatory Visit: Payer: Self-pay

## 2023-11-11 NOTE — Progress Notes (Signed)
 Specialty Pharmacy Refill Coordination Note  Tasha [name] is a 73 y.o. female contacted today regarding refills of specialty medication(s) Pirfenidone    Patient requested Delivery   Delivery date: 11/14/23   Verified address: [address]  [place] [address]   Medication will be filled on 11/13/23.

## 2023-11-12 ENCOUNTER — Other Ambulatory Visit: Payer: Self-pay

## 2023-11-28 ENCOUNTER — Encounter

## 2023-11-28 ENCOUNTER — Ambulatory Visit: Admitting: Nurse Practitioner

## 2023-12-05 ENCOUNTER — Other Ambulatory Visit: Payer: Self-pay

## 2023-12-23 ENCOUNTER — Other Ambulatory Visit: Payer: Self-pay

## 2023-12-23 ENCOUNTER — Ambulatory Visit (INDEPENDENT_AMBULATORY_CARE_PROVIDER_SITE_OTHER): Admitting: Nurse Practitioner

## 2023-12-23 ENCOUNTER — Encounter: Payer: Self-pay | Admitting: Nurse Practitioner

## 2023-12-23 ENCOUNTER — Ambulatory Visit: Admitting: Internal Medicine

## 2023-12-23 VITALS — BP 120/70 | HR 74 | Ht 65.0 in | Wt 134.4 lb

## 2023-12-23 DIAGNOSIS — Z5181 Encounter for therapeutic drug level monitoring: Secondary | ICD-10-CM

## 2023-12-23 DIAGNOSIS — R768 Other specified abnormal immunological findings in serum: Secondary | ICD-10-CM

## 2023-12-23 DIAGNOSIS — J4520 Mild intermittent asthma, uncomplicated: Secondary | ICD-10-CM

## 2023-12-23 DIAGNOSIS — J849 Interstitial pulmonary disease, unspecified: Secondary | ICD-10-CM

## 2023-12-23 LAB — PULMONARY FUNCTION TEST
DL/VA % pred: 92 %
DL/VA: 3.91 ml/min/mmHg/L
DLCO cor % pred: 69 %
DLCO cor: 11.88 ml/min/mmHg
DLCO unc % pred: 69 %
DLCO unc: 11.88 ml/min/mmHg
FEF 25-75 Pre: 2.63 L/s
FEF2575-%Pred-Pre: 168 %
FEV1-%Pred-Pre: 98 %
FEV1-Pre: 1.83 L
FEV1FVC-%Pred-Pre: 112 %
FEV6-%Pred-Pre: 92 %
FEV6-Pre: 2.16 L
FEV6FVC-%Pred-Pre: 105 %
FVC-%Pred-Pre: 87 %
FVC-Pre: 2.16 L
Pre FEV1/FVC ratio: 85 %
Pre FEV6/FVC Ratio: 100 %

## 2023-12-23 LAB — BASIC METABOLIC PANEL WITH GFR
BUN: 15 mg/dL (ref 6–23)
CO2: 29 meq/L (ref 19–32)
Calcium: 9.4 mg/dL (ref 8.4–10.5)
Chloride: 104 meq/L (ref 96–112)
Creatinine, Ser: 0.72 mg/dL (ref 0.40–1.20)
GFR: 82.9 mL/min (ref >60.00)
Glucose, Bld: 101 mg/dL — ABNORMAL HIGH (ref 70–99)
Potassium: 3.9 meq/L (ref 3.5–5.1)
Sodium: 142 meq/L (ref 135–145)

## 2023-12-23 LAB — HEPATIC FUNCTION PANEL
ALT: 28 U/L (ref 0–35)
AST: 32 U/L (ref 0–37)
Albumin: 4.3 g/dL (ref 3.5–5.2)
Alkaline Phosphatase: 55 U/L (ref 39–117)
Bilirubin, Direct: 0.1 mg/dL (ref 0.0–0.3)
Total Bilirubin: 0.4 mg/dL (ref 0.2–1.2)
Total Protein: 7.2 g/dL (ref 6.0–8.3)

## 2023-12-23 NOTE — Assessment & Plan Note (Addendum)
 Compensated on regimen of PRN ICS and SABA. Rare use. Triggers include strong odors and cold weather. No recent exacerbations requiring steroids or abx. Action plan in place.

## 2023-12-23 NOTE — Progress Notes (Signed)
 Spirometry/diffusion capacity performed today.

## 2023-12-23 NOTE — Assessment & Plan Note (Signed)
 Progressive phenotype; behaving like IPF. She was started on antifibrotics with nintedanib in November 2023 but stopped 2024 due to side effects. Started low dose pirfenidone  May 2025. Difficulties tolerating 2 pills tid; now on 1 pills tid. Will retrial 2 pills tid and reassess response. If unable to tolerate, she will remain at 1 pill Three times a day. Clinically stable. Repeat spiro/DLCO upon return. Encouraged her to continue activity as tolerated. Recheck BMET/LFT today for medication monitoring; needs monthly monitoring for 6 months. Advised to return in one and two months for repeat draws.   Patient Instructions  -Try to increase pirfenidone  (Esbriet ) to 2 tablets, three times daily. If you have the same side effects with this, go back down to 1 tablet, three times a day and stay there. Take with food. Wear sunscreen when outside  -Ok use budesonide  inhaler Twice daily as needed for shortness of breath or wheezing -Continue duoneb 3 mL neb every 6 hours as needed for shortness of breath or wheezing.  -Continue cetirizine  (zyrtec ) 1 tab daily for allergies -Continue pantoprazole  1 tab daily for reflux   Labs today - CMET, hepatic function    Follow up in 12 weeks with Dr. Geronimo and repeat spirometry/DLCO beforehand. If symptoms worsen, please contact office for sooner follow up or seek emergency care.    Intente aumentar la dosis de pirfenidona (Esbriet ) a 2 comprimidos, tres veces al [employer]. Si presenta los mismos efectos secundarios, reduzca la dosis a 1 comprimido, tres veces al da y [place] as. Tmelo con alimentos. Use protector solar al doss rasher.  Se puede intentar usar el inhalador de budesonida dos veces al da segn sea necesario para la dificultad para respirar o sibilancias.  Contine con Duoneb 3 ml nebulizador cada 6 horas segn sea necesario para la dificultad para respirar o sibilancias.  Contine con cetirizina (Zyrtec ), 1 comprimido al da para  Environmental consultant.  Contine con pantoprazol, 1 comprimido al da para reflujo.  Anlisis de hoy: CMET, funcin heptica.  Consulte con el Dr. Geronimo en 12 semanas y repita la espirometra/DLCO previamente. Si los sntomas empeoran, comunquese con el consultorio para un seguimiento ms rpido o busque atencin de Associate Professor.

## 2023-12-23 NOTE — Patient Instructions (Signed)
 Spirometry and DLCO performed today.

## 2023-12-23 NOTE — Progress Notes (Signed)
 Specialty Pharmacy Refill Coordination Note  Tasha [name] is a 73 y.o. female contacted today regarding refills of specialty medication(s) Pirfenidone    Patient requested Delivery   Delivery date: 12/30/23   Verified address: [address]  [place] [address]   Medication will be filled on 12/29/23.

## 2023-12-23 NOTE — Patient Instructions (Addendum)
-  Try to increase pirfenidone  (Esbriet ) to 2 tablets, three times daily. If you have the same side effects with this, go back down to 1 tablet, three times a day and stay there. Take with food. Wear sunscreen when outside  -Ok use budesonide  inhaler Twice daily as needed for shortness of breath or wheezing -Continue duoneb 3 mL neb every 6 hours as needed for shortness of breath or wheezing.  -Continue cetirizine  (zyrtec ) 1 tab daily for allergies -Continue pantoprazole  1 tab daily for reflux   Labs today - CMET, hepatic function    Follow up in 12 weeks with Dr. Geronimo and repeat spirometry/DLCO beforehand. If symptoms worsen, please contact office for sooner follow up or seek emergency care.    Intente aumentar la dosis de pirfenidona (Esbriet ) a 2 comprimidos, tres veces al [employer]. Si presenta los mismos efectos secundarios, reduzca la dosis a 1 comprimido, tres veces al da y [place] as. Tmelo con alimentos. Use protector solar al doss rasher.  Se puede intentar usar el inhalador de budesonida dos veces al da segn sea necesario para la dificultad para respirar o sibilancias.  Contine con Duoneb 3 ml nebulizador cada 6 horas segn sea necesario para la dificultad para respirar o sibilancias.  Contine con cetirizina (Zyrtec ), 1 comprimido al da para alergias.  Contine con pantoprazol, 1 comprimido al da para reflujo.  Anlisis de hoy: CMET, funcin heptica.  Consulte con el Dr. Geronimo en 12 semanas y repita la espirometra/DLCO previamente. Si los sntomas empeoran, comunquese con el consultorio para un seguimiento ms rpido o busque atencin de Associate Professor.

## 2023-12-23 NOTE — Progress Notes (Addendum)
 @Patient  ID: Tasha [name], female    DOB: 02-26-1951, 73 y.o.   MRN: [id number]  Chief Complaint  Patient presents with   Medical Management of Chronic Issues    Pt states all has been well. Rx refill INH     Referring provider: Dettinger, Fonda LABOR, MD  HPI: 73 year old female, former smoker followed for ILD. She is a patient of Dr. Reeves and last seen in office 12/23/2023. Past medical history significant for environmental allergies, PAF, HTN, GERD, hypothyroid, prediabetes, HLD.  August 2019, suffered from acute lung injury possibly [place] based on the nature of infiltrates and a high ESR and positive ANA.  She had bronchoscopy with BAL but showed lymphocytosis.  There was some history of antifreeze exposure as well.  Following this, she was diagnosed with ILD.  Over the years, had progressive changes on CT imaging.  Most recently completed October 2023 with concern for UIP.  She was started on Ofev  in November 2023.  TEST/EVENTS:  06/11/2018 PFT: FVC 74, FEV1 90, ratio 92, DLCOunc 74 12/07/2019: Positive RAST for various grasses, IgE normal; eos 200 12/10/2019 PFT: FVC 79, FEV1 96, ratio 92.  Unable to complete DLCO.  No BD. 01/26/2020: Autoimmune workup negative aside from positive ANA 1:640 03/08/2021 echo: EF 55 to 60%.  RV size and function normal.  Normal PASP.  Trivial pericardial effusion.  Trivial MR. 04/27/2021 PFT: FVC 75, FEV1 88, ratio 89, DLCOcor 58 07/26/2021 PFT: FVC 78, FEV1 94, ratio 91, DLCO 63 01/26/2022 HRCT chest: Atherosclerosis.  No LAD.  Progressive interstitial lung disease; probable UIP. 02/07/2022 PFT: FVC 78, FEV1 95, ratio 92, DLCO 82 08/19/2023 HRCT chest: atherosclerosis. Heart is enlarged. Pulmonary parenchymal pattern of ILD, mildly progression from 01/25/2022. Probable UIP.  12/23/2023 PFT: FVC 87, FEV1 98, ratio 85, DLCO 69  09/04/2023: OV with Dr. Geronimo. Stable in interim. Fibrosis is progressive compared to October 2023. Stopped Ofev  before due to  symptom burden with weight loss, diarrhea and nausea. Currently all the Ofev  side effects are washed out. Discussed pirfenidone  as a next option.  Repeat LFT. Start low dose Esbriet  protocol. F/u 8 weeks.  12/23/2023: Today - follow up Discussed the use of AI scribe software for clinical note transcription with the patient, who gave verbal consent to proceed.  History of Present Illness Tasha [name] is a 73 year old female with pulmonary fibrosis who presents for follow-up on her medication regimen.  She is currently taking Esbriet  (pirfenidone ) at a dose of one tablet three times a day, with doses at 8 AM, 2:30 PM, and 7:30 PM. She experienced significant side effects, including fatigue and inability to get out of bed when the dose was increased after the first seven days. She backed down to 1 tablet Three times a day, and now tolerating the medication well, with manageable fatigue and no significant gastrointestinal upset.  She experiences coughing fits primarily in cold weather and in crowded environments with strong odors such as church. Strong perfumes and cleaning products trigger her cough. She uses her inhaler infrequently, about once a month, and reports no issues during activities such as shopping or going to the gym. She overall feels well. Breathing is stable. No significant shortness of breath. No further weight loss and appetite is good.   PFT with improved FVC and otherwise stable. No recent episodes required steroids or antibiotics for her breathing.     Allergies  Allergen Reactions   Hydromet [Hydrocodone  Bit-Homatrop Mbr]     Pt doesn't  remember   Ofev  [Nintedanib] Diarrhea   Toprol  Xl [Metoprolol ] Hives    Facial rash, itching, hives   Amoxicillin Rash    Did it involve swelling of the face/tongue/throat, SOB, or low BP? No Did it involve sudden or severe rash/hives, skin peeling, or any reaction on the inside of your mouth or nose? Unknown Did you need to seek medical  attention at a hospital or doctor's office? Unknown When did it last happen?      10 + years If all above answers are "NO", may proceed with cephalosporin use.     Immunization History  Administered Date(s) Administered   Fluad Quad(high Dose 65+) 03/01/2019, 01/14/2020, 01/16/2021, 02/07/2022   Fluad Trivalent(High Dose 65+) 01/01/2023   INFLUENZA, HIGH DOSE SEASONAL PF 03/11/2017, 03/09/2018   Influenza,inj,Quad PF,6+ Mos 02/04/2014, 02/09/2015, 02/20/2016   Moderna Sars-Covid-2 Vaccination 05/21/2019, 06/18/2019   Pneumococcal Conjugate-13 08/10/2015   Pneumococcal Polysaccharide-23 06/17/2017, 12/21/2017   Tdap 12/31/2021   Zoster Recombinant(Shingrix) 09/21/2020, 06/21/2021    Past Medical History:  Diagnosis Date   Acute respiratory failure (HCC)    CAP (community acquired pneumonia)    Hyperlipidemia    Hypertension    Metabolic syndrome     Tobacco History: Social History   Tobacco Use  Smoking Status Never   Passive exposure: Past  Smokeless Tobacco Never   Counseling given: Not Answered   Outpatient Medications Prior to Visit  Medication Sig Dispense Refill   acetaminophen  (TYLENOL ) 500 MG tablet Take 1 tablet (500 mg total) by mouth every 6 (six) hours as needed. 30 tablet 0   apixaban  (ELIQUIS ) 5 MG TABS tablet Take 1 tablet by mouth twice daily 180 tablet 1   atorvastatin  (LIPITOR) 20 MG tablet Take 1 tablet (20 mg total) by mouth daily. 90 tablet 3   budesonide  (PULMICORT ) 180 MCG/ACT inhaler Inhale 2 puffs into the lungs in the morning and at bedtime. 1 each 5   cetirizine  (ZYRTEC ) 10 MG tablet Take 1 tablet (10 mg total) by mouth daily as needed for allergies. 90 tablet 3   diltiazem  (CARDIZEM  CD) 120 MG 24 hr capsule Take 1 capsule (120 mg total) by mouth daily. 90 capsule 3   hydrochlorothiazide  (HYDRODIURIL ) 25 MG tablet Take 1 tablet (25 mg total) by mouth daily. 90 tablet 3   ibuprofen (ADVIL) 200 MG tablet Take 400 mg by mouth every 6 (six) hours  as needed for headache or moderate pain.     ipratropium-albuterol  (DUONEB) 0.5-2.5 (3) MG/3ML SOLN Take 3 mLs by nebulization every 6 (six) hours as needed.     levothyroxine  (SYNTHROID ) 50 MCG tablet Take 1 tablet (50 mcg total) by mouth daily before breakfast. 90 tablet 3   losartan  (COZAAR ) 50 MG tablet Take 1 tablet (50 mg total) by mouth daily. 90 tablet 3   ondansetron  (ZOFRAN -ODT) 8 MG disintegrating tablet Take 1 tablet (8 mg total) by mouth every 6 (six) hours as needed for nausea or vomiting. 20 tablet 1   pantoprazole  (PROTONIX ) 40 MG tablet Take 1 tablet (40 mg total) by mouth daily. 90 tablet 3   Pirfenidone  267 MG TABS Take 1 tab three times daily for 7 days, then 2 tabs three times daily thereafter **low dose as maintenance* 159 tablet 0   Pirfenidone  267 MG TABS Take 2 tablets (534 mg total) by mouth 3 (three) times daily with meals. 180 tablet 2   potassium chloride  SA (KLOR-CON  M) 20 MEQ tablet Take 1 tablet (20 mEq total) by mouth  daily. 90 tablet 3   Omega 3 1000 MG CAPS Take 1,000 mg by mouth daily.  (Patient not taking: Reported on 12/23/2023)     No facility-administered medications prior to visit.     Review of Systems:   Constitutional: No weight loss or gain, night sweats, fevers, chills, fatigue, or lassitude. HEENT: No headaches, difficulty swallowing, tooth/dental problems, or sore throat. No sneezing, itching, ear ache, nasal congestion, or post nasal drip CV:  No chest pain, orthopnea, PND, swelling in lower extremities, anasarca, dizziness, palpitations, syncope Resp: No shortness of breath with activity or rest; occasional cough. No excess mucus or change in color of mucus. No hemoptysis. No wheezing.  No chest wall deformity GI:  No heartburn, indigestion MSK:  No joint pain or swelling.   Neuro: No dizziness or lightheadedness.  Psych: No depression or anxiety. Mood stable.     Physical Exam:  BP 120/70   Pulse 74   Ht 5' 5 (1.651 m)   Wt 134 lb 6.4  oz (61 kg)   SpO2 98%   BMI 22.37 kg/m   GEN: Pleasant, interactive, well-appearing; in no acute distress HEENT:  Normocephalic and atraumatic. PERRLA. Sclera white. Nasal turbinates pink, moist and patent bilaterally. No rhinorrhea present. Oropharynx pink and moist, without exudate or edema. No lesions, ulcerations, or postnasal drip.  NECK:  Supple w/ fair ROM. No JVD present. No lymphadenopathy.   CV: RRR, no m/r/g, no peripheral edema. Pulses intact, +2 bilaterally. No cyanosis, pallor or clubbing. PULMONARY:  Unlabored, regular breathing. Bibasilar crackles bilaterally otherwise clear A&P w/o wheezes/rales/rhonchi. No accessory muscle use.  GI: BS present and normoactive. Soft, non-tender to palpation. No organomegaly or masses detected.  MSK: No erythema, warmth or tenderness. Cap refil <2 sec all extrem.  Neuro: A/Ox3. No focal deficits noted.   Skin: Warm, no lesions or rashe Psych: Normal affect and behavior. Judgement and thought content appropriate.     Lab Results:  CBC    Component Value Date/Time   WBC 7.2 10/06/2023 1150   WBC 9.1 09/06/2020 1429   RBC 4.06 10/06/2023 1150   RBC 4.25 09/06/2020 1429   HGB 12.6 10/06/2023 1150   HCT 39.7 10/06/2023 1150   PLT 121 (L) 10/06/2023 1150   MCV 98 (H) 10/06/2023 1150   MCH 31.0 10/06/2023 1150   MCH 30.8 09/06/2020 1429   MCHC 31.7 10/06/2023 1150   MCHC 33.3 09/06/2020 1429   RDW 12.9 10/06/2023 1150   LYMPHSABS 1.4 10/06/2023 1150   MONOABS 1.0 12/07/2019 1155   EOSABS 0.3 10/06/2023 1150   BASOSABS 0.0 10/06/2023 1150    BMET    Component Value Date/Time   NA 143 10/06/2023 1150   K 3.9 10/06/2023 1150   CL 106 10/06/2023 1150   CO2 23 10/06/2023 1150   GLUCOSE 90 10/06/2023 1150   GLUCOSE 88 06/14/2022 1059   BUN 18 10/06/2023 1150   CREATININE 0.75 10/06/2023 1150   CREATININE 0.70 09/23/2012 1335   CALCIUM  9.2 10/06/2023 1150   GFRNONAA >60 01/31/2021 1304   GFRNONAA >89 09/23/2012 1335   GFRAA  93 03/23/2020 1042   GFRAA >89 09/23/2012 1335    BNP    Component Value Date/Time   BNP 77.7 12/16/2017 1924     Imaging:  No results found.  Administration History     None          Latest Ref Rng & Units 09/05/2023    9:57 AM 12/26/2022    2:52  PM 06/14/2022    9:49 AM 02/07/2022    1:39 PM 07/26/2021    9:08 AM 04/27/2021   10:21 AM 12/10/2019   12:24 PM  PFT Results  FVC-Pre L 2.16  P 2.18  2.12  2.04  2.05  1.98  2.13   FVC-Predicted Pre % 87  P 86  83  78  78  75  79   FVC-Post L       2.18   FVC-Predicted Post %       81   Pre FEV1/FVC % % 85  P 50  90  92  91  89  91   Post FEV1/FCV % %       92   FEV1-Pre L 1.83  P 1.08  1.91  1.88  1.86  1.77  1.94   FEV1-Predicted Pre % 98  P 57  99  95  94  88  96   FEV1-Post L       2.01   DLCO uncorrected ml/min/mmHg 11.88  P 12.58  11.51  14.38  11.06  10.43    DLCO UNC% % 69  P 73  67  82  63  59    DLCO corrected ml/min/mmHg 11.88  P 12.58  11.51  14.38  11.06  10.28    DLCO COR %Predicted % 69  P 73  67  82  63  58    DLVA Predicted % 92  P 95  84  114  83  80      P Preliminary result    No results found for: NITRICOXIDE      Assessment & Plan:   ILD (interstitial lung disease) (HCC) Progressive phenotype; behaving like IPF. She was started on antifibrotics with nintedanib in November 2023 but stopped 2024 due to side effects. Started low dose pirfenidone  May 2025. Difficulties tolerating 2 pills tid; now on 1 pills tid. Will retrial 2 pills tid and reassess response. If unable to tolerate, she will remain at 1 pill Three times a day. Clinically stable. Repeat spiro/DLCO upon return. Encouraged her to continue activity as tolerated. Recheck BMET/LFT today for medication monitoring; needs monthly monitoring for 6 months. Advised to return in one and two months for repeat draws.   Patient Instructions  -Try to increase pirfenidone  (Esbriet ) to 2 tablets, three times daily. If you have the same side effects with  this, go back down to 1 tablet, three times a day and stay there. Take with food. Wear sunscreen when outside  -Ok use budesonide  inhaler Twice daily as needed for shortness of breath or wheezing -Continue duoneb 3 mL neb every 6 hours as needed for shortness of breath or wheezing.  -Continue cetirizine  (zyrtec ) 1 tab daily for allergies -Continue pantoprazole  1 tab daily for reflux   Labs today - CMET, hepatic function    Follow up in 12 weeks with Dr. Geronimo and repeat spirometry/DLCO beforehand. If symptoms worsen, please contact office for sooner follow up or seek emergency care.    Intente aumentar la dosis de pirfenidona (Esbriet ) a 2 comprimidos, tres veces al [employer]. Si presenta los mismos efectos secundarios, reduzca la dosis a 1 comprimido, tres veces al da y [place] as. Tmelo con alimentos. Use protector solar al doss rasher.  Se puede intentar usar el inhalador de budesonida dos veces al da segn sea necesario para la dificultad para respirar o sibilancias.  Contine con Duoneb 3 ml nebulizador cada 6 horas segn sea necesario  para la dificultad para respirar o sibilancias.  Contine con cetirizina (Zyrtec ), 1 comprimido al da para Environmental consultant.  Contine con pantoprazol, 1 comprimido al da para reflujo.  Anlisis de hoy: CMET, funcin heptica.  Consulte con el Dr. Geronimo en 12 semanas y repita la espirometra/DLCO previamente. Si los sntomas empeoran, comunquese con el consultorio para un seguimiento ms rpido o busque atencin de Associate Professor.    Allergy-induced asthma Compensated on regimen of PRN ICS and SABA. Rare use. Triggers include strong odors and cold weather. No recent exacerbations requiring steroids or abx. Action plan in place.    I spent 35 minutes of dedicated to the care of this patient on the date of this encounter to include pre-visit review of records, face-to-face time with the patient discussing conditions above, post visit ordering of  testing, clinical documentation with the electronic health record, making appropriate referrals as documented, and communicating necessary findings to members of the patients care team.  Comer LULLA Rouleau, NP 12/23/2023  Pt aware and understands NP's role.

## 2023-12-25 ENCOUNTER — Ambulatory Visit: Payer: Self-pay | Admitting: Nurse Practitioner

## 2023-12-25 DIAGNOSIS — J849 Interstitial pulmonary disease, unspecified: Secondary | ICD-10-CM

## 2023-12-25 NOTE — Progress Notes (Signed)
 No answer

## 2023-12-25 NOTE — Progress Notes (Signed)
 Labs stable. Repeat BMET and hepatic function panel 1 month as previously recommended.

## 2023-12-29 ENCOUNTER — Other Ambulatory Visit: Payer: Self-pay

## 2023-12-29 ENCOUNTER — Other Ambulatory Visit (HOSPITAL_COMMUNITY): Payer: Self-pay

## 2024-01-12 ENCOUNTER — Ambulatory Visit: Admitting: Family Medicine

## 2024-01-12 ENCOUNTER — Encounter: Payer: Self-pay | Admitting: Family Medicine

## 2024-01-12 VITALS — BP 130/73 | HR 89 | Temp 97.8°F | Ht 65.0 in | Wt 134.6 lb

## 2024-01-12 DIAGNOSIS — R7303 Prediabetes: Secondary | ICD-10-CM

## 2024-01-12 DIAGNOSIS — Z23 Encounter for immunization: Secondary | ICD-10-CM | POA: Diagnosis not present

## 2024-01-12 DIAGNOSIS — J4520 Mild intermittent asthma, uncomplicated: Secondary | ICD-10-CM | POA: Diagnosis not present

## 2024-01-12 DIAGNOSIS — I10 Essential (primary) hypertension: Secondary | ICD-10-CM | POA: Diagnosis not present

## 2024-01-12 DIAGNOSIS — E039 Hypothyroidism, unspecified: Secondary | ICD-10-CM

## 2024-01-12 DIAGNOSIS — J849 Interstitial pulmonary disease, unspecified: Secondary | ICD-10-CM | POA: Diagnosis not present

## 2024-01-12 DIAGNOSIS — E785 Hyperlipidemia, unspecified: Secondary | ICD-10-CM

## 2024-01-12 DIAGNOSIS — I4819 Other persistent atrial fibrillation: Secondary | ICD-10-CM

## 2024-01-12 LAB — BAYER DCA HB A1C WAIVED: HB A1C (BAYER DCA - WAIVED): 5.2 % (ref 4.8–5.6)

## 2024-01-12 MED ORDER — LOSARTAN POTASSIUM 50 MG PO TABS: 50.0000 mg | ORAL_TABLET | Freq: Every day | ORAL | 3 refills | Status: AC

## 2024-01-12 MED ORDER — BUDESONIDE 180 MCG/ACT IN AEPB: 2.0000 | INHALATION_SPRAY | Freq: Two times a day (BID) | RESPIRATORY_TRACT | 5 refills | Status: DC

## 2024-01-12 NOTE — Progress Notes (Signed)
 BP 130/73   Pulse 89   Temp 97.8 F (36.6 C)   Ht 5' 5 (1.651 m)   Wt 134 lb 9.6 oz (61.1 kg)   SpO2 98%   BMI 22.40 kg/m    Subjective:   [name], female    DOB: March 24, 1951, 73 y.o.   MRN: [id number]  HPI: Tasha [name] is a 73 y.o. female presenting on 01/12/2024 for Hypertension, Hyperlipidemia, and Prediabetes   Discussed the use of AI scribe software for clinical note transcription with the patient, who gave verbal consent to proceed.  History of Present Illness   Tasha [name] is a 73 year old female who presents for a follow-up to check her blood sugar and other health parameters.  Her most recent A1c was 5.2.  She was prescribed 'Pero fenadol', initially taking one pill three times a day for seven days, then increased to two pills three times a day. She experienced some issues with the increased dosage and reverted to one pill. After consulting with her doctor, she attempted the two-pill regimen again and has been tolerating it well for nearly a month. She takes the medication at 8 AM and 2 PM to space out the doses more evenly.  She uses Pulmicort  for her respiratory symptoms, which worsen in cold weather, necessitating the use of a spray. She notes improvement in her symptoms when the weather is warm. She is running low on Pulmicort .  She mentions experiencing occasional bruising on her feet, which she suspects might be related to her blood medication. She continues to take her heart and blood pressure medications, including hydrochlorothiazide , diltiazem , and losartan .  She takes pantoprazole  for acid reflux, particularly when consuming spicy foods, and finds relief with yogurt. She is uncertain whether to take pantoprazole  daily or as needed, depending on the frequency of her symptoms.  She monitors her physical activity and sleep using a mobile device, noting that her heart rate occasionally increases with stress or activity but generally returns  to normal.          Relevant past medical, surgical, family and social history reviewed and updated as indicated. Interim medical history since our last visit reviewed. Allergies and medications reviewed and updated.  Review of Systems  Constitutional:  Negative for chills and fever.  Eyes:  Negative for visual disturbance.  Respiratory:  Negative for chest tightness and shortness of breath.   Cardiovascular:  Negative for chest pain and leg swelling.  Genitourinary:  Negative for difficulty urinating and dysuria.  Musculoskeletal:  Negative for back pain and gait problem.  Skin:  Negative for rash.  Neurological:  Negative for dizziness, light-headedness and headaches.  Psychiatric/Behavioral:  Negative for agitation and behavioral problems.   All other systems reviewed and are negative.   Per HPI unless specifically indicated above   Allergies as of 01/12/2024       Reactions   Hydromet [hydrocodone  Bit-homatrop Mbr]    Pt doesn't remember   Ofev  [nintedanib] Diarrhea   Toprol  Xl [metoprolol ] Hives   Facial rash, itching, hives   Amoxicillin Rash   Did it involve swelling of the face/tongue/throat, SOB, or low BP? No Did it involve sudden or severe rash/hives, skin peeling, or any reaction on the inside of your mouth or nose? Unknown Did you need to seek medical attention at a hospital or doctor's office? Unknown When did it last happen?      10 + years If all above answers are "NO", may proceed  with cephalosporin use.        Medication List        Accurate as of January 12, 2024 11:01 AM. If you have any questions, ask your nurse or doctor.          STOP taking these medications    Omega 3 1000 MG Caps Stopped by: Fonda LABOR Tasha [name]       TAKE these medications    acetaminophen  500 MG tablet Commonly known as: TYLENOL  Take 1 tablet (500 mg total) by mouth every 6 (six) hours as needed.   atorvastatin  20 MG tablet Commonly known as: LIPITOR Take  1 tablet (20 mg total) by mouth daily.   budesonide  180 MCG/ACT inhaler Commonly known as: PULMICORT  Inhale 2 puffs into the lungs in the morning and at bedtime.   cetirizine  10 MG tablet Commonly known as: ZYRTEC  Take 1 tablet (10 mg total) by mouth daily as needed for allergies.   diltiazem  120 MG 24 hr capsule Commonly known as: CARDIZEM  CD Take 1 capsule (120 mg total) by mouth daily.   Eliquis  5 MG Tabs tablet Generic drug: apixaban  Take 1 tablet by mouth twice daily   hydrochlorothiazide  25 MG tablet Commonly known as: HYDRODIURIL  Take 1 tablet (25 mg total) by mouth daily.   ibuprofen 200 MG tablet Commonly known as: ADVIL Take 400 mg by mouth every 6 (six) hours as needed for headache or moderate pain.   ipratropium-albuterol  0.5-2.5 (3) MG/3ML Soln Commonly known as: DUONEB Take 3 mLs by nebulization every 6 (six) hours as needed.   levothyroxine  50 MCG tablet Commonly known as: SYNTHROID  Take 1 tablet (50 mcg total) by mouth daily before breakfast.   losartan  50 MG tablet Commonly known as: COZAAR  Take 1 tablet (50 mg total) by mouth daily.   ondansetron  8 MG disintegrating tablet Commonly known as: ZOFRAN -ODT Take 1 tablet (8 mg total) by mouth every 6 (six) hours as needed for nausea or vomiting.   pantoprazole  40 MG tablet Commonly known as: PROTONIX  Take 1 tablet (40 mg total) by mouth daily.   Pirfenidone  267 MG Tabs Take 1 tab three times daily for 7 days, then 2 tabs three times daily thereafter **low dose as maintenance*   Pirfenidone  267 MG Tabs Take 2 tablets (534 mg total) by mouth 3 (three) times daily with meals.   potassium chloride  SA 20 MEQ tablet Commonly known as: KLOR-CON  M Take 1 tablet (20 mEq total) by mouth daily.         Objective:   BP 130/73   Pulse 89   Temp 97.8 F (36.6 C)   Ht 5' 5 (1.651 m)   Wt 134 lb 9.6 oz (61.1 kg)   SpO2 98%   BMI 22.40 kg/m   Wt Readings from Last 3 Encounters:  01/12/24 134 lb  9.6 oz (61.1 kg)  12/23/23 134 lb 6.4 oz (61 kg)  10/06/23 136 lb (61.7 kg)    Physical Exam Vitals and nursing note reviewed.  Constitutional:      General: She is not in acute distress.    Appearance: She is well-developed. She is not diaphoretic.  Eyes:     Conjunctiva/sclera: Conjunctivae normal.  Cardiovascular:     Rate and Rhythm: Normal rate and regular rhythm.     Heart sounds: Normal heart sounds. No murmur heard. Pulmonary:     Effort: Pulmonary effort is normal. No respiratory distress.     Breath sounds: Normal breath sounds. No wheezing.  Musculoskeletal:  General: No swelling.  Skin:    General: Skin is warm and dry.     Findings: No rash.  Neurological:     Mental Status: She is alert and oriented to person, place, and time.     Coordination: Coordination normal.  Psychiatric:        Behavior: Behavior normal.                Assessment & Plan:   Problem List Items Addressed This Visit       Cardiovascular and Mediastinum   Hypertension (Chronic)   Relevant Medications   losartan  (COZAAR ) 50 MG tablet   Persistent atrial fibrillation (HCC)   Relevant Medications   losartan  (COZAAR ) 50 MG tablet     Respiratory   Allergy-induced asthma   Relevant Medications   budesonide  (PULMICORT ) 180 MCG/ACT inhaler   ILD (interstitial lung disease) (HCC)     Endocrine   Hypothyroidism     Other   Hyperlipidemia with target LDL less than 100 - Primary (Chronic)   Relevant Medications   losartan  (COZAAR ) 50 MG tablet   Prediabetes   Relevant Medications   losartan  (COZAAR ) 50 MG tablet   Other Relevant Orders   Bayer DCA Hb A1c Waived   Other Visit Diagnoses       Encounter for immunization       Relevant Orders   Flu vaccine HIGH DOSE PF(Fluzone Trivalent) (Completed)          Atrial fibrillation No new symptoms reported. - Continue current medication regimen including diltiazem .  Essential hypertension Blood pressure is  well-controlled at 130/73 mmHg. - Continue current antihypertensive regimen.  Interstitial pulmonary disease Respiratory status is well-managed with improvement on current regimen. - Send refill for Pulmicort  inhaler.  Gastroesophageal reflux disease (GERD) Symptoms occur primarily with spicy foods and are relieved by yogurt and pantoprazole  as needed. - Use pantoprazole  as needed based on symptom frequency.          Follow up plan: Return in about 3 months (around 04/12/2024), or if symptoms worsen or fail to improve, for Prediabetes and hyperlipidemia and hypertension.  Counseling provided for all of the vaccine components Orders Placed This Encounter  Procedures   Flu vaccine HIGH DOSE PF(Fluzone Trivalent)   Bayer DCA Hb A1c Waived    Fonda Levins, MD [facility] 01/12/2024, 11:01 AM

## 2024-01-22 ENCOUNTER — Other Ambulatory Visit

## 2024-01-22 DIAGNOSIS — Z5181 Encounter for therapeutic drug level monitoring: Secondary | ICD-10-CM

## 2024-01-22 NOTE — Addendum Note (Signed)
 Addended by: ARLYN FANG D on: 01/22/2024 10:21 AM   Modules accepted: Orders

## 2024-01-23 LAB — HEPATIC FUNCTION PANEL
ALT: 24 IU/L (ref 0–32)
AST: 31 IU/L (ref 0–40)
Albumin: 4.3 g/dL (ref 3.8–4.8)
Alkaline Phosphatase: 69 IU/L (ref 49–135)
Bilirubin Total: 0.3 mg/dL (ref 0.0–1.2)
Bilirubin, Direct: <0.08 mg/dL (ref 0.00–0.40)
Total Protein: 6.6 g/dL (ref 6.0–8.5)

## 2024-01-23 LAB — BASIC METABOLIC PANEL WITH GFR
BUN/Creatinine Ratio: 14 (ref 12–28)
BUN: 13 mg/dL (ref 8–27)
CO2: 21 mmol/L (ref 20–29)
Calcium: 9.4 mg/dL (ref 8.7–10.3)
Chloride: 102 mmol/L (ref 96–106)
Creatinine, Ser: 0.91 mg/dL (ref 0.57–1.00)
Glucose: 122 mg/dL — ABNORMAL HIGH (ref 70–99)
Potassium: 3.8 mmol/L (ref 3.5–5.2)
Sodium: 140 mmol/L (ref 134–144)
eGFR: 67 mL/min/1.73 (ref >59)

## 2024-01-25 ENCOUNTER — Other Ambulatory Visit: Payer: Self-pay | Admitting: Internal Medicine

## 2024-01-25 DIAGNOSIS — J849 Interstitial pulmonary disease, unspecified: Secondary | ICD-10-CM

## 2024-01-26 ENCOUNTER — Other Ambulatory Visit: Payer: Self-pay

## 2024-01-26 MED ORDER — PIRFENIDONE 267 MG PO TABS
534.0000 mg | ORAL_TABLET | Freq: Three times a day (TID) | ORAL | 2 refills | Status: DC
  Filled 2024-01-26 – 2024-01-29 (×2): qty 180, 30d supply, fill #0
  Filled 2024-02-20 – 2024-02-25 (×2): qty 180, 30d supply, fill #1
  Filled 2024-03-19: qty 180, 30d supply, fill #2

## 2024-01-26 NOTE — Telephone Encounter (Signed)
 Pt is requesting refill of specialty medication - routing to Rx team to advise.

## 2024-01-26 NOTE — Telephone Encounter (Signed)
 Refill sent for PIRFENIDONE  to [facility]: [phone]   Prescribed dose: 534mg  by mouth three times daily    Called patient to confirm dosing (Interpretor # [phone])- per last OV note on 12/23/23, patient had difficulty taking prescribed dose. Patient confirms she is able to take prescribed dose without side effects.   Last OV: 12/23/23 Provider: Dr. Geronimo Pertinent labs: LFTs wnl 01/22/24  Next OV: 03/25/24  Aleck Puls, PharmD, BCPS Clinical Pharmacist  [facility]

## 2024-01-27 ENCOUNTER — Telehealth: Payer: Self-pay

## 2024-01-27 DIAGNOSIS — J4520 Mild intermittent asthma, uncomplicated: Secondary | ICD-10-CM

## 2024-01-27 NOTE — Telephone Encounter (Unsigned)
 Copied from CRM [id number]. Topic: Clinical - Prescription Issue >> Jan 27, 2024  1:33 PM Rosaria BRAVO wrote: Reason for CRM: Inhaler no longer covered by insurance. budesonide  (PULMICORT ) 180 MCG/ACT inhaler  They need another prescription for an inhaler that will be covered.  [facility] - [place], [place] - [address] [place] [address] Phone: [phone] Fax: [phone]  Best contact: [phone] son Maguin

## 2024-01-29 ENCOUNTER — Other Ambulatory Visit: Payer: Self-pay

## 2024-01-29 MED ORDER — FLUTICASONE PROPIONATE HFA 110 MCG/ACT IN AERO: 1.0000 | INHALATION_SPRAY | Freq: Two times a day (BID) | RESPIRATORY_TRACT | 12 refills | Status: DC

## 2024-01-29 NOTE — Telephone Encounter (Signed)
 Sent a replacement inhaler because of insurance coverage

## 2024-01-29 NOTE — Progress Notes (Signed)
 Specialty Pharmacy Refill Coordination Note  Tasha [name] is a 73 y.o. female contacted today regarding refills of specialty medication(s) Pirfenidone    Patient requested Delivery   Delivery date: 01/30/24   Verified address: [address]  [place] [address]   Medication will be filled on 10.09.25.   Used interpreter services

## 2024-01-29 NOTE — Telephone Encounter (Signed)
 I called patient and tried to tell her that you sent in a replacement but patient only speaks spanish and she couldn't understand what I was saying.

## 2024-01-29 NOTE — Progress Notes (Signed)
 Patient taking pirfenidone  534mg  three times daily (low-dose)  Has diarrhea with Ofev   Check CMP weekly x 6 months then every 3 months thereafter  Sherry Pennant, PharmD, MPH, BCPS, CPP Clinical Pharmacist [facility])

## 2024-01-29 NOTE — Progress Notes (Signed)
 Labs stable. Repeat BMET and hepatic function panel in 4 weeks. Thanks.

## 2024-01-29 NOTE — Telephone Encounter (Signed)
 Carlos from interpreter line spoke with patient and gave patient the message. Patient voiced understanding and said that she would pick Rx up tomorrow.

## 2024-01-30 NOTE — Progress Notes (Signed)
 Called and spoke to pt using interpretor [place], [phone]) - advised of results per Izetta Rouleau. Pt verbalized understanding, NFN.  Orders placed.

## 2024-02-12 ENCOUNTER — Other Ambulatory Visit: Payer: Self-pay | Admitting: Internal Medicine

## 2024-02-12 NOTE — Telephone Encounter (Signed)
 Prescription refill request for Eliquis  received. Indication:afib Last office visit:1/25 Scr:0.91  10/25 Age: 73 Weight:61.1  kg  Prescription refilled

## 2024-02-20 ENCOUNTER — Other Ambulatory Visit: Payer: Self-pay

## 2024-02-24 ENCOUNTER — Other Ambulatory Visit (HOSPITAL_COMMUNITY): Payer: Self-pay

## 2024-02-24 ENCOUNTER — Other Ambulatory Visit

## 2024-02-24 DIAGNOSIS — J849 Interstitial pulmonary disease, unspecified: Secondary | ICD-10-CM | POA: Diagnosis not present

## 2024-02-25 ENCOUNTER — Other Ambulatory Visit: Payer: Self-pay

## 2024-02-25 LAB — BASIC METABOLIC PANEL WITH GFR
BUN/Creatinine Ratio: 21 (ref 12–28)
BUN: 19 mg/dL (ref 8–27)
CO2: 25 mmol/L (ref 20–29)
Calcium: 9.4 mg/dL (ref 8.7–10.3)
Chloride: 102 mmol/L (ref 96–106)
Creatinine, Ser: 0.89 mg/dL (ref 0.57–1.00)
Glucose: 84 mg/dL (ref 70–99)
Potassium: 3.9 mmol/L (ref 3.5–5.2)
Sodium: 140 mmol/L (ref 134–144)
eGFR: 68 mL/min/1.73 (ref >59)

## 2024-02-25 LAB — HEPATIC FUNCTION PANEL
ALT: 20 IU/L (ref 0–32)
AST: 29 IU/L (ref 0–40)
Albumin: 4.3 g/dL (ref 3.8–4.8)
Alkaline Phosphatase: 61 IU/L (ref 49–135)
Bilirubin Total: 0.2 mg/dL (ref 0.0–1.2)
Bilirubin, Direct: <0.08 mg/dL (ref 0.00–0.40)
Total Protein: 6.3 g/dL (ref 6.0–8.5)

## 2024-02-25 NOTE — Progress Notes (Signed)
 Specialty Pharmacy Refill Coordination Note  Tasha [name] is a 73 y.o. female contacted today regarding refills of specialty medication(s) Pirfenidone   Spoke with patient via interpreter   Patient requested Delivery   Delivery date: 02/26/24   Verified address: [address]  [place] [address]   Medication will be filled on: 02/25/24

## 2024-02-27 ENCOUNTER — Ambulatory Visit: Payer: Self-pay | Admitting: Nurse Practitioner

## 2024-03-05 ENCOUNTER — Ambulatory Visit: Payer: Self-pay

## 2024-03-05 VITALS — BP 142/73 | HR 73 | Temp 97.8°F | Ht 65.0 in | Wt 135.0 lb

## 2024-03-05 DIAGNOSIS — Z13820 Encounter for screening for osteoporosis: Secondary | ICD-10-CM

## 2024-03-05 DIAGNOSIS — Z0000 Encounter for general adult medical examination without abnormal findings: Secondary | ICD-10-CM

## 2024-03-05 NOTE — Progress Notes (Signed)
 Chief Complaint  Patient presents with   Medicare Wellness     Subjective:   Tasha [name] is a 73 y.o. female who presents for a Medicare Annual Wellness Visit.  Allergies (verified) Hydromet [hydrocodone  bit-homatrop mbr], Ofev  [nintedanib], Toprol  xl [metoprolol ], and Amoxicillin   History: Past Medical History:  Diagnosis Date   Acute respiratory failure (HCC)    CAP (community acquired pneumonia)    Hyperlipidemia    Hypertension    Metabolic syndrome    Past Surgical History:  Procedure Laterality Date   CESAREAN SECTION     COLONOSCOPY N/A 06/07/2019   Procedure: COLONOSCOPY;  Surgeon: Harvey Margo CROME, MD;  [facility];  Service: Endoscopy;  Laterality: N/A;  12:45   POLYPECTOMY  06/07/2019   Procedure: POLYPECTOMY;  Surgeon: Harvey Margo CROME, MD;  Location: [facility];  Service: Endoscopy;;   Family History  Problem Relation Age of Onset   Cancer Father    Healthy Brother    Healthy Daughter    Healthy Sister    Memory loss Sister    Healthy Son    Healthy Son    Breast cancer Niece    Social History   Occupational History   Occupation: retired  Tobacco Use   Smoking status: Never    Passive exposure: Past   Smokeless tobacco: Never  Vaping Use   Vaping status: Never Used  Substance and Sexual Activity   Alcohol use: No   Drug use: No   Sexual activity: Not Currently    Birth control/protection: Post-menopausal   Tobacco Counseling Counseling given: Yes  SDOH Screenings   Food Insecurity: No Food Insecurity (03/05/2024)  Housing: Unknown (03/05/2024)  Transportation Needs: No Transportation Needs (03/05/2024)  Utilities: Not At Risk (03/05/2024)  Alcohol Screen: Low Risk  (02/26/2023)  Depression (PHQ2-9): Low Risk  (03/05/2024)  Financial Resource Strain: Low Risk  (02/26/2023)  Physical Activity: Insufficiently Active (03/05/2024)  Social Connections: Socially Integrated (03/05/2024)  Stress: No Stress Concern Present  (03/05/2024)  Tobacco Use: Low Risk  (03/05/2024)  Health Literacy: Inadequate Health Literacy (03/05/2024)   See flowsheets for full screening details  Depression Screen PHQ 2 & 9 Depression Scale- Over the past 2 weeks, how often have you been bothered by any of the following problems? Little interest or pleasure in doing things: 0 Feeling down, depressed, or hopeless (PHQ Adolescent also includes...irritable): 0 PHQ-2 Total Score: 0 Trouble falling or staying asleep, or sleeping too much: 0 Feeling tired or having little energy: 0 Poor appetite or overeating (PHQ Adolescent also includes...weight loss): 0 Feeling bad about yourself - or that you are a failure or have let yourself or your family down: 0 Trouble concentrating on things, such as reading the newspaper or watching television (PHQ Adolescent also includes...like school work): 0 Moving or speaking so slowly that other people could have noticed. Or the opposite - being so fidgety or restless that you have been moving around a lot more than usual: 0 Thoughts that you would be better off dead, or of hurting yourself in some way: 0 PHQ-9 Total Score: 0 If you checked off any problems, how difficult have these problems made it for you to do your work, take care of things at home, or get along with other people?: Not difficult at all  Depression Treatment Depression Interventions/Treatment : EYV7-0 Score <4 Follow-up Not Indicated     Goals Addressed   None    Visit info / Clinical Intake: Medicare Wellness Visit  Type:: Subsequent Annual Wellness Visit Persons participating in visit:: patient Medicare Wellness Visit Mode:: Telephone If Telephone or Video please confirm:: I connected with the patient using audio enabled telemedicine application and verified that I am speaking with the correct person using two identifiers Patient Location:: office/in person Provider Location:: office Information given by::  patient Interpreter Needed?: Yes Interpreter Name: Emile #[id number] Patient Declined Interpreter : No Interpretation services provided by: [facility] Interpreter Patient signed [place] waiver: Yes Pre-visit prep was completed: yes AWV questionnaire completed by patient prior to visit?: no Living arrangements:: lives with spouse/significant other Patient's Overall Health Status Rating: good Typical amount of pain: none Does pain affect daily life?: no Are you currently prescribed opioids?: no  Dietary Habits and Nutritional Risks How many meals a day?: 3 Eats fruit and vegetables daily?: yes Most meals are obtained by: preparing own meals In the last 2 weeks, have you had any of the following?: none Diabetic:: no  Functional Status Activities of Daily Living (to include ambulation/medication): Independent Ambulation: Independent Medication Administration: Independent Home Management: Independent Concerns about hearing?: no  Fall Screening Falls in the past year?: 0 Number of falls in past year: 0 Was there an injury with Fall?: 0 Fall Risk Category Calculator: 0 Patient Fall Risk Level: Low Fall Risk  Fall Risk Patient at Risk for Falls Due to: No Fall Risks Fall risk Follow up: Falls evaluation completed; Education provided  Home and Transportation Safety: All rugs have non-skid backing?: yes All stairs or steps have railings?: yes Grab bars in the bathtub or shower?: yes Have non-skid surface in bathtub or shower?: yes Good home lighting?: yes Regular seat belt use?: yes Hospital stays in the last year:: no  Cognitive Assessment Difficulty concentrating, remembering, or making decisions? : no Will 6CIT or Mini Cog be Completed: yes What year is it?: 0 points What month is it?: 0 points Give patient an address phrase to remember (5 components): [address] [place], [place] About what time is it?: 0 points Count backwards from 20 to 1: 0 points Say the months of the  year in reverse: 0 points Repeat the address phrase from earlier: 0 points 6 CIT Score: 0 points  Advance Directives (For Healthcare) Does Patient Have a Medical Advance Directive?: No Would patient like information on creating a medical advance directive?: No - Patient declined  Reviewed/Updated  Reviewed/Updated: Reviewed All (Medical, Surgical, Family, Medications, Allergies, Care Teams, Patient Goals); Medical History; Surgical History; Family History; Medications; Allergies; Care Teams; Patient Goals        Objective:    Today's Vitals   03/05/24 0957  BP: (!) 142/73  Pulse: 73  Temp: 97.8 F (36.6 C)  TempSrc: Temporal  Weight: 135 lb (61.2 kg)  Height: 5' 5 (1.651 m)   Body mass index is 22.47 kg/m.  Current Medications (verified) Outpatient Encounter Medications as of 03/05/2024  Medication Sig   acetaminophen  (TYLENOL ) 500 MG tablet Take 1 tablet (500 mg total) by mouth every 6 (six) hours as needed.   apixaban  (ELIQUIS ) 5 MG TABS tablet Take 1 tablet by mouth twice daily   atorvastatin  (LIPITOR) 20 MG tablet Take 1 tablet (20 mg total) by mouth daily.   cetirizine  (ZYRTEC ) 10 MG tablet Take 1 tablet (10 mg total) by mouth daily as needed for allergies.   diltiazem  (CARDIZEM  CD) 120 MG 24 hr capsule Take 1 capsule (120 mg total) by mouth daily.   fluticasone  (FLOVENT  HFA) 110 MCG/ACT inhaler Inhale 1  puff into the lungs 2 (two) times daily.   hydrochlorothiazide  (HYDRODIURIL ) 25 MG tablet Take 1 tablet (25 mg total) by mouth daily.   ibuprofen (ADVIL) 200 MG tablet Take 400 mg by mouth every 6 (six) hours as needed for headache or moderate pain.   ipratropium-albuterol  (DUONEB) 0.5-2.5 (3) MG/3ML SOLN Take 3 mLs by nebulization every 6 (six) hours as needed.   levothyroxine  (SYNTHROID ) 50 MCG tablet Take 1 tablet (50 mcg total) by mouth daily before breakfast.   losartan  (COZAAR ) 50 MG tablet Take 1 tablet (50 mg total) by mouth daily.   ondansetron  (ZOFRAN -ODT) 8  MG disintegrating tablet Take 1 tablet (8 mg total) by mouth every 6 (six) hours as needed for nausea or vomiting.   pantoprazole  (PROTONIX ) 40 MG tablet Take 1 tablet (40 mg total) by mouth daily.   Pirfenidone  267 MG TABS Take 2 tablets (534 mg total) by mouth 3 (three) times daily with meals.   potassium chloride  SA (KLOR-CON  M) 20 MEQ tablet Take 1 tablet (20 mEq total) by mouth daily.   No facility-administered encounter medications on file as of 03/05/2024.   Hearing/Vision screen Hearing Screening - Comments:: Pt denies hearing dif Vision Screening - Comments:: Pt wear glasses/pt goes [facility] in [place], [place]/last ov 06/2022 Immunizations and Health Maintenance Health Maintenance  Topic Date Due   DEXA SCAN  03/24/2022   Mammogram  06/01/2024   Medicare Annual Wellness (AWV)  03/05/2025   Colonoscopy  06/06/2029   DTaP/Tdap/Td (2 - Td or Tdap) 01/01/2032   Pneumococcal Vaccine: 50+ Years  Completed   Influenza Vaccine  Completed   Hepatitis C Screening  Completed   Zoster Vaccines- Shingrix  Completed   Meningococcal B Vaccine  Aged Out   COLON CANCER SCREENING ANNUAL FOBT  Discontinued   COVID-19 Vaccine  Discontinued        Assessment/Plan:  This is a routine wellness examination for Tasha [name].  Patient Care Team: Dettinger, Fonda LABOR, MD as PCP - General (Family Medicine) Mallipeddi, Diannah SQUIBB, MD as PCP - Cardiology (Cardiology) Shaaron Lamar HERO, MD as Consulting Physician (Gastroenterology) Geronimo Amel, MD as Consulting Physician (Pulmonary Disease)  I have personally reviewed and noted the following in the patient's chart:   Medical and social history Use of alcohol, tobacco or illicit drugs  Current medications and supplements including opioid prescriptions. Functional ability and status Nutritional status Physical activity Advanced directives List of other physicians Hospitalizations, surgeries, and ER visits in previous 12 months Vitals Screenings to  include cognitive, depression, and falls Referrals and appointments  No orders of the defined types were placed in this encounter.  In addition, I have reviewed and discussed with patient certain preventive protocols, quality metrics, and best practice recommendations. A written personalized care plan for preventive services as well as general preventive health recommendations were provided to patient.   Tasha [name], CMA   03/05/2024   Return in 1 year (on 03/05/2025).  After Visit Summary: (MyChart) Due to this being a telephonic visit, the after visit summary with patients personalized plan was offered to patient via MyChart   Nurse Notes: pt is aware and due the following: Dexa Scan

## 2024-03-19 ENCOUNTER — Other Ambulatory Visit (HOSPITAL_COMMUNITY): Payer: Self-pay

## 2024-03-23 ENCOUNTER — Other Ambulatory Visit: Payer: Self-pay

## 2024-03-23 ENCOUNTER — Other Ambulatory Visit (HOSPITAL_COMMUNITY): Payer: Self-pay

## 2024-03-23 NOTE — Progress Notes (Signed)
 Specialty Pharmacy Refill Coordination Note  Tasha [name] is a 73 y.o. female contacted today regarding refills of specialty medication(s) Pirfenidone    Patient requested Delivery   Delivery date: 03/25/24   Verified address: [address]  [place] [address]   Medication will be filled on: 03/24/24

## 2024-03-25 ENCOUNTER — Ambulatory Visit: Admitting: Internal Medicine

## 2024-03-25 ENCOUNTER — Ambulatory Visit: Admitting: *Deleted

## 2024-03-25 ENCOUNTER — Ambulatory Visit: Payer: Self-pay | Admitting: Internal Medicine

## 2024-03-25 ENCOUNTER — Encounter: Payer: Self-pay | Admitting: Internal Medicine

## 2024-03-25 VITALS — BP 124/70 | HR 91 | Temp 98.3°F | Ht 60.5 in | Wt 134.0 lb

## 2024-03-25 DIAGNOSIS — J849 Interstitial pulmonary disease, unspecified: Secondary | ICD-10-CM

## 2024-03-25 DIAGNOSIS — J8489 Other specified interstitial pulmonary diseases: Secondary | ICD-10-CM

## 2024-03-25 DIAGNOSIS — Z5181 Encounter for therapeutic drug level monitoring: Secondary | ICD-10-CM

## 2024-03-25 LAB — PULMONARY FUNCTION TEST
DL/VA % pred: 95 %
DL/VA: 4.02 ml/min/mmHg/L
DLCO cor % pred: 71 %
DLCO cor: 12.12 ml/min/mmHg
DLCO unc % pred: 71 %
DLCO unc: 12.12 ml/min/mmHg
FEF 25-75 Pre: 2.77 L/s
FEF2575-%Pred-Pre: 177 %
FEV1-%Pred-Pre: 99 %
FEV1-Pre: 1.85 L
FEV1FVC-%Pred-Pre: 117 %
FEV6-%Pred-Pre: 89 %
FEV6-Pre: 2.09 L
FEV6FVC-%Pred-Pre: 105 %
FVC-%Pred-Pre: 84 %
FVC-Pre: 2.09 L
Pre FEV1/FVC ratio: 88 %
Pre FEV6/FVC Ratio: 100 %

## 2024-03-25 LAB — HEPATIC FUNCTION PANEL
ALT: 20 U/L (ref 0–35)
AST: 25 U/L (ref 0–37)
Albumin: 4.5 g/dL (ref 3.5–5.2)
Alkaline Phosphatase: 53 U/L (ref 39–117)
Bilirubin, Direct: 0.1 mg/dL (ref 0.0–0.3)
Total Bilirubin: 0.3 mg/dL (ref 0.2–1.2)
Total Protein: 7.1 g/dL (ref 6.0–8.3)

## 2024-03-25 NOTE — Patient Instructions (Signed)
 Spirometry and diffusion capacity performed today.

## 2024-03-25 NOTE — Progress Notes (Signed)
 OV 01/22/18 73 year old female, never smoked. PMH  Hypertension, metabolic syndrome, thrombocytopenia, prediabetes, interstitial lung disease, PAF. Not previously followed by [facility]. Admitted to [facility] from outside hospital on 08/27 with acute hypoxic respiratory failure in setting of bilateral pulmonary infiltrates. Work up indicated BOOPD. Dx subacute ILD vs pneumonia thought to be from exposure to antifreeze leak in patients car. Requiring intubation from 08/30-09/05, chest tube placed due to pneumothorax. [facility] from 09/10- 09/20, required min assistance with mobility and basic self-care tasks. Speech therapy worked on dysphagia, able to tolerate regular diet thin liquids without signs and symptoms of aspiration.    Patient presents today for hospital follow-up visit. Accompanied by her daughter who also provides translation. She is feeling a lot better, no breathing issues. Finished oral steroid today. BS 119. CBC improving. Eating and drinking ok. No difficulties swallowing and denies aspiration. Ambulting with walker. Denies fall, sob, wheezing, cough, fever or chest pain.    Results for TATIONNA, FULLARD (MRN [id number]) as of 02/19/2018 10:01  Ref. Range 12/16/2017 19:24 12/17/2017 18:59  Sed Rate Latest Ref Range: 0 - 22 mm/hr 76 (H) 69 (H)  Results for ASMAA, TIRPAK (MRN [id number]) as of 02/19/2018 10:01  Ref. Range 12/17/2017 18:59  Speckled Pattern Unknown 1:320 (H)  Results for LARON, ANGELINI (MRN [id number]) as of 02/19/2018 10:01  Ref. Range 12/19/2017 14:15  Color, Fluid Latest Ref Range: YELLOW  COLORLESS (A)  WBC, Fluid Latest Ref Range: 0 - 1,000 cu mm 93  Lymphs, Fluid Latest Units: % 78  Eos, Fluid Latest Units: % 2  Appearance, Fluid Latest Ref Range: CLEAR  CLEAR (A)  Neutrophil Count, Fluid Latest Ref Range: 0 - 25 % 7  Monocyte-Macrophage-Serous Fluid Latest Ref Range: 50 - 90 % 13 (L)     OV  02/19/2018  Subjective:  [name], female , DOB: [birth year] , age 1 y.o. , MRN: [id number] , ADDRESS: [address] [place] [address]   02/19/2018 -   Chief Complaint  Patient presents with   Follow-up    States she still has dry cough, states the SOB has resolved. Denies chest pain or idscomfort.      HPI Tasha [name] 73 y.o. -accompanied by her daughter Tasha [name].  End of August 2019 she suffered acute lung injury possibly Boop based on the nature of infiltrates and a high ESR and a positive ANA [BAL lymphocytosis].  There is some history of antifreeze exposure.  She has recovered from it and is now at home.  She is brought in by Dr. Hadassah.  Tasha [name] is doing the translation.  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  Post discharge in the hospital patient is off oxygen and is overall better.  Steroids ended 2 weeks ago but since the completion of steroids she feels the cough is coming back and getting worse.  Currently cough is moderate in intensity and without any sputum production.  2 days ago her primary care physician apparently changed 1 of her antihypertensives because of the cough.  I am unable to determine which one but I suspect it was an ACE inhibitor.  She is currently on losartan .  There is no fever or chills.  Current walking desaturation test appears adequate        ROS  OV 03/31/2018  Subjective:  [name], female , DOB: 1950/09/24 , age 32  y.o. , MRN: [id number] , ADDRESS: [address] [place] [address]   03/31/2018 -   Chief Complaint  Patient presents with   Follow-up    pt states breathing is doing well. c/o non prod cough.     HPI Tasha [name] 73 y.o. -presents for interstitial lung disease acute with lymphocytic bronchoalveolar lavage after antifreeze exposure  After last visit she showed improvement.  We repeated ANA and this was negative.  We also did some extra  antibody such as anti-Jo 1 and this was negative.  Her ESR has normalized.  In the interim she continues to improve.  Shortness of breath is almost nonexistent.  Cough is improved but she still has mild residual cough.  She had high-resolution CT scan of the chest #2019 that I personally visualized and there is still some residual groundglass opacities along with some evolving fibrotic pattern.  It seems more predominant in the lung base.  Her her son Tasha [name] is with her.  History is gained from talking to interpreter on the video camera.  His name is Tasha [name].  She denies any diabetes or hyperlipidemia or stroke in the past.  She does not have any mood swings.  In the past she has tolerated prednisone  without problem.  Denies any bony issues.   OV 06/11/2018  Subjective:  [name], female , DOB: 1951-02-26 , age 61 y.o. , MRN: [id number] , ADDRESS: [address] [place] [address]  Tasha [name] 73 y.o. -presents for interstitial lung disease acute with lymphocytic bronchoalveolar lavage after antifreeze exposure 06/11/2018 -   Chief Complaint  Patient presents with   Follow-up    PFT performed today.  Pt states she has been doing well since last visit and denies any complaints.      HPI Tasha [name] 73 y.o. -follow-up.  At last visit in December 2019 I placed her on a 10-week prednisone  course.  In between she came and saw a nurse practitioner approximately just over a month ago.  She was tolerating the prednisone  fine.  It is now 4 weeks and she finished the prednisone .  According to the daughter who acted as the interpreter the prednisone  really did help with her symptoms.  It improved her shortness of breath.  Currently she is nearly asymptomatic.  Her current symptoms are documented below.  She is feeling good.  She had pulmonary function test today with spirometry being normal and DLCO being near normal.  She is noted to be on ACE inhibitor but she denies any cough.  Last CT  scan of the chest was November 2019    OV 12/07/2019   Subjective:  [name], female , DOB: Sep 09, 1950, age 84 y.o. years. , MRN: [id number],  ADDRESS: [address] [place] [address] PCP  Dettinger, Fonda LABOR, MD Providers : Treatment Team:  Attending Provider: Geronimo Amel, MD presents for interstitial lung disease acute with lymphocytic bronchoalveolar lavage after antifreeze exposure in aug 2019  Chief Complaint  Patient presents with   Follow-up    Last seen 06/11/2018. Pt states she has been about the same since last visit. Pt is still having complaints of cough and is coughing up occ white phlegm. Pt also has increased SOB when she is in enclosed areas that makes her feel like she cannot breathe.       HPI Tasha [name] 73 y.o. -presents with her daughter-in-law is for a face-to-face visit.  The interpreter is Tasha [name].  Tasha [name] is from  [place].  It appears that overall since her last visit in February 2020 patient is stable.  In August 2020 she had a CT scan of the chest that shows presence of mild ongoing ILD.  She tells us  that she is had the Covid vaccine.  She has been exercising at the [facility].  She does not use oxygen.  She has mild shortness of breath with exertion when she bends down and some mild cough but overall compared to 2019 she is slowly progressively better.  She does have some dry cough when she is sitting as well.  She needs to drink water  to improve that.  There are no other new medical issues.  Her walking desaturation test is unchanged compared to February 2020.   however when she did the interstitial lung disease symptom questionnaire symptoms are significantly worse than before.  She really cannot pinpoint if she is definitely worse or not.  She did admit that she has significant environmental allergies to pollen dust and perfumes.  This is a new history.   CT chest high resolution Aug 2020 COMPARISON:  03/11/2018, 12/17/2017    FINDINGS: Cardiovascular: Aortic atherosclerosis. Normal heart size. Scattered coronary artery calcifications. No pericardial effusion.   Mediastinum/Nodes: No enlarged mediastinal, hilar, or axillary lymph nodes. Thyroid  gland, trachea, and esophagus demonstrate no significant findings.   Lungs/Pleura: There is a mild pattern of pulmonary fibrosis with an apical to basal gradient featuring mild tubular bronchiectasis, irregular peripheral interstitial opacity with some superimposed ground-glass, and some evidence of bronchiolectasis in the lung bases. There is no overt honeycombing. No significant air trapping on expiratory phase imaging. There is a densely calcified benign pulmonary nodule of the left lung base. No pleural effusion or pneumothorax.  IMPRESSION: 1. There is a mild pattern of pulmonary fibrosis with an apical to basal gradient featuring mild tubular bronchiectasis, irregular peripheral interstitial opacity with some superimposed ground-glass, and some evidence of bronchiolectasis in the lung bases. There is no overt honeycombing. No significant air trapping on expiratory phase imaging. Findings are not significantly changed compared to prior examination and consistent with an indeterminate for UIP pattern fibrosis by ATS pulmonary fibrosis criteria.   2.  Aortic atherosclerosis and coronary artery disease.     Electronically Signed   By: Marolyn Jaksch M.D.   On: 12/10/2018 12:13  Results for KATIRA, DUMAIS (MRN [id number]) as of 06/11/2018 12:13  Ref. Range 06/11/2018 10:31  FVC-Pre Latest Units: L 2.05  FVC-%Pred-Pre Latest Units: % 74  FEV1-Pre Latest Units: L 1.88  FEV1-%Pred-Pre Latest Units: % 90  Pre FEV1/FVC ratio Latest Units: % 92   Results for SADAKO, CEGIELSKI (MRN [id number]) as of 06/11/2018 12:13  Ref. Range 06/11/2018 10:31  DLCO unc Latest Units: ml/min/mmHg 13.22  DLCO unc % pred Latest Units: % 74  Results for Dass, Annaleigha (MRN  [id number]) as of 03/31/2018 16:37  Ref. Range 12/16/2017 19:24 12/17/2017 18:59 02/19/2018 10:53  Sed Rate Latest Ref Range: 0 - 30 mm/hr 76 (H) 69 (H) 18  Results for PHOUA, HOADLEY (MRN [id number]) as of 03/31/2018 16:37  Ref. Range 02/19/2018 10:53 02/19/2018 11:03  Anti Nuclear Antibody(ANA) Latest Ref Range: Negative   Negative  Anti JO-1 Latest Ref Range: 0.0 - 0.9 AI  <0.2  ds DNA Ab Latest Units: IU/mL <1   Results for GENIA, PERIN (MRN [id number]) as of 01/25/2020 16:46  Ref. Range 11/25/2018 09:57 01/25/2019 09:31  Anti Nuclear Antibody (ANA) Latest Ref Range: NEGATIVE   POSITIVE (A)  ANA  Pattern 1 Unknown  Nuclear, Nucleolar (A)  ANA Titer 1 Latest Units: titer Positive (A) 1:640 (H)  Cyclic Citrullin Peptide Ab Latest Units: UNITS 3 <16  ds DNA Ab Latest Units: IU/mL  <1  RA Latex Turbid. Latest Ref Range: <14 IU/mL 10.9 <14  ENA SM Ab Ser-aCnc Latest Ref Range: <1.0 NEG AI  <1.0 NEG  Homogeneous Pattern Unknown 1:160 (H)   Speckled Pattern Unknown 1:320 (H)       OV 01/25/2020   Subjective:  [name], female , DOB: [birth year], age 57 y.o. years. , MRN: [id number],  ADDRESS: [address] [place] [address] PCP  Dettinger, Fonda LABOR, MD Providers : Treatment Team:  Attending Provider: Geronimo Amel, MD   Chief Complaint  Patient presents with   Follow-up    cough decreased     August 2019 she suffered acute lung injury possibly Boop based on the nature of infiltrates and a high ESR and a positive ANA [BAL lymphocytosis].  There is some history of antifreeze exposure. -> ILD  Hx of env allergies - blood allergy test Aug 2021 - igE normal / but positive for Grass  Intermittent ANA positive - last +ve late 2020   HPI Tasha [name] 73 y.o. -presents with her daughter in law Tasha [name] and also the interpreter Tasha [name].  This visit has been scheduled because last visit she had increased symptoms.  Therefore we did a high-resolution CT chest Dr.  Conley the radiologist thinks the slight progression.  However we also repeated pulmonary function test improvement/stabilization.  In terms of symptoms patient tells me her symptoms are actually improved.  ILD symptom score actually shows improvement.  She attributes this to exercise.  We did walking desaturation test and it is stable.  Glucotrol documented below.  He understands that she has pulmonary fibrosis.  In fact she says that she looked at [employer] and got extremely worried about pulmonary fibrosis.  At this point in time she denies any symptoms of connective tissue disease.  Overall she is stable.  She continues to have cough.  We did blood allergy work-up and is positive for grass mold IgE is normal.  She was supposed to exam nitric oxide test but this has not been done.  She has had a Covid vaccine and flu shot.  She had to have a booster counseled her about that.   No results found for: NITRICOXIDE     IMPRESSION: 1. The appearance of the lungs is compatible with interstitial lung disease, with mild progression of changes compared to the prior study, with a spectrum of findings considered probable usual interstitial pneumonia (UIP) per current ATS guidelines. 2. Aortic atherosclerosis, in addition to left main and 2 vessel coronary artery disease. Please note that although the presence of coronary artery calcium  documents the presence of coronary artery disease, the severity of this disease and any potential stenosis cannot be assessed on this non-gated CT examination. Assessment for potential risk factor modification, dietary therapy or pharmacologic therapy may be warranted, if clinically indicated.   Aortic Atherosclerosis (ICD10-I70.0).     Electronically Signed   By: Tasha [name] M.D.   On: 12/31/2019 08:22 OV 10/26/2020  Subjective:  [name], female , DOB: 10-14-1950 , age 54 y.o. , MRN: [id number] , ADDRESS: [address] [place]  [address] PCP Dettinger, Fonda LABOR, MD Patient Care Team: Dettinger, Fonda LABOR, MD as PCP - General (Family Medicine) Charls Pearla LABOR, MD (Inactive) as PCP -  Cardiology (Cardiology) Tasha Lamar HERO, MD as Consulting Physician (Gastroenterology)  This Provider for this visit: Treatment Team:  Attending Provider: Geronimo Amel, MD    10/26/2020 -   Chief Complaint  Patient presents with   Follow-up    Pt states she is feeling better since last visit. States that she does have some SOB but after using her inhaler twice a day she feels fine.    August 2019 she suffered acute lung injury possibly Boop based on the nature of infiltrates and a high ESR and a positive ANA [BAL lymphocytosis].  There is some history of antifreeze exposure. -> ILD  Hx of env allergies - blood allergy test Aug 2021 - igE normal / but positive for Grass  Intermittent ANA positive - last +ve late 2020   HPI Tasha [name] 73 y.o. -returns for follow-up with her daughter-in-law Tasha [name].  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  She feels the Pulmicort  is helping her.  In fact symptoms improved after Pulmicort .  There are no interim health issues.  Symptom score is stable.  Walking desaturation test is stable.  Previously we checked ANA and is again positive the entire ANA profile as listed.  On exam she still has bilateral bibasal crackles      OV 04/27/2021  Subjective:  [name], female , DOB: [birth year] , age 23 y.o. , MRN: [id number] , ADDRESS: [address] [place] [address] PCP Dettinger, Fonda LABOR, MD Patient Care Team: Dettinger, Fonda LABOR, MD as PCP - General (Family Medicine) Kate Lonni CROME, MD as PCP - Cardiology (Cardiology) Tasha Lamar HERO, MD as Consulting Physician (Gastroenterology)  This Provider for this visit: Treatment Team:  Attending Provider: Geronimo Amel, MD    04/27/2021 -   Chief Complaint  Patient presents with   Follow-up     PFT performed today.  Pt states she is about the same since last visit.    August 2019 she suffered acute lung injury possibly Boop based on the nature of infiltrates and a high ESR and a positive ANA [BAL lymphocytosis].  There is some history of antifreeze exposure. -> ILD  Hx of env allergies - blood allergy test Aug 2021 - igE normal / but positive for Grass  Intermittent ANA positive - last +ve late 2020  HPI Jamia Walthall 73 y.o. -returns for follow-up.  Her daughter acts as the interpreter.  Overall she feels stable.  This can be evidence in the ILD symptom score.  Also walking desaturation test is stable.  She is interested in pulmonary rehabilitation to improve her shortness of breath that is still there somewhat.  Her pulmonary function test shows and suggest decline in 3 years.  She is not feeling this.  Last CT scan of the chest was in September 2021.  Explained the need for repeat CT scan and pulmonary function test at a closer than 40-month interval and she is willing.  Explained that if there is evidence of progression we would consider antifibrotic's and she is willing.  Meanwhile she will get referred to pulmonary rehabilitation.    .      OV 07/26/2021  Subjective:  [name], female , DOB: [birth year] , age 58 y.o. , MRN: [id number] , ADDRESS: [address] [place] [address] PCP Dettinger, Fonda LABOR, MD Patient Care Team: Dettinger, Fonda LABOR, MD as PCP - General (Family Medicine) Kate Lonni CROME, MD as PCP - Cardiology (Cardiology) Tasha Lamar HERO, MD as Consulting  Physician (Gastroenterology)  This Provider for this visit: Treatment Team:  Attending Provider: Geronimo Amel, MD   07/26/2021 -   Chief Complaint  Patient presents with   Follow-up    PFT performed today.  Pt states she has been doing okay since last visit and denies any complaints.     HPI Alleah Ezzell 73 y.o. -ILD undifferentiated phenotype.  Presents for  follow-up.  Last visit was in January 2023.  There was some concern for progression so ordered high-resolution CT chest and pulmonary function testing.  For unclear reason she missed a high-resolution CT chest.  She does not remember she had an appointment.  CMA tells me that she missed appointment.  She did have pulmonary function testing.  It shows stability compared to January 2023 but possible decline compared to 2 years ago or 3 years ago.  Symptom wise she is going to pulmonary rehabilitation she says she is feeling better but the symptom score below is inaccurate.  This time it was filled by interpreter.  Previously has been filled by family.  So do not know which of the numbers are accurate.  Did indicate to her that there is recent stability in her fibrosis.  Did indicate to her that the goal is continued stability.  Did indicate to her maybe in the last few years she is slightly worse.  Did indicate to her that if there is progressive phenotype then she would need antifibrotic's.  Did indicate to her that given current stability and feeling better with rehabilitatin and without evidence of CT scan we will hold off on any antifibrotic initiation for today    OV 02/07/2022  Subjective:  [name], female , DOB: 06/24/1950 , age 7 y.o. , MRN: [id number] , ADDRESS: [address] [place] [address] PCP Dettinger, Fonda LABOR, MD Patient Care Team: Dettinger, Fonda LABOR, MD as PCP - General (Family Medicine) Kate Lonni CROME, MD as PCP - Cardiology (Cardiology) Tasha Lamar HERO, MD as Consulting Physician (Gastroenterology) Geronimo Amel, MD as Consulting Physician (Pulmonary Disease)  This Provider for this visit: Treatment Team:  Attending Provider: Geronimo Amel, MD    02/07/2022 -   Chief Complaint  Patient presents with   Follow-up    Review PFT and CT.  No sx noted today.  Would like flu vaccine today    HPI Masa Mcculley 74 y.o. -returns for  follow-up.  Presents with her daughter-in-law Tasha [name].  Interpreter is Tasha [name] Kingsley from [employer].  She tells me that she is stable.  [facility] rehabilitation really worked well.  Symptom score shows stability she had pulmonary function test is also shows stability.  However she had high-resolution CT chest and I personally visualized this and compared it and showed it to her and her family.  I agree with the radiologist that significant worsening.  Therefore she has progressive phenotype.  The pattern is looking like probable UIP working towards definite of UIP which would be IPF but everything started after inhalation lung injury.  The scan shows coronary artery calcification.  No prior stress test.   She denies any GI issues of bleeding diathesis.  CT scan    OV 12/26/2022  Subjective:  [name], female , DOB: 02-23-1951 , age 3 y.o. , MRN: [id number] , ADDRESS: [address] [place] [address] PCP Dettinger, Fonda LABOR, MD Patient Care Team: Dettinger, Fonda LABOR, MD as PCP - General (Family Medicine) Mallipeddi, Diannah SQUIBB, MD as PCP - Cardiology (  Cardiology) Tasha, Lamar HERO, MD as Consulting Physician (Gastroenterology) Geronimo Amel, MD as Consulting Physician (Pulmonary Disease)  This Provider for this visit: Treatment Team:  Attending Provider: Geronimo Amel, MD  04/19/2022: Tasha [name] with [facility] NP for follow-up.  She actually came to see me around 4 weeks ago but she had yet to start on Ofev  therapy and given her stability, we postpone her appointment.  Since then, she has started on her antifibrotic's; believes the date was around 11/30.  She has been tolerating these well.  Has not noticed any significant side effects.  Overall, she feels like her breathing is stable.  She actually feels like it has been a little bit better over the last few months.  She feels like she has been trying to work on her exercise and activity status.  She was able to go  shopping over Christmas and did not have any difficulty walking through the stores.  Her cough is very minimal, almost nonexistent.  She denies any wheezing, chest congestion, lower extremity swelling, orthopnea.  She is using budesonide  inhaler twice daily.  Is not sure if this is made much of a difference for her.  She would like to see if she does okay using it less often.  Never uses her nebulizer treatments       06/14/2022: Today - follow up Patient presents today for follow up. She had PFTs today. Spirometry was stable. DLCO was declined when compared to 4 months ago but improved compared to a year ago. She feels stable to how she was last time she was here. She doesn't feel like she has much trouble with her breathing. She's able to go shopping now without any troubles. She has a rare, dry cough. She has been tolerating Ofev  well. No GI side effects. She is curious if she has any food limitations on this. Otherwise, no concerns or complaints today.    12/26/2022 -   Chief Complaint  Patient presents with   Follow-up    PFT 12/26/22 SOB     August 2019 she suffered acute lung injury possibly Boop based on the nature of infiltrates and a high ESR and a positive ANA [BAL lymphocytosis].  There is some history of antifreeze exposure. -> ILD  Hx of env allergies - blood allergy test Aug 2021 - igE normal / but positive for Grass  Intermittent ANA positive - last +ve late 2020  Coronary artery calcification follows up with cardiology in any pain  HPI Tasha [name] 73 y.o. -presents with her son.  Interpreter had to leave because it was running 15-30 minutes late.  Son did the translation.  She called in approximately a month ago with significant amount of weight loss diarrhea nausea and also chest chest tightness.  We had her stop the nintedanib.  She is off it now she is gained her weight back no diarrhea no nausea.  Regarding this is an allergy.  In terms of her respiratory status she says  she is stable.  Pulmonary function test shows stability.  This only had a CT scan that showed decline back in the fall 2023.  No interim hospitalizations or ER visits or surgeries.  Symptom score appears stable.  Sit/stand test did not show any exertional desaturations.   CT Chest data from date: *[facility] 01/25/22  - personally visualized and independently interpreted : no - my findings are: per official report   IMPRESSION: 1. Progressive interstitial lung disease with imaging findings once again categorized as probable  usual interstitial pneumonia (UIP), as above. 2. Aortic atherosclerosis, in addition to left main and three-vessel coronary artery disease. Assessment for potential risk factor modification, dietary therapy or pharmacologic therapy may be warranted, if clinically indicated. 3. Colonic diverticulosis.   Aortic Atherosclerosis (ICD10-I70.0).     Electronically Signed   By: Tasha Aye M.D.   On: 01/26/2022 12:50    OV 09/04/2023  Subjective:  [name], female , DOB: [birth year] , age 23 y.o. , MRN: [id number] , ADDRESS: [address] [place] [address] PCP Dettinger, Fonda LABOR, MD Patient Care Team: Dettinger, Fonda LABOR, MD as PCP - General (Family Medicine) Mallipeddi, Diannah SQUIBB, MD as PCP - Cardiology (Cardiology) Tasha Lamar HERO, MD as Consulting Physician (Gastroenterology) Geronimo Amel, MD as Consulting Physician (Pulmonary Disease)  This Provider for this visit: Treatment Team:  Attending Provider: Geronimo Amel, MD    09/04/2023 -   Chief Complaint  Patient presents with   Follow-up    Breathing has improved since the last visit. Has occ mild, coughing episode.      HPI Saamiya Klausner 73 y.o. -presents for follow-up.  Presents with her son.  She is Spanish only speaking.  Interpreter as a Dietitian was a formal interpreter.  Interpreter's name is [email or web address]  History is obtained from the patient through the  interpreter.  The son is not a historian.  She reports she is stable. Interim Health status: No new complaints No new medical problems. No new surgeries. No ER visits. No Urgent care visits. No changes to medications although she does say that she has an upcoming tooth extraction she is on Eliquis  for paroxysmal atrial fibrillation and wanted know about anticoagulation hold protocols.  I deferred it to cardiology but did indicate to her at least 48 hours without Eliquis  before and after surgery.  I did pointed to her that her pulmonary function fibrosis is getting worse even compared to October 2023.  Currently all the nintedanib side effects are washed out.  Even though the symptom burden is mild the disease is slowly progressive.  I visualized and show to the CT scan.  We then went on to discuss about pirfenidone /Esbriet  as a next antifibrotic option   Esbriet /Pirfenidone  requires intensive drug monitoring due to high concerns for Adverse effects of , including  Drug Induced Liver Injury, significant GI side effects that include but not limited to Diarrhea, Nausea, Vomiting,  and other system side effects that include Fatigue, headaches, weight loss and other side effects such as skin rash. These will be monitored with  blood work such as LFT initially once a month for 6 months and then quarterly   Based on the above she is willing to try this medication.   Liver function test was slightly elevated in September 2024 and this will require repeat.    CT Chest data from date: A[ril 2025  - personally visualized and independently interpreted : ues - my findings are:  AGREE   IMPRESSION: 1. Pulmonary parenchymal pattern of interstitial lung disease, as detailed above, is mildly progressive from 01/25/2022. Findings are categorized as probable UIP per consensus guidelines: Diagnosis of Idiopathic Pulmonary Fibrosis: An Official ATS/ERS/JRS/ALAT Clinical Practice Guideline. Am JINNY [employer] Crit Care  Med Vol 198, Iss 5, [phone], Dec 21 2016. 2. Aortic atherosclerosis (ICD10-I70.0). Coronary artery calcification.     Electronically Signed   By: Newell Eke M.D.   On: 08/20/2023 13:00  12/23/2023: Today - follow up Discussed the use of AI  scribe software for clinical note transcription with the patient, who gave verbal consent to proceed.  History of Present Illness Tristen Rhines is a 73 year old female with pulmonary fibrosis who presents for follow-up on her medication regimen.  She is currently taking Esbriet  (pirfenidone ) at a dose of one tablet three times a day, with doses at 8 AM, 2:30 PM, and 7:30 PM. She experienced significant side effects, including fatigue and inability to get out of bed when the dose was increased after the first seven days. She backed down to 1 tablet Three times a day, and now tolerating the medication well, with manageable fatigue and no significant gastrointestinal upset.  She experiences coughing fits primarily in cold weather and in crowded environments with strong odors such as church. Strong perfumes and cleaning products trigger her cough. She uses her inhaler infrequently, about once a month, and reports no issues during activities such as shopping or going to the gym. She overall feels well. Breathing is stable. No significant shortness of breath. No further weight loss and appetite is good.   PFT with improved FVC and otherwise stable. No recent episodes required steroids or antibiotics for her breathing.    OV 03/25/2024  Subjective:  [name], female , DOB: 1950-05-21 , age 16 y.o. , MRN: [id number] , ADDRESS: [address] [place] [address] PCP Dettinger, Fonda LABOR, MD Patient Care Team: Dettinger, Fonda LABOR, MD as PCP - General (Family Medicine) Mallipeddi, Diannah SQUIBB, MD as PCP - Cardiology (Cardiology) Tasha Lamar HERO, MD as Consulting Physician (Gastroenterology) Geronimo Amel, MD as Consulting Physician  (Pulmonary Disease)  This Provider for this visit: Treatment Team:  Attending Provider: Geronimo Amel, MD    03/25/2024 -   Chief Complaint  Patient presents with   Interstitial Lung Disease    Pt states since LOV breathing has been better due to ( Pirfenidone  and Eliquis ) When weather is cold pt has a Dry cough  Pt states she needs refill on inhaler      HPI Takelia Paulo 73 y.o. -Wells Gerdeman is a 73 year old female with pulmonary fibrosis who presents for medication management and follow-up.  She is currently on Esbriet  for her pulmonary fibrosis. Initially, she started with one pill three times a day for seven days. After increasing to two pills three times a day, she experienced some discomfort but has since adjusted to the dosage. She now takes two pills in the morning, two at midday, and two in the evening, totaling six pills a day, with no current side effects.  During her last visit in September, she received guidance on the correct way to take her medication, including taking it with food. Since then, she has been adhering to the prescribed regimen without issues.  She has received her annual flu shot, which she typically gets around September or October.  She reports no current side effects from her medication regimen.   SYMPTOM SCALE - ILD 06/11/2018   12/07/2019   01/25/2020 Last Weight  Most recent update: 01/25/2020  4:14 PM      Weight  65.1 kg (143 lb 9.6 oz)                     7/7/2022145# 04/27/2021   07/26/2021 Pulmonary rehabilitation at [facility] since February 2023  -filled by interpreter Tasha [name] 02/07/2022   04/19/2022 06/14/2022 12/26/2022 Off ofev  due to gi sidde ffects 09/04/2023 On supprotiove cre 03/25/2024 Low dose ebire  O2 use ra  ra ra ra ra ra ra ra ra ra ra ra  Shortness of Breath 0 -> 5 scale with 5 being worst (score 6 If unable to do)           0       At rest 0 0 0 0 0 2 0 0 0 0  0 0  Simple tasks - showers, clothes change, eating,  shaving 0 3 0 0 0 2 0 0 0 1  0 0  Household (dishes, doing bed, laundry) 0 3 3 03 4 3 02 0 0 1  0 0  Shopping 1 4 3  03 0 1 0 0 0 0 0 0  Walking level at own pace 0 4 0 0 0 1 0 0 0 1  2 0  Walking stairs 0 6 4 4 2 3 4 1 1 3  2 1  Total (40 - 48) Dyspnea Score 1 20 10 10 6 12 6 1 1 6 1 1   How bad is your cough? 0 5 3 4 4 2 1 1 1 1  1 3  How bad is your fatigue 3 4 4 3 3 3  0 0 0 3 0 0  nausea   0 0 0 0 0 0 0 0 0 0  0  vomit   0 0 0 0 0 0 0 0 0 0  0  diarrhea   0 0 0 0 0 0 0 0 0 0  0  anzity   3 3 3  0 00 0  0 0 1 0 0  Dep[resssio   meds 0 0 0 0 0 0 0 0 0 0           SIT STAND TEST - goal 15 times   03/25/2024    O2 used ra   PRobe - finter or forehead finger   Number sit and stand completed - goal 15 15   Time taken to complete 41 sec   Resting Pulse Ox/HR/Dyspnea  97% and 91/min and dyspnea of 0/10    Peak measures 96 % and 112/min and dyspnea of 0/10   Final Pulse Ox/HR 97% and 94/min and dyspnea of 0/10   Desaturated </= 88% no   Desaturated <= 3% points no   Got Tachycardic >/= 90/min yes   Miscellaneous comments asymtomatic      Latest Reference Range & Units 09/23/12 13:35 05/04/13 15:28 02/04/14 16:05 08/10/14 08:52 11/09/14 11:31 02/09/15 11:11 08/10/15 12:02 02/20/16 10:00 07/11/16 15:36 06/17/17 10:19 12/17/17 03:24 12/20/17 02:56 12/29/17 04:10 12/30/17 05:09 12/31/17 04:40 01/05/18 06:58 01/09/18 05:45 01/21/18 12:48 03/18/18 14:37 11/25/18 09:57 12/09/18 14:42 01/13/19 16:56 04/14/19 09:13 10/18/19 11:09 03/23/20 10:42 09/06/20 14:29 03/23/21 09:53 09/21/21 10:56 12/27/21 13:05 02/07/22 15:31 04/19/22 10:35 05/21/22 09:00 06/14/22 10:59 12/26/22 16:30 09/05/23 10:16 10/06/23 11:50 12/23/23 09:50 01/22/24 10:22 02/24/24 11:52  AST 0 - 40 IU/L 23 21 20 17 20 21 22 18 22 20 30 19 24 31  36 32 25 17 33 32 28 33 30 41  (H) 36 46 (H) 75 (H) 73 (H) 65 (H) 68 (H) 46 (H) 48 (H) 54 (H) 50 (H) 52 (H) 48 (H) 32 31 29  ALT 0 - 32 IU/L 21 19 17 12 14 16 22 16 18 19 17 21  37 57 (H) 68 (H) 74  (H) 51 (H) 26 38 (H) 30 26 36 (H) 30 43 (H) 44 (H) 56 (H) 97 (H) 95 (H) 89 (H) 91 (H) 57 (H) 55 (H) 65 (H) 52 (H) 53 (H) 53 (H)  28 24 20   (H): Data is abnormally high   PFT     Latest Ref Rng & Units 03/25/2024    1:52 PM 09/05/2023    9:57 AM 12/26/2022    2:52 PM 06/14/2022    9:49 AM 02/07/2022    1:39 PM 07/26/2021    9:08 AM 04/27/2021   10:21 AM  PFT Results  FVC-Pre L 2.09  P 2.16  2.18  2.12  2.04  2.05  1.98   FVC-Predicted Pre % 84  P 87  86  83  78  78  75   Pre FEV1/FVC % % 88  P 85  50  90  92  91  89   FEV1-Pre L 1.85  P 1.83  1.08  1.91  1.88  1.86  1.77   FEV1-Predicted Pre % 99  P 98  57  99  95  94  88   DLCO uncorrected ml/min/mmHg 12.12  P 11.88  12.58  11.51  14.38  11.06  10.43   DLCO UNC% % 71  P 69  73  67  82  63  59   DLCO corrected ml/min/mmHg 12.12  P 11.88  12.58  11.51  14.38  11.06  10.28   DLCO COR %Predicted % 71  P 69  73  67  82  63  58   DLVA Predicted % 95  P 92  95  84  114  83  80     P Preliminary result       LAB RESULTS last 96 hours No results found.       has a past medical history of Acute respiratory failure (HCC), CAP (community acquired pneumonia), Hyperlipidemia, Hypertension, and Metabolic syndrome.   reports that she has never smoked. She has been exposed to tobacco smoke. She has never used smokeless tobacco.  Past Surgical History:  Procedure Laterality Date   CESAREAN SECTION     COLONOSCOPY N/A 06/07/2019   Procedure: COLONOSCOPY;  Surgeon: Harvey Margo CROME, MD;  Location[facility];  Service: Endoscopy;  Laterality: N/A;  12:45   POLYPECTOMY  06/07/2019   Procedure: POLYPECTOMY;  Surgeon: Harvey Margo CROME, MD;  Location: [facility];  Service: Endoscopy;;    Allergies  Allergen Reactions   Hydromet [Hydrocodone  Bit-Homatrop Mbr]     Pt doesn't remember   Ofev  [Nintedanib] Diarrhea   Toprol  Xl [Metoprolol ] Hives    Facial rash, itching, hives   Amoxicillin Rash    Did it involve swelling of the  face/tongue/throat, SOB, or low BP? No Did it involve sudden or severe rash/hives, skin peeling, or any reaction on the inside of your mouth or nose? Unknown Did you need to seek medical attention at a hospital or doctor's office? Unknown When did it last happen?      10 + years If all above answers are "NO", may proceed with cephalosporin use.     Immunization History  Administered Date(s) Administered   Fluad Quad(high Dose 65+) 03/01/2019, 01/14/2020, 01/16/2021, 02/07/2022   Fluad Trivalent(High Dose 65+) 01/01/2023   INFLUENZA, HIGH DOSE SEASONAL PF 03/11/2017, 03/09/2018, 01/12/2024   Influenza,inj,Quad PF,6+ Mos 02/04/2014, 02/09/2015, 02/20/2016   Moderna Sars-Covid-2 Vaccination 05/21/2019, 06/18/2019   Pneumococcal Conjugate-13 08/10/2015   Pneumococcal Polysaccharide-23 06/17/2017, 12/21/2017   Tdap 12/31/2021   Zoster Recombinant(Shingrix) 09/21/2020, 06/21/2021    Family History  Problem Relation Age of Onset   Cancer Father    Healthy Brother    Healthy  Daughter    Healthy Sister    Memory loss Sister    Healthy Son    Healthy Son    Breast cancer Niece      Current Outpatient Medications:    acetaminophen  (TYLENOL ) 500 MG tablet, Take 1 tablet (500 mg total) by mouth every 6 (six) hours as needed., Disp: 30 tablet, Rfl: 0   apixaban  (ELIQUIS ) 5 MG TABS tablet, Take 1 tablet by mouth twice daily, Disp: 180 tablet, Rfl: 1   atorvastatin  (LIPITOR) 20 MG tablet, Take 1 tablet (20 mg total) by mouth daily., Disp: 90 tablet, Rfl: 3   cetirizine  (ZYRTEC ) 10 MG tablet, Take 1 tablet (10 mg total) by mouth daily as needed for allergies., Disp: 90 tablet, Rfl: 3   diltiazem  (CARDIZEM  CD) 120 MG 24 hr capsule, Take 1 capsule (120 mg total) by mouth daily., Disp: 90 capsule, Rfl: 3   fluticasone  (FLOVENT  HFA) 110 MCG/ACT inhaler, Inhale 1 puff into the lungs 2 (two) times daily., Disp: 1 each, Rfl: 12   hydrochlorothiazide  (HYDRODIURIL ) 25 MG tablet, Take 1 tablet (25 mg  total) by mouth daily., Disp: 90 tablet, Rfl: 3   ibuprofen (ADVIL) 200 MG tablet, Take 400 mg by mouth every 6 (six) hours as needed for headache or moderate pain., Disp: , Rfl:    ipratropium-albuterol  (DUONEB) 0.5-2.5 (3) MG/3ML SOLN, Take 3 mLs by nebulization every 6 (six) hours as needed., Disp: , Rfl:    levothyroxine  (SYNTHROID ) 50 MCG tablet, Take 1 tablet (50 mcg total) by mouth daily before breakfast., Disp: 90 tablet, Rfl: 3   losartan  (COZAAR ) 50 MG tablet, Take 1 tablet (50 mg total) by mouth daily., Disp: 90 tablet, Rfl: 3   ondansetron  (ZOFRAN -ODT) 8 MG disintegrating tablet, Take 1 tablet (8 mg total) by mouth every 6 (six) hours as needed for nausea or vomiting., Disp: 20 tablet, Rfl: 1   pantoprazole  (PROTONIX ) 40 MG tablet, Take 1 tablet (40 mg total) by mouth daily., Disp: 90 tablet, Rfl: 3   Pirfenidone  267 MG TABS, Take 2 tablets (534 mg total) by mouth 3 (three) times daily with meals., Disp: 180 tablet, Rfl: 2   potassium chloride  Tasha [name] (KLOR-CON  M) 20 MEQ tablet, Take 1 tablet (20 mEq total) by mouth daily., Disp: 90 tablet, Rfl: 3      Objective:   Vitals:   03/25/24 1427  BP: 124/70  Pulse: 91  Temp: 98.3 F (36.8 C)  TempSrc: Oral  SpO2: 97%  Weight: 134 lb (60.8 kg)  Height: 5' 0.5 (1.537 m)    Estimated body mass index is 25.74 kg/m as calculated from the following:   Height as of this encounter: 5' 0.5 (1.537 m).   Weight as of this encounter: 134 lb (60.8 kg).  @WEIGHTCHANGE @  [employer]   03/25/24 1427  Weight: 134 lb (60.8 kg)     Physical Exam   General: No distress. Looks well O2 at rest: no Cane present: no Sitting in wheel chair: no Frail: no Obese: no Neuro: Alert and Oriented x 3. GCS 15. Speech normal Psych: Pleasant Resp:  Barrel Chest - no.  Wheeze - no, Crackles - no, No overt respiratory distress CVS: Normal heart sounds. Murmurs - no Ext: Stigmata of Connective Tissue Disease - no HEENT: Normal upper airway. PEERL +.  No post nasal drip        Assessment/     Assessment & Plan Progressive fibrosing interstitial lung disease (HCC)  Encounter for therapeutic drug monitoring  PLAN Patient Instructions     ICD-10-CM   1. Progressive fibrosing interstitial lung disease (HCC)  J84.89 Hepatic function panel    Pulmonary function test    2. Encounter for therapeutic drug monitoring  Z51.81 Hepatic function panel    Pulmonary function test      Stable on history and PFT and exercise test Tolerting low dose esbriet  well   Plan  - continue low dose esbriet  protocol   - have with food  - drink lot of water   Apply sunscreen when you go out - do LFT blood work 03/25/2024   Followup  - spirometry and dlco in 6-8 months - return to see Dr Geronimo 6-8 months; 15 min viit  Xxxxx Estable segn la historia clnica, la prueba de funcin heptica (PFT) y la prueba de esfuerzo. Tolera bien la dosis baja de Esbriet .  Plan: - Continuar con el protocolo de dosis baja de Esbriet . - Tomar con alimentos. - Beber mucha agua. - Aplicar protector solar al salir. - Realizar anlisis de sangre para pruebas de funcin heptica (PFH) el 08/02/2023.  Seguimiento: - Espirometra y diclofenaco en 6-8 meses. - Volver a la consulta del Dr. Geronimo en 6-8 meses; 15 minutos de visita.    FOLLOWUP    Return in about 7 months (around 10/23/2024) for 15 min visit, after Spiro and DLCO, with Dr Geronimo, Face to Face Visit, with any of the [facility].    SIGNATURE    Dr. Dorethia Geronimo, M.D., F.C.C.P,  Pulmonary and Critical Care Medicine Staff Physician, [facility] Director - Interstitial Lung Disease  Program  Pulmonary Fibrosis [facility] at [facility] [place], [place], [address]  Pager: [phone], If no answer or between  15:00h - 7:00h: call [phone] Telephone: [phone]  3:09 PM 03/25/2024

## 2024-03-25 NOTE — Progress Notes (Signed)
 Spirometry and diffusion capacity performed today.

## 2024-03-25 NOTE — Patient Instructions (Addendum)
 ICD-10-CM   1. Progressive fibrosing interstitial lung disease (HCC)  J84.89 Hepatic function panel    Pulmonary function test    2. Encounter for therapeutic drug monitoring  Z51.81 Hepatic function panel    Pulmonary function test      Stable on history and PFT and exercise test Tolerting low dose esbriet  well   Plan  - continue low dose esbriet  protocol   - have with food  - drink lot of water   Apply sunscreen when you go out - do LFT blood work 03/25/2024   Followup  - spirometry and dlco in 6-8 months - return to see Dr Geronimo 6-8 months; 15 min viit  Tasha [name] segn la historia clnica, la prueba de funcin heptica (PFT) y la prueba de esfuerzo. Tolera bien la dosis baja de Esbriet .  Plan: - Continuar con el protocolo de dosis baja de Esbriet . - Tomar con alimentos. - Beber mucha agua. - Aplicar protector solar al salir. - Realizar anlisis de sangre para pruebas de funcin heptica (PFH) el 08/02/2023.  Seguimiento: - Espirometra y diclofenaco en 6-8 meses. - Volver a la consulta del Dr. Geronimo en 6-8 meses; 15 minutos de visita.

## 2024-03-31 DIAGNOSIS — R079 Chest pain, unspecified: Secondary | ICD-10-CM | POA: Diagnosis not present

## 2024-03-31 DIAGNOSIS — R0600 Dyspnea, unspecified: Secondary | ICD-10-CM | POA: Diagnosis not present

## 2024-03-31 DIAGNOSIS — Z20822 Contact with and (suspected) exposure to covid-19: Secondary | ICD-10-CM | POA: Diagnosis not present

## 2024-03-31 DIAGNOSIS — R0602 Shortness of breath: Secondary | ICD-10-CM | POA: Diagnosis not present

## 2024-03-31 DIAGNOSIS — R918 Other nonspecific abnormal finding of lung field: Secondary | ICD-10-CM | POA: Diagnosis not present

## 2024-04-14 ENCOUNTER — Other Ambulatory Visit: Payer: Self-pay | Admitting: Internal Medicine

## 2024-04-14 ENCOUNTER — Other Ambulatory Visit: Payer: Self-pay

## 2024-04-14 DIAGNOSIS — J849 Interstitial pulmonary disease, unspecified: Secondary | ICD-10-CM

## 2024-04-16 ENCOUNTER — Other Ambulatory Visit: Payer: Self-pay | Admitting: Internal Medicine

## 2024-04-16 ENCOUNTER — Other Ambulatory Visit (HOSPITAL_COMMUNITY): Payer: Self-pay

## 2024-04-16 DIAGNOSIS — J849 Interstitial pulmonary disease, unspecified: Secondary | ICD-10-CM

## 2024-04-19 ENCOUNTER — Encounter: Payer: Self-pay | Admitting: Family Medicine

## 2024-04-19 ENCOUNTER — Other Ambulatory Visit: Payer: Self-pay

## 2024-04-19 ENCOUNTER — Ambulatory Visit (INDEPENDENT_AMBULATORY_CARE_PROVIDER_SITE_OTHER): Payer: Self-pay | Admitting: Family Medicine

## 2024-04-19 VITALS — BP 130/73 | HR 87 | Ht 60.5 in | Wt 133.0 lb

## 2024-04-19 DIAGNOSIS — J4520 Mild intermittent asthma, uncomplicated: Secondary | ICD-10-CM | POA: Diagnosis not present

## 2024-04-19 DIAGNOSIS — I10 Essential (primary) hypertension: Secondary | ICD-10-CM

## 2024-04-19 DIAGNOSIS — R7303 Prediabetes: Secondary | ICD-10-CM

## 2024-04-19 DIAGNOSIS — E039 Hypothyroidism, unspecified: Secondary | ICD-10-CM | POA: Diagnosis not present

## 2024-04-19 DIAGNOSIS — E785 Hyperlipidemia, unspecified: Secondary | ICD-10-CM

## 2024-04-19 LAB — LIPID PANEL

## 2024-04-19 LAB — BAYER DCA HB A1C WAIVED: HB A1C (BAYER DCA - WAIVED): 5 % (ref 4.8–5.6)

## 2024-04-19 MED ORDER — POTASSIUM CHLORIDE CRYS ER 20 MEQ PO TBCR: 20.0000 meq | EXTENDED_RELEASE_TABLET | Freq: Every day | ORAL | 3 refills | Status: AC

## 2024-04-19 MED ORDER — PULMICORT FLEXHALER 90 MCG/ACT IN AEPB: INHALATION_SPRAY | RESPIRATORY_TRACT | 1 refills | Status: DC

## 2024-04-19 MED ORDER — HYDROCHLOROTHIAZIDE 25 MG PO TABS: 25.0000 mg | ORAL_TABLET | Freq: Every day | ORAL | 3 refills | Status: AC

## 2024-04-19 MED ORDER — LEVOTHYROXINE SODIUM 50 MCG PO TABS: 50.0000 ug | ORAL_TABLET | Freq: Every day | ORAL | 3 refills | Status: AC

## 2024-04-19 MED ORDER — CETIRIZINE HCL 10 MG PO TABS: 10.0000 mg | ORAL_TABLET | Freq: Every day | ORAL | 3 refills | Status: AC | PRN

## 2024-04-19 MED ORDER — DILTIAZEM HCL ER COATED BEADS 120 MG PO CP24: 120.0000 mg | ORAL_CAPSULE | Freq: Every day | ORAL | 3 refills | Status: AC

## 2024-04-19 MED ORDER — PIRFENIDONE 267 MG PO TABS
534.0000 mg | ORAL_TABLET | Freq: Three times a day (TID) | ORAL | 4 refills | Status: DC
  Filled 2024-04-19 – 2024-04-20 (×2): qty 180, 30d supply, fill #0
  Filled 2024-05-19: qty 180, 30d supply, fill #1

## 2024-04-19 NOTE — Progress Notes (Signed)
 "  BP 130/73   Pulse 87   Ht 5' 0.5 (1.537 m)   Wt 133 lb (60.3 kg)   SpO2 96%   BMI 25.55 kg/m    Subjective:   [name], female    DOB: 1950-08-11, 73 y.o.   MRN: [id number]  HPI: Tasha [name] is a 73 y.o. female presenting on 04/19/2024 for Medical Management of Chronic Issues, Prediabetes, Hyperlipidemia, and Hypertension   Discussed the use of AI scribe software for clinical note transcription with the patient, who gave verbal consent to proceed.  History of Present Illness   Tasha [name] is a 73 year old female who presents for follow-up on her prediabetes and thyroid  function.  Thyroid  function and levothyroxine  therapy - Currently taking levothyroxine  for thyroid  dysfunction. - No symptoms related to thyroid  dysfunction. - No thyroid  nodules or pain. - No leg swelling or sudden pain. - Medication has been effective.  Anxiety and panic attack - Severe anxiety and panic attack occurred between December 9th and 10th. - Symptoms included feeling 'desperate', 'distracted', 'very sleepy', strong palpitations, and elevated blood pressure up to 194. - Unable to speak or concentrate during episode. - Emergency services contacted; hospital evaluation showed normal heart and lung function. - Diagnosed with panic attack related to personal issue with husband.  Neurological symptoms post-panic attack - Three days after panic attack, experienced numbness in arm and pain in eye, initially attributed to cold exposure. - Severe headache and numbness in hand, with pain radiating to leg. - Symptoms managed with massages and exercises. - Symptoms have resolved.  Respiratory symptoms and inhaler access - Has run out of Flovent  inhaler due to lack of insurance coverage. - Seeking alternative inhaler that is covered by insurance.  Chronic leg pain - Intermittent leg pain attributed to previous medical procedure affecting nerves. - Pain exacerbated by cold  weather. - Pain improves with walking and exercise.          Relevant past medical, surgical, family and social history reviewed and updated as indicated. Interim medical history since our last visit reviewed. Allergies and medications reviewed and updated.  Review of Systems  Constitutional:  Negative for chills and fever.  HENT:  Negative for congestion, ear discharge and ear pain.   Eyes:  Negative for redness and visual disturbance.  Respiratory:  Negative for chest tightness and shortness of breath.   Cardiovascular:  Negative for chest pain and leg swelling.  Genitourinary:  Negative for difficulty urinating and dysuria.  Musculoskeletal:  Positive for arthralgias and myalgias. Negative for back pain and gait problem.  Skin:  Negative for rash.  Neurological:  Negative for dizziness, light-headedness and headaches.  Psychiatric/Behavioral:  Negative for agitation and behavioral problems. The patient is nervous/anxious.   All other systems reviewed and are negative.   Per HPI unless specifically indicated above   Allergies as of 04/19/2024       Reactions   Hydromet [hydrocodone  Bit-homatrop Mbr]    Pt doesn't remember   Ofev  [nintedanib] Diarrhea   Toprol  Xl [metoprolol ] Hives   Facial rash, itching, hives   Amoxicillin Rash   Did it involve swelling of the face/tongue/throat, SOB, or low BP? No Did it involve sudden or severe rash/hives, skin peeling, or any reaction on the inside of your mouth or nose? Unknown Did you need to seek medical attention at a hospital or doctor's office? Unknown When did it last happen?      10 + years If all above  answers are NO, may proceed with cephalosporin use.        Medication List        Accurate as of April 19, 2024  2:26 PM. If you have any questions, ask your nurse or doctor.          STOP taking these medications    fluticasone  110 MCG/ACT inhaler Commonly known as: FLOVENT  HFA Stopped by: Fonda Levins, MD       TAKE these medications    acetaminophen  500 MG tablet Commonly known as: TYLENOL  Take 1 tablet (500 mg total) by mouth every 6 (six) hours as needed.   atorvastatin  20 MG tablet Commonly known as: LIPITOR Take 1 tablet (20 mg total) by mouth daily.   cetirizine  10 MG tablet Commonly known as: ZYRTEC  Take 1 tablet (10 mg total) by mouth daily as needed for allergies.   diltiazem  120 MG 24 hr capsule Commonly known as: CARDIZEM  CD Take 1 capsule (120 mg total) by mouth daily.   Eliquis  5 MG Tabs tablet Generic drug: apixaban  Take 1 tablet by mouth twice daily   hydrochlorothiazide  25 MG tablet Commonly known as: HYDRODIURIL  Take 1 tablet (25 mg total) by mouth daily.   ibuprofen 200 MG tablet Commonly known as: ADVIL Take 400 mg by mouth every 6 (six) hours as needed for headache or moderate pain.   ipratropium-albuterol  0.5-2.5 (3) MG/3ML Soln Commonly known as: DUONEB Take 3 mLs by nebulization every 6 (six) hours as needed.   levothyroxine  50 MCG tablet Commonly known as: SYNTHROID  Take 1 tablet (50 mcg total) by mouth daily before breakfast.   losartan  50 MG tablet Commonly known as: COZAAR  Take 1 tablet (50 mg total) by mouth daily.   ondansetron  8 MG disintegrating tablet Commonly known as: ZOFRAN -ODT Take 1 tablet (8 mg total) by mouth every 6 (six) hours as needed for nausea or vomiting.   pantoprazole  40 MG tablet Commonly known as: PROTONIX  Take 1 tablet (40 mg total) by mouth daily.   Pirfenidone  267 MG Tabs Take 2 tablets (534 mg total) by mouth 3 (three) times daily with meals.   potassium chloride  SA 20 MEQ tablet Commonly known as: KLOR-CON  M Take 1 tablet (20 mEq total) by mouth daily.   Pulmicort  Flexhaler 90 MCG/ACT inhaler Generic drug: Budesonide  1 inhalation 2 times daily Started by: Fonda Levins, MD         Objective:   BP 130/73   Pulse 87   Ht 5' 0.5 (1.537 m)   Wt 133 lb (60.3 kg)   SpO2 96%    BMI 25.55 kg/m   Wt Readings from Last 3 Encounters:  04/19/24 133 lb (60.3 kg)  03/25/24 134 lb (60.8 kg)  03/05/24 135 lb (61.2 kg)    Physical Exam Physical Exam   VITALS: BP- 130/73 NECK: Thyroid  normal, no nodules. CHEST: Lungs clear to auscultation bilaterally. CARDIOVASCULAR: Regular rate and rhythm, normal heart sounds.         Assessment & Plan:   Problem List Items Addressed This Visit       Cardiovascular and Mediastinum   Hypertension (Chronic)   Relevant Medications   diltiazem  (CARDIZEM  CD) 120 MG 24 hr capsule   hydrochlorothiazide  (HYDRODIURIL ) 25 MG tablet   Other Relevant Orders   Bayer DCA Hb A1c Waived   CBC with Differential/Platelet   CMP14+EGFR   Lipid panel     Respiratory   Allergy-induced asthma   Relevant Medications   Budesonide  (PULMICORT  FLEXHALER) 90  MCG/ACT inhaler     Endocrine   Hypothyroidism   Relevant Medications   levothyroxine  (SYNTHROID ) 50 MCG tablet   Other Relevant Orders   TSH     Other   Hyperlipidemia with target LDL less than 100 - Primary (Chronic)   Relevant Medications   diltiazem  (CARDIZEM  CD) 120 MG 24 hr capsule   hydrochlorothiazide  (HYDRODIURIL ) 25 MG tablet   Other Relevant Orders   Bayer DCA Hb A1c Waived   CBC with Differential/Platelet   CMP14+EGFR   Lipid panel   Prediabetes   Relevant Orders   Bayer DCA Hb A1c Waived   CBC with Differential/Platelet   CMP14+EGFR   Lipid panel   Other Visit Diagnoses       Essential hypertension       Relevant Medications   diltiazem  (CARDIZEM  CD) 120 MG 24 hr capsule   hydrochlorothiazide  (HYDRODIURIL ) 25 MG tablet          Prediabetes Follow-up for prediabetes management. - Checked blood sugar levels.  Acquired hypothyroidism Well-managed on levothyroxine  with no symptoms reported. - Checked thyroid  function tests.  Primary hypertension Blood pressure is well-controlled at 130/73 mmHg on current medication regimen.  Panic attack Recent  panic attack in December due to stress. Hospital evaluation ruled out cardiac and pulmonary issues. Symptoms have not recurred. - Advised to seek emergency care if symptoms recur. - Will consider future discussion of anxiety management if attacks become frequent.  Asthma Inhaler (Fluvent) not covered by insurance. - Prescribed alternative inhaler.  Chronic pain, left leg Chronic left leg pain exacerbated by cold weather, improves with exercise and movement. - Continue exercise and movement to manage pain.  General Health Maintenance Due for mammogram in February and DEXA scan for bone health. - Will schedule mammogram in February. - Will schedule DEXA scan.          Follow up plan: Return in about 3 months (around 07/18/2024), or if symptoms worsen or fail to improve, for Prediabetes and thyroid .  Counseling provided for all of the vaccine components Orders Placed This Encounter  Procedures   Bayer DCA Hb A1c Waived   CBC with Differential/Platelet   CMP14+EGFR   Lipid panel   TSH    Fonda Levins, MD [facility] 04/19/2024, 2:26 PM     "

## 2024-04-19 NOTE — Telephone Encounter (Signed)
 Refill sent for ESBRIET  to [facility]: [phone]   Dose: 534mg  by mouth three times daily   Last OV: 03/25/24 Provider: Dr. Geronimo Pertinent labs: 03/31/24 LFTs wnl, obtained at outside facility   Next OV: June to August 2026  Aleck Puls, PharmD, BCPS Clinical Pharmacist  [facility]

## 2024-04-20 ENCOUNTER — Other Ambulatory Visit: Payer: Self-pay

## 2024-04-20 LAB — CMP14+EGFR
ALT: 19 IU/L (ref 0–32)
AST: 24 IU/L (ref 0–40)
Albumin: 4.3 g/dL (ref 3.8–4.8)
Alkaline Phosphatase: 59 IU/L (ref 49–135)
BUN/Creatinine Ratio: 27 (ref 12–28)
BUN: 19 mg/dL (ref 8–27)
Bilirubin Total: <0.2 mg/dL (ref 0.0–1.2)
CO2: 23 mmol/L (ref 20–29)
Calcium: 9.3 mg/dL (ref 8.7–10.3)
Chloride: 102 mmol/L (ref 96–106)
Creatinine, Ser: 0.7 mg/dL (ref 0.57–1.00)
Globulin, Total: 2.1 g/dL (ref 1.5–4.5)
Glucose: 96 mg/dL (ref 70–99)
Potassium: 3.9 mmol/L (ref 3.5–5.2)
Sodium: 141 mmol/L (ref 134–144)
Total Protein: 6.4 g/dL (ref 6.0–8.5)
eGFR: 91 mL/min/1.73

## 2024-04-20 LAB — LIPID PANEL
Cholesterol, Total: 149 mg/dL (ref 100–199)
HDL: 42 mg/dL
LDL CALC COMMENT:: 3.5 ratio (ref 0.0–4.4)
LDL Chol Calc (NIH): 74 mg/dL (ref 0–99)
Triglycerides: 195 mg/dL — AB (ref 0–149)
VLDL Cholesterol Cal: 33 mg/dL (ref 5–40)

## 2024-04-20 LAB — CBC WITH DIFFERENTIAL/PLATELET
Basophils Absolute: 0 x10E3/uL (ref 0.0–0.2)
Basos: 0 %
EOS (ABSOLUTE): 0.2 x10E3/uL (ref 0.0–0.4)
Eos: 3 %
Hematocrit: 39.1 % (ref 34.0–46.6)
Hemoglobin: 13.1 g/dL (ref 11.1–15.9)
Immature Grans (Abs): 0 x10E3/uL (ref 0.0–0.1)
Immature Granulocytes: 0 %
Lymphocytes Absolute: 1.5 x10E3/uL (ref 0.7–3.1)
Lymphs: 18 %
MCH: 31.3 pg (ref 26.6–33.0)
MCHC: 33.5 g/dL (ref 31.5–35.7)
MCV: 94 fL (ref 79–97)
Monocytes Absolute: 1.1 x10E3/uL — ABNORMAL HIGH (ref 0.1–0.9)
Monocytes: 13 %
Neutrophils Absolute: 5.4 x10E3/uL (ref 1.4–7.0)
Neutrophils: 66 %
Platelets: 138 x10E3/uL — ABNORMAL LOW (ref 150–450)
RBC: 4.18 x10E6/uL (ref 3.77–5.28)
RDW: 12.9 % (ref 11.7–15.4)
WBC: 8.3 x10E3/uL (ref 3.4–10.8)

## 2024-04-20 LAB — TSH: TSH: 2.18 u[IU]/mL (ref 0.450–4.500)

## 2024-04-20 NOTE — Progress Notes (Signed)
 Specialty Pharmacy Refill Coordination Note  Tasha [name] is a 73 y.o. female contacted today regarding refills of specialty medication(s) Pirfenidone    Patient requested Delivery   Delivery date: 04/23/24   Verified address: [address]  [place] [address]   Medication will be filled on: 04/21/24

## 2024-04-21 ENCOUNTER — Other Ambulatory Visit: Payer: Self-pay

## 2024-04-28 ENCOUNTER — Ambulatory Visit: Payer: Self-pay | Admitting: Family Medicine

## 2024-05-06 ENCOUNTER — Encounter (INDEPENDENT_AMBULATORY_CARE_PROVIDER_SITE_OTHER): Payer: Self-pay | Admitting: *Deleted

## 2024-05-14 ENCOUNTER — Other Ambulatory Visit (HOSPITAL_COMMUNITY): Payer: Self-pay

## 2024-05-18 ENCOUNTER — Other Ambulatory Visit: Payer: Self-pay | Admitting: Family Medicine

## 2024-05-18 DIAGNOSIS — J4520 Mild intermittent asthma, uncomplicated: Secondary | ICD-10-CM

## 2024-05-19 ENCOUNTER — Other Ambulatory Visit: Payer: Self-pay | Admitting: Family Medicine

## 2024-05-19 ENCOUNTER — Other Ambulatory Visit: Payer: Self-pay

## 2024-05-19 MED ORDER — ARNUITY ELLIPTA 100 MCG/ACT IN AEPB: 1.0000 | INHALATION_SPRAY | Freq: Every day | RESPIRATORY_TRACT | 5 refills | Status: AC

## 2024-05-19 NOTE — Progress Notes (Signed)
 Patient made aware by myself and cynthia at front who was able to translate message for patient in spanish.

## 2024-05-19 NOTE — Progress Notes (Unsigned)
 Sent Arnu[employer] for the patient because they said the Pulmicort  would not be covered

## 2024-05-19 NOTE — Addendum Note (Signed)
 Addended by: SHAREN DELON HERO on: 05/19/2024 04:37 PM   Modules accepted: Orders

## 2024-05-21 ENCOUNTER — Other Ambulatory Visit: Payer: Self-pay

## 2024-05-21 ENCOUNTER — Other Ambulatory Visit: Payer: Self-pay | Admitting: Pharmacy Technician

## 2024-05-21 ENCOUNTER — Telehealth: Payer: Self-pay

## 2024-05-21 ENCOUNTER — Ambulatory Visit: Attending: Internal Medicine

## 2024-05-21 ENCOUNTER — Other Ambulatory Visit (HOSPITAL_COMMUNITY): Payer: Self-pay

## 2024-05-21 DIAGNOSIS — Z79899 Other long term (current) drug therapy: Secondary | ICD-10-CM

## 2024-05-21 DIAGNOSIS — J849 Interstitial pulmonary disease, unspecified: Secondary | ICD-10-CM

## 2024-05-21 MED ORDER — PIRFENIDONE 267 MG PO TABS
534.0000 mg | ORAL_TABLET | Freq: Three times a day (TID) | ORAL | 3 refills | Status: AC
  Filled 2024-05-21: qty 180, 30d supply, fill #0

## 2024-05-21 NOTE — Telephone Encounter (Signed)
 Copied from CRM #[id number]. Topic: Clinical - Prescription Issue >> May 20, 2024 11:41 AM Corean SAUNDERS wrote: Reason for CRM: Patients son states patients pharmacy advised them that her Pulmicort  is not longer on the preferred medication list with her insurance and is requesting an alternative to be sent to the preferred pharmacy on file.    Please call patient back to advise.    Spoke with patient per spanish interpreter   patient son will reach out to pharmacy or insurance to find out what the prefer list

## 2024-05-21 NOTE — Progress Notes (Signed)
 [facility]  Referring Provider: Dorethia Cave  Virtual Visit via Telephone Note  I connected with Tasha [name] on 05/21/24 at 10:00 AM EST by telephone and verified that I am speaking with the correct person using two identifiers. Call was completed with [facility] Interpreter ID [phone].   Location: Patient: home Provider: office   I discussed the limitations, risks, security and privacy concerns of performing an evaluation and management service by telephone and the availability of in person appointments. I also discussed with the patient that there may be a patient responsible charge related to this service. The patient expressed understanding and agreed to proceed.   HPI: Tasha [name] is a 74 y.o. female who presents to the pharmacotherapy clinic via telephone for continuation of therapy with Pirfenidone  for Interstitial Lung Disease.  Patient Active Problem List   Diagnosis Date Noted   ERRONEOUS ENCOUNTER--DISREGARD 02/28/2023   Abdominal pain, right upper quadrant 07/09/2022   Coronary artery calcification 03/05/2022   Non-cardiac chest pain 03/05/2022   Chronic pain of both shoulders 11/25/2018   Hypothyroidism 03/16/2018   PAF (paroxysmal atrial fibrillation) (HCC) 01/13/2018   ILD (interstitial lung disease) (HCC)    Prediabetes    Persistent atrial fibrillation (HCC)    Metabolic syndrome    BMI 25.0-25.9,adult 02/20/2016   Allergy-induced asthma 08/10/2014   Gastroesophageal reflux disease without esophagitis 08/10/2014   Hyperlipidemia with target LDL less than 100 05/04/2013   Hypertension 09/03/2012    Patient's Medications  New Prescriptions   No medications on file  Previous Medications   ACETAMINOPHEN  (TYLENOL ) 500 MG TABLET    Take 1 tablet (500 mg total) by mouth every 6 (six) hours as needed.   APIXABAN  (ELIQUIS ) 5 MG TABS TABLET    Take 1 tablet by mouth twice daily   ATORVASTATIN  (LIPITOR) 20 MG TABLET    Take 1 tablet (20  mg total) by mouth daily.   CETIRIZINE  (ZYRTEC ) 10 MG TABLET    Take 1 tablet (10 mg total) by mouth daily as needed for allergies.   DILTIAZEM  (CARDIZEM  CD) 120 MG 24 HR CAPSULE    Take 1 capsule (120 mg total) by mouth daily.   FLUTICASONE  FUROATE (ARNUITY ELLIPTA ) 100 MCG/ACT AEPB    Inhale 1 Inhalation into the lungs daily at 12 noon.   HYDROCHLOROTHIAZIDE  (HYDRODIURIL ) 25 MG TABLET    Take 1 tablet (25 mg total) by mouth daily.   IBUPROFEN (ADVIL) 200 MG TABLET    Take 400 mg by mouth every 6 (six) hours as needed for headache or moderate pain.   IPRATROPIUM-ALBUTEROL  (DUONEB) 0.5-2.5 (3) MG/3ML SOLN    Take 3 mLs by nebulization every 6 (six) hours as needed.   LEVOTHYROXINE  (SYNTHROID ) 50 MCG TABLET    Take 1 tablet (50 mcg total) by mouth daily before breakfast.   LOSARTAN  (COZAAR ) 50 MG TABLET    Take 1 tablet (50 mg total) by mouth daily.   ONDANSETRON  (ZOFRAN -ODT) 8 MG DISINTEGRATING TABLET    Take 1 tablet (8 mg total) by mouth every 6 (six) hours as needed for nausea or vomiting.   PANTOPRAZOLE  (PROTONIX ) 40 MG TABLET    Take 1 tablet (40 mg total) by mouth daily.   POTASSIUM CHLORIDE  SA (KLOR-CON  M) 20 MEQ TABLET    Take 1 tablet (20 mEq total) by mouth daily.  Modified Medications   Modified Medication Previous Medication   PIRFENIDONE  267 MG TABS Pirfenidone  267 MG TABS      Take 2 tablets (534  mg total) by mouth 3 (three) times daily with meals.    Take 2 tablets (534 mg total) by mouth 3 (three) times daily with meals.  Discontinued Medications   No medications on file    Allergies: Allergies[1]  Past Medical History: Past Medical History:  Diagnosis Date   Acute respiratory failure (HCC)    CAP (community acquired pneumonia)    Hyperlipidemia    Hypertension    Metabolic syndrome     Social History: Social History   Socioeconomic History   Marital status: Married    Spouse name: Ramiro   Number of children: 3   Years of education: 6   Highest education  level: 6th grade  Occupational History   Occupation: retired  Tobacco Use   Smoking status: Never    Passive exposure: Past   Smokeless tobacco: Never  Vaping Use   Vaping status: Never Used  Substance and Sexual Activity   Alcohol use: No   Drug use: No   Sexual activity: Not Currently    Birth control/protection: Post-menopausal  Other Topics Concern   Not on file  Social History Narrative   Lives home with her husband - active in church - children live nearby   Social Drivers of Health   Tobacco Use: Low Risk (04/19/2024)   Patient History    Smoking Tobacco Use: Never    Smokeless Tobacco Use: Never    Passive Exposure: Past  Financial Resource Strain: Low Risk (02/26/2023)   Overall Financial Resource Strain (CARDIA)    Difficulty of Paying Living Expenses: Not hard at all  Food Insecurity: No Food Insecurity (03/05/2024)   Epic    Worried About Programme Researcher, Broadcasting/film/video in the Last Year: Never true    Ran Out of Food in the Last Year: Never true  Transportation Needs: No Transportation Needs (03/05/2024)   Epic    Lack of Transportation (Medical): No    Lack of Transportation (Non-Medical): No  Physical Activity: Insufficiently Active (03/05/2024)   Exercise Vital Sign    Days of Exercise per Week: 3 days    Minutes of Exercise per Session: 30 min  Stress: No Stress Concern Present (03/05/2024)   [employer] of Occupational Health - Occupational Stress Questionnaire    Feeling of Stress: Not at all  Social Connections: Socially Integrated (03/05/2024)   Social Connection and Isolation Panel    Frequency of Communication with Friends and Family: More than three times a week    Frequency of Social Gatherings with Friends and Family: More than three times a week    Attends Religious Services: More than 4 times per year    Active Member of Clubs or Organizations: Yes    Attends Banker Meetings: More than 4 times per year    Marital Status: Married   Depression (PHQ2-9): Low Risk (04/19/2024)   Depression (PHQ2-9)    PHQ-2 Score: 0  Alcohol Screen: Low Risk (02/26/2023)   Alcohol Screen    Last Alcohol Screening Score (AUDIT): 0  Housing: Unknown (03/05/2024)   Epic    Unable to Pay for Housing in the Last Year: No    Number of Times Moved in the Last Year: Not on file    Homeless in the Last Year: No  Utilities: Not At Risk (03/05/2024)   Epic    Threatened with loss of utilities: No  Health Literacy: Inadequate Health Literacy (03/05/2024)   B1300 Health Literacy    Frequency of need for help  with medical instructions: Always    Medication: Pirfenidone  267mg  - 2 tablets (534mg ) 3 times daily  Assessment and Plan  Esbriet  Medication Management Thoroughly counseled patient on the efficacy, mechanism of action, dosing, administration, adverse effects, and monitoring parameters of Esbriet .  Patient verbalized understanding.   Goals of Therapy: Will not stop or reverse the progression of ILD. It will slow the progression of ILD.   Dosing: Starting dose will be Esbriet  267 mg 1 tablet three times daily for 7 days, then 2 tablets three times daily for 7 days, then 3 tablets three times daily.  Maintenance dose will be 801 mg 1 tablet three times daily if tolerated.  Stressed the importance of taking with meals and space at least 5-6 hours apart to minimize stomach upset.   Adverse Effects: Nausea, vomiting, diarrhea, weight loss Abdominal pain GERD Sun sensitivity/rash - patient advised to wear sunscreen when exposed to sunlight Dizziness Fatigue  Monitoring: Monitor for diarrhea, nausea and vomiting, GI perforation, hepatotoxicity  Monitor LFTs - baseline, monthly for first 6 months, then every 3 months routinely Pregnancy test - baseline prn CBC w differential at baseline and every 3 months routinely  Medication Reconciliation A drug regimen assessment was performed, including review of allergies, interactions,  disease-state management, dosing and immunization history. Medications were reviewed with the patient, including name, instructions, indication, goals of therapy, potential side effects, importance of adherence, and safe use.   I discussed the assessment and treatment plan with the patient. The patient was provided an opportunity to ask questions and all were answered. The patient agreed with the plan and demonstrated an understanding of the instructions.   The patient was advised to call back or seek an in-person evaluation if the symptoms worsen or if the condition fails to improve as anticipated.  I provided 15 minutes of non-face-to-face time during this encounter.  Delon Brow, PharmD, CSP, AAHIVP, CPP Clinical Pharmacist Practitioner - Medication Therapy Disease Management/Specialty Pharmacy Services 05/21/2024, 10:15 AM     [1]  Allergies Allergen Reactions   Hydromet [Hydrocodone  Bit-Homatrop Mbr]     Pt doesn't remember   Ofev  [Nintedanib] Diarrhea   Toprol  Xl [Metoprolol ] Hives    Facial rash, itching, hives   Amoxicillin Rash    Did it involve swelling of the face/tongue/throat, SOB, or low BP? No Did it involve sudden or severe rash/hives, skin peeling, or any reaction on the inside of your mouth or nose? Unknown Did you need to seek medical attention at a hospital or doctor's office? Unknown When did it last happen?      10 + years If all above answers are NO, may proceed with cephalosporin use.

## 2024-05-21 NOTE — Progress Notes (Signed)
 Specialty Pharmacy Refill Coordination Note  Najia Hurlbutt is a 74 y.o. female contacted today regarding refills of specialty medication(s) Pirfenidone    Patient requested Delivery   Delivery date: 05/26/24   Verified address: [address]  [place] [address]   Medication will be filled on: 05/25/24   05/21/2024 Used language interpreter line [phone])  agent number [phone] for this conversation.[email or web address]

## 2024-05-24 ENCOUNTER — Other Ambulatory Visit (HOSPITAL_COMMUNITY): Payer: Self-pay

## 2024-05-24 ENCOUNTER — Other Ambulatory Visit: Payer: Self-pay

## 2024-05-25 ENCOUNTER — Other Ambulatory Visit: Payer: Self-pay

## 2024-06-16 ENCOUNTER — Encounter

## 2024-07-19 ENCOUNTER — Ambulatory Visit: Admitting: Family Medicine
